# Patient Record
Sex: Male | Born: 1945 | Race: Black or African American | Hispanic: No | Marital: Married | Smoking: Former smoker
Health system: Southern US, Community
[De-identification: ages and names within clinical notes are randomized; demographics above are authoritative.]

## PROBLEM LIST (undated history)

## (undated) DIAGNOSIS — E663 Overweight: Secondary | ICD-10-CM

## (undated) DIAGNOSIS — N189 Chronic kidney disease, unspecified: Secondary | ICD-10-CM

## (undated) DIAGNOSIS — M109 Gout, unspecified: Secondary | ICD-10-CM

## (undated) DIAGNOSIS — I10 Essential (primary) hypertension: Secondary | ICD-10-CM

## (undated) DIAGNOSIS — M1990 Unspecified osteoarthritis, unspecified site: Secondary | ICD-10-CM

## (undated) DIAGNOSIS — E119 Type 2 diabetes mellitus without complications: Secondary | ICD-10-CM

## (undated) DIAGNOSIS — I428 Other cardiomyopathies: Secondary | ICD-10-CM

## (undated) DIAGNOSIS — E7800 Pure hypercholesterolemia, unspecified: Secondary | ICD-10-CM

## (undated) DIAGNOSIS — C801 Malignant (primary) neoplasm, unspecified: Secondary | ICD-10-CM

## (undated) HISTORY — DX: Pure hypercholesterolemia, unspecified: E78.00

## (undated) HISTORY — PX: HERNIA REPAIR: SHX51

## (undated) HISTORY — DX: Essential (primary) hypertension: I10

## (undated) HISTORY — DX: Other cardiomyopathies: I42.8

## (undated) HISTORY — DX: Overweight: E66.3

## (undated) HISTORY — DX: Gout, unspecified: M10.9

## (undated) HISTORY — DX: Type 2 diabetes mellitus without complications: E11.9

## (undated) HISTORY — PX: COLONOSCOPY: SHX174

## (undated) SURGERY — Surgical Case
Anesthesia: *Unknown

---

## 2003-05-02 ENCOUNTER — Encounter: Payer: Self-pay | Admitting: Cardiology

## 2003-05-14 ENCOUNTER — Ambulatory Visit (HOSPITAL_COMMUNITY): Admission: RE | Admit: 2003-05-14 | Discharge: 2003-05-15 | Payer: Self-pay | Admitting: Cardiology

## 2003-08-22 ENCOUNTER — Encounter: Payer: Self-pay | Admitting: Cardiology

## 2005-07-09 ENCOUNTER — Ambulatory Visit: Payer: Self-pay | Admitting: Cardiology

## 2005-07-19 ENCOUNTER — Ambulatory Visit: Payer: Self-pay | Admitting: Cardiology

## 2006-09-30 ENCOUNTER — Encounter: Payer: Self-pay | Admitting: Physician Assistant

## 2006-09-30 ENCOUNTER — Ambulatory Visit: Payer: Self-pay | Admitting: Cardiology

## 2006-10-17 ENCOUNTER — Ambulatory Visit: Payer: Self-pay | Admitting: Cardiology

## 2007-06-02 ENCOUNTER — Ambulatory Visit: Payer: Self-pay | Admitting: Cardiology

## 2008-08-05 ENCOUNTER — Encounter: Payer: Self-pay | Admitting: Cardiology

## 2008-09-20 ENCOUNTER — Ambulatory Visit: Payer: Self-pay | Admitting: Cardiology

## 2009-08-08 DIAGNOSIS — E663 Overweight: Secondary | ICD-10-CM | POA: Insufficient documentation

## 2009-08-08 DIAGNOSIS — I429 Cardiomyopathy, unspecified: Secondary | ICD-10-CM | POA: Insufficient documentation

## 2009-08-08 DIAGNOSIS — I10 Essential (primary) hypertension: Secondary | ICD-10-CM | POA: Insufficient documentation

## 2009-12-26 ENCOUNTER — Ambulatory Visit: Payer: Self-pay | Admitting: Cardiology

## 2009-12-26 DIAGNOSIS — E119 Type 2 diabetes mellitus without complications: Secondary | ICD-10-CM | POA: Insufficient documentation

## 2009-12-29 ENCOUNTER — Telehealth (INDEPENDENT_AMBULATORY_CARE_PROVIDER_SITE_OTHER): Payer: Self-pay | Admitting: *Deleted

## 2010-05-01 ENCOUNTER — Ambulatory Visit: Payer: Self-pay | Admitting: Cardiology

## 2010-12-08 NOTE — Assessment & Plan Note (Signed)
Summary: 6 MONTH FU   Visit Type:  Follow-up Primary Provider:  Manuella Ghazi   History of Present Illness: the patient is a 65 year old male with a history of nonischemic cardio myopathy. His ejection fraction in 2009 was 45%. He has had no heart failure exacerbations. He reports no orthopnea PND palpitations or syncope. He has lost 9 pounds. He denies any central chest pain both at rest and on exertion. He has followup scheduled with his primary care physician next month.  Preventive Screening-Counseling & Management  Alcohol-Tobacco     Smoking Status: quit     Year Quit: 1987  Current Medications (verified): 1)  Spironolactone 25 Mg Tabs (Spironolactone) .... Take 1 Tablet By Mouth Once A Day 2)  Amlodipine Besy-Benazepril Hcl 10-20 Mg Caps (Amlodipine Besy-Benazepril Hcl) .... Take 1 Capsule By Mouth Once A Day 3)  Bumetanide 1 Mg Tabs (Bumetanide) .... Take 1 Tablet By Mouth Twice A Day 4)  Carvedilol 25 Mg Tabs (Carvedilol) .... Take 1 Tablet By Mouth Twice A Day 5)  Metformin Hcl 500 Mg Tabs (Metformin Hcl) .... Take 1 Tablet By Mouth Two Times A Day 6)  Aspir-Low 81 Mg Tbec (Aspirin) .... Take 1 Tablet By Mouth Once A Day 7)  Advair Diskus 250-50 Mcg/dose Aepb (Fluticasone-Salmeterol) .... One Inhalation Two Times A Day 8)  Simvastatin 20 Mg Tabs (Simvastatin) .... Take 1 Tablet By Mouth Once A Day  Allergies (verified): No Known Drug Allergies  Comments:  Nurse/Medical Assistant: The patient's medication list and allergies were reviewed with the patient and were updated in the Medication and Allergy Lists.  Past History:  Past Medical History: Last updated: 12/26/2009 OVERWEIGHT/OBESITY (ICD-278.02) HYPERTENSION, UNSPECIFIED (ICD-401.9) CARDIOMYOPATHY, SECONDARY (ICD-425.9) Type 2 diabetes mellitus 1. Nonischemic cardiomyopathy.     a.     EF 40-50% by most recent 2-D echo.     b.     EF 25% by cardiac catheterization in 2004. 2. Hypertension. 3. Type 2 diabetes  mellitus. 4. Obesity  Family History: Last updated: 12/26/2009 [place]  Social History: Last updated: 08/08/2009 Full Time Married  Tobacco Use - Former.  Alcohol Use - no  Risk Factors: Smoking Status: quit (05/01/2010)  Vital Signs:  Patient profile:   65 year old male Height:      74 inches Weight:      309 pounds Pulse rate:   73 / minute BP sitting:   121 / 75  (left arm) Cuff size:   large  Vitals Entered By: Georgina Peer (May 01, 2010 1:49 PM)  Physical Exam  Additional Exam:  General: Well-developed, well-nourished in no distress head: Normocephalic and atraumatic eyes PERRLA/EOMI intact, conjunctiva and lids normal nose: No deformity or lesions mouth normal dentition, normal posterior pharynx neck: Supple, no JVD.  No masses, thyromegaly or abnormal cervical nodes lungs: Normal breath sounds bilaterally without wheezing.  Normal percussion heart: regular rate and rhythm with normal S1 and S2, no S3 or S4.  PMI is normal.  No pathological murmurs abdomen: Normal bowel sounds, abdomen is soft and nontender without masses, organomegaly or hernias noted.  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  Problem # 2:  HYPERTENSION, UNSPECIFIED (ICD-401.9) blood pressure is well controlled. I made no changes in his medical regimen. The following medications were removed from the medication list:    Lotrel 10-20 Mg Caps (Amlodipine besy-benazepril hcl) .Marland Kitchen... Take 1 tablet by mouth once a day His updated medication  list for this problem includes:    Spironolactone 25 Mg Tabs (Spironolactone) .Marland Kitchen... Take 1 tablet by mouth once a day    Amlodipine Besy-benazepril Hcl 10-20 Mg Caps (Amlodipine besy-benazepril hcl) .Marland Kitchen... Take 1 capsule by mouth once a day    Bumetanide 1 Mg Tabs (Bumetanide) .Marland Kitchen... Take 1 tablet by mouth twice a day    Carvedilol 25 Mg Tabs (Carvedilol) .Marland Kitchen... Take 1 tablet by mouth twice a day    Aspir-low 81 Mg Tbec (Aspirin) .Marland Kitchen... Take 1 tablet by mouth once a day  Problem # 3:  OVERWEIGHT/OBESITY (ICD-278.02) the patient lost 9 pounds.I encouraged her to weight reduction.  Problem # 4:  DM (ICD-250.00) Assessment: Comment Only  The following medications were removed from the medication list:    Lotrel 10-20 Mg Caps (Amlodipine besy-benazepril hcl) .Marland Kitchen... Take 1 tablet by mouth once a day His updated medication list for this problem includes:    Amlodipine Besy-benazepril Hcl 10-20 Mg Caps (Amlodipine besy-benazepril hcl) .Marland Kitchen... Take 1 capsule by mouth once a day    Metformin Hcl 500 Mg Tabs (Metformin hcl) .Marland Kitchen... Take 1 tablet by mouth two times a day    Aspir-low 81 Mg Tbec (Aspirin) .Marland Kitchen... Take 1 tablet by mouth once a day  Patient Instructions: 1)  Your physician recommends that you continue on your current medications as directed. Please refer to the Current Medication list given to you today. 2)  Follow up in  1 year Prescriptions: CARVEDILOL 25 MG TABS (CARVEDILOL) Take 1 tablet by mouth twice a day  #180 x 3   Entered by:   Lovina Reach, LPN   Authorized by:   Terald Sleeper, MD, [facility]   Signed by:   Lovina Reach, LPN on QA348G   Method used:   Electronically to        [facility] (retail)             ,          Ph: [phone]       Fax: [phone]   [id number] BUMETANIDE 1 MG TABS (BUMETANIDE) Take 1 tablet by mouth twice a day  #180 x 3   Entered by:   Lovina Reach, LPN   Authorized by:   Terald Sleeper, MD, [facility]   Signed by:   Lovina Reach, LPN on QA348G    Method used:   Electronically to        [facility] (retail)             ,          Ph: [phone]       Fax: [phone]   RxIDSG:[id number] AMLODIPINE BESY-BENAZEPRIL HCL 10-20 MG CAPS (AMLODIPINE BESY-BENAZEPRIL HCL) Take 1 capsule by mouth once a day  #90 x 3   Entered by:   Lovina Reach, LPN   Authorized by:   Terald Sleeper, MD, [facility]   Signed by:   Lovina Reach, LPN on QA348G   Method used:   Electronically to        [facility]* (retail)             ,  Ph: [phone]       Fax: [phone]   [id number] SPIRONOLACTONE 25 MG TABS (SPIRONOLACTONE) Take 1 tablet by mouth once a day  #90 x 3   Entered by:   Lovina Reach, LPN   Authorized by:   Terald Sleeper, MD, [facility]   Signed by:   Lovina Reach, LPN on QA348G   Method used:   Electronically to        [facility]* (retail)             ,          Ph: [phone]       Fax: [phone]   [id number]:[id number]

## 2010-12-08 NOTE — Assessment & Plan Note (Signed)
Summary: 1 yr fu recv letter -vs   Visit Type:  Follow-up Primary Provider:  Manuella Ghazi  CC:  follow-up visit.  History of Present Illness: the patient is a 65 year old male with a history of nonischemic cardiomyopathy. His most recent ejection fraction was 40-50% by echocardiogram in 2009. He currently denies any chest pain shortness of breath orthopnea PND he denies any palpitations or syncope. Blood work is being done by his primary care physician. The patient had a cardiac catheterization in 2004 which showed no coronary artery disease. From a cardiovascular standpoint is quite stable.  Clinical Review Panels:  Cardiac Imaging Cardiac Cath Findings  CONCLUSION:  1. Severe left ventricular dysfunction with ejection fraction of 25%.  2. Normal coronary angiography.   RECOMMENDATIONS:  The patient appears to have a nonischemic cardiomyopathy  with severe depression of left ventricular systolic function and markedly  elevated pulmonary wedge pressures.  Will plan medical treatment.  Will give  IV Lasix today and start him on p.o. Lasix and will plan to start him on  Coreg and continue the lisinopril.  We will arrange follow-up with Dr.  Dannielle Burn in the next couple of weeks.                                             Eustace Quail, M.D. (05/14/2003)    Preventive Screening-Counseling & Management  Alcohol-Tobacco     Smoking Status: quit     Year Quit: 1987  Current Medications (verified): 1)  Spironolactone 25 Mg Tabs (Spironolactone) .... Take 1 Tablet By Mouth Once A Day 2)  Amlodipine Besy-Benazepril Hcl 10-20 Mg Caps (Amlodipine Besy-Benazepril Hcl) .... Take 1 Capsule By Mouth Once A Day 3)  Bumetanide 1 Mg Tabs (Bumetanide) .... Take 1 Tablet By Mouth Twice A Day 4)  Carvedilol 25 Mg Tabs (Carvedilol) .... Take 1 Tablet By Mouth Twice A Day 5)  Metformin Hcl 500 Mg Tabs (Metformin Hcl) .... Take 1 Tablet By Mouth Two Times A Day 6)  Lotrel 10-20 Mg Caps (Amlodipine  Besy-Benazepril Hcl) .... Take 1 Tablet By Mouth Once A Day 7)  Aspir-Low 81 Mg Tbec (Aspirin) .... Take 1 Tablet By Mouth Once A Day 8)  Advair Diskus 250-50 Mcg/dose Aepb (Fluticasone-Salmeterol) .... One Inhalation Two Times A Day  Allergies (verified): No Known Drug Allergies  Comments:  Nurse/Medical Assistant: The patient is currently on medications but does not know the name or dosage at this time. Instructed to contact our office with details. Will update medication list at that time.  Past History:  Past Medical History: Last updated: 12/26/2009 OVERWEIGHT/OBESITY (ICD-278.02) HYPERTENSION, UNSPECIFIED (ICD-401.9) CARDIOMYOPATHY, SECONDARY (ICD-425.9) Type 2 diabetes mellitus 1. Nonischemic cardiomyopathy.     a.     EF 40-50% by most recent 2-D echo.     b.     EF 25% by cardiac catheterization in 2004. 2. Hypertension. 3. Type 2 diabetes mellitus. 4. Obesity  Family History: Last updated: 12/26/2009 [place]  Social History: Last updated: 08/08/2009 Full Time Married  Tobacco Use - Former.  Alcohol Use - no  Risk Factors: Smoking Status: quit (12/26/2009)  Review of Systems  The patient denies fatigue, malaise, fever, weight gain/loss, vision loss, decreased hearing, hoarseness, chest pain, palpitations, shortness of breath, prolonged cough, wheezing, sleep apnea, coughing up blood, abdominal pain, blood in stool, nausea, vomiting, diarrhea, heartburn, incontinence, blood in urine, muscle  weakness, joint pain, leg swelling, rash, skin lesions, headache, fainting, dizziness, depression, anxiety, enlarged lymph nodes, easy bruising or bleeding, and environmental allergies.    Vital Signs:  Patient profile:   65 year old male Height:      74 inches Weight:      318 pounds BMI:     40.98 O2 Sat:      95 % Pulse rate:   67 / minute BP sitting:   112 / 74  (left arm) Cuff size:   large  Vitals Entered By: Georgina Peer (December 26, 2009 8:32 AM)  Nutrition  Counseling: Patient's BMI is greater than 25 and therefore counseled on weight management options. CC: follow-up visit   Physical Exam  Additional Exam:  General: Well-developed, well-nourished in no distress head: Normocephalic and atraumatic eyes PERRLA/EOMI intact, conjunctiva and lids normal nose: No deformity or lesions mouth normal dentition, normal posterior pharynx neck: Supple, no JVD.  No masses, thyromegaly or abnormal cervical nodes lungs: Normal breath sounds bilaterally without wheezing.  Normal percussion heart: regular rate and rhythm with normal S1 and S2, no S3 or S4.  PMI is normal.  No pathological murmurs abdomen: Normal bowel sounds, abdomen is soft and nontender without masses, organomegaly or hernias noted.  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 He has no clinical symptoms of heart failure. Is alsoon a  optimal regimen for LV dysfunction. No definite indication to repeat an echocardiogram at this point.  Problem # 2:  CARDIOMYOPATHY, SECONDARY (ICD-425.9) the patient is well compensated. He is hemodynamically stable. His updated medication list for this problem includes:    Spironolactone 25 Mg Tabs (Spironolactone) .Marland Kitchen... Take 1 tablet by mouth once a day    Amlodipine Besy-benazepril Hcl 10-20 Mg Caps (Amlodipine besy-benazepril hcl) .Marland Kitchen... Take 1 capsule by mouth once a day    Bumetanide 1 Mg Tabs (Bumetanide) .Marland Kitchen... Take 1 tablet by mouth twice a day    Carvedilol 25 Mg Tabs  (Carvedilol) .Marland Kitchen... Take 1 tablet by mouth twice a day    Lotrel 10-20 Mg Caps (Amlodipine besy-benazepril hcl) .Marland Kitchen... Take 1 tablet by mouth once a day    Aspir-low 81 Mg Tbec (Aspirin) .Marland Kitchen... Take 1 tablet by mouth once a day  Orders: EKG w/ Interpretation (93000)  Problem # 3:  HYPERTENSION, UNSPECIFIED (ICD-401.9) blood pressure is well controlled. His updated medication list for this problem includes:    Spironolactone 25 Mg Tabs (Spironolactone) .Marland Kitchen... Take 1 tablet by mouth once a day    Amlodipine Besy-benazepril Hcl 10-20 Mg Caps (Amlodipine besy-benazepril hcl) .Marland Kitchen... Take 1 capsule by mouth once a day    Bumetanide 1 Mg Tabs (Bumetanide) .Marland Kitchen... Take 1 tablet by mouth twice a day    Carvedilol 25 Mg Tabs (Carvedilol) .Marland Kitchen... Take 1 tablet by mouth twice a day    Lotrel 10-20 Mg Caps (Amlodipine besy-benazepril hcl) .Marland Kitchen... Take 1 tablet by mouth once a day    Aspir-low 81 Mg Tbec (Aspirin) .Marland Kitchen... Take 1 tablet by mouth once a day  Patient Instructions: 1)  Your physician recommends that you continue on  your current medications as directed. Please refer to the Current Medication list given to you today. 2)  Follow up in  6 months.

## 2010-12-08 NOTE — Progress Notes (Signed)
Summary: request med for cold      Phone Note Call from Patient   Summary of Call: Stated he was here for OV on Friday and said you offered something for his cold.  Now wants rx called in for this.   Lovina Reach, LPN  February 21, 624THL 3:28 PM   Follow-up for Phone Call        Suggest amoxicilline 500mg  by mouth q12 if not pen-allergic otherwise Z-pack x 5 days. Take Zyrtec and Mucinex OTC.  Follow-up by: Terald Sleeper, MD, [facility],  December 29, 2009 8:12 PM  Additional Follow-up for Phone Call Additional follow up Details #1::        Left message to return call. Lovina Reach, LPN  February 22, 624THL 2:30 PM  Wife returned call.  Notified of above.  Will send med to [facility].  Additional Follow-up by: Lovina Reach, LPN,  February 22, 624THL 2:37 PM    New/Updated Medications: AMOXICILLIN 500 MG CAPS (AMOXICILLIN) Take 1 tablet by mouth two times a day AMOXICILLIN 500 MG CAPS (AMOXICILLIN) Take 1 tablet by mouth two times a day x 5 days (PLEASE CANCEL PREVIOUS SCRIPT FOR 10 DAYS) Prescriptions: AMOXICILLIN 500 MG CAPS (AMOXICILLIN) Take 1 tablet by mouth two times a day x 5 days (PLEASE CANCEL PREVIOUS SCRIPT FOR 10 DAYS)  #10 x 0   Entered by:   Lovina Reach, LPN   Authorized by:   Terald Sleeper, MD, [facility]   Signed by:   Lovina Reach, LPN on 624THL   Method used:   Electronically to        [facility] (retail)       [address], [place]  [address]       Ph: [phone]       Fax: [phone]   RxID:   [id number] AMOXICILLIN 500 MG CAPS (AMOXICILLIN) Take 1 tablet by mouth two times a day  #20 x 0   Entered by:   Lovina Reach, LPN   Authorized by:   Terald Sleeper, MD, [facility]   Signed by:   Lovina Reach, LPN on 624THL   Method used:   Electronically to        [facility]* (retail)       [address]       [place], [place]  [address]       Ph: [phone]       Fax: [phone]   RxID:   [id number]

## 2010-12-08 NOTE — Miscellaneous (Signed)
Summary: ECG  Clinical Lists Changes  Observations: Added new observation of EKG INTERP: normal sinus rhythm nonspecific T wave changes. Otherwise normal tracing with a heart rate of 67 beats per minute. (12/26/2009 9:46)      EKG  Procedure date:  12/26/2009  Findings:      normal sinus rhythm nonspecific T wave changes. Otherwise normal tracing with a heart rate of 67 beats per minute.

## 2011-03-23 NOTE — Assessment & Plan Note (Signed)
[facility]                          [facility] OFFICE NOTE   Todd Robertson, Todd Robertson                    MRN:          [id number]  DATE:09/20/2008                            DOB:          [birth year]    PRIMARY CARDIOLOGIST:  Ernestine Mcmurray, MD, [facility]   REASON FOR VISIT:  Annual followup.   Mr. Grimstead continues to do well clinically, with no interim  development of exertional angina pectoris or significant worsening of  his baseline exercise tolerance level.  He is limited in his ability to  exercise regularly, however, secondary to arthritis of the left knee.  Nevertheless, he can climb a flight of stairs with no significant  associated symptoms.   Interestingly, Mr. Sobieraj has recently noted that several hours after  eating some peanuts he develops right-sided chest discomfort.  The  symptoms resolve on their own and again are not associated with any  exertion.   The patient denies any PND, orthopnea, or worsening lower extremity  edema.  He has not smoked tobacco since 1986.   Mr. Poston reports that he had a recent physical, including extensive  blood work, but does not recall his cholesterol level.  There was some  discussion that he would need to consider treatment, given that he has  diabetes.   Recent 2-D echo, performed in Dr. Trena Platt office, suggested stable  cardiomyopathy with an EF of 40-50% and mild mitral regurgitation.  This  compares to his previous study in 2006, yielding an EF of 45%.   Recent office EKG indicates normal sinus rhythm at approximately 70 bpm.   CURRENT MEDICATIONS:  1. Spironolactone 25 daily.  2. Metformin 500 b.i.d.  3. Bumetanide 1 b.i.d.  4. Lotrel 10/20 daily.  5. Carvedilol 25 b.i.d.  6. Aspirin 81 daily.  7. Advair.   PHYSICAL EXAMINATION:  VITAL SIGNS:  Blood pressure 151/92, pulse 69,  and regular weight 318.8 (up 23).  GENERAL:  A 65 year old male, obese, sitting upright, and in no  distress.  HEENT:  Normocephalic and atraumatic.  NECK:  Palpable carotid pulses without bruits; unable to assess JVD,  secondary to neck girth.  LUNGS:  Clear to auscultation in all fields.  HEART:  Regular rate and rhythm.  No significant murmurs.  No rubs.  ABDOMEN:  Protuberant, nontender, and intact bowel sounds.  EXTREMITIES:  Palpable posterior tibialis pulses with 1+, nonpitting  edema.  NEURO:  No focal deficit.   IMPRESSION:  1. Nonischemic cardiomyopathy.      a.     EF 40-50% by most recent 2-D echo.      b.     EF 25% by cardiac catheterization in 2004.  2. Hypertension.  3. Type 2 diabetes mellitus.  4. Obesity.   PLAN:  1. The patient is strongly advised to engage in a regular exercise      program, such as water aerobics, to try to lose weight.  He is also      to be more careful about his diet, with respect to use of salt and      saturated fat.  With  respect to the latter, I did counsel him that      he should be on a cholesterol-lowering agent, and to review this      with Dr. Manuella Ghazi at time of his next scheduled followup.  2. Renew all cardiac prescription medications.  3. Schedule return clinic followup in 1 year with myself and Dr.      Dannielle Burn.      Gene Serpe, PA-C  Electronically Signed      Ernestine Mcmurray, MD,FACC  Electronically Signed   GS/MedQ  DD: 09/20/2008  DT: 09/21/2008  Job #: [id number]   cc:   Monico Blitz, MD

## 2011-03-23 NOTE — Assessment & Plan Note (Signed)
[facility]                          [facility]ICE NOTE   WALT, VIRANI                    MRN:          [id number]  DATE:06/02/2007                            DOB:          Mar 30, 1946    REFERRING PHYSICIANS:  Dr. Shearon Stalls and Dr. Manuella Ghazi   HISTORY OF PRESENT ILLNESS:  The patient is a 65 year old male with a  history of nonischemic cardiomyopathy and most recent ejection fraction  of 45%.  The patient is currently NYHA class I.  He is doing extremely  well.  His blood pressure is under control.  He has no evidence of  exercise intolerance.  He denies any chest pain, shortness of breath.  He stated he felt the best in a long time.  He also lost a significant  amount of weight and is down to 295 pounds.   MEDICATIONS:  1. Advair inhaler.  2. Metformin 500 b.i.d.  3. Bumetanide 20 mg p.o. b.i.d.  4. Aspirin 81 mg p.o. daily.  5. Spironolactone 25 mg p.o. daily.  6. Lotrel 10/20 p.o. daily.  7. Coreg 25 mg p.o. b.i.d.  8. Vitamin E.  9. Multivitamin.   PHYSICAL EXAMINATION:  VITAL SIGNS:  Blood pressure 140/81, heart rate  72 beats per minute, weight 295 pounds.  NECK:  Normal carotid upstroke.  No carotid bruits.  LUNGS:  Clear breath sounds bilaterally.  HEART:  Regular rate and rhythm.  Normal S1, S2.  No murmurs, rubs, or  gallops.  ABDOMEN:  Soft and nontender.  No rebound or guarding.  Good bowel  sounds.  EXTREMITIES:  No cyanosis, clubbing, or edema.   PROBLEM LIST:  1. Nonischemic cardiomyopathy.      a.     New York Heart Association class I.      b.     Ejection fraction 45%.  2. Hypertension, controlled.  3. Diabetes mellitus.  4. Obesity.   PLAN:  1. The patient is doing extremely well.  His blood pressure is finally      under control.  2. He is currently NYHA class I and, given his last ejection fraction      of 45% I do not think it is      necessary to repeat an echographic study at the present time.   We      will follow up with the patient in 1 year.     Ernestine Mcmurray, MD,FACC  Electronically Signed    GED/MedQ  DD: 06/02/2007  DT: 06/03/2007  Job #: [id number]   cc:   Dr. Shearon Stalls  Dr. Manuella Ghazi

## 2011-03-26 NOTE — Assessment & Plan Note (Signed)
[facility]                            [facility] OFFICE NOTE   Todd, Robertson                    MRN:          [id number]  DATE:09/30/2006                            DOB:          [birth year]    PRIMARY CARDIOLOGIST:  Ernestine Mcmurray, MD, F.A.C.C.   REASON FOR OFFICE VISIT:  Physical and scheduled annual followup.   HISTORY OF PRESENT ILLNESS:  Mr. Todd Robertson is a 65 year old male with history  of non-ischemic cardiomyopathy with initial ejection fraction of  approximately 30% by echocardiogram in 2004, improved to approximately 45%  via followup echocardiogram in December 2006.   The patient has multiple cardiac risk factors, but no known history of  coronary artery disease.   The patient returns with no interim development of symptoms suggestive of  unstable angina pectoris or worsening exertional dyspnea, PND, orthopnea, or  significant lower extremity edema.  In fact, the patient states that he is  having no problems, whatsoever.   Electrocardiogram today reveals an SR at 81 BPM with normal axis,  nonspecific ST abnormalities and a single PVC.   CURRENT MEDICATIONS:  1. Aspirin 81 daily.  2. Coreg 12.5 b.i.d.  3. Lotrel 10/20 daily.  4. Spironolactone 25 daily.  5. Bumetanide 1 b.i.d.  6. Metformin 500 b.i.d.  7. Advair 250/50 b.i.d.   PHYSICAL EXAMINATION:  VITAL SIGNS:  Blood pressure 168/90, pulse 81,  regular, weight 316 (down 7 pounds).  GENERAL:  The patient is a 65 year old male, obese, sitting upright, in no  distress.  HEENT:  Normocephalic, atraumatic.  NECK:  Palpable bilateral carotid pulses, without bruits.  Unable to assess  JVD, secondary to neck girth.  LUNGS:  Clear to auscultation in all fields.  HEART:  Regular rate and rhythm (S1, S2).  Soft S4.  No significant murmurs.  ABDOMEN:  Protuberant, nontender.  EXTREMITIES:  2+, nonpitting lower extremity edema.  NEUROLOGIC:  No focal deficits.   IMPRESSION:  1. Nonischemic cardiomyopathy.      a.     NYHA Class I.      b.     Ejection fraction improved from approximately 30% to 45% by       echocardiogram, September 2006.  2. Hypertension, currently uncontrolled.  3. Type 2 diabetes mellitus.  4. Obesity.   PLAN:  1. Increase Coreg to 18.75 b.i.d. with goal of 25 b.i.d.  Of note, I did      attempt this when I last saw him here in the clinic in October 2005.      He tells me today, however, that he was unable to tolerate this and      felt much better after it had been decreased back to the original dose.      However, he is willing to re-attempt this today and we will have him      return in two weeks for close followup and a repeat blood pressure      check.  2. The patient is currently not on a statin.  He has type 2 diabetes      mellitus and this  should be considered.  We will address this issue      upon his return in two weeks.      Gene Serpe, PA-C  Electronically Signed      Ernestine Mcmurray, MD,FACC  Electronically Signed   GS/MedQ  DD: 09/30/2006  DT: 09/30/2006  Job #: [id number]   cc:   Florestine Avers, Dr.

## 2011-03-26 NOTE — Discharge Summary (Signed)
NAMEZAKARIYAH, DEAMER                         ACCOUNT NO.:  [id number]   MEDICAL RECORD NO.:  [id number]                   PATIENT TYPE:  OIB   LOCATION:  [id number]                                 FACILITY:  [facility]   PHYSICIAN:  Ernestine Mcmurray, M.D.                 DATE OF BIRTH:  1946/09/02   DATE OF ADMISSION:  05/14/2003  DATE OF DISCHARGE:  05/15/2003                           DISCHARGE SUMMARY - REFERRING   PROCEDURES:  Coronary angiogram on May 15, 2003.   REASON FOR ADMISSION:  Mr. Groeneweg is a 65 year old male with no known  history of coronary artery disease, recently referred to Dr. Dannielle Burn for  evaluation of cardiomyopathy with severe left ventricular dysfunction.  The  recommendation was to proceed with left/right coronary angiography.  Please  refer to Dr. Arlina Robes admission note for full details.   LABORATORY DATA:  Normal CBC.  Sodium 139, potassium 3.3, glucose 113, BUN  11, creatinine 1.0 at discharge.   HOSPITAL COURSE:  The patient underwent scheduled elective cardiac  catheterization, performed by Eustace Quail, M.D. (see report for full  details), revealing trivial atherosclerosis in the LAD with otherwise normal  circumflex and right coronary arteries.  Left ventriculogram revealed severe  global hypokinesis with an estimated ejection fraction of 25%.   The distal aortogram revealed no significant atherosclerosis.   Dr. Olevia Perches concluded that this represented severe nonischemic  cardiomyopathy.  Wedge pressure was 35.  The patient was treated with 40 mg  IV Lasix post procedure.  Dr. Olevia Perches also recommended the addition of Coreg  6.25 mg b.i.d. and up titration of Bumex to 1 mg b.i.d.   The patient was kept for overnight observation and cleared for discharge the  following morning in hemodynamically stable condition.  Of note, blood  pressures were in the 130-150 range on the morning of discharge.   The patient was treated with supplemental potassium prior to  discharge.  He  will need a followup BMET in one week.   Of note, the patient had apparently been placed on Norvasc at the time of  his last office visit.  However, he was not on this medication during his  brief stay.  Given that he was started on Coreg and had up titration of  Bumex, we will defer resumption of Norvasc until office followup and  reassessment of his blood pressure.   DISCHARGE MEDICATIONS:  1. Coreg 6.25 mg t.i.d.  2. Bumex 1 mg b.i.d.  3. K-Dur 20 mEq daily.  4. Metformin 5 mg b.i.d.  5. Aspirin 81 mg daily.  6. Lisinopril 40 mg daily.  7. Avandia 4 mg daily.  8. Spiriva 18 mg b.i.d.  9. Advair 250/50 mg b.i.d.   ACTIVITY:  No heavy lifting/driving x 2 days.  The patient is cleared to  return to work on Monday, May 20, 2003.   DIET:  Maintain a low-fat/cholesterol diet.   WOUND CARE:  Call the office if there is any swelling/bleeding of the groin.   FOLLOWUP:  The patient is scheduled to follow up with Ernestine Mcmurray, M.D.,  at the [facility] on Friday, May 31, 2003, at 9:45 a.m.  He is scheduled  for a followup BMET on Thursday, May 23, 2003, at 8:30 a.m.   DISCHARGE DIAGNOSES:  1. Ischemic cardiomyopathy.     a. Status post coronary angiogram on May 14, 2003.     b. Severe left ventricular dysfunction (ejection fraction approximately        25%).  2. Hypertension.  3. Type 2 diabetes mellitus.  4. Hypokalemia.  5. Asthmatic bronchitis.  6. Obesity.     Gene Serpe, P.A. [facility]                      Ernestine Mcmurray, M.D.    GS/MEDQ  D:  05/15/2003  T:  05/15/2003  Job:  [id number]   cc:   Langley Gauss, M.D.    cc:   Langley Gauss, M.D.

## 2011-03-26 NOTE — Assessment & Plan Note (Signed)
[place] OFFICE NOTE   LECIL, MOFFA                    MRN:          [id number]  DATE:10/17/2006                            DOB:          1946/10/19    REFERRING PHYSICIAN:  Shearon Stalls   HISTORY OF PRESENT ILLNESS:  The patient is a 65 year old African-  American male with nonischemic cardiomyopathy ejection fraction 45%.  The patient was seen by Everlene Balls on September 30, 2006.  He  reported no symptoms.  He reported no shortness of breath.  He was NYHA  class I.  He was found to have controlled Uncontrolled hypertension, and  Coreg was increased to 18.75 b.i.d.  The patient reports symptoms  slightly better controlled, but still not optimal.  He has  eaten quite  a bit of salted pork over the weekend.   MEDICATIONS:  Advair inhaler, metformin 500 b.i.d., bumetanide 20 mg  p.o. b.i.d., aspirin 81 mg p.o. daily, spironolactone 25 daily, Lotrel  10/20 daily, Coreg 18.25 b.i.d.   PHYSICAL EXAMINATION:  VITAL SIGNS:  Blood pressure is 158/88, pulse  rate is 75 beats per minute.  HEENT No carotid bruits.  LUNGS:  Clear breath sounds bilaterally.  HEART:  Regular rate and rhythm with normal S1, S2.  No murmurs, rubs,  or gallops.  ABDOMEN:  Soft, nontender.  No rebound or guarding.  Good bowel sounds.  EXTREMITIES:  No edema.   PROBLEM LIST:  1. Nonischemic cardiomyopathy.      a.     New York Heart Association Class I.      b.     Ejection fraction 45%.  2. Uncontrolled hypertension.  3. Diabetes mellitus.  4. Obesity.   PLAN:  1. The patient's Coreg will be further increased to 25 b.i.d.  I wrote      him a new prescription.  2. The patient will also need to be started on Statin drug therapy and      this can be addressed by his primary care physician.  3. The patient will followup with [place] in 6 months.     Ernestine Mcmurray, MD,FACC  Electronically Signed    GED/MedQ  DD: 10/17/2006   DT: 10/17/2006  Job #: [id number]

## 2011-03-26 NOTE — Cardiovascular Report (Signed)
NAME:  Todd Robertson, Todd Robertson NO.:  [id number]   MEDICAL RECORD NO.:  [id number]                   PATIENT TYPE:  OIB   LOCATION:  2899                                 FACILITY:  [facility]   PHYSICIAN:  Eustace Quail, M.D.                  DATE OF BIRTH:  1946/03/28   DATE OF PROCEDURE:  05/14/2003  DATE OF DISCHARGE:                              CARDIAC CATHETERIZATION   CLINICAL HISTORY:  Mr. Cocker is 65 years old and has a history of  hypertension and recently has had symptoms of shortness of breath especially  with exertion.  He had an echocardiogram performed by Dr. Dannielle Burn, which  showed an ejection fraction of about 25%, and arrangements were made for him  to come into the hospital for evaluation with right and left heart  catheterization.   PROCEDURE:  Right heart catheterization was performed percutaneously via the  right femoral vein using a venous sheath and Swan-Ganz thermodilution  catheter.  Left heart catheterization was performed percutaneously via the  right femoral artery using an arterial sheath and 6 French preformed  coronary catheters.  A front wall arterial puncture was performed and  Omnipaque contrast was used.  A distal aortogram was performed to rule out  renovascular causes for hypertension.  The patient tolerated the procedure  well and left the laboratory in satisfactory condition.   RESULTS:   HEMODYNAMIC DATA:  1. The right atrial pressure was 20 mean.  2. The pulmonary artery pressure was 61/32 with a mean of 48.  3. The pulmonary wedge pressure was 35.  4. Left ventricular pressure was 145/31 and the aortic pressure was 145/105     with a mean of 127.   ANGIOGRAPHIC DATA:  1. Left main coronary artery:  The left main coronary artery is free of     significant disease.  2. Left anterior descending artery:  The left anterior descending gave rise     to two large diagonal branches, three septal perforators, and a small   diagonal branch.  The LAD was irregular.  There was no significant     obstruction.  3. Circumflex artery:  The circumflex artery gave rise to two very small     marginal branches and a posterolateral branch.  These vessels were free     of significant disease.  4. Right coronary artery:  The right coronary artery is a moderately large     vessel that gave rise to a conus branch and a right ventricular branch, a     posterior descending branch, and two posterolateral branches.  These     vessels were free of significant disease.  5. Left ventriculogram:  The left ventriculogram performed the RAO     projection showed global hypokinesis with an estimated ejection fraction     of 25%.  There was not any definite mitral regurgitation visualized.  6. Distal aortogram:  A distal aortogram was performed, which showed patent     renal arteries but no significant aortoiliac obstruction.   CONCLUSION:  1. Severe left ventricular dysfunction with ejection fraction of 25%.  2. Normal coronary angiography.   RECOMMENDATIONS:  The patient appears to have a nonischemic cardiomyopathy  with severe depression of left ventricular systolic function and markedly  elevated pulmonary wedge pressures.  Will plan medical treatment.  Will give  IV Lasix today and start him on p.o. Lasix and will plan to start him on  Coreg and continue the lisinopril.  We will arrange follow-up with Dr.  Dannielle Burn in the next couple of weeks.                                                  Eustace Quail, M.D.    BB/MEDQ  D:  05/14/2003  T:  05/15/2003  Job:  [id number]  Langley Gauss, M.D.   Ernestine Mcmurray, M.D.  [address]  [address]  [place]  [place] [address]   [facility]   Cardiopulmonary Lab   cc:   Langley Gauss, M.D.   Ernestine Mcmurray, M.D.  [address] [place] [address]   [facility]   Cardiopulmonary Lab

## 2011-05-05 ENCOUNTER — Other Ambulatory Visit: Payer: Self-pay | Admitting: Cardiology

## 2011-05-24 ENCOUNTER — Other Ambulatory Visit: Payer: Self-pay | Admitting: *Deleted

## 2011-05-24 MED ORDER — CARVEDILOL 25 MG PO TABS: 25.0000 mg | ORAL_TABLET | Freq: Two times a day (BID) | ORAL | Status: DC

## 2011-06-09 ENCOUNTER — Encounter: Payer: Self-pay | Admitting: Cardiology

## 2011-06-18 ENCOUNTER — Encounter: Payer: Self-pay | Admitting: Cardiology

## 2011-06-18 ENCOUNTER — Ambulatory Visit (INDEPENDENT_AMBULATORY_CARE_PROVIDER_SITE_OTHER): Payer: Commercial Indemnity | Admitting: Cardiology

## 2011-06-18 VITALS — BP 125/72 | HR 75 | Resp 18 | Ht 74.0 in | Wt 316.8 lb

## 2011-06-18 DIAGNOSIS — I2510 Atherosclerotic heart disease of native coronary artery without angina pectoris: Secondary | ICD-10-CM

## 2011-06-18 DIAGNOSIS — E663 Overweight: Secondary | ICD-10-CM

## 2011-06-18 DIAGNOSIS — I10 Essential (primary) hypertension: Secondary | ICD-10-CM

## 2011-06-19 NOTE — Progress Notes (Signed)
HPI The patient is a 65 year old male with a history of nonischemic cardiomyopathy. Apparently the patient had an echo done proximally year ago his primary care physician's office but we do not have the results available. He has had an ejection fraction as low as 25% by catheterization 2004 after medical treatment his most recent ejection fraction on record from 2 years ago showed an ejection fraction of 40-50%. The patient has been doing very well. He denies any shortness of breath orthopnea PND. He is in NYHA class 1/2. He denies any palpitations or syncope. He also gets regular blood work done by his primary care physician. He has essentially no cardiovascular complaints.  No Known Allergies  Current Outpatient Prescriptions on File Prior to Visit  Medication Sig Dispense Refill  . amLODipine-benazepril (LOTREL) 10-20 MG per capsule Take 1 capsule by mouth daily. PATIENT NEED OFFICE VISIT  90 capsule  0  . aspirin (ASPIR-LOW) 81 MG EC tablet Take 81 mg by mouth daily.        . bumetanide (BUMEX) 1 MG tablet Take 1 mg by mouth 2 (two) times daily.        . carvedilol (COREG) 25 MG tablet Take 1 tablet (25 mg total) by mouth 2 (two) times daily.  180 tablet  0  . Fluticasone-Salmeterol (ADVAIR DISKUS) 250-50 MCG/DOSE AEPB Inhale 1 puff into the lungs every 12 (twelve) hours.        . metFORMIN (GLUMETZA) 500 MG (MOD) 24 hr tablet Take 500 mg by mouth 2 (two) times daily.        . simvastatin (ZOCOR) 20 MG tablet Take 20 mg by mouth daily.        Marland Kitchen spironolactone (ALDACTONE) 25 MG tablet Take 25 mg by mouth daily.          Past Medical History  Diagnosis Date  . Overweight   . HTN (hypertension)     unspec  . Cardiomyopathy     secondary  . DM2 (diabetes mellitus, type 2)   . Nonischemic cardiomyopathy     a. EF 40-50% by most recent 2-D echo b. EF 25% by cardiac cath in 2004    No past surgical history on file.  No family history on file.  History   Social History  . Marital  Status: Married    Spouse Name: N/A    Number of Children: N/A  . Years of Education: N/A   Occupational History  . Not on file.   Social History Main Topics  . Smoking status: Former Research scientist (life sciences)  . Smokeless tobacco: Not on file  . Alcohol Use: No  . Drug Use:   . Sexually Active:    Other Topics Concern  . Not on file   Social History Narrative   Full time.    ROS:18  PHYSICAL EXAM BP 125/72  Pulse 75  Resp 18  Ht 6\' 2"  (1.88 m)  Wt 316 lb 12.8 oz (143.7 kg)  BMI 40.67 kg/m2  SpO2 94%  General: Well-developed, well-nourished in no distress Head: Normocephalic and atraumatic Eyes:PERRLA/EOMI intact, conjunctiva and lids normal Ears: No deformity or lesions Mouth:normal dentition, normal posterior pharynx Neck: Supple, no JVD.  No masses, thyromegaly or abnormal cervical nodes Lungs: Normal breath sounds bilaterally without wheezing.  Normal percussion Cardiac: regular rate and rhythm with normal S1 and S2, no S3 or S4.  PMI is normal.  No pathological murmurs Abdomen: Normal bowel sounds, abdomen is soft and nontender without masses, organomegaly or hernias noted.  No hepatosplenomegaly MSK: Back normal, normal gait muscle strength and tone normal Vascular: Pulse is normal in all 4 extremities Extremities: No peripheral pitting edema Neurologic: Alert and oriented x 3 Skin: Intact without lesions or rashes Lymphatics: No significant adenopathy Psychologic: Normal affect  ECG: Not available  ASSESSMENT AND PLAN

## 2011-06-19 NOTE — Assessment & Plan Note (Signed)
The patient has been instructed to acute therapeutic lifestyle changes.

## 2011-06-19 NOTE — Assessment & Plan Note (Signed)
Ejection fraction stable per report from his primary care physician. The patient has a nonischemic cardiomyopathy he has required no hospitalizations. Is on a good heart failure medical regimen and will make no changes in his medical regimen.

## 2011-06-19 NOTE — Assessment & Plan Note (Signed)
Blood pressures well controlled. No further adjustments are required.

## 2011-07-09 ENCOUNTER — Other Ambulatory Visit: Payer: Self-pay | Admitting: Cardiology

## 2011-07-11 ENCOUNTER — Other Ambulatory Visit: Payer: Self-pay | Admitting: Cardiology

## 2011-07-13 NOTE — Telephone Encounter (Signed)
[facility] pt. 

## 2011-07-13 NOTE — Telephone Encounter (Signed)
**Note De-identified Jolyne Laye Obfuscation** [facility] pt. 

## 2011-08-22 ENCOUNTER — Other Ambulatory Visit: Payer: Self-pay | Admitting: Cardiology

## 2012-03-12 ENCOUNTER — Other Ambulatory Visit: Payer: Self-pay | Admitting: Cardiology

## 2012-06-19 ENCOUNTER — Ambulatory Visit (INDEPENDENT_AMBULATORY_CARE_PROVIDER_SITE_OTHER): Payer: Commercial Indemnity | Admitting: Cardiology

## 2012-06-19 VITALS — BP 115/78 | HR 73 | Ht 74.0 in | Wt 311.5 lb

## 2012-06-19 DIAGNOSIS — E663 Overweight: Secondary | ICD-10-CM

## 2012-06-19 DIAGNOSIS — I429 Cardiomyopathy, unspecified: Secondary | ICD-10-CM

## 2012-06-19 DIAGNOSIS — I10 Essential (primary) hypertension: Secondary | ICD-10-CM

## 2012-06-19 NOTE — Assessment & Plan Note (Signed)
No heart failure symptoms. No hospitalizations. NYHA class 1-2. Patient has some mild edema likely secondary to dependent edema. Also possibly secondary to amlodipine. Clinically no heart failure

## 2012-06-19 NOTE — Patient Instructions (Addendum)
Your physician recommends that you schedule a follow-up appointment in: 1 year with [facility]. You will receive a reminder letter in the mail in about 10 months reminding you to call and schedule your appointment. If you don't receive this letter, please contact our office.  Your physician recommends that you continue on your current medications as directed. Please refer to the Current Medication list given to you today.

## 2012-06-19 NOTE — Assessment & Plan Note (Signed)
Blood pressure well controlled on her current medical regimen. I made no changes

## 2012-06-19 NOTE — Assessment & Plan Note (Signed)
I encouraged the patient to try to lose some weight. Also advised a low carbohydrate diet.

## 2012-06-19 NOTE — Progress Notes (Signed)
Todd Real, MD, [facility] ABIM Board Certified in Adult Cardiovascular Medicine,Internal Medicine and Critical Care Medicine    CC:  Follow patient with nonischemic cardiomyopathy                                                                                HPI:       The patient is a 66 year old male with a history of nonischemic cardiomyopathy. Last ejection fraction is 40-50%. He has not required any hospitalizations. He reports no orthopnea PND. Although his overweight he's very active and is in NYHA class 1/2.  The patient denies any palpitations presyncope or syncope. He reports no other cardiovascular problems. He does not report any symptoms that are consistent with obstructive sleep apnea  PMH: reviewed and listed in Problem List in Electronic Records (and see below) Past Medical History  Diagnosis Date  . Overweight   . HTN (hypertension)     unspec  . Cardiomyopathy     secondary  . DM2 (diabetes mellitus, type 2)   . Nonischemic cardiomyopathy     a. EF 40-50% by most recent 2-D echo b. EF 25% by cardiac cath in 2004   No past surgical history on file.  Allergies/SH/FHX : available in Electronic Records for review  No Known Allergies History   Social History  . Marital Status: Married    Spouse Name: N/A    Number of Children: N/A  . Years of Education: N/A   Occupational History  . Not on file.   Social History Main Topics  . Smoking status: Former Research scientist (life sciences)  . Smokeless tobacco: Not on file  . Alcohol Use: No  . Drug Use:   . Sexually Active:    Other Topics Concern  . Not on file   Social History Narrative   Full time.    No family history on file.  Medications: Current Outpatient Prescriptions  Medication Sig Dispense Refill  . amLODipine-benazepril (LOTREL) 10-20 MG per capsule TAKE 1 CAPSULE DAILY (NEEDS OFFICE VISIT)  90 capsule  1  . aspirin (ASPIR-LOW) 81 MG EC tablet Take 81 mg by mouth daily.        . bumetanide (BUMEX) 1 MG tablet TAKE  1 TABLET TWICE A DAY  180 tablet  2  . carvedilol (COREG) 25 MG tablet TAKE 1 TABLET TWICE A DAY  180 tablet  3  . Fluticasone-Salmeterol (ADVAIR DISKUS) 250-50 MCG/DOSE AEPB Inhale 1 puff into the lungs every 12 (twelve) hours.        . metFORMIN (GLUMETZA) 500 MG (MOD) 24 hr tablet Take 500 mg by mouth 2 (two) times daily.        . simvastatin (ZOCOR) 20 MG tablet Take 20 mg by mouth daily.        Marland Kitchen spironolactone (ALDACTONE) 25 MG tablet TAKE 1 TABLET ONCE DAILY  90 tablet  1    ROS: No nausea or vomiting. No fever or chills.No melena or hematochezia.No bleeding.No claudication  Physical Exam: BP 115/78  Pulse 73  Ht 6\' 2"  (1.88 m)  Wt 311 lb 8 oz (141.295 kg)  BMI 39.99 kg/m2 General: Overweight African American male in no distress. Neck: Normal  carotid upstroke no carotid bruits. No thyromegaly nonnodular thyroid. JVP is approximately 5-7 cm Lungs: Clear breath sounds bilaterally. No wheezing Cardiac: Regular rate and rhythm with normal S1-S2 with no murmur rubs or gallops Vascular: No edema. Normal distal pulses Skin: Warm and dry Physcologic: Normal affect  12lead ECG: Normal sinus rhythm no acute ischemic changes Limited bedside ECHO:N/A No images are attached to the encounter.   I reviewed and summarized the old records. I reviewed ECG and prior blood work.  Assessment and Plan  CARDIOMYOPATHY, SECONDARY No heart failure symptoms. No hospitalizations. NYHA class 1-2. Patient has some mild edema likely secondary to dependent edema. Also possibly secondary to amlodipine. Clinically no heart failure  HYPERTENSION, UNSPECIFIED Blood pressure well controlled on her current medical regimen. I made no changes  OVERWEIGHT/OBESITY I encouraged the patient to try to lose some weight. Also advised a low carbohydrate diet.    Patient Active Problem List  Diagnosis  . DM  . OVERWEIGHT/OBESITY  . HYPERTENSION, UNSPECIFIED  . CARDIOMYOPATHY, SECONDARY

## 2012-06-22 ENCOUNTER — Other Ambulatory Visit: Payer: Self-pay | Admitting: Cardiology

## 2012-08-17 ENCOUNTER — Other Ambulatory Visit: Payer: Self-pay | Admitting: Cardiology

## 2012-10-11 ENCOUNTER — Other Ambulatory Visit: Payer: Self-pay | Admitting: Cardiology

## 2013-06-30 ENCOUNTER — Other Ambulatory Visit: Payer: Self-pay | Admitting: Physician Assistant

## 2013-08-31 ENCOUNTER — Ambulatory Visit (INDEPENDENT_AMBULATORY_CARE_PROVIDER_SITE_OTHER): Payer: Commercial Indemnity | Admitting: Cardiology

## 2013-08-31 VITALS — BP 133/77 | HR 68 | Ht 74.0 in | Wt 300.8 lb

## 2013-08-31 DIAGNOSIS — I10 Essential (primary) hypertension: Secondary | ICD-10-CM

## 2013-08-31 DIAGNOSIS — I428 Other cardiomyopathies: Secondary | ICD-10-CM

## 2013-08-31 NOTE — Progress Notes (Signed)
Clinical Summary Mr. Mesecher is a 67 y.o.male seen today for regular follow up for the following problems.   1. NICM - previously significant LV dysfunction with LVEF 25-30% in 2004, last echo 2011 shows normalized LVEF at 60%. Cath 2004 with no significant coronary disease  - denies any SOB, no DOE, no chest pain, no orthopnea, no PND, no LE edema - compliant with medications: coreg, benazepril, spironlactone, bumex. Denies any lightheadedness of dizziness - limiting sodium intake, not taking NSAID  2. HTN - checks at home once a month. 130s/70 - compliant with meds  3. HL - no recent panel on our system, managed by his PCP. Has follow up next week.  - compliant with simvastatin.   Past Medical History  Diagnosis Date  . Overweight   . HTN (hypertension)     unspec  . Cardiomyopathy     secondary  . DM2 (diabetes mellitus, type 2)   . Nonischemic cardiomyopathy     a. EF 40-50% by most recent 2-D echo b. EF 25% by cardiac cath in 2004     No Known Allergies   Current Outpatient Prescriptions  Medication Sig Dispense Refill  . amLODipine-benazepril (LOTREL) 10-20 MG per capsule TAKE 1 CAPSULE DAILY (NEEDS OFFICE VISIT)  90 capsule  1  . aspirin (ASPIR-LOW) 81 MG EC tablet Take 81 mg by mouth daily.        . bumetanide (BUMEX) 1 MG tablet TAKE 1 TABLET TWICE A DAY  180 tablet  1  . carvedilol (COREG) 25 MG tablet TAKE 1 TABLET TWICE A DAY  180 tablet  0  . Fluticasone-Salmeterol (ADVAIR DISKUS) 250-50 MCG/DOSE AEPB Inhale 1 puff into the lungs every 12 (twelve) hours.        . metFORMIN (GLUMETZA) 500 MG (MOD) 24 hr tablet Take 500 mg by mouth 2 (two) times daily.        . simvastatin (ZOCOR) 20 MG tablet Take 20 mg by mouth daily.        Marland Kitchen spironolactone (ALDACTONE) 25 MG tablet TAKE 1 TABLET ONCE DAILY  90 tablet  0   No current facility-administered medications for this visit.     No past surgical history on file.   No Known Allergies    No family  history on file.   Social History Mr. Ruston reports that he has quit smoking. He does not have any smokeless tobacco history on file. Mr. Lyu reports that he does not drink alcohol.   Review of Systems CONSTITUTIONAL: No weight loss, fever, chills, weakness or fatigue.  HEENT: Eyes: No visual loss, blurred vision, double vision or yellow sclerae.No hearing loss, sneezing, congestion, runny nose or sore throat.  SKIN: No rash or itching.  CARDIOVASCULAR: per HPI RESPIRATORY: No shortness of breath, cough or sputum.  GASTROINTESTINAL: No anorexia, nausea, vomiting or diarrhea. No abdominal pain or blood.  GENITOURINARY: No burning on urination, no polyuria NEUROLOGICAL: No headache, dizziness, syncope, paralysis, ataxia, numbness or tingling in the extremities. No change in bowel or bladder control.  MUSCULOSKELETAL: No muscle, back pain, joint pain or stiffness.  LYMPHATICS: No enlarged nodes. No history of splenectomy.  PSYCHIATRIC: No history of depression or anxiety.  ENDOCRINOLOGIC: No reports of sweating, cold or heat intolerance. No polyuria or polydipsia.  Marland Kitchen   Physical Examination p 68 bp 133/77 Wt 300 lbs BMI 38 Gen: resting comfortably, no acute distress HEENT: no scleral icterus, pupils equal round and reactive, no palptable cervical adenopathy,  CV: RRR, no m/r/g, no JVD, no carotid bruits Resp: Clear to auscultation bilaterally GI: abdomen is soft, non-tender, non-distended, normal bowel sounds, no hepatosplenomegaly MSK: extremities are warm, no edema.  Skin: warm, no rash Neuro:  no focal deficits Psych: appropriate affect   Diagnostic Studies 05/2010 Echo: Normal LV systolic function 123456, mod LAE, mild LVH.   08/31/13 Clinic EKG: sinus rhythm, 1st degree AV block  Assessment and Plan  1. NICM - previously significant LV systolic dysfunction, that has since normalized on medical therapy - denies any significant symptoms, he appears euvolemic on exam  today - continue current medications  2. HTN - blood pressure right at goal given his history of diabetes - continue current medications  3. HL - followed by his PCP, no recent panel on our system - compliant with statin, will defer management to his primary care doctor   Follow up 1 year    Arnoldo Lenis, M.D., F.A.C.C.

## 2013-08-31 NOTE — Patient Instructions (Signed)
Your physician recommends that you schedule a follow-up appointment in: 1 year with Dr. Harl Bowie. You should receive a letter in the mail in 10 months. If you do not receive this letter by August 2015 call our office to schedule this appointment.   Your physician recommends that you continue on your current medications as directed. Please refer to the Current Medication list given to you today.

## 2013-09-28 ENCOUNTER — Other Ambulatory Visit: Payer: Self-pay | Admitting: Physician Assistant

## 2014-08-10 ENCOUNTER — Other Ambulatory Visit: Payer: Self-pay | Admitting: Cardiology

## 2014-08-12 ENCOUNTER — Telehealth: Payer: Self-pay | Admitting: *Deleted

## 2014-08-12 NOTE — Telephone Encounter (Signed)
Patient is due for f/u Left message to see if patient could come in this week.

## 2014-08-12 NOTE — Telephone Encounter (Signed)
Left message

## 2014-08-13 NOTE — Telephone Encounter (Signed)
WIFE WILL HAVE PATIENT CALL BACK TO SCHEDULE A F/U

## 2014-08-13 NOTE — Telephone Encounter (Signed)
PT SCHEDULED FOR 10/9

## 2014-08-16 ENCOUNTER — Ambulatory Visit (INDEPENDENT_AMBULATORY_CARE_PROVIDER_SITE_OTHER): Payer: Commercial Indemnity | Admitting: Cardiology

## 2014-08-16 ENCOUNTER — Encounter: Payer: Self-pay | Admitting: Cardiology

## 2014-08-16 VITALS — BP 117/69 | HR 64 | Ht 74.0 in | Wt 292.0 lb

## 2014-08-16 DIAGNOSIS — I429 Cardiomyopathy, unspecified: Secondary | ICD-10-CM

## 2014-08-16 DIAGNOSIS — E785 Hyperlipidemia, unspecified: Secondary | ICD-10-CM

## 2014-08-16 DIAGNOSIS — I428 Other cardiomyopathies: Secondary | ICD-10-CM

## 2014-08-16 DIAGNOSIS — I10 Essential (primary) hypertension: Secondary | ICD-10-CM

## 2014-08-16 MED ORDER — ATORVASTATIN CALCIUM 40 MG PO TABS: 40.0000 mg | ORAL_TABLET | Freq: Every day | ORAL | Status: DC

## 2014-08-16 NOTE — Progress Notes (Signed)
Clinical Summary Mr. Boik is a 68 y.o.male seen today for follow up of the following medical probems.  1. NICM  - previously significant LV dysfunction with LVEF 25-30% in 2004, last echo 2011 shows normalized LVEF at 60%. Cath 2004 with no significant coronary disease  - denies any SOB, no DOE, no chest pain, no orthopnea, no PND, no LE edema  - compliant with medications: coreg, benazepril, spironlactone, bumex. Denies any lightheadedness of dizziness  - limiting sodium intake, not taking NSAID   2. HTN  - does not check regularly at home - compliant with meds   3. HL  - no recent panel on our system, managed by his PCP.      Past Medical History  Diagnosis Date  . Overweight   . HTN (hypertension)     unspec  . Cardiomyopathy     secondary  . DM2 (diabetes mellitus, type 2)   . Nonischemic cardiomyopathy     a. EF 40-50% by most recent 2-D echo b. EF 25% by cardiac cath in 2004     No Known Allergies   Current Outpatient Prescriptions  Medication Sig Dispense Refill  . amLODipine-benazepril (LOTREL) 10-20 MG per capsule TAKE 1 CAPSULE DAILY (NEEDS OFFICE VISIT)  90 capsule  1  . aspirin (ASPIR-LOW) 81 MG EC tablet Take 81 mg by mouth daily.        . bumetanide (BUMEX) 1 MG tablet TAKE 1 TABLET TWICE A DAY  180 tablet  1  . carvedilol (COREG) 25 MG tablet Take 1 tablet (25 mg total) by mouth 2 (two) times daily.  180 tablet  3  . COD LIVER OIL PO Take 1 capsule by mouth daily.      . metFORMIN (GLUMETZA) 500 MG (MOD) 24 hr tablet Take 500 mg by mouth 2 (two) times daily.        . ONGLYZA 5 MG TABS tablet Take 5 mg by mouth daily.       . simvastatin (ZOCOR) 20 MG tablet Take 20 mg by mouth daily.        Marland Kitchen spironolactone (ALDACTONE) 25 MG tablet TAKE 1 TABLET ONCE DAILY  90 tablet  0  . SYMBICORT 160-4.5 MCG/ACT inhaler Inhale 2 puffs into the lungs 2 (two) times daily as needed.        No current facility-administered medications for this visit.      No past surgical history on file.   No Known Allergies    No family history on file.   Social History Mr. Cantave reports that he has quit smoking. He does not have any smokeless tobacco history on file. Mr. Colglazier reports that he does not drink alcohol.   Review of Systems CONSTITUTIONAL: No weight loss, fever, chills, weakness or fatigue.  HEENT: Eyes: No visual loss, blurred vision, double vision or yellow sclerae.No hearing loss, sneezing, congestion, runny nose or sore throat.  SKIN: No rash or itching.  CARDIOVASCULAR: per HPI  RESPIRATORY: No shortness of breath, cough or sputum.  GASTROINTESTINAL: No anorexia, nausea, vomiting or diarrhea. No abdominal pain or blood.  GENITOURINARY: No burning on urination, no polyuria NEUROLOGICAL: No headache, dizziness, syncope, paralysis, ataxia, numbness or tingling in the extremities. No change in bowel or bladder control.  MUSCULOSKELETAL: No muscle, back pain, joint pain or stiffness.  LYMPHATICS: No enlarged nodes. No history of splenectomy.  PSYCHIATRIC: No history of depression or anxiety.  ENDOCRINOLOGIC: No reports of sweating, cold or heat intolerance. No  polyuria or polydipsia.  Marland Kitchen   Physical Examination There were no vitals filed for this visit. There were no vitals filed for this visit.  Gen: resting comfortably, no acute distress HEENT: no scleral icterus, pupils equal round and reactive, no palptable cervical adenopathy,  CV Resp: Clear to auscultation bilaterally GI: abdomen is soft, non-tender, non-distended, normal bowel sounds, no hepatosplenomegaly MSK: extremities are warm, no edema.  Skin: warm, no rash Neuro:  no focal deficits Psych: appropriate affect   Diagnostic Studies  05/2010 Echo: Normal LV systolic function 123456, mod LAE, mild LVH.  08/31/13 Clinic EKG: sinus rhythm, 1st degree AV block    Assessment and Plan   1. NICM  - previously significant LV systolic dysfunction, that  has since normalized on medical therapy  - denies any significant symptoms, he appears euvolemic on exam today  - continue current medications   2. HTN  - blood pressure right at goal given his history of diabetes  - continue current medications   3. HL  - given his history of DM, based on most recent lipid guidelines should be at least moderate stregnth statin. Stop zocor, start atorvastatin 40mg  daily   F/u 1 year      Arnoldo Lenis, M.D.

## 2014-08-16 NOTE — Patient Instructions (Signed)
   Stop Simvastatin  Start Atorvastatin 40mg  daily. Sent to [facility] Continue all other medications.   Your physician wants you to follow up in:  1 year.  You will receive a reminder letter in the mail one-two months in advance.  If you don't receive a letter, please call our office to schedule the follow up appointment.

## 2015-10-07 ENCOUNTER — Encounter: Payer: Self-pay | Admitting: Cardiology

## 2015-10-07 ENCOUNTER — Ambulatory Visit (INDEPENDENT_AMBULATORY_CARE_PROVIDER_SITE_OTHER): Payer: Medicare Other | Admitting: Cardiology

## 2015-10-07 ENCOUNTER — Encounter: Payer: Self-pay | Admitting: *Deleted

## 2015-10-07 VITALS — BP 147/81 | HR 72 | Ht 74.0 in | Wt 291.4 lb

## 2015-10-07 DIAGNOSIS — E785 Hyperlipidemia, unspecified: Secondary | ICD-10-CM

## 2015-10-07 DIAGNOSIS — I10 Essential (primary) hypertension: Secondary | ICD-10-CM

## 2015-10-07 DIAGNOSIS — Z136 Encounter for screening for cardiovascular disorders: Secondary | ICD-10-CM

## 2015-10-07 DIAGNOSIS — I428 Other cardiomyopathies: Secondary | ICD-10-CM

## 2015-10-07 DIAGNOSIS — I429 Cardiomyopathy, unspecified: Secondary | ICD-10-CM | POA: Diagnosis not present

## 2015-10-07 NOTE — Progress Notes (Signed)
Patient ID: KONA BARTOK, male   DOB: [birth year], 69 y.o.   MRN: [id number]     Clinical Summary Mr. Masin is a 69 y.o.male seen today for follow up of the following medical problems.   1. NICM  - previously significant LV dysfunction with LVEF 25-30% in 2004, last echo 2011 shows normalized LVEF at 60%. Cath 2004 with no significant coronary disease  - limiting sodium intake, not taking NSAID  - denies any SOB or DOE - compliant with meds   2. HTN  - compliant with meds    3. HL  - no recent panel on our system, managed by his PCP.   4. Prostate Cancer - treated radiation at [facility]  Past Medical History  Diagnosis Date  . Overweight(278.02)   . HTN (hypertension)     unspec  . Cardiomyopathy     secondary  . DM2 (diabetes mellitus, type 2)   . Nonischemic cardiomyopathy ([place])     a. EF 40-50% by most recent 2-D echo b. EF 25% by cardiac cath in 2004     No Known Allergies   Current Outpatient Prescriptions  Medication Sig Dispense Refill  . amLODipine-benazepril (LOTREL) 10-20 MG per capsule TAKE 1 CAPSULE DAILY (NEEDS OFFICE VISIT) 90 capsule 1  . aspirin (ASPIR-LOW) 81 MG EC tablet Take 81 mg by mouth daily.      Marland Kitchen atorvastatin (LIPITOR) 40 MG tablet Take 1 tablet (40 mg total) by mouth daily. 90 tablet 3  . bumetanide (BUMEX) 1 MG tablet TAKE 1 TABLET TWICE A DAY 180 tablet 1  . carvedilol (COREG) 25 MG tablet Take 1 tablet (25 mg total) by mouth 2 (two) times daily. 180 tablet 3  . COD LIVER OIL PO Take 1 capsule by mouth daily.    . metFORMIN (GLUMETZA) 500 MG (MOD) 24 hr tablet Take 500 mg by mouth 2 (two) times daily.      . Naproxen Sodium (ALEVE) 220 MG CAPS Take 1 capsule by mouth every 6 (six) hours as needed.    . ONGLYZA 5 MG TABS tablet Take 5 mg by mouth daily.     Marland Kitchen spironolactone (ALDACTONE) 25 MG tablet TAKE 1 TABLET ONCE DAILY 90 tablet 0  . SYMBICORT 160-4.5 MCG/ACT inhaler Inhale 2 puffs into the lungs 2 (two) times daily as needed.       No current facility-administered medications for this visit.     No past surgical history on file.   No Known Allergies    Denies family history of heart disease   Social History Mr. Goad reports that he quit smoking about 30 years ago. His smoking use included Cigarettes. He started smoking about 49 years ago. He has a 10 pack-year smoking history. He has never used smokeless tobacco. Mr. Nieves reports that he does not drink alcohol.   Review of Systems CONSTITUTIONAL: No weight loss, fever, chills, weakness or fatigue.  HEENT: Eyes: No visual loss, blurred vision, double vision or yellow sclerae.No hearing loss, sneezing, congestion, runny nose or sore throat.  SKIN: No rash or itching.  CARDIOVASCULAR: per hpi RESPIRATORY: No shortness of breath, cough or sputum.  GASTROINTESTINAL: No anorexia, nausea, vomiting or diarrhea. No abdominal pain or blood.  GENITOURINARY: No burning on urination, no polyuria NEUROLOGICAL: No headache, dizziness, syncope, paralysis, ataxia, numbness or tingling in the extremities. No change in bowel or bladder control.  MUSCULOSKELETAL: No muscle, back pain, joint pain or stiffness.  LYMPHATICS: No enlarged nodes. No history of  splenectomy.  PSYCHIATRIC: No history of depression or anxiety.  ENDOCRINOLOGIC: No reports of sweating, cold or heat intolerance. No polyuria or polydipsia.  Marland Kitchen   Physical Examination Filed Vitals:   10/07/15 1531  BP: 147/81  Pulse: 72   Filed Vitals:   10/07/15 1531  Height: 6\' 2"  (1.88 m)  Weight: 291 lb 6.4 oz (132.178 kg)    Gen: resting comfortably, no acute distress HEENT: no scleral icterus, pupils equal round and reactive, no palptable cervical adenopathy,  CV: RRR no  m/rg, no jvd Resp: Clear to auscultation bilaterally GI: abdomen is soft, non-tender, non-distended, normal bowel sounds, no hepatosplenomegaly MSK: extremities are warm, no edema.  Skin: warm, no rash Neuro:  no focal  deficits Psych: appropriate affect   Diagnostic Studies 05/2010 Echo: Normal LV systolic function 123456, mod LAE, mild LVH.  08/31/13 Clinic EKG: sinus rhythm, 1st degree AV block      Assessment and Plan  1. NICM  - previously significant LV systolic dysfunction, that has since normalized on medical therapy  - denies any significant symptoms - continue current medications   2. HTN  - bp above goal in clinic, reported home numbers at goal. Contiue current meds  3. HL  - continue statin, request labs from pcp   F/u 1 year       Arnoldo Lenis, M.D.

## 2015-10-07 NOTE — Patient Instructions (Signed)
Your physician recommends that you schedule a follow-up appointment in: [place] [address] [place]  Your physician recommends that you continue on your current medications as directed. Please refer to the Current Medication list given to you today.  WE WILL REQUEST LABS  Thank you for choosing [facility]!!

## 2015-11-21 DIAGNOSIS — E7800 Pure hypercholesterolemia, unspecified: Secondary | ICD-10-CM | POA: Diagnosis not present

## 2015-11-21 DIAGNOSIS — Z87891 Personal history of nicotine dependence: Secondary | ICD-10-CM | POA: Diagnosis not present

## 2015-11-21 DIAGNOSIS — Z6837 Body mass index (BMI) 37.0-37.9, adult: Secondary | ICD-10-CM | POA: Diagnosis not present

## 2015-11-21 DIAGNOSIS — M545 Low back pain: Secondary | ICD-10-CM | POA: Diagnosis not present

## 2015-11-21 DIAGNOSIS — E1165 Type 2 diabetes mellitus with hyperglycemia: Secondary | ICD-10-CM | POA: Diagnosis not present

## 2015-11-27 DIAGNOSIS — C61 Malignant neoplasm of prostate: Secondary | ICD-10-CM | POA: Diagnosis not present

## 2015-11-27 DIAGNOSIS — Z923 Personal history of irradiation: Secondary | ICD-10-CM | POA: Diagnosis not present

## 2015-12-09 DIAGNOSIS — C61 Malignant neoplasm of prostate: Secondary | ICD-10-CM | POA: Diagnosis not present

## 2015-12-16 DIAGNOSIS — C61 Malignant neoplasm of prostate: Secondary | ICD-10-CM | POA: Diagnosis not present

## 2016-01-12 DIAGNOSIS — H35033 Hypertensive retinopathy, bilateral: Secondary | ICD-10-CM | POA: Diagnosis not present

## 2016-02-26 DIAGNOSIS — Z923 Personal history of irradiation: Secondary | ICD-10-CM | POA: Diagnosis not present

## 2016-02-26 DIAGNOSIS — C61 Malignant neoplasm of prostate: Secondary | ICD-10-CM | POA: Diagnosis not present

## 2016-02-27 DIAGNOSIS — E1122 Type 2 diabetes mellitus with diabetic chronic kidney disease: Secondary | ICD-10-CM | POA: Diagnosis not present

## 2016-02-27 DIAGNOSIS — I10 Essential (primary) hypertension: Secondary | ICD-10-CM | POA: Diagnosis not present

## 2016-02-27 DIAGNOSIS — E7800 Pure hypercholesterolemia, unspecified: Secondary | ICD-10-CM | POA: Diagnosis not present

## 2016-02-27 DIAGNOSIS — R600 Localized edema: Secondary | ICD-10-CM | POA: Diagnosis not present

## 2016-02-27 DIAGNOSIS — N183 Chronic kidney disease, stage 3 (moderate): Secondary | ICD-10-CM | POA: Diagnosis not present

## 2016-03-22 DIAGNOSIS — I10 Essential (primary) hypertension: Secondary | ICD-10-CM | POA: Diagnosis not present

## 2016-03-22 DIAGNOSIS — E7800 Pure hypercholesterolemia, unspecified: Secondary | ICD-10-CM | POA: Diagnosis not present

## 2016-03-22 DIAGNOSIS — M159 Polyosteoarthritis, unspecified: Secondary | ICD-10-CM | POA: Diagnosis not present

## 2016-03-31 DIAGNOSIS — I10 Essential (primary) hypertension: Secondary | ICD-10-CM | POA: Diagnosis not present

## 2016-03-31 DIAGNOSIS — M79602 Pain in left arm: Secondary | ICD-10-CM | POA: Diagnosis not present

## 2016-03-31 DIAGNOSIS — N183 Chronic kidney disease, stage 3 (moderate): Secondary | ICD-10-CM | POA: Diagnosis not present

## 2016-04-28 DIAGNOSIS — E7800 Pure hypercholesterolemia, unspecified: Secondary | ICD-10-CM | POA: Diagnosis not present

## 2016-04-28 DIAGNOSIS — I10 Essential (primary) hypertension: Secondary | ICD-10-CM | POA: Diagnosis not present

## 2016-04-28 DIAGNOSIS — M159 Polyosteoarthritis, unspecified: Secondary | ICD-10-CM | POA: Diagnosis not present

## 2016-05-31 DIAGNOSIS — Z299 Encounter for prophylactic measures, unspecified: Secondary | ICD-10-CM | POA: Diagnosis not present

## 2016-05-31 DIAGNOSIS — I10 Essential (primary) hypertension: Secondary | ICD-10-CM | POA: Diagnosis not present

## 2016-05-31 DIAGNOSIS — M109 Gout, unspecified: Secondary | ICD-10-CM | POA: Diagnosis not present

## 2016-05-31 DIAGNOSIS — N183 Chronic kidney disease, stage 3 (moderate): Secondary | ICD-10-CM | POA: Diagnosis not present

## 2016-05-31 DIAGNOSIS — E1122 Type 2 diabetes mellitus with diabetic chronic kidney disease: Secondary | ICD-10-CM | POA: Diagnosis not present

## 2016-06-07 DIAGNOSIS — I10 Essential (primary) hypertension: Secondary | ICD-10-CM | POA: Diagnosis not present

## 2016-06-07 DIAGNOSIS — M159 Polyosteoarthritis, unspecified: Secondary | ICD-10-CM | POA: Diagnosis not present

## 2016-06-07 DIAGNOSIS — E7800 Pure hypercholesterolemia, unspecified: Secondary | ICD-10-CM | POA: Diagnosis not present

## 2016-06-17 DIAGNOSIS — M159 Polyosteoarthritis, unspecified: Secondary | ICD-10-CM | POA: Diagnosis not present

## 2016-06-17 DIAGNOSIS — I10 Essential (primary) hypertension: Secondary | ICD-10-CM | POA: Diagnosis not present

## 2016-06-17 DIAGNOSIS — E7800 Pure hypercholesterolemia, unspecified: Secondary | ICD-10-CM | POA: Diagnosis not present

## 2016-06-22 DIAGNOSIS — C61 Malignant neoplasm of prostate: Secondary | ICD-10-CM | POA: Diagnosis not present

## 2016-06-29 DIAGNOSIS — C61 Malignant neoplasm of prostate: Secondary | ICD-10-CM | POA: Diagnosis not present

## 2016-07-27 DIAGNOSIS — M1990 Unspecified osteoarthritis, unspecified site: Secondary | ICD-10-CM | POA: Diagnosis not present

## 2016-07-27 DIAGNOSIS — M79671 Pain in right foot: Secondary | ICD-10-CM | POA: Diagnosis not present

## 2016-07-27 DIAGNOSIS — M79672 Pain in left foot: Secondary | ICD-10-CM | POA: Diagnosis not present

## 2016-07-27 DIAGNOSIS — M10071 Idiopathic gout, right ankle and foot: Secondary | ICD-10-CM | POA: Diagnosis not present

## 2016-08-04 DIAGNOSIS — M10071 Idiopathic gout, right ankle and foot: Secondary | ICD-10-CM | POA: Diagnosis not present

## 2016-08-04 DIAGNOSIS — M79672 Pain in left foot: Secondary | ICD-10-CM | POA: Diagnosis not present

## 2016-08-04 DIAGNOSIS — M1990 Unspecified osteoarthritis, unspecified site: Secondary | ICD-10-CM | POA: Diagnosis not present

## 2016-08-04 DIAGNOSIS — M79671 Pain in right foot: Secondary | ICD-10-CM | POA: Diagnosis not present

## 2016-08-04 DIAGNOSIS — Z23 Encounter for immunization: Secondary | ICD-10-CM | POA: Diagnosis not present

## 2016-08-30 DIAGNOSIS — E7800 Pure hypercholesterolemia, unspecified: Secondary | ICD-10-CM | POA: Diagnosis not present

## 2016-08-30 DIAGNOSIS — I10 Essential (primary) hypertension: Secondary | ICD-10-CM | POA: Diagnosis not present

## 2016-08-30 DIAGNOSIS — M159 Polyosteoarthritis, unspecified: Secondary | ICD-10-CM | POA: Diagnosis not present

## 2016-09-10 DIAGNOSIS — Z299 Encounter for prophylactic measures, unspecified: Secondary | ICD-10-CM | POA: Diagnosis not present

## 2016-09-10 DIAGNOSIS — I10 Essential (primary) hypertension: Secondary | ICD-10-CM | POA: Diagnosis not present

## 2016-09-10 DIAGNOSIS — E1122 Type 2 diabetes mellitus with diabetic chronic kidney disease: Secondary | ICD-10-CM | POA: Diagnosis not present

## 2016-09-10 DIAGNOSIS — Z6836 Body mass index (BMI) 36.0-36.9, adult: Secondary | ICD-10-CM | POA: Diagnosis not present

## 2016-09-10 DIAGNOSIS — N183 Chronic kidney disease, stage 3 (moderate): Secondary | ICD-10-CM | POA: Diagnosis not present

## 2016-09-10 DIAGNOSIS — M109 Gout, unspecified: Secondary | ICD-10-CM | POA: Diagnosis not present

## 2016-10-06 DIAGNOSIS — E7800 Pure hypercholesterolemia, unspecified: Secondary | ICD-10-CM | POA: Diagnosis not present

## 2016-10-06 DIAGNOSIS — I10 Essential (primary) hypertension: Secondary | ICD-10-CM | POA: Diagnosis not present

## 2016-10-06 DIAGNOSIS — M159 Polyosteoarthritis, unspecified: Secondary | ICD-10-CM | POA: Diagnosis not present

## 2016-10-07 ENCOUNTER — Encounter: Payer: Self-pay | Admitting: *Deleted

## 2016-10-07 ENCOUNTER — Encounter: Payer: Self-pay | Admitting: Cardiology

## 2016-10-07 ENCOUNTER — Ambulatory Visit (INDEPENDENT_AMBULATORY_CARE_PROVIDER_SITE_OTHER): Payer: Medicare Other | Admitting: Cardiology

## 2016-10-07 VITALS — BP 133/76 | HR 70 | Ht 74.0 in | Wt 282.8 lb

## 2016-10-07 DIAGNOSIS — I428 Other cardiomyopathies: Secondary | ICD-10-CM | POA: Diagnosis not present

## 2016-10-07 DIAGNOSIS — E782 Mixed hyperlipidemia: Secondary | ICD-10-CM | POA: Diagnosis not present

## 2016-10-07 DIAGNOSIS — I10 Essential (primary) hypertension: Secondary | ICD-10-CM | POA: Diagnosis not present

## 2016-10-07 DIAGNOSIS — Z136 Encounter for screening for cardiovascular disorders: Secondary | ICD-10-CM | POA: Diagnosis not present

## 2016-10-07 NOTE — Progress Notes (Signed)
Clinical Summary Todd Robertson is a 70 y.o.male seen today for follow up of the following medical problems.   1. NICM  - previously significant LV dysfunction with LVEF 25-30% in 2004, last echo 2011 shows normalized LVEF at 60%. Cath 2004 with no significant coronary disease    - no SOB or DOE. No LE edema - compliant with meds.   2. HTN  - compliant with meds   - checks at home twice a week, typically 130/70s  3. HL  - compliant with statin  4. Prostate Cancer - treated radiation at [facility]  - followed by urologist.   5. AAA screen - male over 23 with smoking history   SH: recently lost his brother.  Past Medical History:  Diagnosis Date  . Cardiomyopathy    secondary  . DM2 (diabetes mellitus, type 2) ([place])   . HTN (hypertension)    unspec  . Nonischemic cardiomyopathy ([place])    a. EF 40-50% by most recent 2-D echo b. EF 25% by cardiac cath in 2004  . Overweight(278.02)      No Known Allergies   Current Outpatient Prescriptions  Medication Sig Dispense Refill  . amLODipine-benazepril (LOTREL) 10-20 MG per capsule TAKE 1 CAPSULE DAILY (NEEDS OFFICE VISIT) 90 capsule 1  . aspirin (ASPIR-LOW) 81 MG EC tablet Take 81 mg by mouth daily.      Marland Kitchen atorvastatin (LIPITOR) 40 MG tablet Take 1 tablet (40 mg total) by mouth daily. 90 tablet 3  . bumetanide (BUMEX) 1 MG tablet TAKE 1 TABLET TWICE A DAY 180 tablet 1  . carvedilol (COREG) 25 MG tablet Take 1 tablet (25 mg total) by mouth 2 (two) times daily. 180 tablet 3  . COD LIVER OIL PO Take 1 capsule by mouth daily.    . metFORMIN (GLUMETZA) 500 MG (MOD) 24 hr tablet Take 500 mg by mouth 2 (two) times daily.      . Naproxen Sodium (ALEVE) 220 MG CAPS Take 1 capsule by mouth every 6 (six) hours as needed.    . ONGLYZA 5 MG TABS tablet Take 5 mg by mouth daily.     Marland Kitchen spironolactone (ALDACTONE) 25 MG tablet TAKE 1 TABLET ONCE DAILY 90 tablet 0  . SYMBICORT 160-4.5 MCG/ACT inhaler Inhale 2 puffs into the lungs  2 (two) times daily as needed.      No current facility-administered medications for this visit.      No past surgical history on file.   No Known Allergies    No family history on file.   Social History Mr. Ditmer reports that he quit smoking about 31 years ago. His smoking use included Cigarettes. He started smoking about 50 years ago. He has a 10.00 pack-year smoking history. He has never used smokeless tobacco. Mr. Wiegman reports that he does not drink alcohol.   Review of Systems CONSTITUTIONAL: No weight loss, fever, chills, weakness or fatigue.  HEENT: Eyes: No visual loss, blurred vision, double vision or yellow sclerae.No hearing loss, sneezing, congestion, runny nose or sore throat.  SKIN: No rash or itching.  CARDIOVASCULAR: per hpi RESPIRATORY: No shortness of breath, cough or sputum.  GASTROINTESTINAL: No anorexia, nausea, vomiting or diarrhea. No abdominal pain or blood.  GENITOURINARY: No burning on urination, no polyuria NEUROLOGICAL: No headache, dizziness, syncope, paralysis, ataxia, numbness or tingling in the extremities. No change in bowel or bladder control.  MUSCULOSKELETAL: No muscle, back pain, joint pain or stiffness.  LYMPHATICS: No enlarged nodes. No  history of splenectomy.  PSYCHIATRIC: No history of depression or anxiety.  ENDOCRINOLOGIC: No reports of sweating, cold or heat intolerance. No polyuria or polydipsia.  Marland Kitchen   Physical Examination Vitals:   10/07/16 1050  BP: 133/76  Pulse: 70   Vitals:   10/07/16 1050  Weight: 282 lb 12.8 oz (128.3 kg)  Height: 6\' 2"  (1.88 m)    Gen: resting comfortably, no acute distress HEENT: no scleral icterus, pupils equal round and reactive, no palptable cervical adenopathy,  CV: RRR, no m/r/g, no jvd Resp: Clear to auscultation bilaterally GI: abdomen is soft, non-tender, non-distended, normal bowel sounds, no hepatosplenomegaly MSK: extremities are warm, no edema.  Skin: warm, no rash Neuro:   no focal deficits Psych: appropriate affect   Diagnostic Studies 05/2010 Echo: Normal LV systolic function 54%, mod LAE, mild LVH.   08/31/13 Clinic EKG: sinus rhythm, 1st degree AV block    Assessment and Plan  1. NICM  - previously significant LV systolic dysfunction, that has since normalized on medical therapy  - no recent symptoms - he will continue current medications   2. HTN  - continue current meds, at goal  3. HL  - continue statin, obtain labs from pcp  4. AAA screen - male over 83 with tobacco history, order AAA screening US   F/u 1 year      Arnoldo Lenis, M.D.

## 2016-10-07 NOTE — Patient Instructions (Signed)
Your physician wants you to follow-up in: [place] [address] [place] You will receive a reminder letter in the mail two months in advance. If you don't receive a letter, please call our office to schedule the follow-up appointment.  Your physician recommends that you continue on your current medications as directed. Please refer to the Current Medication list given to you today.  Your physician has requested that you have an abdominal aorta duplex. During this test, an ultrasound is used to evaluate the aorta. Allow 30 minutes for this exam. Do not eat after midnight the day before and avoid carbonated beverages  Thank you for choosing [facility]!!

## 2016-10-15 DIAGNOSIS — Z6835 Body mass index (BMI) 35.0-35.9, adult: Secondary | ICD-10-CM | POA: Diagnosis not present

## 2016-10-15 DIAGNOSIS — Z7189 Other specified counseling: Secondary | ICD-10-CM | POA: Diagnosis not present

## 2016-10-15 DIAGNOSIS — M109 Gout, unspecified: Secondary | ICD-10-CM | POA: Diagnosis not present

## 2016-10-15 DIAGNOSIS — Z79899 Other long term (current) drug therapy: Secondary | ICD-10-CM | POA: Diagnosis not present

## 2016-10-15 DIAGNOSIS — Z0000 Encounter for general adult medical examination without abnormal findings: Secondary | ICD-10-CM | POA: Diagnosis not present

## 2016-10-15 DIAGNOSIS — Z299 Encounter for prophylactic measures, unspecified: Secondary | ICD-10-CM | POA: Diagnosis not present

## 2016-10-15 DIAGNOSIS — R5383 Other fatigue: Secondary | ICD-10-CM | POA: Diagnosis not present

## 2016-10-15 DIAGNOSIS — E7800 Pure hypercholesterolemia, unspecified: Secondary | ICD-10-CM | POA: Diagnosis not present

## 2016-10-15 DIAGNOSIS — Z1211 Encounter for screening for malignant neoplasm of colon: Secondary | ICD-10-CM | POA: Diagnosis not present

## 2016-10-15 DIAGNOSIS — Z1389 Encounter for screening for other disorder: Secondary | ICD-10-CM | POA: Diagnosis not present

## 2016-10-27 DIAGNOSIS — E1151 Type 2 diabetes mellitus with diabetic peripheral angiopathy without gangrene: Secondary | ICD-10-CM | POA: Diagnosis not present

## 2016-10-27 DIAGNOSIS — E1140 Type 2 diabetes mellitus with diabetic neuropathy, unspecified: Secondary | ICD-10-CM | POA: Diagnosis not present

## 2016-10-27 DIAGNOSIS — L110 Acquired keratosis follicularis: Secondary | ICD-10-CM | POA: Diagnosis not present

## 2016-10-27 DIAGNOSIS — B351 Tinea unguium: Secondary | ICD-10-CM | POA: Diagnosis not present

## 2016-11-09 DIAGNOSIS — E7800 Pure hypercholesterolemia, unspecified: Secondary | ICD-10-CM | POA: Diagnosis not present

## 2016-11-09 DIAGNOSIS — M159 Polyosteoarthritis, unspecified: Secondary | ICD-10-CM | POA: Diagnosis not present

## 2016-11-09 DIAGNOSIS — I10 Essential (primary) hypertension: Secondary | ICD-10-CM | POA: Diagnosis not present

## 2016-12-29 ENCOUNTER — Ambulatory Visit: Payer: Medicare Other

## 2016-12-29 DIAGNOSIS — Z136 Encounter for screening for cardiovascular disorders: Secondary | ICD-10-CM

## 2017-01-03 DIAGNOSIS — M159 Polyosteoarthritis, unspecified: Secondary | ICD-10-CM | POA: Diagnosis not present

## 2017-01-03 DIAGNOSIS — E7800 Pure hypercholesterolemia, unspecified: Secondary | ICD-10-CM | POA: Diagnosis not present

## 2017-01-03 DIAGNOSIS — I10 Essential (primary) hypertension: Secondary | ICD-10-CM | POA: Diagnosis not present

## 2017-01-06 ENCOUNTER — Telehealth: Payer: Self-pay | Admitting: *Deleted

## 2017-01-06 NOTE — Telephone Encounter (Signed)
pt aware - routed to pcp

## 2017-01-06 NOTE — Telephone Encounter (Signed)
-----   Message from Arnoldo Lenis, MD sent at 01/03/2017  4:03 PM EST ----- Normal abdominal aorta, no evidence of aneurysm  Zandra Abts MD

## 2017-01-13 DIAGNOSIS — E1140 Type 2 diabetes mellitus with diabetic neuropathy, unspecified: Secondary | ICD-10-CM | POA: Diagnosis not present

## 2017-01-13 DIAGNOSIS — B351 Tinea unguium: Secondary | ICD-10-CM | POA: Diagnosis not present

## 2017-01-13 DIAGNOSIS — E1151 Type 2 diabetes mellitus with diabetic peripheral angiopathy without gangrene: Secondary | ICD-10-CM | POA: Diagnosis not present

## 2017-01-13 DIAGNOSIS — L110 Acquired keratosis follicularis: Secondary | ICD-10-CM | POA: Diagnosis not present

## 2017-01-14 DIAGNOSIS — E1122 Type 2 diabetes mellitus with diabetic chronic kidney disease: Secondary | ICD-10-CM | POA: Diagnosis not present

## 2017-01-14 DIAGNOSIS — J449 Chronic obstructive pulmonary disease, unspecified: Secondary | ICD-10-CM | POA: Diagnosis not present

## 2017-01-14 DIAGNOSIS — C61 Malignant neoplasm of prostate: Secondary | ICD-10-CM | POA: Diagnosis not present

## 2017-01-14 DIAGNOSIS — Z87891 Personal history of nicotine dependence: Secondary | ICD-10-CM | POA: Diagnosis not present

## 2017-01-14 DIAGNOSIS — I10 Essential (primary) hypertension: Secondary | ICD-10-CM | POA: Diagnosis not present

## 2017-01-14 DIAGNOSIS — Z6834 Body mass index (BMI) 34.0-34.9, adult: Secondary | ICD-10-CM | POA: Diagnosis not present

## 2017-01-14 DIAGNOSIS — I429 Cardiomyopathy, unspecified: Secondary | ICD-10-CM | POA: Diagnosis not present

## 2017-01-14 DIAGNOSIS — Z713 Dietary counseling and surveillance: Secondary | ICD-10-CM | POA: Diagnosis not present

## 2017-01-14 DIAGNOSIS — Z299 Encounter for prophylactic measures, unspecified: Secondary | ICD-10-CM | POA: Diagnosis not present

## 2017-01-14 DIAGNOSIS — N183 Chronic kidney disease, stage 3 (moderate): Secondary | ICD-10-CM | POA: Diagnosis not present

## 2017-03-03 DIAGNOSIS — I10 Essential (primary) hypertension: Secondary | ICD-10-CM | POA: Diagnosis not present

## 2017-03-03 DIAGNOSIS — M159 Polyosteoarthritis, unspecified: Secondary | ICD-10-CM | POA: Diagnosis not present

## 2017-03-03 DIAGNOSIS — E7800 Pure hypercholesterolemia, unspecified: Secondary | ICD-10-CM | POA: Diagnosis not present

## 2017-03-16 DIAGNOSIS — M170 Bilateral primary osteoarthritis of knee: Secondary | ICD-10-CM | POA: Diagnosis not present

## 2017-03-24 DIAGNOSIS — E1151 Type 2 diabetes mellitus with diabetic peripheral angiopathy without gangrene: Secondary | ICD-10-CM | POA: Diagnosis not present

## 2017-03-24 DIAGNOSIS — E1140 Type 2 diabetes mellitus with diabetic neuropathy, unspecified: Secondary | ICD-10-CM | POA: Diagnosis not present

## 2017-03-24 DIAGNOSIS — L110 Acquired keratosis follicularis: Secondary | ICD-10-CM | POA: Diagnosis not present

## 2017-03-24 DIAGNOSIS — B351 Tinea unguium: Secondary | ICD-10-CM | POA: Diagnosis not present

## 2017-03-30 DIAGNOSIS — E7800 Pure hypercholesterolemia, unspecified: Secondary | ICD-10-CM | POA: Diagnosis not present

## 2017-03-30 DIAGNOSIS — M159 Polyosteoarthritis, unspecified: Secondary | ICD-10-CM | POA: Diagnosis not present

## 2017-03-30 DIAGNOSIS — I10 Essential (primary) hypertension: Secondary | ICD-10-CM | POA: Diagnosis not present

## 2017-04-11 DIAGNOSIS — M25562 Pain in left knee: Secondary | ICD-10-CM | POA: Diagnosis not present

## 2017-04-11 DIAGNOSIS — M1711 Unilateral primary osteoarthritis, right knee: Secondary | ICD-10-CM | POA: Diagnosis not present

## 2017-04-11 DIAGNOSIS — M1712 Unilateral primary osteoarthritis, left knee: Secondary | ICD-10-CM | POA: Diagnosis not present

## 2017-04-11 DIAGNOSIS — M25561 Pain in right knee: Secondary | ICD-10-CM | POA: Diagnosis not present

## 2017-04-19 DIAGNOSIS — M159 Polyosteoarthritis, unspecified: Secondary | ICD-10-CM | POA: Diagnosis not present

## 2017-04-19 DIAGNOSIS — I10 Essential (primary) hypertension: Secondary | ICD-10-CM | POA: Diagnosis not present

## 2017-04-19 DIAGNOSIS — E7800 Pure hypercholesterolemia, unspecified: Secondary | ICD-10-CM | POA: Diagnosis not present

## 2017-04-26 DIAGNOSIS — J449 Chronic obstructive pulmonary disease, unspecified: Secondary | ICD-10-CM | POA: Diagnosis not present

## 2017-04-26 DIAGNOSIS — Z299 Encounter for prophylactic measures, unspecified: Secondary | ICD-10-CM | POA: Diagnosis not present

## 2017-04-26 DIAGNOSIS — C61 Malignant neoplasm of prostate: Secondary | ICD-10-CM | POA: Diagnosis not present

## 2017-04-26 DIAGNOSIS — N183 Chronic kidney disease, stage 3 (moderate): Secondary | ICD-10-CM | POA: Diagnosis not present

## 2017-04-26 DIAGNOSIS — I10 Essential (primary) hypertension: Secondary | ICD-10-CM | POA: Diagnosis not present

## 2017-04-26 DIAGNOSIS — I429 Cardiomyopathy, unspecified: Secondary | ICD-10-CM | POA: Diagnosis not present

## 2017-04-26 DIAGNOSIS — E7800 Pure hypercholesterolemia, unspecified: Secondary | ICD-10-CM | POA: Diagnosis not present

## 2017-04-26 DIAGNOSIS — E1122 Type 2 diabetes mellitus with diabetic chronic kidney disease: Secondary | ICD-10-CM | POA: Diagnosis not present

## 2017-04-26 DIAGNOSIS — Z6836 Body mass index (BMI) 36.0-36.9, adult: Secondary | ICD-10-CM | POA: Diagnosis not present

## 2017-06-09 DIAGNOSIS — E1151 Type 2 diabetes mellitus with diabetic peripheral angiopathy without gangrene: Secondary | ICD-10-CM | POA: Diagnosis not present

## 2017-06-09 DIAGNOSIS — L110 Acquired keratosis follicularis: Secondary | ICD-10-CM | POA: Diagnosis not present

## 2017-06-09 DIAGNOSIS — B351 Tinea unguium: Secondary | ICD-10-CM | POA: Diagnosis not present

## 2017-06-09 DIAGNOSIS — E1140 Type 2 diabetes mellitus with diabetic neuropathy, unspecified: Secondary | ICD-10-CM | POA: Diagnosis not present

## 2017-06-10 DIAGNOSIS — M1711 Unilateral primary osteoarthritis, right knee: Secondary | ICD-10-CM | POA: Diagnosis not present

## 2017-06-10 DIAGNOSIS — M1712 Unilateral primary osteoarthritis, left knee: Secondary | ICD-10-CM | POA: Diagnosis not present

## 2017-06-13 DIAGNOSIS — Z713 Dietary counseling and surveillance: Secondary | ICD-10-CM | POA: Diagnosis not present

## 2017-06-13 DIAGNOSIS — J449 Chronic obstructive pulmonary disease, unspecified: Secondary | ICD-10-CM | POA: Diagnosis not present

## 2017-06-13 DIAGNOSIS — E1122 Type 2 diabetes mellitus with diabetic chronic kidney disease: Secondary | ICD-10-CM | POA: Diagnosis not present

## 2017-06-13 DIAGNOSIS — N183 Chronic kidney disease, stage 3 (moderate): Secondary | ICD-10-CM | POA: Diagnosis not present

## 2017-06-13 DIAGNOSIS — Z6835 Body mass index (BMI) 35.0-35.9, adult: Secondary | ICD-10-CM | POA: Diagnosis not present

## 2017-06-13 DIAGNOSIS — M1990 Unspecified osteoarthritis, unspecified site: Secondary | ICD-10-CM | POA: Diagnosis not present

## 2017-06-13 DIAGNOSIS — C61 Malignant neoplasm of prostate: Secondary | ICD-10-CM | POA: Diagnosis not present

## 2017-06-13 DIAGNOSIS — Z299 Encounter for prophylactic measures, unspecified: Secondary | ICD-10-CM | POA: Diagnosis not present

## 2017-06-13 DIAGNOSIS — I429 Cardiomyopathy, unspecified: Secondary | ICD-10-CM | POA: Diagnosis not present

## 2017-06-30 ENCOUNTER — Ambulatory Visit (INDEPENDENT_AMBULATORY_CARE_PROVIDER_SITE_OTHER): Payer: Medicare Other | Admitting: Adult Health

## 2017-06-30 ENCOUNTER — Encounter: Payer: Self-pay | Admitting: Adult Health

## 2017-06-30 VITALS — BP 152/88 | HR 65 | Ht 72.0 in | Wt 278.0 lb

## 2017-06-30 DIAGNOSIS — I428 Other cardiomyopathies: Secondary | ICD-10-CM

## 2017-06-30 DIAGNOSIS — I10 Essential (primary) hypertension: Secondary | ICD-10-CM | POA: Diagnosis not present

## 2017-06-30 NOTE — Patient Instructions (Signed)
Medication Instructions:  Your physician recommends that you continue on your current medications as directed. Please refer to the Current Medication list given to you today.   Labwork: NONE  Testing/Procedures: NONE  Follow-Up: Your physician recommends that you schedule a follow-up appointment in: 3 MONTHS    Any Other Special Instructions Will Be Listed Below (If Applicable).     If you need a refill on your cardiac medications before your next appointment, please call your pharmacy.   

## 2017-06-30 NOTE — Progress Notes (Signed)
Cardiology Office Note   Date:  06/30/2017   ID:  Todd Robertson, DOB 1946/01/28, MRN [id number]  PCP:  Monico Blitz, MD  Cardiologist:   [facility]  Chief Complaint  Patient presents with  . Cardiomyopathy      History of Present Illness: Todd Robertson is a 71 y.o. male who presents for ongoing assessment and management of nonischemic cardiomyopathy, most recent echocardiogram dated 2011 revealed normal LV systolic function at 09%.  Cardiac catheterization in 2004 revealed no significant coronary artery disease. Other history includes hypertension, hyperlipidemia, prostate cancer.  Patient was last seen in the office on 10/07/2016 and was stable from a cardiac standpoint. No medication changes were made. He did have a AAA screening completed with an ultrasound. This revealed normal abdominal aorta with no evidence of aneurysm.   He has been without complaint of chest pain dyspnea he has not had any hospitalizations or new diagnoses. The patient is to have total knee replacement on the left and is here for preoperative evaluation. He has been medically compliant. His energy level is about the same but he does not exercise very much due to pain in his knees.   Past Medical History:  Diagnosis Date  . Cardiomyopathy    secondary  . DM2 (diabetes mellitus, type 2) ([place])   . HTN (hypertension)    unspec  . Nonischemic cardiomyopathy ([place])    a. EF 40-50% by most recent 2-D echo b. EF 25% by cardiac cath in 2004  . Overweight(278.02)     No past surgical history on file.   Current Outpatient Prescriptions  Medication Sig Dispense Refill  . amLODipine-benazepril (LOTREL) 10-20 MG per capsule TAKE 1 CAPSULE DAILY (NEEDS OFFICE VISIT) 90 capsule 1  . aspirin (ASPIR-LOW) 81 MG EC tablet Take 81 mg by mouth daily.      Marland Kitchen atorvastatin (LIPITOR) 40 MG tablet Take 1 tablet (40 mg total) by mouth daily. 90 tablet 3  . bumetanide (BUMEX) 1 MG tablet TAKE 1 TABLET TWICE A DAY 180 tablet 1    . carvedilol (COREG) 25 MG tablet Take 1 tablet (25 mg total) by mouth 2 (two) times daily. 180 tablet 3  . COD LIVER OIL PO Take 1 capsule by mouth daily.    . metFORMIN (GLUMETZA) 500 MG (MOD) 24 hr tablet Take 500 mg by mouth 2 (two) times daily.      . Naproxen Sodium (ALEVE) 220 MG CAPS Take 1 capsule by mouth every 6 (six) hours as needed.     No current facility-administered medications for this visit.     Allergies:   Patient has no known allergies.    Social History:  The patient  reports that he quit smoking about 32 years ago. His smoking use included Cigarettes. He started smoking about 51 years ago. He has a 10.00 pack-year smoking history. He has never used smokeless tobacco. He reports that he does not drink alcohol.   Family History:  The patient's family history is not on file.    ROS: All other systems are reviewed and negative. Unless otherwise mentioned in H&P    PHYSICAL EXAM: VS:  BP (!) 152/88   Pulse 65   Ht 6' (1.829 m)   Wt 288 lb (130.6 kg)   SpO2 95%   BMI 39.06 kg/m  , BMI Body mass index is 39.06 kg/m. 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 Heart rate 71 bpm.    Recent Labs: No results found for requested labs within last 8760 hours.    Lipid Panel No results found for: CHOL, TRIG, HDL, CHOLHDL, VLDL, LDLCALC, LDLDIRECT    Wt Readings from Last 3 Encounters:  06/30/17 288 lb (130.6 kg)  10/07/16 282 lb 12.8 oz (128.3 kg)  10/07/15 291 lb 6.4 oz (132.2  kg)     ASSESSMENT AND PLAN:  1.  Preoperative cardiac evaluation: I have reviewed the EKG and also spoken with his primary cardiologist Dr. branch as the EKG is new compared to prior EKG without right bundle-branch block. The patient has been asymptomatic. Dr. Harl Bowie on review of his EKGs and discussion of the patient's case in symptoms agrees that the patient can proceed with total left knee replacement. The patient will need to continue his current antihypertensive regimen which includes amlodipine and benazepril, and carvedilol. He is of acceptable cardiac risk to proceed with total knee replacement. Paperwork is faxed to [facility] and a copy is provided to the patient.  2. Nonischemic cardiomyopathy: Most recent echocardiogram reveals normal LV systolic function. The patient also had screening for AAA which was negative. No plans for further testing prior to surgery.  3. Hypertension: Blood pressure is moderately elevated today. The patient is quite nervous about proceeding with surgery and is also anxious to have this completed. He offers no complaints. We'll need to keep blood pressure well controlled. May have to titrate medications if necessary.   Current medicines are reviewed at length with the patient today.    Labs/ tests ordered today include:  Phill Myron. [place] Pugh, ANP, AACC   06/30/2017 2:46 PM    [facility] [address] [place], [place] [address] Phone: [phone]; Fax: [phone]

## 2017-07-07 ENCOUNTER — Ambulatory Visit: Payer: Self-pay | Admitting: Orthopedic Surgery

## 2017-07-13 ENCOUNTER — Ambulatory Visit: Payer: Self-pay | Admitting: Orthopedic Surgery

## 2017-07-13 NOTE — H&P (Signed)
TOTAL KNEE ADMISSION H&P  Patient is being admitted for left total knee arthroplasty.  Subjective:  Chief Complaint:left knee pain.  HPI: Todd Robertson, 71 y.o. male, has a history of pain and functional disability in the left knee due to arthritis and has failed non-surgical conservative treatments for greater than 12 weeks to includeNSAID's and/or analgesics, corticosteriod injections, flexibility and strengthening excercises, supervised PT with diminished ADL's post treatment, use of assistive devices, weight reduction as appropriate and activity modification.  Onset of symptoms was gradual, starting 3 years ago with gradually worsening course since that time. The patient noted no past surgery on the left knee(s).  Patient currently rates pain in the left knee(s) at 10 out of 10 with activity. Patient has night pain, worsening of pain with activity and weight bearing, pain that interferes with activities of daily living, pain with passive range of motion, crepitus and joint swelling.  Patient has evidence of subchondral cysts, subchondral sclerosis, periarticular osteophytes and joint space narrowing by imaging studies. There is no active infection.  Patient Active Problem List   Diagnosis Date Noted  . DM 12/26/2009  . OVERWEIGHT/OBESITY 08/08/2009  . HTN (hypertension) 08/08/2009  . CARDIOMYOPATHY, SECONDARY 08/08/2009   Past Medical History:  Diagnosis Date  . Cardiomyopathy    secondary  . DM2 (diabetes mellitus, type 2) ([place])   . HTN (hypertension)    unspec  . Nonischemic cardiomyopathy ([place])    a. EF 40-50% by most recent 2-D echo b. EF 25% by cardiac cath in 2004  . Overweight(278.02)     No past surgical history on file.   (Not in a hospital admission) No Known Allergies  Social History  Substance Use Topics  . Smoking status: Former Smoker    Packs/day: 0.50    Years: 20.00    Types: Cigarettes    Start date: 02/12/1966    Quit date: 11/08/1984  . Smokeless  tobacco: Never Used  . Alcohol use No    No family history on file.   Review of Systems  Constitutional: Negative.   HENT: Negative.   Eyes: Negative.   Respiratory: Positive for cough and wheezing. Negative for hemoptysis, sputum production and shortness of breath.   Cardiovascular: Negative.   Gastrointestinal: Negative.   Genitourinary: Negative.   Musculoskeletal: Positive for joint pain.  Skin: Negative.   Neurological: Negative.   Endo/Heme/Allergies: Negative.   Psychiatric/Behavioral: Negative.     Objective:  Physical Exam  Vitals reviewed. Constitutional: He is oriented to person, place, and time. He appears well-developed and well-nourished.  HENT:  Head: Normocephalic and atraumatic.  Eyes: Pupils are equal, round, and reactive to light. Conjunctivae and EOM are normal.  Neck: Normal range of motion. Neck supple.  Cardiovascular: Normal rate, regular rhythm and intact distal pulses.   Respiratory: Effort normal. No respiratory distress.  GI: Soft. He exhibits no distension.  Genitourinary:  Genitourinary Comments: deferred  Musculoskeletal:       Left knee: He exhibits decreased range of motion, swelling, effusion and abnormal alignment. Tenderness found. Medial joint line and lateral joint line tenderness noted.  Neurological: He is alert and oriented to person, place, and time. He has normal reflexes.  Skin: Skin is warm.  Psychiatric: He has a normal mood and affect. His behavior is normal. Judgment and thought content normal.    Vital signs in last 24 hours: @VSRANGES @  Labs:   Estimated body mass index is 37.7 kg/m as calculated from the following:   Height as  of 06/30/17: 6' (1.829 m).   Weight as of 06/30/17: 126.1 kg (278 lb).   Imaging Review Plain radiographs demonstrate severe degenerative joint disease of the left knee(s). The overall alignment issignificant varus. The bone quality appears to be adequate for age and reported activity  level.  Assessment/Plan:  End stage arthritis, left knee   The patient history, physical examination, clinical judgment of the provider and imaging studies are consistent with end stage degenerative joint disease of the left knee(s) and total knee arthroplasty is deemed medically necessary. The treatment options including medical management, injection therapy arthroscopy and arthroplasty were discussed at length. The risks and benefits of total knee arthroplasty were presented and reviewed. The risks due to aseptic loosening, infection, stiffness, patella tracking problems, thromboembolic complications and other imponderables were discussed. The patient acknowledged the explanation, agreed to proceed with the plan and consent was signed. Patient is being admitted for inpatient treatment for surgery, pain control, PT, OT, prophylactic antibiotics, VTE prophylaxis, progressive ambulation and ADL's and discharge planning. The patient is planning to be discharged home with outpatient PT @ [facility]

## 2017-07-27 DIAGNOSIS — I10 Essential (primary) hypertension: Secondary | ICD-10-CM | POA: Diagnosis not present

## 2017-07-27 DIAGNOSIS — M159 Polyosteoarthritis, unspecified: Secondary | ICD-10-CM | POA: Diagnosis not present

## 2017-07-27 DIAGNOSIS — E7800 Pure hypercholesterolemia, unspecified: Secondary | ICD-10-CM | POA: Diagnosis not present

## 2017-07-29 ENCOUNTER — Encounter (HOSPITAL_COMMUNITY): Payer: Self-pay

## 2017-07-29 ENCOUNTER — Encounter (HOSPITAL_COMMUNITY)
Admission: RE | Admit: 2017-07-29 | Discharge: 2017-07-29 | Disposition: A | Discharge status: Home / Self Care | Payer: Medicare Other | Source: Ambulatory Visit | Attending: Orthopedic Surgery | Admitting: Orthopedic Surgery

## 2017-07-29 DIAGNOSIS — Z7982 Long term (current) use of aspirin: Secondary | ICD-10-CM | POA: Insufficient documentation

## 2017-07-29 DIAGNOSIS — Z7984 Long term (current) use of oral hypoglycemic drugs: Secondary | ICD-10-CM | POA: Insufficient documentation

## 2017-07-29 DIAGNOSIS — Z01812 Encounter for preprocedural laboratory examination: Secondary | ICD-10-CM | POA: Insufficient documentation

## 2017-07-29 DIAGNOSIS — I429 Cardiomyopathy, unspecified: Secondary | ICD-10-CM | POA: Diagnosis not present

## 2017-07-29 DIAGNOSIS — N189 Chronic kidney disease, unspecified: Secondary | ICD-10-CM | POA: Diagnosis not present

## 2017-07-29 DIAGNOSIS — M1990 Unspecified osteoarthritis, unspecified site: Secondary | ICD-10-CM | POA: Insufficient documentation

## 2017-07-29 DIAGNOSIS — E1122 Type 2 diabetes mellitus with diabetic chronic kidney disease: Secondary | ICD-10-CM | POA: Diagnosis not present

## 2017-07-29 DIAGNOSIS — Z87891 Personal history of nicotine dependence: Secondary | ICD-10-CM | POA: Diagnosis not present

## 2017-07-29 DIAGNOSIS — I129 Hypertensive chronic kidney disease with stage 1 through stage 4 chronic kidney disease, or unspecified chronic kidney disease: Secondary | ICD-10-CM | POA: Insufficient documentation

## 2017-07-29 DIAGNOSIS — Z8546 Personal history of malignant neoplasm of prostate: Secondary | ICD-10-CM | POA: Diagnosis not present

## 2017-07-29 HISTORY — DX: Unspecified osteoarthritis, unspecified site: M19.90

## 2017-07-29 HISTORY — DX: Malignant (primary) neoplasm, unspecified: C80.1

## 2017-07-29 HISTORY — DX: Chronic kidney disease, unspecified: N18.9

## 2017-07-29 LAB — CBC
HCT: 39.4 % (ref 39.0–52.0)
Hemoglobin: 12.6 g/dL — ABNORMAL LOW (ref 13.0–17.0)
MCH: 29.2 pg (ref 26.0–34.0)
MCHC: 32 g/dL (ref 30.0–36.0)
MCV: 91.4 fL (ref 78.0–100.0)
PLATELETS: 211 10*3/uL (ref 150–400)
RBC: 4.31 MIL/uL (ref 4.22–5.81)
RDW: 14.4 % (ref 11.5–15.5)
WBC: 6.1 10*3/uL (ref 4.0–10.5)

## 2017-07-29 LAB — TYPE AND SCREEN
ABO/RH(D): O POS
ANTIBODY SCREEN: NEGATIVE

## 2017-07-29 LAB — BASIC METABOLIC PANEL
ANION GAP: 7 (ref 5–15)
BUN: 35 mg/dL — ABNORMAL HIGH (ref 6–20)
CALCIUM: 9 mg/dL (ref 8.9–10.3)
CO2: 27 mmol/L (ref 22–32)
CREATININE: 2.38 mg/dL — AB (ref 0.61–1.24)
Chloride: 108 mmol/L (ref 101–111)
GFR, EST AFRICAN AMERICAN: 30 mL/min — AB (ref >60)
GFR, EST NON AFRICAN AMERICAN: 26 mL/min — AB (ref >60)
Glucose, Bld: 90 mg/dL (ref 65–99)
Potassium: 4.4 mmol/L (ref 3.5–5.1)
Sodium: 142 mmol/L (ref 135–145)

## 2017-07-29 LAB — HEMOGLOBIN A1C
HEMOGLOBIN A1C: 5.9 % — AB (ref 4.8–5.6)
MEAN PLASMA GLUCOSE: 122.63 mg/dL

## 2017-07-29 LAB — GLUCOSE, CAPILLARY: Glucose-Capillary: 88 mg/dL (ref 65–99)

## 2017-07-29 LAB — SURGICAL PCR SCREEN
MRSA, PCR: NEGATIVE
STAPHYLOCOCCUS AUREUS: NEGATIVE

## 2017-07-29 LAB — ABO/RH: ABO/RH(D): O POS

## 2017-07-29 NOTE — Pre-Procedure Instructions (Addendum)
JESON CAMACHO  07/29/2017      [place], [place] [address] Phone: [phone] Fax: [phone]  [facility] [place], [place] [address] [place] [address] Phone: [phone] Fax: [phone]    Your procedure is scheduled on Oct. 1  Report to [facility] at 1230  Call this number if you have problems the morning of surgery:  [phone]   Remember:  Do not eat food or drink liquids after midnight.  Take these medicines the morning of surgery with A SIP OF WATER Allopurinol (Zyloprim), Symbicort inhaler- bring your inhaler with you on the day of surgery, carvedilol (Coreg)  Stop taking aspirin as directed by your Dr. Stop taking BC's, Goody's, Herbal medications, Fish Oil, Ibuprofen, Advil, Motrin, vitamins, Voltaren    How to Manage Your Diabetes Before and After Surgery  Why is it important to control my blood sugar before and after surgery? . Improving blood sugar levels before and after surgery helps healing and can limit problems. . A way of improving blood sugar control is eating a healthy diet by: o  Eating less sugar and carbohydrates o  Increasing activity/exercise o  Talking with your doctor about reaching your blood sugar goals . High blood sugars (greater than 180 mg/dL) can raise your risk of infections and slow your recovery, so you will need to focus on controlling your diabetes during the weeks before surgery. . Make sure that the doctor who takes care of your diabetes knows about your planned surgery including the date and location.  How do I manage my blood sugar before surgery? . Check your blood sugar at least 4 times a day, starting 2 days before surgery, to make sure that the level is not too high or low. o Check your blood sugar the morning of your surgery when you wake up and every 2 hours until you get to the  Short Stay unit. . If your blood sugar is less than 70 mg/dL, you will need to treat for low blood sugar: o Do not take insulin. o Treat a low blood sugar (less than 70 mg/dL) with  cup of clear juice (cranberry or apple), 4 glucose tablets, OR glucose gel. o Recheck blood sugar in 15 minutes after treatment (to make sure it is greater than 70 mg/dL). If your blood sugar is not greater than 70 mg/dL on recheck, call [phone] for further instructions. . Report your blood sugar to the short stay nurse when you get to Short Stay.  . If you are admitted to the hospital after surgery: o Your blood sugar will be checked by the staff and you will probably be given insulin after surgery (instead of oral diabetes medicines) to make sure you have good blood sugar levels. o The goal for blood sugar control after surgery is 80-180 mg/dL.              WHAT DO I DO ABOUT MY DIABETES MEDICATION?   Marland Kitchen Do not take oral diabetes medicines (pills) the morning of surgery. Metformin        . The day of surgery, do not take other diabetes injectables, including Byetta (exenatide), Bydureon (exenatide ER), Victoza (liraglutide), or Trulicity (dulaglutide).  . If your CBG is greater than 220 mg/dL, you may take  of your sliding scale (correction) dose of insulin.  Other Instructions:          Patient Signature:  Date:   Nurse Signature:  Date:   Reviewed and Endorsed by [facility] Patient Education Committee, August 2015  Do not wear jewelry, make-up or nail polish.  Do not wear lotions, powders, or perfumes, or deoderant.  Do not shave 48 hours prior to surgery.  Men may shave face and neck.  Do not bring valuables to the hospital.  [facility] is not responsible for any belongings or valuables.  Contacts, dentures or bridgework may not be worn into surgery.  Leave your suitcase in the car.  After surgery it may be brought to your room.  For patients admitted to the hospital,  discharge time will be determined by your treatment team.  Patients discharged the day of surgery will not be allowed to drive home.  Special instructions:  [facility] - Preparing for Surgery  Before surgery, you can play an important role.  Because skin is not sterile, your skin needs to be as free of germs as possible.  You can reduce the number of germs on you skin by washing with CHG (chlorahexidine gluconate) soap before surgery.  CHG is an antiseptic cleaner which kills germs and bonds with the skin to continue killing germs even after washing.  Please DO NOT use if you have an allergy to CHG or antibacterial soaps.  If your skin becomes reddened/irritated stop using the CHG and inform your nurse when you arrive at Short Stay.  Do not shave (including legs and underarms) for at least 48 hours prior to the first CHG shower.  You may shave your face.  Please follow these instructions carefully:   1.  Shower with CHG Soap the night before surgery and the                                morning of Surgery.  2.  If you choose to wash your hair, wash your hair first as usual with your       normal shampoo.  3.  After you shampoo, rinse your hair and body thoroughly to remove the                      Shampoo.  4.  Use CHG as you would any other liquid soap.  You can apply chg directly       to the skin and wash gently with scrungie or a clean washcloth.  5.  Apply the CHG Soap to your body ONLY FROM THE NECK DOWN.        Do not use on open wounds or open sores.  Avoid contact with your eyes,  ears, mouth and genitals (private parts).  Wash genitals (private parts)       with your normal soap.  6.  Wash thoroughly, paying special attention to the area where your surgery        will be performed.  7.  Thoroughly rinse your body with warm water from the neck down.  8.  DO NOT shower/wash with your normal soap after using and rinsing off       the CHG Soap.  9.  Pat yourself dry with a clean towel.             10.  Wear clean pajamas.            11.  Place clean  sheets on your bed the night of your first shower and do not        sleep with pets.  Day of Surgery  Do not apply any lotions/deoderants the morning of surgery.  Please wear clean clothes to the hospital/surgery center.     Please read over the following fact sheets that you were given. Pain Booklet, Coughing and Deep Breathing, MRSA Information and Surgical Site Infection Prevention

## 2017-07-29 NOTE — Progress Notes (Signed)
   07/29/17 1408  OBSTRUCTIVE SLEEP APNEA  Have you ever been diagnosed with sleep apnea through a sleep study? No  Do you snore loudly (loud enough to be heard through closed doors)?  0  Do you often feel tired, fatigued, or sleepy during the daytime (such as falling asleep during driving or talking to someone)? 0  Has anyone observed you stop breathing during your sleep? 0  Do you have, or are you being treated for high blood pressure? 1  BMI more than 35 kg/m2? 1  Age > 50 (1-yes) 1  Neck circumference greater than:Male 16 inches or larger, Male 17inches or larger? 1  Male Gender (Yes=1) 1  Obstructive Sleep Apnea Score 5  Score 5 or greater  Results sent to PCP

## 2017-08-01 ENCOUNTER — Encounter (HOSPITAL_COMMUNITY): Payer: Self-pay

## 2017-08-01 NOTE — Progress Notes (Addendum)
Anesthesia Chart Review: Patient is a 71 year old male scheduled for left TKA on 08/08/17 by Dr. Rod Can.  History includes former smoker (quit '86), HTN, DM2, CKD, non-ischemic cardiomyopathy '04, prostate cancer, hernia repair, arthritis. No significant CAD by 2004 cath. BMI is consistent with obesity. OSA screening score was 5.   - PCP is Dr. Monico Blitz with [facility]. - Cardiologist is Dr. Carlyle Dolly, last visit 06/30/17 with Jory Sims, DNP. Patient doing well from a CV standpoint, but did have a new right BBB on EKG. This was discussed with Dr. Harl Bowie who agreed that "patient can proceed with total left knee replacement."   Meds include allopurinol, aspirin 81 mg, Lipitor, benazepril, Symbicort, Bumex, Coreg, metformin.  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 Heart rate 71 bpm. Right BBB is new when compared to 10/07/16 tracing.  Echo 06/01/10 [facility]; see Results Review tab): Conclusion: Normal resting LV systolic function, moderate left atrial enlargement, otherwise normal chamber sizes, and aortic root. No pericardial effusion. Normal mitral, tricuspid, aortic, and pulmonic valves with trace mitral, trace tricuspid, and trace pulmonic regurgitation. No aortic stenosis or insufficiency. Mild LVH. Estimated LVEF 60%.  Cardiac cath 05/14/03: CONCLUSION: 1. Severe left ventricular dysfunction with ejection fraction of 25%. 2. Normal coronary angiography. RECOMMENDATIONS:  The patient appears to have a nonischemic cardiomyopathy with severe depression of left ventricular systolic function and markedly elevated pulmonary wedge pressures.  Will plan medical treatment.  Aorta U/S 12/29/16: Impressions: Normal caliber abdominal aorta, common and external iliac arteries,  without focal dilatation. Aort-iliac atherosclerosis, without focal stenosis. IVC patent.   Preoperative labs noted. H/H 12.6/39.4. PLT 211. Glucose 90. A1c 5.9. BUN 35, Cr 2.38. T&S done. Currently, I do not have any recent comparison labs, but according to 02/2016 and 09/2014 labs scanned under Media tab, Cr ~ 1.8-1.90 then. I will forward labs to Dr. Manuella Ghazi for review in hopes to clarify the stability of patient's renal function and determine if any additional preoperative recommendations.  George Hugh [facility] Short Stay Center/Anesthesiology Phone [phone] 08/01/2017 2:09 PM  Addendum: I spoke with Dr. Manuella Ghazi. He reviewed labs done on 07/29/17. He reports patient's BUN/Cr at his office in 10/2016 was 29/1.9. Although BUN/Cr are a little above patient's baseline, he feels patient can proceed with surgery with close monitoring of renal function. He is however, going to call patient and have him decrease Bumex to once tablet daily, and see if he can come in to discuss possibility of discontinuing metformin and starting him on insulin. I am out of the office on 08/05/17, but I will leave chart for staff to follow-up with patient. If he was started on insulin then he will need preoperative instructions and also medication list updated.        George Hugh [facility] Short Stay Center/Anesthesiology Phone [phone] 08/03/2017 3:35 PM  Addendum:   I spoke with pt and his wife.  Pt was seen by Dr. Manuella Ghazi today (08/05/17).  HbA1c was 6.0 today. Bumex was reduced to once daily.  Metformin was stopped.  Pt was prescribed rapid acting insulin Fiasp to take once a day on a sliding scale.  Pt was planning on starting  Fiasp qHS starting tonight.  I instructed pt to contact Dr. Trena Platt office to clarify dosing and timing of Fiasp as rapid acting insulins are not usually taken at bedtime or only once per day.  I also spoke with Otila Kluver in Dr. Trena Platt office and asked that office to call pt and clarify  timing and frequency of Fiasp dosing.    I notified Margarita Grizzle in Dr. Sid Falcon office of these changes.   I will leave chart for RN to contact pt over the weekend prior to surgery Monday to clarify with pt insulin use and what to take for the morning of surgery.    I will recheck a BMET DOS.  If labs acceptable, I anticipate pt can proceed with surgery as scheduled.   Willeen Cass, FNP-BC [facility] Short Stay Surgical Center/Anesthesiology Phone: [phone] 08/05/2017 3:39 PM

## 2017-08-05 DIAGNOSIS — N183 Chronic kidney disease, stage 3 (moderate): Secondary | ICD-10-CM | POA: Diagnosis not present

## 2017-08-05 DIAGNOSIS — C61 Malignant neoplasm of prostate: Secondary | ICD-10-CM | POA: Diagnosis not present

## 2017-08-05 DIAGNOSIS — E1165 Type 2 diabetes mellitus with hyperglycemia: Secondary | ICD-10-CM | POA: Diagnosis not present

## 2017-08-05 DIAGNOSIS — E7800 Pure hypercholesterolemia, unspecified: Secondary | ICD-10-CM | POA: Diagnosis not present

## 2017-08-05 DIAGNOSIS — Z6835 Body mass index (BMI) 35.0-35.9, adult: Secondary | ICD-10-CM | POA: Diagnosis not present

## 2017-08-05 DIAGNOSIS — I10 Essential (primary) hypertension: Secondary | ICD-10-CM | POA: Diagnosis not present

## 2017-08-05 DIAGNOSIS — Z299 Encounter for prophylactic measures, unspecified: Secondary | ICD-10-CM | POA: Diagnosis not present

## 2017-08-05 DIAGNOSIS — J449 Chronic obstructive pulmonary disease, unspecified: Secondary | ICD-10-CM | POA: Diagnosis not present

## 2017-08-05 DIAGNOSIS — I429 Cardiomyopathy, unspecified: Secondary | ICD-10-CM | POA: Diagnosis not present

## 2017-08-05 DIAGNOSIS — E1122 Type 2 diabetes mellitus with diabetic chronic kidney disease: Secondary | ICD-10-CM | POA: Diagnosis not present

## 2017-08-05 MED ORDER — TRANEXAMIC ACID 1000 MG/10ML IV SOLN
1000.0000 mg | INTRAVENOUS | Status: DC
  Filled 2017-08-05: qty 10

## 2017-08-06 NOTE — Progress Notes (Addendum)
Followed up with patient about insulin regimen. Added insulin to medication list. Per patient he is waiting on clarification from prescriber to determine how often and when he should be taking his insulin. I advised patient to call Monday morning if CBG > 220 and that we would provide instructions.

## 2017-08-08 ENCOUNTER — Inpatient Hospital Stay (HOSPITAL_COMMUNITY): Payer: Medicare Other | Admitting: Certified Registered Nurse Anesthetist

## 2017-08-08 ENCOUNTER — Inpatient Hospital Stay (HOSPITAL_COMMUNITY): Payer: Medicare Other | Admitting: Vascular Surgery

## 2017-08-08 ENCOUNTER — Inpatient Hospital Stay (HOSPITAL_COMMUNITY)
Admission: RE | Admit: 2017-08-08 | Discharge: 2017-08-10 | DRG: 470 | Disposition: A | Discharge status: Home / Self Care | Payer: Medicare Other | Source: Ambulatory Visit | Attending: Orthopedic Surgery | Admitting: Orthopedic Surgery

## 2017-08-08 ENCOUNTER — Encounter (HOSPITAL_COMMUNITY)
Admission: RE | Disposition: A | Discharge status: Home / Self Care | Payer: Self-pay | Source: Ambulatory Visit | Attending: Orthopedic Surgery

## 2017-08-08 ENCOUNTER — Inpatient Hospital Stay (HOSPITAL_COMMUNITY): Payer: Medicare Other

## 2017-08-08 ENCOUNTER — Encounter (HOSPITAL_COMMUNITY): Payer: Self-pay

## 2017-08-08 DIAGNOSIS — Z87891 Personal history of nicotine dependence: Secondary | ICD-10-CM

## 2017-08-08 DIAGNOSIS — Z471 Aftercare following joint replacement surgery: Secondary | ICD-10-CM | POA: Diagnosis not present

## 2017-08-08 DIAGNOSIS — I428 Other cardiomyopathies: Secondary | ICD-10-CM | POA: Diagnosis not present

## 2017-08-08 DIAGNOSIS — Z6835 Body mass index (BMI) 35.0-35.9, adult: Secondary | ICD-10-CM

## 2017-08-08 DIAGNOSIS — I129 Hypertensive chronic kidney disease with stage 1 through stage 4 chronic kidney disease, or unspecified chronic kidney disease: Secondary | ICD-10-CM | POA: Diagnosis present

## 2017-08-08 DIAGNOSIS — Z09 Encounter for follow-up examination after completed treatment for conditions other than malignant neoplasm: Secondary | ICD-10-CM

## 2017-08-08 DIAGNOSIS — E1122 Type 2 diabetes mellitus with diabetic chronic kidney disease: Secondary | ICD-10-CM | POA: Diagnosis not present

## 2017-08-08 DIAGNOSIS — D62 Acute posthemorrhagic anemia: Secondary | ICD-10-CM | POA: Diagnosis not present

## 2017-08-08 DIAGNOSIS — Z8546 Personal history of malignant neoplasm of prostate: Secondary | ICD-10-CM

## 2017-08-08 DIAGNOSIS — I340 Nonrheumatic mitral (valve) insufficiency: Secondary | ICD-10-CM | POA: Diagnosis not present

## 2017-08-08 DIAGNOSIS — Z794 Long term (current) use of insulin: Secondary | ICD-10-CM

## 2017-08-08 DIAGNOSIS — M1712 Unilateral primary osteoarthritis, left knee: Secondary | ICD-10-CM | POA: Diagnosis present

## 2017-08-08 DIAGNOSIS — N183 Chronic kidney disease, stage 3 unspecified: Secondary | ICD-10-CM | POA: Diagnosis present

## 2017-08-08 DIAGNOSIS — G8918 Other acute postprocedural pain: Secondary | ICD-10-CM | POA: Diagnosis not present

## 2017-08-08 DIAGNOSIS — E669 Obesity, unspecified: Secondary | ICD-10-CM | POA: Diagnosis present

## 2017-08-08 DIAGNOSIS — Z79899 Other long term (current) drug therapy: Secondary | ICD-10-CM | POA: Diagnosis not present

## 2017-08-08 DIAGNOSIS — I4729 Other ventricular tachycardia: Secondary | ICD-10-CM

## 2017-08-08 DIAGNOSIS — N189 Chronic kidney disease, unspecified: Secondary | ICD-10-CM | POA: Diagnosis not present

## 2017-08-08 DIAGNOSIS — I472 Ventricular tachycardia: Secondary | ICD-10-CM

## 2017-08-08 DIAGNOSIS — Z96652 Presence of left artificial knee joint: Secondary | ICD-10-CM | POA: Diagnosis not present

## 2017-08-08 DIAGNOSIS — M25562 Pain in left knee: Secondary | ICD-10-CM | POA: Diagnosis not present

## 2017-08-08 HISTORY — PX: KNEE ARTHROPLASTY: SHX992

## 2017-08-08 LAB — POCT I-STAT 4, (NA,K, GLUC, HGB,HCT)
Glucose, Bld: 89 mg/dL (ref 65–99)
HCT: 32 % — ABNORMAL LOW (ref 39.0–52.0)
Hemoglobin: 10.9 g/dL — ABNORMAL LOW (ref 13.0–17.0)
Potassium: 3.9 mmol/L (ref 3.5–5.1)
Sodium: 147 mmol/L — ABNORMAL HIGH (ref 135–145)

## 2017-08-08 LAB — MAGNESIUM: MAGNESIUM: 1.7 mg/dL (ref 1.7–2.4)

## 2017-08-08 LAB — GLUCOSE, CAPILLARY
Glucose-Capillary: 99 mg/dL (ref 65–99)
Glucose-Capillary: 82 mg/dL (ref 65–99)

## 2017-08-08 SURGERY — ARTHROPLASTY, KNEE, TOTAL, USING IMAGELESS COMPUTER-ASSISTED NAVIGATION
Anesthesia: Spinal | Site: Knee | Laterality: Left

## 2017-08-08 MED ORDER — ACETAMINOPHEN 650 MG RE SUPP: 650.0000 mg | Freq: Four times a day (QID) | RECTAL | Status: DC | PRN

## 2017-08-08 MED ORDER — ALLOPURINOL 300 MG PO TABS
300.0000 mg | ORAL_TABLET | ORAL | Status: DC
  Administered 2017-08-09 – 2017-08-10 (×2): 300 mg via ORAL
  Filled 2017-08-08 (×2): qty 1

## 2017-08-08 MED ORDER — MIDAZOLAM HCL 5 MG/5ML IJ SOLN
INTRAMUSCULAR | Status: DC | PRN
  Administered 2017-08-08: 2 mg via INTRAVENOUS

## 2017-08-08 MED ORDER — EPHEDRINE SULFATE 50 MG/ML IJ SOLN
INTRAMUSCULAR | Status: DC | PRN
  Administered 2017-08-08: 5 mg via INTRAVENOUS

## 2017-08-08 MED ORDER — ALBUMIN HUMAN 5 % IV SOLN
INTRAVENOUS | Status: DC | PRN
  Administered 2017-08-08: 15:00:00 via INTRAVENOUS

## 2017-08-08 MED ORDER — DEXAMETHASONE SODIUM PHOSPHATE 10 MG/ML IJ SOLN
10.0000 mg | Freq: Once | INTRAMUSCULAR | Status: AC
  Administered 2017-08-09: 10 mg via INTRAVENOUS
  Filled 2017-08-08: qty 1

## 2017-08-08 MED ORDER — PROPOFOL 10 MG/ML IV BOLUS
INTRAVENOUS | Status: DC | PRN
  Administered 2017-08-08: 20 mg via INTRAVENOUS

## 2017-08-08 MED ORDER — MIDAZOLAM HCL 2 MG/2ML IJ SOLN
INTRAMUSCULAR | Status: AC
  Administered 2017-08-08: 1 mg via INTRAVENOUS
  Filled 2017-08-08: qty 2

## 2017-08-08 MED ORDER — PROPOFOL 500 MG/50ML IV EMUL
INTRAVENOUS | Status: DC | PRN
  Administered 2017-08-08: 75 ug/kg/min via INTRAVENOUS

## 2017-08-08 MED ORDER — ACETAMINOPHEN 325 MG PO TABS: 650.0000 mg | ORAL_TABLET | Freq: Four times a day (QID) | ORAL | Status: DC | PRN

## 2017-08-08 MED ORDER — DIPHENHYDRAMINE HCL 12.5 MG/5ML PO ELIX: 12.5000 mg | ORAL_SOLUTION | ORAL | Status: DC | PRN

## 2017-08-08 MED ORDER — PROPOFOL 1000 MG/100ML IV EMUL
INTRAVENOUS | Status: AC
  Filled 2017-08-08: qty 200

## 2017-08-08 MED ORDER — PHENYLEPHRINE HCL 10 MG/ML IJ SOLN
INTRAMUSCULAR | Status: DC | PRN
  Administered 2017-08-08: 120 ug via INTRAVENOUS
  Administered 2017-08-08: 160 ug via INTRAVENOUS
  Administered 2017-08-08: 120 ug via INTRAVENOUS

## 2017-08-08 MED ORDER — ACETAMINOPHEN 10 MG/ML IV SOLN: 1000.0000 mg | INTRAVENOUS | Status: DC

## 2017-08-08 MED ORDER — KETOROLAC TROMETHAMINE 30 MG/ML IJ SOLN
INTRAMUSCULAR | Status: AC
  Filled 2017-08-08: qty 1

## 2017-08-08 MED ORDER — ASPIRIN 81 MG PO CHEW
81.0000 mg | CHEWABLE_TABLET | Freq: Two times a day (BID) | ORAL | Status: DC
  Administered 2017-08-08 – 2017-08-10 (×4): 81 mg via ORAL
  Filled 2017-08-08 (×4): qty 1

## 2017-08-08 MED ORDER — MIDAZOLAM HCL 2 MG/2ML IJ SOLN
INTRAMUSCULAR | Status: AC
  Filled 2017-08-08: qty 2

## 2017-08-08 MED ORDER — BUMETANIDE 2 MG PO TABS
2.0000 mg | ORAL_TABLET | ORAL | Status: DC
  Administered 2017-08-09 – 2017-08-10 (×2): 2 mg via ORAL
  Filled 2017-08-08 (×3): qty 1

## 2017-08-08 MED ORDER — METHOCARBAMOL 1000 MG/10ML IJ SOLN
500.0000 mg | Freq: Four times a day (QID) | INTRAVENOUS | Status: DC | PRN
  Filled 2017-08-08: qty 5

## 2017-08-08 MED ORDER — 0.9 % SODIUM CHLORIDE (POUR BTL) OPTIME
TOPICAL | Status: DC | PRN
  Administered 2017-08-08: 1000 mL

## 2017-08-08 MED ORDER — BUPIVACAINE IN DEXTROSE 0.75-8.25 % IT SOLN
INTRATHECAL | Status: DC | PRN
  Administered 2017-08-08: 2 mL via INTRATHECAL

## 2017-08-08 MED ORDER — ROPIVACAINE HCL 7.5 MG/ML IJ SOLN
INTRAMUSCULAR | Status: DC | PRN
  Administered 2017-08-08 (×3): 5 mL via PERINEURAL
  Administered 2017-08-08: 7 mL via PERINEURAL
  Administered 2017-08-08: 5 mL via PERINEURAL

## 2017-08-08 MED ORDER — ATORVASTATIN CALCIUM 40 MG PO TABS
40.0000 mg | ORAL_TABLET | Freq: Every evening | ORAL | Status: DC
  Administered 2017-08-08 – 2017-08-09 (×2): 40 mg via ORAL
  Filled 2017-08-08 (×2): qty 1

## 2017-08-08 MED ORDER — CEFAZOLIN SODIUM-DEXTROSE 2-4 GM/100ML-% IV SOLN
2.0000 g | Freq: Four times a day (QID) | INTRAVENOUS | Status: AC
  Administered 2017-08-08 – 2017-08-09 (×2): 2 g via INTRAVENOUS
  Filled 2017-08-08 (×2): qty 100

## 2017-08-08 MED ORDER — HYDROCODONE-ACETAMINOPHEN 5-325 MG PO TABS
ORAL_TABLET | ORAL | Status: AC
  Administered 2017-08-08: 2 via ORAL
  Filled 2017-08-08: qty 2

## 2017-08-08 MED ORDER — CHLORHEXIDINE GLUCONATE 4 % EX LIQD: 60.0000 mL | Freq: Once | CUTANEOUS | Status: DC

## 2017-08-08 MED ORDER — SENNA 8.6 MG PO TABS
2.0000 | ORAL_TABLET | Freq: Every day | ORAL | Status: DC
  Administered 2017-08-08 – 2017-08-09 (×2): 17.2 mg via ORAL
  Filled 2017-08-08 (×2): qty 2

## 2017-08-08 MED ORDER — SODIUM CHLORIDE 0.9 % IJ SOLN
INTRAMUSCULAR | Status: DC | PRN
  Administered 2017-08-08: 10 mL

## 2017-08-08 MED ORDER — HYDROMORPHONE HCL 1 MG/ML IJ SOLN
0.5000 mg | INTRAMUSCULAR | Status: DC | PRN
  Administered 2017-08-09: 2 mg via INTRAVENOUS
  Filled 2017-08-08: qty 2

## 2017-08-08 MED ORDER — FLUTICASONE FUROATE-VILANTEROL 200-25 MCG/INH IN AEPB
1.0000 | INHALATION_SPRAY | Freq: Every day | RESPIRATORY_TRACT | Status: DC
  Administered 2017-08-09 – 2017-08-10 (×2): 1 via RESPIRATORY_TRACT
  Filled 2017-08-08: qty 28

## 2017-08-08 MED ORDER — METHOCARBAMOL 500 MG PO TABS
500.0000 mg | ORAL_TABLET | Freq: Four times a day (QID) | ORAL | Status: DC | PRN
  Administered 2017-08-08 – 2017-08-09 (×3): 500 mg via ORAL
  Filled 2017-08-08 (×2): qty 1

## 2017-08-08 MED ORDER — ONDANSETRON HCL 4 MG/2ML IJ SOLN
4.0000 mg | Freq: Four times a day (QID) | INTRAMUSCULAR | Status: DC | PRN
  Administered 2017-08-09: 4 mg via INTRAVENOUS
  Filled 2017-08-08: qty 2

## 2017-08-08 MED ORDER — CEFAZOLIN SODIUM 10 G IJ SOLR
3.0000 g | INTRAMUSCULAR | Status: AC
  Administered 2017-08-08: 3 g via INTRAVENOUS
  Filled 2017-08-08: qty 3000

## 2017-08-08 MED ORDER — BENAZEPRIL HCL 20 MG PO TABS
40.0000 mg | ORAL_TABLET | ORAL | Status: DC
  Administered 2017-08-09 – 2017-08-10 (×2): 40 mg via ORAL
  Filled 2017-08-08 (×2): qty 2

## 2017-08-08 MED ORDER — ALUM & MAG HYDROXIDE-SIMETH 200-200-20 MG/5ML PO SUSP: 30.0000 mL | ORAL | Status: DC | PRN

## 2017-08-08 MED ORDER — TRANEXAMIC ACID 1000 MG/10ML IV SOLN
INTRAVENOUS | Status: DC | PRN
  Administered 2017-08-08: 1000 mg via INTRAVENOUS

## 2017-08-08 MED ORDER — PHENOL 1.4 % MT LIQD: 1.0000 | OROMUCOSAL | Status: DC | PRN

## 2017-08-08 MED ORDER — SODIUM CHLORIDE 0.9 % IV SOLN: INTRAVENOUS | Status: DC

## 2017-08-08 MED ORDER — METHOCARBAMOL 500 MG PO TABS
ORAL_TABLET | ORAL | Status: AC
  Administered 2017-08-08: 500 mg via ORAL
  Filled 2017-08-08: qty 1

## 2017-08-08 MED ORDER — ACETAMINOPHEN 10 MG/ML IV SOLN: 1000.0000 mg | Freq: Once | INTRAVENOUS | Status: DC | PRN

## 2017-08-08 MED ORDER — MIDAZOLAM HCL 2 MG/2ML IJ SOLN
1.0000 mg | Freq: Once | INTRAMUSCULAR | Status: AC
  Administered 2017-08-08: 1 mg via INTRAVENOUS

## 2017-08-08 MED ORDER — MEPERIDINE HCL 25 MG/ML IJ SOLN: 6.2500 mg | INTRAMUSCULAR | Status: DC | PRN

## 2017-08-08 MED ORDER — METFORMIN HCL 500 MG PO TABS
500.0000 mg | ORAL_TABLET | Freq: Every day | ORAL | Status: DC
  Administered 2017-08-09: 500 mg via ORAL
  Filled 2017-08-08: qty 1

## 2017-08-08 MED ORDER — ROCURONIUM BROMIDE 10 MG/ML (PF) SYRINGE
PREFILLED_SYRINGE | INTRAVENOUS | Status: AC
  Filled 2017-08-08: qty 5

## 2017-08-08 MED ORDER — ONDANSETRON HCL 4 MG PO TABS: 4.0000 mg | ORAL_TABLET | Freq: Four times a day (QID) | ORAL | Status: DC | PRN

## 2017-08-08 MED ORDER — DOCUSATE SODIUM 100 MG PO CAPS
100.0000 mg | ORAL_CAPSULE | Freq: Two times a day (BID) | ORAL | Status: DC
  Administered 2017-08-08 – 2017-08-10 (×4): 100 mg via ORAL
  Filled 2017-08-08 (×4): qty 1

## 2017-08-08 MED ORDER — BUPIVACAINE-EPINEPHRINE (PF) 0.5% -1:200000 IJ SOLN
INTRAMUSCULAR | Status: AC
  Filled 2017-08-08: qty 30

## 2017-08-08 MED ORDER — MENTHOL 3 MG MT LOZG: 1.0000 | LOZENGE | OROMUCOSAL | Status: DC | PRN

## 2017-08-08 MED ORDER — FENTANYL CITRATE (PF) 100 MCG/2ML IJ SOLN
INTRAMUSCULAR | Status: AC
  Administered 2017-08-08: 50 ug via INTRAVENOUS
  Filled 2017-08-08: qty 2

## 2017-08-08 MED ORDER — LIDOCAINE 2% (20 MG/ML) 5 ML SYRINGE
INTRAMUSCULAR | Status: AC
  Filled 2017-08-08: qty 10

## 2017-08-08 MED ORDER — FENTANYL CITRATE (PF) 100 MCG/2ML IJ SOLN
50.0000 ug | Freq: Once | INTRAMUSCULAR | Status: AC
  Administered 2017-08-08: 50 ug via INTRAVENOUS

## 2017-08-08 MED ORDER — LACTATED RINGERS IV SOLN
INTRAVENOUS | Status: DC | PRN
  Administered 2017-08-08 (×2): via INTRAVENOUS

## 2017-08-08 MED ORDER — METOCLOPRAMIDE HCL 5 MG PO TABS: 5.0000 mg | ORAL_TABLET | Freq: Three times a day (TID) | ORAL | Status: DC | PRN

## 2017-08-08 MED ORDER — INSULIN ASPART 100 UNIT/ML ~~LOC~~ SOLN
0.0000 [IU] | Freq: Three times a day (TID) | SUBCUTANEOUS | Status: DC
  Administered 2017-08-09: 5 [IU] via SUBCUTANEOUS
  Administered 2017-08-09: 3 [IU] via SUBCUTANEOUS
  Administered 2017-08-10: 2 [IU] via SUBCUTANEOUS

## 2017-08-08 MED ORDER — CARVEDILOL 25 MG PO TABS
25.0000 mg | ORAL_TABLET | Freq: Two times a day (BID) | ORAL | Status: DC
  Administered 2017-08-08 – 2017-08-10 (×4): 25 mg via ORAL
  Filled 2017-08-08 (×4): qty 1

## 2017-08-08 MED ORDER — METOCLOPRAMIDE HCL 5 MG/ML IJ SOLN: 5.0000 mg | Freq: Three times a day (TID) | INTRAMUSCULAR | Status: DC | PRN

## 2017-08-08 MED ORDER — POVIDONE-IODINE 10 % EX SWAB: 2.0000 "application " | Freq: Once | CUTANEOUS | Status: DC

## 2017-08-08 MED ORDER — PROPOFOL 10 MG/ML IV BOLUS
INTRAVENOUS | Status: AC
  Filled 2017-08-08: qty 20

## 2017-08-08 MED ORDER — LIDOCAINE HCL (CARDIAC) 20 MG/ML IV SOLN
INTRAVENOUS | Status: DC | PRN
  Administered 2017-08-08: 100 mg via INTRAVENOUS

## 2017-08-08 MED ORDER — PHENYLEPHRINE HCL 10 MG/ML IJ SOLN
INTRAVENOUS | Status: DC | PRN
  Administered 2017-08-08: 25 ug/min via INTRAVENOUS

## 2017-08-08 MED ORDER — BUPIVACAINE-EPINEPHRINE (PF) 0.5% -1:200000 IJ SOLN
INTRAMUSCULAR | Status: DC | PRN
  Administered 2017-08-08: 30 mL via PERINEURAL

## 2017-08-08 MED ORDER — HYDROMORPHONE HCL 1 MG/ML IJ SOLN: 0.2500 mg | INTRAMUSCULAR | Status: DC | PRN

## 2017-08-08 MED ORDER — PROMETHAZINE HCL 25 MG/ML IJ SOLN: 6.2500 mg | INTRAMUSCULAR | Status: DC | PRN

## 2017-08-08 MED ORDER — FENTANYL CITRATE (PF) 250 MCG/5ML IJ SOLN
INTRAMUSCULAR | Status: AC
  Filled 2017-08-08: qty 5

## 2017-08-08 MED ORDER — SODIUM CHLORIDE 0.9 % IV SOLN
INTRAVENOUS | Status: DC
  Administered 2017-08-08: 13:00:00 via INTRAVENOUS

## 2017-08-08 MED ORDER — KETOROLAC TROMETHAMINE 30 MG/ML IJ SOLN
INTRAMUSCULAR | Status: DC | PRN
  Administered 2017-08-08: 30 mg

## 2017-08-08 MED ORDER — HYDROCODONE-ACETAMINOPHEN 5-325 MG PO TABS
1.0000 | ORAL_TABLET | ORAL | Status: DC | PRN
  Administered 2017-08-08 – 2017-08-10 (×6): 2 via ORAL
  Filled 2017-08-08 (×5): qty 2

## 2017-08-08 MED ORDER — SODIUM CHLORIDE 0.9 % IR SOLN
Status: DC | PRN
  Administered 2017-08-08: 3000 mL

## 2017-08-08 SURGICAL SUPPLY — 46 items
ALCOHOL ISOPROPYL (RUBBING) (MISCELLANEOUS) ×2 IMPLANT
BANDAGE ACE 6X5 VEL STRL LF (GAUZE/BANDAGES/DRESSINGS) ×2 IMPLANT
BATTERY INSTRU NAVIGATION (MISCELLANEOUS) ×6 IMPLANT
BLADE SAW RECIP 87.9 MT (BLADE) ×2 IMPLANT
BNDG ELASTIC 6X10 VLCR STRL LF (GAUZE/BANDAGES/DRESSINGS) ×2 IMPLANT
CAPT KNEE TRIATH TK-4 ×2 IMPLANT
CHLORAPREP W/TINT 26ML (MISCELLANEOUS) ×4 IMPLANT
CUFF TOURNIQUET SINGLE 34IN LL (TOURNIQUET CUFF) ×2 IMPLANT
DERMABOND ADVANCED (GAUZE/BANDAGES/DRESSINGS) ×1
DERMABOND ADVANCED .7 DNX12 (GAUZE/BANDAGES/DRESSINGS) ×1 IMPLANT
DRAIN HEMOVAC 7FR (DRAIN) IMPLANT
DRAPE EXTREMITY T 121X128X90 (DRAPE) ×2 IMPLANT
DRAPE U-SHAPE 47X51 STRL (DRAPES) ×2 IMPLANT
DRAPE UNIVERSAL PACK (DRAPES) ×2 IMPLANT
DRSG AQUACEL AG ADV 3.5X10 (GAUZE/BANDAGES/DRESSINGS) ×2 IMPLANT
DRSG AQUACEL AG ADV 3.5X14 (GAUZE/BANDAGES/DRESSINGS) ×2 IMPLANT
ELECT BLADE 4.0 EZ CLEAN MEGAD (MISCELLANEOUS) ×2
ELECT REM PT RETURN 9FT ADLT (ELECTROSURGICAL) ×2
ELECTRODE BLDE 4.0 EZ CLN MEGD (MISCELLANEOUS) ×1 IMPLANT
ELECTRODE REM PT RTRN 9FT ADLT (ELECTROSURGICAL) ×1 IMPLANT
EVACUATOR 1/8 PVC DRAIN (DRAIN) IMPLANT
GLOVE BIO SURGEON STRL SZ8.5 (GLOVE) ×8 IMPLANT
GLOVE BIOGEL PI IND STRL 8.5 (GLOVE) ×1 IMPLANT
GLOVE BIOGEL PI INDICATOR 8.5 (GLOVE) ×1
GOWN STRL REUS W/TWL 2XL LVL3 (GOWN DISPOSABLE) ×4 IMPLANT
HANDPIECE INTERPULSE COAX TIP (DISPOSABLE) ×1
HOOD PEEL AWAY FLYTE STAYCOOL (MISCELLANEOUS) ×4 IMPLANT
KIT BASIN OR (CUSTOM PROCEDURE TRAY) ×2 IMPLANT
MANIFOLD NEPTUNE II (INSTRUMENTS) ×2 IMPLANT
NEEDLE SPNL 18GX3.5 QUINCKE PK (NEEDLE) IMPLANT
PACK TOTAL JOINT (CUSTOM PROCEDURE TRAY) ×2 IMPLANT
PACK TOTAL KNEE CUSTOM (KITS) ×2 IMPLANT
SAW OSC TIP CART 19.5X105X1.3 (SAW) ×2 IMPLANT
SEALER BIPOLAR AQUA 6.0 (INSTRUMENTS) ×2 IMPLANT
SET HNDPC FAN SPRY TIP SCT (DISPOSABLE) ×1 IMPLANT
SET PAD KNEE POSITIONER (MISCELLANEOUS) ×2 IMPLANT
SUT MNCRL AB 3-0 PS2 27 (SUTURE) ×2 IMPLANT
SUT MON AB 2-0 CT1 36 (SUTURE) ×4 IMPLANT
SUT VIC AB 1 CTX 27 (SUTURE) ×4 IMPLANT
SUT VIC AB 2-0 CT1 27 (SUTURE)
SUT VIC AB 2-0 CT1 TAPERPNT 27 (SUTURE) IMPLANT
SUT VLOC 180 0 24IN GS25 (SUTURE) ×2 IMPLANT
SYR 50ML LL SCALE MARK (SYRINGE) ×2 IMPLANT
TOWER CARTRIDGE SMART MIX (DISPOSABLE) ×2 IMPLANT
TRAY CATH 16FR W/PLASTIC CATH (SET/KITS/TRAYS/PACK) IMPLANT
WRAP KNEE MAXI GEL POST OP (GAUZE/BANDAGES/DRESSINGS) ×2 IMPLANT

## 2017-08-08 NOTE — Anesthesia Postprocedure Evaluation (Addendum)
Anesthesia Post Note  Patient: JARRED PURTEE  Procedure(s) Performed: LEFT TOTAL KNEE ARTHROPLASTY WITH COMPUTER NAVIGATION (Left Knee)     Patient location during evaluation: PACU Anesthesia Type: Spinal Level of consciousness: oriented and awake and alert Pain management: pain level controlled Vital Signs Assessment: post-procedure vital signs reviewed and stable Respiratory status: spontaneous breathing, respiratory function stable and patient connected to nasal cannula oxygen Cardiovascular status: blood pressure returned to baseline and stable Postop Assessment: no headache, no backache and no apparent nausea or vomiting Anesthetic complications: no Comments: Cardiology consult for runs of V tach in PACU.    Last Vitals:  Vitals:   08/08/17 1420 08/08/17 1715  BP: 134/86 110/64  Pulse: 64 68  Resp: 10 16  Temp:  (!) 36.4 C  SpO2: 100%     Last Pain:  Vitals:   08/08/17 1715  TempSrc:   PainSc: 0-No pain                 Cylee Dattilo DAVID

## 2017-08-08 NOTE — Transfer of Care (Signed)
Immediate Anesthesia Transfer of Care Note  Patient: Todd Robertson  Procedure(s) Performed: LEFT TOTAL KNEE ARTHROPLASTY WITH COMPUTER NAVIGATION (Left Knee)  Patient Location: PACU  Anesthesia Type:Spinal  Level of Consciousness: awake, alert  and oriented  Airway & Oxygen Therapy: Patient Spontanous Breathing  Post-op Assessment: Report given to RN and Post -op Vital signs reviewed and stable  Post vital signs: Reviewed and stable  Last Vitals:  Vitals:   08/08/17 1415 08/08/17 1420  BP: (!) 144/85 134/86  Pulse: 62 64  Resp: 17 10  Temp:    SpO2: 100% 100%    Last Pain:  Vitals:   08/08/17 1154  TempSrc: Oral      Patients Stated Pain Goal: 3 (49/20/10 0712)  Complications: No apparent anesthesia complications. Cardiology at bedside for consult.

## 2017-08-08 NOTE — Anesthesia Procedure Notes (Signed)
Spinal  Start time: 08/08/2017 2:35 PM End time: 08/08/2017 2:39 PM Staffing Anesthesiologist: Lyn Hollingshead Performed: anesthesiologist  Preanesthetic Checklist Completed: patient identified, surgical consent, pre-op evaluation, timeout performed, IV checked, risks and benefits discussed and monitors and equipment checked Spinal Block Patient position: sitting Prep: site prepped and draped and DuraPrep Patient monitoring: heart rate, cardiac monitor, continuous pulse ox and blood pressure Approach: midline Location: L3-4 Injection technique: single-shot Needle Needle type: Pencan  Needle length: 10 cm Needle insertion depth: 8 cm Assessment Sensory level: T6

## 2017-08-08 NOTE — Op Note (Signed)
OPERATIVE REPORT  SURGEON: Rod Can, MD   ASSISTANT: Ky Barban, RNFA  PREOPERATIVE DIAGNOSIS: Left knee arthritis.   POSTOPERATIVE DIAGNOSIS: Left knee arthritis.   PROCEDURE: Left total knee arthroplasty.   IMPLANTS: Stryker Triathlon CR femur, size 8. Stryker Tritanium tibia, size 7. X3 polyethelyene insert, size 11 mm, CR. 3 button asymmetric patella, size 40 mm.  ANESTHESIA:  Regional and Spinal  TOURNIQUET TIME: Not utilized.   ESTIMATED BLOOD LOSS: 50 0 mL.  ANTIBIOTICS: 2 g Ancef.  DRAINS: None.  COMPLICATIONS: None   CONDITION: PACU - hemodynamically stable.   BRIEF CLINICAL NOTE: Todd Robertson is a 71 y.o. male with a long-standing history of Left knee arthritis. After failing conservative management, the patient was indicated for total knee arthroplasty. The risks, benefits, and alternatives to the procedure were explained, and the patient elected to proceed.  PROCEDURE IN DETAIL: Adductor canal block was obtained in the pre-op holding area. Once inside the operative room, spinal anesthesia was obtained, and a foley catheter was inserted. The patient was then positioned, a nonsterile tourniquet was placed, and the lower extremity was prepped and draped in the normal sterile surgical fashion. A time-out was called verifying side and site of surgery. The patient received IV antibiotics within 60 minutes of beginning the procedure. The tourniquet was not utilized.  An anterior approach to the knee was performed utilizing a midvastus arthrotomy. A medial release was performed and the patellar fat pad was excised. Stryker navigation was used to cut the distal femur perpendicular to the mechanical axis. A freehand patellar resection was performed, and the patella was sized an prepared with 3 lug holes.  Nagivation was used to make a neutral proximal tibia  resection, taking 9 mm of bone from the less affected lateral side with 3 degrees of slope. The menisci were excised. A spacer block was placed, and the alignment and balance in extension were confirmed.   The distal femur was sized using the 3-degree external rotation guide referencing the posterior femoral cortex. The appropriate 4-in-1 cutting block was pinned into place. Rotation was checked using Whiteside's line, the epicondylar axis, and then confirmed with a spacer block in flexion. The remaining femoral cuts were performed, taking care to protect the MCL.  The tibia was sized and the trial tray was pinned into place. The remaining trail components were inserted. The knee was stable to varus and valgus stress through a full range of motion. The patella tracked centrally, and the PCL was well balanced. The trial components were removed, and the proximal tibial surface was prepared. Final components were impacted into place. The knee was tested for a final time and found to be well balanced.  The wound was copiously irrigated with a dilute betadine solution followed by normal saline with pulse lavage. Marcaine solution was injected into the periarticular soft tissue. The wound was closed in layers using #1 Vicryl and Stratafix for the fascia, 2-0 Vicryl for the subcutaneous fat, 2-0 Monocryl for the deep dermal layer, 3-0 running Monocryl subcuticular Stitch, and Dermabond for the skin. Once the glue was fully dried, an Aquacell Ag and compressive dressing were applied. Tthe patient was transported to the recovery room in stable condition. Sponge, needle, and instrument counts were correct at the end of the case x2. The patient tolerated the procedure well and there were no known complications.  Intraoperatively, anesthesia noted that the patient had several nonsustained runs of ventricular tachycardia. He was asymptomatic with stable blood pressures and heart  rate. He does have a history of  nonischemic cardiomyopathy with a recent ejection fracture of 25% and also a right bundle branch block. We will obtain a cardiology consultation. He will be admitted to the hospital on telemetry. We will start him on aspirin 81 mg by mouth twice a day for DVT prophylaxis. He may weight-bear as tolerated with a walker. We will plan to discharge him home with outpatient physical therapy.

## 2017-08-08 NOTE — Anesthesia Preprocedure Evaluation (Addendum)
Anesthesia Evaluation  Patient identified by MRN, date of birth, ID band Patient awake    Reviewed: Allergy & Precautions, NPO status , Patient's Chart, lab work & pertinent test results, reviewed documented beta blocker date and time   Airway Mallampati: III       Dental no notable dental hx. (+) Teeth Intact, Poor Dentition   Pulmonary former smoker,    Pulmonary exam normal breath sounds clear to auscultation       Cardiovascular hypertension, Pt. on medications and Pt. on home beta blockers Normal cardiovascular exam Rhythm:Regular Rate:Normal     Neuro/Psych negative psych ROS   GI/Hepatic negative GI ROS, Neg liver ROS,   Endo/Other  diabetes, Well Controlled, Oral Hypoglycemic Agents, Insulin Dependent  Renal/GU CRFRenal diseasenegative Renal ROS  negative genitourinary   Musculoskeletal   Abdominal (+) + obese,   Peds  Hematology   Anesthesia Other Findings   Reproductive/Obstetrics                            Anesthesia Physical Anesthesia Plan  ASA: III  Anesthesia Plan: General   Post-op Pain Management:    Induction:   PONV Risk Score and Plan: 2 and Ondansetron and Dexamethasone  Airway Management Planned: Oral ETT  Additional Equipment:   Intra-op Plan:   Post-operative Plan: Extubation in OR  Informed Consent: I have reviewed the patients History and Physical, chart, labs and discussed the procedure including the risks, benefits and alternatives for the proposed anesthesia with the patient or authorized representative who has indicated his/her understanding and acceptance.   Dental advisory given  Plan Discussed with: CRNA and Surgeon  Anesthesia Plan Comments:        Anesthesia Quick Evaluation

## 2017-08-08 NOTE — Interval H&P Note (Signed)
History and Physical Interval Note:  08/08/2017 1:57 PM  Todd Robertson  has presented today for surgery, with the diagnosis of Degenerative joint disease left knee  The various methods of treatment have been discussed with the patient and family. After consideration of risks, benefits and other options for treatment, the patient has consented to  Procedure(s) with comments: LEFT TOTAL KNEE ARTHROPLASTY WITH COMPUTER NAVIGATION (Left) - Needs RNFA as a surgical intervention .  The patient's history has been reviewed, patient examined, no change in status, stable for surgery.  I have reviewed the patient's chart and labs.  Questions were answered to the patient's satisfaction.     Shabreka Coulon, Horald Pollen

## 2017-08-08 NOTE — Consult Note (Signed)
Cardiology Consultation:   Patient ID: [facility] BLOODWORTH; [id number]; [birth year]   Admit date: 08/08/2017 Date of Consult: 08/08/2017  Primary Care Provider: Monico Blitz, MD Primary Cardiologist: Dr. Harl Robertson  Primary Electrophysiologist:  n/a   Patient Profile:   Todd Robertson is a 71 y.o. male with a hx of NICM, CKD, DM, and prostate CA who is being seen today for the evaluation of NSVT at the request of Dr. Lyla Robertson.  History of Present Illness:   Todd Robertson is a 71 year old male with a past medical history of NICM, last EF was 65% in 2011. Prior to that EF was 25-30% in 2004. Last cath was in 2004 - normal cors, EF estimated at 25%. Cardiomyopathy cause unclear, possibly due to hypertension. Also with history of RBBB.   Today, underwent left knee arthroplasty. Intraoperatively had some runs of a wide complex rhythm, consistent with RBBB. BP was stable. At the time of my encounter the patient is just waking up, but denies chest pain and SOB. EKG strips with wide complex rhythm, rate of 70-80 bpm.   Past Medical History:  Diagnosis Date  . Arthritis   . Cancer [facility])    prostate  . Cardiomyopathy    secondary  . CKD (chronic kidney disease)   . DM2 (diabetes mellitus, type 2) ([place])   . HTN (hypertension)    unspec  . Nonischemic cardiomyopathy ([place])    a. EF 40-50% by most recent 2-D echo b. EF 25% by cardiac cath in 2004  . Overweight(278.02)     Past Surgical History:  Procedure Laterality Date  . COLONOSCOPY    . HERNIA REPAIR         Inpatient Medications: Scheduled Meds: . chlorhexidine  60 mL Topical Once  . povidone-iodine  2 application Topical Once   Continuous Infusions: . sodium chloride 125 mL/hr at 08/08/17 1313  . acetaminophen    . acetaminophen    . methocarbamol (ROBAXIN)  IV    . tranexamic acid     PRN Meds: acetaminophen, HYDROmorphone (DILAUDID) injection, meperidine (DEMEROL) injection, methocarbamol **OR** methocarbamol (ROBAXIN)   IV, promethazine  Allergies:   No Known Allergies  Social History:   Social History   Social History  . Marital status: Married    Spouse name: N/A  . Number of children: N/A  . Years of education: N/A   Occupational History  . Not on file.   Social History Main Topics  . Smoking status: Former Smoker    Packs/day: 0.50    Years: 20.00    Types: Cigarettes    Start date: 02/12/1966    Quit date: 11/08/1984  . Smokeless tobacco: Never Used  . Alcohol use No  . Drug use: No  . Sexual activity: Not on file   Other Topics Concern  . Not on file   Social History Narrative   Full time.     Family History:   Family History  Problem Relation Age of Onset  . Hypertension Mother      ROS:  Please see the history of present illness.  ROS  All other ROS reviewed and negative.     Physical Exam/Data:   Vitals:   08/08/17 1420 08/08/17 1715 08/08/17 1730 08/08/17 1745  BP: 134/86 110/64 (!) 144/80 119/73  Pulse: 64 68 71 (!) 57  Resp: 10 16 12 13   Temp:  (!) 97.5 F (36.4 C)    TempSrc:      SpO2: 100%  100%  Weight:      Height:        Intake/Output Summary (Last 24 hours) at 08/08/17 1758 Last data filed at 08/08/17 1707  Gross per 24 hour  Intake             1500 ml  Output              500 ml  Net             1000 ml   Filed Weights   08/08/17 1154  Weight: 273 lb (123.8 kg)   Body mass index is 35.05 kg/m.  General:  Well nourished, well developed, in no acute distress HEENT: normal Lymph: no adenopathy Neck: no JVD Endocrine:  No thryomegaly Vascular: No carotid bruits; FA pulses 2+ bilaterally without bruits  Cardiac:  normal S1, S2; RRR; no murmur  Lungs:  clear to auscultation bilaterally, no wheezing, rhonchi or rales  Abd: soft, nontender, no hepatomegaly  Ext: no edema Musculoskeletal:  No deformities, BUE and BLE strength normal and equal Skin: warm and dry  Neuro:  CNs 2-12 intact, no focal abnormalities noted Psych:  Normal affect    EKG:  The EKG was personally reviewed and demonstrates:  NSR, RBBB Telemetry:  Telemetry was personally reviewed and demonstrates:  NSR    Laboratory Data:  Chemistry  Recent Labs Lab 08/08/17 1613  NA 147*  K 3.9  GLUCOSE 89    No results for input(s): PROT, ALBUMIN, AST, ALT, ALKPHOS, BILITOT in the last 168 hours. Hematology  Recent Labs Lab 08/08/17 1613  HGB 10.9*  HCT 32.0*   Cardiac EnzymesNo results for input(s): TROPONINI in the last 168 hours. No results for input(s): TROPIPOC in the last 168 hours.  BNPNo results for input(s): BNP, PROBNP in the last 168 hours.  DDimer No results for input(s): DDIMER in the last 168 hours.  Radiology/Studies:  No results found.  Assessment and Plan:   1. Wide complex rhythm/slow VT: with history of NICM with obtain Echo. Place on tele. Check Mg now and get EKG now.  - If EF is reduced, will need consideration for ICD.  - BMET and Mg in the am.   2. History of NICM - As above, obtain Echo.   3. S/p R knee arthroplasty - per ortho.     For questions or updates, please contact [facility] Please consult [email or web address] for contact info under Cardiology/STEMI.   Signed, Todd Leas, NP  08/08/2017 5:58 PM

## 2017-08-08 NOTE — Discharge Instructions (Signed)
° °Dr. Ellanora Rayborn °Total Joint Specialist °[facility] °[address] °[place], [place] [address] °[phone] ° °TOTAL KNEE REPLACEMENT POSTOPERATIVE DIRECTIONS ° ° ° °Knee Rehabilitation, Guidelines Following Surgery  °Results after knee surgery are often greatly improved when you follow the exercise, range of motion and muscle strengthening exercises prescribed by your doctor. Safety measures are also important to protect the knee from further injury. Any time any of these exercises cause you to have increased pain or swelling in your knee joint, decrease the amount until you are comfortable again and slowly increase them. If you have problems or questions, call your caregiver or physical therapist for advice.  ° °WEIGHT BEARING °Weight bearing as tolerated with assist device (walker, cane, etc) as directed, use it as long as suggested by your surgeon or therapist, typically at least 4-6 weeks. ° °HOME CARE INSTRUCTIONS  °Remove items at home which could result in a fall. This includes throw rugs or furniture in walking pathways.  °Continue medications as instructed at time of discharge. °You may have some home medications which will be placed on hold until you complete the course of blood thinner medication.  °You may start showering once you are discharged home but do not submerge the incision under water. Just pat the incision dry and apply a dry gauze dressing on daily. °Walk with walker as instructed.  °You may resume a sexual relationship in one month or when given the OK by your doctor.  °· Use walker as long as suggested by your caregivers. °· Avoid periods of inactivity such as sitting longer than an hour when not asleep. This helps prevent blood clots.  °You may put full weight on your legs and walk as much as is comfortable.  °You may return to work once you are cleared by your doctor.  °Do not drive a car for 6 weeks or until released by you surgeon.  °· Do not drive  while taking narcotics.  °Wear the elastic stockings for three weeks following surgery during the day but you may remove then at night. °Make sure you keep all of your appointments after your operation with all of your doctors and caregivers. You should call the office at the above phone number and make an appointment for approximately two weeks after the date of your surgery. °Do not remove your surgical dressing. The dressing is waterproof; you may take showers in 3 days, but do not take tub baths or submerge the dressing. °Please pick up a stool softener and laxative for home use as long as you are requiring pain medications. °· ICE to the affected knee every three hours for 30 minutes at a time and then as needed for pain and swelling.  Continue to use ice on the knee for pain and swelling from surgery. You may notice swelling that will progress down to the foot and ankle.  This is normal after surgery.  Elevate the leg when you are not up walking on it.   °It is important for you to complete the blood thinner medication as prescribed by your doctor. °· Continue to use the breathing machine which will help keep your temperature down.  It is common for your temperature to cycle up and down following surgery, especially at night when you are not up moving around and exerting yourself.  The breathing machine keeps your lungs expanded and your temperature down. ° °RANGE OF MOTION AND STRENGTHENING EXERCISES  °Rehabilitation of the knee is important following   a knee injury or an operation. After just a few days of immobilization, the muscles of the thigh which control the knee become weakened and shrink (atrophy). Knee exercises are designed to build up the tone and strength of the thigh muscles and to improve knee motion. Often times heat used for twenty to thirty minutes before working out will loosen up your tissues and help with improving the range of motion but do not use heat for the first two weeks following  surgery. These exercises can be done on a training (exercise) mat, on the floor, on a table or on a bed. Use what ever works the best and is most comfortable for you Knee exercises include:  °Leg Lifts - While your knee is still immobilized in a splint or cast, you can do straight leg raises. Lift the leg to 60 degrees, hold for 3 sec, and slowly lower the leg. Repeat 10-20 times 2-3 times daily. Perform this exercise against resistance later as your knee gets better.  °Quad and Hamstring Sets - Tighten up the muscle on the front of the thigh (Quad) and hold for 5-10 sec. Repeat this 10-20 times hourly. Hamstring sets are done by pushing the foot backward against an object and holding for 5-10 sec. Repeat as with quad sets.  °A rehabilitation program following serious knee injuries can speed recovery and prevent re-injury in the future due to weakened muscles. Contact your doctor or a physical therapist for more information on knee rehabilitation.  ° °SKILLED REHAB INSTRUCTIONS: °If the patient is transferred to a skilled rehab facility following release from the hospital, a list of the current medications will be sent to the facility for the patient to continue.  When discharged from the skilled rehab facility, please have the facility set up the patient's Home Health Physical Therapy prior to being released. Also, the skilled facility will be responsible for providing the patient with their medications at time of release from the facility to include their pain medication, the muscle relaxants, and their blood thinner medication. If the patient is still at the rehab facility at time of the two week follow up appointment, the skilled rehab facility will also need to assist the patient in arranging follow up appointment in our office and any transportation needs. ° °MAKE SURE YOU:  °Understand these instructions.  °Will watch your condition.  °Will get help right away if you are not doing well or get worse.   ° ° °Pick up stool softner and laxative for home use following surgery while on pain medications. °Do NOT remove your dressing. You may shower.  °Do not take tub baths or submerge incision under water. °May shower starting three days after surgery. °Please use a clean towel to pat the incision dry following showers. °Continue to use ice for pain and swelling after surgery. °Do not use any lotions or creams on the incision until instructed by your surgeon. ° °

## 2017-08-08 NOTE — H&P (View-Only) (Signed)
TOTAL KNEE ADMISSION H&P  Patient is being admitted for left total knee arthroplasty.  Subjective:  Chief Complaint:left knee pain.  HPI: Todd Robertson, 71 y.o. male, has a history of pain and functional disability in the left knee due to arthritis and has failed non-surgical conservative treatments for greater than 12 weeks to includeNSAID's and/or analgesics, corticosteriod injections, flexibility and strengthening excercises, supervised PT with diminished ADL's post treatment, use of assistive devices, weight reduction as appropriate and activity modification.  Onset of symptoms was gradual, starting 3 years ago with gradually worsening course since that time. The patient noted no past surgery on the left knee(s).  Patient currently rates pain in the left knee(s) at 10 out of 10 with activity. Patient has night pain, worsening of pain with activity and weight bearing, pain that interferes with activities of daily living, pain with passive range of motion, crepitus and joint swelling.  Patient has evidence of subchondral cysts, subchondral sclerosis, periarticular osteophytes and joint space narrowing by imaging studies. There is no active infection.  Patient Active Problem List   Diagnosis Date Noted  . DM 12/26/2009  . OVERWEIGHT/OBESITY 08/08/2009  . HTN (hypertension) 08/08/2009  . CARDIOMYOPATHY, SECONDARY 08/08/2009   Past Medical History:  Diagnosis Date  . Cardiomyopathy    secondary  . DM2 (diabetes mellitus, type 2) ([place])   . HTN (hypertension)    unspec  . Nonischemic cardiomyopathy ([place])    a. EF 40-50% by most recent 2-D echo b. EF 25% by cardiac cath in 2004  . Overweight(278.02)     No past surgical history on file.   (Not in a hospital admission) No Known Allergies  Social History  Substance Use Topics  . Smoking status: Former Smoker    Packs/day: 0.50    Years: 20.00    Types: Cigarettes    Start date: 02/12/1966    Quit date: 11/08/1984  . Smokeless  tobacco: Never Used  . Alcohol use No    No family history on file.   Review of Systems  Constitutional: Negative.   HENT: Negative.   Eyes: Negative.   Respiratory: Positive for cough and wheezing. Negative for hemoptysis, sputum production and shortness of breath.   Cardiovascular: Negative.   Gastrointestinal: Negative.   Genitourinary: Negative.   Musculoskeletal: Positive for joint pain.  Skin: Negative.   Neurological: Negative.   Endo/Heme/Allergies: Negative.   Psychiatric/Behavioral: Negative.     Objective:  Physical Exam  Vitals reviewed. Constitutional: He is oriented to person, place, and time. He appears well-developed and well-nourished.  HENT:  Head: Normocephalic and atraumatic.  Eyes: Pupils are equal, round, and reactive to light. Conjunctivae and EOM are normal.  Neck: Normal range of motion. Neck supple.  Cardiovascular: Normal rate, regular rhythm and intact distal pulses.   Respiratory: Effort normal. No respiratory distress.  GI: Soft. He exhibits no distension.  Genitourinary:  Genitourinary Comments: deferred  Musculoskeletal:       Left knee: He exhibits decreased range of motion, swelling, effusion and abnormal alignment. Tenderness found. Medial joint line and lateral joint line tenderness noted.  Neurological: He is alert and oriented to person, place, and time. He has normal reflexes.  Skin: Skin is warm.  Psychiatric: He has a normal mood and affect. His behavior is normal. Judgment and thought content normal.    Vital signs in last 24 hours: @VSRANGES @  Labs:   Estimated body mass index is 37.7 kg/m as calculated from the following:   Height as  of 06/30/17: 6' (1.829 m).   Weight as of 06/30/17: 126.1 kg (278 lb).   Imaging Review Plain radiographs demonstrate severe degenerative joint disease of the left knee(s). The overall alignment issignificant varus. The bone quality appears to be adequate for age and reported activity  level.  Assessment/Plan:  End stage arthritis, left knee   The patient history, physical examination, clinical judgment of the provider and imaging studies are consistent with end stage degenerative joint disease of the left knee(s) and total knee arthroplasty is deemed medically necessary. The treatment options including medical management, injection therapy arthroscopy and arthroplasty were discussed at length. The risks and benefits of total knee arthroplasty were presented and reviewed. The risks due to aseptic loosening, infection, stiffness, patella tracking problems, thromboembolic complications and other imponderables were discussed. The patient acknowledged the explanation, agreed to proceed with the plan and consent was signed. Patient is being admitted for inpatient treatment for surgery, pain control, PT, OT, prophylactic antibiotics, VTE prophylaxis, progressive ambulation and ADL's and discharge planning. The patient is planning to be discharged home with outpatient PT @ [facility]

## 2017-08-08 NOTE — Anesthesia Procedure Notes (Addendum)
Anesthesia Regional Block: Adductor canal block   Pre-Anesthetic Checklist: ,, timeout performed, Correct Patient, Correct Site, Correct Laterality, Correct Procedure, Correct Position, site marked, Risks and benefits discussed,  Surgical consent,  Pre-op evaluation,  At surgeon's request and post-op pain management  Laterality: Left and Lower  Prep: chloraprep       Needles:  Injection technique: Single-shot  Needle Type: Echogenic Stimulator Needle     Needle Length: 9cm  Needle Gauge: 21   Needle insertion depth: 4 cm   Additional Needles:   Procedures:,,,, ultrasound used (permanent image in chart),,,,  Narrative:

## 2017-08-09 ENCOUNTER — Encounter (HOSPITAL_COMMUNITY): Payer: Self-pay | Admitting: Orthopedic Surgery

## 2017-08-09 ENCOUNTER — Inpatient Hospital Stay (HOSPITAL_COMMUNITY): Payer: Medicare Other

## 2017-08-09 DIAGNOSIS — Z96652 Presence of left artificial knee joint: Secondary | ICD-10-CM

## 2017-08-09 DIAGNOSIS — I340 Nonrheumatic mitral (valve) insufficiency: Secondary | ICD-10-CM

## 2017-08-09 LAB — COMPREHENSIVE METABOLIC PANEL
ALBUMIN: 3.1 g/dL — AB (ref 3.5–5.0)
ALT: 10 U/L — ABNORMAL LOW (ref 17–63)
ANION GAP: 5 (ref 5–15)
AST: 14 U/L — AB (ref 15–41)
Alkaline Phosphatase: 57 U/L (ref 38–126)
BUN: 27 mg/dL — AB (ref 6–20)
CHLORIDE: 107 mmol/L (ref 101–111)
CO2: 26 mmol/L (ref 22–32)
Calcium: 8.5 mg/dL — ABNORMAL LOW (ref 8.9–10.3)
Creatinine, Ser: 1.79 mg/dL — ABNORMAL HIGH (ref 0.61–1.24)
GFR calc Af Amer: 43 mL/min — ABNORMAL LOW (ref >60)
GFR calc non Af Amer: 37 mL/min — ABNORMAL LOW (ref >60)
GLUCOSE: 127 mg/dL — AB (ref 65–99)
POTASSIUM: 4.3 mmol/L (ref 3.5–5.1)
SODIUM: 138 mmol/L (ref 135–145)
Total Bilirubin: 0.8 mg/dL (ref 0.3–1.2)
Total Protein: 5.7 g/dL — ABNORMAL LOW (ref 6.5–8.1)

## 2017-08-09 LAB — CBC
HEMATOCRIT: 32.2 % — AB (ref 39.0–52.0)
HEMOGLOBIN: 10.1 g/dL — AB (ref 13.0–17.0)
MCH: 28.8 pg (ref 26.0–34.0)
MCHC: 31.4 g/dL (ref 30.0–36.0)
MCV: 91.7 fL (ref 78.0–100.0)
Platelets: 175 10*3/uL (ref 150–400)
RBC: 3.51 MIL/uL — ABNORMAL LOW (ref 4.22–5.81)
RDW: 14.3 % (ref 11.5–15.5)
WBC: 9.2 10*3/uL (ref 4.0–10.5)

## 2017-08-09 LAB — ECHOCARDIOGRAM COMPLETE
AOASC: 38 cm
E decel time: 222 msec
FS: 28 % (ref 28–44)
HEIGHTINCHES: 74 in
IV/PV OW: 1.36
LA diam index: 1.82 cm/m2
LA vol index: 22.4 mL/m2
LA vol: 57.8 mL
LASIZE: 47 mm
LAVOLA4C: 49.3 mL
LDCA: 3.46 cm2
LEFT ATRIUM END SYS DIAM: 47 mm
LV PW d: 11 mm — AB (ref 0.6–1.1)
LV TDI E'MEDIAL: 4.57
LV e' LATERAL: 6.53 cm/s
LVOT diameter: 21 mm
MV Dec: 222
MV pk E vel: 0.8 m/s
RV LATERAL S' VELOCITY: 16.3 cm/s
TAPSE: 21.4 mm
TDI e' lateral: 6.53
Weight: 4368 oz

## 2017-08-09 LAB — GLUCOSE, CAPILLARY
GLUCOSE-CAPILLARY: 171 mg/dL — AB (ref 65–99)
GLUCOSE-CAPILLARY: 88 mg/dL (ref 65–99)
Glucose-Capillary: 134 mg/dL — ABNORMAL HIGH (ref 65–99)
Glucose-Capillary: 206 mg/dL — ABNORMAL HIGH (ref 65–99)
Glucose-Capillary: 158 mg/dL — ABNORMAL HIGH (ref 65–99)

## 2017-08-09 NOTE — Progress Notes (Addendum)
Physical Therapy Treatment Patient Details Name: Todd Robertson MRN: [id number] DOB: [birth year] Today's Date: 08/09/2017    History of Present Illness 71 y.o. male admitted for left TKA with post op NSVT. PMHx: NICM, CKD, DM, obesity and prostate CA     PT Comments    Pt continues to move well and perform HEP. Pt educated for positioning and progression with increased tolerance for all mobility.    Follow Up Recommendations  Home health PT     Equipment Recommendations  Rolling walker with 5" wheels    Recommendations for Other Services       Precautions / Restrictions Precautions Precautions: Knee Restrictions Weight Bearing Restrictions: Yes LLE Weight Bearing: Weight bearing as tolerated    Mobility  Bed Mobility Overal bed mobility: Modified Independent                Transfers Overall transfer level: Modified independent                  Ambulation/Gait Ambulation/Gait assistance: Supervision Ambulation Distance (Feet): 200 Feet Assistive device: Rolling walker (2 wheeled) Gait Pattern/deviations: Step-through pattern;Trunk flexed   Gait velocity interpretation: Below normal speed for age/gender General Gait Details: cues for posture and position in RW with pt able to maintain step-through pattern after initial cues with good heel strike   Stairs            Wheelchair Mobility    Modified Rankin (Stroke Patients Only)       Balance                                            Cognition Arousal/Alertness: Awake/alert Behavior During Therapy: WFL for tasks assessed/performed Overall Cognitive Status: Within Functional Limits for tasks assessed                                        Exercises Total Joint Exercises Heel Slides: AAROM;Left;Supine;15 reps Hip ABduction/ADduction: AROM;Left;Supine;15 reps Straight Leg Raises: AROM;15 reps;Supine;Left Long [employer]: AROM;15  reps;Left;Seated Goniometric ROM: 0-80    General Comments        Pertinent Vitals/Pain Pain Assessment: No/denies pain    Home Living                      Prior Function            PT Goals (current goals can now be found in the care plan section) Progress towards PT goals: Progressing toward goals    Frequency    7X/week      PT Plan Current plan remains appropriate    Co-evaluation              AM-PAC PT "6 Clicks" Daily Activity  Outcome Measure  Difficulty turning over in bed (including adjusting bedclothes, sheets and blankets)?: None Difficulty moving from lying on back to sitting on the side of the bed? : A Little Difficulty sitting down on and standing up from a chair with arms (e.g., wheelchair, bedside commode, etc,.)?: A Little Help needed moving to and from a bed to chair (including a wheelchair)?: None Help needed walking in hospital room?: A Little Help needed climbing 3-5 steps with a railing? : A Little 6 Click Score: 20    End of Session  Equipment Utilized During Treatment: Gait belt Activity Tolerance: Patient tolerated treatment well Patient left: in chair;with call bell/phone within reach Nurse Communication: Mobility status;Weight bearing status PT Visit Diagnosis: Difficulty in walking, not elsewhere classified (R26.2)     Time: 1242-1300 PT Time Calculation (min) (ACUTE ONLY): 18 min  Charges:  $Gait Training: 8-22 mins                    G Codes:       Elwyn Reach, PT [phone]    [place] 08/09/2017, 1:37 PM

## 2017-08-09 NOTE — Progress Notes (Signed)
   Subjective:  Patient reports pain as mild to moderate.  Denies N/V/CP/SOB. Cardiology saw patient yesterday for NSTV.  Objective:   VITALS:   Vitals:   08/09/17 0413 08/09/17 0758 08/09/17 0807 08/09/17 0814  BP: 112/73   132/69  Pulse: 74 72  72  Resp: 15   16  Temp: 98.9 F (37.2 C)   98.3 F (36.8 C)  TempSrc: Oral   Oral  SpO2: 98% 98% 96% 94%  Weight:      Height:        NAD ABD soft Sensation intact distally Intact pulses distally Dorsiflexion/Plantar flexion intact Incision: dressing C/D/I Compartment soft   Lab Results  Component Value Date   WBC 9.2 08/09/2017   HGB 10.1 (L) 08/09/2017   HCT 32.2 (L) 08/09/2017   MCV 91.7 08/09/2017   PLT 175 08/09/2017   BMET    Component Value Date/Time   NA 138 08/09/2017 0429   K 4.3 08/09/2017 0429   CL 107 08/09/2017 0429   CO2 26 08/09/2017 0429   GLUCOSE 127 (H) 08/09/2017 0429   BUN 27 (H) 08/09/2017 0429   CREATININE 1.79 (H) 08/09/2017 0429   CALCIUM 8.5 (L) 08/09/2017 0429   GFRNONAA 37 (L) 08/09/2017 0429   GFRAA 43 (L) 08/09/2017 0429     Assessment/Plan: 1 Day Post-Op   Principal Problem:   Osteoarthritis of left knee Active Problems:   CKD (chronic kidney disease) stage 3, GFR 30-59 ml/min (HCC)   Degenerative arthritis of left knee   NSVT (nonsustained ventricular tachycardia) (HCC)   WBAT with walker DVT ppx: ASA, SCDs, TEDs PO pain control DM2: glucose < 200 CKD3: stable ABLA: asymptomatic, monitor NSVT: cardiology following, echo pending Dispo: D/C home tomorrow, outpatient PT set up, has DME already   Elie Goody 08/09/2017, 9:01 AM   Rod Can, MD Cell [phone]

## 2017-08-09 NOTE — Evaluation (Signed)
Physical Therapy Evaluation Patient Details Name: Todd Robertson MRN: [id number] DOB: 1946/02/20 Today's Date: 08/09/2017   History of Present Illness  71 y.o. male admitted for left TKA with post op NSVT. PMHx: NICM, CKD, DM, obesity and prostate CA   Clinical Impression  Pt moving well and reports general understanding of expectations due to wife having bil TKA. Pt with decreased strength, ROM, transfers and gait who will benefit from acute therapy to maximize mobility, ROM and independence to decrease burden of care.   HR 72 SpO2 97% RA    Follow Up Recommendations Home health PT    Equipment Recommendations  None recommended by PT    Recommendations for Other Services       Precautions / Restrictions Precautions Precautions: Knee Restrictions LLE Weight Bearing: Weight bearing as tolerated      Mobility  Bed Mobility Overal bed mobility: Modified Independent                Transfers Overall transfer level: Needs assistance   Transfers: Sit to/from Stand Sit to Stand: Supervision         General transfer comment: cues for hand placement and safety. Pt able to leave bil feet planted to sit  Ambulation/Gait Ambulation/Gait assistance: Min guard Ambulation Distance (Feet): 150 Feet Assistive device: Rolling walker (2 wheeled) Gait Pattern/deviations: Step-through pattern;Decreased stride length;Trunk flexed   Gait velocity interpretation: Below normal speed for age/gender General Gait Details: cues for posture, position in RW, step-through pattern and heel strike LLE  Stairs            Wheelchair Mobility    Modified Rankin (Stroke Patients Only)       Balance Overall balance assessment: No apparent balance deficits (not formally assessed)                                           Pertinent Vitals/Pain Pain Assessment: 0-10 Pain Score: 5  Pain Location: left knee Pain Descriptors / Indicators: Aching Pain  Intervention(s): Limited activity within patient's tolerance;Repositioned;Premedicated before session    Home Living Family/patient expects to be discharged to:: Private residence Living Arrangements: Spouse/significant other Available Help at Discharge: Family;Available 24 hours/day Type of Home: House Home Access: Level entry     Home Layout: One level Home Equipment: Walker - 2 wheels;Bedside commode      Prior Function Level of Independence: Independent               Hand Dominance        Extremity/Trunk Assessment   Upper Extremity Assessment Upper Extremity Assessment: Overall WFL for tasks assessed    Lower Extremity Assessment Lower Extremity Assessment: LLE deficits/detail LLE Deficits / Details: decreased ROM and strength as expected post op    Cervical / Trunk Assessment Cervical / Trunk Assessment: Normal  Communication   Communication: No difficulties  Cognition Arousal/Alertness: Awake/alert Behavior During Therapy: WFL for tasks assessed/performed Overall Cognitive Status: Within Functional Limits for tasks assessed                                        General Comments      Exercises Total Joint Exercises Heel Slides: AAROM;Left;Supine;10 reps Straight Leg Raises: AROM;Left;Supine;10 reps Long Arc Quad: AROM;Left;Seated;10 reps   Assessment/Plan    PT Assessment  Patient needs continued PT services  PT Problem List Decreased strength;Decreased mobility;Decreased range of motion;Decreased activity tolerance;Decreased knowledge of use of DME;Pain       PT Treatment Interventions DME instruction;Therapeutic activities;Gait training;Therapeutic exercise;Patient/family education;Functional mobility training    PT Goals (Current goals can be found in the Care Plan section)  Acute Rehab PT Goals Patient Stated Goal: return home and mow the yard PT Goal Formulation: With patient Time For Goal Achievement:  08/16/17 Potential to Achieve Goals: Good    Frequency 7X/week   Barriers to discharge        Co-evaluation               AM-PAC PT "6 Clicks" Daily Activity  Outcome Measure Difficulty turning over in bed (including adjusting bedclothes, sheets and blankets)?: None Difficulty moving from lying on back to sitting on the side of the bed? : A Little Difficulty sitting down on and standing up from a chair with arms (e.g., wheelchair, bedside commode, etc,.)?: A Little Help needed moving to and from a bed to chair (including a wheelchair)?: A Little Help needed walking in hospital room?: A Little Help needed climbing 3-5 steps with a railing? : A Little 6 Click Score: 19    End of Session Equipment Utilized During Treatment: Gait belt Activity Tolerance: Patient tolerated treatment well Patient left: in chair;with call bell/phone within reach Nurse Communication: Mobility status;Weight bearing status PT Visit Diagnosis: Difficulty in walking, not elsewhere classified (R26.2);Pain Pain - Right/Left: Left Pain - part of body: Knee    Time: 0725-0752 PT Time Calculation (min) (ACUTE ONLY): 27 min   Charges:   PT Evaluation $PT Eval Moderate Complexity: 1 Mod PT Treatments $Gait Training: 8-22 mins   PT G Codes:        Elwyn Reach, PT [phone]   [place] B [name] 08/09/2017, 8:01 AM

## 2017-08-09 NOTE — Progress Notes (Signed)
Inpatient Diabetes Program Recommendations  AACE/ADA: New Consensus Statement on Inpatient Glycemic Control (2015)  Target Ranges:  Prepandial:   less than 140 mg/dL      Peak postprandial:   less than 180 mg/dL (1-2 hours)      Critically ill patients:  140 - 180 mg/dL   Lab Results  Component Value Date   GLUCAP 134 (H) 08/09/2017   HGBA1C 5.9 (H) 07/29/2017     Inpatient Diabetes Program Recommendations:  Spoke to Dr. Lyla Glassing regarding Metformin - Metformin had been stopped last week by MD when labs were abnormal at pre-admission.  Creatinine 1.79mg /dl today.  Verbal order to d/c Metformin received with read back from Dr. Lyla Glassing.  Gentry Fitz, RN, BA, MHA, CDE Diabetes Coordinator Inpatient Diabetes Program  [phone] (Team Pager) [phone] ([place]) 08/09/2017 10:58 AM

## 2017-08-09 NOTE — Care Management (Signed)
Per MD notes patient will be going to outpatient therapy, appointment has been scheduled. Case Manager has requested RW. He will have family support at discharge.

## 2017-08-09 NOTE — Progress Notes (Signed)
  Echocardiogram 2D Echocardiogram has been performed.  Mehul Rudin G Miriah Maruyama 08/09/2017, 4:45 PM

## 2017-08-09 NOTE — Progress Notes (Addendum)
Inpatient Diabetes Program Recommendations  AACE/ADA: New Consensus Statement on Inpatient Glycemic Control (2015)  Target Ranges:  Prepandial:   less than 140 mg/dL      Peak postprandial:   less than 180 mg/dL (1-2 hours)      Critically ill patients:  140 - 180 mg/dL   Lab Results  Component Value Date   GLUCAP 134 (H) 08/09/2017   HGBA1C 5.9 (H) 07/29/2017    Review of Glycemic Control Results for WHITT, AULETTA (MRN [id number]) as of 08/09/2017 08:08  Ref. Range 08/08/2017 11:59 08/08/2017 17:13 08/09/2017 00:35 08/09/2017 06:43  Glucose-Capillary Latest Ref Range: 65 - 99 mg/dL 99 82 88 134 (H)     Diabetes history: Type 2 Outpatient Diabetes medications: Glucophage 500mg  qam cancelled on August 05, 2017 started Fiasp 1unit for CBG 150-200mg /dl, 3 units for CBG 201-250mg /dl, 5 units 251-300mg /dl,  7 units 301-350mg , 351-400mg /dl 9 units, and 12 units for CBG >400mg /dl.   Current orders for Inpatient glycemic control: Glucophage 500mg  qam, Novolog 0-15 units tid  Inpatient Diabetes Program Recommendations: Consider decreasing Novolog correction to 0-9 units tid  Consider d/c Metformin (MD stopped it last week because of kidney function)  Spoke to patient at the bedside- had pre-op blood work last week and MD stopped the Metformin because of his kidney function.  Was started of Fiasp insulin (Novolog plus Vitamin B3) - he was told to take it twice a day and so he was taking it in the morning and at bedtime.  (He didn't actually take it at bedtime because his blood sugar had been under 150mg /dl ).  Because this is a mealtime insulin, he has been instructed to take the drug pre- breakfast and supper (his 2 largest meals).    Gentry Fitz, RN, BA, MHA, CDE Diabetes Coordinator Inpatient Diabetes Program  [phone] (Team Pager) [phone] ([place]) 08/09/2017 8:37 AM

## 2017-08-09 NOTE — Evaluation (Signed)
Occupational Therapy Evaluation Patient Details Name: Todd Robertson MRN: [id number] DOB: 27-Jun-1946 Today's Date: 08/09/2017    History of Present Illness 71 y.o. male admitted for left TKA with post op NSVT. PMHx: NICM, CKD, DM, obesity and prostate CA    Clinical Impression   This 71 y/o M presents with the above. At baseline Pt is independent with ADLs and functional mobility. Pt completed room and hallway functional mobility at [place] level this session with MinGuard assist, MinA for tub transfer. Pt requires increased assist ([place]) to complete LB ADLs secondary to LLE functional deficits. Pt will return home with 24 hr assist from spouse who is able to provide ADL assist PRN. Education provided and questions answered throughout. Pt reports feeling comfortable completing ADLs after return home and with spouse assist/supervision PRN. No further acute OT needs identified at this time. Will sign off.     Follow Up Recommendations  DC plan and follow up therapy as arranged by surgeon;Supervision/Assistance - 24 hour    Equipment Recommendations  None recommended by OT           Precautions / Restrictions Precautions Precautions: Knee Precaution Comments: verbally reviewed precautions  Restrictions Weight Bearing Restrictions: Yes LLE Weight Bearing: Weight bearing as tolerated      Mobility Bed Mobility Overal bed mobility: Modified Independent                Transfers Overall transfer level: Needs assistance Equipment used: Rolling walker (2 wheeled) Transfers: Sit to/from Stand Sit to Stand: Supervision         General transfer comment: cues for hand placement and safety; supervision for safety     Balance Overall balance assessment: No apparent balance deficits (not formally assessed)                                         ADL either performed or assessed with clinical judgement   ADL Overall ADL's : Needs  assistance/impaired Eating/Feeding: Set up;Sitting   Grooming: Min guard;Standing   Upper Body Bathing: Min guard;Sitting   Lower Body Bathing: Minimal assistance;Sit to/from stand   Upper Body Dressing : Set up;Sitting   Lower Body Dressing: Moderate assistance;Sit to/from stand Lower Body Dressing Details (indicate cue type and reason): educated on compensatory techniques for completing task  Toilet Transfer: Min guard;Ambulation;BSC;RW Toilet Transfer Details (indicate cue type and reason): BSC over toilet; minguard while Pt stands to void bladder  Toileting- Clothing Manipulation and Hygiene: Min guard;Sit to/from stand   Tub/ Shower Transfer: Tub transfer;Minimal assistance;Ambulation;3 in 1;Rolling walker;Cueing for sequencing Tub/Shower Transfer Details (indicate cue type and reason): educated Pt on having supervision during tub transfers at home (initially) for increased safety with Pt/Pt spouse verbalizing understanding  Functional mobility during ADLs: Min guard;Rolling walker General ADL Comments: Pt completed room and hallway level functional mobility with MinGuard, mInA for tub transfer, spouse present and educating Pt and spouse on transfers, compensatory techniques for completing ADLs and functional mobility                          Pertinent Vitals/Pain Pain Assessment: Faces Faces Pain Scale: Hurts little more Pain Location: left knee Pain Descriptors / Indicators: Aching Pain Intervention(s): Monitored during session;Ice applied     Hand Dominance     Extremity/Trunk Assessment Upper Extremity Assessment Upper Extremity Assessment: Overall WFL for tasks  assessed   Lower Extremity Assessment Lower Extremity Assessment: Defer to PT evaluation   Cervical / Trunk Assessment Cervical / Trunk Assessment: Normal   Communication Communication Communication: No difficulties   Cognition Arousal/Alertness: Awake/alert Behavior During Therapy: WFL for  tasks assessed/performed Overall Cognitive Status: Within Functional Limits for tasks assessed                                                     Home Living Family/patient expects to be discharged to:: Private residence Living Arrangements: Spouse/significant other Available Help at Discharge: Family;Available 24 hours/day Type of Home: House Home Access: Level entry     Home Layout: One level     Bathroom Shower/Tub: Teacher, early years/pre: Handicapped height     Home Equipment: Environmental consultant - 2 wheels;Bedside commode          Prior Functioning/Environment Level of Independence: Independent                 OT Problem List: Decreased strength;Decreased activity tolerance;Decreased knowledge of use of DME or AE;Decreased knowledge of precautions;Decreased range of motion            OT Goals(Current goals can be found in the care plan section) Acute Rehab OT Goals Patient Stated Goal: return home and mow the yard OT Goal Formulation: With patient                                 AM-PAC PT "6 Clicks" Daily Activity     Outcome Measure Help from another person eating meals?: None Help from another person taking care of personal grooming?: A Little Help from another person toileting, which includes using toliet, bedpan, or urinal?: A Little Help from another person bathing (including washing, rinsing, drying)?: A Little Help from another person to put on and taking off regular upper body clothing?: None Help from another person to put on and taking off regular lower body clothing?: A Lot 6 Click Score: 19   End of Session Equipment Utilized During Treatment: Gait belt;Rolling walker Nurse Communication: Mobility status  Activity Tolerance: Patient tolerated treatment well Patient left: in bed;with call bell/phone within reach;with family/visitor present  OT Visit Diagnosis: Other abnormalities of gait and mobility  (R26.89)                Time: 6283-1517 OT Time Calculation (min): 37 min Charges:  OT General Charges $OT Visit: 1 Visit OT Evaluation $OT Eval Low Complexity: 1 Low OT Treatments $Self Care/Home Management : 8-22 mins G-Codes:     Lou Cal, OT Pager [phone] 08/09/2017   Raymondo Band 08/09/2017, 4:25 PM

## 2017-08-09 NOTE — Progress Notes (Signed)
DAILY PROGRESS NOTE   Patient Name: Todd Robertson Date of Encounter: 08/09/2017  Hospital Problem List   Principal Problem:   Osteoarthritis of left knee Active Problems:   CKD (chronic kidney disease) stage 3, GFR 30-59 ml/min (HCC)   Degenerative arthritis of left knee   NSVT (nonsustained ventricular tachycardia) ([place])    Chief Complaint   Mild knee pain  Subjective   No events overnight. Denies chest pain. PVC's, but no NSVT noted - monitor shows artifact.  Objective   Vitals:   08/09/17 0413 08/09/17 0758 08/09/17 0807 08/09/17 0814  BP: 112/73   132/69  Pulse: 74 72  72  Resp: 15   16  Temp: 98.9 F (37.2 C)   98.3 F (36.8 C)  TempSrc: Oral   Oral  SpO2: 98% 98% 96% 94%  Weight:      Height:        Intake/Output Summary (Last 24 hours) at 08/09/17 0956 Last data filed at 08/09/17 0815  Gross per 24 hour  Intake             1890 ml  Output             1200 ml  Net              690 ml   Filed Weights   08/08/17 1154  Weight: 273 lb (123.8 kg)    Physical Exam   General appearance: alert and no distress Neck: no carotid bruit, no JVD and thyroid not enlarged, symmetric, no tenderness/mass/nodules Lungs: clear to auscultation bilaterally Heart: regular rate and rhythm Abdomen: soft, non-tender; bowel sounds normal; no masses,  no organomegaly Extremities: left knee s./p surgery Pulses: 2+ and symmetric Skin: Skin color, texture, turgor normal. No rashes or lesions Neurologic: Grossly normal Psych: Pleasant  Inpatient Medications    Scheduled Meds: . allopurinol  300 mg Oral BH-q7a  . aspirin  81 mg Oral BID  . atorvastatin  40 mg Oral QPM  . benazepril  40 mg Oral BH-q7a  . bumetanide  2 mg Oral BH-q7a  . carvedilol  25 mg Oral BID  . docusate sodium  100 mg Oral BID  . fluticasone furoate-vilanterol  1 puff Inhalation Daily  . insulin aspart  0-15 Units Subcutaneous TID WC  . metFORMIN  500 mg Oral Q breakfast  . senna  2 tablet  Oral QHS    Continuous Infusions: . sodium chloride    . methocarbamol (ROBAXIN)  IV      PRN Meds: acetaminophen **OR** acetaminophen, alum & mag hydroxide-simeth, diphenhydrAMINE, HYDROcodone-acetaminophen, HYDROmorphone (DILAUDID) injection, menthol-cetylpyridinium **OR** phenol, methocarbamol **OR** methocarbamol (ROBAXIN)  IV, metoCLOPramide **OR** metoCLOPramide (REGLAN) injection, ondansetron **OR** ondansetron (ZOFRAN) IV   Labs   Results for orders placed or performed during the hospital encounter of 08/08/17 (from the past 48 hour(s))  Glucose, capillary     Status: None   Collection Time: 08/08/17 11:59 AM  Result Value Ref Range   Glucose-Capillary 99 65 - 99 mg/dL  I-STAT 4, (NA,K, GLUC, HGB,HCT)     Status: Abnormal   Collection Time: 08/08/17  4:13 PM  Result Value Ref Range   Sodium 147 (H) 135 - 145 mmol/L   Potassium 3.9 3.5 - 5.1 mmol/L   Glucose, Bld 89 65 - 99 mg/dL   HCT 32.0 (L) 39.0 - 52.0 %   Hemoglobin 10.9 (L) 13.0 - 17.0 g/dL  Glucose, capillary     Status: None   Collection Time: 08/08/17  5:13 PM  Result Value Ref Range   Glucose-Capillary 82 65 - 99 mg/dL   Comment 1 Notify RN   Magnesium     Status: None   Collection Time: 08/08/17  9:10 PM  Result Value Ref Range   Magnesium 1.7 1.7 - 2.4 mg/dL  Glucose, capillary     Status: None   Collection Time: 08/09/17 12:35 AM  Result Value Ref Range   Glucose-Capillary 88 65 - 99 mg/dL  Comprehensive metabolic panel     Status: Abnormal   Collection Time: 08/09/17  4:29 AM  Result Value Ref Range   Sodium 138 135 - 145 mmol/L    Comment: DELTA CHECK NOTED   Potassium 4.3 3.5 - 5.1 mmol/L   Chloride 107 101 - 111 mmol/L   CO2 26 22 - 32 mmol/L   Glucose, Bld 127 (H) 65 - 99 mg/dL   BUN 27 (H) 6 - 20 mg/dL   Creatinine, Ser 1.79 (H) 0.61 - 1.24 mg/dL   Calcium 8.5 (L) 8.9 - 10.3 mg/dL   Total Protein 5.7 (L) 6.5 - 8.1 g/dL   Albumin 3.1 (L) 3.5 - 5.0 g/dL   AST 14 (L) 15 - 41 U/L   ALT 10  (L) 17 - 63 U/L   Alkaline Phosphatase 57 38 - 126 U/L   Total Bilirubin 0.8 0.3 - 1.2 mg/dL   GFR calc non Af Amer 37 (L) >60 mL/min   GFR calc Af Amer 43 (L) >60 mL/min    Comment: (NOTE) The eGFR has been calculated using the CKD EPI equation. This calculation has not been validated in all clinical situations. eGFR's persistently <60 mL/min signify possible Chronic Kidney Disease.    Anion gap 5 5 - 15  CBC     Status: Abnormal   Collection Time: 08/09/17  4:29 AM  Result Value Ref Range   WBC 9.2 4.0 - 10.5 K/uL   RBC 3.51 (L) 4.22 - 5.81 MIL/uL   Hemoglobin 10.1 (L) 13.0 - 17.0 g/dL   HCT 32.2 (L) 39.0 - 52.0 %   MCV 91.7 78.0 - 100.0 fL   MCH 28.8 26.0 - 34.0 pg   MCHC 31.4 30.0 - 36.0 g/dL   RDW 14.3 11.5 - 15.5 %   Platelets 175 150 - 400 K/uL  Glucose, capillary     Status: Abnormal   Collection Time: 08/09/17  6:43 AM  Result Value Ref Range   Glucose-Capillary 134 (H) 65 - 99 mg/dL    ECG   SB at 59, RBBB - Personally Reviewed  Telemetry   Sinus rhythm with PVC's, no NSVT noted overnight - Personally Reviewed  Radiology    Dg Knee Left Port  Result Date: 08/08/2017 CLINICAL DATA:  Postop from left knee arthroplasty. EXAM: PORTABLE LEFT KNEE - 1-2 VIEW COMPARISON:  None. FINDINGS: Total knee arthroplasty with all 3 components in appropriate position. No evidence of fracture or dislocation. Postop soft tissue gas noted. IMPRESSION: Expected postoperative appearance of knee arthroplasty. No complication identified. Electronically Signed   By: Earle Gell M.D.   On: 08/08/2017 18:14    Cardiac Studies   Echo pending  Assessment   1. Principal Problem: 2.   Osteoarthritis of left knee 3. Active Problems: 4.   CKD (chronic kidney disease) stage 3, GFR 30-59 ml/min (HCC) 5.   Degenerative arthritis of left knee 6.   NSVT (nonsustained ventricular tachycardia) (HCC) 7.   Plan   1. Mr. Sheehan is doing well post-op.  No NSVT was noted overnight. HR has  improved. PVC's are noted. Echo is pending today. He is back on home dose carvedilol. Further recommendations pending echo results.  Time Spent Directly with Patient:  I have spent a total of 15 minutes with the patient reviewing hospital notes, telemetry, EKGs, labs and examining the patient as well as establishing an assessment and plan that was discussed personally with the patient. > 50% of time was spent in direct patient care.  Length of Stay:  LOS: 1 day   Pixie Casino, MD, [place]  Attending Cardiologist  Direct Dial: [phone]  Fax: [phone]  Website:  [email or web address] 08/09/2017, 9:56 AM

## 2017-08-10 LAB — CBC
HCT: 27.1 % — ABNORMAL LOW (ref 39.0–52.0)
Hemoglobin: 8.9 g/dL — ABNORMAL LOW (ref 13.0–17.0)
MCH: 29.5 pg (ref 26.0–34.0)
MCHC: 32.8 g/dL (ref 30.0–36.0)
MCV: 89.7 fL (ref 78.0–100.0)
PLATELETS: 153 10*3/uL (ref 150–400)
RBC: 3.02 MIL/uL — AB (ref 4.22–5.81)
RDW: 14.2 % (ref 11.5–15.5)
WBC: 11.9 10*3/uL — ABNORMAL HIGH (ref 4.0–10.5)

## 2017-08-10 LAB — GLUCOSE, CAPILLARY
GLUCOSE-CAPILLARY: 125 mg/dL — AB (ref 65–99)
Glucose-Capillary: 133 mg/dL — ABNORMAL HIGH (ref 65–99)

## 2017-08-10 MED ORDER — HYDROCODONE-ACETAMINOPHEN 5-325 MG PO TABS: 1.0000 | ORAL_TABLET | ORAL | 0 refills | Status: DC | PRN

## 2017-08-10 MED ORDER — ASPIRIN 81 MG PO CHEW: 81.0000 mg | CHEWABLE_TABLET | Freq: Two times a day (BID) | ORAL | 1 refills | Status: DC

## 2017-08-10 MED ORDER — DOCUSATE SODIUM 100 MG PO CAPS: 100.0000 mg | ORAL_CAPSULE | Freq: Two times a day (BID) | ORAL | 1 refills | Status: DC

## 2017-08-10 MED ORDER — SENNA 8.6 MG PO TABS: 2.0000 | ORAL_TABLET | Freq: Every day | ORAL | 0 refills | Status: DC

## 2017-08-10 MED ORDER — ONDANSETRON HCL 4 MG PO TABS: 4.0000 mg | ORAL_TABLET | Freq: Four times a day (QID) | ORAL | 0 refills | Status: DC | PRN

## 2017-08-10 NOTE — Progress Notes (Signed)
Progress Note  Patient Name: Todd Robertson Date of Encounter: 08/10/2017  Primary Cardiologist: Dr. Harl Bowie  Subjective   Patient is feeling well; denies chest pain, SOB, and palpitations. Sinus on telemetry. Patient is ready for discharge  Inpatient Medications    Scheduled Meds: . allopurinol  300 mg Oral BH-q7a  . aspirin  81 mg Oral BID  . atorvastatin  40 mg Oral QPM  . benazepril  40 mg Oral BH-q7a  . bumetanide  2 mg Oral BH-q7a  . carvedilol  25 mg Oral BID  . docusate sodium  100 mg Oral BID  . fluticasone furoate-vilanterol  1 puff Inhalation Daily  . insulin aspart  0-15 Units Subcutaneous TID WC  . senna  2 tablet Oral QHS   Continuous Infusions: . sodium chloride    . methocarbamol (ROBAXIN)  IV     PRN Meds: acetaminophen **OR** acetaminophen, alum & mag hydroxide-simeth, diphenhydrAMINE, HYDROcodone-acetaminophen, HYDROmorphone (DILAUDID) injection, menthol-cetylpyridinium **OR** phenol, methocarbamol **OR** methocarbamol (ROBAXIN)  IV, metoCLOPramide **OR** metoCLOPramide (REGLAN) injection, ondansetron **OR** ondansetron (ZOFRAN) IV   Vital Signs    Vitals:   08/09/17 0814 08/09/17 1238 08/09/17 2044 08/10/17 0411  BP: 132/69 121/83 (!) 124/59 135/74  Pulse: 72 67 85 67  Resp: 16 15 18 20   Temp: 98.3 F (36.8 C) 98.4 F (36.9 C) 98.5 F (36.9 C) 98.4 F (36.9 C)  TempSrc: Oral Oral Oral Oral  SpO2: 94% 99% 97% 98%  Weight:      Height:        Intake/Output Summary (Last 24 hours) at 08/10/17 0945 Last data filed at 08/10/17 2119  Gross per 24 hour  Intake              600 ml  Output              500 ml  Net              100 ml   Filed Weights   08/08/17 1154  Weight: 273 lb (123.8 kg)     Physical Exam   General: Well developed, well nourished, male appearing in no acute distress. Head: Normocephalic, atraumatic.  Neck: Supple without bruits, JVD. Lungs:  Resp regular and unlabored, CTA. Heart: RRR, S1, S2, no murmur; no  rub. Abdomen: Soft, non-tender, non-distended with normoactive bowel sounds. No hepatomegaly. No rebound/guarding. No obvious abdominal masses. Extremities: No clubbing, cyanosis, no edema. Distal pedal pulses are 1+ bilaterally. Neuro: Alert and oriented X 3. Moves all extremities spontaneously. Psych: Normal affect.  Labs    Chemistry Recent Labs Lab 08/08/17 1613 08/09/17 0429  NA 147* 138  K 3.9 4.3  CL  --  107  CO2  --  26  GLUCOSE 89 127*  BUN  --  27*  CREATININE  --  1.79*  CALCIUM  --  8.5*  PROT  --  5.7*  ALBUMIN  --  3.1*  AST  --  14*  ALT  --  10*  ALKPHOS  --  57  BILITOT  --  0.8  GFRNONAA  --  37*  GFRAA  --  43*  ANIONGAP  --  5     Hematology Recent Labs Lab 08/08/17 1613 08/09/17 0429 08/10/17 0442  WBC  --  9.2 11.9*  RBC  --  3.51* 3.02*  HGB 10.9* 10.1* 8.9*  HCT 32.0* 32.2* 27.1*  MCV  --  91.7 89.7  MCH  --  28.8 29.5  MCHC  --  31.4 32.8  RDW  --  14.3 14.2  PLT  --  175 153    Cardiac EnzymesNo results for input(s): TROPONINI in the last 168 hours. No results for input(s): TROPIPOC in the last 168 hours.   BNPNo results for input(s): BNP, PROBNP in the last 168 hours.   DDimer No results for input(s): DDIMER in the last 168 hours.   Radiology    Dg Knee Left Port  Result Date: 08/08/2017 CLINICAL DATA:  Postop from left knee arthroplasty. EXAM: PORTABLE LEFT KNEE - 1-2 VIEW COMPARISON:  None. FINDINGS: Total knee arthroplasty with all 3 components in appropriate position. No evidence of fracture or dislocation. Postop soft tissue gas noted. IMPRESSION: Expected postoperative appearance of knee arthroplasty. No complication identified. Electronically Signed   By: Earle Gell M.D.   On: 08/08/2017 18:14     Telemetry    Sinus  - Personally Reviewed  ECG    No new tracings - Personally Reviewed   Cardiac Studies   Echocardiogram 08/09/17: Study Conclusions - Left ventricle: The cavity size was normal. Wall thickness  was   increased in a pattern of moderate LVH. Systolic function was   normal. The estimated ejection fraction was in the range of 55%   to 60%. Wall motion was normal; there were no regional wall   motion abnormalities. Doppler parameters are consistent with   abnormal left ventricular relaxation (grade 1 diastolic   dysfunction). - Mitral valve: There was mild regurgitation. - Right atrium: The atrium was mildly dilated.  Patient Profile     71 y.o. male with a hx of NICM, CKD, DM, and prostate CA who is being seen for the evaluation of NSVT during ortho surgery.  Assessment & Plan    1. NSVT, grade 1 DD, normal EF - no further evidence of NSVT on telemetry - echo with normal LV function and grade 1 DD - no indication for ICD at this time, no further ischemic evaluation needed - continue ASA, lipitor, ACEI, and coreg, no medication changes OK to discharge from  Cardiology standpoint. Will arrange follow up with our clinic.    Signed, Tami Lin Duke , PA-C 9:45 AM 08/10/2017 Pager: [phone]

## 2017-08-10 NOTE — Progress Notes (Addendum)
Physical Therapy Treatment Patient Details Name: Todd Robertson MRN: [id number] DOB: 05-19-1946 Today's Date: 08/10/2017    History of Present Illness 71 y.o. male admitted for left TKA with post op NSVT. PMHx: NICM, CKD, DM, obesity and prostate CA     PT Comments    Pt performed gait and reviewed HEP in prep for d/c home.  Pt wearing shoes during session and presents with decreased foot clearance.  Pt able to follow cues to improve foot clearance and is safe to d/c home with support from his wife.     Follow Up Recommendations  Home health PT     Equipment Recommendations  Rolling walker with 5" wheels    Recommendations for Other Services       Precautions / Restrictions Precautions Precautions: Knee Precaution Comments: verbally reviewed precautions  Restrictions Weight Bearing Restrictions: Yes LLE Weight Bearing: Weight bearing as tolerated    Mobility  Bed Mobility               General bed mobility comments: Pt sitting in Bathroom on BSC on arrival.    Transfers Overall transfer level: Modified independent Equipment used: Rolling walker (2 wheeled) Transfers: Sit to/from Stand Sit to Stand: Modified independent (Device/Increase time)         General transfer comment: Good technique observed.    Ambulation/Gait Ambulation/Gait assistance: Supervision Ambulation Distance (Feet): 300 Feet Assistive device: Rolling walker (2 wheeled) Gait Pattern/deviations: Step-through pattern;Trunk flexed   Gait velocity interpretation: Below normal speed for age/gender General Gait Details: Cues for B foot clearance and postural awareness.  Pt continues to progress well.     Stairs            Wheelchair Mobility    Modified Rankin (Stroke Patients Only)       Balance Overall balance assessment: No apparent balance deficits (not formally assessed)                                          Cognition Arousal/Alertness:  Awake/alert Behavior During Therapy: WFL for tasks assessed/performed Overall Cognitive Status: Within Functional Limits for tasks assessed                                        Exercises Total Joint Exercises Ankle Circles/Pumps: AROM;Both;20 reps;Supine Quad Sets: AROM;Left;10 reps;Supine Towel Squeeze: AROM;Both;10 reps;Supine Short Arc Quad: AROM;Left;10 reps;Supine Heel Slides: AROM;Left;10 reps;Supine Hip ABduction/ADduction: AROM;Left;10 reps;Supine Straight Leg Raises: AROM;Left;10 reps;Supine Goniometric ROM: 0-89 degrees flexion in L knee.      General Comments        Pertinent Vitals/Pain Pain Assessment: 0-10 Pain Score: 6  Pain Location: left knee Pain Descriptors / Indicators: Aching Pain Intervention(s): Monitored during session;Repositioned    Home Living                      Prior Function            PT Goals (current goals can now be found in the care plan section) Acute Rehab PT Goals Patient Stated Goal: return home and mow the yard Potential to Achieve Goals: Good Progress towards PT goals: Progressing toward goals    Frequency    7X/week      PT Plan Current plan remains appropriate    Co-evaluation  AM-PAC PT "6 Clicks" Daily Activity  Outcome Measure  Difficulty turning over in bed (including adjusting bedclothes, sheets and blankets)?: None Difficulty moving from lying on back to sitting on the side of the bed? : None Difficulty sitting down on and standing up from a chair with arms (e.g., wheelchair, bedside commode, etc,.)?: None Help needed moving to and from a bed to chair (including a wheelchair)?: None Help needed walking in hospital room?: A Little Help needed climbing 3-5 steps with a railing? : A Little 6 Click Score: 22    End of Session Equipment Utilized During Treatment: Gait belt Activity Tolerance: Patient tolerated treatment well Patient left: in chair;with call  bell/phone within reach Nurse Communication: Mobility status;Weight bearing status PT Visit Diagnosis: Difficulty in walking, not elsewhere classified (R26.2) Pain - Right/Left: Left Pain - part of body: Knee     Time: 2951-8841 PT Time Calculation (min) (ACUTE ONLY): 15 min  Charges:  $Gait Training: 8-22 mins                    G Codes:       Governor Rooks, PTA pager [phone]    Cristela Blue 08/10/2017, 1:46 PM

## 2017-08-10 NOTE — Discharge Summary (Signed)
Physician Discharge Summary  Patient ID: Todd Robertson MRN: [id number] DOB/AGE: 06-03-1946 71 y.o.  Admit date: 08/08/2017 Discharge date: 08/10/2017  Admission Diagnoses:  Osteoarthritis of left knee  Discharge Diagnoses:  Principal Problem:   Osteoarthritis of left knee Active Problems:   CKD (chronic kidney disease) stage 3, GFR 30-59 ml/min (HCC)   Degenerative arthritis of left knee   NSVT (nonsustained ventricular tachycardia) (HCC)   Past Medical History:  Diagnosis Date  . Arthritis   . [facility])    prostate  . Cardiomyopathy    secondary  . CKD (chronic kidney disease)   . DM2 (diabetes mellitus, type 2) ([place])   . HTN (hypertension)    unspec  . Nonischemic cardiomyopathy ([place])    a. EF 40-50% by most recent 2-D echo b. EF 25% by cardiac cath in 2004  . Overweight(278.02)     Surgeries: Procedure(s): LEFT TOTAL KNEE ARTHROPLASTY WITH COMPUTER NAVIGATION on 08/08/2017   Consultants (if any):   Discharged Condition: Improved  Hospital Course: Todd Robertson is an 71 y.o. male who was admitted 08/08/2017 with a diagnosis of Osteoarthritis of left knee and went to the operating room on 08/08/2017 and underwent the above named procedures.    He was given perioperative antibiotics:  Anti-infectives    Start     Dose/Rate Route Frequency Ordered Stop   08/08/17 2130  ceFAZolin (ANCEF) IVPB 2g/100 mL premix     2 g 200 mL/hr over 30 Minutes Intravenous Every 6 hours 08/08/17 2059 08/09/17 0545   08/08/17 1143  ceFAZolin (ANCEF) 3 g in dextrose 5 % 50 mL IVPB     3 g 130 mL/hr over 30 Minutes Intravenous On call to O.R. 08/08/17 1143 08/08/17 1424    .  Intraoperatively, he had nonsustained ventricular tachycardia. Postoperatively, there was no recurrence. Cardiology saw the patient, and a new echocardiogram was obtained, showing normal LVEF. He was medically cleared.   He was given sequential compression devices, early ambulation, and ASA for DVT  prophylaxis.  He benefited maximally from the hospital stay and there were no complications.    Recent vital signs:  Vitals:   08/10/17 0411 08/10/17 1202  BP: 135/74   Pulse: 67   Resp: 20   Temp: 98.4 F (36.9 C)   SpO2: 98% 98%    Recent laboratory studies:  Lab Results  Component Value Date   HGB 8.9 (L) 08/10/2017   HGB 10.1 (L) 08/09/2017   HGB 10.9 (L) 08/08/2017   Lab Results  Component Value Date   WBC 11.9 (H) 08/10/2017   PLT 153 08/10/2017   No results found for: INR Lab Results  Component Value Date   NA 138 08/09/2017   K 4.3 08/09/2017   CL 107 08/09/2017   CO2 26 08/09/2017   BUN 27 (H) 08/09/2017   CREATININE 1.79 (H) 08/09/2017   GLUCOSE 127 (H) 08/09/2017    Discharge Medications:   Allergies as of 08/10/2017   No Known Allergies     Medication List    STOP taking these medications   ASPIR-LOW 81 MG EC tablet Generic drug:  aspirin Replaced by:  aspirin 81 MG chewable tablet   diclofenac 75 MG EC tablet Commonly known as:  VOLTAREN   metFORMIN 500 MG tablet Commonly known as:  GLUCOPHAGE     TAKE these medications   allopurinol 300 MG tablet Commonly known as:  ZYLOPRIM Take 300 mg by mouth every morning.   aspirin 81 MG  chewable tablet Chew 1 tablet (81 mg total) by mouth 2 (two) times daily. Replaces:  ASPIR-LOW 81 MG EC tablet   atorvastatin 40 MG tablet Commonly known as:  LIPITOR Take 1 tablet (40 mg total) by mouth daily. What changed:  when to take this   benazepril 20 MG tablet Commonly known as:  LOTENSIN Take 40 mg by mouth every morning.   budesonide-formoterol 160-4.5 MCG/ACT inhaler Commonly known as:  SYMBICORT Inhale 1 puff into the lungs daily. Pay take 2nd dose in the evening if needed   bumetanide 1 MG tablet Commonly known as:  BUMEX Take 2 mg by mouth every morning.   carvedilol 25 MG tablet Commonly known as:  COREG Take 1 tablet (25 mg total) by mouth 2 (two) times daily.   docusate sodium  100 MG capsule Commonly known as:  COLACE Take 1 capsule (100 mg total) by mouth 2 (two) times daily.   FIASP 100 UNIT/ML injection Generic drug:  insulin aspart Inject into the skin See admin instructions. Sliding scale at least every morning patient waiting on clarification from PCP on how he is supposed to take medication.   HYDROcodone-acetaminophen 5-325 MG tablet Commonly known as:  NORCO/VICODIN Take 1-2 tablets by mouth every 4 (four) hours as needed (breakthrough pain). Do not exceed 10 tablets per day   ondansetron 4 MG tablet Commonly known as:  ZOFRAN Take 1 tablet (4 mg total) by mouth every 6 (six) hours as needed for nausea.   senna 8.6 MG Tabs tablet Commonly known as:  SENOKOT Take 2 tablets (17.2 mg total) by mouth at bedtime.            Durable Medical Equipment        Start     Ordered   08/09/17 1448  For home use only DME Walker rolling  Once    Question:  Patient needs a walker to treat with the following condition  Answer:  S/P total knee arthroplasty, left   08/09/17 1448      Diagnostic Studies: Dg Knee Left Port  Result Date: 08/08/2017 CLINICAL DATA:  Postop from left knee arthroplasty. EXAM: PORTABLE LEFT KNEE - 1-2 VIEW COMPARISON:  None. FINDINGS: Total knee arthroplasty with all 3 components in appropriate position. No evidence of fracture or dislocation. Postop soft tissue gas noted. IMPRESSION: Expected postoperative appearance of knee arthroplasty. No complication identified. Electronically Signed   By: Earle Gell M.D.   On: 08/08/2017 18:14    Disposition: 01-Home or Self Care  Discharge Instructions    Call MD / Call 911    Complete by:  As directed    If you experience chest pain or shortness of breath, CALL 911 and be transported to the hospital emergency room.  If you develope a fever above 101 F, pus (white drainage) or increased drainage or redness at the wound, or calf pain, call your surgeon's office.   Constipation  Prevention    Complete by:  As directed    Drink plenty of fluids.  Prune juice may be helpful.  You may use a stool softener, such as Colace (over the counter) 100 mg twice a day.  Use MiraLax (over the counter) for constipation as needed.   Diet - low sodium heart healthy    Complete by:  As directed    Do not put a pillow under the knee. Place it under the heel.    Complete by:  As directed    Driving restrictions  Complete by:  As directed    No driving for 6 weeks   Increase activity slowly as tolerated    Complete by:  As directed    Lifting restrictions    Complete by:  As directed    No lifting for 6 weeks   TED hose    Complete by:  As directed    Use stockings (TED hose) for 2 weeks on both leg(s).  You may remove them at night for sleeping.      Follow-up Information    Donnie Gedeon, Aaron Edelman, MD. Schedule an appointment as soon as possible for a visit in 2 weeks.   Specialty:  Orthopedic Surgery Why:  For wound re-check Contact information: [address] [address] [phone]            Signed: Elie Goody 08/10/2017, 1:05 PM

## 2017-08-10 NOTE — Progress Notes (Signed)
Cardiology states pt can be discharged.

## 2017-08-10 NOTE — Progress Notes (Signed)
Pt discharge instructions reviewed with pt and his wife. Pt and wife verbalize understanding. Pt belongings with pt. Pt is not in distress. Pt discharged via wheelchair.

## 2017-08-10 NOTE — Progress Notes (Signed)
   Subjective:  Patient reports pain as mild to moderate.  Denies N/V/CP/SOB. Doing great with therapy. Echo done yesterday.  Objective:   VITALS:   Vitals:   08/09/17 0814 08/09/17 1238 08/09/17 2044 08/10/17 0411  BP: 132/69 121/83 (!) 124/59 135/74  Pulse: 72 67 85 67  Resp: 16 15 18 20   Temp: 98.3 F (36.8 C) 98.4 F (36.9 C) 98.5 F (36.9 C) 98.4 F (36.9 C)  TempSrc: Oral Oral Oral Oral  SpO2: 94% 99% 97% 98%  Weight:      Height:        NAD ABD soft Sensation intact distally Intact pulses distally Dorsiflexion/Plantar flexion intact Incision: dressing C/D/I Compartment soft   Lab Results  Component Value Date   WBC 11.9 (H) 08/10/2017   HGB 8.9 (L) 08/10/2017   HCT 27.1 (L) 08/10/2017   MCV 89.7 08/10/2017   PLT 153 08/10/2017   BMET    Component Value Date/Time   NA 138 08/09/2017 0429   K 4.3 08/09/2017 0429   CL 107 08/09/2017 0429   CO2 26 08/09/2017 0429   GLUCOSE 127 (H) 08/09/2017 0429   BUN 27 (H) 08/09/2017 0429   CREATININE 1.79 (H) 08/09/2017 0429   CALCIUM 8.5 (L) 08/09/2017 0429   GFRNONAA 37 (L) 08/09/2017 0429   GFRAA 43 (L) 08/09/2017 0429     Assessment/Plan: 2 Days Post-Op   Principal Problem:   Osteoarthritis of left knee Active Problems:   CKD (chronic kidney disease) stage 3, GFR 30-59 ml/min (HCC)   Degenerative arthritis of left knee   NSVT (nonsustained ventricular tachycardia) (HCC)   WBAT with walker DVT ppx: ASA, SCDs, TEDs PO pain control DM2: glucose < 200 CKD3: stable ABLA: asymptomatic, monitor NSVT: cardiology following Dispo: D/C home if ok with cardiology, outpatient PT set up, has DME already   Elie Goody 08/10/2017, 7:56 AM   Rod Can, MD Cell [phone]

## 2017-08-12 DIAGNOSIS — M25662 Stiffness of left knee, not elsewhere classified: Secondary | ICD-10-CM | POA: Diagnosis not present

## 2017-08-12 DIAGNOSIS — R2689 Other abnormalities of gait and mobility: Secondary | ICD-10-CM | POA: Diagnosis not present

## 2017-08-12 DIAGNOSIS — Z96652 Presence of left artificial knee joint: Secondary | ICD-10-CM | POA: Diagnosis not present

## 2017-08-12 DIAGNOSIS — Z471 Aftercare following joint replacement surgery: Secondary | ICD-10-CM | POA: Diagnosis not present

## 2017-08-12 DIAGNOSIS — M6281 Muscle weakness (generalized): Secondary | ICD-10-CM | POA: Diagnosis not present

## 2017-08-15 DIAGNOSIS — Z96652 Presence of left artificial knee joint: Secondary | ICD-10-CM | POA: Diagnosis not present

## 2017-08-15 DIAGNOSIS — R2689 Other abnormalities of gait and mobility: Secondary | ICD-10-CM | POA: Diagnosis not present

## 2017-08-15 DIAGNOSIS — Z471 Aftercare following joint replacement surgery: Secondary | ICD-10-CM | POA: Diagnosis not present

## 2017-08-15 DIAGNOSIS — M25662 Stiffness of left knee, not elsewhere classified: Secondary | ICD-10-CM | POA: Diagnosis not present

## 2017-08-15 DIAGNOSIS — M6281 Muscle weakness (generalized): Secondary | ICD-10-CM | POA: Diagnosis not present

## 2017-08-16 DIAGNOSIS — J449 Chronic obstructive pulmonary disease, unspecified: Secondary | ICD-10-CM | POA: Diagnosis not present

## 2017-08-16 DIAGNOSIS — Z299 Encounter for prophylactic measures, unspecified: Secondary | ICD-10-CM | POA: Diagnosis not present

## 2017-08-16 DIAGNOSIS — M6281 Muscle weakness (generalized): Secondary | ICD-10-CM | POA: Diagnosis not present

## 2017-08-16 DIAGNOSIS — N183 Chronic kidney disease, stage 3 (moderate): Secondary | ICD-10-CM | POA: Diagnosis not present

## 2017-08-16 DIAGNOSIS — Z471 Aftercare following joint replacement surgery: Secondary | ICD-10-CM | POA: Diagnosis not present

## 2017-08-16 DIAGNOSIS — E1122 Type 2 diabetes mellitus with diabetic chronic kidney disease: Secondary | ICD-10-CM | POA: Diagnosis not present

## 2017-08-16 DIAGNOSIS — M25569 Pain in unspecified knee: Secondary | ICD-10-CM | POA: Diagnosis not present

## 2017-08-16 DIAGNOSIS — M25662 Stiffness of left knee, not elsewhere classified: Secondary | ICD-10-CM | POA: Diagnosis not present

## 2017-08-16 DIAGNOSIS — Z96652 Presence of left artificial knee joint: Secondary | ICD-10-CM | POA: Diagnosis not present

## 2017-08-16 DIAGNOSIS — R2689 Other abnormalities of gait and mobility: Secondary | ICD-10-CM | POA: Diagnosis not present

## 2017-08-16 DIAGNOSIS — C61 Malignant neoplasm of prostate: Secondary | ICD-10-CM | POA: Diagnosis not present

## 2017-08-16 DIAGNOSIS — I429 Cardiomyopathy, unspecified: Secondary | ICD-10-CM | POA: Diagnosis not present

## 2017-08-16 DIAGNOSIS — E1165 Type 2 diabetes mellitus with hyperglycemia: Secondary | ICD-10-CM | POA: Diagnosis not present

## 2017-08-16 DIAGNOSIS — Z6835 Body mass index (BMI) 35.0-35.9, adult: Secondary | ICD-10-CM | POA: Diagnosis not present

## 2017-08-19 DIAGNOSIS — R2689 Other abnormalities of gait and mobility: Secondary | ICD-10-CM | POA: Diagnosis not present

## 2017-08-19 DIAGNOSIS — M6281 Muscle weakness (generalized): Secondary | ICD-10-CM | POA: Diagnosis not present

## 2017-08-19 DIAGNOSIS — Z471 Aftercare following joint replacement surgery: Secondary | ICD-10-CM | POA: Diagnosis not present

## 2017-08-19 DIAGNOSIS — Z96652 Presence of left artificial knee joint: Secondary | ICD-10-CM | POA: Diagnosis not present

## 2017-08-19 DIAGNOSIS — M25662 Stiffness of left knee, not elsewhere classified: Secondary | ICD-10-CM | POA: Diagnosis not present

## 2017-08-22 DIAGNOSIS — Z471 Aftercare following joint replacement surgery: Secondary | ICD-10-CM | POA: Diagnosis not present

## 2017-08-22 DIAGNOSIS — M6281 Muscle weakness (generalized): Secondary | ICD-10-CM | POA: Diagnosis not present

## 2017-08-22 DIAGNOSIS — R2689 Other abnormalities of gait and mobility: Secondary | ICD-10-CM | POA: Diagnosis not present

## 2017-08-22 DIAGNOSIS — Z96652 Presence of left artificial knee joint: Secondary | ICD-10-CM | POA: Diagnosis not present

## 2017-08-22 DIAGNOSIS — M25662 Stiffness of left knee, not elsewhere classified: Secondary | ICD-10-CM | POA: Diagnosis not present

## 2017-08-23 DIAGNOSIS — Z471 Aftercare following joint replacement surgery: Secondary | ICD-10-CM | POA: Diagnosis not present

## 2017-08-23 DIAGNOSIS — Z96652 Presence of left artificial knee joint: Secondary | ICD-10-CM | POA: Diagnosis not present

## 2017-08-24 DIAGNOSIS — Z471 Aftercare following joint replacement surgery: Secondary | ICD-10-CM | POA: Diagnosis not present

## 2017-08-24 DIAGNOSIS — M25662 Stiffness of left knee, not elsewhere classified: Secondary | ICD-10-CM | POA: Diagnosis not present

## 2017-08-24 DIAGNOSIS — R2689 Other abnormalities of gait and mobility: Secondary | ICD-10-CM | POA: Diagnosis not present

## 2017-08-24 DIAGNOSIS — M6281 Muscle weakness (generalized): Secondary | ICD-10-CM | POA: Diagnosis not present

## 2017-08-24 DIAGNOSIS — Z96652 Presence of left artificial knee joint: Secondary | ICD-10-CM | POA: Diagnosis not present

## 2017-08-25 DIAGNOSIS — R2689 Other abnormalities of gait and mobility: Secondary | ICD-10-CM | POA: Diagnosis not present

## 2017-08-25 DIAGNOSIS — M25662 Stiffness of left knee, not elsewhere classified: Secondary | ICD-10-CM | POA: Diagnosis not present

## 2017-08-25 DIAGNOSIS — B351 Tinea unguium: Secondary | ICD-10-CM | POA: Diagnosis not present

## 2017-08-25 DIAGNOSIS — Z471 Aftercare following joint replacement surgery: Secondary | ICD-10-CM | POA: Diagnosis not present

## 2017-08-25 DIAGNOSIS — Z96652 Presence of left artificial knee joint: Secondary | ICD-10-CM | POA: Diagnosis not present

## 2017-08-25 DIAGNOSIS — M6281 Muscle weakness (generalized): Secondary | ICD-10-CM | POA: Diagnosis not present

## 2017-08-25 DIAGNOSIS — E1140 Type 2 diabetes mellitus with diabetic neuropathy, unspecified: Secondary | ICD-10-CM | POA: Diagnosis not present

## 2017-08-25 DIAGNOSIS — L110 Acquired keratosis follicularis: Secondary | ICD-10-CM | POA: Diagnosis not present

## 2017-08-25 DIAGNOSIS — E1151 Type 2 diabetes mellitus with diabetic peripheral angiopathy without gangrene: Secondary | ICD-10-CM | POA: Diagnosis not present

## 2017-08-29 DIAGNOSIS — Z96652 Presence of left artificial knee joint: Secondary | ICD-10-CM | POA: Diagnosis not present

## 2017-08-29 DIAGNOSIS — Z471 Aftercare following joint replacement surgery: Secondary | ICD-10-CM | POA: Diagnosis not present

## 2017-08-29 DIAGNOSIS — R2689 Other abnormalities of gait and mobility: Secondary | ICD-10-CM | POA: Diagnosis not present

## 2017-08-29 DIAGNOSIS — M25662 Stiffness of left knee, not elsewhere classified: Secondary | ICD-10-CM | POA: Diagnosis not present

## 2017-08-29 DIAGNOSIS — M6281 Muscle weakness (generalized): Secondary | ICD-10-CM | POA: Diagnosis not present

## 2017-08-31 DIAGNOSIS — Z471 Aftercare following joint replacement surgery: Secondary | ICD-10-CM | POA: Diagnosis not present

## 2017-08-31 DIAGNOSIS — M25662 Stiffness of left knee, not elsewhere classified: Secondary | ICD-10-CM | POA: Diagnosis not present

## 2017-08-31 DIAGNOSIS — Z96652 Presence of left artificial knee joint: Secondary | ICD-10-CM | POA: Diagnosis not present

## 2017-08-31 DIAGNOSIS — M6281 Muscle weakness (generalized): Secondary | ICD-10-CM | POA: Diagnosis not present

## 2017-08-31 DIAGNOSIS — R2689 Other abnormalities of gait and mobility: Secondary | ICD-10-CM | POA: Diagnosis not present

## 2017-08-31 DIAGNOSIS — Z23 Encounter for immunization: Secondary | ICD-10-CM | POA: Diagnosis not present

## 2017-09-01 DIAGNOSIS — E7800 Pure hypercholesterolemia, unspecified: Secondary | ICD-10-CM | POA: Diagnosis not present

## 2017-09-01 DIAGNOSIS — I10 Essential (primary) hypertension: Secondary | ICD-10-CM | POA: Diagnosis not present

## 2017-09-01 DIAGNOSIS — M159 Polyosteoarthritis, unspecified: Secondary | ICD-10-CM | POA: Diagnosis not present

## 2017-09-02 DIAGNOSIS — R2689 Other abnormalities of gait and mobility: Secondary | ICD-10-CM | POA: Diagnosis not present

## 2017-09-02 DIAGNOSIS — Z471 Aftercare following joint replacement surgery: Secondary | ICD-10-CM | POA: Diagnosis not present

## 2017-09-02 DIAGNOSIS — Z96652 Presence of left artificial knee joint: Secondary | ICD-10-CM | POA: Diagnosis not present

## 2017-09-02 DIAGNOSIS — M25662 Stiffness of left knee, not elsewhere classified: Secondary | ICD-10-CM | POA: Diagnosis not present

## 2017-09-02 DIAGNOSIS — M6281 Muscle weakness (generalized): Secondary | ICD-10-CM | POA: Diagnosis not present

## 2017-09-07 DIAGNOSIS — R2689 Other abnormalities of gait and mobility: Secondary | ICD-10-CM | POA: Diagnosis not present

## 2017-09-07 DIAGNOSIS — M25662 Stiffness of left knee, not elsewhere classified: Secondary | ICD-10-CM | POA: Diagnosis not present

## 2017-09-07 DIAGNOSIS — M6281 Muscle weakness (generalized): Secondary | ICD-10-CM | POA: Diagnosis not present

## 2017-09-07 DIAGNOSIS — Z471 Aftercare following joint replacement surgery: Secondary | ICD-10-CM | POA: Diagnosis not present

## 2017-09-07 DIAGNOSIS — Z96652 Presence of left artificial knee joint: Secondary | ICD-10-CM | POA: Diagnosis not present

## 2017-09-08 DIAGNOSIS — Z299 Encounter for prophylactic measures, unspecified: Secondary | ICD-10-CM | POA: Diagnosis not present

## 2017-09-08 DIAGNOSIS — Z6834 Body mass index (BMI) 34.0-34.9, adult: Secondary | ICD-10-CM | POA: Diagnosis not present

## 2017-09-08 DIAGNOSIS — I10 Essential (primary) hypertension: Secondary | ICD-10-CM | POA: Diagnosis not present

## 2017-09-08 DIAGNOSIS — J449 Chronic obstructive pulmonary disease, unspecified: Secondary | ICD-10-CM | POA: Diagnosis not present

## 2017-09-08 DIAGNOSIS — M7020 Olecranon bursitis, unspecified elbow: Secondary | ICD-10-CM | POA: Diagnosis not present

## 2017-09-08 DIAGNOSIS — N183 Chronic kidney disease, stage 3 (moderate): Secondary | ICD-10-CM | POA: Diagnosis not present

## 2017-09-08 DIAGNOSIS — M25521 Pain in right elbow: Secondary | ICD-10-CM | POA: Diagnosis not present

## 2017-09-14 DIAGNOSIS — R2689 Other abnormalities of gait and mobility: Secondary | ICD-10-CM | POA: Diagnosis not present

## 2017-09-14 DIAGNOSIS — E7800 Pure hypercholesterolemia, unspecified: Secondary | ICD-10-CM | POA: Diagnosis not present

## 2017-09-14 DIAGNOSIS — M159 Polyosteoarthritis, unspecified: Secondary | ICD-10-CM | POA: Diagnosis not present

## 2017-09-14 DIAGNOSIS — Z96652 Presence of left artificial knee joint: Secondary | ICD-10-CM | POA: Diagnosis not present

## 2017-09-14 DIAGNOSIS — I10 Essential (primary) hypertension: Secondary | ICD-10-CM | POA: Diagnosis not present

## 2017-09-14 DIAGNOSIS — M6281 Muscle weakness (generalized): Secondary | ICD-10-CM | POA: Diagnosis not present

## 2017-09-14 DIAGNOSIS — Z471 Aftercare following joint replacement surgery: Secondary | ICD-10-CM | POA: Diagnosis not present

## 2017-09-14 DIAGNOSIS — M25662 Stiffness of left knee, not elsewhere classified: Secondary | ICD-10-CM | POA: Diagnosis not present

## 2017-09-27 DIAGNOSIS — Z96652 Presence of left artificial knee joint: Secondary | ICD-10-CM | POA: Diagnosis not present

## 2017-09-27 DIAGNOSIS — Z471 Aftercare following joint replacement surgery: Secondary | ICD-10-CM | POA: Diagnosis not present

## 2017-09-27 DIAGNOSIS — M1711 Unilateral primary osteoarthritis, right knee: Secondary | ICD-10-CM | POA: Diagnosis not present

## 2017-10-10 ENCOUNTER — Ambulatory Visit (INDEPENDENT_AMBULATORY_CARE_PROVIDER_SITE_OTHER): Payer: Medicare Other | Admitting: Cardiology

## 2017-10-10 ENCOUNTER — Encounter: Payer: Self-pay | Admitting: *Deleted

## 2017-10-10 ENCOUNTER — Other Ambulatory Visit: Payer: Self-pay

## 2017-10-10 ENCOUNTER — Encounter: Payer: Self-pay | Admitting: Cardiology

## 2017-10-10 VITALS — BP 160/80 | HR 70 | Ht 74.0 in | Wt 276.0 lb

## 2017-10-10 DIAGNOSIS — I428 Other cardiomyopathies: Secondary | ICD-10-CM

## 2017-10-10 DIAGNOSIS — E782 Mixed hyperlipidemia: Secondary | ICD-10-CM

## 2017-10-10 DIAGNOSIS — I10 Essential (primary) hypertension: Secondary | ICD-10-CM | POA: Diagnosis not present

## 2017-10-10 NOTE — Progress Notes (Signed)
Clinical Summary Todd Robertson is a 71 y.o.male  seen today for follow up of the following medical problems.   1. NICM  - previously significant LV dysfunction with LVEF 25-30% in 2004, last echo 2011 shows normalized LVEF at 60%. Cath 2004 with no significant coronary disease  - 08/2017 echo LVEF 55-60%   -no SOB/DOE, no LE edema since last visit   2. HTN  - checks at home 2-3 times a week. Typically 130s/70s - compliant with meds   3. HL  - he is compliant with statin - labs next week with pcp  4. Prostate Cancer - treated radiation at [facility]  - followed by urologist.   5. CKD - last Cr 1.79   6. NSVT - episode during recent knee replacement - repeat echo 08/2017 showed stable LVEF 55-60% - no recent palpitations     Past Medical History:  Diagnosis Date  . Arthritis   . Cancer [facility])    prostate  . Cardiomyopathy    secondary  . CKD (chronic kidney disease)   . DM2 (diabetes mellitus, type 2) ([place])   . HTN (hypertension)    unspec  . Nonischemic cardiomyopathy ([place])    a. EF 40-50% by most recent 2-D echo b. EF 25% by cardiac cath in 2004  . Overweight(278.02)      No Known Allergies   Current Outpatient Medications  Medication Sig Dispense Refill  . allopurinol (ZYLOPRIM) 300 MG tablet Take 300 mg by mouth every morning.    Marland Kitchen aspirin 81 MG chewable tablet Chew 1 tablet (81 mg total) by mouth 2 (two) times daily. 60 tablet 1  . atorvastatin (LIPITOR) 40 MG tablet Take 1 tablet (40 mg total) by mouth daily. (Patient taking differently: Take 40 mg by mouth every evening. ) 90 tablet 3  . benazepril (LOTENSIN) 20 MG tablet Take 40 mg by mouth every morning.    . budesonide-formoterol (SYMBICORT) 160-4.5 MCG/ACT inhaler Inhale 1 puff into the lungs daily. Pay take 2nd dose in the evening if needed    . bumetanide (BUMEX) 1 MG tablet Take 2 mg by mouth every morning.    . carvedilol (COREG) 25 MG tablet Take 1 tablet (25 mg total) by mouth  2 (two) times daily. 180 tablet 3  . docusate sodium (COLACE) 100 MG capsule Take 1 capsule (100 mg total) by mouth 2 (two) times daily. 60 capsule 1  . HYDROcodone-acetaminophen (NORCO/VICODIN) 5-325 MG tablet Take 1-2 tablets by mouth every 4 (four) hours as needed (breakthrough pain). Do not exceed 10 tablets per day 60 tablet 0  . insulin aspart (FIASP) 100 UNIT/ML injection Inject into the skin See admin instructions. Sliding scale at least every morning patient waiting on clarification from PCP on how he is supposed to take medication.    . ondansetron (ZOFRAN) 4 MG tablet Take 1 tablet (4 mg total) by mouth every 6 (six) hours as needed for nausea. 20 tablet 0  . senna (SENOKOT) 8.6 MG TABS tablet Take 2 tablets (17.2 mg total) by mouth at bedtime. 120 each 0   No current facility-administered medications for this visit.      Past Surgical History:  Procedure Laterality Date  . COLONOSCOPY    . HERNIA REPAIR    . KNEE ARTHROPLASTY Left 08/08/2017   Procedure: LEFT TOTAL KNEE ARTHROPLASTY WITH COMPUTER NAVIGATION;  Surgeon: Rod Can, MD;  Location: [place];  Service: Orthopedics;  Laterality: Left;  Needs RNFA  No Known Allergies    Family History  Problem Relation Age of Onset  . Hypertension Mother      Social History Mr. Newman reports that he quit smoking about 32 years ago. His smoking use included cigarettes. He started smoking about 51 years ago. He has a 10.00 pack-year smoking history. he has never used smokeless tobacco. Mr. Medina reports that he does not drink alcohol.   Review of Systems CONSTITUTIONAL: No weight loss, fever, chills, weakness or fatigue.  HEENT: Eyes: No visual loss, blurred vision, double vision or yellow sclerae.No hearing loss, sneezing, congestion, runny nose or sore throat.  SKIN: No rash or itching.  CARDIOVASCULAR: per hpi RESPIRATORY: No shortness of breath, cough or sputum.  GASTROINTESTINAL: No anorexia, nausea,  vomiting or diarrhea. No abdominal pain or blood.  GENITOURINARY: No burning on urination, no polyuria NEUROLOGICAL: No headache, dizziness, syncope, paralysis, ataxia, numbness or tingling in the extremities. No change in bowel or bladder control.  MUSCULOSKELETAL: No muscle, back pain, joint pain or stiffness.  LYMPHATICS: No enlarged nodes. No history of splenectomy.  PSYCHIATRIC: No history of depression or anxiety.  ENDOCRINOLOGIC: No reports of sweating, cold or heat intolerance. No polyuria or polydipsia.  Marland Kitchen   Physical Examination Vitals:   10/10/17 1357  BP: (!) 160/80  Pulse: 70  SpO2: 95%   Vitals:   10/10/17 1357  Weight: 276 lb (125.2 kg)  Height: 6\' 2"  (1.88 m)    Gen: resting comfortably, no acute distress HEENT: no scleral icterus, pupils equal round and reactive, no palptable cervical adenopathy,  CV: RRR, no m/r/g, no jvd Resp: Clear to auscultation bilaterally GI: abdomen is soft, non-tender, non-distended, normal bowel sounds, no hepatosplenomegaly MSK: extremities are warm, no edema.  Skin: warm, no rash Neuro:  no focal deficits Psych: appropriate affect   Diagnostic Studies 05/2010 Echo: Normal LV systolic function 10%, mod LAE, mild LVH.   08/31/13 Clinic EKG: sinus rhythm, 1st degree AV block  12/2016 AAA Screen [place]: normal    Assessment and Plan  1. NICM  - previously significant LV systolic dysfunction, that has since normalized on medical therapy  - asymptomatic, continue current meds  2. HTN  - elevated in clinic but home numbers at goal, continue current meds  3. HL  - continue statin, f/u upcoming pcp labs     F/u 1 year      Arnoldo Lenis, M.D.,

## 2017-10-10 NOTE — Patient Instructions (Signed)

## 2017-10-12 ENCOUNTER — Encounter: Payer: Self-pay | Admitting: Cardiology

## 2017-10-13 DIAGNOSIS — M25662 Stiffness of left knee, not elsewhere classified: Secondary | ICD-10-CM | POA: Diagnosis not present

## 2017-10-13 DIAGNOSIS — Z96652 Presence of left artificial knee joint: Secondary | ICD-10-CM | POA: Diagnosis not present

## 2017-10-13 DIAGNOSIS — M6281 Muscle weakness (generalized): Secondary | ICD-10-CM | POA: Diagnosis not present

## 2017-10-13 DIAGNOSIS — Z471 Aftercare following joint replacement surgery: Secondary | ICD-10-CM | POA: Diagnosis not present

## 2017-10-13 DIAGNOSIS — R2689 Other abnormalities of gait and mobility: Secondary | ICD-10-CM | POA: Diagnosis not present

## 2017-10-14 DIAGNOSIS — I10 Essential (primary) hypertension: Secondary | ICD-10-CM | POA: Diagnosis not present

## 2017-10-14 DIAGNOSIS — M159 Polyosteoarthritis, unspecified: Secondary | ICD-10-CM | POA: Diagnosis not present

## 2017-10-14 DIAGNOSIS — E7800 Pure hypercholesterolemia, unspecified: Secondary | ICD-10-CM | POA: Diagnosis not present

## 2017-10-19 DIAGNOSIS — Z6834 Body mass index (BMI) 34.0-34.9, adult: Secondary | ICD-10-CM | POA: Diagnosis not present

## 2017-10-19 DIAGNOSIS — Z1339 Encounter for screening examination for other mental health and behavioral disorders: Secondary | ICD-10-CM | POA: Diagnosis not present

## 2017-10-19 DIAGNOSIS — E7800 Pure hypercholesterolemia, unspecified: Secondary | ICD-10-CM | POA: Diagnosis not present

## 2017-10-19 DIAGNOSIS — Z299 Encounter for prophylactic measures, unspecified: Secondary | ICD-10-CM | POA: Diagnosis not present

## 2017-10-19 DIAGNOSIS — Z125 Encounter for screening for malignant neoplasm of prostate: Secondary | ICD-10-CM | POA: Diagnosis not present

## 2017-10-19 DIAGNOSIS — Z1211 Encounter for screening for malignant neoplasm of colon: Secondary | ICD-10-CM | POA: Diagnosis not present

## 2017-10-19 DIAGNOSIS — Z1331 Encounter for screening for depression: Secondary | ICD-10-CM | POA: Diagnosis not present

## 2017-10-19 DIAGNOSIS — Z7189 Other specified counseling: Secondary | ICD-10-CM | POA: Diagnosis not present

## 2017-10-19 DIAGNOSIS — Z79899 Other long term (current) drug therapy: Secondary | ICD-10-CM | POA: Diagnosis not present

## 2017-10-19 DIAGNOSIS — J069 Acute upper respiratory infection, unspecified: Secondary | ICD-10-CM | POA: Diagnosis not present

## 2017-10-19 DIAGNOSIS — Z0000 Encounter for general adult medical examination without abnormal findings: Secondary | ICD-10-CM | POA: Diagnosis not present

## 2017-10-19 DIAGNOSIS — R5383 Other fatigue: Secondary | ICD-10-CM | POA: Diagnosis not present

## 2017-10-24 NOTE — Progress Notes (Signed)
Need orders in epic for 1-10- surg Pre op is 1-4

## 2017-10-25 ENCOUNTER — Ambulatory Visit: Payer: Self-pay | Admitting: Orthopedic Surgery

## 2017-11-02 ENCOUNTER — Ambulatory Visit: Payer: Self-pay | Admitting: Orthopedic Surgery

## 2017-11-02 NOTE — H&P (View-Only) (Signed)
TOTAL KNEE ADMISSION H&P  Patient is being admitted for right total knee arthroplasty.  Subjective:  Chief Complaint:right knee pain.  HPI: Todd Robertson, 71 y.o. male, has a history of pain and functional disability in the right knee due to arthritis and has failed non-surgical conservative treatments for greater than 12 weeks to includeNSAID's and/or analgesics, corticosteriod injections, flexibility and strengthening excercises, use of assistive devices, weight reduction as appropriate and activity modification.  Onset of symptoms was gradual, starting 5 years ago with gradually worsening course since that time. The patient noted no past surgery on the right knee(s).  Patient currently rates pain in the right knee(s) at 10 out of 10 with activity. Patient has night pain, worsening of pain with activity and weight bearing, pain that interferes with activities of daily living, pain with passive range of motion, crepitus and joint swelling.  Patient has evidence of subchondral cysts, subchondral sclerosis, periarticular osteophytes and joint space narrowing by imaging studies.  There is no active infection.  Patient Active Problem List   Diagnosis Date Noted  . Osteoarthritis of left knee 08/08/2017  . CKD (chronic kidney disease) stage 3, GFR 30-59 ml/min (HCC) 08/08/2017  . Degenerative arthritis of left knee 08/08/2017  . NSVT (nonsustained ventricular tachycardia) ([place])   . DM 12/26/2009  . OVERWEIGHT/OBESITY 08/08/2009  . HTN (hypertension) 08/08/2009  . CARDIOMYOPATHY, SECONDARY 08/08/2009   Past Medical History:  Diagnosis Date  . Arthritis   . Cancer [facility])    prostate  . Cardiomyopathy    secondary  . CKD (chronic kidney disease)   . DM2 (diabetes mellitus, type 2) ([place])   . HTN (hypertension)    unspec  . Nonischemic cardiomyopathy ([place])    a. EF 40-50% by most recent 2-D echo b. EF 25% by cardiac cath in 2004  . Overweight(278.02)     Past Surgical History:   Procedure Laterality Date  . COLONOSCOPY    . HERNIA REPAIR    . KNEE ARTHROPLASTY Left 08/08/2017   Procedure: LEFT TOTAL KNEE ARTHROPLASTY WITH COMPUTER NAVIGATION;  Surgeon: Rod Can, MD;  Location: [place];  Service: Orthopedics;  Laterality: Left;  Needs RNFA    Current Outpatient Medications  Medication Sig Dispense Refill Last Dose  . allopurinol (ZYLOPRIM) 300 MG tablet Take 300 mg by mouth every morning.   Taking  . aspirin 81 MG chewable tablet Chew 1 tablet (81 mg total) by mouth 2 (two) times daily. 60 tablet 1 Taking  . atorvastatin (LIPITOR) 40 MG tablet Take 1 tablet (40 mg total) by mouth daily. (Patient taking differently: Take 40 mg by mouth every evening. ) 90 tablet 3 Taking  . benazepril (LOTENSIN) 20 MG tablet Take 40 mg by mouth every morning.   Taking  . budesonide-formoterol (SYMBICORT) 160-4.5 MCG/ACT inhaler Inhale 1 puff into the lungs daily. Pay take 2nd dose in the evening if needed   Taking  . bumetanide (BUMEX) 1 MG tablet Take 2 mg by mouth every morning.   Taking  . carvedilol (COREG) 25 MG tablet Take 1 tablet (25 mg total) by mouth 2 (two) times daily. 180 tablet 3 Taking  . docusate sodium (COLACE) 100 MG capsule Take 1 capsule (100 mg total) by mouth 2 (two) times daily. 60 capsule 1 Taking  . HYDROcodone-acetaminophen (NORCO/VICODIN) 5-325 MG tablet Take 1-2 tablets by mouth every 4 (four) hours as needed (breakthrough pain). Do not exceed 10 tablets per day 60 tablet 0 Taking  . insulin aspart (FIASP) 100  UNIT/ML injection Inject into the skin See admin instructions. Sliding scale at least every morning patient waiting on clarification from PCP on how he is supposed to take medication.   Taking  . ondansetron (ZOFRAN) 4 MG tablet Take 1 tablet (4 mg total) by mouth every 6 (six) hours as needed for nausea. 20 tablet 0 Taking  . senna (SENOKOT) 8.6 MG TABS tablet Take 2 tablets (17.2 mg total) by mouth at bedtime. 120 each 0 Taking   No current  facility-administered medications for this visit.    No Known Allergies  Social History   Tobacco Use  . Smoking status: Former Smoker    Packs/day: 0.50    Years: 20.00    Pack years: 10.00    Types: Cigarettes    Start date: 02/12/1966    Last attempt to quit: 11/08/1984    Years since quitting: 33.0  . Smokeless tobacco: Never Used  Substance Use Topics  . Alcohol use: No    Alcohol/week: 0.0 oz    Family History  Problem Relation Age of Onset  . Hypertension Mother      Review of Systems  Constitutional: Negative.   HENT: Negative.   Eyes: Negative.   Respiratory: Positive for cough and wheezing.   Cardiovascular: Negative.   Gastrointestinal: Negative.   Genitourinary: Negative.   Musculoskeletal: Positive for joint pain.  Skin: Negative.   Neurological: Negative.   Endo/Heme/Allergies: Negative.   Psychiatric/Behavioral: Negative.     Objective:  Physical Exam  Vitals reviewed. Constitutional: He is oriented to person, place, and time. He appears well-developed and well-nourished.  HENT:  Head: Normocephalic and atraumatic.  Eyes: Conjunctivae and EOM are normal. Pupils are equal, round, and reactive to light.  Neck: Normal range of motion. Neck supple.  Cardiovascular: Normal rate, regular rhythm and intact distal pulses.  Respiratory: Effort normal. No respiratory distress.  GI: Soft. He exhibits no distension.  Genitourinary:  Genitourinary Comments: deferred  Musculoskeletal:       Right knee: He exhibits decreased range of motion, swelling and abnormal alignment. Tenderness found. Medial joint line and lateral joint line tenderness noted.  Neurological: He is alert and oriented to person, place, and time. He has normal reflexes.  Skin: Skin is warm and dry.  Psychiatric: He has a normal mood and affect. His behavior is normal. Judgment and thought content normal.    Vital signs in last 24 hours: @VSRANGES @  Labs:   Estimated body mass index is  35.44 kg/m as calculated from the following:   Height as of 10/10/17: 6\' 2"  (1.88 m).   Weight as of 10/10/17: 125.2 kg (276 lb).   Imaging Review Plain radiographs demonstrate severe degenerative joint disease of the right knee(s). The overall alignment issignificant varus. The bone quality appears to be adequate for age and reported activity level.  Assessment/Plan:  End stage arthritis, right knee   The patient history, physical examination, clinical judgment of the provider and imaging studies are consistent with end stage degenerative joint disease of the right knee(s) and total knee arthroplasty is deemed medically necessary. The treatment options including medical management, injection therapy arthroscopy and arthroplasty were discussed at length. The risks and benefits of total knee arthroplasty were presented and reviewed. The risks due to aseptic loosening, infection, stiffness, patella tracking problems, thromboembolic complications and other imponderables were discussed. The patient acknowledged the explanation, agreed to proceed with the plan and consent was signed. Patient is being admitted for inpatient treatment for surgery, pain control,  PT, OT, prophylactic antibiotics, VTE prophylaxis, progressive ambulation and ADL's and discharge planning. The patient is planning to be discharged home with outpatient PT (ACI)

## 2017-11-02 NOTE — H&P (Signed)
TOTAL KNEE ADMISSION H&P  Patient is being admitted for right total knee arthroplasty.  Subjective:  Chief Complaint:right knee pain.  HPI: Todd Robertson, 71 y.o. male, has a history of pain and functional disability in the right knee due to arthritis and has failed non-surgical conservative treatments for greater than 12 weeks to includeNSAID's and/or analgesics, corticosteriod injections, flexibility and strengthening excercises, use of assistive devices, weight reduction as appropriate and activity modification.  Onset of symptoms was gradual, starting 5 years ago with gradually worsening course since that time. The patient noted no past surgery on the right knee(s).  Patient currently rates pain in the right knee(s) at 10 out of 10 with activity. Patient has night pain, worsening of pain with activity and weight bearing, pain that interferes with activities of daily living, pain with passive range of motion, crepitus and joint swelling.  Patient has evidence of subchondral cysts, subchondral sclerosis, periarticular osteophytes and joint space narrowing by imaging studies.  There is no active infection.  Patient Active Problem List   Diagnosis Date Noted  . Osteoarthritis of left knee 08/08/2017  . CKD (chronic kidney disease) stage 3, GFR 30-59 ml/min (HCC) 08/08/2017  . Degenerative arthritis of left knee 08/08/2017  . NSVT (nonsustained ventricular tachycardia) ([place])   . DM 12/26/2009  . OVERWEIGHT/OBESITY 08/08/2009  . HTN (hypertension) 08/08/2009  . CARDIOMYOPATHY, SECONDARY 08/08/2009   Past Medical History:  Diagnosis Date  . Arthritis   . Cancer [facility])    prostate  . Cardiomyopathy    secondary  . CKD (chronic kidney disease)   . DM2 (diabetes mellitus, type 2) ([place])   . HTN (hypertension)    unspec  . Nonischemic cardiomyopathy ([place])    a. EF 40-50% by most recent 2-D echo b. EF 25% by cardiac cath in 2004  . Overweight(278.02)     Past Surgical History:   Procedure Laterality Date  . COLONOSCOPY    . HERNIA REPAIR    . KNEE ARTHROPLASTY Left 08/08/2017   Procedure: LEFT TOTAL KNEE ARTHROPLASTY WITH COMPUTER NAVIGATION;  Surgeon: Rod Can, MD;  Location: [place];  Service: Orthopedics;  Laterality: Left;  Needs RNFA    Current Outpatient Medications  Medication Sig Dispense Refill Last Dose  . allopurinol (ZYLOPRIM) 300 MG tablet Take 300 mg by mouth every morning.   Taking  . aspirin 81 MG chewable tablet Chew 1 tablet (81 mg total) by mouth 2 (two) times daily. 60 tablet 1 Taking  . atorvastatin (LIPITOR) 40 MG tablet Take 1 tablet (40 mg total) by mouth daily. (Patient taking differently: Take 40 mg by mouth every evening. ) 90 tablet 3 Taking  . benazepril (LOTENSIN) 20 MG tablet Take 40 mg by mouth every morning.   Taking  . budesonide-formoterol (SYMBICORT) 160-4.5 MCG/ACT inhaler Inhale 1 puff into the lungs daily. Pay take 2nd dose in the evening if needed   Taking  . bumetanide (BUMEX) 1 MG tablet Take 2 mg by mouth every morning.   Taking  . carvedilol (COREG) 25 MG tablet Take 1 tablet (25 mg total) by mouth 2 (two) times daily. 180 tablet 3 Taking  . docusate sodium (COLACE) 100 MG capsule Take 1 capsule (100 mg total) by mouth 2 (two) times daily. 60 capsule 1 Taking  . HYDROcodone-acetaminophen (NORCO/VICODIN) 5-325 MG tablet Take 1-2 tablets by mouth every 4 (four) hours as needed (breakthrough pain). Do not exceed 10 tablets per day 60 tablet 0 Taking  . insulin aspart (FIASP) 100  UNIT/ML injection Inject into the skin See admin instructions. Sliding scale at least every morning patient waiting on clarification from PCP on how he is supposed to take medication.   Taking  . ondansetron (ZOFRAN) 4 MG tablet Take 1 tablet (4 mg total) by mouth every 6 (six) hours as needed for nausea. 20 tablet 0 Taking  . senna (SENOKOT) 8.6 MG TABS tablet Take 2 tablets (17.2 mg total) by mouth at bedtime. 120 each 0 Taking   No current  facility-administered medications for this visit.    No Known Allergies  Social History   Tobacco Use  . Smoking status: Former Smoker    Packs/day: 0.50    Years: 20.00    Pack years: 10.00    Types: Cigarettes    Start date: 02/12/1966    Last attempt to quit: 11/08/1984    Years since quitting: 33.0  . Smokeless tobacco: Never Used  Substance Use Topics  . Alcohol use: No    Alcohol/week: 0.0 oz    Family History  Problem Relation Age of Onset  . Hypertension Mother      Review of Systems  Constitutional: Negative.   HENT: Negative.   Eyes: Negative.   Respiratory: Positive for cough and wheezing.   Cardiovascular: Negative.   Gastrointestinal: Negative.   Genitourinary: Negative.   Musculoskeletal: Positive for joint pain.  Skin: Negative.   Neurological: Negative.   Endo/Heme/Allergies: Negative.   Psychiatric/Behavioral: Negative.     Objective:  Physical Exam  Vitals reviewed. Constitutional: He is oriented to person, place, and time. He appears well-developed and well-nourished.  HENT:  Head: Normocephalic and atraumatic.  Eyes: Conjunctivae and EOM are normal. Pupils are equal, round, and reactive to light.  Neck: Normal range of motion. Neck supple.  Cardiovascular: Normal rate, regular rhythm and intact distal pulses.  Respiratory: Effort normal. No respiratory distress.  GI: Soft. He exhibits no distension.  Genitourinary:  Genitourinary Comments: deferred  Musculoskeletal:       Right knee: He exhibits decreased range of motion, swelling and abnormal alignment. Tenderness found. Medial joint line and lateral joint line tenderness noted.  Neurological: He is alert and oriented to person, place, and time. He has normal reflexes.  Skin: Skin is warm and dry.  Psychiatric: He has a normal mood and affect. His behavior is normal. Judgment and thought content normal.    Vital signs in last 24 hours: @VSRANGES @  Labs:   Estimated body mass index is  35.44 kg/m as calculated from the following:   Height as of 10/10/17: 6\' 2"  (1.88 m).   Weight as of 10/10/17: 125.2 kg (276 lb).   Imaging Review Plain radiographs demonstrate severe degenerative joint disease of the right knee(s). The overall alignment issignificant varus. The bone quality appears to be adequate for age and reported activity level.  Assessment/Plan:  End stage arthritis, right knee   The patient history, physical examination, clinical judgment of the provider and imaging studies are consistent with end stage degenerative joint disease of the right knee(s) and total knee arthroplasty is deemed medically necessary. The treatment options including medical management, injection therapy arthroscopy and arthroplasty were discussed at length. The risks and benefits of total knee arthroplasty were presented and reviewed. The risks due to aseptic loosening, infection, stiffness, patella tracking problems, thromboembolic complications and other imponderables were discussed. The patient acknowledged the explanation, agreed to proceed with the plan and consent was signed. Patient is being admitted for inpatient treatment for surgery, pain control,  PT, OT, prophylactic antibiotics, VTE prophylaxis, progressive ambulation and ADL's and discharge planning. The patient is planning to be discharged home with outpatient PT (ACI)

## 2017-11-10 ENCOUNTER — Other Ambulatory Visit (HOSPITAL_COMMUNITY): Payer: Self-pay | Admitting: Emergency Medicine

## 2017-11-10 NOTE — Patient Instructions (Signed)
Todd Robertson  11/10/2017   Your procedure is scheduled on: 11-17-17  Report to [facility] Main  Entrance   Follow signs to Short Stay on first floor at 5:30AM    Call this number if you have problems the morning of surgery [phone]   Remember: ONLY 1 PERSON MAY GO WITH YOU TO SHORT STAY TO GET  READY MORNING OF Todd Robertson.    Do not eat food or drink liquids :After Midnight.     Take these medicines the morning of surgery with A SIP OF WATER: allopurinol, carvedilol, inhaler if needed (may bring to hospital)                                 You may not have any metal on your body including hair pins and              piercings  Do not wear jewelry, make-up, lotions, powders or perfumes, deodorant                  Men may shave face and neck.   Do not bring valuables to the hospital. [place].  Contacts, dentures or bridgework may not be worn into surgery.  Leave suitcase in the car. After surgery it may be brought to your room.                Please read over the following fact sheets you were given: _____________________________________________________________________            How to Manage Your Diabetes Before and After Surgery  Why is it important to control my blood sugar before and after surgery? . Improving blood sugar levels before and after surgery helps healing and can limit problems. . A way of improving blood sugar control is eating a healthy diet by: o  Eating less sugar and carbohydrates o  Increasing activity/exercise o  Talking with your doctor about reaching your blood sugar goals . High blood sugars (greater than 180 mg/dL) can raise your risk of infections and slow your recovery, so you will need to focus on controlling your diabetes during the weeks before surgery. . Make sure that the doctor who takes care of your diabetes knows about your planned surgery including the  date and location.  How do I manage my blood sugar before surgery? . Check your blood sugar at least 4 times a day, starting 2 days before surgery, to make sure that the level is not too high or low. o Check your blood sugar the morning of your surgery when you wake up and every 2 hours until you get to the Short Stay unit. . If your blood sugar is less than 70 mg/dL, you will need to treat for low blood sugar: o Do not take insulin. o Treat a low blood sugar (less than 70 mg/dL) with  cup of clear juice (cranberry or apple), 4 glucose tablets, OR glucose gel. o Recheck blood sugar in 15 minutes after treatment (to make sure it is greater than 70 mg/dL). If your blood sugar is not greater than 70 mg/dL on recheck, call [phone] for further instructions. . Report your blood sugar to the short stay nurse when you get to  Short Stay.  . If you are admitted to the hospital after surgery: o Your blood sugar will be checked by the staff and you will probably be given insulin after surgery (instead of oral diabetes medicines) to make sure you have good blood sugar levels. o The goal for blood sugar control after surgery is 80-180 mg/dL.   WHAT DO I DO ABOUT MY DIABETES MEDICATION?   . THE DAY BEFORE SURGERY, take  USUAL DOSES OF SLIDING SCALE INSULIN ASPART (FIASP) for breakfast and lunch coverage. DO NOT TAKE DINNER/BEDTIME DOSE!      . THE MORNING OF SURGERY, If your CBG(BLOOD SUGAR) is greater than 220 mg/dL, you may take  of your sliding scale (correction) dose of INSULIN ASPART (FIASP).   Patient Signature:  Date:   Nurse Signature:  Date:   Reviewed and Endorsed by [facility] Patient Education Committee, August 2015   [facility] - Preparing for Surgery Before surgery, you can play an important role.  Because skin is not sterile, your skin needs to be as free of germs as possible.  You can reduce the number of germs on your skin by washing with CHG (chlorahexidine gluconate)  soap before surgery.  CHG is an antiseptic cleaner which kills germs and bonds with the skin to continue killing germs even after washing. Please DO NOT use if you have an allergy to CHG or antibacterial soaps.  If your skin becomes reddened/irritated stop using the CHG and inform your nurse when you arrive at Short Stay. Do not shave (including legs and underarms) for at least 48 hours prior to the first CHG shower.  You may shave your face/neck. Please follow these instructions carefully:  1.  Shower with CHG Soap the night before surgery and the  morning of Surgery.  2.  If you choose to wash your hair, wash your hair first as usual with your  normal  shampoo.  3.  After you shampoo, rinse your hair and body thoroughly to remove the  shampoo.                           4.  Use CHG as you would any other liquid soap.  You can apply chg directly  to the skin and wash                       Gently with a scrungie or clean washcloth.  5.  Apply the CHG Soap to your body ONLY FROM THE NECK DOWN.   Do not use on face/ open                           Wound or open sores. Avoid contact with eyes, ears mouth and genitals (private parts).                       Wash face,  Genitals (private parts) with your normal soap.             6.  Wash thoroughly, paying special attention to the area where your surgery  will be performed.  7.  Thoroughly rinse your body with warm water from the neck down.  8.  DO NOT shower/wash with your normal soap after using and rinsing off  the CHG Soap.                9.  Todd Robertson  yourself dry with a clean towel.            10.  Wear clean pajamas.            11.  Place clean sheets on your bed the night of your first shower and do not  sleep with pets. Day of Surgery : Do not apply any lotions/deodorants the morning of surgery.  Please wear clean clothes to the hospital/surgery center.  FAILURE TO FOLLOW THESE INSTRUCTIONS MAY RESULT IN THE CANCELLATION OF YOUR SURGERY PATIENT  SIGNATURE_________________________________  NURSE SIGNATURE__________________________________  ________________________________________________________________________   Todd Robertson  An incentive spirometer is a tool that can help keep your lungs clear and active. This tool measures how well you are filling your lungs with each breath. Taking long deep breaths may help reverse or decrease the chance of developing breathing (pulmonary) problems (especially infection) following:  A long period of time when you are unable to move or be active. BEFORE THE PROCEDURE   If the spirometer includes an indicator to show your best effort, your nurse or respiratory therapist will set it to a desired goal.  If possible, sit up straight or lean slightly forward. Try not to slouch.  Hold the incentive spirometer in an upright position. INSTRUCTIONS FOR USE  1. Sit on the edge of your bed if possible, or sit up as far as you can in bed or on a chair. 2. Hold the incentive spirometer in an upright position. 3. Breathe out normally. 4. Place the mouthpiece in your mouth and seal your lips tightly around it. 5. Breathe in slowly and as deeply as possible, raising the piston or the ball toward the top of the column. 6. Hold your breath for 3-5 seconds or for as long as possible. Allow the piston or ball to fall to the bottom of the column. 7. Remove the mouthpiece from your mouth and breathe out normally. 8. Rest for a few seconds and repeat Steps 1 through 7 at least 10 times every 1-2 hours when you are awake. Take your time and take a few normal breaths between deep breaths. 9. The spirometer may include an indicator to show your best effort. Use the indicator as a goal to work toward during each repetition. 10. After each set of 10 deep breaths, practice coughing to be sure your lungs are clear. If you have an incision (the cut made at the time of surgery), support your incision when coughing  by placing a pillow or rolled up towels firmly against it. Once you are able to get out of bed, walk around indoors and cough well. You may stop using the incentive spirometer when instructed by your caregiver.  RISKS AND COMPLICATIONS  Take your time so you do not get dizzy or light-headed.  If you are in pain, you may need to take or ask for pain medication before doing incentive spirometry. It is harder to take a deep breath if you are having pain. AFTER USE  Rest and breathe slowly and easily.  It can be helpful to keep track of a log of your progress. Your caregiver can provide you with a simple table to help with this. If you are using the spirometer at home, follow these instructions: [place] IF:   You are having difficultly using the spirometer.  You have trouble using the spirometer as often as instructed.  Your pain medication is not giving enough relief while using the spirometer.  You develop fever of  100.5 F (38.1 C) or higher. SEEK IMMEDIATE MEDICAL CARE IF:   You cough up bloody sputum that had not been present before.  You develop fever of 102 F (38.9 C) or greater.  You develop worsening pain at or near the incision site. MAKE SURE YOU:   Understand these instructions.  Will watch your condition.  Will get help right away if you are not doing well or get worse. Document Released: 03/07/2007 Document Revised: 01/17/2012 Document Reviewed: 05/08/2007 ExitCare Patient Information 2014 ExitCare, [place].   ________________________________________________________________________  WHAT IS A BLOOD TRANSFUSION? Blood Transfusion Information  A transfusion is the replacement of blood or some of its parts. Blood is made up of multiple cells which provide different functions.  Red blood cells carry oxygen and are used for blood loss replacement.  White blood cells fight against infection.  Platelets control bleeding.  Plasma helps clot  blood.  Other blood products are available for specialized needs, such as hemophilia or other clotting disorders. BEFORE THE TRANSFUSION  Who gives blood for transfusions?   Healthy volunteers who are fully evaluated to make sure their blood is safe. This is blood bank blood. Transfusion therapy is the safest it has ever been in the practice of medicine. Before blood is taken from a donor, a complete history is taken to make sure that person has no history of diseases nor engages in risky social behavior (examples are intravenous drug use or sexual activity with multiple partners). The donor's travel history is screened to minimize risk of transmitting infections, such as malaria. The donated blood is tested for signs of infectious diseases, such as HIV and hepatitis. The blood is then tested to be sure it is compatible with you in order to minimize the chance of a transfusion reaction. If you or a relative donates blood, this is often done in anticipation of surgery and is not appropriate for emergency situations. It takes many days to process the donated blood. RISKS AND COMPLICATIONS Although transfusion therapy is very safe and saves many lives, the main dangers of transfusion include:   Getting an infectious disease.  Developing a transfusion reaction. This is an allergic reaction to something in the blood you were given. Every precaution is taken to prevent this. The decision to have a blood transfusion has been considered carefully by your caregiver before blood is given. Blood is not given unless the benefits outweigh the risks. AFTER THE TRANSFUSION  Right after receiving a blood transfusion, you will usually feel much better and more energetic. This is especially true if your red blood cells have gotten low (anemic). The transfusion raises the level of the red blood cells which carry oxygen, and this usually causes an energy increase.  The nurse administering the transfusion will monitor  you carefully for complications. HOME CARE INSTRUCTIONS  No special instructions are needed after a transfusion. You may find your energy is better. Speak with your caregiver about any limitations on activity for underlying diseases you may have. SEEK MEDICAL CARE IF:   Your condition is not improving after your transfusion.  You develop redness or irritation at the intravenous (IV) site. SEEK IMMEDIATE MEDICAL CARE IF:  Any of the following symptoms occur over the next 12 hours:  Shaking chills.  You have a temperature by mouth above 102 F (38.9 C), not controlled by medicine.  Chest, back, or muscle pain.  People around you feel you are not acting correctly or are confused.  Shortness of breath or difficulty  breathing.  Dizziness and fainting.  You get a rash or develop hives.  You have a decrease in urine output.  Your urine turns a dark color or changes to pink, red, or brown. Any of the following symptoms occur over the next 10 days:  You have a temperature by mouth above 102 F (38.9 C), not controlled by medicine.  Shortness of breath.  Weakness after normal activity.  The white part of the eye turns yellow (jaundice).  You have a decrease in the amount of urine or are urinating less often.  Your urine turns a dark color or changes to pink, red, or brown. Document Released: 10/22/2000 Document Revised: 01/17/2012 Document Reviewed: 06/10/2008 [facility] Patient Information 2014 [place], [place].  _______________________________________________________________________

## 2017-11-10 NOTE — Progress Notes (Signed)
[place] cardiology Dr Carlyle Dolly 10-10-17 epic  ECHO 08-09-17 epic  EKG 08-08-17 epic

## 2017-11-11 ENCOUNTER — Encounter (HOSPITAL_COMMUNITY): Payer: Self-pay

## 2017-11-11 ENCOUNTER — Other Ambulatory Visit: Payer: Self-pay

## 2017-11-11 ENCOUNTER — Encounter (HOSPITAL_COMMUNITY)
Admission: RE | Admit: 2017-11-11 | Discharge: 2017-11-11 | Disposition: A | Discharge status: Home / Self Care | Payer: Medicare Other | Source: Ambulatory Visit | Attending: Orthopedic Surgery | Admitting: Orthopedic Surgery

## 2017-11-11 DIAGNOSIS — Z01812 Encounter for preprocedural laboratory examination: Secondary | ICD-10-CM | POA: Insufficient documentation

## 2017-11-11 DIAGNOSIS — I129 Hypertensive chronic kidney disease with stage 1 through stage 4 chronic kidney disease, or unspecified chronic kidney disease: Secondary | ICD-10-CM | POA: Diagnosis not present

## 2017-11-11 DIAGNOSIS — E1122 Type 2 diabetes mellitus with diabetic chronic kidney disease: Secondary | ICD-10-CM | POA: Insufficient documentation

## 2017-11-11 DIAGNOSIS — N189 Chronic kidney disease, unspecified: Secondary | ICD-10-CM | POA: Insufficient documentation

## 2017-11-11 LAB — BASIC METABOLIC PANEL
Anion gap: 5 (ref 5–15)
BUN: 24 mg/dL — AB (ref 6–20)
CALCIUM: 8.9 mg/dL (ref 8.9–10.3)
CHLORIDE: 109 mmol/L (ref 101–111)
CO2: 28 mmol/L (ref 22–32)
CREATININE: 1.81 mg/dL — AB (ref 0.61–1.24)
GFR calc non Af Amer: 36 mL/min — ABNORMAL LOW (ref >60)
GFR, EST AFRICAN AMERICAN: 42 mL/min — AB (ref >60)
GLUCOSE: 88 mg/dL (ref 65–99)
Potassium: 3.8 mmol/L (ref 3.5–5.1)
Sodium: 142 mmol/L (ref 135–145)

## 2017-11-11 LAB — CBC
HEMATOCRIT: 36.9 % — AB (ref 39.0–52.0)
HEMOGLOBIN: 11.6 g/dL — AB (ref 13.0–17.0)
MCH: 28.9 pg (ref 26.0–34.0)
MCHC: 31.4 g/dL (ref 30.0–36.0)
MCV: 92 fL (ref 78.0–100.0)
Platelets: 202 10*3/uL (ref 150–400)
RBC: 4.01 MIL/uL — ABNORMAL LOW (ref 4.22–5.81)
RDW: 14.4 % (ref 11.5–15.5)
WBC: 6.5 10*3/uL (ref 4.0–10.5)

## 2017-11-11 LAB — GLUCOSE, CAPILLARY: Glucose-Capillary: 87 mg/dL (ref 65–99)

## 2017-11-11 LAB — HEMOGLOBIN A1C
Hgb A1c MFr Bld: 5.8 % — ABNORMAL HIGH (ref 4.8–5.6)
MEAN PLASMA GLUCOSE: 119.76 mg/dL

## 2017-11-11 LAB — SURGICAL PCR SCREEN
MRSA, PCR: NEGATIVE
Staphylococcus aureus: NEGATIVE

## 2017-11-11 NOTE — Progress Notes (Signed)
   11/11/17 1422  OBSTRUCTIVE SLEEP APNEA  Have you ever been diagnosed with sleep apnea through a sleep study? No  Do you snore loudly (loud enough to be heard through closed doors)?  0  Do you often feel tired, fatigued, or sleepy during the daytime (such as falling asleep during driving or talking to someone)? 0  Has anyone observed you stop breathing during your sleep? 0  Do you have, or are you being treated for high blood pressure? 1  BMI more than 35 kg/m2? 1  Age > 50 (1-yes) 1  Neck circumference greater than:Male 16 inches or larger, Male 17inches or larger? 1  Male Gender (Yes=1) 1  Obstructive Sleep Apnea Score 5

## 2017-11-12 LAB — ABO/RH: ABO/RH(D): O POS

## 2017-11-16 MED ORDER — TRANEXAMIC ACID 1000 MG/10ML IV SOLN
1000.0000 mg | INTRAVENOUS | Status: AC
  Administered 2017-11-17: 1000 mg via INTRAVENOUS
  Filled 2017-11-16: qty 1100

## 2017-11-16 MED ORDER — DEXTROSE 5 % IV SOLN
3.0000 g | INTRAVENOUS | Status: AC
  Administered 2017-11-17: 3 g via INTRAVENOUS
  Filled 2017-11-16 (×2): qty 3000

## 2017-11-16 NOTE — Anesthesia Preprocedure Evaluation (Addendum)
Anesthesia Evaluation  Patient identified by MRN, date of birth, ID band Patient awake    Reviewed: Allergy & Precautions, NPO status , Patient's Chart, lab work & pertinent test results, reviewed documented beta blocker date and time   Airway Mallampati: III  TM Distance: >3 FB Neck ROM: Full    Dental   Pulmonary former smoker,    breath sounds clear to auscultation       Cardiovascular hypertension, Pt. on medications and Pt. on home beta blockers + dysrhythmias  Rhythm:Regular Rate:Normal  08/2017: Study Conclusions - Left ventricle: The cavity size was normal. Wall thickness was  increased in a pattern of moderate LVH. Systolic function was  normal. The estimated ejection fraction was in the range of 55%  to 60%. Wall motion was normal; there were no regional wall  motion abnormalities. Doppler parameters are consistent with  abnormal left ventricular relaxation (grade 1 diastolic  dysfunction).- Mitral valve: There was mild regurgitation.- Right atrium: The atrium was mildly dilated.  Hx Cardiomyopathy with normalization of EF   Neuro/Psych negative neurological ROS     GI/Hepatic negative GI ROS, Neg liver ROS,   Endo/Other  diabetes, Type 2, Insulin Dependent  Renal/GU Renal InsufficiencyRenal disease     Musculoskeletal   Abdominal   Peds  Hematology  (+) anemia ,   Anesthesia Other Findings   Reproductive/Obstetrics                            Lab Results  Component Value Date   CREATININE 1.81 (H) 11/11/2017   BUN 24 (H) 11/11/2017   NA 142 11/11/2017   K 3.8 11/11/2017   CL 109 11/11/2017   CO2 28 11/11/2017   Lab Results  Component Value Date   WBC 6.5 11/11/2017   HGB 11.6 (L) 11/11/2017   HCT 36.9 (L) 11/11/2017   MCV 92.0 11/11/2017   PLT 202 11/11/2017   No results found for: INR, PROTIME  Anesthesia Physical Anesthesia Plan  ASA: III  Anesthesia Plan: Spinal    Post-op Pain Management:  Regional for Post-op pain   Induction: Intravenous  PONV Risk Score and Plan: 2 and Ondansetron, Propofol infusion and Treatment may vary due to age or medical condition  Airway Management Planned: Natural Airway and Simple Face Mask  Additional Equipment:   Intra-op Plan:   Post-operative Plan:   Informed Consent: I have reviewed the patients History and Physical, chart, labs and discussed the procedure including the risks, benefits and alternatives for the proposed anesthesia with the patient or authorized representative who has indicated his/her understanding and acceptance.     Plan Discussed with: CRNA  Anesthesia Plan Comments:        Anesthesia Quick Evaluation

## 2017-11-17 ENCOUNTER — Inpatient Hospital Stay (HOSPITAL_COMMUNITY)
Admission: RE | Admit: 2017-11-17 | Discharge: 2017-11-19 | DRG: 470 | Disposition: A | Discharge status: Home / Self Care | Payer: Medicare Other | Source: Ambulatory Visit | Attending: Orthopedic Surgery | Admitting: Orthopedic Surgery

## 2017-11-17 ENCOUNTER — Encounter (HOSPITAL_COMMUNITY): Payer: Self-pay | Admitting: *Deleted

## 2017-11-17 ENCOUNTER — Inpatient Hospital Stay (HOSPITAL_COMMUNITY): Payer: Medicare Other

## 2017-11-17 ENCOUNTER — Encounter (HOSPITAL_COMMUNITY)
Admission: RE | Disposition: A | Discharge status: Home / Self Care | Payer: Self-pay | Source: Ambulatory Visit | Attending: Orthopedic Surgery

## 2017-11-17 ENCOUNTER — Inpatient Hospital Stay (HOSPITAL_COMMUNITY): Payer: Medicare Other | Admitting: Certified Registered Nurse Anesthetist

## 2017-11-17 ENCOUNTER — Other Ambulatory Visit: Payer: Self-pay

## 2017-11-17 DIAGNOSIS — Z471 Aftercare following joint replacement surgery: Secondary | ICD-10-CM | POA: Diagnosis not present

## 2017-11-17 DIAGNOSIS — R402414 Glasgow coma scale score 13-15, 24 hours or more after hospital admission: Secondary | ICD-10-CM | POA: Diagnosis not present

## 2017-11-17 DIAGNOSIS — Z7951 Long term (current) use of inhaled steroids: Secondary | ICD-10-CM

## 2017-11-17 DIAGNOSIS — Z794 Long term (current) use of insulin: Secondary | ICD-10-CM

## 2017-11-17 DIAGNOSIS — E1122 Type 2 diabetes mellitus with diabetic chronic kidney disease: Secondary | ICD-10-CM | POA: Diagnosis not present

## 2017-11-17 DIAGNOSIS — Z7982 Long term (current) use of aspirin: Secondary | ICD-10-CM | POA: Diagnosis not present

## 2017-11-17 DIAGNOSIS — Z96651 Presence of right artificial knee joint: Secondary | ICD-10-CM | POA: Diagnosis not present

## 2017-11-17 DIAGNOSIS — M1711 Unilateral primary osteoarthritis, right knee: Secondary | ICD-10-CM | POA: Diagnosis not present

## 2017-11-17 DIAGNOSIS — I428 Other cardiomyopathies: Secondary | ICD-10-CM | POA: Diagnosis not present

## 2017-11-17 DIAGNOSIS — Z96652 Presence of left artificial knee joint: Secondary | ICD-10-CM | POA: Diagnosis present

## 2017-11-17 DIAGNOSIS — Z8679 Personal history of other diseases of the circulatory system: Secondary | ICD-10-CM | POA: Diagnosis not present

## 2017-11-17 DIAGNOSIS — Z87891 Personal history of nicotine dependence: Secondary | ICD-10-CM

## 2017-11-17 DIAGNOSIS — G8918 Other acute postprocedural pain: Secondary | ICD-10-CM | POA: Diagnosis not present

## 2017-11-17 DIAGNOSIS — Z79899 Other long term (current) drug therapy: Secondary | ICD-10-CM | POA: Diagnosis not present

## 2017-11-17 DIAGNOSIS — I129 Hypertensive chronic kidney disease with stage 1 through stage 4 chronic kidney disease, or unspecified chronic kidney disease: Secondary | ICD-10-CM | POA: Diagnosis not present

## 2017-11-17 DIAGNOSIS — N183 Chronic kidney disease, stage 3 (moderate): Secondary | ICD-10-CM | POA: Diagnosis not present

## 2017-11-17 DIAGNOSIS — D631 Anemia in chronic kidney disease: Secondary | ICD-10-CM | POA: Diagnosis not present

## 2017-11-17 HISTORY — PX: KNEE ARTHROPLASTY: SHX992

## 2017-11-17 LAB — TYPE AND SCREEN
ABO/RH(D): O POS
Antibody Screen: NEGATIVE

## 2017-11-17 LAB — GLUCOSE, CAPILLARY
GLUCOSE-CAPILLARY: 134 mg/dL — AB (ref 65–99)
GLUCOSE-CAPILLARY: 86 mg/dL (ref 65–99)
GLUCOSE-CAPILLARY: 109 mg/dL — AB (ref 65–99)
Glucose-Capillary: 105 mg/dL — ABNORMAL HIGH (ref 65–99)

## 2017-11-17 SURGERY — ARTHROPLASTY, KNEE, TOTAL, USING IMAGELESS COMPUTER-ASSISTED NAVIGATION
Anesthesia: Spinal | Site: Knee | Laterality: Right

## 2017-11-17 MED ORDER — SORBITOL 70 % SOLN
30.0000 mL | Freq: Every day | Status: DC | PRN
  Filled 2017-11-17: qty 30

## 2017-11-17 MED ORDER — SODIUM CHLORIDE 0.9 % IV SOLN
INTRAVENOUS | Status: DC
  Administered 2017-11-17 (×2): via INTRAVENOUS

## 2017-11-17 MED ORDER — LORATADINE 10 MG PO TABS
10.0000 mg | ORAL_TABLET | Freq: Every day | ORAL | Status: DC
  Administered 2017-11-17 – 2017-11-19 (×3): 10 mg via ORAL
  Filled 2017-11-17 (×3): qty 1

## 2017-11-17 MED ORDER — LACTATED RINGERS IV SOLN
INTRAVENOUS | Status: DC | PRN
Start: 2017-11-17 — End: 2017-11-17
  Administered 2017-11-17 (×2): via INTRAVENOUS

## 2017-11-17 MED ORDER — KETOROLAC TROMETHAMINE 15 MG/ML IJ SOLN
15.0000 mg | Freq: Four times a day (QID) | INTRAMUSCULAR | Status: AC
  Administered 2017-11-17 – 2017-11-18 (×4): 15 mg via INTRAVENOUS
  Filled 2017-11-17 (×4): qty 1

## 2017-11-17 MED ORDER — PHENYLEPHRINE HCL 10 MG/ML IJ SOLN
INTRAVENOUS | Status: DC | PRN
  Administered 2017-11-17: 50 ug/min via INTRAVENOUS

## 2017-11-17 MED ORDER — CEFAZOLIN SODIUM-DEXTROSE 2-4 GM/100ML-% IV SOLN
2.0000 g | Freq: Four times a day (QID) | INTRAVENOUS | Status: AC
  Administered 2017-11-17 (×2): 2 g via INTRAVENOUS
  Filled 2017-11-17 (×2): qty 100

## 2017-11-17 MED ORDER — SODIUM CHLORIDE 0.9 % IR SOLN
Status: DC | PRN
  Administered 2017-11-17: 1000 mL

## 2017-11-17 MED ORDER — POLYETHYLENE GLYCOL 3350 17 G PO PACK: 17.0000 g | PACK | Freq: Every day | ORAL | Status: DC | PRN

## 2017-11-17 MED ORDER — ALLOPURINOL 300 MG PO TABS
300.0000 mg | ORAL_TABLET | Freq: Every day | ORAL | Status: DC
  Administered 2017-11-18 – 2017-11-19 (×2): 300 mg via ORAL
  Filled 2017-11-17 (×2): qty 1

## 2017-11-17 MED ORDER — ONDANSETRON HCL 4 MG/2ML IJ SOLN: 4.0000 mg | Freq: Four times a day (QID) | INTRAMUSCULAR | Status: DC | PRN

## 2017-11-17 MED ORDER — BUPIVACAINE IN DEXTROSE 0.75-8.25 % IT SOLN
INTRATHECAL | Status: DC | PRN
  Administered 2017-11-17: 2 mL via INTRATHECAL

## 2017-11-17 MED ORDER — ONDANSETRON HCL 4 MG/2ML IJ SOLN
INTRAMUSCULAR | Status: AC
  Filled 2017-11-17: qty 2

## 2017-11-17 MED ORDER — MIDAZOLAM HCL 5 MG/5ML IJ SOLN
INTRAMUSCULAR | Status: DC | PRN
  Administered 2017-11-17 (×2): 1 mg via INTRAVENOUS

## 2017-11-17 MED ORDER — SODIUM CHLORIDE 0.9 % IJ SOLN
INTRAMUSCULAR | Status: AC
  Filled 2017-11-17: qty 50

## 2017-11-17 MED ORDER — DEXAMETHASONE SODIUM PHOSPHATE 10 MG/ML IJ SOLN
10.0000 mg | Freq: Once | INTRAMUSCULAR | Status: AC
  Administered 2017-11-18: 10 mg via INTRAVENOUS
  Filled 2017-11-17: qty 1

## 2017-11-17 MED ORDER — INSULIN ASPART 100 UNIT/ML ~~LOC~~ SOLN
0.0000 [IU] | Freq: Three times a day (TID) | SUBCUTANEOUS | Status: DC
  Administered 2017-11-19: 3 [IU] via SUBCUTANEOUS

## 2017-11-17 MED ORDER — MENTHOL 3 MG MT LOZG: 1.0000 | LOZENGE | OROMUCOSAL | Status: DC | PRN

## 2017-11-17 MED ORDER — HYDROMORPHONE HCL 1 MG/ML IJ SOLN: 0.5000 mg | INTRAMUSCULAR | Status: DC | PRN

## 2017-11-17 MED ORDER — PHENOL 1.4 % MT LIQD
1.0000 | OROMUCOSAL | Status: DC | PRN
  Filled 2017-11-17: qty 177

## 2017-11-17 MED ORDER — FENTANYL CITRATE (PF) 100 MCG/2ML IJ SOLN
INTRAMUSCULAR | Status: AC
  Filled 2017-11-17: qty 2

## 2017-11-17 MED ORDER — KETOROLAC TROMETHAMINE 30 MG/ML IJ SOLN
INTRAMUSCULAR | Status: AC
  Filled 2017-11-17: qty 1

## 2017-11-17 MED ORDER — ROPIVACAINE HCL 7.5 MG/ML IJ SOLN
INTRAMUSCULAR | Status: DC | PRN
  Administered 2017-11-17: 20 mL via PERINEURAL

## 2017-11-17 MED ORDER — PROPOFOL 10 MG/ML IV BOLUS
INTRAVENOUS | Status: AC
  Filled 2017-11-17: qty 20

## 2017-11-17 MED ORDER — BUPIVACAINE-EPINEPHRINE 0.25% -1:200000 IJ SOLN
INTRAMUSCULAR | Status: AC
  Filled 2017-11-17: qty 1

## 2017-11-17 MED ORDER — ONDANSETRON HCL 4 MG PO TABS
4.0000 mg | ORAL_TABLET | Freq: Four times a day (QID) | ORAL | Status: DC | PRN
  Administered 2017-11-18: 4 mg via ORAL
  Filled 2017-11-17: qty 1

## 2017-11-17 MED ORDER — MOMETASONE FURO-FORMOTEROL FUM 200-5 MCG/ACT IN AERO
2.0000 | INHALATION_SPRAY | Freq: Two times a day (BID) | RESPIRATORY_TRACT | Status: DC
  Filled 2017-11-17: qty 8.8

## 2017-11-17 MED ORDER — METOCLOPRAMIDE HCL 5 MG/ML IJ SOLN: 5.0000 mg | Freq: Three times a day (TID) | INTRAMUSCULAR | Status: DC | PRN

## 2017-11-17 MED ORDER — METHOCARBAMOL 1000 MG/10ML IJ SOLN
500.0000 mg | Freq: Four times a day (QID) | INTRAVENOUS | Status: DC | PRN
  Administered 2017-11-17: 500 mg via INTRAVENOUS
  Filled 2017-11-17: qty 550

## 2017-11-17 MED ORDER — PROPOFOL 10 MG/ML IV BOLUS
INTRAVENOUS | Status: AC
  Filled 2017-11-17: qty 40

## 2017-11-17 MED ORDER — PROPOFOL 500 MG/50ML IV EMUL
INTRAVENOUS | Status: DC | PRN
  Administered 2017-11-17: 50 ug/kg/min via INTRAVENOUS

## 2017-11-17 MED ORDER — BENAZEPRIL HCL 10 MG PO TABS
40.0000 mg | ORAL_TABLET | Freq: Every day | ORAL | Status: DC
  Administered 2017-11-18 – 2017-11-19 (×2): 40 mg via ORAL
  Filled 2017-11-17 (×2): qty 4

## 2017-11-17 MED ORDER — SODIUM CHLORIDE 0.9 % IJ SOLN
INTRAMUSCULAR | Status: DC | PRN
  Administered 2017-11-17: 30 mL via INTRAVENOUS

## 2017-11-17 MED ORDER — HYDROCODONE-ACETAMINOPHEN 7.5-325 MG PO TABS
2.0000 | ORAL_TABLET | ORAL | Status: DC | PRN
  Administered 2017-11-17 – 2017-11-19 (×7): 2 via ORAL
  Filled 2017-11-17 (×8): qty 2

## 2017-11-17 MED ORDER — METHOCARBAMOL 500 MG PO TABS: 500.0000 mg | ORAL_TABLET | Freq: Four times a day (QID) | ORAL | Status: DC | PRN

## 2017-11-17 MED ORDER — DOCUSATE SODIUM 100 MG PO CAPS
100.0000 mg | ORAL_CAPSULE | Freq: Two times a day (BID) | ORAL | Status: DC
  Administered 2017-11-17 – 2017-11-19 (×4): 100 mg via ORAL
  Filled 2017-11-17 (×4): qty 1

## 2017-11-17 MED ORDER — SENNA 8.6 MG PO TABS
2.0000 | ORAL_TABLET | Freq: Every day | ORAL | Status: DC
  Administered 2017-11-17 – 2017-11-18 (×2): 17.2 mg via ORAL
  Filled 2017-11-17 (×2): qty 2

## 2017-11-17 MED ORDER — HYDROCODONE-ACETAMINOPHEN 7.5-325 MG PO TABS
1.0000 | ORAL_TABLET | ORAL | Status: DC | PRN
  Administered 2017-11-17 (×2): 1 via ORAL
  Filled 2017-11-17 (×2): qty 1

## 2017-11-17 MED ORDER — PHENYLEPHRINE HCL 10 MG/ML IJ SOLN
INTRAMUSCULAR | Status: DC | PRN
  Administered 2017-11-17 (×2): 80 ug via INTRAVENOUS
  Administered 2017-11-17 (×2): 120 ug via INTRAVENOUS

## 2017-11-17 MED ORDER — ALUM & MAG HYDROXIDE-SIMETH 200-200-20 MG/5ML PO SUSP: 30.0000 mL | ORAL | Status: DC | PRN

## 2017-11-17 MED ORDER — ISOPROPYL ALCOHOL 70 % SOLN
Status: DC | PRN
  Administered 2017-11-17: 1 via TOPICAL

## 2017-11-17 MED ORDER — ONDANSETRON HCL 4 MG/2ML IJ SOLN
INTRAMUSCULAR | Status: DC | PRN
  Administered 2017-11-17: 4 mg via INTRAVENOUS

## 2017-11-17 MED ORDER — MIDAZOLAM HCL 2 MG/2ML IJ SOLN
INTRAMUSCULAR | Status: AC
  Filled 2017-11-17: qty 2

## 2017-11-17 MED ORDER — EPHEDRINE SULFATE 50 MG/ML IJ SOLN
INTRAMUSCULAR | Status: DC | PRN
Start: 2017-11-17 — End: 2017-11-17
  Administered 2017-11-17 (×3): 10 mg via INTRAVENOUS

## 2017-11-17 MED ORDER — CARVEDILOL 25 MG PO TABS
25.0000 mg | ORAL_TABLET | Freq: Two times a day (BID) | ORAL | Status: DC
  Administered 2017-11-17 – 2017-11-19 (×4): 25 mg via ORAL
  Filled 2017-11-17 (×4): qty 1

## 2017-11-17 MED ORDER — DIPHENHYDRAMINE HCL 12.5 MG/5ML PO ELIX: 12.5000 mg | ORAL_SOLUTION | ORAL | Status: DC | PRN

## 2017-11-17 MED ORDER — ACETAMINOPHEN 650 MG RE SUPP: 650.0000 mg | RECTAL | Status: DC | PRN

## 2017-11-17 MED ORDER — ACETAMINOPHEN 325 MG PO TABS: 650.0000 mg | ORAL_TABLET | ORAL | Status: DC | PRN

## 2017-11-17 MED ORDER — SODIUM CHLORIDE 0.9 % IR SOLN
Status: DC | PRN
  Administered 2017-11-17: 3000 mL

## 2017-11-17 MED ORDER — KETOROLAC TROMETHAMINE 30 MG/ML IJ SOLN
INTRAMUSCULAR | Status: DC | PRN
  Administered 2017-11-17: 30 mg via INTRAVENOUS

## 2017-11-17 MED ORDER — BUMETANIDE 1 MG PO TABS
1.0000 mg | ORAL_TABLET | Freq: Every day | ORAL | Status: DC
  Administered 2017-11-18: 1 mg via ORAL
  Filled 2017-11-17 (×3): qty 1

## 2017-11-17 MED ORDER — BUPIVACAINE-EPINEPHRINE 0.25% -1:200000 IJ SOLN
INTRAMUSCULAR | Status: DC | PRN
  Administered 2017-11-17: 30 mL

## 2017-11-17 MED ORDER — METOCLOPRAMIDE HCL 5 MG PO TABS: 5.0000 mg | ORAL_TABLET | Freq: Three times a day (TID) | ORAL | Status: DC | PRN

## 2017-11-17 MED ORDER — ATORVASTATIN CALCIUM 40 MG PO TABS
40.0000 mg | ORAL_TABLET | Freq: Every evening | ORAL | Status: DC
  Administered 2017-11-17 – 2017-11-18 (×2): 40 mg via ORAL
  Filled 2017-11-17 (×4): qty 1

## 2017-11-17 MED ORDER — ASPIRIN 81 MG PO CHEW
81.0000 mg | CHEWABLE_TABLET | Freq: Two times a day (BID) | ORAL | Status: DC
  Administered 2017-11-17 – 2017-11-19 (×4): 81 mg via ORAL
  Filled 2017-11-17 (×4): qty 1

## 2017-11-17 MED ORDER — DEXAMETHASONE SODIUM PHOSPHATE 10 MG/ML IJ SOLN
INTRAMUSCULAR | Status: AC
  Filled 2017-11-17: qty 1

## 2017-11-17 MED ORDER — ISOPROPYL ALCOHOL 70 % SOLN
Status: AC
  Filled 2017-11-17: qty 480

## 2017-11-17 MED ORDER — STERILE WATER FOR IRRIGATION IR SOLN
Status: DC | PRN
  Administered 2017-11-17: 2000 mL

## 2017-11-17 SURGICAL SUPPLY — 54 items
BAG ZIPLOCK 12X15 (MISCELLANEOUS) IMPLANT
BANDAGE ACE 4X5 VEL STRL LF (GAUZE/BANDAGES/DRESSINGS) ×2 IMPLANT
BANDAGE ACE 6X5 VEL STRL LF (GAUZE/BANDAGES/DRESSINGS) ×2 IMPLANT
BLADE SAW RECIPROCATING 77.5 (BLADE) ×2 IMPLANT
CAPT KNEE TRIATH TK-4 ×2 IMPLANT
CHLORAPREP W/TINT 26ML (MISCELLANEOUS) ×4 IMPLANT
COVER SURGICAL LIGHT HANDLE (MISCELLANEOUS) ×2 IMPLANT
CUFF TOURN SGL QUICK 34 (TOURNIQUET CUFF) ×1
CUFF TRNQT CYL 34X4X40X1 (TOURNIQUET CUFF) ×1 IMPLANT
DECANTER SPIKE VIAL GLASS SM (MISCELLANEOUS) ×4 IMPLANT
DERMABOND ADVANCED (GAUZE/BANDAGES/DRESSINGS) ×1
DERMABOND ADVANCED .7 DNX12 (GAUZE/BANDAGES/DRESSINGS) ×1 IMPLANT
DRAPE SHEET LG 3/4 BI-LAMINATE (DRAPES) ×4 IMPLANT
DRAPE U-SHAPE 47X51 STRL (DRAPES) ×2 IMPLANT
DRSG AQUACEL AG ADV 3.5X10 (GAUZE/BANDAGES/DRESSINGS) ×2 IMPLANT
DRSG TEGADERM 4X4.75 (GAUZE/BANDAGES/DRESSINGS) IMPLANT
ELECT BLADE TIP CTD 4 INCH (ELECTRODE) ×2 IMPLANT
ELECT REM PT RETURN 15FT ADLT (MISCELLANEOUS) ×2 IMPLANT
EVACUATOR 1/8 PVC DRAIN (DRAIN) IMPLANT
GAUZE SPONGE 4X4 12PLY STRL (GAUZE/BANDAGES/DRESSINGS) ×2 IMPLANT
GLOVE BIO SURGEON STRL SZ8.5 (GLOVE) ×4 IMPLANT
GLOVE BIOGEL PI IND STRL 8.5 (GLOVE) ×1 IMPLANT
GLOVE BIOGEL PI INDICATOR 8.5 (GLOVE) ×1
GOWN SPEC L3 XXLG W/TWL (GOWN DISPOSABLE) ×2 IMPLANT
HANDPIECE INTERPULSE COAX TIP (DISPOSABLE) ×1
HOOD PEEL AWAY FLYTE STAYCOOL (MISCELLANEOUS) ×6 IMPLANT
MARKER SKIN DUAL TIP RULER LAB (MISCELLANEOUS) ×2 IMPLANT
NEEDLE SPNL 18GX3.5 QUINCKE PK (NEEDLE) ×2 IMPLANT
NS IRRIG 1000ML POUR BTL (IV SOLUTION) ×2 IMPLANT
PACK TOTAL KNEE CUSTOM (KITS) ×2 IMPLANT
PADDING CAST COTTON 6X4 STRL (CAST SUPPLIES) ×2 IMPLANT
POSITIONER SURGICAL ARM (MISCELLANEOUS) ×2 IMPLANT
SAW OSC TIP CART 19.5X105X1.3 (SAW) ×2 IMPLANT
SEALER BIPOLAR AQUA 6.0 (INSTRUMENTS) ×2 IMPLANT
SET HNDPC FAN SPRY TIP SCT (DISPOSABLE) ×1 IMPLANT
SET PAD KNEE POSITIONER (MISCELLANEOUS) ×2 IMPLANT
SPONGE DRAIN TRACH 4X4 STRL 2S (GAUZE/BANDAGES/DRESSINGS) IMPLANT
SPONGE LAP 18X18 X RAY DECT (DISPOSABLE) IMPLANT
SUCTION FRAZIER HANDLE 12FR (TUBING)
SUCTION TUBE FRAZIER 12FR DISP (TUBING) IMPLANT
SUT MNCRL AB 3-0 PS2 18 (SUTURE) ×2 IMPLANT
SUT MON AB 2-0 CT1 36 (SUTURE) ×4 IMPLANT
SUT STRATAFIX PDO 1 14 VIOLET (SUTURE) ×1
SUT STRATFX PDO 1 14 VIOLET (SUTURE) ×1
SUT VIC AB 1 CT1 36 (SUTURE) ×4 IMPLANT
SUT VIC AB 2-0 CT1 27 (SUTURE) ×2
SUT VIC AB 2-0 CT1 TAPERPNT 27 (SUTURE) ×2 IMPLANT
SUTURE STRATFX PDO 1 14 VIOLET (SUTURE) ×1 IMPLANT
SYR 50ML LL SCALE MARK (SYRINGE) ×2 IMPLANT
TOWER CARTRIDGE SMART MIX (DISPOSABLE) IMPLANT
TRAY FOLEY W/METER SILVER 16FR (SET/KITS/TRAYS/PACK) ×2 IMPLANT
WATER STERILE IRR 1000ML POUR (IV SOLUTION) ×4 IMPLANT
WRAP KNEE MAXI GEL POST OP (GAUZE/BANDAGES/DRESSINGS) ×2 IMPLANT
YANKAUER SUCT BULB TIP 10FT TU (MISCELLANEOUS) ×2 IMPLANT

## 2017-11-17 NOTE — Addendum Note (Signed)
Addendum  created 11/17/17 1236 by [place] Pugh, CRNA   Intraprocedure Meds edited

## 2017-11-17 NOTE — Op Note (Signed)
OPERATIVE REPORT  SURGEON: Rod Can, MD   ASSISTANT: Nehemiah Massed, PA-C.  PREOPERATIVE DIAGNOSIS: Right knee arthritis.   POSTOPERATIVE DIAGNOSIS: Right knee arthritis.   PROCEDURE: Right total knee arthroplasty.   IMPLANTS: Stryker Triathlon CR femur, size 8. Stryker Tritanium tibia, size 7. X3 polyethelyene insert, size 9 mm, CR. 3 button asymmetric patella, size 40 mm.  ANESTHESIA:  Regional and Spinal  TOURNIQUET TIME: Not utilized.   ESTIMATED BLOOD LOSS:-350 mL    ANTIBIOTICS: 3 g Ancef.  DRAINS: None.  COMPLICATIONS: None   CONDITION: PACU - hemodynamically stable.   BRIEF CLINICAL NOTE: Todd Robertson is a 72 y.o. male with a long-standing history of Right knee arthritis. After failing conservative management, the patient was indicated for total knee arthroplasty. The risks, benefits, and alternatives to the procedure were explained, and the patient elected to proceed.  PROCEDURE IN DETAIL: Adductor canal block was obtained in the pre-op holding area. Once inside the operative room, spinal anesthesia was obtained, and a foley catheter was inserted. The patient was then positioned, a nonsterile tourniquet was placed, and the lower extremity was prepped and draped in the normal sterile surgical fashion. A time-out was called verifying side and site of surgery. The patient received IV antibiotics within 60 minutes of beginning the procedure. The tourniquet was not utilized.  An anterior approach to the knee was performed utilizing a midvastus arthrotomy. A medial release was performed and the patellar fat pad was excised. Stryker navigation was used to cut the distal femur perpendicular to the mechanical axis. A freehand patellar resection was performed, and the patella was sized an prepared with 3 lug holes.  Nagivation was used to make a neutral proximal tibia  resection, taking 8 mm of bone from the less affected lateral side with 3 degrees of slope. The menisci were excised. A spacer block was placed, and the alignment and balance in extension were confirmed.   The distal femur was sized using the 3-degree external rotation guide referencing the posterior femoral cortex. The appropriate 4-in-1 cutting block was pinned into place. Rotation was checked using Whiteside's line, the epicondylar axis, and then confirmed with a spacer block in flexion. The remaining femoral cuts were performed, taking care to protect the MCL.  The tibia was sized and the trial tray was pinned into place. The remaining trail components were inserted. The knee was stable to varus and valgus stress through a full range of motion. The patella tracked centrally, and the PCL was well balanced. The trial components were removed, and the proximal tibial surface was prepared. Final components were impacted into place. The knee was tested for a final time and found to be well balanced.  The wound was copiously irrigated with normal saline with pulse lavage. Marcaine solution was injected into the periarticular soft tissue. The wound was closed in layers using #1 Vicryl and Stratafix for the fascia, 2-0 Vicryl for the subcutaneous fat, 2-0 Monocryl for the deep dermal layer, 3-0 running Monocryl subcuticular Stitch, and Dermabond for the skin. Once the glue was fully dried, an Aquacell Ag and compressive dressing were applied. Tthe patient was transported to the recovery room in stable condition. Sponge, needle, and instrument counts were correct at the end of the case x2. The patient tolerated the procedure well and there were no known complications.  Please note that a surgical assistant was a medical necessity for this procedure in order to perform it in a safe and expeditious manner. Surgical assistant was necessary  to retract the ligaments and vital neurovascular structures to prevent  injury to them and also necessary for proper positioning of the limb to allow for anatomic placement of the prosthesis.

## 2017-11-17 NOTE — Discharge Instructions (Signed)
° °Dr. Safir Michalec °Total Joint Specialist °[facility] °[address] °[place], [place] [address] °[phone] ° °TOTAL KNEE REPLACEMENT POSTOPERATIVE DIRECTIONS ° ° ° °Knee Rehabilitation, Guidelines Following Surgery  °Results after knee surgery are often greatly improved when you follow the exercise, range of motion and muscle strengthening exercises prescribed by your doctor. Safety measures are also important to protect the knee from further injury. Any time any of these exercises cause you to have increased pain or swelling in your knee joint, decrease the amount until you are comfortable again and slowly increase them. If you have problems or questions, call your caregiver or physical therapist for advice.  ° °WEIGHT BEARING °Weight bearing as tolerated with assist device (walker, cane, etc) as directed, use it as long as suggested by your surgeon or therapist, typically at least 4-6 weeks. ° °HOME CARE INSTRUCTIONS  °Remove items at home which could result in a fall. This includes throw rugs or furniture in walking pathways.  °Continue medications as instructed at time of discharge. °You may have some home medications which will be placed on hold until you complete the course of blood thinner medication.  °You may start showering once you are discharged home but do not submerge the incision under water. Just pat the incision dry and apply a dry gauze dressing on daily. °Walk with walker as instructed.  °You may resume a sexual relationship in one month or when given the OK by your doctor.  °· Use walker as long as suggested by your caregivers. °· Avoid periods of inactivity such as sitting longer than an hour when not asleep. This helps prevent blood clots.  °You may put full weight on your legs and walk as much as is comfortable.  °You may return to work once you are cleared by your doctor.  °Do not drive a car for 6 weeks or until released by you surgeon.  °· Do not drive  while taking narcotics.  °Wear the elastic stockings for three weeks following surgery during the day but you may remove then at night. °Make sure you keep all of your appointments after your operation with all of your doctors and caregivers. You should call the office at the above phone number and make an appointment for approximately two weeks after the date of your surgery. °Do not remove your surgical dressing. The dressing is waterproof; you may take showers in 3 days, but do not take tub baths or submerge the dressing. °Please pick up a stool softener and laxative for home use as long as you are requiring pain medications. °· ICE to the affected knee every three hours for 30 minutes at a time and then as needed for pain and swelling.  Continue to use ice on the knee for pain and swelling from surgery. You may notice swelling that will progress down to the foot and ankle.  This is normal after surgery.  Elevate the leg when you are not up walking on it.   °It is important for you to complete the blood thinner medication as prescribed by your doctor. °· Continue to use the breathing machine which will help keep your temperature down.  It is common for your temperature to cycle up and down following surgery, especially at night when you are not up moving around and exerting yourself.  The breathing machine keeps your lungs expanded and your temperature down. ° °RANGE OF MOTION AND STRENGTHENING EXERCISES  °Rehabilitation of the knee is important following   a knee injury or an operation. After just a few days of immobilization, the muscles of the thigh which control the knee become weakened and shrink (atrophy). Knee exercises are designed to build up the tone and strength of the thigh muscles and to improve knee motion. Often times heat used for twenty to thirty minutes before working out will loosen up your tissues and help with improving the range of motion but do not use heat for the first two weeks following  surgery. These exercises can be done on a training (exercise) mat, on the floor, on a table or on a bed. Use what ever works the best and is most comfortable for you Knee exercises include:  °Leg Lifts - While your knee is still immobilized in a splint or cast, you can do straight leg raises. Lift the leg to 60 degrees, hold for 3 sec, and slowly lower the leg. Repeat 10-20 times 2-3 times daily. Perform this exercise against resistance later as your knee gets better.  °Quad and Hamstring Sets - Tighten up the muscle on the front of the thigh (Quad) and hold for 5-10 sec. Repeat this 10-20 times hourly. Hamstring sets are done by pushing the foot backward against an object and holding for 5-10 sec. Repeat as with quad sets.  °A rehabilitation program following serious knee injuries can speed recovery and prevent re-injury in the future due to weakened muscles. Contact your doctor or a physical therapist for more information on knee rehabilitation.  ° °SKILLED REHAB INSTRUCTIONS: °If the patient is transferred to a skilled rehab facility following release from the hospital, a list of the current medications will be sent to the facility for the patient to continue.  When discharged from the skilled rehab facility, please have the facility set up the patient's Home Health Physical Therapy prior to being released. Also, the skilled facility will be responsible for providing the patient with their medications at time of release from the facility to include their pain medication, the muscle relaxants, and their blood thinner medication. If the patient is still at the rehab facility at time of the two week follow up appointment, the skilled rehab facility will also need to assist the patient in arranging follow up appointment in our office and any transportation needs. ° °MAKE SURE YOU:  °Understand these instructions.  °Will watch your condition.  °Will get help right away if you are not doing well or get worse.   ° ° °Pick up stool softner and laxative for home use following surgery while on pain medications. °Do NOT remove your dressing. You may shower.  °Do not take tub baths or submerge incision under water. °May shower starting three days after surgery. °Please use a clean towel to pat the incision dry following showers. °Continue to use ice for pain and swelling after surgery. °Do not use any lotions or creams on the incision until instructed by your surgeon. ° °

## 2017-11-17 NOTE — Anesthesia Procedure Notes (Signed)
Spinal  Patient location during procedure: OR Start time: 11/17/2017 7:40 AM End time: 11/17/2017 7:49 AM Staffing Anesthesiologist: Suzette Battiest, MD Performed: anesthesiologist  Preanesthetic Checklist Completed: patient identified, site marked, surgical consent, pre-op evaluation, timeout performed, IV checked, risks and benefits discussed and monitors and equipment checked Spinal Block Patient position: sitting Prep: site prepped and draped and DuraPrep Patient monitoring: blood pressure, continuous pulse ox and heart rate Approach: right paramedian Location: L4-5 Injection technique: single-shot Needle Needle type: Quincke  Needle gauge: 22 G Needle length: 9 cm Needle insertion depth: 9 cm Additional Notes Multiple attempts at midline approach unsuccessful at several different levels. Right paramedian approach with 22G quincke with free flowing csf. Aspiration before, during and after injection LA. Pt tolerated procedure well without any immediate complications.

## 2017-11-17 NOTE — Anesthesia Procedure Notes (Signed)
Procedure Name: MAC Date/Time: 11/17/2017 7:36 AM Performed by: West Pugh, CRNA Pre-anesthesia Checklist: Patient identified, Emergency Drugs available, Suction available, Patient being monitored and Timeout performed Patient Re-evaluated:Patient Re-evaluated prior to induction Oxygen Delivery Method: Nasal cannula and Simple face mask Placement Confirmation: CO2 detector,  positive ETCO2 and breath sounds checked- equal and bilateral Dental Injury: Teeth and Oropharynx as per pre-operative assessment

## 2017-11-17 NOTE — Anesthesia Procedure Notes (Signed)
Anesthesia Regional Block: Adductor canal block   Pre-Anesthetic Checklist: ,, timeout performed, Correct Patient, Correct Site, Correct Laterality, Correct Procedure, Correct Position, site marked, Risks and benefits discussed,  Surgical consent,  Pre-op evaluation,  At surgeon's request and post-op pain management  Laterality: Right  Prep: chloraprep       Needles:  Injection technique: Single-shot  Needle Type: Echogenic Needle     Needle Length: 9cm  Needle Gauge: 21     Additional Needles:   Procedures:,,,, ultrasound used (permanent image in chart),,,,  Narrative:  Start time: 11/17/2017 7:05 AM End time: 11/17/2017 7:11 AM Injection made incrementally with aspirations every 5 mL.  Performed by: Personally  Anesthesiologist: Suzette Battiest, MD

## 2017-11-17 NOTE — Transfer of Care (Signed)
Immediate Anesthesia Transfer of Care Note  Patient: Todd Robertson  Procedure(s) Performed: RIGHT TOTAL KNEE ARTHROPLASTY WITH COMPUTER NAVIGATION (Right Knee)  Patient Location: PACU  Anesthesia Type:Spinal  Level of Consciousness: sedated  Airway & Oxygen Therapy: Patient Spontanous Breathing and Patient connected to face mask oxygen  Post-op Assessment: Report given to RN and Post -op Vital signs reviewed and stable  Post vital signs: Reviewed and stable  Last Vitals:  Vitals:   11/17/17 0532  BP: (!) 182/92  Pulse: 70  Resp: 18  Temp: 36.7 C  SpO2: 98%    Last Pain:  Vitals:   11/17/17 0532  TempSrc: Oral         Complications: No apparent anesthesia complications

## 2017-11-17 NOTE — Anesthesia Postprocedure Evaluation (Signed)
Anesthesia Post Note  Patient: RUMALDO DIFATTA  Procedure(s) Performed: RIGHT TOTAL KNEE ARTHROPLASTY WITH COMPUTER NAVIGATION (Right Knee)     Patient location during evaluation: PACU Anesthesia Type: Spinal Level of consciousness: awake and alert Pain management: pain level controlled Vital Signs Assessment: post-procedure vital signs reviewed and stable Respiratory status: spontaneous breathing and respiratory function stable Cardiovascular status: blood pressure returned to baseline and stable Postop Assessment: spinal receding Anesthetic complications: no    Last Vitals:  Vitals:   11/17/17 1145 11/17/17 1200  BP: 136/82 (!) 141/82  Pulse: 65 67  Resp: 19 19  Temp: 36.6 C 36.5 C  SpO2: 100% 100%    Last Pain:  Vitals:   11/17/17 0532  TempSrc: Oral    LLE Motor Response: Purposeful movement;Responds to commands (11/17/17 1207) LLE Sensation: Full sensation (11/17/17 1207) RLE Motor Response: Purposeful movement (11/17/17 1207) RLE Sensation: Full sensation (11/17/17 1207) L Sensory Level: S1-Sole of foot, small toes (11/17/17 1207) R Sensory Level: S1-Sole of foot, small toes (11/17/17 1207)  Tiajuana Amass

## 2017-11-17 NOTE — Interval H&P Note (Signed)
History and Physical Interval Note:  11/17/2017 7:26 AM  Todd Robertson  has presented today for surgery, with the diagnosis of Degenerative joint disease right knee  The various methods of treatment have been discussed with the patient and family. After consideration of risks, benefits and other options for treatment, the patient has consented to  Procedure(s) with comments: RIGHT TOTAL KNEE ARTHROPLASTY WITH COMPUTER NAVIGATION (Right) - NEEDS RNFA as a surgical intervention .  The patient's history has been reviewed, patient examined, no change in status, stable for surgery.  I have reviewed the patient's chart and labs.  Questions were answered to the patient's satisfaction.     Hilton Cork Annie Saephan

## 2017-11-17 NOTE — Evaluation (Signed)
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  Pt should progress well to dc home with family assist and plans to start OP PT 11/22/17.    Follow Up Recommendations DC plan and follow up therapy as arranged by surgeon    Equipment Recommendations  None recommended by PT    Recommendations for Other Services       Precautions / Restrictions Precautions Precautions: Fall Restrictions Weight Bearing Restrictions: No      Mobility  Bed Mobility Overal bed mobility: Needs Assistance Bed Mobility: Supine to Sit     Supine to sit: Min assist     General bed mobility comments: cues for sequence and use of R LE to self assist  Transfers Overall transfer level: Needs assistance Equipment used: Rolling walker (2 wheeled) Transfers: Sit to/from Stand Sit to Stand: Min assist         General transfer comment: cues for LE management and use of UEs to self assist  Ambulation/Gait Ambulation/Gait assistance: Min assist Ambulation Distance (Feet): 50 Feet Assistive device: Rolling walker (2 wheeled) Gait Pattern/deviations: Step-to pattern;Decreased step length - right;Decreased step length - left;Shuffle;Trunk flexed Gait velocity: decr Gait velocity interpretation: Below normal speed for age/gender General Gait Details: cues for sequence, posture and position from [employer]            Wheelchair Mobility    Modified Rankin (Stroke Patients Only)       Balance                                             Pertinent Vitals/Pain Pain Assessment: 0-10 Pain Score: 5  Pain Location: L knee Pain Descriptors / Indicators: Sore;Aching Pain  Intervention(s): Limited activity within patient's tolerance;Monitored during session;Premedicated before session;Ice applied    Home Living Family/patient expects to be discharged to:: Private residence Living Arrangements: Spouse/significant other Available Help at Discharge: Family;Available 24 hours/day Type of Home: House Home Access: Level entry     Home Layout: One level Home Equipment: Walker - 2 wheels;Bedside commode      Prior Function Level of Independence: Independent               Hand Dominance        Extremity/Trunk Assessment   Upper Extremity Assessment Upper Extremity Assessment: Overall WFL for tasks assessed    Lower Extremity Assessment Lower Extremity Assessment: LLE deficits/detail    Cervical / Trunk Assessment Cervical / Trunk Assessment: Normal  Communication   Communication: No difficulties  Cognition Arousal/Alertness: Awake/alert Behavior During Therapy: WFL for tasks assessed/performed Overall Cognitive Status: Within Functional Limits for tasks assessed                                        General Comments      Exercises Total Joint Exercises Ankle Circles/Pumps: AROM;Both;15 reps;Supine   Assessment/Plan    PT Assessment Patient needs continued PT services  PT Problem List Decreased strength;Decreased range of motion;Decreased activity tolerance;Decreased  mobility;Pain;Decreased knowledge of use of DME       PT Treatment Interventions DME instruction;Gait training;Stair training;Functional mobility training;Therapeutic activities;Therapeutic exercise;Patient/family education    PT Goals (Current goals can be found in the Care Plan section)  Acute Rehab PT Goals Patient Stated Goal: Regain IND PT Goal Formulation: With patient Time For Goal Achievement: 11/24/17 Potential to Achieve Goals: Good    Frequency 7X/week   Barriers to discharge        Co-evaluation               AM-PAC PT  "6 Clicks" Daily Activity  Outcome Measure Difficulty turning over in bed (including adjusting bedclothes, sheets and blankets)?: Unable Difficulty moving from lying on back to sitting on the side of the bed? : Unable Difficulty sitting down on and standing up from a chair with arms (e.g., wheelchair, bedside commode, etc,.)?: Unable Help needed moving to and from a bed to chair (including a wheelchair)?: A Little Help needed walking in hospital room?: A Little Help needed climbing 3-5 steps with a railing? : A Little 6 Click Score: 12    End of Session Equipment Utilized During Treatment: Gait belt Activity Tolerance: Patient tolerated treatment well Patient left: in chair;with call bell/phone within reach;with family/visitor present Nurse Communication: Mobility status PT Visit Diagnosis: Difficulty in walking, not elsewhere classified (R26.2)    Time: 2162-4469 PT Time Calculation (min) (ACUTE ONLY): 28 min   Charges:   PT Evaluation $PT Eval Low Complexity: 1 Low PT Treatments $Gait Training: 8-22 mins   PT G Codes:        Pg [phone]   Dayon Witt 11/17/2017, 4:14 PM

## 2017-11-18 LAB — GLUCOSE, CAPILLARY
GLUCOSE-CAPILLARY: 123 mg/dL — AB (ref 65–99)
GLUCOSE-CAPILLARY: 186 mg/dL — AB (ref 65–99)
GLUCOSE-CAPILLARY: 202 mg/dL — AB (ref 65–99)
Glucose-Capillary: 102 mg/dL — ABNORMAL HIGH (ref 65–99)

## 2017-11-18 LAB — CBC
HEMATOCRIT: 28.5 % — AB (ref 39.0–52.0)
Hemoglobin: 9 g/dL — ABNORMAL LOW (ref 13.0–17.0)
MCH: 29.7 pg (ref 26.0–34.0)
MCHC: 31.6 g/dL (ref 30.0–36.0)
MCV: 94.1 fL (ref 78.0–100.0)
Platelets: 163 10*3/uL (ref 150–400)
RBC: 3.03 MIL/uL — ABNORMAL LOW (ref 4.22–5.81)
RDW: 14.6 % (ref 11.5–15.5)
WBC: 7.2 10*3/uL (ref 4.0–10.5)

## 2017-11-18 LAB — BASIC METABOLIC PANEL
ANION GAP: 4 — AB (ref 5–15)
BUN: 26 mg/dL — ABNORMAL HIGH (ref 6–20)
CALCIUM: 8.1 mg/dL — AB (ref 8.9–10.3)
CHLORIDE: 107 mmol/L (ref 101–111)
CO2: 27 mmol/L (ref 22–32)
Creatinine, Ser: 1.8 mg/dL — ABNORMAL HIGH (ref 0.61–1.24)
GFR calc non Af Amer: 37 mL/min — ABNORMAL LOW (ref >60)
GFR, EST AFRICAN AMERICAN: 43 mL/min — AB (ref >60)
GLUCOSE: 122 mg/dL — AB (ref 65–99)
Potassium: 4.3 mmol/L (ref 3.5–5.1)
Sodium: 138 mmol/L (ref 135–145)

## 2017-11-18 MED ORDER — HYDROCODONE-ACETAMINOPHEN 7.5-325 MG PO TABS: 1.0000 | ORAL_TABLET | ORAL | 0 refills | Status: DC | PRN

## 2017-11-18 MED ORDER — ASPIRIN 81 MG PO CHEW: 81.0000 mg | CHEWABLE_TABLET | Freq: Two times a day (BID) | ORAL | 1 refills | Status: DC

## 2017-11-18 MED ORDER — ONDANSETRON HCL 4 MG PO TABS: 4.0000 mg | ORAL_TABLET | Freq: Four times a day (QID) | ORAL | 0 refills | Status: DC | PRN

## 2017-11-18 NOTE — Progress Notes (Signed)
   Subjective:  Patient reports pain as mild to moderate.  Denies N/V/CP/SOB.  Objective:   VITALS:   Vitals:   11/17/17 1852 11/17/17 2115 11/18/17 0036 11/18/17 0641  BP: 124/81 (!) 144/73 128/81 124/74  Pulse: 61 62 68 70  Resp: 18 15 15 16   Temp: 98.3 F (36.8 C) 97.6 F (36.4 C) 98 F (36.7 C) 98 F (36.7 C)  TempSrc: Oral Oral Oral Oral  SpO2: 100% 98% 98% 98%  Weight:      Height:       NAD ABD soft Sensation intact distally Intact pulses distally Dorsiflexion/Plantar flexion intact Incision: dressing C/D/I Compartment soft   Lab Results  Component Value Date   WBC 7.2 11/18/2017   HGB 9.0 (L) 11/18/2017   HCT 28.5 (L) 11/18/2017   MCV 94.1 11/18/2017   PLT 163 11/18/2017   BMET    Component Value Date/Time   NA 138 11/18/2017 0531   K 4.3 11/18/2017 0531   CL 107 11/18/2017 0531   CO2 27 11/18/2017 0531   GLUCOSE 122 (H) 11/18/2017 0531   BUN 26 (H) 11/18/2017 0531   CREATININE 1.80 (H) 11/18/2017 0531   CALCIUM 8.1 (L) 11/18/2017 0531   GFRNONAA 37 (L) 11/18/2017 0531   GFRAA 43 (L) 11/18/2017 0531     Assessment/Plan: 1 Day Post-Op   Principal Problem:   Osteoarthritis of right knee   WBAT with walker ASA, SCDs, TEDs PO pain control PT/OT D/C home tomorrow with outpatient PT   Todd Robertson 11/18/2017, 9:35 AM   Rod Can, MD Cell [phone]

## 2017-11-18 NOTE — Progress Notes (Signed)
OT Cancellation Note  Patient Details Name: Todd Robertson MRN: [id number] DOB: [birth year]   Cancelled Treatment:    Reason Eval/Treat Not Completed: OT screened, no needs identified, will sign off.  Pt just had L TKA in October.    Boe Deans 11/18/2017, 7:59 AM  Lesle Chris, OTR/L [phone] 11/18/2017

## 2017-11-18 NOTE — Discharge Summary (Signed)
Physician Discharge Summary  Patient ID: Todd Robertson MRN: [id number] DOB/AGE: [birth year] 70 y.o.  Admit date: 11/17/2017 Discharge date: 11/19/2017  Admission Diagnoses:  Osteoarthritis of right knee  Discharge Diagnoses:  Principal Problem:   Osteoarthritis of right knee   Past Medical History:  Diagnosis Date  . Arthritis   . Cancer [facility])    prostate  . Cardiomyopathy    secondary  . CKD (chronic kidney disease)   . DM2 (diabetes mellitus, type 2) ([place])   . HTN (hypertension)    unspec  . Nonischemic cardiomyopathy ([place])    a. EF 40-50% by most recent 2-D echo b. EF 25% by cardiac cath in 2004  . Overweight(278.02)     Surgeries: Procedure(s): RIGHT TOTAL KNEE ARTHROPLASTY WITH COMPUTER NAVIGATION on 11/17/2017   Consultants (if any):   Discharged Condition: Improved  Hospital Course: LINUS WECKERLY is an 72 y.o. male who was admitted 11/17/2017 with a diagnosis of Osteoarthritis of right knee and went to the operating room on 11/17/2017 and underwent the above named procedures.    He was given perioperative antibiotics:  Anti-infectives (From admission, onward)   Start     Dose/Rate Route Frequency Ordered Stop   11/17/17 1400  ceFAZolin (ANCEF) IVPB 2g/100 mL premix     2 g 200 mL/hr over 30 Minutes Intravenous Every 6 hours 11/17/17 1207 11/17/17 2018   11/17/17 0600  ceFAZolin (ANCEF) 3 g in dextrose 5 % 50 mL IVPB     3 g 130 mL/hr over 30 Minutes Intravenous On call to O.R. 11/16/17 1617 11/17/17 0820    .  He was given sequential compression devices, early ambulation, and ASA for DVT prophylaxis.  He benefited maximally from the hospital stay and there were no complications.    Recent vital signs:  Vitals:   11/18/17 2154 11/19/17 0512  BP: (!) 141/75 130/71  Pulse: 80 72  Resp: 17 15  Temp: 98.7 F (37.1 C) 98.5 F (36.9 C)  SpO2: 96% 97%    Recent laboratory studies:  Lab Results  Component Value Date   HGB 8.8 (L) 11/19/2017   HGB  9.0 (L) 11/18/2017   HGB 11.6 (L) 11/11/2017   Lab Results  Component Value Date   WBC 10.7 (H) 11/19/2017   PLT 176 11/19/2017   No results found for: INR Lab Results  Component Value Date   NA 138 11/18/2017   K 4.3 11/18/2017   CL 107 11/18/2017   CO2 27 11/18/2017   BUN 26 (H) 11/18/2017   CREATININE 1.80 (H) 11/18/2017   GLUCOSE 122 (H) 11/18/2017    Discharge Medications:   Allergies as of 11/19/2017   No Known Allergies     Medication List    STOP taking these medications   HYDROcodone-acetaminophen 5-325 MG tablet Commonly known as:  NORCO/VICODIN Replaced by:  HYDROcodone-acetaminophen 7.5-325 MG tablet     TAKE these medications   allopurinol 300 MG tablet Commonly known as:  ZYLOPRIM Take 300 mg by mouth every morning.   aspirin 81 MG chewable tablet Chew 1 tablet (81 mg total) by mouth 2 (two) times daily. What changed:  Another medication with the same name was removed. Continue taking this medication, and follow the directions you see here.   atorvastatin 40 MG tablet Commonly known as:  LIPITOR Take 1 tablet (40 mg total) by mouth daily. What changed:  when to take this   benazepril 20 MG tablet Commonly known as:  LOTENSIN Take 40  mg by mouth every morning.   budesonide-formoterol 160-4.5 MCG/ACT inhaler Commonly known as:  SYMBICORT Inhale 1 puff into the lungs daily. may take 2nd dose in the evening if needed for shortness of breath   bumetanide 1 MG tablet Commonly known as:  BUMEX Take 1 mg by mouth daily.   carvedilol 25 MG tablet Commonly known as:  COREG Take 1 tablet (25 mg total) by mouth 2 (two) times daily.   cetirizine 10 MG tablet Commonly known as:  ZYRTEC Take 10 mg by mouth daily.   docusate sodium 100 MG capsule Commonly known as:  COLACE Take 1 capsule (100 mg total) by mouth 2 (two) times daily. What changed:  when to take this   FIASP 100 UNIT/ML injection Generic drug:  insulin aspart Inject 1-12 Units into  the skin 3 (three) times daily as needed for high blood sugar. 150-200 units = 1 unit ,201-250 = 3 units , 251-300 = 5 units , 301-350 = 7 units, 351- 400 = 9 units, greater than 400 = 12 units   HYDROcodone-acetaminophen 7.5-325 MG tablet Commonly known as:  NORCO Take 1-2 tablets by mouth every 4 (four) hours as needed (knee pain). Replaces:  HYDROcodone-acetaminophen 5-325 MG tablet   LUBRICATING EYE DROPS OP Apply 1 drop to eye daily.   MUSCLE RUB 10-15 % Crea Apply 1 application topically as needed for muscle pain.   ondansetron 4 MG tablet Commonly known as:  ZOFRAN Take 1 tablet (4 mg total) by mouth every 6 (six) hours as needed for nausea.   senna 8.6 MG Tabs tablet Commonly known as:  SENOKOT Take 2 tablets (17.2 mg total) by mouth at bedtime.       Diagnostic Studies: Dg Knee Right Port  Result Date: 11/17/2017 CLINICAL DATA:  Status post right total knee joint prosthesis placement. EXAM: PORTABLE RIGHT KNEE - 1-2 VIEW COMPARISON:  None available in [facility] FINDINGS: The patient has undergone total knee joint prosthesis placement. Radiographic positioning of the prosthetic components is good. The interface with the native bone appears normal. There is air and fluid in the anterior aspect of the joint space. IMPRESSION: No immediate postprocedure complication following right knee joint prosthesis placement. Electronically Signed   By: David  Martinique M.D.   On: 11/17/2017 11:00    Disposition: 01-Home or Self Care  Discharge Instructions    Call MD / Call 911   Complete by:  As directed    If you experience chest pain or shortness of breath, CALL 911 and be transported to the hospital emergency room.  If you develope a fever above 101 F, pus (white drainage) or increased drainage or redness at the wound, or calf pain, call your surgeon's office.   Constipation Prevention   Complete by:  As directed    Drink plenty of fluids.  Prune juice may be helpful.  You may use a stool  softener, such as Colace (over the counter) 100 mg twice a day.  Use MiraLax (over the counter) for constipation as needed.   Diet - low sodium heart healthy   Complete by:  As directed    Increase activity slowly as tolerated   Complete by:  As directed       Follow-up Information    Renaye Janicki, Aaron Edelman, MD. Schedule an appointment as soon as possible for a visit in 2 weeks.   Specialty:  Orthopedic Surgery Why:  For wound re-check Contact information: [address] [place] [address] [phone]  Signed: Hilton Cork Sylva Overley 11/21/2017, 7:45 AM

## 2017-11-18 NOTE — Progress Notes (Signed)
Physical Therapy Treatment Patient Details Name: Todd Robertson MRN: [id number] DOB: 02-24-1946 Today's Date: 11/18/2017    History of Present Illness Pt s/p R TKR and with hx of L TKR 10/18, DM, CKD, and cardiomyopathy    PT Comments    POD #1 am session Pt OOB in bathroom with NT assisting.  Returned to bed due to nausea with vomiting x 3.   Pt agreed to TE's this session.                                     Exercises   Total Knee Replacement TE's 10 reps B LE ankle pumps 10 reps towel squeezes 10 reps knee presses 10 reps heel slides  10 reps SAQ's 10 reps SLR's 10 reps ABD Followed by ICE              Time: 1572-6203 PT Time Calculation (min) (ACUTE ONLY): 13 min  Charges:  $Therapeutic Exercise: 8-22 mins                    G Codes:       {Jazon Jipson  PTA [facility]  Acute  Rehab Pager      [phone]

## 2017-11-18 NOTE — Progress Notes (Signed)
Physical Therapy Treatment Patient Details Name: Todd Robertson MRN: [id number] DOB: 1946-09-21 Today's Date: 11/18/2017    History of Present Illness Pt s/p R TKR and with hx of L TKR 10/18, DM, CKD, and cardiomyopathy    PT Comments    POD # 1 pm session Pt feeling better but did not touch his lunch tray.  Required increased time between position change due to fear of more vomit, per pt. Assisted OOB to amb with mild nausea/queezy feeling.  Assisted back to bed and applied ICE.   Follow Up Recommendations  DC plan and follow up therapy as arranged by surgeon     Equipment Recommendations  None recommended by PT    Recommendations for Other Services       Precautions / Restrictions Precautions Precautions: Fall Precaution Comments: reviewed no pillow under knee Restrictions Weight Bearing Restrictions: No Other Position/Activity Restrictions: WBAT    Mobility  Bed Mobility Overal bed mobility: Needs Assistance Bed Mobility: Supine to Sit;Sit to Supine     Supine to sit: Supervision;Min guard Sit to supine: Supervision;Min guard   General bed mobility comments: increased time  Transfers Overall transfer level: Needs assistance Equipment used: Rolling walker (2 wheeled) Transfers: Sit to/from Stand Sit to Stand: Min guard         General transfer comment: cues for LE management and use of UEs to self assist  Ambulation/Gait Ambulation/Gait assistance: Min guard;Supervision Ambulation Distance (Feet): 65 Feet Assistive device: Rolling walker (2 wheeled) Gait Pattern/deviations: Step-to pattern;Decreased step length - right;Decreased step length - left;Shuffle;Trunk flexed Gait velocity: decr   General Gait Details: cues for sequence, posture and position from [employer]            Wheelchair Mobility    Modified Rankin (Stroke Patients Only)       Balance                                            Cognition  Arousal/Alertness: Awake/alert Behavior During Therapy: WFL for tasks assessed/performed Overall Cognitive Status: Within Functional Limits for tasks assessed                                        Exercises      General Comments        Pertinent Vitals/Pain Pain Assessment: 0-10 Pain Score: 3  Pain Location: L knee Pain Descriptors / Indicators: Sore;Aching;Operative site guarding Pain Intervention(s): Monitored during session;Repositioned;Ice applied    Home Living                      Prior Function            PT Goals (current goals can now be found in the care plan section) Progress towards PT goals: Progressing toward goals    Frequency    7X/week      PT Plan Current plan remains appropriate    Co-evaluation              AM-PAC PT "6 Clicks" Daily Activity  Outcome Measure  Difficulty turning over in bed (including adjusting bedclothes, sheets and blankets)?: Unable Difficulty moving from lying on back to sitting on the side of the bed? : Unable Difficulty sitting down on and standing up from  a chair with arms (e.g., wheelchair, bedside commode, etc,.)?: Unable Help needed moving to and from a bed to chair (including a wheelchair)?: A Little Help needed walking in hospital room?: A Little Help needed climbing 3-5 steps with a railing? : A Little 6 Click Score: 12    End of Session Equipment Utilized During Treatment: Gait belt Activity Tolerance: Patient tolerated treatment well Patient left: in bed;with call bell/phone within reach Nurse Communication: Mobility status PT Visit Diagnosis: Difficulty in walking, not elsewhere classified (R26.2)     Time: 4847-2072 PT Time Calculation (min) (ACUTE ONLY): 23 min  Charges:  $Therapeutic Exercise: 8-22 mins $Therapeutic Activity: 8-22 mins                    G Codes:       {Karren Newland  PTA [facility]  Acute  Rehab Pager      [phone]

## 2017-11-19 LAB — CBC
HEMATOCRIT: 27.6 % — AB (ref 39.0–52.0)
Hemoglobin: 8.8 g/dL — ABNORMAL LOW (ref 13.0–17.0)
MCH: 29 pg (ref 26.0–34.0)
MCHC: 31.9 g/dL (ref 30.0–36.0)
MCV: 91.1 fL (ref 78.0–100.0)
Platelets: 176 10*3/uL (ref 150–400)
RBC: 3.03 MIL/uL — ABNORMAL LOW (ref 4.22–5.81)
RDW: 14 % (ref 11.5–15.5)
WBC: 10.7 10*3/uL — AB (ref 4.0–10.5)

## 2017-11-19 LAB — GLUCOSE, CAPILLARY: GLUCOSE-CAPILLARY: 160 mg/dL — AB (ref 65–99)

## 2017-11-19 NOTE — Progress Notes (Signed)
Physical Therapy Treatment Patient Details Name: Todd Robertson MRN: [id number] DOB: 05-04-1946 Today's Date: 11/19/2017    History of Present Illness Pt s/p R TKR and with hx of L TKR 10/18, DM, CKD, and cardiomyopathy    PT Comments    POD # 2 Pt progressing well.  Amb a greater distance and performed all TKR TE's following HEP handout.  Instructed on proper tech, freq as well as use of ICE. Pt has met goals to D/C to home.   Follow Up Recommendations  DC plan and follow up therapy as arranged by surgeon     Equipment Recommendations  None recommended by PT    Recommendations for Other Services       Precautions / Restrictions Precautions Precautions: Fall Precaution Comments: reviewed no pillow under knee Restrictions Weight Bearing Restrictions: No Other Position/Activity Restrictions: WBAT    Mobility  Bed Mobility Overal bed mobility: Modified Independent             General bed mobility comments: increased time  Transfers Overall transfer level: Needs assistance Equipment used: Rolling walker (2 wheeled) Transfers: Sit to/from Stand Sit to Stand: Supervision         General transfer comment: good safety cogniton and use of hands to steady self  Ambulation/Gait Ambulation/Gait assistance: Supervision Ambulation Distance (Feet): 145 Feet Assistive device: Rolling walker (2 wheeled) Gait Pattern/deviations: Step-to pattern;Decreased step length - right;Decreased step length - left;Shuffle;Trunk flexed Gait velocity: decr   General Gait Details: good safety tech   Stairs Stairs: (no stairs)          Wheelchair Mobility    Modified Rankin (Stroke Patients Only)       Balance                                            Cognition Arousal/Alertness: Awake/alert Behavior During Therapy: WFL for tasks assessed/performed Overall Cognitive Status: Within Functional Limits for tasks assessed                                         Exercises   Total Knee Replacement TE's 10 reps B LE ankle pumps 10 reps towel squeezes 10 reps knee presses 10 reps heel slides  10 reps SAQ's 10 reps SLR's 10 reps ABD Followed by ICE     General Comments        Pertinent Vitals/Pain Pain Assessment: 0-10 Pain Score: 5  Pain Location: L knee Pain Descriptors / Indicators: Sore;Aching;Operative site guarding Pain Intervention(s): Monitored during session;Repositioned;Ice applied    Home Living                      Prior Function            PT Goals (current goals can now be found in the care plan section) Progress towards PT goals: Progressing toward goals    Frequency    7X/week      PT Plan Current plan remains appropriate    Co-evaluation              AM-PAC PT "6 Clicks" Daily Activity  Outcome Measure  Difficulty turning over in bed (including adjusting bedclothes, sheets and blankets)?: Unable Difficulty moving from lying on back to sitting on the side of the  bed? : Unable Difficulty sitting down on and standing up from a chair with arms (e.g., wheelchair, bedside commode, etc,.)?: Unable Help needed moving to and from a bed to chair (including a wheelchair)?: A Little Help needed walking in hospital room?: A Little Help needed climbing 3-5 steps with a railing? : A Little 6 Click Score: 12    End of Session Equipment Utilized During Treatment: Gait belt Activity Tolerance: Patient tolerated treatment well Patient left: in chair Nurse Communication: (pt ready for D/C to home ) PT Visit Diagnosis: Difficulty in walking, not elsewhere classified (R26.2)     Time: 2763-9432 PT Time Calculation (min) (ACUTE ONLY): 30 min  Charges:  $Gait Training: 8-22 mins $Therapeutic Exercise: 8-22 mins                    G Codes:       Rica Koyanagi  PTA [facility]  Acute  Rehab Pager      [phone]

## 2017-11-19 NOTE — Progress Notes (Signed)
72 yo M s/p R TKR. Hx of L TKR on 08/25/17. Received referral to assist pt with [facility] and DME. Met with pt and wife. He plans to return home with the support of his wife. He has DME (Cane, RW, and a 3-in-1 BSC) from previous surgery. He stated that she is scheduled to start outpt PT on Tuesday.

## 2017-11-19 NOTE — Progress Notes (Signed)
   Subjective:  Patient reports pain as mild.  Denies N/V/CP/SOB.  Objective:   VITALS:   Vitals:   11/18/17 1105 11/18/17 1332 11/18/17 2154 11/19/17 0512  BP: 136/76 (!) 161/81 (!) 141/75 130/71  Pulse: 70 70 80 72  Resp: 16 16 17 15   Temp: 97.8 F (36.6 C) (!) 97.5 F (36.4 C) 98.7 F (37.1 C) 98.5 F (36.9 C)  TempSrc: Oral Oral Oral Oral  SpO2: 96% 96% 96% 97%  Weight:      Height:       NAD ABD soft Sensation intact distally Intact pulses distally Dorsiflexion/Plantar flexion intact Incision: dressing C/D/I Compartment soft   Lab Results  Component Value Date   WBC 10.7 (H) 11/19/2017   HGB 8.8 (L) 11/19/2017   HCT 27.6 (L) 11/19/2017   MCV 91.1 11/19/2017   PLT 176 11/19/2017   BMET    Component Value Date/Time   NA 138 11/18/2017 0531   K 4.3 11/18/2017 0531   CL 107 11/18/2017 0531   CO2 27 11/18/2017 0531   GLUCOSE 122 (H) 11/18/2017 0531   BUN 26 (H) 11/18/2017 0531   CREATININE 1.80 (H) 11/18/2017 0531   CALCIUM 8.1 (L) 11/18/2017 0531   GFRNONAA 37 (L) 11/18/2017 0531   GFRAA 43 (L) 11/18/2017 0531     Assessment/Plan: 2 Days Post-Op   Principal Problem:   Osteoarthritis of right knee   WBAT with walker ASA, SCDs, TEDs for DVT ppx PO pain control PT/OT D/C home today   Nicholes Stairs 11/19/2017, 9:32 AM

## 2017-11-22 DIAGNOSIS — Z471 Aftercare following joint replacement surgery: Secondary | ICD-10-CM | POA: Diagnosis not present

## 2017-11-22 DIAGNOSIS — M6281 Muscle weakness (generalized): Secondary | ICD-10-CM | POA: Diagnosis not present

## 2017-11-22 DIAGNOSIS — Z96651 Presence of right artificial knee joint: Secondary | ICD-10-CM | POA: Diagnosis not present

## 2017-11-22 DIAGNOSIS — Z96652 Presence of left artificial knee joint: Secondary | ICD-10-CM | POA: Diagnosis not present

## 2017-11-22 DIAGNOSIS — R2689 Other abnormalities of gait and mobility: Secondary | ICD-10-CM | POA: Diagnosis not present

## 2017-11-22 DIAGNOSIS — M25661 Stiffness of right knee, not elsewhere classified: Secondary | ICD-10-CM | POA: Diagnosis not present

## 2017-11-23 DIAGNOSIS — M25661 Stiffness of right knee, not elsewhere classified: Secondary | ICD-10-CM | POA: Diagnosis not present

## 2017-11-23 DIAGNOSIS — Z96651 Presence of right artificial knee joint: Secondary | ICD-10-CM | POA: Diagnosis not present

## 2017-11-23 DIAGNOSIS — Z96652 Presence of left artificial knee joint: Secondary | ICD-10-CM | POA: Diagnosis not present

## 2017-11-23 DIAGNOSIS — R2689 Other abnormalities of gait and mobility: Secondary | ICD-10-CM | POA: Diagnosis not present

## 2017-11-23 DIAGNOSIS — M6281 Muscle weakness (generalized): Secondary | ICD-10-CM | POA: Diagnosis not present

## 2017-11-23 DIAGNOSIS — Z471 Aftercare following joint replacement surgery: Secondary | ICD-10-CM | POA: Diagnosis not present

## 2017-11-25 DIAGNOSIS — R2689 Other abnormalities of gait and mobility: Secondary | ICD-10-CM | POA: Diagnosis not present

## 2017-11-25 DIAGNOSIS — M6281 Muscle weakness (generalized): Secondary | ICD-10-CM | POA: Diagnosis not present

## 2017-11-25 DIAGNOSIS — Z471 Aftercare following joint replacement surgery: Secondary | ICD-10-CM | POA: Diagnosis not present

## 2017-11-25 DIAGNOSIS — M25661 Stiffness of right knee, not elsewhere classified: Secondary | ICD-10-CM | POA: Diagnosis not present

## 2017-11-25 DIAGNOSIS — Z96651 Presence of right artificial knee joint: Secondary | ICD-10-CM | POA: Diagnosis not present

## 2017-11-25 DIAGNOSIS — Z96652 Presence of left artificial knee joint: Secondary | ICD-10-CM | POA: Diagnosis not present

## 2017-11-28 DIAGNOSIS — R2689 Other abnormalities of gait and mobility: Secondary | ICD-10-CM | POA: Diagnosis not present

## 2017-11-28 DIAGNOSIS — M6281 Muscle weakness (generalized): Secondary | ICD-10-CM | POA: Diagnosis not present

## 2017-11-28 DIAGNOSIS — Z96651 Presence of right artificial knee joint: Secondary | ICD-10-CM | POA: Diagnosis not present

## 2017-11-28 DIAGNOSIS — Z96652 Presence of left artificial knee joint: Secondary | ICD-10-CM | POA: Diagnosis not present

## 2017-11-28 DIAGNOSIS — Z471 Aftercare following joint replacement surgery: Secondary | ICD-10-CM | POA: Diagnosis not present

## 2017-11-28 DIAGNOSIS — M25661 Stiffness of right knee, not elsewhere classified: Secondary | ICD-10-CM | POA: Diagnosis not present

## 2017-11-30 DIAGNOSIS — Z96652 Presence of left artificial knee joint: Secondary | ICD-10-CM | POA: Diagnosis not present

## 2017-11-30 DIAGNOSIS — M25661 Stiffness of right knee, not elsewhere classified: Secondary | ICD-10-CM | POA: Diagnosis not present

## 2017-11-30 DIAGNOSIS — Z471 Aftercare following joint replacement surgery: Secondary | ICD-10-CM | POA: Diagnosis not present

## 2017-11-30 DIAGNOSIS — R2689 Other abnormalities of gait and mobility: Secondary | ICD-10-CM | POA: Diagnosis not present

## 2017-11-30 DIAGNOSIS — Z96651 Presence of right artificial knee joint: Secondary | ICD-10-CM | POA: Diagnosis not present

## 2017-11-30 DIAGNOSIS — M6281 Muscle weakness (generalized): Secondary | ICD-10-CM | POA: Diagnosis not present

## 2017-12-02 DIAGNOSIS — M25661 Stiffness of right knee, not elsewhere classified: Secondary | ICD-10-CM | POA: Diagnosis not present

## 2017-12-02 DIAGNOSIS — Z96652 Presence of left artificial knee joint: Secondary | ICD-10-CM | POA: Diagnosis not present

## 2017-12-02 DIAGNOSIS — Z96651 Presence of right artificial knee joint: Secondary | ICD-10-CM | POA: Diagnosis not present

## 2017-12-02 DIAGNOSIS — Z471 Aftercare following joint replacement surgery: Secondary | ICD-10-CM | POA: Diagnosis not present

## 2017-12-02 DIAGNOSIS — R2689 Other abnormalities of gait and mobility: Secondary | ICD-10-CM | POA: Diagnosis not present

## 2017-12-02 DIAGNOSIS — M6281 Muscle weakness (generalized): Secondary | ICD-10-CM | POA: Diagnosis not present

## 2017-12-05 DIAGNOSIS — Z471 Aftercare following joint replacement surgery: Secondary | ICD-10-CM | POA: Diagnosis not present

## 2017-12-05 DIAGNOSIS — Z96651 Presence of right artificial knee joint: Secondary | ICD-10-CM | POA: Diagnosis not present

## 2017-12-05 DIAGNOSIS — M25661 Stiffness of right knee, not elsewhere classified: Secondary | ICD-10-CM | POA: Diagnosis not present

## 2017-12-05 DIAGNOSIS — M6281 Muscle weakness (generalized): Secondary | ICD-10-CM | POA: Diagnosis not present

## 2017-12-05 DIAGNOSIS — R2689 Other abnormalities of gait and mobility: Secondary | ICD-10-CM | POA: Diagnosis not present

## 2017-12-05 DIAGNOSIS — Z96652 Presence of left artificial knee joint: Secondary | ICD-10-CM | POA: Diagnosis not present

## 2017-12-06 DIAGNOSIS — Z96651 Presence of right artificial knee joint: Secondary | ICD-10-CM | POA: Diagnosis not present

## 2017-12-06 DIAGNOSIS — Z471 Aftercare following joint replacement surgery: Secondary | ICD-10-CM | POA: Diagnosis not present

## 2017-12-07 DIAGNOSIS — M6281 Muscle weakness (generalized): Secondary | ICD-10-CM | POA: Diagnosis not present

## 2017-12-07 DIAGNOSIS — Z471 Aftercare following joint replacement surgery: Secondary | ICD-10-CM | POA: Diagnosis not present

## 2017-12-07 DIAGNOSIS — Z96651 Presence of right artificial knee joint: Secondary | ICD-10-CM | POA: Diagnosis not present

## 2017-12-07 DIAGNOSIS — M25661 Stiffness of right knee, not elsewhere classified: Secondary | ICD-10-CM | POA: Diagnosis not present

## 2017-12-07 DIAGNOSIS — R2689 Other abnormalities of gait and mobility: Secondary | ICD-10-CM | POA: Diagnosis not present

## 2017-12-07 DIAGNOSIS — Z96652 Presence of left artificial knee joint: Secondary | ICD-10-CM | POA: Diagnosis not present

## 2017-12-09 DIAGNOSIS — R2689 Other abnormalities of gait and mobility: Secondary | ICD-10-CM | POA: Diagnosis not present

## 2017-12-09 DIAGNOSIS — Z96651 Presence of right artificial knee joint: Secondary | ICD-10-CM | POA: Diagnosis not present

## 2017-12-09 DIAGNOSIS — Z471 Aftercare following joint replacement surgery: Secondary | ICD-10-CM | POA: Diagnosis not present

## 2017-12-09 DIAGNOSIS — M25661 Stiffness of right knee, not elsewhere classified: Secondary | ICD-10-CM | POA: Diagnosis not present

## 2017-12-09 DIAGNOSIS — M6281 Muscle weakness (generalized): Secondary | ICD-10-CM | POA: Diagnosis not present

## 2017-12-09 DIAGNOSIS — Z96652 Presence of left artificial knee joint: Secondary | ICD-10-CM | POA: Diagnosis not present

## 2017-12-12 DIAGNOSIS — Z96652 Presence of left artificial knee joint: Secondary | ICD-10-CM | POA: Diagnosis not present

## 2017-12-12 DIAGNOSIS — Z96651 Presence of right artificial knee joint: Secondary | ICD-10-CM | POA: Diagnosis not present

## 2017-12-12 DIAGNOSIS — M6281 Muscle weakness (generalized): Secondary | ICD-10-CM | POA: Diagnosis not present

## 2017-12-12 DIAGNOSIS — R2689 Other abnormalities of gait and mobility: Secondary | ICD-10-CM | POA: Diagnosis not present

## 2017-12-12 DIAGNOSIS — M25661 Stiffness of right knee, not elsewhere classified: Secondary | ICD-10-CM | POA: Diagnosis not present

## 2017-12-12 DIAGNOSIS — Z471 Aftercare following joint replacement surgery: Secondary | ICD-10-CM | POA: Diagnosis not present

## 2017-12-14 DIAGNOSIS — M6281 Muscle weakness (generalized): Secondary | ICD-10-CM | POA: Diagnosis not present

## 2017-12-14 DIAGNOSIS — Z96651 Presence of right artificial knee joint: Secondary | ICD-10-CM | POA: Diagnosis not present

## 2017-12-14 DIAGNOSIS — Z96652 Presence of left artificial knee joint: Secondary | ICD-10-CM | POA: Diagnosis not present

## 2017-12-14 DIAGNOSIS — Z471 Aftercare following joint replacement surgery: Secondary | ICD-10-CM | POA: Diagnosis not present

## 2017-12-14 DIAGNOSIS — M25661 Stiffness of right knee, not elsewhere classified: Secondary | ICD-10-CM | POA: Diagnosis not present

## 2017-12-14 DIAGNOSIS — R2689 Other abnormalities of gait and mobility: Secondary | ICD-10-CM | POA: Diagnosis not present

## 2017-12-15 DIAGNOSIS — L110 Acquired keratosis follicularis: Secondary | ICD-10-CM | POA: Diagnosis not present

## 2017-12-15 DIAGNOSIS — E1140 Type 2 diabetes mellitus with diabetic neuropathy, unspecified: Secondary | ICD-10-CM | POA: Diagnosis not present

## 2017-12-15 DIAGNOSIS — B351 Tinea unguium: Secondary | ICD-10-CM | POA: Diagnosis not present

## 2017-12-15 DIAGNOSIS — E1151 Type 2 diabetes mellitus with diabetic peripheral angiopathy without gangrene: Secondary | ICD-10-CM | POA: Diagnosis not present

## 2017-12-16 DIAGNOSIS — Z471 Aftercare following joint replacement surgery: Secondary | ICD-10-CM | POA: Diagnosis not present

## 2017-12-16 DIAGNOSIS — M25661 Stiffness of right knee, not elsewhere classified: Secondary | ICD-10-CM | POA: Diagnosis not present

## 2017-12-16 DIAGNOSIS — R2689 Other abnormalities of gait and mobility: Secondary | ICD-10-CM | POA: Diagnosis not present

## 2017-12-16 DIAGNOSIS — Z96651 Presence of right artificial knee joint: Secondary | ICD-10-CM | POA: Diagnosis not present

## 2017-12-16 DIAGNOSIS — Z96652 Presence of left artificial knee joint: Secondary | ICD-10-CM | POA: Diagnosis not present

## 2017-12-16 DIAGNOSIS — M6281 Muscle weakness (generalized): Secondary | ICD-10-CM | POA: Diagnosis not present

## 2017-12-19 DIAGNOSIS — M6281 Muscle weakness (generalized): Secondary | ICD-10-CM | POA: Diagnosis not present

## 2017-12-19 DIAGNOSIS — M25661 Stiffness of right knee, not elsewhere classified: Secondary | ICD-10-CM | POA: Diagnosis not present

## 2017-12-19 DIAGNOSIS — Z471 Aftercare following joint replacement surgery: Secondary | ICD-10-CM | POA: Diagnosis not present

## 2017-12-19 DIAGNOSIS — Z96652 Presence of left artificial knee joint: Secondary | ICD-10-CM | POA: Diagnosis not present

## 2017-12-19 DIAGNOSIS — Z96651 Presence of right artificial knee joint: Secondary | ICD-10-CM | POA: Diagnosis not present

## 2017-12-19 DIAGNOSIS — R2689 Other abnormalities of gait and mobility: Secondary | ICD-10-CM | POA: Diagnosis not present

## 2017-12-21 DIAGNOSIS — Z96652 Presence of left artificial knee joint: Secondary | ICD-10-CM | POA: Diagnosis not present

## 2017-12-21 DIAGNOSIS — M25661 Stiffness of right knee, not elsewhere classified: Secondary | ICD-10-CM | POA: Diagnosis not present

## 2017-12-21 DIAGNOSIS — Z96651 Presence of right artificial knee joint: Secondary | ICD-10-CM | POA: Diagnosis not present

## 2017-12-21 DIAGNOSIS — M6281 Muscle weakness (generalized): Secondary | ICD-10-CM | POA: Diagnosis not present

## 2017-12-21 DIAGNOSIS — R2689 Other abnormalities of gait and mobility: Secondary | ICD-10-CM | POA: Diagnosis not present

## 2017-12-21 DIAGNOSIS — Z471 Aftercare following joint replacement surgery: Secondary | ICD-10-CM | POA: Diagnosis not present

## 2017-12-23 DIAGNOSIS — R2689 Other abnormalities of gait and mobility: Secondary | ICD-10-CM | POA: Diagnosis not present

## 2017-12-23 DIAGNOSIS — Z96652 Presence of left artificial knee joint: Secondary | ICD-10-CM | POA: Diagnosis not present

## 2017-12-23 DIAGNOSIS — M6281 Muscle weakness (generalized): Secondary | ICD-10-CM | POA: Diagnosis not present

## 2017-12-23 DIAGNOSIS — M25661 Stiffness of right knee, not elsewhere classified: Secondary | ICD-10-CM | POA: Diagnosis not present

## 2017-12-23 DIAGNOSIS — Z471 Aftercare following joint replacement surgery: Secondary | ICD-10-CM | POA: Diagnosis not present

## 2017-12-23 DIAGNOSIS — Z96651 Presence of right artificial knee joint: Secondary | ICD-10-CM | POA: Diagnosis not present

## 2017-12-27 DIAGNOSIS — M25661 Stiffness of right knee, not elsewhere classified: Secondary | ICD-10-CM | POA: Diagnosis not present

## 2017-12-27 DIAGNOSIS — Z96652 Presence of left artificial knee joint: Secondary | ICD-10-CM | POA: Diagnosis not present

## 2017-12-27 DIAGNOSIS — Z471 Aftercare following joint replacement surgery: Secondary | ICD-10-CM | POA: Diagnosis not present

## 2017-12-27 DIAGNOSIS — Z96651 Presence of right artificial knee joint: Secondary | ICD-10-CM | POA: Diagnosis not present

## 2017-12-27 DIAGNOSIS — M6281 Muscle weakness (generalized): Secondary | ICD-10-CM | POA: Diagnosis not present

## 2017-12-27 DIAGNOSIS — R2689 Other abnormalities of gait and mobility: Secondary | ICD-10-CM | POA: Diagnosis not present

## 2017-12-28 DIAGNOSIS — R2689 Other abnormalities of gait and mobility: Secondary | ICD-10-CM | POA: Diagnosis not present

## 2017-12-28 DIAGNOSIS — M6281 Muscle weakness (generalized): Secondary | ICD-10-CM | POA: Diagnosis not present

## 2017-12-28 DIAGNOSIS — M25661 Stiffness of right knee, not elsewhere classified: Secondary | ICD-10-CM | POA: Diagnosis not present

## 2017-12-28 DIAGNOSIS — Z471 Aftercare following joint replacement surgery: Secondary | ICD-10-CM | POA: Diagnosis not present

## 2017-12-28 DIAGNOSIS — Z96651 Presence of right artificial knee joint: Secondary | ICD-10-CM | POA: Diagnosis not present

## 2017-12-28 DIAGNOSIS — Z96652 Presence of left artificial knee joint: Secondary | ICD-10-CM | POA: Diagnosis not present

## 2017-12-29 DIAGNOSIS — I10 Essential (primary) hypertension: Secondary | ICD-10-CM | POA: Diagnosis not present

## 2017-12-29 DIAGNOSIS — Z471 Aftercare following joint replacement surgery: Secondary | ICD-10-CM | POA: Diagnosis not present

## 2017-12-29 DIAGNOSIS — M25661 Stiffness of right knee, not elsewhere classified: Secondary | ICD-10-CM | POA: Diagnosis not present

## 2017-12-29 DIAGNOSIS — Z96652 Presence of left artificial knee joint: Secondary | ICD-10-CM | POA: Diagnosis not present

## 2017-12-29 DIAGNOSIS — Z96651 Presence of right artificial knee joint: Secondary | ICD-10-CM | POA: Diagnosis not present

## 2017-12-29 DIAGNOSIS — M159 Polyosteoarthritis, unspecified: Secondary | ICD-10-CM | POA: Diagnosis not present

## 2017-12-29 DIAGNOSIS — M6281 Muscle weakness (generalized): Secondary | ICD-10-CM | POA: Diagnosis not present

## 2017-12-29 DIAGNOSIS — E7800 Pure hypercholesterolemia, unspecified: Secondary | ICD-10-CM | POA: Diagnosis not present

## 2017-12-29 DIAGNOSIS — R2689 Other abnormalities of gait and mobility: Secondary | ICD-10-CM | POA: Diagnosis not present

## 2018-01-03 DIAGNOSIS — Z96651 Presence of right artificial knee joint: Secondary | ICD-10-CM | POA: Diagnosis not present

## 2018-01-03 DIAGNOSIS — M1712 Unilateral primary osteoarthritis, left knee: Secondary | ICD-10-CM | POA: Diagnosis not present

## 2018-01-03 DIAGNOSIS — Z471 Aftercare following joint replacement surgery: Secondary | ICD-10-CM | POA: Diagnosis not present

## 2018-01-13 DIAGNOSIS — Z87891 Personal history of nicotine dependence: Secondary | ICD-10-CM | POA: Diagnosis not present

## 2018-01-13 DIAGNOSIS — Z6834 Body mass index (BMI) 34.0-34.9, adult: Secondary | ICD-10-CM | POA: Diagnosis not present

## 2018-01-13 DIAGNOSIS — E1122 Type 2 diabetes mellitus with diabetic chronic kidney disease: Secondary | ICD-10-CM | POA: Diagnosis not present

## 2018-01-13 DIAGNOSIS — J069 Acute upper respiratory infection, unspecified: Secondary | ICD-10-CM | POA: Diagnosis not present

## 2018-01-13 DIAGNOSIS — Z299 Encounter for prophylactic measures, unspecified: Secondary | ICD-10-CM | POA: Diagnosis not present

## 2018-01-13 DIAGNOSIS — J449 Chronic obstructive pulmonary disease, unspecified: Secondary | ICD-10-CM | POA: Diagnosis not present

## 2018-01-13 DIAGNOSIS — I429 Cardiomyopathy, unspecified: Secondary | ICD-10-CM | POA: Diagnosis not present

## 2018-01-13 DIAGNOSIS — I10 Essential (primary) hypertension: Secondary | ICD-10-CM | POA: Diagnosis not present

## 2018-01-13 DIAGNOSIS — C61 Malignant neoplasm of prostate: Secondary | ICD-10-CM | POA: Diagnosis not present

## 2018-01-13 DIAGNOSIS — E1165 Type 2 diabetes mellitus with hyperglycemia: Secondary | ICD-10-CM | POA: Diagnosis not present

## 2018-01-19 DIAGNOSIS — H35033 Hypertensive retinopathy, bilateral: Secondary | ICD-10-CM | POA: Diagnosis not present

## 2018-01-24 DIAGNOSIS — E7800 Pure hypercholesterolemia, unspecified: Secondary | ICD-10-CM | POA: Diagnosis not present

## 2018-01-24 DIAGNOSIS — M159 Polyosteoarthritis, unspecified: Secondary | ICD-10-CM | POA: Diagnosis not present

## 2018-01-24 DIAGNOSIS — I10 Essential (primary) hypertension: Secondary | ICD-10-CM | POA: Diagnosis not present

## 2018-03-02 DIAGNOSIS — E1151 Type 2 diabetes mellitus with diabetic peripheral angiopathy without gangrene: Secondary | ICD-10-CM | POA: Diagnosis not present

## 2018-03-02 DIAGNOSIS — B351 Tinea unguium: Secondary | ICD-10-CM | POA: Diagnosis not present

## 2018-03-02 DIAGNOSIS — E1140 Type 2 diabetes mellitus with diabetic neuropathy, unspecified: Secondary | ICD-10-CM | POA: Diagnosis not present

## 2018-03-02 DIAGNOSIS — L110 Acquired keratosis follicularis: Secondary | ICD-10-CM | POA: Diagnosis not present

## 2018-03-06 DIAGNOSIS — E1165 Type 2 diabetes mellitus with hyperglycemia: Secondary | ICD-10-CM | POA: Diagnosis not present

## 2018-03-06 DIAGNOSIS — Z87891 Personal history of nicotine dependence: Secondary | ICD-10-CM | POA: Diagnosis not present

## 2018-03-06 DIAGNOSIS — Z299 Encounter for prophylactic measures, unspecified: Secondary | ICD-10-CM | POA: Diagnosis not present

## 2018-03-06 DIAGNOSIS — J449 Chronic obstructive pulmonary disease, unspecified: Secondary | ICD-10-CM | POA: Diagnosis not present

## 2018-03-06 DIAGNOSIS — J069 Acute upper respiratory infection, unspecified: Secondary | ICD-10-CM | POA: Diagnosis not present

## 2018-03-06 DIAGNOSIS — C61 Malignant neoplasm of prostate: Secondary | ICD-10-CM | POA: Diagnosis not present

## 2018-03-06 DIAGNOSIS — N183 Chronic kidney disease, stage 3 (moderate): Secondary | ICD-10-CM | POA: Diagnosis not present

## 2018-03-06 DIAGNOSIS — Z6834 Body mass index (BMI) 34.0-34.9, adult: Secondary | ICD-10-CM | POA: Diagnosis not present

## 2018-03-06 DIAGNOSIS — I429 Cardiomyopathy, unspecified: Secondary | ICD-10-CM | POA: Diagnosis not present

## 2018-03-15 DIAGNOSIS — E7800 Pure hypercholesterolemia, unspecified: Secondary | ICD-10-CM | POA: Diagnosis not present

## 2018-03-15 DIAGNOSIS — M159 Polyosteoarthritis, unspecified: Secondary | ICD-10-CM | POA: Diagnosis not present

## 2018-03-15 DIAGNOSIS — I10 Essential (primary) hypertension: Secondary | ICD-10-CM | POA: Diagnosis not present

## 2018-04-21 DIAGNOSIS — M549 Dorsalgia, unspecified: Secondary | ICD-10-CM | POA: Diagnosis not present

## 2018-04-21 DIAGNOSIS — E1165 Type 2 diabetes mellitus with hyperglycemia: Secondary | ICD-10-CM | POA: Diagnosis not present

## 2018-04-21 DIAGNOSIS — N183 Chronic kidney disease, stage 3 (moderate): Secondary | ICD-10-CM | POA: Diagnosis not present

## 2018-04-21 DIAGNOSIS — E1122 Type 2 diabetes mellitus with diabetic chronic kidney disease: Secondary | ICD-10-CM | POA: Diagnosis not present

## 2018-04-21 DIAGNOSIS — Z299 Encounter for prophylactic measures, unspecified: Secondary | ICD-10-CM | POA: Diagnosis not present

## 2018-04-21 DIAGNOSIS — Z6835 Body mass index (BMI) 35.0-35.9, adult: Secondary | ICD-10-CM | POA: Diagnosis not present

## 2018-04-21 DIAGNOSIS — I10 Essential (primary) hypertension: Secondary | ICD-10-CM | POA: Diagnosis not present

## 2018-04-21 DIAGNOSIS — K429 Umbilical hernia without obstruction or gangrene: Secondary | ICD-10-CM | POA: Diagnosis not present

## 2018-05-09 DIAGNOSIS — I10 Essential (primary) hypertension: Secondary | ICD-10-CM | POA: Diagnosis not present

## 2018-05-09 DIAGNOSIS — I429 Cardiomyopathy, unspecified: Secondary | ICD-10-CM | POA: Diagnosis not present

## 2018-05-09 DIAGNOSIS — Z6834 Body mass index (BMI) 34.0-34.9, adult: Secondary | ICD-10-CM | POA: Diagnosis not present

## 2018-05-09 DIAGNOSIS — E1122 Type 2 diabetes mellitus with diabetic chronic kidney disease: Secondary | ICD-10-CM | POA: Diagnosis not present

## 2018-05-09 DIAGNOSIS — Z299 Encounter for prophylactic measures, unspecified: Secondary | ICD-10-CM | POA: Diagnosis not present

## 2018-05-09 DIAGNOSIS — J189 Pneumonia, unspecified organism: Secondary | ICD-10-CM | POA: Diagnosis not present

## 2018-05-23 DIAGNOSIS — E7800 Pure hypercholesterolemia, unspecified: Secondary | ICD-10-CM | POA: Diagnosis not present

## 2018-05-23 DIAGNOSIS — I10 Essential (primary) hypertension: Secondary | ICD-10-CM | POA: Diagnosis not present

## 2018-05-23 DIAGNOSIS — M159 Polyosteoarthritis, unspecified: Secondary | ICD-10-CM | POA: Diagnosis not present

## 2018-05-23 IMAGING — DX DG KNEE 1-2V PORT*R*
2 series · 2 of 2 positions shown · non-contrast
Comparison: None available in PACs

CLINICAL DATA: Status post right total knee joint prosthesis
placement.

EXAM:
PORTABLE RIGHT KNEE - 1-2 VIEW

[knee ap]
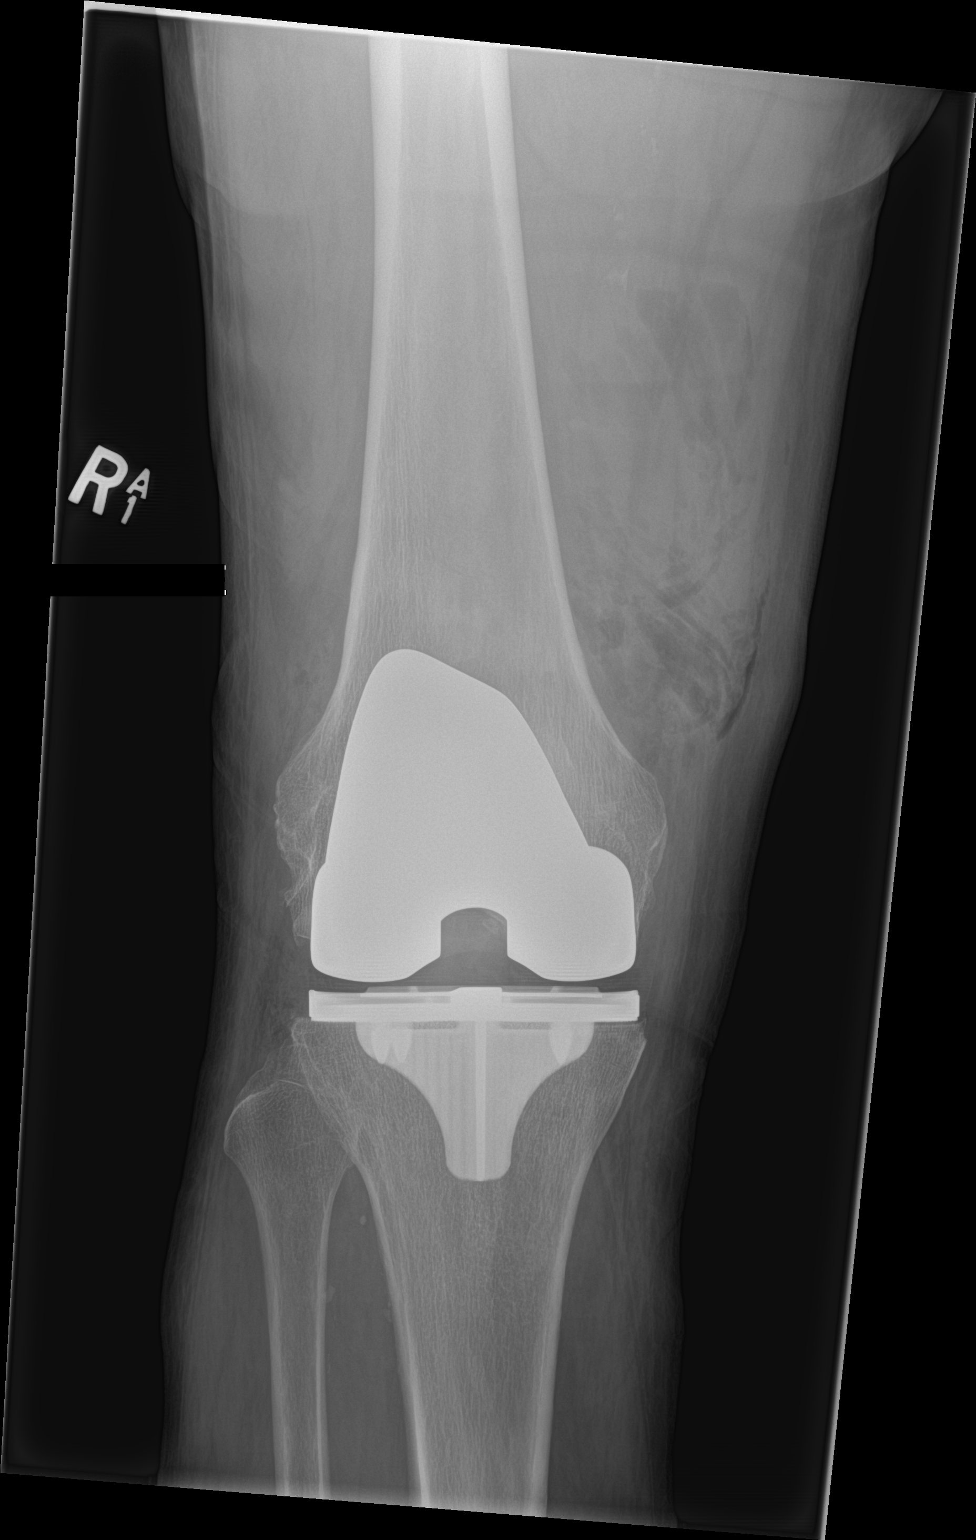

[knee lat]
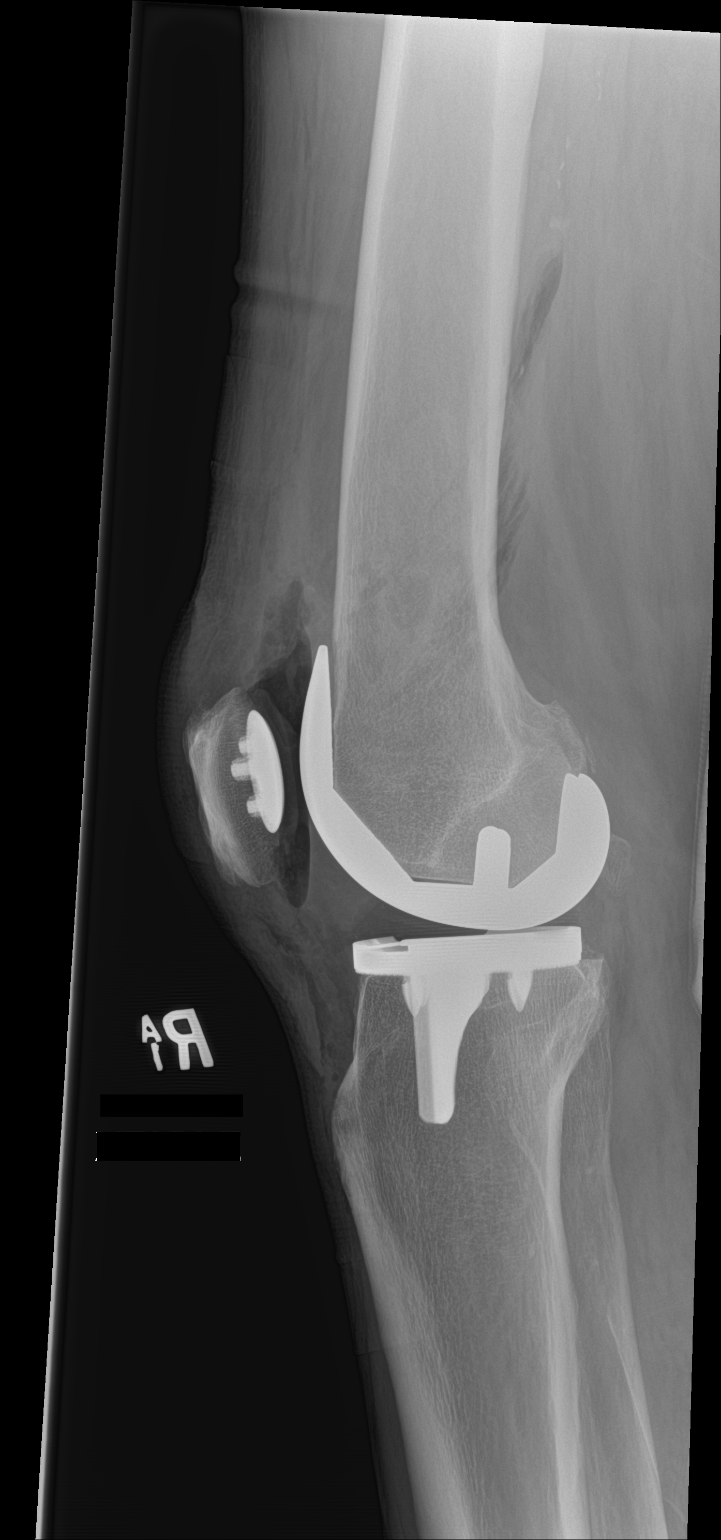

[2 of 2 positions shown; findings below may reference images not displayed]

FINDINGS: The patient has undergone total knee joint prosthesis placement.
Radiographic positioning of the prosthetic components is good. The
interface with the native bone appears normal. There is air and
fluid in the anterior aspect of the joint space.
IMPRESSION: No immediate postprocedure complication following right knee joint
prosthesis placement.

## 2018-05-26 DIAGNOSIS — Z299 Encounter for prophylactic measures, unspecified: Secondary | ICD-10-CM | POA: Diagnosis not present

## 2018-05-26 DIAGNOSIS — N183 Chronic kidney disease, stage 3 (moderate): Secondary | ICD-10-CM | POA: Diagnosis not present

## 2018-05-26 DIAGNOSIS — Z6834 Body mass index (BMI) 34.0-34.9, adult: Secondary | ICD-10-CM | POA: Diagnosis not present

## 2018-05-26 DIAGNOSIS — I10 Essential (primary) hypertension: Secondary | ICD-10-CM | POA: Diagnosis not present

## 2018-05-26 DIAGNOSIS — J449 Chronic obstructive pulmonary disease, unspecified: Secondary | ICD-10-CM | POA: Diagnosis not present

## 2018-05-26 DIAGNOSIS — J189 Pneumonia, unspecified organism: Secondary | ICD-10-CM | POA: Diagnosis not present

## 2018-06-01 DIAGNOSIS — E1140 Type 2 diabetes mellitus with diabetic neuropathy, unspecified: Secondary | ICD-10-CM | POA: Diagnosis not present

## 2018-06-01 DIAGNOSIS — B351 Tinea unguium: Secondary | ICD-10-CM | POA: Diagnosis not present

## 2018-06-01 DIAGNOSIS — E1151 Type 2 diabetes mellitus with diabetic peripheral angiopathy without gangrene: Secondary | ICD-10-CM | POA: Diagnosis not present

## 2018-06-01 DIAGNOSIS — L110 Acquired keratosis follicularis: Secondary | ICD-10-CM | POA: Diagnosis not present

## 2018-06-13 DIAGNOSIS — M170 Bilateral primary osteoarthritis of knee: Secondary | ICD-10-CM | POA: Diagnosis not present

## 2018-06-13 DIAGNOSIS — M1711 Unilateral primary osteoarthritis, right knee: Secondary | ICD-10-CM | POA: Diagnosis not present

## 2018-06-21 DIAGNOSIS — M159 Polyosteoarthritis, unspecified: Secondary | ICD-10-CM | POA: Diagnosis not present

## 2018-06-21 DIAGNOSIS — E7800 Pure hypercholesterolemia, unspecified: Secondary | ICD-10-CM | POA: Diagnosis not present

## 2018-06-21 DIAGNOSIS — I10 Essential (primary) hypertension: Secondary | ICD-10-CM | POA: Diagnosis not present

## 2018-07-28 DIAGNOSIS — E1122 Type 2 diabetes mellitus with diabetic chronic kidney disease: Secondary | ICD-10-CM | POA: Diagnosis not present

## 2018-07-28 DIAGNOSIS — I10 Essential (primary) hypertension: Secondary | ICD-10-CM | POA: Diagnosis not present

## 2018-07-28 DIAGNOSIS — I429 Cardiomyopathy, unspecified: Secondary | ICD-10-CM | POA: Diagnosis not present

## 2018-07-28 DIAGNOSIS — E1165 Type 2 diabetes mellitus with hyperglycemia: Secondary | ICD-10-CM | POA: Diagnosis not present

## 2018-07-28 DIAGNOSIS — Z23 Encounter for immunization: Secondary | ICD-10-CM | POA: Diagnosis not present

## 2018-07-28 DIAGNOSIS — Z299 Encounter for prophylactic measures, unspecified: Secondary | ICD-10-CM | POA: Diagnosis not present

## 2018-07-28 DIAGNOSIS — Z6834 Body mass index (BMI) 34.0-34.9, adult: Secondary | ICD-10-CM | POA: Diagnosis not present

## 2018-07-31 DIAGNOSIS — M159 Polyosteoarthritis, unspecified: Secondary | ICD-10-CM | POA: Diagnosis not present

## 2018-07-31 DIAGNOSIS — E7800 Pure hypercholesterolemia, unspecified: Secondary | ICD-10-CM | POA: Diagnosis not present

## 2018-07-31 DIAGNOSIS — I10 Essential (primary) hypertension: Secondary | ICD-10-CM | POA: Diagnosis not present

## 2018-08-17 DIAGNOSIS — E1140 Type 2 diabetes mellitus with diabetic neuropathy, unspecified: Secondary | ICD-10-CM | POA: Diagnosis not present

## 2018-08-17 DIAGNOSIS — B351 Tinea unguium: Secondary | ICD-10-CM | POA: Diagnosis not present

## 2018-08-17 DIAGNOSIS — L110 Acquired keratosis follicularis: Secondary | ICD-10-CM | POA: Diagnosis not present

## 2018-08-17 DIAGNOSIS — E1151 Type 2 diabetes mellitus with diabetic peripheral angiopathy without gangrene: Secondary | ICD-10-CM | POA: Diagnosis not present

## 2018-08-25 DIAGNOSIS — E7800 Pure hypercholesterolemia, unspecified: Secondary | ICD-10-CM | POA: Diagnosis not present

## 2018-08-25 DIAGNOSIS — I10 Essential (primary) hypertension: Secondary | ICD-10-CM | POA: Diagnosis not present

## 2018-08-25 DIAGNOSIS — M159 Polyosteoarthritis, unspecified: Secondary | ICD-10-CM | POA: Diagnosis not present

## 2018-09-12 DIAGNOSIS — Z299 Encounter for prophylactic measures, unspecified: Secondary | ICD-10-CM | POA: Diagnosis not present

## 2018-09-12 DIAGNOSIS — E7800 Pure hypercholesterolemia, unspecified: Secondary | ICD-10-CM | POA: Diagnosis not present

## 2018-09-12 DIAGNOSIS — I10 Essential (primary) hypertension: Secondary | ICD-10-CM | POA: Diagnosis not present

## 2018-09-12 DIAGNOSIS — N183 Chronic kidney disease, stage 3 (moderate): Secondary | ICD-10-CM | POA: Diagnosis not present

## 2018-09-12 DIAGNOSIS — J449 Chronic obstructive pulmonary disease, unspecified: Secondary | ICD-10-CM | POA: Diagnosis not present

## 2018-09-12 DIAGNOSIS — Z6835 Body mass index (BMI) 35.0-35.9, adult: Secondary | ICD-10-CM | POA: Diagnosis not present

## 2018-09-22 DIAGNOSIS — M159 Polyosteoarthritis, unspecified: Secondary | ICD-10-CM | POA: Diagnosis not present

## 2018-09-22 DIAGNOSIS — I10 Essential (primary) hypertension: Secondary | ICD-10-CM | POA: Diagnosis not present

## 2018-09-22 DIAGNOSIS — E7800 Pure hypercholesterolemia, unspecified: Secondary | ICD-10-CM | POA: Diagnosis not present

## 2018-10-12 DIAGNOSIS — Z87891 Personal history of nicotine dependence: Secondary | ICD-10-CM | POA: Diagnosis not present

## 2018-10-12 DIAGNOSIS — Z299 Encounter for prophylactic measures, unspecified: Secondary | ICD-10-CM | POA: Diagnosis not present

## 2018-10-12 DIAGNOSIS — I10 Essential (primary) hypertension: Secondary | ICD-10-CM | POA: Diagnosis not present

## 2018-10-12 DIAGNOSIS — J069 Acute upper respiratory infection, unspecified: Secondary | ICD-10-CM | POA: Diagnosis not present

## 2018-10-12 DIAGNOSIS — Z6836 Body mass index (BMI) 36.0-36.9, adult: Secondary | ICD-10-CM | POA: Diagnosis not present

## 2018-10-13 DIAGNOSIS — M159 Polyosteoarthritis, unspecified: Secondary | ICD-10-CM | POA: Diagnosis not present

## 2018-10-13 DIAGNOSIS — E7800 Pure hypercholesterolemia, unspecified: Secondary | ICD-10-CM | POA: Diagnosis not present

## 2018-10-13 DIAGNOSIS — I10 Essential (primary) hypertension: Secondary | ICD-10-CM | POA: Diagnosis not present

## 2018-10-18 ENCOUNTER — Ambulatory Visit (INDEPENDENT_AMBULATORY_CARE_PROVIDER_SITE_OTHER): Payer: Medicare Other | Admitting: Cardiology

## 2018-10-18 ENCOUNTER — Encounter: Payer: Self-pay | Admitting: Cardiology

## 2018-10-18 ENCOUNTER — Encounter: Payer: Self-pay | Admitting: *Deleted

## 2018-10-18 VITALS — BP 163/88 | HR 79 | Ht 74.0 in | Wt 281.4 lb

## 2018-10-18 DIAGNOSIS — E782 Mixed hyperlipidemia: Secondary | ICD-10-CM

## 2018-10-18 DIAGNOSIS — I428 Other cardiomyopathies: Secondary | ICD-10-CM | POA: Diagnosis not present

## 2018-10-18 DIAGNOSIS — I493 Ventricular premature depolarization: Secondary | ICD-10-CM

## 2018-10-18 DIAGNOSIS — I10 Essential (primary) hypertension: Secondary | ICD-10-CM | POA: Diagnosis not present

## 2018-10-18 NOTE — Progress Notes (Signed)
Clinical Summary Todd Robertson is a 72 y.o.male seen today for follow up of the following medical problems.   1. NICM  - previously significant LV dysfunction with LVEF 25-30% in 2004, last echo 2011 shows normalized LVEF at 60%. Cath 2004 with no significant coronary disease  - 08/2017 echo LVEF 55-60%   - has had recent SOB. Reports productive cough started after flu shot, has had some wheezing. Followed by Dr Manuella Ghazi, given abx and steroid with improvement for SOB/productive cough/wheezing - no recent edema - compliant with meds   2. HTN  - on steroids recently, notes some high bp's since that time - had been 120s/70s prior to starting steroids.    3. HL  -he is compliant with statin - labs next week with pcp   4. Prostate Cancer - treated radiation at [facility] - followed by urologist.   5. CKD - last Cr 1.79   6. PVCs - episode of NSVT during recent knee replacement - repeat echo 08/2017 showed stable LVEF 55-60%  - some palpitations on steroids, overall mild  Past Medical History:  Diagnosis Date  . Arthritis   . Cancer [facility])    prostate  . Cardiomyopathy    secondary  . CKD (chronic kidney disease)   . DM2 (diabetes mellitus, type 2) ([place])   . HTN (hypertension)    unspec  . Nonischemic cardiomyopathy ([place])    a. EF 40-50% by most recent 2-D echo b. EF 25% by cardiac cath in 2004  . Overweight(278.02)      No Known Allergies   Current Outpatient Medications  Medication Sig Dispense Refill  . allopurinol (ZYLOPRIM) 300 MG tablet Take 300 mg by mouth every morning.    Marland Kitchen aspirin 81 MG chewable tablet Chew 1 tablet (81 mg total) by mouth 2 (two) times daily. 60 tablet 1  . atorvastatin (LIPITOR) 40 MG tablet Take 1 tablet (40 mg total) by mouth daily. (Patient taking differently: Take 40 mg by mouth every evening. ) 90 tablet 3  . benazepril (LOTENSIN) 20 MG tablet Take 40 mg by mouth every morning.    . budesonide-formoterol  (SYMBICORT) 160-4.5 MCG/ACT inhaler Inhale 1 puff into the lungs daily. may take 2nd dose in the evening if needed for shortness of breath    . bumetanide (BUMEX) 1 MG tablet Take 1 mg by mouth daily.     . Carboxymethylcellul-Glycerin (LUBRICATING EYE DROPS OP) Apply 1 drop to eye daily.    . carvedilol (COREG) 25 MG tablet Take 1 tablet (25 mg total) by mouth 2 (two) times daily. 180 tablet 3  . cetirizine (ZYRTEC) 10 MG tablet Take 10 mg by mouth daily.    Marland Kitchen docusate sodium (COLACE) 100 MG capsule Take 1 capsule (100 mg total) by mouth 2 (two) times daily. (Patient taking differently: Take 100 mg by mouth daily. ) 60 capsule 1  . HYDROcodone-acetaminophen (NORCO) 7.5-325 MG tablet Take 1-2 tablets by mouth every 4 (four) hours as needed (knee pain). 60 tablet 0  . insulin aspart (FIASP) 100 UNIT/ML injection Inject 1-12 Units into the skin 3 (three) times daily as needed for high blood sugar. 150-200 units = 1 unit ,201-250 = 3 units , 251-300 = 5 units , 301-350 = 7 units, 351- 400 = 9 units, greater than 400 = 12 units    . Menthol-Methyl Salicylate (MUSCLE RUB) 10-15 % CREA Apply 1 application topically as needed for muscle pain.    Marland Kitchen ondansetron (ZOFRAN)  4 MG tablet Take 1 tablet (4 mg total) by mouth every 6 (six) hours as needed for nausea. 20 tablet 0  . senna (SENOKOT) 8.6 MG TABS tablet Take 2 tablets (17.2 mg total) by mouth at bedtime. (Patient not taking: Reported on 11/03/2017) 120 each 0   No current facility-administered medications for this visit.      Past Surgical History:  Procedure Laterality Date  . COLONOSCOPY    . HERNIA REPAIR    . KNEE ARTHROPLASTY Left 08/08/2017   Procedure: LEFT TOTAL KNEE ARTHROPLASTY WITH COMPUTER NAVIGATION;  Surgeon: Rod Can, MD;  Location: [facility];  Service: Orthopedics;  Laterality: Left;  Needs RNFA  . KNEE ARTHROPLASTY Right 11/17/2017   Procedure: RIGHT TOTAL KNEE ARTHROPLASTY WITH COMPUTER NAVIGATION;  Surgeon: Rod Can, MD;   Location: [facility];  Service: Orthopedics;  Laterality: Right;  NEEDS RNFA     No Known Allergies    Family History  Problem Relation Age of Onset  . Hypertension Mother      Social History Todd Robertson reports that he quit smoking about 33 years ago. His smoking use included cigarettes. He started smoking about 52 years ago. He has a 10.00 pack-year smoking history. He has never used smokeless tobacco. Todd Robertson reports that he does not drink alcohol.   Review of Systems CONSTITUTIONAL: No weight loss, fever, chills, weakness or fatigue.  HEENT: Eyes: No visual loss, blurred vision, double vision or yellow sclerae.No hearing loss, sneezing, congestion, runny nose or sore throat.  SKIN: No rash or itching.  CARDIOVASCULAR: per hpi RESPIRATORY: per hpi GASTROINTESTINAL: No anorexia, nausea, vomiting or diarrhea. No abdominal pain or blood.  GENITOURINARY: No burning on urination, no polyuria NEUROLOGICAL: No headache, dizziness, syncope, paralysis, ataxia, numbness or tingling in the extremities. No change in bowel or bladder control.  MUSCULOSKELETAL: No muscle, back pain, joint pain or stiffness.  LYMPHATICS: No enlarged nodes. No history of splenectomy.  PSYCHIATRIC: No history of depression or anxiety.  ENDOCRINOLOGIC: No reports of sweating, cold or heat intolerance. No polyuria or polydipsia.  Marland Kitchen   Physical Examination Vitals:   10/18/18 1418  BP: (!) 163/88  Pulse: 79  SpO2: 97%   Vitals:   10/18/18 1418  Weight: 281 lb 6.4 oz (127.6 kg)  Height: 6\' 2"  (1.88 m)    Gen: resting comfortably, no acute distress HEENT: no scleral icterus, pupils equal round and reactive, no palptable cervical adenopathy,  CV: RRR, no m/r/g, no jvd Resp: Clear to auscultation bilaterally GI: abdomen is soft, non-tender, non-distended, normal bowel sounds, no hepatosplenomegaly MSK: extremities are warm, no edema.  Skin: warm, no rash Neuro:  no focal deficits Psych: appropriate  affect    Assessment and Plan  1. NICM  - previously significant LV systolic dysfunction, that has since normalized on medical therapy  -no recent cardiac symptoms, continue to monitor.   2. HTN  - had been at goal, recently elevated since starting steroids for respiratory symptoms - continue to monitor.   3. HL  - request labs from pcp, continue statin   4. PVCs - some increase in symptoms since starting steroids, overall mild - follow as he comes off steroids.  - EKG today shows SR, occasional PVCs     Arnoldo Lenis, M.D.

## 2018-10-18 NOTE — Patient Instructions (Addendum)

## 2018-10-25 DIAGNOSIS — R5383 Other fatigue: Secondary | ICD-10-CM | POA: Diagnosis not present

## 2018-10-25 DIAGNOSIS — E7800 Pure hypercholesterolemia, unspecified: Secondary | ICD-10-CM | POA: Diagnosis not present

## 2018-10-25 DIAGNOSIS — Z1331 Encounter for screening for depression: Secondary | ICD-10-CM | POA: Diagnosis not present

## 2018-10-25 DIAGNOSIS — Z6835 Body mass index (BMI) 35.0-35.9, adult: Secondary | ICD-10-CM | POA: Diagnosis not present

## 2018-10-25 DIAGNOSIS — Z125 Encounter for screening for malignant neoplasm of prostate: Secondary | ICD-10-CM | POA: Diagnosis not present

## 2018-10-25 DIAGNOSIS — Z79899 Other long term (current) drug therapy: Secondary | ICD-10-CM | POA: Diagnosis not present

## 2018-10-25 DIAGNOSIS — Z7189 Other specified counseling: Secondary | ICD-10-CM | POA: Diagnosis not present

## 2018-10-25 DIAGNOSIS — Z1211 Encounter for screening for malignant neoplasm of colon: Secondary | ICD-10-CM | POA: Diagnosis not present

## 2018-10-25 DIAGNOSIS — Z299 Encounter for prophylactic measures, unspecified: Secondary | ICD-10-CM | POA: Diagnosis not present

## 2018-10-25 DIAGNOSIS — Z1339 Encounter for screening examination for other mental health and behavioral disorders: Secondary | ICD-10-CM | POA: Diagnosis not present

## 2018-10-25 DIAGNOSIS — I700 Atherosclerosis of aorta: Secondary | ICD-10-CM | POA: Diagnosis not present

## 2018-10-25 DIAGNOSIS — J449 Chronic obstructive pulmonary disease, unspecified: Secondary | ICD-10-CM | POA: Diagnosis not present

## 2018-10-25 DIAGNOSIS — I517 Cardiomegaly: Secondary | ICD-10-CM | POA: Diagnosis not present

## 2018-10-25 DIAGNOSIS — R05 Cough: Secondary | ICD-10-CM | POA: Diagnosis not present

## 2018-10-25 DIAGNOSIS — I429 Cardiomyopathy, unspecified: Secondary | ICD-10-CM | POA: Diagnosis not present

## 2018-10-25 DIAGNOSIS — Z0000 Encounter for general adult medical examination without abnormal findings: Secondary | ICD-10-CM | POA: Diagnosis not present

## 2018-10-26 DIAGNOSIS — B351 Tinea unguium: Secondary | ICD-10-CM | POA: Diagnosis not present

## 2018-10-26 DIAGNOSIS — E1151 Type 2 diabetes mellitus with diabetic peripheral angiopathy without gangrene: Secondary | ICD-10-CM | POA: Diagnosis not present

## 2018-10-26 DIAGNOSIS — L110 Acquired keratosis follicularis: Secondary | ICD-10-CM | POA: Diagnosis not present

## 2018-10-26 DIAGNOSIS — E1140 Type 2 diabetes mellitus with diabetic neuropathy, unspecified: Secondary | ICD-10-CM | POA: Diagnosis not present

## 2018-11-15 DIAGNOSIS — I10 Essential (primary) hypertension: Secondary | ICD-10-CM | POA: Diagnosis not present

## 2018-11-15 DIAGNOSIS — E7800 Pure hypercholesterolemia, unspecified: Secondary | ICD-10-CM | POA: Diagnosis not present

## 2018-11-15 DIAGNOSIS — M159 Polyosteoarthritis, unspecified: Secondary | ICD-10-CM | POA: Diagnosis not present

## 2018-11-23 DIAGNOSIS — J438 Other emphysema: Secondary | ICD-10-CM | POA: Diagnosis not present

## 2018-11-27 DIAGNOSIS — Z6835 Body mass index (BMI) 35.0-35.9, adult: Secondary | ICD-10-CM | POA: Diagnosis not present

## 2018-11-27 DIAGNOSIS — N183 Chronic kidney disease, stage 3 (moderate): Secondary | ICD-10-CM | POA: Diagnosis not present

## 2018-11-27 DIAGNOSIS — Z299 Encounter for prophylactic measures, unspecified: Secondary | ICD-10-CM | POA: Diagnosis not present

## 2018-11-27 DIAGNOSIS — E1122 Type 2 diabetes mellitus with diabetic chronic kidney disease: Secondary | ICD-10-CM | POA: Diagnosis not present

## 2018-11-27 DIAGNOSIS — J449 Chronic obstructive pulmonary disease, unspecified: Secondary | ICD-10-CM | POA: Diagnosis not present

## 2018-11-27 DIAGNOSIS — Z87891 Personal history of nicotine dependence: Secondary | ICD-10-CM | POA: Diagnosis not present

## 2018-11-27 DIAGNOSIS — I10 Essential (primary) hypertension: Secondary | ICD-10-CM | POA: Diagnosis not present

## 2018-12-12 DIAGNOSIS — I428 Other cardiomyopathies: Secondary | ICD-10-CM | POA: Diagnosis not present

## 2018-12-12 DIAGNOSIS — E1165 Type 2 diabetes mellitus with hyperglycemia: Secondary | ICD-10-CM | POA: Diagnosis not present

## 2018-12-12 DIAGNOSIS — J449 Chronic obstructive pulmonary disease, unspecified: Secondary | ICD-10-CM | POA: Diagnosis not present

## 2018-12-12 DIAGNOSIS — Z87891 Personal history of nicotine dependence: Secondary | ICD-10-CM | POA: Diagnosis not present

## 2018-12-12 DIAGNOSIS — Z299 Encounter for prophylactic measures, unspecified: Secondary | ICD-10-CM | POA: Diagnosis not present

## 2018-12-12 DIAGNOSIS — I10 Essential (primary) hypertension: Secondary | ICD-10-CM | POA: Diagnosis not present

## 2018-12-12 DIAGNOSIS — Z6836 Body mass index (BMI) 36.0-36.9, adult: Secondary | ICD-10-CM | POA: Diagnosis not present

## 2018-12-27 DIAGNOSIS — E7800 Pure hypercholesterolemia, unspecified: Secondary | ICD-10-CM | POA: Diagnosis not present

## 2018-12-27 DIAGNOSIS — M159 Polyosteoarthritis, unspecified: Secondary | ICD-10-CM | POA: Diagnosis not present

## 2018-12-27 DIAGNOSIS — I10 Essential (primary) hypertension: Secondary | ICD-10-CM | POA: Diagnosis not present

## 2019-01-05 DIAGNOSIS — Z471 Aftercare following joint replacement surgery: Secondary | ICD-10-CM | POA: Diagnosis not present

## 2019-01-05 DIAGNOSIS — Z96651 Presence of right artificial knee joint: Secondary | ICD-10-CM | POA: Diagnosis not present

## 2019-01-11 DIAGNOSIS — L110 Acquired keratosis follicularis: Secondary | ICD-10-CM | POA: Diagnosis not present

## 2019-01-11 DIAGNOSIS — E1140 Type 2 diabetes mellitus with diabetic neuropathy, unspecified: Secondary | ICD-10-CM | POA: Diagnosis not present

## 2019-01-11 DIAGNOSIS — B351 Tinea unguium: Secondary | ICD-10-CM | POA: Diagnosis not present

## 2019-01-11 DIAGNOSIS — E1151 Type 2 diabetes mellitus with diabetic peripheral angiopathy without gangrene: Secondary | ICD-10-CM | POA: Diagnosis not present

## 2019-01-23 DIAGNOSIS — I10 Essential (primary) hypertension: Secondary | ICD-10-CM | POA: Diagnosis not present

## 2019-01-23 DIAGNOSIS — E7800 Pure hypercholesterolemia, unspecified: Secondary | ICD-10-CM | POA: Diagnosis not present

## 2019-01-23 DIAGNOSIS — M159 Polyosteoarthritis, unspecified: Secondary | ICD-10-CM | POA: Diagnosis not present

## 2019-02-26 DIAGNOSIS — I10 Essential (primary) hypertension: Secondary | ICD-10-CM | POA: Diagnosis not present

## 2019-02-26 DIAGNOSIS — M159 Polyosteoarthritis, unspecified: Secondary | ICD-10-CM | POA: Diagnosis not present

## 2019-02-26 DIAGNOSIS — E7800 Pure hypercholesterolemia, unspecified: Secondary | ICD-10-CM | POA: Diagnosis not present

## 2019-03-26 DIAGNOSIS — M159 Polyosteoarthritis, unspecified: Secondary | ICD-10-CM | POA: Diagnosis not present

## 2019-03-26 DIAGNOSIS — E7800 Pure hypercholesterolemia, unspecified: Secondary | ICD-10-CM | POA: Diagnosis not present

## 2019-03-26 DIAGNOSIS — I10 Essential (primary) hypertension: Secondary | ICD-10-CM | POA: Diagnosis not present

## 2019-03-29 DIAGNOSIS — E1151 Type 2 diabetes mellitus with diabetic peripheral angiopathy without gangrene: Secondary | ICD-10-CM | POA: Diagnosis not present

## 2019-03-29 DIAGNOSIS — E1140 Type 2 diabetes mellitus with diabetic neuropathy, unspecified: Secondary | ICD-10-CM | POA: Diagnosis not present

## 2019-03-29 DIAGNOSIS — B351 Tinea unguium: Secondary | ICD-10-CM | POA: Diagnosis not present

## 2019-03-29 DIAGNOSIS — L110 Acquired keratosis follicularis: Secondary | ICD-10-CM | POA: Diagnosis not present

## 2019-05-02 DIAGNOSIS — I10 Essential (primary) hypertension: Secondary | ICD-10-CM | POA: Diagnosis not present

## 2019-05-02 DIAGNOSIS — E7800 Pure hypercholesterolemia, unspecified: Secondary | ICD-10-CM | POA: Diagnosis not present

## 2019-05-02 DIAGNOSIS — M159 Polyosteoarthritis, unspecified: Secondary | ICD-10-CM | POA: Diagnosis not present

## 2019-05-14 DIAGNOSIS — N183 Chronic kidney disease, stage 3 (moderate): Secondary | ICD-10-CM | POA: Diagnosis not present

## 2019-05-14 DIAGNOSIS — E1122 Type 2 diabetes mellitus with diabetic chronic kidney disease: Secondary | ICD-10-CM | POA: Diagnosis not present

## 2019-05-14 DIAGNOSIS — Z6835 Body mass index (BMI) 35.0-35.9, adult: Secondary | ICD-10-CM | POA: Diagnosis not present

## 2019-05-14 DIAGNOSIS — E1165 Type 2 diabetes mellitus with hyperglycemia: Secondary | ICD-10-CM | POA: Diagnosis not present

## 2019-05-14 DIAGNOSIS — I10 Essential (primary) hypertension: Secondary | ICD-10-CM | POA: Diagnosis not present

## 2019-05-14 DIAGNOSIS — J449 Chronic obstructive pulmonary disease, unspecified: Secondary | ICD-10-CM | POA: Diagnosis not present

## 2019-05-14 DIAGNOSIS — Z299 Encounter for prophylactic measures, unspecified: Secondary | ICD-10-CM | POA: Diagnosis not present

## 2019-05-29 DIAGNOSIS — M159 Polyosteoarthritis, unspecified: Secondary | ICD-10-CM | POA: Diagnosis not present

## 2019-05-29 DIAGNOSIS — I10 Essential (primary) hypertension: Secondary | ICD-10-CM | POA: Diagnosis not present

## 2019-05-29 DIAGNOSIS — E7800 Pure hypercholesterolemia, unspecified: Secondary | ICD-10-CM | POA: Diagnosis not present

## 2019-06-14 DIAGNOSIS — E1140 Type 2 diabetes mellitus with diabetic neuropathy, unspecified: Secondary | ICD-10-CM | POA: Diagnosis not present

## 2019-06-14 DIAGNOSIS — L110 Acquired keratosis follicularis: Secondary | ICD-10-CM | POA: Diagnosis not present

## 2019-06-14 DIAGNOSIS — E11319 Type 2 diabetes mellitus with unspecified diabetic retinopathy without macular edema: Secondary | ICD-10-CM | POA: Diagnosis not present

## 2019-06-14 DIAGNOSIS — E1151 Type 2 diabetes mellitus with diabetic peripheral angiopathy without gangrene: Secondary | ICD-10-CM | POA: Diagnosis not present

## 2019-06-14 DIAGNOSIS — B351 Tinea unguium: Secondary | ICD-10-CM | POA: Diagnosis not present

## 2019-07-03 DIAGNOSIS — M159 Polyosteoarthritis, unspecified: Secondary | ICD-10-CM | POA: Diagnosis not present

## 2019-07-03 DIAGNOSIS — I10 Essential (primary) hypertension: Secondary | ICD-10-CM | POA: Diagnosis not present

## 2019-07-03 DIAGNOSIS — E7800 Pure hypercholesterolemia, unspecified: Secondary | ICD-10-CM | POA: Diagnosis not present

## 2019-07-31 DIAGNOSIS — M159 Polyosteoarthritis, unspecified: Secondary | ICD-10-CM | POA: Diagnosis not present

## 2019-07-31 DIAGNOSIS — I10 Essential (primary) hypertension: Secondary | ICD-10-CM | POA: Diagnosis not present

## 2019-07-31 DIAGNOSIS — E7800 Pure hypercholesterolemia, unspecified: Secondary | ICD-10-CM | POA: Diagnosis not present

## 2019-08-23 DIAGNOSIS — Z23 Encounter for immunization: Secondary | ICD-10-CM | POA: Diagnosis not present

## 2019-08-23 DIAGNOSIS — E1151 Type 2 diabetes mellitus with diabetic peripheral angiopathy without gangrene: Secondary | ICD-10-CM | POA: Diagnosis not present

## 2019-08-23 DIAGNOSIS — E1140 Type 2 diabetes mellitus with diabetic neuropathy, unspecified: Secondary | ICD-10-CM | POA: Diagnosis not present

## 2019-08-23 DIAGNOSIS — L110 Acquired keratosis follicularis: Secondary | ICD-10-CM | POA: Diagnosis not present

## 2019-08-23 DIAGNOSIS — B351 Tinea unguium: Secondary | ICD-10-CM | POA: Diagnosis not present

## 2019-08-27 DIAGNOSIS — M159 Polyosteoarthritis, unspecified: Secondary | ICD-10-CM | POA: Diagnosis not present

## 2019-08-27 DIAGNOSIS — E7800 Pure hypercholesterolemia, unspecified: Secondary | ICD-10-CM | POA: Diagnosis not present

## 2019-08-27 DIAGNOSIS — I10 Essential (primary) hypertension: Secondary | ICD-10-CM | POA: Diagnosis not present

## 2019-10-16 DIAGNOSIS — I10 Essential (primary) hypertension: Secondary | ICD-10-CM | POA: Diagnosis not present

## 2019-10-16 DIAGNOSIS — E7800 Pure hypercholesterolemia, unspecified: Secondary | ICD-10-CM | POA: Diagnosis not present

## 2019-10-16 DIAGNOSIS — M159 Polyosteoarthritis, unspecified: Secondary | ICD-10-CM | POA: Diagnosis not present

## 2019-11-15 DIAGNOSIS — E1151 Type 2 diabetes mellitus with diabetic peripheral angiopathy without gangrene: Secondary | ICD-10-CM | POA: Diagnosis not present

## 2019-11-15 DIAGNOSIS — B351 Tinea unguium: Secondary | ICD-10-CM | POA: Diagnosis not present

## 2019-11-15 DIAGNOSIS — L110 Acquired keratosis follicularis: Secondary | ICD-10-CM | POA: Diagnosis not present

## 2019-11-15 DIAGNOSIS — E1140 Type 2 diabetes mellitus with diabetic neuropathy, unspecified: Secondary | ICD-10-CM | POA: Diagnosis not present

## 2019-11-20 DIAGNOSIS — I10 Essential (primary) hypertension: Secondary | ICD-10-CM | POA: Diagnosis not present

## 2019-11-20 DIAGNOSIS — E7800 Pure hypercholesterolemia, unspecified: Secondary | ICD-10-CM | POA: Diagnosis not present

## 2019-11-20 DIAGNOSIS — M159 Polyosteoarthritis, unspecified: Secondary | ICD-10-CM | POA: Diagnosis not present

## 2019-11-21 DIAGNOSIS — N184 Chronic kidney disease, stage 4 (severe): Secondary | ICD-10-CM | POA: Diagnosis not present

## 2019-11-28 DIAGNOSIS — Z87891 Personal history of nicotine dependence: Secondary | ICD-10-CM | POA: Diagnosis not present

## 2019-11-28 DIAGNOSIS — Z299 Encounter for prophylactic measures, unspecified: Secondary | ICD-10-CM | POA: Diagnosis not present

## 2019-11-28 DIAGNOSIS — I10 Essential (primary) hypertension: Secondary | ICD-10-CM | POA: Diagnosis not present

## 2019-11-28 DIAGNOSIS — E1165 Type 2 diabetes mellitus with hyperglycemia: Secondary | ICD-10-CM | POA: Diagnosis not present

## 2019-11-28 DIAGNOSIS — N184 Chronic kidney disease, stage 4 (severe): Secondary | ICD-10-CM | POA: Diagnosis not present

## 2019-11-28 DIAGNOSIS — Z6835 Body mass index (BMI) 35.0-35.9, adult: Secondary | ICD-10-CM | POA: Diagnosis not present

## 2019-11-28 DIAGNOSIS — N2581 Secondary hyperparathyroidism of renal origin: Secondary | ICD-10-CM | POA: Diagnosis not present

## 2019-12-13 ENCOUNTER — Other Ambulatory Visit (HOSPITAL_COMMUNITY): Payer: Self-pay | Admitting: Nephrology

## 2019-12-13 ENCOUNTER — Other Ambulatory Visit: Payer: Self-pay | Admitting: Nephrology

## 2019-12-13 DIAGNOSIS — N189 Chronic kidney disease, unspecified: Secondary | ICD-10-CM

## 2019-12-13 DIAGNOSIS — I129 Hypertensive chronic kidney disease with stage 1 through stage 4 chronic kidney disease, or unspecified chronic kidney disease: Secondary | ICD-10-CM | POA: Diagnosis not present

## 2019-12-13 DIAGNOSIS — D638 Anemia in other chronic diseases classified elsewhere: Secondary | ICD-10-CM | POA: Diagnosis not present

## 2019-12-13 DIAGNOSIS — E6609 Other obesity due to excess calories: Secondary | ICD-10-CM | POA: Diagnosis not present

## 2019-12-13 DIAGNOSIS — R809 Proteinuria, unspecified: Secondary | ICD-10-CM | POA: Diagnosis not present

## 2019-12-13 DIAGNOSIS — E1122 Type 2 diabetes mellitus with diabetic chronic kidney disease: Secondary | ICD-10-CM | POA: Diagnosis not present

## 2019-12-13 DIAGNOSIS — E1129 Type 2 diabetes mellitus with other diabetic kidney complication: Secondary | ICD-10-CM | POA: Diagnosis not present

## 2019-12-13 DIAGNOSIS — I5032 Chronic diastolic (congestive) heart failure: Secondary | ICD-10-CM | POA: Diagnosis not present

## 2019-12-19 DIAGNOSIS — E7800 Pure hypercholesterolemia, unspecified: Secondary | ICD-10-CM | POA: Diagnosis not present

## 2019-12-19 DIAGNOSIS — M159 Polyosteoarthritis, unspecified: Secondary | ICD-10-CM | POA: Diagnosis not present

## 2019-12-19 DIAGNOSIS — I10 Essential (primary) hypertension: Secondary | ICD-10-CM | POA: Diagnosis not present

## 2019-12-25 ENCOUNTER — Ambulatory Visit (HOSPITAL_COMMUNITY)
Admission: RE | Admit: 2019-12-25 | Discharge: 2019-12-25 | Disposition: A | Discharge status: Home / Self Care | Payer: Medicare Other | Source: Ambulatory Visit | Attending: Nephrology | Admitting: Nephrology

## 2019-12-25 ENCOUNTER — Other Ambulatory Visit: Payer: Self-pay

## 2019-12-25 DIAGNOSIS — I129 Hypertensive chronic kidney disease with stage 1 through stage 4 chronic kidney disease, or unspecified chronic kidney disease: Secondary | ICD-10-CM | POA: Diagnosis not present

## 2019-12-25 DIAGNOSIS — E559 Vitamin D deficiency, unspecified: Secondary | ICD-10-CM | POA: Diagnosis not present

## 2019-12-25 DIAGNOSIS — E1129 Type 2 diabetes mellitus with other diabetic kidney complication: Secondary | ICD-10-CM | POA: Diagnosis not present

## 2019-12-25 DIAGNOSIS — D638 Anemia in other chronic diseases classified elsewhere: Secondary | ICD-10-CM | POA: Diagnosis not present

## 2019-12-25 DIAGNOSIS — Z79899 Other long term (current) drug therapy: Secondary | ICD-10-CM | POA: Diagnosis not present

## 2019-12-25 DIAGNOSIS — N189 Chronic kidney disease, unspecified: Secondary | ICD-10-CM | POA: Diagnosis not present

## 2019-12-25 DIAGNOSIS — R809 Proteinuria, unspecified: Secondary | ICD-10-CM | POA: Diagnosis not present

## 2019-12-25 DIAGNOSIS — E6609 Other obesity due to excess calories: Secondary | ICD-10-CM | POA: Diagnosis not present

## 2019-12-25 DIAGNOSIS — N281 Cyst of kidney, acquired: Secondary | ICD-10-CM | POA: Diagnosis not present

## 2019-12-25 DIAGNOSIS — E1122 Type 2 diabetes mellitus with diabetic chronic kidney disease: Secondary | ICD-10-CM | POA: Diagnosis not present

## 2019-12-25 DIAGNOSIS — I5032 Chronic diastolic (congestive) heart failure: Secondary | ICD-10-CM | POA: Diagnosis not present

## 2020-01-02 DIAGNOSIS — I5032 Chronic diastolic (congestive) heart failure: Secondary | ICD-10-CM | POA: Diagnosis not present

## 2020-01-02 DIAGNOSIS — N189 Chronic kidney disease, unspecified: Secondary | ICD-10-CM | POA: Diagnosis not present

## 2020-01-02 DIAGNOSIS — E559 Vitamin D deficiency, unspecified: Secondary | ICD-10-CM | POA: Diagnosis not present

## 2020-01-02 DIAGNOSIS — I129 Hypertensive chronic kidney disease with stage 1 through stage 4 chronic kidney disease, or unspecified chronic kidney disease: Secondary | ICD-10-CM | POA: Diagnosis not present

## 2020-01-02 DIAGNOSIS — E211 Secondary hyperparathyroidism, not elsewhere classified: Secondary | ICD-10-CM | POA: Diagnosis not present

## 2020-01-02 DIAGNOSIS — D638 Anemia in other chronic diseases classified elsewhere: Secondary | ICD-10-CM | POA: Diagnosis not present

## 2020-01-02 DIAGNOSIS — R809 Proteinuria, unspecified: Secondary | ICD-10-CM | POA: Diagnosis not present

## 2020-01-02 DIAGNOSIS — N2889 Other specified disorders of kidney and ureter: Secondary | ICD-10-CM | POA: Diagnosis not present

## 2020-01-02 DIAGNOSIS — E6609 Other obesity due to excess calories: Secondary | ICD-10-CM | POA: Diagnosis not present

## 2020-01-02 DIAGNOSIS — E1129 Type 2 diabetes mellitus with other diabetic kidney complication: Secondary | ICD-10-CM | POA: Diagnosis not present

## 2020-01-02 DIAGNOSIS — E1122 Type 2 diabetes mellitus with diabetic chronic kidney disease: Secondary | ICD-10-CM | POA: Diagnosis not present

## 2020-01-02 DIAGNOSIS — Z79899 Other long term (current) drug therapy: Secondary | ICD-10-CM | POA: Diagnosis not present

## 2020-01-09 ENCOUNTER — Ambulatory Visit: Payer: Medicare Other | Admitting: Urology

## 2020-01-11 ENCOUNTER — Ambulatory Visit (INDEPENDENT_AMBULATORY_CARE_PROVIDER_SITE_OTHER): Payer: Medicare Other | Admitting: Urology

## 2020-01-11 ENCOUNTER — Encounter: Payer: Self-pay | Admitting: Urology

## 2020-01-11 ENCOUNTER — Other Ambulatory Visit: Payer: Self-pay

## 2020-01-11 VITALS — BP 157/91 | HR 67 | Temp 97.9°F | Ht 74.0 in | Wt 272.0 lb

## 2020-01-11 DIAGNOSIS — N2889 Other specified disorders of kidney and ureter: Secondary | ICD-10-CM

## 2020-01-11 LAB — POCT URINALYSIS DIPSTICK
Blood, UA: NEGATIVE
Glucose, UA: NEGATIVE
Ketones, UA: NEGATIVE
Leukocytes, UA: NEGATIVE
Nitrite, UA: NEGATIVE
Protein, UA: NEGATIVE
Spec Grav, UA: 1.025 (ref 1.010–1.025)
Urobilinogen, UA: NEGATIVE E.U./dL — AB
pH, UA: 5 (ref 5.0–8.0)

## 2020-01-11 NOTE — Patient Instructions (Signed)
Renal Mass  A renal mass is a growth in the kidney. A renal mass may be found while performing an MRI, CT scan, or ultrasound for other problems of the abdomen. Certain types of cancers, infections, or injuries can cause a renal mass. A renal mass that is cancerous (malignant) may grow or spread quickly. Others are harmless (benign). What are common types of renal masses? Renal masses include:  Tumors. These may be cancerous (malignant) or noncancerous (benign). ? The most common type of kidney cancer is renal cell carcinoma. ? The most common benign tumors of the kidney include renal adenomas, oncocytomas, and angiomyolipoma (AML).  Cysts. These are fluid-filled sacs that form on or in the kidney. ? It is not always known what causes a cyst to develop in or on the kidney. ? Most kidney cysts do not cause symptoms and do not need to be treated. What type of testing might I need? Your health care provider may recommend that you have tests to diagnose the cause of your renal mass. The following tests may be done if a renal mass is found:  Physical exam.  Blood tests.  Urine tests.  Imaging tests, such as ultrasound, CT scan, or MRI.  Biopsy. This is a small sample that is removed from the renal mass and tested in a lab. The exact tests and how often they are done will depend on:  The size and appearance of the renal mass.  Risk factors or medical conditions that increase your risk for problems.  Any symptoms associated with the renal mass, or concerns that you have about it. Tests and physical exams may be done once, or they may be done regularly for a period of time. Tests and exams that are done regularly will help monitor whether the mass is growing and beginning to cause problems. What are common treatments for renal masses? Treatment is not always needed for this condition. Your health care provider may recommend careful monitoring (watchful waiting) and regular tests and exams.  Treatment will depend on the cause of the mass. Follow these instructions at home: What you need to do at home will depend on the cause of the mass. Follow the instructions that your health care provider gives to you. In general:  Take over-the-counter and prescription medicines only as told by your health care provider.  If you are prescribed an antibiotic medicine, take it as told by your health care provider. Do not stop taking the antibiotic even if you start to feel better.  Follow any restrictions that are given to you by your health care provider.  Keep all follow-up visits as told by your health care provider. This is important. ? You may need to see your health care provider once or twice a year to have CT scans and ultrasounds done. These tests will show if your renal mass has changed or grown bigger. Contact a health care provider if you:  Have pain in the side or back (flank pain).  Have a fever.  Feel full soon after eating.  Have pain or swelling in the abdomen.  Lose weight. Get help right away if:  Your pain gets worse.  There is blood in your urine.  You cannot urinate.  You have chest pain.  You have trouble breathing. Summary  A renal mass is a growth in the kidney. It may be cancerous (malignant) and grow or spread quickly, or it may be harmless (benign).  Renal masses may be found while performing   an MRI, CT scan, or ultrasound for other problems of the abdomen.  Your health care provider may recommend that you have tests to diagnose the cause of your renal mass. This may include a physical exam, blood tests, urine tests, imaging, or a biopsy.  Treatment is not always needed for this condition. Careful monitoring (watchful waiting) may be recommended. This information is not intended to replace advice given to you by your health care provider. Make sure you discuss any questions you have with your health care provider. Document Revised: 12/01/2017  Document Reviewed: 12/01/2017 Elsevier Patient Education  2020 Elsevier Inc.  

## 2020-01-11 NOTE — Progress Notes (Signed)
Urological Symptom Review  Patient is experiencing the following symptoms: Leakage of urine  Erection problems Review of Systems  Gastrointestinal (upper)  : Negative for upper GI symptoms     Gastrointestinal (lower) : Negative for lower GI symptoms  Constitutional : Negative for symptoms  Skin: Negative for skin symptoms  Eyes: Negative for eye symptoms  Ear/Nose/Throat : Negative for Ear/Nose/Throat symptoms  Hematologic/Lymphatic: Negative for Hematologic/Lymphatic symptoms  Cardiovascular : Negative for cardiovascular symptoms  Respiratory : Cough  Endocrine: Negative for endocrine symptoms  Musculoskeletal: Back pain  Neurological: Dizziness   Psychologic: Negative for psychiatric symptoms

## 2020-01-11 NOTE — Progress Notes (Signed)
01/11/2020 2:29 PM   Todd Robertson 1946/10/07 [id number]  Referring provider: Monico Blitz, MD [address] [place],  [place] [address]  Right renal mass  HPI: Todd Robertson is a 74yo here for evaluation of a right renal mass. He underwent renal US for elevated creatiniene and was found to have a cystic 5cm right renal mass. No family hx of RCC.  No LUTS. Creatinine is 1.8    PMH: Past Medical History:  Diagnosis Date  . Arthritis   . Cancer [facility])    prostate  . Cardiomyopathy    secondary  . CKD (chronic kidney disease)   . DM2 (diabetes mellitus, type 2) ([place])   . Gout   . HTN (hypertension)    unspec  . Hypercholesterolemia   . Nonischemic cardiomyopathy ([place])    a. EF 40-50% by most recent 2-D echo b. EF 25% by cardiac cath in 2004  . Overweight(278.02)     Surgical History: Past Surgical History:  Procedure Laterality Date  . COLONOSCOPY    . HERNIA REPAIR    . KNEE ARTHROPLASTY Left 08/08/2017   Procedure: LEFT TOTAL KNEE ARTHROPLASTY WITH COMPUTER NAVIGATION;  Surgeon: Rod Can, MD;  Location: [employer];  Service: Orthopedics;  Laterality: Left;  Needs RNFA  . KNEE ARTHROPLASTY Right 11/17/2017   Procedure: RIGHT TOTAL KNEE ARTHROPLASTY WITH COMPUTER NAVIGATION;  Surgeon: Rod Can, MD;  Location: [facility];  Service: Orthopedics;  Laterality: Right;  NEEDS RNFA    Home Medications:  Allergies as of 01/11/2020   No Known Allergies     Medication List       Accurate as of January 11, 2020  2:29 PM. If you have any questions, ask your nurse or doctor.        acetaminophen 325 MG tablet Commonly known as: TYLENOL Take by mouth.   albuterol 108 (90 Base) MCG/ACT inhaler Commonly known as: VENTOLIN HFA Ventolin HFA 90 mcg/actuation aerosol inhaler   allopurinol 300 MG tablet Commonly known as: ZYLOPRIM Take 300 mg by mouth every morning.   aspirin 81 MG chewable tablet Chew 1 tablet (81 mg total) by mouth 2 (two) times daily.   atorvastatin 40 MG  tablet Commonly known as: LIPITOR Take 1 tablet (40 mg total) by mouth daily. What changed: when to take this   benazepril 20 MG tablet Commonly known as: LOTENSIN Take 40 mg by mouth every morning.   budesonide-formoterol 160-4.5 MCG/ACT inhaler Commonly known as: SYMBICORT Inhale 1 puff into the lungs daily. may take 2nd dose in the evening if needed for shortness of breath   bumetanide 1 MG tablet Commonly known as: BUMEX Take 1 mg by mouth daily.   carvedilol 25 MG tablet Commonly known as: COREG Take 1 tablet (25 mg total) by mouth 2 (two) times daily.   cetirizine 10 MG tablet Commonly known as: ZYRTEC Take 10 mg by mouth daily.   [place] [employer]  USE AS DIRECTED   docusate sodium 100 MG capsule Commonly known as: COLACE Take 1 capsule (100 mg total) by mouth 2 (two) times daily. What changed: when to take this   Fiasp 100 UNIT/ML injection Generic drug: insulin aspart Inject 1-12 Units into the skin 3 (three) times daily as needed for high blood sugar. 150-200 units = 1 unit ,201-250 = 3 units , 251-300 = 5 units , 301-350 = 7 units, 351- 400 = 9 units, greater than 400 = 12 units   HYDROcodone-acetaminophen 7.5-325  MG tablet Commonly known as: NORCO Take 1-2 tablets by mouth every 4 (four) hours as needed (knee pain).   LUBRICATING EYE DROPS OP Apply 1 drop to eye daily.   Mucinex 600 MG 12 hr tablet Generic drug: guaiFENesin Mucinex 600 mg tablet, extended release  TAKE 1 TABLET BY MOUTH TWICE DAILY   Muscle Rub 10-15 % Crea Apply 1 application topically as needed for muscle pain.   ondansetron 4 MG tablet Commonly known as: ZOFRAN Take 1 tablet (4 mg total) by mouth every 6 (six) hours as needed for nausea.   senna 8.6 MG Tabs tablet Commonly known as: SENOKOT Take 2 tablets (17.2 mg total) by mouth at bedtime.   tiZANidine 4 MG tablet Commonly known as: ZANAFLEX tizanidine 4 mg tablet       Allergies: No  Known Allergies  Family History: Family History  Problem Relation Age of Onset  . Hypertension Mother   . Kidney disease Mother   . Heart disease Father   . Kidney disease Other     Social History:  reports that he quit smoking about 35 years ago. His smoking use included cigarettes. He started smoking about 53 years ago. He has a 10.00 pack-year smoking history. He has never used smokeless tobacco. He reports that he does not drink alcohol or use drugs.  ROS: All other review of systems were reviewed and are negative except what is noted above in HPI  Physical Exam: BP (!) 157/91   Pulse 67   Temp 97.9 F (36.6 C)   Ht 6\' 2"  (1.88 m)   Wt 272 lb (123.4 kg)   BMI 34.92 kg/m   Constitutional:  Alert and oriented, No acute distress. HEENT: [place] AT, moist mucus membranes.  Trachea midline, no masses. Cardiovascular: No clubbing, cyanosis, or edema. Respiratory: Normal respiratory effort, no increased work of breathing. GI: Abdomen is soft, nontender, nondistended, no abdominal masses GU: No CVA tenderness Lymph: No cervical or inguinal lymphadenopathy. Skin: No rashes, bruises or suspicious lesions. Neurologic: Grossly intact, no focal deficits, moving all 4 extremities. Psychiatric: Normal mood and affect.  Laboratory Data: Lab Results  Component Value Date   WBC 10.7 (H) 11/19/2017   HGB 8.8 (L) 11/19/2017   HCT 27.6 (L) 11/19/2017   MCV 91.1 11/19/2017   PLT 176 11/19/2017    Lab Results  Component Value Date   CREATININE 1.80 (H) 11/18/2017    No results found for: PSA  No results found for: TESTOSTERONE  Lab Results  Component Value Date   HGBA1C 5.8 (H) 11/11/2017    Urinalysis    Component Value Date/Time   BILIRUBINUR small 01/11/2020 1413   PROTEINUR Negative 01/11/2020 1413   UROBILINOGEN negative (A) 01/11/2020 1413   NITRITE neg 01/11/2020 1413   LEUKOCYTESUR Negative 01/11/2020 1413    No results found for: LABMICR, [place], RBCUA, LABEPIT,  MUCUS, BACTERIA  Pertinent Imaging: Renal US: Images reviewed and discussed with the pagtient No results found for this or any previous visit. No results found for this or any previous visit. No results found for this or any previous visit. No results found for this or any previous visit. Results for orders placed during the hospital encounter of 12/25/19  US RENAL   Narrative CLINICAL DATA:  Chronic kidney disease.  EXAM: RENAL / URINARY TRACT ULTRASOUND COMPLETE  COMPARISON:  None.  FINDINGS: Right Kidney:  Renal measurements: 14.0 x 8.9 x 7.9 cm = volume: 511 mL. Complex avascular versus hypovascular mass in  the lower lateral kidney measures 5.7 x 2.4 x 5.8 cm. Additional 2.9 x 2.4 x 2.5 cm cyst is seen in the mid kidney. No hydronephrosis.  Left Kidney:  Renal measurements: 12.9 x 6.5 x 5.7 cm = volume: 252 mL. Echogenicity within normal limits. Cyst in the upper medial kidney measures 1.6 x 1.5 x 1.6 cm. No solid mass or hydronephrosis visualized.  Bladder:  Nondistended and not well evaluated.  Other:  None.  IMPRESSION: 1. Complex right renal mass measuring greater than 5 cm. This is incompletely characterized by sonography. Recommend renal protocol MRI (IV contrast if renal function permits). 2. Additional cysts in both kidneys. 3. Bladder nondistended and not evaluated.   Electronically Signed   By: Keith Rake M.D.   On: 12/25/2019 23:26    No results found for this or any previous visit. No results found for this or any previous visit. No results found for this or any previous visit.  Assessment & Plan:    1. Renal mass -BMP -CT abd w/wo -RTC 3-4 weeks - POCT urinalysis dipstick   No follow-ups on file.  Nicolette Bang, MD  [facility] [place]

## 2020-01-14 DIAGNOSIS — M159 Polyosteoarthritis, unspecified: Secondary | ICD-10-CM | POA: Diagnosis not present

## 2020-01-14 DIAGNOSIS — I10 Essential (primary) hypertension: Secondary | ICD-10-CM | POA: Diagnosis not present

## 2020-01-14 DIAGNOSIS — E7800 Pure hypercholesterolemia, unspecified: Secondary | ICD-10-CM | POA: Diagnosis not present

## 2020-01-22 DIAGNOSIS — E1140 Type 2 diabetes mellitus with diabetic neuropathy, unspecified: Secondary | ICD-10-CM | POA: Diagnosis not present

## 2020-01-22 DIAGNOSIS — L110 Acquired keratosis follicularis: Secondary | ICD-10-CM | POA: Diagnosis not present

## 2020-01-22 DIAGNOSIS — B351 Tinea unguium: Secondary | ICD-10-CM | POA: Diagnosis not present

## 2020-01-22 DIAGNOSIS — E1151 Type 2 diabetes mellitus with diabetic peripheral angiopathy without gangrene: Secondary | ICD-10-CM | POA: Diagnosis not present

## 2020-01-31 DIAGNOSIS — Z6835 Body mass index (BMI) 35.0-35.9, adult: Secondary | ICD-10-CM | POA: Diagnosis not present

## 2020-01-31 DIAGNOSIS — E1165 Type 2 diabetes mellitus with hyperglycemia: Secondary | ICD-10-CM | POA: Diagnosis not present

## 2020-01-31 DIAGNOSIS — E1122 Type 2 diabetes mellitus with diabetic chronic kidney disease: Secondary | ICD-10-CM | POA: Diagnosis not present

## 2020-01-31 DIAGNOSIS — I10 Essential (primary) hypertension: Secondary | ICD-10-CM | POA: Diagnosis not present

## 2020-01-31 DIAGNOSIS — I428 Other cardiomyopathies: Secondary | ICD-10-CM | POA: Diagnosis not present

## 2020-01-31 DIAGNOSIS — Z299 Encounter for prophylactic measures, unspecified: Secondary | ICD-10-CM | POA: Diagnosis not present

## 2020-01-31 DIAGNOSIS — Z87891 Personal history of nicotine dependence: Secondary | ICD-10-CM | POA: Diagnosis not present

## 2020-02-01 ENCOUNTER — Ambulatory Visit (HOSPITAL_COMMUNITY)
Admission: RE | Admit: 2020-02-01 | Discharge: 2020-02-01 | Disposition: A | Discharge status: Home / Self Care | Payer: Medicare Other | Source: Ambulatory Visit | Attending: Urology | Admitting: Urology

## 2020-02-01 ENCOUNTER — Telehealth: Payer: Self-pay

## 2020-02-01 ENCOUNTER — Other Ambulatory Visit: Payer: Self-pay | Admitting: Urology

## 2020-02-01 ENCOUNTER — Other Ambulatory Visit: Payer: Self-pay

## 2020-02-01 DIAGNOSIS — N2889 Other specified disorders of kidney and ureter: Secondary | ICD-10-CM

## 2020-02-01 LAB — POCT I-STAT CREATININE: Creatinine, Ser: 3 mg/dL — ABNORMAL HIGH (ref 0.61–1.24)

## 2020-02-01 MED ORDER — IOHEXOL 300 MG/ML  SOLN: 100.0000 mL | Freq: Once | INTRAMUSCULAR | Status: DC | PRN

## 2020-02-01 NOTE — Telephone Encounter (Signed)
April with CT called. Pt creatinine was elevated and unable to have contrast. Spoke with Dr. Alyson Ingles who verbalized pt to have CT stone study in place of CT with and w/o

## 2020-02-06 ENCOUNTER — Ambulatory Visit: Payer: Medicare Other | Admitting: Urology

## 2020-02-12 DIAGNOSIS — I10 Essential (primary) hypertension: Secondary | ICD-10-CM | POA: Diagnosis not present

## 2020-02-12 DIAGNOSIS — M159 Polyosteoarthritis, unspecified: Secondary | ICD-10-CM | POA: Diagnosis not present

## 2020-02-12 DIAGNOSIS — E7800 Pure hypercholesterolemia, unspecified: Secondary | ICD-10-CM | POA: Diagnosis not present

## 2020-02-13 ENCOUNTER — Encounter: Payer: Self-pay | Admitting: Urology

## 2020-02-13 ENCOUNTER — Other Ambulatory Visit: Payer: Self-pay

## 2020-02-13 ENCOUNTER — Ambulatory Visit (INDEPENDENT_AMBULATORY_CARE_PROVIDER_SITE_OTHER): Payer: Medicare Other | Admitting: Urology

## 2020-02-13 VITALS — BP 167/80 | HR 69 | Temp 98.1°F | Ht 74.0 in | Wt 276.0 lb

## 2020-02-13 DIAGNOSIS — N2889 Other specified disorders of kidney and ureter: Secondary | ICD-10-CM

## 2020-02-13 LAB — POCT URINALYSIS DIPSTICK
Bilirubin, UA: NEGATIVE
Blood, UA: NEGATIVE
Glucose, UA: NEGATIVE
Leukocytes, UA: NEGATIVE
Nitrite, UA: NEGATIVE
Protein, UA: NEGATIVE
Spec Grav, UA: 1.01 (ref 1.010–1.025)
Urobilinogen, UA: 0.2 E.U./dL
pH, UA: 5 (ref 5.0–8.0)

## 2020-02-13 NOTE — Progress Notes (Signed)
02/13/2020 11:36 AM   Todd Robertson 1946/09/02 [id number]  Referring provider: Monico Blitz, MD [address] [place],  [place] [address]  Right renal mass  HPI: Todd Robertson is a 74yo here for followup for a right renal mass. He underwent CT which showed a 6cm medial solid renal mass. No hematuria. No LUTS.    PMH: Past Medical History:  Diagnosis Date  . Arthritis   . Cancer [facility])    prostate  . Cardiomyopathy    secondary  . CKD (chronic kidney disease)   . DM2 (diabetes mellitus, type 2) ([place])   . Gout   . HTN (hypertension)    unspec  . Hypercholesterolemia   . Nonischemic cardiomyopathy ([place])    a. EF 40-50% by most recent 2-D echo b. EF 25% by cardiac cath in 2004  . Overweight(278.02)     Surgical History: Past Surgical History:  Procedure Laterality Date  . COLONOSCOPY    . HERNIA REPAIR    . KNEE ARTHROPLASTY Left 08/08/2017   Procedure: LEFT TOTAL KNEE ARTHROPLASTY WITH COMPUTER NAVIGATION;  Surgeon: Rod Can, MD;  Location: [facility];  Service: Orthopedics;  Laterality: Left;  Needs RNFA  . KNEE ARTHROPLASTY Right 11/17/2017   Procedure: RIGHT TOTAL KNEE ARTHROPLASTY WITH COMPUTER NAVIGATION;  Surgeon: Rod Can, MD;  [facility];  Service: Orthopedics;  Laterality: Right;  NEEDS RNFA    Home Medications:  Allergies as of 02/13/2020   No Known Allergies     Medication List       Accurate as of February 13, 2020 11:36 AM. If you have any questions, ask your nurse or doctor.        acetaminophen 325 MG tablet Commonly known as: TYLENOL Take by mouth.   albuterol 108 (90 Base) MCG/ACT inhaler Commonly known as: VENTOLIN HFA Ventolin HFA 90 mcg/actuation aerosol inhaler   allopurinol 300 MG tablet Commonly known as: ZYLOPRIM Take 300 mg by mouth every morning.   aspirin 81 MG chewable tablet Chew 1 tablet (81 mg total) by mouth 2 (two) times daily.   atorvastatin 40 MG tablet Commonly known as: LIPITOR Take 1 tablet (40 mg total) by  mouth daily. What changed: when to take this   benazepril 20 MG tablet Commonly known as: LOTENSIN Take 40 mg by mouth every morning.   budesonide-formoterol 160-4.5 MCG/ACT inhaler Commonly known as: SYMBICORT Inhale 1 puff into the lungs daily. may take 2nd dose in the evening if needed for shortness of breath   bumetanide 1 MG tablet Commonly known as: BUMEX Take 1 mg by mouth daily.   carvedilol 25 MG tablet Commonly known as: COREG Take 1 tablet (25 mg total) by mouth 2 (two) times daily.   cetirizine 10 MG tablet Commonly known as: ZYRTEC Take 10 mg by mouth daily.   [place] Space Chamber  USE AS DIRECTED   docusate sodium 100 MG capsule Commonly known as: COLACE Take 1 capsule (100 mg total) by mouth 2 (two) times daily. What changed: when to take this   Fiasp 100 UNIT/ML injection Generic drug: insulin aspart Inject 1-12 Units into the skin 3 (three) times daily as needed for high blood sugar. 150-200 units = 1 unit ,201-250 = 3 units , 251-300 = 5 units , 301-350 = 7 units, 351- 400 = 9 units, greater than 400 = 12 units   HYDROcodone-acetaminophen 7.5-325 MG tablet Commonly known as: NORCO Take 1-2 tablets by mouth every 4 (four) hours  as needed (knee pain).   LUBRICATING EYE DROPS OP Apply 1 drop to eye daily.   Mucinex 600 MG 12 hr tablet Generic drug: guaiFENesin Mucinex 600 mg tablet, extended release  TAKE 1 TABLET BY MOUTH TWICE DAILY   multivitamin tablet Take 1 tablet by mouth daily.   Muscle Rub 10-15 % Crea Apply 1 application topically as needed for muscle pain.   ondansetron 4 MG tablet Commonly known as: ZOFRAN Take 1 tablet (4 mg total) by mouth every 6 (six) hours as needed for nausea.   senna 8.6 MG Tabs tablet Commonly known as: SENOKOT Take 2 tablets (17.2 mg total) by mouth at bedtime.   tiZANidine 4 MG tablet Commonly known as: ZANAFLEX tizanidine 4 mg tablet       Allergies: No Known  Allergies  Family History: Family History  Problem Relation Age of Onset  . Hypertension Mother   . Kidney disease Mother   . Heart disease Father   . Kidney disease Other     Social History:  reports that he quit smoking about 35 years ago. His smoking use included cigarettes. He started smoking about 54 years ago. He has a 10.00 pack-year smoking history. He has never used smokeless tobacco. He reports that he does not drink alcohol or use drugs.  ROS: All other review of systems were reviewed and are negative except what is noted above in HPI  Physical Exam: BP (!) 167/80   Pulse 69   Temp 98.1 F (36.7 C)   Ht 6\' 2"  (1.88 m)   Wt 276 lb (125.2 kg)   BMI 35.44 kg/m   Constitutional:  Alert and oriented, No acute distress. HEENT: [place] AT, moist mucus membranes.  Trachea midline, no masses. Cardiovascular: No clubbing, cyanosis, or edema. Respiratory: Normal respiratory effort, no increased work of breathing. GI: Abdomen is soft, nontender, nondistended, no abdominal masses GU: No CVA tenderness.  Lymph: No cervical or inguinal lymphadenopathy. Skin: No rashes, bruises or suspicious lesions. Neurologic: Grossly intact, no focal deficits, moving all 4 extremities. Psychiatric: Normal mood and affect.  Laboratory Data: Lab Results  Component Value Date   WBC 10.7 (H) 11/19/2017   HGB 8.8 (L) 11/19/2017   HCT 27.6 (L) 11/19/2017   MCV 91.1 11/19/2017   PLT 176 11/19/2017    Lab Results  Component Value Date   CREATININE 3.00 (H) 02/01/2020    No results found for: PSA  No results found for: TESTOSTERONE  Lab Results  Component Value Date   HGBA1C 5.8 (H) 11/11/2017    Urinalysis    Component Value Date/Time   BILIRUBINUR neg 02/13/2020 1122   PROTEINUR Negative 02/13/2020 1122   UROBILINOGEN 0.2 02/13/2020 1122   NITRITE neg 02/13/2020 1122   LEUKOCYTESUR Negative 02/13/2020 1122    No results found for: LABMICR, [place], RBCUA, LABEPIT, MUCUS,  BACTERIA  Pertinent Imaging: CT stone study 3/27: Images reviewed and discussed the patient and family No results found for this or any previous visit. No results found for this or any previous visit. No results found for this or any previous visit. No results found for this or any previous visit. Results for orders placed during the hospital encounter of 12/25/19  US RENAL   Narrative CLINICAL DATA:  Chronic kidney disease.  EXAM: RENAL / URINARY TRACT ULTRASOUND COMPLETE  COMPARISON:  None.  FINDINGS: Right Kidney:  Renal measurements: 14.0 x 8.9 x 7.9 cm = volume: 511 mL. Complex avascular versus hypovascular mass in the lower  lateral kidney measures 5.7 x 2.4 x 5.8 cm. Additional 2.9 x 2.4 x 2.5 cm cyst is seen in the mid kidney. No hydronephrosis.  Left Kidney:  Renal measurements: 12.9 x 6.5 x 5.7 cm = volume: 252 mL. Echogenicity within normal limits. Cyst in the upper medial kidney measures 1.6 x 1.5 x 1.6 cm. No solid mass or hydronephrosis visualized.  Bladder:  Nondistended and not well evaluated.  Other:  None.  IMPRESSION: 1. Complex right renal mass measuring greater than 5 cm. This is incompletely characterized by sonography. Recommend renal protocol MRI (IV contrast if renal function permits). 2. Additional cysts in both kidneys. 3. Bladder nondistended and not evaluated.   Electronically Signed   By: Keith Rake M.D.   On: 12/25/2019 23:26    No results found for this or any previous visit. No results found for this or any previous visit. Results for orders placed during the hospital encounter of 02/01/20  CT RENAL STONE STUDY   Narrative CLINICAL DATA:  Indeterminate right renal mass on recent ultrasound. Renal insufficiency.  EXAM: CT ABDOMEN AND PELVIS WITHOUT CONTRAST  TECHNIQUE: Multidetector CT imaging of the abdomen and pelvis was performed following the standard protocol without IV contrast.  COMPARISON:  12/25/2019  renal sonogram.  FINDINGS: Lower chest: No significant pulmonary nodules or acute consolidative airspace disease. Coronary atherosclerosis.  Hepatobiliary: Normal liver size. No liver mass. Normal gallbladder with no radiopaque cholelithiasis. No biliary ductal dilatation.  Pancreas: Normal, with no mass or duct dilation.  Spleen: Normal size. No mass.  Adrenals/Urinary Tract: Normal adrenals. Soft tissue density partially exophytic 6.4 x 5.4 cm renal cortical mass in the medial upper right kidney (series 2/image 24). Simple 2.8 cm posterior interpolar right renal cyst. Simple 1.7 cm anterior lower left renal cyst. No renal stones. No hydronephrosis. Normal caliber ureters. No ureteral stones. Normal bladder.  Stomach/Bowel: Normal non-distended stomach. Normal caliber small bowel with no small bowel wall thickening. Normal appendix. Scattered minimal colonic diverticulosis with no large bowel wall thickening or significant pericolonic fat stranding.  Vascular/Lymphatic: Atherosclerotic nonaneurysmal abdominal aorta. No pathologically enlarged lymph nodes in the abdomen or pelvis.  Reproductive: Top-normal size prostate. Fiducial markers surrounding the prostate  Other: No pneumoperitoneum, ascites or focal fluid collection. Small fat containing umbilical hernia.  Musculoskeletal: No aggressive appearing focal osseous lesions. Marked lumbar spondylosis.  IMPRESSION: 1. Soft tissue density partially exophytic 6.4 cm renal cortical mass in the medial upper right kidney, incompletely characterized on this noncontrast study (patient unable to receive IV contrast due to GFR below 30). Renal cell carcinoma is to be excluded. If the patient's renal function improves (GFR greater than or equal to 30), renal mass protocol MRI (preferred) or CT abdomen without and with IV contrast is indicated for further evaluation. 2. No evidence of metastatic disease in the abdomen or pelvis  on this noncontrast CT study. 3. Coronary atherosclerosis. 4. Aortic Atherosclerosis (ICD10-I70.0).   Electronically Signed   By: Ilona Sorrel M.D.   On: 02/02/2020 16:52     Assessment & Plan:    1. Renal mass -We discussed the natural hx of renal masses and the various etiologies. Due to has poor renal function he will likely need dialysis. We will obtain a biopsy of his renal mass prior to scheduling definitive therapy.  - POCT urinalysis dipstick   No follow-ups on file.  Nicolette Bang, MD  [facility] [place]

## 2020-02-13 NOTE — Progress Notes (Signed)

## 2020-02-13 NOTE — Patient Instructions (Signed)
Renal Mass  A renal mass is a growth in the kidney. A renal mass may be found while performing an MRI, CT scan, or ultrasound for other problems of the abdomen. Certain types of cancers, infections, or injuries can cause a renal mass. A renal mass that is cancerous (malignant) may grow or spread quickly. Others are harmless (benign). What are common types of renal masses? Renal masses include:  Tumors. These may be cancerous (malignant) or noncancerous (benign). ? The most common type of kidney cancer is renal cell carcinoma. ? The most common benign tumors of the kidney include renal adenomas, oncocytomas, and angiomyolipoma (AML).  Cysts. These are fluid-filled sacs that form on or in the kidney. ? It is not always known what causes a cyst to develop in or on the kidney. ? Most kidney cysts do not cause symptoms and do not need to be treated. What type of testing might I need? Your health care provider may recommend that you have tests to diagnose the cause of your renal mass. The following tests may be done if a renal mass is found:  Physical exam.  Blood tests.  Urine tests.  Imaging tests, such as ultrasound, CT scan, or MRI.  Biopsy. This is a small sample that is removed from the renal mass and tested in a lab. The exact tests and how often they are done will depend on:  The size and appearance of the renal mass.  Risk factors or medical conditions that increase your risk for problems.  Any symptoms associated with the renal mass, or concerns that you have about it. Tests and physical exams may be done once, or they may be done regularly for a period of time. Tests and exams that are done regularly will help monitor whether the mass is growing and beginning to cause problems. What are common treatments for renal masses? Treatment is not always needed for this condition. Your health care provider may recommend careful monitoring (watchful waiting) and regular tests and exams.  Treatment will depend on the cause of the mass. Follow these instructions at home: What you need to do at home will depend on the cause of the mass. Follow the instructions that your health care provider gives to you. In general:  Take over-the-counter and prescription medicines only as told by your health care provider.  If you are prescribed an antibiotic medicine, take it as told by your health care provider. Do not stop taking the antibiotic even if you start to feel better.  Follow any restrictions that are given to you by your health care provider.  Keep all follow-up visits as told by your health care provider. This is important. ? You may need to see your health care provider once or twice a year to have CT scans and ultrasounds done. These tests will show if your renal mass has changed or grown bigger. Contact a health care provider if you:  Have pain in the side or back (flank pain).  Have a fever.  Feel full soon after eating.  Have pain or swelling in the abdomen.  Lose weight. Get help right away if:  Your pain gets worse.  There is blood in your urine.  You cannot urinate.  You have chest pain.  You have trouble breathing. Summary  A renal mass is a growth in the kidney. It may be cancerous (malignant) and grow or spread quickly, or it may be harmless (benign).  Renal masses may be found while performing   an MRI, CT scan, or ultrasound for other problems of the abdomen.  Your health care provider may recommend that you have tests to diagnose the cause of your renal mass. This may include a physical exam, blood tests, urine tests, imaging, or a biopsy.  Treatment is not always needed for this condition. Careful monitoring (watchful waiting) may be recommended. This information is not intended to replace advice given to you by your health care provider. Make sure you discuss any questions you have with your health care provider. Document Revised: 12/01/2017  Document Reviewed: 12/01/2017 Elsevier Patient Education  2020 Elsevier Inc.  

## 2020-02-21 ENCOUNTER — Encounter (HOSPITAL_COMMUNITY): Payer: Self-pay | Admitting: Radiology

## 2020-02-21 NOTE — Progress Notes (Signed)
Todd Robertson Male, 74 y.o., 29-Jun-1946 MRN:  [id number] Phone:  [phone] (H) PCP:  Monico Blitz, MD Primary Cvg:  Medicare/Medicare Part A And B Next Appt With Urology 03/19/2020 at 3:15 PM  RE: US Kidney Biopsy Received: Today Message Contents  Arne Cleveland, MD  Arlyn Leak   CT core RUP renal mass  [   DDH       Previous Messages   ----- Message -----  From: Garth Bigness D  Sent: 02/21/2020 12:16 PM EDT  To: Ir Procedure Requests  Subject: US Kidney Biopsy                 Procedure: US Kidney Biopsy   Reason: Renal Mass   History: [place], CT in computer   Provider: Cleon Gustin   Provider Contact: [phone]

## 2020-02-25 ENCOUNTER — Other Ambulatory Visit: Payer: Self-pay | Admitting: Student

## 2020-02-25 ENCOUNTER — Other Ambulatory Visit: Payer: Self-pay | Admitting: Radiology

## 2020-02-26 ENCOUNTER — Ambulatory Visit (HOSPITAL_COMMUNITY)
Admission: RE | Admit: 2020-02-26 | Discharge: 2020-02-26 | Disposition: A | Discharge status: Home / Self Care | Payer: Medicare Other | Source: Ambulatory Visit | Attending: Urology | Admitting: Urology

## 2020-02-26 ENCOUNTER — Encounter (HOSPITAL_COMMUNITY): Payer: Self-pay

## 2020-02-26 ENCOUNTER — Other Ambulatory Visit: Payer: Self-pay | Admitting: Anatomic Pathology & Clinical Pathology

## 2020-02-26 ENCOUNTER — Other Ambulatory Visit: Payer: Self-pay

## 2020-02-26 DIAGNOSIS — I129 Hypertensive chronic kidney disease with stage 1 through stage 4 chronic kidney disease, or unspecified chronic kidney disease: Secondary | ICD-10-CM | POA: Insufficient documentation

## 2020-02-26 DIAGNOSIS — N2889 Other specified disorders of kidney and ureter: Secondary | ICD-10-CM | POA: Diagnosis not present

## 2020-02-26 DIAGNOSIS — C641 Malignant neoplasm of right kidney, except renal pelvis: Secondary | ICD-10-CM | POA: Insufficient documentation

## 2020-02-26 DIAGNOSIS — E785 Hyperlipidemia, unspecified: Secondary | ICD-10-CM | POA: Insufficient documentation

## 2020-02-26 DIAGNOSIS — Z8249 Family history of ischemic heart disease and other diseases of the circulatory system: Secondary | ICD-10-CM | POA: Insufficient documentation

## 2020-02-26 DIAGNOSIS — E1122 Type 2 diabetes mellitus with diabetic chronic kidney disease: Secondary | ICD-10-CM | POA: Insufficient documentation

## 2020-02-26 DIAGNOSIS — N189 Chronic kidney disease, unspecified: Secondary | ICD-10-CM | POA: Diagnosis not present

## 2020-02-26 DIAGNOSIS — I428 Other cardiomyopathies: Secondary | ICD-10-CM | POA: Diagnosis not present

## 2020-02-26 DIAGNOSIS — Z79899 Other long term (current) drug therapy: Secondary | ICD-10-CM | POA: Insufficient documentation

## 2020-02-26 DIAGNOSIS — Z87891 Personal history of nicotine dependence: Secondary | ICD-10-CM | POA: Insufficient documentation

## 2020-02-26 DIAGNOSIS — Z7982 Long term (current) use of aspirin: Secondary | ICD-10-CM | POA: Diagnosis not present

## 2020-02-26 LAB — CBC
HCT: 38.3 % — ABNORMAL LOW (ref 39.0–52.0)
Hemoglobin: 11.9 g/dL — ABNORMAL LOW (ref 13.0–17.0)
MCH: 29.4 pg (ref 26.0–34.0)
MCHC: 31.1 g/dL (ref 30.0–36.0)
MCV: 94.6 fL (ref 80.0–100.0)
Platelets: 200 10*3/uL (ref 150–400)
RBC: 4.05 MIL/uL — ABNORMAL LOW (ref 4.22–5.81)
RDW: 15.3 % (ref 11.5–15.5)
WBC: 7.4 10*3/uL (ref 4.0–10.5)
nRBC: 0 % (ref 0.0–0.2)

## 2020-02-26 LAB — GLUCOSE, CAPILLARY: Glucose-Capillary: 115 mg/dL — ABNORMAL HIGH (ref 70–99)

## 2020-02-26 LAB — PROTIME-INR
INR: 1.1 (ref 0.8–1.2)
Prothrombin Time: 13.6 seconds (ref 11.4–15.2)

## 2020-02-26 MED ORDER — HYDROCODONE-ACETAMINOPHEN 5-325 MG PO TABS
1.0000 | ORAL_TABLET | ORAL | Status: DC | PRN
  Administered 2020-02-26: 2 via ORAL

## 2020-02-26 MED ORDER — SODIUM CHLORIDE 0.9 % IV SOLN: INTRAVENOUS | Status: DC

## 2020-02-26 MED ORDER — FENTANYL CITRATE (PF) 100 MCG/2ML IJ SOLN
INTRAMUSCULAR | Status: AC
  Filled 2020-02-26: qty 4

## 2020-02-26 MED ORDER — LIDOCAINE HCL 1 % IJ SOLN
INTRAMUSCULAR | Status: AC
  Filled 2020-02-26: qty 20

## 2020-02-26 MED ORDER — MIDAZOLAM HCL 2 MG/2ML IJ SOLN
INTRAMUSCULAR | Status: AC | PRN
  Administered 2020-02-26: 1 mg via INTRAVENOUS

## 2020-02-26 MED ORDER — LIDOCAINE-EPINEPHRINE 1 %-1:100000 IJ SOLN
INTRAMUSCULAR | Status: AC
  Filled 2020-02-26: qty 1

## 2020-02-26 MED ORDER — FENTANYL CITRATE (PF) 100 MCG/2ML IJ SOLN
INTRAMUSCULAR | Status: AC | PRN
  Administered 2020-02-26 (×2): 25 ug via INTRAVENOUS

## 2020-02-26 MED ORDER — HYDROCODONE-ACETAMINOPHEN 5-325 MG PO TABS
ORAL_TABLET | ORAL | Status: AC
  Filled 2020-02-26: qty 2

## 2020-02-26 MED ORDER — MIDAZOLAM HCL 2 MG/2ML IJ SOLN
INTRAMUSCULAR | Status: AC
  Filled 2020-02-26: qty 4

## 2020-02-26 NOTE — Procedures (Signed)
  Procedure: CT core RUL renal mass 18g EBL:   minimal Complications:  none immediate  See full dictation in [facility].  Dillard Cannon MD Main # [phone] Pager  [phone]

## 2020-02-26 NOTE — Discharge Instructions (Signed)
Percutaneous Kidney Biopsy, Care After This sheet gives you information about how to care for yourself after your procedure. Your health care provider may also give you more specific instructions. If you have problems or questions, contact your health care provider. What can I expect after the procedure? After the procedure, it is common to have:  Pain or soreness near the biopsy site.  Pink or cloudy urine for 24 hours after the procedure. Follow these instructions at home: Activity  Return to your normal activities as told by your health care provider. Ask your health care provider what activities are safe for you.  If you were given a sedative during the procedure, it can affect you for several hours. Do not drive or operate machinery until your health care provider says that it is safe.  Do not lift anything that is heavier than 10 lb (4.5 kg), or the limit that you are told, until your health care provider says that it is safe.  Avoid activities that take a lot of effort (are strenuous) until your health care provider approves. Most people will have to wait 2 weeks before returning to activities such as exercise or sex. General instructions   Take over-the-counter and prescription medicines only as told by your health care provider.  You may eat and drink after your procedure. Follow instructions from your health care provider about eating or drinking restrictions.  Check your biopsy site every day for signs of infection. Check for: ? More redness, swelling, or pain. ? Fluid or blood. ? Warmth. ? Pus or a bad smell.  Keep all follow-up visits as told by your health care provider. This is important. Contact a health care provider if:  You have more redness, swelling, or pain around your biopsy site.  You have fluid or blood coming from your biopsy site.  Your biopsy site feels warm to the touch.  You have pus or a bad smell coming from your biopsy site.  You have blood  in your urine more than 24 hours after your procedure.  You have a fever. Get help right away if:  Your urine is dark red or brown.  You cannot urinate.  It burns when you urinate.  You feel dizzy or light-headed.  You have severe pain in your abdomen or side. Summary  After the procedure, it is common to have pain or soreness at the biopsy site and pink or cloudy urine for the first 24 hours.  Check your biopsy site each day for signs of infection, such as more redness, swelling, or pain; fluid, blood, pus or a bad smell coming from the biopsy site; or the biopsy site feeling warm to touch.  Return to your normal activities as told by your health care provider. This information is not intended to replace advice given to you by your health care provider. Make sure you discuss any questions you have with your health care provider. Document Revised: 06/28/2019 Document Reviewed: 06/28/2019 Elsevier Patient Education  2020 Elsevier Inc. Moderate Conscious Sedation, Adult Sedation is the use of medicines to promote relaxation and relieve discomfort and anxiety. Moderate conscious sedation is a type of sedation. Under moderate conscious sedation, you are less alert than normal, but you are still able to respond to instructions, touch, or both. Moderate conscious sedation is used during short medical and dental procedures. It is milder than deep sedation, which is a type of sedation under which you cannot be easily woken up. It is also milder than   general anesthesia, which is the use of medicines to make you unconscious. Moderate conscious sedation allows you to return to your regular activities sooner. Tell a health care provider about:  Any allergies you have.  All medicines you are taking, including vitamins, herbs, eye drops, creams, and over-the-counter medicines.  Use of steroids (by mouth or creams).  Any problems you or family members have had with sedatives and anesthetic  medicines.  Any blood disorders you have.  Any surgeries you have had.  Any medical conditions you have, such as sleep apnea.  Whether you are pregnant or may be pregnant.  Any use of cigarettes, alcohol, marijuana, or street drugs. What are the risks? Generally, this is a safe procedure. However, problems may occur, including:  Getting too much medicine (oversedation).  Nausea.  Allergic reaction to medicines.  Trouble breathing. If this happens, a breathing tube may be used to help with breathing. It will be removed when you are awake and breathing on your own.  Heart trouble.  Lung trouble. What happens before the procedure? Staying hydrated Follow instructions from your health care provider about hydration, which may include:  Up to 2 hours before the procedure - you may continue to drink clear liquids, such as water, clear fruit juice, black coffee, and plain tea. Eating and drinking restrictions Follow instructions from your health care provider about eating and drinking, which may include:  8 hours before the procedure - stop eating heavy meals or foods such as meat, fried foods, or fatty foods.  6 hours before the procedure - stop eating light meals or foods, such as toast or cereal.  6 hours before the procedure - stop drinking milk or drinks that contain milk.  2 hours before the procedure - stop drinking clear liquids. Medicine Ask your health care provider about:  Changing or stopping your regular medicines. This is especially important if you are taking diabetes medicines or blood thinners.  Taking medicines such as aspirin and ibuprofen. These medicines can thin your blood. Do not take these medicines before your procedure if your health care provider instructs you not to.  Tests and exams  You will have a physical exam.  You may have blood tests done to show: ? How well your kidneys and liver are working. ? How well your blood can clot. General  instructions  Plan to have someone take you home from the hospital or clinic.  If you will be going home right after the procedure, plan to have someone with you for 24 hours. What happens during the procedure?  An IV tube will be inserted into one of your veins.  Medicine to help you relax (sedative) will be given through the IV tube.  The medical or dental procedure will be performed. What happens after the procedure?  Your blood pressure, heart rate, breathing rate, and blood oxygen level will be monitored often until the medicines you were given have worn off.  Do not drive for 24 hours. This information is not intended to replace advice given to you by your health care provider. Make sure you discuss any questions you have with your health care provider. Document Revised: 10/07/2017 Document Reviewed: 02/14/2016 Elsevier Patient Education  2020 Elsevier Inc.  

## 2020-02-26 NOTE — H&P (Signed)
Chief Complaint: Patient was seen in consultation today for right renal mass biopsy.  Referring Physician(s): McKenzie,Patrick L  Supervising Physician: Arne Cleveland  Patient Status: [facility] - Out-pt  History of Present Illness: Todd Robertson is a 74 y.o. male with a past medical history significant for arthritis, gout, DM, HTN, HLD, cardiomyopathy, prostate cancer, CKD and recently noted right renal mass followed by Dr. Alyson Ingles who presents today for a right renal mass biopsy. Todd Robertson began to have worsening kidney function earlier this year and a renal US was obtained by Dr. Theador Hawthorne which noted a complex right renal mass measuring >5 cm. He was referred to urology and underwent a CT renal stone study on 02/02/20 which showed a soft tissue density partially exophytic 6.4 cm renal cortical mass in the medial upper right kidney, incompletely characterized on noncontrast study, no evidence for metastatic disease. IR has been asked to perform a biopsy of this mass for further evaluation.  Todd Robertson denies any complaints today, he has been in his usual state of health. He states understanding of the requested procedure and is agreeable to proceed as planned.  Past Medical History:  Diagnosis Date  . Arthritis   . Cancer [facility])    prostate  . Cardiomyopathy    secondary  . CKD (chronic kidney disease)   . DM2 (diabetes mellitus, type 2) ([place])   . Gout   . HTN (hypertension)    unspec  . Hypercholesterolemia   . Nonischemic cardiomyopathy ([place])    a. EF 40-50% by most recent 2-D echo b. EF 25% by cardiac cath in 2004  . Overweight(278.02)     Past Surgical History:  Procedure Laterality Date  . COLONOSCOPY    . HERNIA REPAIR    . KNEE ARTHROPLASTY Left 08/08/2017   Procedure: LEFT TOTAL KNEE ARTHROPLASTY WITH COMPUTER NAVIGATION;  Surgeon: Rod Can, MD;  Location: [facility];  Service: Orthopedics;  Laterality: Left;  Needs RNFA  . KNEE ARTHROPLASTY Right 11/17/2017   Procedure: RIGHT TOTAL KNEE ARTHROPLASTY WITH COMPUTER NAVIGATION;  Surgeon: Rod Can, MD;  Location: [facility];  Service: Orthopedics;  Laterality: Right;  NEEDS RNFA    Allergies: Patient has no known allergies.  Medications: Prior to Admission medications   Medication Sig Start Date End Date Taking? Authorizing Provider  albuterol (VENTOLIN HFA) 108 (90 Base) MCG/ACT inhaler Inhale 1-2 puffs into the lungs every 6 (six) hours as needed for wheezing or shortness of breath.    Yes [provider]  allopurinol (ZYLOPRIM) 300 MG tablet Take 300 mg by mouth every morning.   Yes [provider]  aspirin 81 MG chewable tablet Chew 1 tablet (81 mg total) by mouth 2 (two) times daily. 11/18/17  Yes Swinteck, Aaron Edelman, MD  atorvastatin (LIPITOR) 40 MG tablet Take 1 tablet (40 mg total) by mouth daily. Patient taking differently: Take 40 mg by mouth every evening.  08/16/14  Yes BranchAlphonse Guild, MD  benazepril (LOTENSIN) 20 MG tablet Take 20 mg by mouth every morning.    Yes [provider]  budesonide-formoterol (SYMBICORT) 160-4.5 MCG/ACT inhaler Inhale 1 puff into the lungs daily. may take 2nd dose in the evening if needed for shortness of breath   Yes [provider]  bumetanide (BUMEX) 1 MG tablet Take 1 mg by mouth daily.    Yes [provider]  Carboxymethylcellul-Glycerin (LUBRICATING EYE DROPS OP) Apply 1 drop to eye daily.   Yes [provider]  carvedilol (COREG) 25 MG  tablet Take 1 tablet (25 mg total) by mouth 2 (two) times daily. 08/13/14  Yes BranchAlphonse Guild, MD  cetirizine (ZYRTEC) 10 MG tablet Take 10 mg by mouth daily.   Yes [provider]  guaiFENesin (MUCINEX) 600 MG 12 hr tablet Take 600 mg by mouth 2 (two) times daily as needed for cough.    Yes [provider]  Menthol-Methyl Salicylate (MUSCLE RUB) 10-15 % CREA Apply 1 application topically as needed for muscle pain.   Yes [provider]    Multiple Vitamin (MULTIVITAMIN) tablet Take 1 tablet by mouth daily.   Yes [provider]  Spacer/Aero-Holding Chambers (COMPACT SPACE CHAMBER) [place]  USE AS DIRECTED   Yes [provider]  tiZANidine (ZANAFLEX) 4 MG tablet Take 4 mg by mouth every 8 (eight) hours as needed for muscle spasms.    Yes [provider]  acetaminophen (TYLENOL) 325 MG tablet Take by mouth.    [provider]  ondansetron (ZOFRAN) 4 MG tablet Take 1 tablet (4 mg total) by mouth every 6 (six) hours as needed for nausea. Patient not taking: Reported on 01/11/2020 11/18/17   Rod Can, MD     Family History  Problem Relation Age of Onset  . Hypertension Mother   . Kidney disease Mother   . Heart disease Father   . Kidney disease Other     Social History   Socioeconomic History  . Marital status: Married    Spouse name: Not on file  . Number of children: 2  . Years of education: Not on file  . Highest education level: Not on file  Occupational History  . Occupation: retired  Tobacco Use  . Smoking status: Former Smoker    Packs/day: 0.50    Years: 20.00    Pack years: 10.00    Types: Cigarettes    Start date: 02/12/1966    Quit date: 11/08/1984    Years since quitting: 35.3  . Smokeless tobacco: Never Used  Substance and Sexual Activity  . Alcohol use: No    Alcohol/week: 0.0 standard drinks  . Drug use: No  . Sexual activity: Not on file  Other Topics Concern  . Not on file  Social History Narrative   Full time.    Social Determinants of Health   Financial Resource Strain:   . Difficulty of Paying Living Expenses:   Food Insecurity:   . Worried About Charity fundraiser in the Last Year:   . Arboriculturist in the Last Year:   Transportation Needs:   . Film/video editor (Medical):   Marland Kitchen Lack of Transportation (Non-Medical):   Physical Activity:   . Days of Exercise per Week:   . Minutes of Exercise per Session:   Stress:    . Feeling of Stress :   Social Connections:   . Frequency of Communication with Friends and Family:   . Frequency of Social Gatherings with Friends and Family:   . Attends Religious Services:   . Active Member of Clubs or Organizations:   . Attends Archivist Meetings:   Marland Kitchen Marital Status:      Review of Systems: A 12 point ROS discussed and pertinent positives are indicated in the HPI above.  All other systems are negative.  Review of Systems  Constitutional: Negative for appetite change, chills and fever.  Respiratory: Negative for cough and shortness of breath.   Cardiovascular: Negative for chest pain.  Gastrointestinal:  Negative for abdominal pain, blood in stool, diarrhea, nausea and vomiting.  Genitourinary: Negative for dysuria, flank pain and hematuria.  Musculoskeletal: Negative for back pain.  Neurological: Negative for dizziness and headaches.    Vital Signs: BP (!) 160/96   Pulse 67   Temp 98.4 F (36.9 C) (Oral)   Resp 16   Ht 6\' 2"  (1.88 m)   Wt 275 lb (124.7 kg)   SpO2 99%   BMI 35.31 kg/m   Physical Exam Vitals reviewed.  Constitutional:      General: He is not in acute distress. HENT:     Head: Normocephalic.     Mouth/Throat:     Mouth: Mucous membranes are moist.     Pharynx: Oropharynx is clear. No oropharyngeal exudate or posterior oropharyngeal erythema.  Cardiovascular:     Rate and Rhythm: Normal rate and regular rhythm.  Pulmonary:     Effort: Pulmonary effort is normal.     Breath sounds: Normal breath sounds.  Abdominal:     General: There is no distension.     Palpations: Abdomen is soft.     Tenderness: There is no abdominal tenderness.  Skin:    General: Skin is warm and dry.  Neurological:     Mental Status: He is alert and oriented to person, place, and time.  Psychiatric:        Mood and Affect: Mood normal.        Behavior: Behavior normal.        Thought Content: Thought content normal.        Judgment:  Judgment normal.      MD Evaluation Airway: WNL Heart: WNL Abdomen: WNL Chest/ Lungs: WNL ASA  Classification: 2 Mallampati/Airway Score: One   Imaging: CT RENAL STONE STUDY  Result Date: 02/02/2020 CLINICAL DATA:  Indeterminate right renal mass on recent ultrasound. Renal insufficiency. EXAM: CT ABDOMEN AND PELVIS WITHOUT CONTRAST TECHNIQUE: Multidetector CT imaging of the abdomen and pelvis was performed following the standard protocol without IV contrast. COMPARISON:  12/25/2019 renal sonogram. FINDINGS: Lower chest: No significant pulmonary nodules or acute consolidative airspace disease. Coronary atherosclerosis. Hepatobiliary: Normal liver size. No liver mass. Normal gallbladder with no radiopaque cholelithiasis. No biliary ductal dilatation. Pancreas: Normal, with no mass or duct dilation. Spleen: Normal size. No mass. Adrenals/Urinary Tract: Normal adrenals. Soft tissue density partially exophytic 6.4 x 5.4 cm renal cortical mass in the medial upper right kidney (series 2/image 24). Simple 2.8 cm posterior interpolar right renal cyst. Simple 1.7 cm anterior lower left renal cyst. No renal stones. No hydronephrosis. Normal caliber ureters. No ureteral stones. Normal bladder. Stomach/Bowel: Normal non-distended stomach. Normal caliber small bowel with no small bowel wall thickening. Normal appendix. Scattered minimal colonic diverticulosis with no large bowel wall thickening or significant pericolonic fat stranding. Vascular/Lymphatic: Atherosclerotic nonaneurysmal abdominal aorta. No pathologically enlarged lymph nodes in the abdomen or pelvis. Reproductive: Top-normal size prostate. Fiducial markers surrounding the prostate Other: No pneumoperitoneum, ascites or focal fluid collection. Small fat containing umbilical hernia. Musculoskeletal: No aggressive appearing focal osseous lesions. Marked lumbar spondylosis. IMPRESSION: 1. Soft tissue density partially exophytic 6.4 cm renal cortical  mass in the medial upper right kidney, incompletely characterized on this noncontrast study (patient unable to receive IV contrast due to GFR below 30). Renal cell carcinoma is to be excluded. If the patient's renal function improves (GFR greater than or equal to 30), renal mass protocol MRI (preferred) or CT abdomen without and with IV contrast is indicated for further  evaluation. 2. No evidence of metastatic disease in the abdomen or pelvis on this noncontrast CT study. 3. Coronary atherosclerosis. 4. Aortic Atherosclerosis (ICD10-I70.0). Electronically Signed   By: Ilona Sorrel M.D.   On: 02/02/2020 16:52    Labs:  CBC: Recent Labs    02/26/20 0546  WBC 7.4  HGB 11.9*  HCT 38.3*  PLT 200    COAGS: Recent Labs    02/26/20 0546  INR 1.1    BMP: Recent Labs    02/01/20 1402  CREATININE 3.00*    LIVER FUNCTION TESTS: No results for input(s): BILITOT, AST, ALT, ALKPHOS, PROT, ALBUMIN in the last 8760 hours.  TUMOR MARKERS: No results for input(s): AFPTM, CEA, CA199, CHROMGRNA in the last 8760 hours.  Assessment and Plan:  74 y/o M with history of prostate cancer and CKD recently noted to have a right renal mass on imaging who presents today for a right renal mass biopsy.  Patient has been NPO since 6 pm last night except for a few sips of water with his morning medications, he takes ASA 81 mg QD with last dose Sunday. Afebrile, WBC 7.4, hgb 11.9, plt 200, INR 1.1.  Risks and benefits of right renal mass biopsy was discussed with the patient and/or patient's family including, but not limited to bleeding, infection, damage to adjacent structures or low yield requiring additional tests.  All of the questions were answered and there is agreement to proceed.  Consent signed and in chart.  Thank you for this interesting consult.  I greatly enjoyed meeting Todd Robertson and look forward to participating in their care.  A copy of this report was sent to the requesting  provider on this date.  Electronically Signed: Joaquim Nam, PA-C 02/26/2020, 7:17 AM   I spent a total of 30 Minutes  in face to face in clinical consultation, greater than 50% of which was counseling/coordinating care for right renal mass biopsy.

## 2020-02-28 LAB — SURGICAL PATHOLOGY

## 2020-03-19 ENCOUNTER — Encounter: Payer: Self-pay | Admitting: Urology

## 2020-03-19 ENCOUNTER — Other Ambulatory Visit: Payer: Self-pay

## 2020-03-19 ENCOUNTER — Ambulatory Visit (INDEPENDENT_AMBULATORY_CARE_PROVIDER_SITE_OTHER): Payer: Medicare Other | Admitting: Urology

## 2020-03-19 VITALS — BP 175/88 | HR 74 | Temp 98.1°F | Ht 74.0 in | Wt 275.0 lb

## 2020-03-19 DIAGNOSIS — N189 Chronic kidney disease, unspecified: Secondary | ICD-10-CM | POA: Diagnosis not present

## 2020-03-19 DIAGNOSIS — C641 Malignant neoplasm of right kidney, except renal pelvis: Secondary | ICD-10-CM | POA: Insufficient documentation

## 2020-03-19 DIAGNOSIS — R809 Proteinuria, unspecified: Secondary | ICD-10-CM | POA: Diagnosis not present

## 2020-03-19 DIAGNOSIS — I5032 Chronic diastolic (congestive) heart failure: Secondary | ICD-10-CM | POA: Diagnosis not present

## 2020-03-19 DIAGNOSIS — E6609 Other obesity due to excess calories: Secondary | ICD-10-CM | POA: Diagnosis not present

## 2020-03-19 DIAGNOSIS — E1129 Type 2 diabetes mellitus with other diabetic kidney complication: Secondary | ICD-10-CM | POA: Diagnosis not present

## 2020-03-19 DIAGNOSIS — I129 Hypertensive chronic kidney disease with stage 1 through stage 4 chronic kidney disease, or unspecified chronic kidney disease: Secondary | ICD-10-CM | POA: Diagnosis not present

## 2020-03-19 DIAGNOSIS — D638 Anemia in other chronic diseases classified elsewhere: Secondary | ICD-10-CM | POA: Diagnosis not present

## 2020-03-19 DIAGNOSIS — E559 Vitamin D deficiency, unspecified: Secondary | ICD-10-CM | POA: Diagnosis not present

## 2020-03-19 DIAGNOSIS — E1122 Type 2 diabetes mellitus with diabetic chronic kidney disease: Secondary | ICD-10-CM | POA: Diagnosis not present

## 2020-03-19 DIAGNOSIS — Z79899 Other long term (current) drug therapy: Secondary | ICD-10-CM | POA: Diagnosis not present

## 2020-03-19 NOTE — Patient Instructions (Signed)
Renal Mass  A renal mass is a growth in the kidney. A renal mass may be found while performing an MRI, CT scan, or ultrasound for other problems of the abdomen. Certain types of cancers, infections, or injuries can cause a renal mass. A renal mass that is cancerous (malignant) may grow or spread quickly. Others are harmless (benign). What are common types of renal masses? Renal masses include:  Tumors. These may be cancerous (malignant) or noncancerous (benign). ? The most common type of kidney cancer is renal cell carcinoma. ? The most common benign tumors of the kidney include renal adenomas, oncocytomas, and angiomyolipoma (AML).  Cysts. These are fluid-filled sacs that form on or in the kidney. ? It is not always known what causes a cyst to develop in or on the kidney. ? Most kidney cysts do not cause symptoms and do not need to be treated. What type of testing might I need? Your health care provider may recommend that you have tests to diagnose the cause of your renal mass. The following tests may be done if a renal mass is found:  Physical exam.  Blood tests.  Urine tests.  Imaging tests, such as ultrasound, CT scan, or MRI.  Biopsy. This is a small sample that is removed from the renal mass and tested in a lab. The exact tests and how often they are done will depend on:  The size and appearance of the renal mass.  Risk factors or medical conditions that increase your risk for problems.  Any symptoms associated with the renal mass, or concerns that you have about it. Tests and physical exams may be done once, or they may be done regularly for a period of time. Tests and exams that are done regularly will help monitor whether the mass is growing and beginning to cause problems. What are common treatments for renal masses? Treatment is not always needed for this condition. Your health care provider may recommend careful monitoring (watchful waiting) and regular tests and exams.  Treatment will depend on the cause of the mass. Follow these instructions at home: What you need to do at home will depend on the cause of the mass. Follow the instructions that your health care provider gives to you. In general:  Take over-the-counter and prescription medicines only as told by your health care provider.  If you are prescribed an antibiotic medicine, take it as told by your health care provider. Do not stop taking the antibiotic even if you start to feel better.  Follow any restrictions that are given to you by your health care provider.  Keep all follow-up visits as told by your health care provider. This is important. ? You may need to see your health care provider once or twice a year to have CT scans and ultrasounds done. These tests will show if your renal mass has changed or grown bigger. Contact a health care provider if you:  Have pain in the side or back (flank pain).  Have a fever.  Feel full soon after eating.  Have pain or swelling in the abdomen.  Lose weight. Get help right away if:  Your pain gets worse.  There is blood in your urine.  You cannot urinate.  You have chest pain.  You have trouble breathing. Summary  A renal mass is a growth in the kidney. It may be cancerous (malignant) and grow or spread quickly, or it may be harmless (benign).  Renal masses may be found while performing   an MRI, CT scan, or ultrasound for other problems of the abdomen.  Your health care provider may recommend that you have tests to diagnose the cause of your renal mass. This may include a physical exam, blood tests, urine tests, imaging, or a biopsy.  Treatment is not always needed for this condition. Careful monitoring (watchful waiting) may be recommended. This information is not intended to replace advice given to you by your health care provider. Make sure you discuss any questions you have with your health care provider. Document Revised: 12/01/2017  Document Reviewed: 12/01/2017 Elsevier Patient Education  2020 Elsevier Inc.  

## 2020-03-19 NOTE — Progress Notes (Signed)
03/19/2020 4:03 PM   Todd Robertson 11/19/1945 [id number]  Referring provider: Monico Blitz, MD [address] [place],  [place] [address]  Right renal mass  HPI: Todd Robertson is a 74yo with a hx of right renal mass. 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 Creatinine is 3.00. NO significant LUTS.   PMH: Past Medical History:  Diagnosis Date  . Arthritis   . Cancer [facility])    prostate  . Cardiomyopathy    secondary  . CKD (chronic kidney disease)   . DM2 (diabetes mellitus, type 2) ([place])   . Gout   . HTN (hypertension)    unspec  . Hypercholesterolemia   . Nonischemic cardiomyopathy ([place])    a. EF 40-50% by most recent 2-D echo b. EF 25% by cardiac cath in 2004  . Overweight(278.02)     Surgical History: Past Surgical History:  Procedure Laterality Date  . COLONOSCOPY    . HERNIA REPAIR    . KNEE ARTHROPLASTY Left 08/08/2017   Procedure: LEFT TOTAL KNEE ARTHROPLASTY WITH COMPUTER NAVIGATION;  Surgeon: Rod Can, MD;  Location: [place];  Service: Orthopedics;  Laterality: Left;  Needs RNFA  . KNEE ARTHROPLASTY Right 11/17/2017   Procedure: RIGHT TOTAL KNEE ARTHROPLASTY WITH COMPUTER NAVIGATION;  Surgeon: Rod Can, MD;  [facility];  Service: Orthopedics;  Laterality: Right;  NEEDS RNFA    Home Medications:  Allergies as of 03/19/2020   No Known Allergies     Medication List       Accurate as of Mar 19, 2020  4:03 PM. If you have any questions, ask your nurse or doctor.        acetaminophen 325 MG tablet Commonly known as: TYLENOL Take by mouth.   albuterol 108 (90 Base) MCG/ACT inhaler Commonly known as: VENTOLIN HFA Inhale 1-2 puffs into the lungs every 6 (six) hours as needed for wheezing or shortness of breath.   allopurinol 300 MG tablet Commonly known as: ZYLOPRIM Take 300 mg by mouth every morning.   aspirin 81 MG chewable tablet Chew 1 tablet (81 mg total) by mouth 2 (two) times daily.   atorvastatin 40 MG  tablet Commonly known as: LIPITOR Take 1 tablet (40 mg total) by mouth daily. What changed: when to take this   benazepril 20 MG tablet Commonly known as: LOTENSIN Take 20 mg by mouth every morning.   budesonide-formoterol 160-4.5 MCG/ACT inhaler Commonly known as: SYMBICORT Inhale 1 puff into the lungs daily. may take 2nd dose in the evening if needed for shortness of breath   bumetanide 1 MG tablet Commonly known as: BUMEX Take 1 mg by mouth daily.   carvedilol 25 MG tablet Commonly known as: COREG Take 1 tablet (25 mg total) by mouth 2 (two) times daily.   cetirizine 10 MG tablet Commonly known as: ZYRTEC Take 10 mg by mouth daily.   Compact Space Scientist, forensic  USE AS DIRECTED   LUBRICATING EYE DROPS OP Apply 1 drop to eye daily.   Mucinex 600 MG 12 hr tablet Generic drug: guaiFENesin Take 600 mg by mouth 2 (two) times daily as needed for cough.   multivitamin tablet Take 1 tablet by mouth daily.   Muscle Rub 10-15 % Crea Apply 1 application topically as needed for muscle pain.   ondansetron 4 MG tablet Commonly known as: ZOFRAN Take 1 tablet (4 mg total) by mouth every 6 (six) hours as needed for nausea.   tiZANidine 4 MG tablet  Commonly known as: ZANAFLEX Take 4 mg by mouth every 8 (eight) hours as needed for muscle spasms.       Allergies: No Known Allergies  Family History: Family History  Problem Relation Age of Onset  . Hypertension Mother   . Kidney disease Mother   . Heart disease Father   . Kidney disease Other     Social History:  reports that he quit smoking about 35 years ago. His smoking use included cigarettes. He started smoking about 54 years ago. He has a 10.00 pack-year smoking history. He has never used smokeless tobacco. He reports that he does not drink alcohol or use drugs.  ROS: All other review of systems were reviewed and are negative except what is noted above in HPI  Physical Exam: BP (!) 175/88    Pulse 74   Temp 98.1 F (36.7 C)   Ht 6\' 2"  (1.88 m)   Wt 275 lb (124.7 kg)   BMI 35.31 kg/m   Constitutional:  Alert and oriented, No acute distress. HEENT: [place] AT, moist mucus membranes.  Trachea midline, no masses. Cardiovascular: No clubbing, cyanosis, or edema. Respiratory: Normal respiratory effort, no increased work of breathing. GI: Abdomen is soft, nontender, nondistended, no abdominal masses GU: No CVA tenderness.  Lymph: No cervical or inguinal lymphadenopathy. Skin: No rashes, bruises or suspicious lesions. Neurologic: Grossly intact, no focal deficits, moving all 4 extremities. Psychiatric: Normal mood and affect.  Laboratory Data: Lab Results  Component Value Date   WBC 7.4 02/26/2020   HGB 11.9 (L) 02/26/2020   HCT 38.3 (L) 02/26/2020   MCV 94.6 02/26/2020   PLT 200 02/26/2020    Lab Results  Component Value Date   CREATININE 3.00 (H) 02/01/2020    No results found for: PSA  No results found for: TESTOSTERONE  Lab Results  Component Value Date   HGBA1C 5.8 (H) 11/11/2017    Urinalysis    Component Value Date/Time   BILIRUBINUR neg 02/13/2020 1122   PROTEINUR Negative 02/13/2020 1122   UROBILINOGEN 0.2 02/13/2020 1122   NITRITE neg 02/13/2020 1122   LEUKOCYTESUR Negative 02/13/2020 1122    No results found for: LABMICR, [place], RBCUA, LABEPIT, MUCUS, BACTERIA  Pertinent Imaging: CT biopsy 4/20: images reviewed and pathology reviewed and discussed with the patient . No results found for this or any previous visit. No results found for this or any previous visit. No results found for this or any previous visit. No results found for this or any previous visit. Results for orders placed during the hospital encounter of 12/25/19  US RENAL   Narrative CLINICAL DATA:  Chronic kidney disease.  EXAM: RENAL / URINARY TRACT ULTRASOUND COMPLETE  COMPARISON:  None.  FINDINGS: Right Kidney:  Renal measurements: 14.0 x 8.9 x 7.9 cm = volume: 511  mL. Complex avascular versus hypovascular mass in the lower lateral kidney measures 5.7 x 2.4 x 5.8 cm. Additional 2.9 x 2.4 x 2.5 cm cyst is seen in the mid kidney. No hydronephrosis.  Left Kidney:  Renal measurements: 12.9 x 6.5 x 5.7 cm = volume: 252 mL. Echogenicity within normal limits. Cyst in the upper medial kidney measures 1.6 x 1.5 x 1.6 cm. No solid mass or hydronephrosis visualized.  Bladder:  Nondistended and not well evaluated.  Other:  None.  IMPRESSION: 1. Complex right renal mass measuring greater than 5 cm. This is incompletely characterized by sonography. Recommend renal protocol MRI (IV contrast if renal function permits). 2. Additional cysts in both  kidneys. 3. Bladder nondistended and not evaluated.   Electronically Signed   By: Keith Rake M.D.   On: 12/25/2019 23:26    No results found for this or any previous visit. No results found for this or any previous visit. Results for orders placed during the hospital encounter of 02/01/20  CT RENAL STONE STUDY   Narrative CLINICAL DATA:  Indeterminate right renal mass on recent ultrasound. Renal insufficiency.  EXAM: CT ABDOMEN AND PELVIS WITHOUT CONTRAST  TECHNIQUE: Multidetector CT imaging of the abdomen and pelvis was performed following the standard protocol without IV contrast.  COMPARISON:  12/25/2019 renal sonogram.  FINDINGS: Lower chest: No significant pulmonary nodules or acute consolidative airspace disease. Coronary atherosclerosis.  Hepatobiliary: Normal liver size. No liver mass. Normal gallbladder with no radiopaque cholelithiasis. No biliary ductal dilatation.  Pancreas: Normal, with no mass or duct dilation.  Spleen: Normal size. No mass.  Adrenals/Urinary Tract: Normal adrenals. Soft tissue density partially exophytic 6.4 x 5.4 cm renal cortical mass in the medial upper right kidney (series 2/image 24). Simple 2.8 cm posterior interpolar right renal cyst. Simple  1.7 cm anterior lower left renal cyst. No renal stones. No hydronephrosis. Normal caliber ureters. No ureteral stones. Normal bladder.  Stomach/Bowel: Normal non-distended stomach. Normal caliber small bowel with no small bowel wall thickening. Normal appendix. Scattered minimal colonic diverticulosis with no large bowel wall thickening or significant pericolonic fat stranding.  Vascular/Lymphatic: Atherosclerotic nonaneurysmal abdominal aorta. No pathologically enlarged lymph nodes in the abdomen or pelvis.  Reproductive: Top-normal size prostate. Fiducial markers surrounding the prostate  Other: No pneumoperitoneum, ascites or focal fluid collection. Small fat containing umbilical hernia.  Musculoskeletal: No aggressive appearing focal osseous lesions. Marked lumbar spondylosis.  IMPRESSION: 1. Soft tissue density partially exophytic 6.4 cm renal cortical mass in the medial upper right kidney, incompletely characterized on this noncontrast study (patient unable to receive IV contrast due to GFR below 30). Renal cell carcinoma is to be excluded. If the patient's renal function improves (GFR greater than or equal to 30), renal mass protocol MRI (preferred) or CT abdomen without and with IV contrast is indicated for further evaluation. 2. No evidence of metastatic disease in the abdomen or pelvis on this noncontrast CT study. 3. Coronary atherosclerosis. 4. Aortic Atherosclerosis (ICD10-I70.0).   Electronically Signed   By: Ilona Sorrel M.D.   On: 02/02/2020 16:52     Assessment & Plan:    1. Renal cell carcinoma of right kidney (HCC) -We discussed the management including surveillance, radical nephrectomy, partial nephrectomy and ablation. After discussing the options the patient and family elect for radical nephrectomy. Risks/benefits/alternatives discussed. I will forward my note to his nephrologist.   No follow-ups on file.  Nicolette Bang, MD  [facility]  Urology [place]

## 2020-03-19 NOTE — Progress Notes (Signed)
Urological Symptom Review  Patient is experiencing the following symptoms: None    Review of Systems  Gastrointestinal (upper)  : Negative for upper GI symptoms  Gastrointestinal (lower) : Negative for lower GI symptoms  Constitutional : Negative for symptoms  Skin: Negative for skin symptoms  Eyes: Negative for eye symptoms  Ear/Nose/Throat : Negative for Ear/Nose/Throat symptoms  Hematologic/Lymphatic: Negative for Hematologic/Lymphatic symptoms  Cardiovascular : Negative for cardiovascular symptoms  Respiratory : Cough Shortness of breath  Endocrine: Negative for endocrine symptoms  Musculoskeletal: Negative for musculoskeletal symptoms  Neurological: Negative for neurological symptoms  Psychologic: Negative for psychiatric symptoms

## 2020-03-27 ENCOUNTER — Other Ambulatory Visit: Payer: Self-pay | Admitting: Urology

## 2020-03-27 DIAGNOSIS — D638 Anemia in other chronic diseases classified elsewhere: Secondary | ICD-10-CM | POA: Diagnosis not present

## 2020-03-27 DIAGNOSIS — E559 Vitamin D deficiency, unspecified: Secondary | ICD-10-CM | POA: Diagnosis not present

## 2020-03-27 DIAGNOSIS — N189 Chronic kidney disease, unspecified: Secondary | ICD-10-CM | POA: Diagnosis not present

## 2020-03-27 DIAGNOSIS — C641 Malignant neoplasm of right kidney, except renal pelvis: Secondary | ICD-10-CM | POA: Diagnosis not present

## 2020-03-27 DIAGNOSIS — E1129 Type 2 diabetes mellitus with other diabetic kidney complication: Secondary | ICD-10-CM | POA: Diagnosis not present

## 2020-03-27 DIAGNOSIS — I129 Hypertensive chronic kidney disease with stage 1 through stage 4 chronic kidney disease, or unspecified chronic kidney disease: Secondary | ICD-10-CM | POA: Diagnosis not present

## 2020-03-27 DIAGNOSIS — E211 Secondary hyperparathyroidism, not elsewhere classified: Secondary | ICD-10-CM | POA: Diagnosis not present

## 2020-03-27 DIAGNOSIS — R809 Proteinuria, unspecified: Secondary | ICD-10-CM | POA: Diagnosis not present

## 2020-03-27 DIAGNOSIS — I5032 Chronic diastolic (congestive) heart failure: Secondary | ICD-10-CM | POA: Diagnosis not present

## 2020-03-27 DIAGNOSIS — E1122 Type 2 diabetes mellitus with diabetic chronic kidney disease: Secondary | ICD-10-CM | POA: Diagnosis not present

## 2020-03-31 ENCOUNTER — Encounter: Payer: Self-pay | Admitting: Cardiology

## 2020-03-31 ENCOUNTER — Other Ambulatory Visit: Payer: Self-pay

## 2020-03-31 ENCOUNTER — Encounter: Payer: Self-pay | Admitting: *Deleted

## 2020-03-31 ENCOUNTER — Ambulatory Visit (INDEPENDENT_AMBULATORY_CARE_PROVIDER_SITE_OTHER): Payer: Medicare Other | Admitting: Cardiology

## 2020-03-31 VITALS — BP 140/72 | HR 78 | Ht 74.0 in | Wt 281.0 lb

## 2020-03-31 DIAGNOSIS — I10 Essential (primary) hypertension: Secondary | ICD-10-CM | POA: Diagnosis not present

## 2020-03-31 DIAGNOSIS — Z01810 Encounter for preprocedural cardiovascular examination: Secondary | ICD-10-CM | POA: Diagnosis not present

## 2020-03-31 DIAGNOSIS — E782 Mixed hyperlipidemia: Secondary | ICD-10-CM | POA: Diagnosis not present

## 2020-03-31 DIAGNOSIS — I428 Other cardiomyopathies: Secondary | ICD-10-CM | POA: Diagnosis not present

## 2020-03-31 NOTE — Patient Instructions (Signed)

## 2020-03-31 NOTE — Progress Notes (Signed)
Clinical Summary Mr. Mccubbins is a 74 y.o.male seen today for follow up of the following medical problems.   1. NICM  - previously significant LV dysfunction with LVEF 25-30% in 2004, last echo 2011 shows normalized LVEF at 60%. Cath 2004 with no significant coronary disease - 08/2017 echo LVEF 55-60%   - no recent SOB/DOE - no recent edema - compliant with meds   2. HTN  - home bp's 120s/70s - has a component of white coat HTN   3. HL  -labs followed by pcp - compliant with statin  4. Prostate Cancer - treated radiation at [facility] - followed by urologist.   5.CKD  6. PVCs - episode of NSVT during recent knee replacement - repeat echo 08/2017 showed stable LVEF 55-60%  - no recent palpitations.   7. Renal mass/Preoperative evaluation - followed by urology, bx showed papillary renal cell CA - plans for nephrectomy  - rides stationary bike x 8 miles 5 days a week, no symptoms. Up to 1 mile on ellipitical.    Past Medical History:  Diagnosis Date  . Arthritis   . Cancer [facility])    prostate  . Cardiomyopathy    secondary  . CKD (chronic kidney disease)   . DM2 (diabetes mellitus, type 2) ([place])   . Gout   . HTN (hypertension)    unspec  . Hypercholesterolemia   . Nonischemic cardiomyopathy ([place])    a. EF 40-50% by most recent 2-D echo b. EF 25% by cardiac cath in 2004  . Overweight(278.02)      No Known Allergies   Current Outpatient Medications  Medication Sig Dispense Refill  . acetaminophen (TYLENOL) 325 MG tablet Take by mouth.    Marland Kitchen albuterol (VENTOLIN HFA) 108 (90 Base) MCG/ACT inhaler Inhale 1-2 puffs into the lungs every 6 (six) hours as needed for wheezing or shortness of breath.     . allopurinol (ZYLOPRIM) 300 MG tablet Take 300 mg by mouth every morning.    Marland Kitchen aspirin 81 MG chewable tablet Chew 1 tablet (81 mg total) by mouth 2 (two) times daily. 60 tablet 1  . atorvastatin (LIPITOR) 40 MG tablet Take 1 tablet (40 mg  total) by mouth daily. (Patient taking differently: Take 40 mg by mouth every evening. ) 90 tablet 3  . benazepril (LOTENSIN) 20 MG tablet Take 20 mg by mouth every morning.     . budesonide-formoterol (SYMBICORT) 160-4.5 MCG/ACT inhaler Inhale 1 puff into the lungs daily. may take 2nd dose in the evening if needed for shortness of breath    . bumetanide (BUMEX) 1 MG tablet Take 1 mg by mouth daily.     . Carboxymethylcellul-Glycerin (LUBRICATING EYE DROPS OP) Apply 1 drop to eye daily.    . carvedilol (COREG) 25 MG tablet Take 1 tablet (25 mg total) by mouth 2 (two) times daily. 180 tablet 3  . cetirizine (ZYRTEC) 10 MG tablet Take 10 mg by mouth daily.    Marland Kitchen guaiFENesin (MUCINEX) 600 MG 12 hr tablet Take 600 mg by mouth 2 (two) times daily as needed for cough.     . Menthol-Methyl Salicylate (MUSCLE RUB) 10-15 % CREA Apply 1 application topically as needed for muscle pain.    . Multiple Vitamin (MULTIVITAMIN) tablet Take 1 tablet by mouth daily.    . ondansetron (ZOFRAN) 4 MG tablet Take 1 tablet (4 mg total) by mouth every 6 (six) hours as needed for nausea. 20 tablet 0  . Spacer/Aero-Holding Josiah Lobo (  COMPACT SPACE CHAMBER) DEVI Compact Space Chamber  USE AS DIRECTED    . tiZANidine (ZANAFLEX) 4 MG tablet Take 4 mg by mouth every 8 (eight) hours as needed for muscle spasms.      No current facility-administered medications for this visit.     Past Surgical History:  Procedure Laterality Date  . COLONOSCOPY    . HERNIA REPAIR    . KNEE ARTHROPLASTY Left 08/08/2017   Procedure: LEFT TOTAL KNEE ARTHROPLASTY WITH COMPUTER NAVIGATION;  Surgeon: Rod Can, MD;  Location: [place];  Service: Orthopedics;  Laterality: Left;  Needs RNFA  . KNEE ARTHROPLASTY Right 11/17/2017   Procedure: RIGHT TOTAL KNEE ARTHROPLASTY WITH COMPUTER NAVIGATION;  Surgeon: Rod Can, MD;  [facility];  Service: Orthopedics;  Laterality: Right;  NEEDS RNFA     No Known Allergies    Family History    Problem Relation Age of Onset  . Hypertension Mother   . Kidney disease Mother   . Heart disease Father   . Kidney disease Other      Social History Mr. Canion reports that he quit smoking about 35 years ago. His smoking use included cigarettes. He started smoking about 54 years ago. He has a 10.00 pack-year smoking history. He has never used smokeless tobacco. Mr. Azeez reports no history of alcohol use.   Review of Systems CONSTITUTIONAL: No weight loss, fever, chills, weakness or fatigue.  HEENT: Eyes: No visual loss, blurred vision, double vision or yellow sclerae.No hearing loss, sneezing, congestion, runny nose or sore throat.  SKIN: No rash or itching.  CARDIOVASCULAR: per hpi RESPIRATORY: No shortness of breath, cough or sputum.  GASTROINTESTINAL: No anorexia, nausea, vomiting or diarrhea. No abdominal pain or blood.  GENITOURINARY: No burning on urination, no polyuria NEUROLOGICAL: No headache, dizziness, syncope, paralysis, ataxia, numbness or tingling in the extremities. No change in bowel or bladder control.  MUSCULOSKELETAL: No muscle, back pain, joint pain or stiffness.  LYMPHATICS: No enlarged nodes. No history of splenectomy.  PSYCHIATRIC: No history of depression or anxiety.  ENDOCRINOLOGIC: No reports of sweating, cold or heat intolerance. No polyuria or polydipsia.  Marland Kitchen   Physical Examination Today's Vitals   03/31/20 1408  BP: 140/72  Pulse: 78  SpO2: 98%  Weight: 281 lb (127.5 kg)  Height: 6\' 2"  (1.88 m)   Body mass index is 36.08 kg/m.  Gen: resting comfortably, no acute distress HEENT: no scleral icterus, pupils equal round and reactive, no palptable cervical adenopathy,  CV: RRR, no m/r/g, no jvd Resp: Clear to auscultation bilaterally GI: abdomen is soft, non-tender, non-distended, normal bowel sounds, no hepatosplenomegaly MSK: extremities are warm, no edema.  Skin: warm, no rash Neuro:  no focal deficits Psych: appropriate  affect    Assessment and Plan  1. NICM  - previously significant LV systolic dysfunction, that has since normalized on medical therapy  - no symptoms, continue to monitor  2. HTN  - home bp's at goal, conitnue current regimen  3. HL  - continue statin, request pcp labs   4. Preoperative evaluation - tolerates greater than 4METs regularly without limitation or symptoms - recommend proceeding with nephrectomy as planned, no prior cardiac testing is indicated.   EKG shows SR, RBBB, no acute ischemic changes  F/u 1 tyear   Arnoldo Lenis, M.D.

## 2020-04-01 DIAGNOSIS — E1151 Type 2 diabetes mellitus with diabetic peripheral angiopathy without gangrene: Secondary | ICD-10-CM | POA: Diagnosis not present

## 2020-04-01 DIAGNOSIS — E1140 Type 2 diabetes mellitus with diabetic neuropathy, unspecified: Secondary | ICD-10-CM | POA: Diagnosis not present

## 2020-04-01 DIAGNOSIS — B351 Tinea unguium: Secondary | ICD-10-CM | POA: Diagnosis not present

## 2020-04-01 DIAGNOSIS — L110 Acquired keratosis follicularis: Secondary | ICD-10-CM | POA: Diagnosis not present

## 2020-04-06 DIAGNOSIS — I10 Essential (primary) hypertension: Secondary | ICD-10-CM | POA: Diagnosis not present

## 2020-04-06 DIAGNOSIS — M159 Polyosteoarthritis, unspecified: Secondary | ICD-10-CM | POA: Diagnosis not present

## 2020-04-06 DIAGNOSIS — E7800 Pure hypercholesterolemia, unspecified: Secondary | ICD-10-CM | POA: Diagnosis not present

## 2020-05-07 DIAGNOSIS — M159 Polyosteoarthritis, unspecified: Secondary | ICD-10-CM | POA: Diagnosis not present

## 2020-05-07 DIAGNOSIS — I10 Essential (primary) hypertension: Secondary | ICD-10-CM | POA: Diagnosis not present

## 2020-05-07 DIAGNOSIS — E7800 Pure hypercholesterolemia, unspecified: Secondary | ICD-10-CM | POA: Diagnosis not present

## 2020-05-08 NOTE — Patient Instructions (Addendum)
DUE TO COVID-19 ONLY ONE VISITOR IS ALLOWED TO COME WITH YOU AND STAY IN THE WAITING ROOM ONLY DURING PRE OP AND PROCEDURE DAY OF SURGERY. THE 1 VISITOR MAY VISIT WITH YOU AFTER SURGERY IN YOUR PRIVATE ROOM DURING VISITING HOURS ONLY!  YOU NEED TO HAVE A COVID 19 TEST ON 05-13-20 @ 9:40 AM, THIS TEST MUST BE DONE BEFORE SURGERY, COME  [place], [address] [place] , [id number].  ([place]) ONCE YOUR COVID TEST IS COMPLETED, PLEASE BEGIN THE QUARANTINE INSTRUCTIONS AS OUTLINED IN YOUR HANDOUT.                STEPHENSON CICHY  05/08/2020   Your procedure is scheduled on: 05-16-20   Report [facility] Main  Entrance    Report to [facility] at 5:30 AM     Call this number if you have problems the morning of surgery [phone]    Remember: Please consume a Clear Liquid Diet on the Day of Prep, per your surgeon's instructions    CLEAR LIQUID DIET   Foods Allowed                                                                     Foods Excluded  Coffee and tea, regular and decaf                             liquids that you cannot  Plain Jell-O any favor except red or purple                                           see through such as: Fruit ices (not with fruit pulp)                                     milk, soups, orange juice  Iced Popsicles                                    All solid food Carbonated beverages, regular and diet                                    Cranberry, grape and apple juices Sports drinks like Gatorade Lightly seasoned clear broth or consume(fat free) Sugar, honey syrup   _____________________________________________________________________     Take these medicines the morning of surgery with A SIP OF WATER: Allopurinol (Zyloprim), Carvedilol (Coreg) and Loratadine (Claritin). You may also use your eyedrops and inhaler if needed.   BRUSH YOUR TEETH MORNING OF SURGERY AND RINSE YOUR MOUTH OUT, NO CHEWING GUM CANDY OR MINTS.                                   You may not have any metal on your body including hair pins and  piercings     Do not wear jewelry,cologne, lotions, powders or deodorant                     Men may shave face and neck.   Do not bring valuables to the hospital. [place].  Contacts, dentures or bridgework may not be worn into surgery.  You may bring a small overnight bag      Special Instructions: N/A              Please read over the following fact sheets you were given: _____________________________________________________________________             [facility] - Preparing for Surgery Before surgery, you can play an important role.  Because skin is not sterile, your skin needs to be as free of germs as possible.  You can reduce the number of germs on your skin by washing with CHG (chlorahexidine gluconate) soap before surgery.  CHG is an antiseptic cleaner which kills germs and bonds with the skin to continue killing germs even after washing. Please DO NOT use if you have an allergy to CHG or antibacterial soaps.  If your skin becomes reddened/irritated stop using the CHG and inform your nurse when you arrive at Short Stay. Do not shave (including legs and underarms) for at least 48 hours prior to the first CHG shower.  You may shave your face/neck. Please follow these instructions carefully:  1.  Shower with CHG Soap the night before surgery and the  morning of Surgery.  2.  If you choose to wash your hair, wash your hair first as usual with your  normal  shampoo.  3.  After you shampoo, rinse your hair and body thoroughly to remove the  shampoo.                           4.  Use CHG as you would any other liquid soap.  You can apply chg directly  to the skin and wash                       Gently with a scrungie or clean washcloth.  5.  Apply the CHG Soap to your body ONLY FROM THE NECK DOWN.   Do not use on face/ open                            Wound or open sores. Avoid contact with eyes, ears mouth and genitals (private parts).                       Wash face,  Genitals (private parts) with your normal soap.             6.  Wash thoroughly, paying special attention to the area where your surgery  will be performed.  7.  Thoroughly rinse your body with warm water from the neck down.  8.  DO NOT shower/wash with your normal soap after using and rinsing off  the CHG Soap.                9.  Pat yourself dry with a clean towel.            10.  Wear clean pajamas.  11.  Place clean sheets on your bed the night of your first shower and do not  sleep with pets. Day of Surgery : Do not apply any lotions/deodorants the morning of surgery.  Please wear clean clothes to the hospital/surgery center.  FAILURE TO FOLLOW THESE INSTRUCTIONS MAY RESULT IN THE CANCELLATION OF YOUR SURGERY PATIENT SIGNATURE_________________________________  NURSE SIGNATURE__________________________________  ________________________________________________________________________

## 2020-05-08 NOTE — Progress Notes (Addendum)
COVID Vaccine Completed: Date COVID Vaccine completed: COVID vaccine manufacturer: [employer]   PCP - Monico Blitz, MD  Cardiologist - Deretha Emory, MD w/cardiac clearance in Office visit dated 03-31-20.   Chest x-ray -  EKG - 03-31-20 Stress Test -  ECHO -  Cardiac Cath -   Sleep Study -  CPAP -   Fasting Blood Sugar - 121 Checks Blood Sugar __1___ time a day  Blood Thinner Instructions: Aspirin Instructions: 81mg -Pt indicated that he was advised to continue taking Last Dose:  Anesthesia review:   No SOB/DOE Rides stationary bike x 8 miles 5 days a week, no symptoms-per Dr. Nelly Laurence 03-31-20 office visit  Patient denies shortness of breath, fever, cough and chest pain at PAT appointment   Patient verbalized understanding of instructions that were given to them at the PAT appointment. Patient was also instructed that they will need to review over the PAT instructions again at home before surgery.

## 2020-05-13 ENCOUNTER — Other Ambulatory Visit: Payer: Self-pay

## 2020-05-13 ENCOUNTER — Encounter (HOSPITAL_COMMUNITY): Payer: Self-pay

## 2020-05-13 ENCOUNTER — Encounter (HOSPITAL_COMMUNITY)
Admission: RE | Admit: 2020-05-13 | Discharge: 2020-05-13 | Disposition: A | Discharge status: Home / Self Care | Payer: Medicare Other | Source: Ambulatory Visit | Attending: Urology | Admitting: Urology

## 2020-05-13 ENCOUNTER — Other Ambulatory Visit (HOSPITAL_COMMUNITY)
Admission: RE | Admit: 2020-05-13 | Discharge: 2020-05-13 | Disposition: A | Discharge status: Home / Self Care | Payer: Medicare Other | Source: Ambulatory Visit | Attending: Urology | Admitting: Urology

## 2020-05-13 DIAGNOSIS — Z01812 Encounter for preprocedural laboratory examination: Secondary | ICD-10-CM | POA: Insufficient documentation

## 2020-05-13 DIAGNOSIS — Z20822 Contact with and (suspected) exposure to covid-19: Secondary | ICD-10-CM | POA: Insufficient documentation

## 2020-05-13 LAB — BASIC METABOLIC PANEL
Anion gap: 10 (ref 5–15)
BUN: 56 mg/dL — ABNORMAL HIGH (ref 8–23)
CO2: 25 mmol/L (ref 22–32)
Calcium: 9.2 mg/dL (ref 8.9–10.3)
Chloride: 105 mmol/L (ref 98–111)
Creatinine, Ser: 2.6 mg/dL — ABNORMAL HIGH (ref 0.61–1.24)
GFR calc Af Amer: 27 mL/min — ABNORMAL LOW (ref >60)
GFR calc non Af Amer: 24 mL/min — ABNORMAL LOW (ref >60)
Glucose, Bld: 133 mg/dL — ABNORMAL HIGH (ref 70–99)
Potassium: 4.6 mmol/L (ref 3.5–5.1)
Sodium: 140 mmol/L (ref 135–145)

## 2020-05-13 LAB — CBC
HCT: 39.6 % (ref 39.0–52.0)
Hemoglobin: 12.3 g/dL — ABNORMAL LOW (ref 13.0–17.0)
MCH: 29.9 pg (ref 26.0–34.0)
MCHC: 31.1 g/dL (ref 30.0–36.0)
MCV: 96.4 fL (ref 80.0–100.0)
Platelets: 182 10*3/uL (ref 150–400)
RBC: 4.11 MIL/uL — ABNORMAL LOW (ref 4.22–5.81)
RDW: 14.4 % (ref 11.5–15.5)
WBC: 7.7 10*3/uL (ref 4.0–10.5)
nRBC: 0 % (ref 0.0–0.2)

## 2020-05-13 LAB — GLUCOSE, CAPILLARY: Glucose-Capillary: 121 mg/dL — ABNORMAL HIGH (ref 70–99)

## 2020-05-13 LAB — SARS CORONAVIRUS 2 (TAT 6-24 HRS): SARS Coronavirus 2: NEGATIVE

## 2020-05-13 LAB — HEMOGLOBIN A1C
Hgb A1c MFr Bld: 6.2 % — ABNORMAL HIGH (ref 4.8–5.6)
Mean Plasma Glucose: 131.24 mg/dL

## 2020-05-13 MED ORDER — MAGNESIUM CITRATE PO SOLN
1.0000 | Freq: Once | ORAL | Status: DC
  Filled 2020-05-13: qty 296

## 2020-05-13 NOTE — Progress Notes (Signed)
05/13/2020 BMP results routed to Dr. Alyson Ingles for review

## 2020-05-14 NOTE — Progress Notes (Signed)
Anesthesia Chart Review   Case: [id number] Date/Time: 05/16/20 0700   Procedure: XI ROBOTIC ASSISTED LAPAROSCOPIC NEPHRECTOMY (Right ) - 2.5 hrs   Anesthesia type: General   Pre-op diagnosis: RIGHT RENAL MASS   Location: [facility] ROOM 03 / [facility]   Surgeons: Cleon Gustin, MD      DISCUSSION:74 y.o. former smoker (10 pack years, quit 11/08/84) with h/o HTN, DM II, CKD, nonischemic cardiomyopathy, right renal mass scheduled for above procedure 05/16/2020 with Dr. Nicolette Bang.   Previously significant LV dysfunction with LVEF 25-30% in 2004, last echo 2011 shows normalized LVEF at 60%. Cath 2004 with no significant coronary disease - 08/2017 echo LVEF 55-60%.   Pt last seen by cardiologist, Dr. Carlyle Dolly, 03/31/2020 for preoperative evaluation.  Per OV note, "- tolerates greater than 4METs regularly without limitation or symptoms - recommend proceeding with nephrectomy as planned, no prior cardiac testing is indicated."  Last seen by nephrologist, Dr. Theador Hawthorne, 03/27/2020.  Aware of upcoming procedure.   Anticipate pt can proceed with planned procedure barring acute status change.   VS: BP (!) 156/88   Pulse 63   Temp 36.8 C (Oral)   Resp 16   Ht 6\' 2"  (1.88 m)   Wt 126.6 kg   SpO2 100%   BMI 35.82 kg/m   PROVIDERS: Monico Blitz, MD is PCP   Carlyle Dolly, MD is Cardiologist   Theador Hawthorne, MD is Nephrologist  LABS: Labs reviewed: Acceptable for surgery. and creatinine stable (all labs ordered are listed, but only abnormal results are displayed)  Labs Reviewed  HEMOGLOBIN A1C - Abnormal; Notable for the following components:      Result Value   Hgb A1c MFr Bld 6.2 (*)    All other components within normal limits  BASIC METABOLIC PANEL - Abnormal; Notable for the following components:   Glucose, Bld 133 (*)    BUN 56 (*)    Creatinine, Ser 2.60 (*)    GFR calc non Af Amer 24 (*)    GFR calc Af Amer 27 (*)    All other components within normal limits  CBC -  Abnormal; Notable for the following components:   RBC 4.11 (*)    Hemoglobin 12.3 (*)    All other components within normal limits  GLUCOSE, CAPILLARY - Abnormal; Notable for the following components:   Glucose-Capillary 121 (*)    All other components within normal limits     IMAGES:   EKG: 03/31/2020 Rate 69 bpm Sinus rhythm with sinus arrhythmia with occasional premature ventricular complexes Left axis deviation  Right bundle branch block  Possible lateral infarct, age undetermined Possible inferior infarct, age undetermined   CV: Echo 08/09/2017 Study Conclusions   - Left ventricle: The cavity size was normal. Wall thickness was  increased in a pattern of moderate LVH. Systolic function was  normal. The estimated ejection fraction was in the range of 55%  to 60%. Wall motion was normal; there were no regional wall  motion abnormalities. Doppler parameters are consistent with  abnormal left ventricular relaxation (grade 1 diastolic  dysfunction).  - Mitral valve: There was mild regurgitation.  - Right atrium: The atrium was mildly dilated.  Past Medical History:  Diagnosis Date  . Arthritis   . Cancer [facility])    prostate  . Cardiomyopathy    secondary  . CKD (chronic kidney disease)   . DM2 (diabetes mellitus, type 2) ([place])   . Gout   . HTN (hypertension)    unspec  .  Hypercholesterolemia   . Nonischemic cardiomyopathy ([place])    a. EF 40-50% by most recent 2-D echo b. EF 25% by cardiac cath in 2004  . Overweight(278.02)     Past Surgical History:  Procedure Laterality Date  . COLONOSCOPY    . HERNIA REPAIR    . KNEE ARTHROPLASTY Left 08/08/2017   Procedure: LEFT TOTAL KNEE ARTHROPLASTY WITH COMPUTER NAVIGATION;  Surgeon: Rod Can, MD;  Location: [place];  Service: Orthopedics;  Laterality: Left;  Needs RNFA  . KNEE ARTHROPLASTY Right 11/17/2017   Procedure: RIGHT TOTAL KNEE ARTHROPLASTY WITH COMPUTER NAVIGATION;  Surgeon: Rod Can, MD;   [facility];  Service: Orthopedics;  Laterality: Right;  NEEDS RNFA    MEDICATIONS: . acetaminophen (TYLENOL) 500 MG tablet  . albuterol (VENTOLIN HFA) 108 (90 Base) MCG/ACT inhaler  . allopurinol (ZYLOPRIM) 300 MG tablet  . aspirin EC 81 MG tablet  . atorvastatin (LIPITOR) 40 MG tablet  . benazepril (LOTENSIN) 20 MG tablet  . budesonide-formoterol (SYMBICORT) 160-4.5 MCG/ACT inhaler  . bumetanide (BUMEX) 1 MG tablet  . carvedilol (COREG) 25 MG tablet  . Cholecalciferol (VITAMIN D) 50 MCG (2000 UT) tablet  . diclofenac Sodium (VOLTAREN) 1 % GEL  . loratadine (CLARITIN) 10 MG tablet  . Multiple Vitamin (MULTIVITAMIN) tablet  . senna (SENOKOT) 8.6 MG tablet  . Tetrahydrozoline HCl (VISINE OP)  . tiZANidine (ZANAFLEX) 4 MG tablet   No current facility-administered medications for this encounter.    Maia Plan [facility] Pre-Surgical Testing [phone] 05/14/20  2:27 PM

## 2020-05-15 MED ORDER — DEXTROSE 5 % IV SOLN
3.0000 g | INTRAVENOUS | Status: AC
  Administered 2020-05-16: 3 g via INTRAVENOUS
  Filled 2020-05-15: qty 3

## 2020-05-15 NOTE — Anesthesia Preprocedure Evaluation (Addendum)
Anesthesia Evaluation  Patient identified by MRN, date of birth, ID band Patient awake    Reviewed: Allergy & Precautions, NPO status , Patient's Chart, lab work & pertinent test results, reviewed documented beta blocker date and time   Airway Mallampati: II  TM Distance: >3 FB Neck ROM: Full    Dental  (+) Edentulous Upper, Missing   Pulmonary COPD,  COPD inhaler, former smoker,    Pulmonary exam normal breath sounds clear to auscultation       Cardiovascular hypertension, Pt. on medications and Pt. on home beta blockers +CHF  Normal cardiovascular exam Rhythm:Regular Rate:Normal  ECG: SR, rate 69.   ECHO (2018) LV EF: 55% -   60%    Neuro/Psych negative neurological ROS  negative psych ROS   GI/Hepatic negative GI ROS, Neg liver ROS,   Endo/Other  diabetes  Renal/GU CRFRenal disease     Musculoskeletal Gout   Abdominal (+) + obese,   Peds  Hematology negative hematology ROS (+)   Anesthesia Other Findings RIGHT RENAL MASS  Reproductive/Obstetrics                            Anesthesia Physical Anesthesia Plan  ASA: III  Anesthesia Plan: General   Post-op Pain Management:    Induction: Intravenous  PONV Risk Score and Plan: 3 and Dexamethasone, Midazolam, Treatment may vary due to age or medical condition and Ondansetron  Airway Management Planned: Oral ETT  Additional Equipment: Arterial line  Intra-op Plan:   Post-operative Plan: Extubation in OR  Informed Consent: I have reviewed the patients History and Physical, chart, labs and discussed the procedure including the risks, benefits and alternatives for the proposed anesthesia with the patient or authorized representative who has indicated his/her understanding and acceptance.     Dental advisory given  Plan Discussed with: CRNA  Anesthesia Plan Comments: (Type and screen PAT note)       Anesthesia Quick  Evaluation

## 2020-05-16 ENCOUNTER — Encounter (HOSPITAL_COMMUNITY): Payer: Self-pay | Admitting: Urology

## 2020-05-16 ENCOUNTER — Inpatient Hospital Stay (HOSPITAL_COMMUNITY): Payer: Medicare Other

## 2020-05-16 ENCOUNTER — Inpatient Hospital Stay (HOSPITAL_COMMUNITY): Payer: Medicare Other | Admitting: Anesthesiology

## 2020-05-16 ENCOUNTER — Encounter (HOSPITAL_COMMUNITY)
Admission: RE | Disposition: A | Discharge status: Rehabilitation Facility | Payer: Self-pay | Source: Home / Self Care | Attending: Urology

## 2020-05-16 ENCOUNTER — Inpatient Hospital Stay (HOSPITAL_COMMUNITY): Payer: Medicare Other | Admitting: Physician Assistant

## 2020-05-16 ENCOUNTER — Inpatient Hospital Stay (HOSPITAL_COMMUNITY)
Admission: RE | Admit: 2020-05-16 | Discharge: 2020-06-10 | DRG: 656 | Disposition: A | Discharge status: Rehabilitation Facility | Payer: Medicare Other | Attending: Urology | Admitting: Urology

## 2020-05-16 DIAGNOSIS — J45909 Unspecified asthma, uncomplicated: Secondary | ICD-10-CM | POA: Diagnosis present

## 2020-05-16 DIAGNOSIS — Z833 Family history of diabetes mellitus: Secondary | ICD-10-CM | POA: Diagnosis not present

## 2020-05-16 DIAGNOSIS — N184 Chronic kidney disease, stage 4 (severe): Secondary | ICD-10-CM | POA: Diagnosis not present

## 2020-05-16 DIAGNOSIS — R918 Other nonspecific abnormal finding of lung field: Secondary | ICD-10-CM | POA: Diagnosis not present

## 2020-05-16 DIAGNOSIS — I509 Heart failure, unspecified: Secondary | ICD-10-CM | POA: Diagnosis not present

## 2020-05-16 DIAGNOSIS — Z7951 Long term (current) use of inhaled steroids: Secondary | ICD-10-CM

## 2020-05-16 DIAGNOSIS — Z79899 Other long term (current) drug therapy: Secondary | ICD-10-CM

## 2020-05-16 DIAGNOSIS — Z978 Presence of other specified devices: Secondary | ICD-10-CM

## 2020-05-16 DIAGNOSIS — N185 Chronic kidney disease, stage 5: Secondary | ICD-10-CM | POA: Diagnosis not present

## 2020-05-16 DIAGNOSIS — K660 Peritoneal adhesions (postprocedural) (postinfection): Secondary | ICD-10-CM | POA: Diagnosis present

## 2020-05-16 DIAGNOSIS — M109 Gout, unspecified: Secondary | ICD-10-CM | POA: Diagnosis present

## 2020-05-16 DIAGNOSIS — R578 Other shock: Secondary | ICD-10-CM | POA: Diagnosis not present

## 2020-05-16 DIAGNOSIS — C641 Malignant neoplasm of right kidney, except renal pelvis: Principal | ICD-10-CM | POA: Diagnosis present

## 2020-05-16 DIAGNOSIS — N183 Chronic kidney disease, stage 3 unspecified: Secondary | ICD-10-CM | POA: Diagnosis not present

## 2020-05-16 DIAGNOSIS — Z9889 Other specified postprocedural states: Secondary | ICD-10-CM

## 2020-05-16 DIAGNOSIS — T797XXA Traumatic subcutaneous emphysema, initial encounter: Secondary | ICD-10-CM | POA: Diagnosis not present

## 2020-05-16 DIAGNOSIS — R410 Disorientation, unspecified: Secondary | ICD-10-CM | POA: Diagnosis not present

## 2020-05-16 DIAGNOSIS — N186 End stage renal disease: Secondary | ICD-10-CM | POA: Diagnosis not present

## 2020-05-16 DIAGNOSIS — Z8249 Family history of ischemic heart disease and other diseases of the circulatory system: Secondary | ICD-10-CM | POA: Diagnosis not present

## 2020-05-16 DIAGNOSIS — A419 Sepsis, unspecified organism: Secondary | ICD-10-CM | POA: Diagnosis not present

## 2020-05-16 DIAGNOSIS — N2889 Other specified disorders of kidney and ureter: Secondary | ICD-10-CM | POA: Diagnosis not present

## 2020-05-16 DIAGNOSIS — Z20822 Contact with and (suspected) exposure to covid-19: Secondary | ICD-10-CM | POA: Diagnosis present

## 2020-05-16 DIAGNOSIS — E440 Moderate protein-calorie malnutrition: Secondary | ICD-10-CM | POA: Insufficient documentation

## 2020-05-16 DIAGNOSIS — I4892 Unspecified atrial flutter: Secondary | ICD-10-CM | POA: Diagnosis not present

## 2020-05-16 DIAGNOSIS — I953 Hypotension of hemodialysis: Secondary | ICD-10-CM | POA: Diagnosis present

## 2020-05-16 DIAGNOSIS — I129 Hypertensive chronic kidney disease with stage 1 through stage 4 chronic kidney disease, or unspecified chronic kidney disease: Secondary | ICD-10-CM | POA: Diagnosis not present

## 2020-05-16 DIAGNOSIS — E1165 Type 2 diabetes mellitus with hyperglycemia: Secondary | ICD-10-CM | POA: Diagnosis present

## 2020-05-16 DIAGNOSIS — J690 Pneumonitis due to inhalation of food and vomit: Secondary | ICD-10-CM | POA: Diagnosis not present

## 2020-05-16 DIAGNOSIS — E871 Hypo-osmolality and hyponatremia: Secondary | ICD-10-CM | POA: Diagnosis not present

## 2020-05-16 DIAGNOSIS — I472 Ventricular tachycardia: Secondary | ICD-10-CM | POA: Diagnosis not present

## 2020-05-16 DIAGNOSIS — Z9911 Dependence on respirator [ventilator] status: Secondary | ICD-10-CM

## 2020-05-16 DIAGNOSIS — I5033 Acute on chronic diastolic (congestive) heart failure: Secondary | ICD-10-CM | POA: Diagnosis not present

## 2020-05-16 DIAGNOSIS — Z6831 Body mass index (BMI) 31.0-31.9, adult: Secondary | ICD-10-CM

## 2020-05-16 DIAGNOSIS — J90 Pleural effusion, not elsewhere classified: Secondary | ICD-10-CM | POA: Diagnosis not present

## 2020-05-16 DIAGNOSIS — Z4682 Encounter for fitting and adjustment of non-vascular catheter: Secondary | ICD-10-CM | POA: Diagnosis not present

## 2020-05-16 DIAGNOSIS — Z452 Encounter for adjustment and management of vascular access device: Secondary | ICD-10-CM

## 2020-05-16 DIAGNOSIS — E119 Type 2 diabetes mellitus without complications: Secondary | ICD-10-CM | POA: Diagnosis not present

## 2020-05-16 DIAGNOSIS — R140 Abdominal distension (gaseous): Secondary | ICD-10-CM

## 2020-05-16 DIAGNOSIS — J449 Chronic obstructive pulmonary disease, unspecified: Secondary | ICD-10-CM | POA: Diagnosis present

## 2020-05-16 DIAGNOSIS — I959 Hypotension, unspecified: Secondary | ICD-10-CM

## 2020-05-16 DIAGNOSIS — Z4659 Encounter for fitting and adjustment of other gastrointestinal appliance and device: Secondary | ICD-10-CM

## 2020-05-16 DIAGNOSIS — R6521 Severe sepsis with septic shock: Secondary | ICD-10-CM | POA: Diagnosis not present

## 2020-05-16 DIAGNOSIS — I4819 Other persistent atrial fibrillation: Secondary | ICD-10-CM | POA: Diagnosis not present

## 2020-05-16 DIAGNOSIS — Y95 Nosocomial condition: Secondary | ICD-10-CM | POA: Diagnosis present

## 2020-05-16 DIAGNOSIS — Z794 Long term (current) use of insulin: Secondary | ICD-10-CM | POA: Diagnosis not present

## 2020-05-16 DIAGNOSIS — R9389 Abnormal findings on diagnostic imaging of other specified body structures: Secondary | ICD-10-CM

## 2020-05-16 DIAGNOSIS — G9341 Metabolic encephalopathy: Secondary | ICD-10-CM | POA: Diagnosis not present

## 2020-05-16 DIAGNOSIS — I132 Hypertensive heart and chronic kidney disease with heart failure and with stage 5 chronic kidney disease, or end stage renal disease: Secondary | ICD-10-CM | POA: Diagnosis not present

## 2020-05-16 DIAGNOSIS — R0902 Hypoxemia: Secondary | ICD-10-CM | POA: Diagnosis not present

## 2020-05-16 DIAGNOSIS — D62 Acute posthemorrhagic anemia: Secondary | ICD-10-CM | POA: Diagnosis not present

## 2020-05-16 DIAGNOSIS — N170 Acute kidney failure with tubular necrosis: Secondary | ICD-10-CM | POA: Diagnosis present

## 2020-05-16 DIAGNOSIS — K661 Hemoperitoneum: Secondary | ICD-10-CM | POA: Diagnosis not present

## 2020-05-16 DIAGNOSIS — C649 Malignant neoplasm of unspecified kidney, except renal pelvis: Secondary | ICD-10-CM | POA: Diagnosis present

## 2020-05-16 DIAGNOSIS — Z419 Encounter for procedure for purposes other than remedying health state, unspecified: Secondary | ICD-10-CM

## 2020-05-16 DIAGNOSIS — Z992 Dependence on renal dialysis: Secondary | ICD-10-CM | POA: Diagnosis not present

## 2020-05-16 DIAGNOSIS — N99820 Postprocedural hemorrhage and hematoma of a genitourinary system organ or structure following a genitourinary system procedure: Secondary | ICD-10-CM | POA: Diagnosis not present

## 2020-05-16 DIAGNOSIS — E1122 Type 2 diabetes mellitus with diabetic chronic kidney disease: Secondary | ICD-10-CM | POA: Diagnosis present

## 2020-05-16 DIAGNOSIS — I10 Essential (primary) hypertension: Secondary | ICD-10-CM | POA: Diagnosis not present

## 2020-05-16 DIAGNOSIS — E669 Obesity, unspecified: Secondary | ICD-10-CM | POA: Diagnosis not present

## 2020-05-16 DIAGNOSIS — I9581 Postprocedural hypotension: Secondary | ICD-10-CM | POA: Diagnosis not present

## 2020-05-16 DIAGNOSIS — Z9289 Personal history of other medical treatment: Secondary | ICD-10-CM

## 2020-05-16 DIAGNOSIS — K5900 Constipation, unspecified: Secondary | ICD-10-CM | POA: Diagnosis not present

## 2020-05-16 DIAGNOSIS — J9602 Acute respiratory failure with hypercapnia: Secondary | ICD-10-CM | POA: Diagnosis not present

## 2020-05-16 DIAGNOSIS — J189 Pneumonia, unspecified organism: Secondary | ICD-10-CM | POA: Diagnosis not present

## 2020-05-16 DIAGNOSIS — J9601 Acute respiratory failure with hypoxia: Secondary | ICD-10-CM | POA: Diagnosis not present

## 2020-05-16 DIAGNOSIS — K6389 Other specified diseases of intestine: Secondary | ICD-10-CM | POA: Diagnosis not present

## 2020-05-16 DIAGNOSIS — E7800 Pure hypercholesterolemia, unspecified: Secondary | ICD-10-CM | POA: Diagnosis present

## 2020-05-16 DIAGNOSIS — R0602 Shortness of breath: Secondary | ICD-10-CM

## 2020-05-16 DIAGNOSIS — N281 Cyst of kidney, acquired: Secondary | ICD-10-CM | POA: Diagnosis not present

## 2020-05-16 DIAGNOSIS — Z87891 Personal history of nicotine dependence: Secondary | ICD-10-CM

## 2020-05-16 DIAGNOSIS — K922 Gastrointestinal hemorrhage, unspecified: Secondary | ICD-10-CM | POA: Diagnosis not present

## 2020-05-16 DIAGNOSIS — E875 Hyperkalemia: Secondary | ICD-10-CM | POA: Diagnosis not present

## 2020-05-16 DIAGNOSIS — N19 Unspecified kidney failure: Secondary | ICD-10-CM | POA: Diagnosis not present

## 2020-05-16 DIAGNOSIS — J9811 Atelectasis: Secondary | ICD-10-CM | POA: Diagnosis not present

## 2020-05-16 DIAGNOSIS — N1831 Chronic kidney disease, stage 3a: Secondary | ICD-10-CM

## 2020-05-16 DIAGNOSIS — E46 Unspecified protein-calorie malnutrition: Secondary | ICD-10-CM | POA: Diagnosis not present

## 2020-05-16 DIAGNOSIS — I480 Paroxysmal atrial fibrillation: Secondary | ICD-10-CM

## 2020-05-16 DIAGNOSIS — Z905 Acquired absence of kidney: Secondary | ICD-10-CM

## 2020-05-16 DIAGNOSIS — E1129 Type 2 diabetes mellitus with other diabetic kidney complication: Secondary | ICD-10-CM | POA: Diagnosis not present

## 2020-05-16 DIAGNOSIS — Z8546 Personal history of malignant neoplasm of prostate: Secondary | ICD-10-CM

## 2020-05-16 DIAGNOSIS — I5022 Chronic systolic (congestive) heart failure: Secondary | ICD-10-CM | POA: Diagnosis not present

## 2020-05-16 DIAGNOSIS — I130 Hypertensive heart and chronic kidney disease with heart failure and stage 1 through stage 4 chronic kidney disease, or unspecified chronic kidney disease: Secondary | ICD-10-CM | POA: Diagnosis not present

## 2020-05-16 DIAGNOSIS — Z7982 Long term (current) use of aspirin: Secondary | ICD-10-CM | POA: Diagnosis not present

## 2020-05-16 DIAGNOSIS — I5042 Chronic combined systolic (congestive) and diastolic (congestive) heart failure: Secondary | ICD-10-CM | POA: Diagnosis not present

## 2020-05-16 DIAGNOSIS — T8182XA Emphysema (subcutaneous) resulting from a procedure, initial encounter: Secondary | ICD-10-CM | POA: Diagnosis not present

## 2020-05-16 DIAGNOSIS — Z4901 Encounter for fitting and adjustment of extracorporeal dialysis catheter: Secondary | ICD-10-CM | POA: Diagnosis not present

## 2020-05-16 DIAGNOSIS — I428 Other cardiomyopathies: Secondary | ICD-10-CM | POA: Diagnosis not present

## 2020-05-16 DIAGNOSIS — Z01818 Encounter for other preprocedural examination: Secondary | ICD-10-CM

## 2020-05-16 DIAGNOSIS — R110 Nausea: Secondary | ICD-10-CM

## 2020-05-16 DIAGNOSIS — I4891 Unspecified atrial fibrillation: Secondary | ICD-10-CM | POA: Diagnosis not present

## 2020-05-16 DIAGNOSIS — D631 Anemia in chronic kidney disease: Secondary | ICD-10-CM | POA: Diagnosis not present

## 2020-05-16 DIAGNOSIS — R5381 Other malaise: Secondary | ICD-10-CM | POA: Diagnosis not present

## 2020-05-16 DIAGNOSIS — R112 Nausea with vomiting, unspecified: Secondary | ICD-10-CM | POA: Diagnosis not present

## 2020-05-16 DIAGNOSIS — Z96653 Presence of artificial knee joint, bilateral: Secondary | ICD-10-CM | POA: Diagnosis present

## 2020-05-16 DIAGNOSIS — E1169 Type 2 diabetes mellitus with other specified complication: Secondary | ICD-10-CM | POA: Diagnosis not present

## 2020-05-16 DIAGNOSIS — K3189 Other diseases of stomach and duodenum: Secondary | ICD-10-CM | POA: Diagnosis not present

## 2020-05-16 DIAGNOSIS — R0683 Snoring: Secondary | ICD-10-CM | POA: Diagnosis not present

## 2020-05-16 DIAGNOSIS — I120 Hypertensive chronic kidney disease with stage 5 chronic kidney disease or end stage renal disease: Secondary | ICD-10-CM | POA: Diagnosis not present

## 2020-05-16 DIAGNOSIS — J9600 Acute respiratory failure, unspecified whether with hypoxia or hypercapnia: Secondary | ICD-10-CM | POA: Diagnosis not present

## 2020-05-16 DIAGNOSIS — E785 Hyperlipidemia, unspecified: Secondary | ICD-10-CM | POA: Diagnosis present

## 2020-05-16 DIAGNOSIS — R579 Shock, unspecified: Secondary | ICD-10-CM | POA: Diagnosis not present

## 2020-05-16 DIAGNOSIS — I9589 Other hypotension: Secondary | ICD-10-CM | POA: Diagnosis not present

## 2020-05-16 DIAGNOSIS — N179 Acute kidney failure, unspecified: Secondary | ICD-10-CM

## 2020-05-16 DIAGNOSIS — I700 Atherosclerosis of aorta: Secondary | ICD-10-CM | POA: Diagnosis not present

## 2020-05-16 DIAGNOSIS — M159 Polyosteoarthritis, unspecified: Secondary | ICD-10-CM | POA: Diagnosis not present

## 2020-05-16 DIAGNOSIS — R609 Edema, unspecified: Secondary | ICD-10-CM | POA: Diagnosis not present

## 2020-05-16 DIAGNOSIS — N2581 Secondary hyperparathyroidism of renal origin: Secondary | ICD-10-CM | POA: Diagnosis not present

## 2020-05-16 HISTORY — PX: ROBOT ASSISTED LAPAROSCOPIC NEPHRECTOMY: SHX5140

## 2020-05-16 LAB — BASIC METABOLIC PANEL
Anion gap: 9 (ref 5–15)
Anion gap: 10 (ref 5–15)
BUN: 44 mg/dL — ABNORMAL HIGH (ref 8–23)
BUN: 44 mg/dL — ABNORMAL HIGH (ref 8–23)
CO2: 22 mmol/L (ref 22–32)
CO2: 25 mmol/L (ref 22–32)
Calcium: 8.3 mg/dL — ABNORMAL LOW (ref 8.9–10.3)
Calcium: 8.4 mg/dL — ABNORMAL LOW (ref 8.9–10.3)
Chloride: 104 mmol/L (ref 98–111)
Chloride: 102 mmol/L (ref 98–111)
Creatinine, Ser: 2.23 mg/dL — ABNORMAL HIGH (ref 0.61–1.24)
Creatinine, Ser: 2.51 mg/dL — ABNORMAL HIGH (ref 0.61–1.24)
GFR calc Af Amer: 33 mL/min — ABNORMAL LOW (ref >60)
GFR calc Af Amer: 29 mL/min — ABNORMAL LOW (ref >60)
GFR calc non Af Amer: 28 mL/min — ABNORMAL LOW (ref >60)
GFR calc non Af Amer: 25 mL/min — ABNORMAL LOW (ref >60)
Glucose, Bld: 168 mg/dL — ABNORMAL HIGH (ref 70–99)
Glucose, Bld: 214 mg/dL — ABNORMAL HIGH (ref 70–99)
Potassium: 5 mmol/L (ref 3.5–5.1)
Potassium: 5.5 mmol/L — ABNORMAL HIGH (ref 3.5–5.1)
Sodium: 135 mmol/L (ref 135–145)
Sodium: 137 mmol/L (ref 135–145)

## 2020-05-16 LAB — MRSA PCR SCREENING: MRSA by PCR: NEGATIVE

## 2020-05-16 LAB — GLUCOSE, CAPILLARY
Glucose-Capillary: 127 mg/dL — ABNORMAL HIGH (ref 70–99)
Glucose-Capillary: 151 mg/dL — ABNORMAL HIGH (ref 70–99)

## 2020-05-16 LAB — HEMOGLOBIN AND HEMATOCRIT, BLOOD
HCT: 33.9 % — ABNORMAL LOW (ref 39.0–52.0)
HCT: 25.2 % — ABNORMAL LOW (ref 39.0–52.0)
Hemoglobin: 10.7 g/dL — ABNORMAL LOW (ref 13.0–17.0)
Hemoglobin: 8 g/dL — ABNORMAL LOW (ref 13.0–17.0)

## 2020-05-16 LAB — PREPARE RBC (CROSSMATCH)

## 2020-05-16 SURGERY — NEPHRECTOMY, RADICAL, ROBOT-ASSISTED, LAPAROSCOPIC, ADULT
Anesthesia: General | Site: Renal | Laterality: Right

## 2020-05-16 MED ORDER — DEXAMETHASONE SODIUM PHOSPHATE 10 MG/ML IJ SOLN
INTRAMUSCULAR | Status: AC
  Filled 2020-05-16: qty 1

## 2020-05-16 MED ORDER — OXYCODONE HCL 5 MG PO TABS
5.0000 mg | ORAL_TABLET | ORAL | Status: DC | PRN
  Administered 2020-05-17: 10 mg via ORAL
  Administered 2020-05-19 – 2020-05-30 (×2): 5 mg via ORAL
  Filled 2020-05-16: qty 1
  Filled 2020-05-16: qty 2
  Filled 2020-05-16: qty 1

## 2020-05-16 MED ORDER — LIDOCAINE HCL (CARDIAC) PF 100 MG/5ML IV SOSY
PREFILLED_SYRINGE | INTRAVENOUS | Status: DC | PRN
  Administered 2020-05-16: 60 mg via INTRAVENOUS

## 2020-05-16 MED ORDER — DIPHENHYDRAMINE HCL 50 MG/ML IJ SOLN: 12.5000 mg | Freq: Four times a day (QID) | INTRAMUSCULAR | Status: DC | PRN

## 2020-05-16 MED ORDER — ALBUMIN HUMAN 5 % IV SOLN: 12.5000 g | Freq: Once | INTRAVENOUS | Status: AC

## 2020-05-16 MED ORDER — TIZANIDINE HCL 4 MG PO TABS
4.0000 mg | ORAL_TABLET | Freq: Three times a day (TID) | ORAL | Status: DC | PRN
  Administered 2020-05-16: 4 mg via ORAL
  Filled 2020-05-16 (×2): qty 1

## 2020-05-16 MED ORDER — SUGAMMADEX SODIUM 500 MG/5ML IV SOLN
INTRAVENOUS | Status: DC | PRN
  Administered 2020-05-16: 500 mg via INTRAVENOUS

## 2020-05-16 MED ORDER — DEXAMETHASONE SODIUM PHOSPHATE 10 MG/ML IJ SOLN
INTRAMUSCULAR | Status: DC | PRN
  Administered 2020-05-16: 10 mg via INTRAVENOUS

## 2020-05-16 MED ORDER — ONDANSETRON HCL 4 MG/2ML IJ SOLN
INTRAMUSCULAR | Status: AC
  Filled 2020-05-16: qty 2

## 2020-05-16 MED ORDER — DOCUSATE SODIUM 100 MG PO CAPS
100.0000 mg | ORAL_CAPSULE | Freq: Two times a day (BID) | ORAL | Status: DC
  Administered 2020-05-16 – 2020-05-20 (×9): 100 mg via ORAL
  Filled 2020-05-16 (×9): qty 1

## 2020-05-16 MED ORDER — ACETAMINOPHEN 500 MG PO TABS
1000.0000 mg | ORAL_TABLET | Freq: Once | ORAL | Status: AC
  Administered 2020-05-16: 1000 mg via ORAL
  Filled 2020-05-16: qty 2

## 2020-05-16 MED ORDER — FENTANYL CITRATE (PF) 250 MCG/5ML IJ SOLN
INTRAMUSCULAR | Status: AC
  Filled 2020-05-16: qty 5

## 2020-05-16 MED ORDER — CHLORHEXIDINE GLUCONATE CLOTH 2 % EX PADS
6.0000 | MEDICATED_PAD | Freq: Every day | CUTANEOUS | Status: DC
  Administered 2020-05-16 – 2020-06-09 (×27): 6 via TOPICAL

## 2020-05-16 MED ORDER — MIDAZOLAM HCL 5 MG/5ML IJ SOLN
INTRAMUSCULAR | Status: DC | PRN
  Administered 2020-05-16 (×2): 1 mg via INTRAVENOUS

## 2020-05-16 MED ORDER — ALBUMIN HUMAN 5 % IV SOLN
INTRAVENOUS | Status: AC
  Filled 2020-05-16: qty 250

## 2020-05-16 MED ORDER — ALBUTEROL SULFATE (2.5 MG/3ML) 0.083% IN NEBU: 3.0000 mL | INHALATION_SOLUTION | Freq: Four times a day (QID) | RESPIRATORY_TRACT | Status: DC | PRN

## 2020-05-16 MED ORDER — FENTANYL CITRATE (PF) 250 MCG/5ML IJ SOLN
INTRAMUSCULAR | Status: DC | PRN
  Administered 2020-05-16: 50 ug via INTRAVENOUS
  Administered 2020-05-16: 75 ug via INTRAVENOUS
  Administered 2020-05-16: 100 ug via INTRAVENOUS
  Administered 2020-05-16: 50 ug via INTRAVENOUS
  Administered 2020-05-16: 25 ug via INTRAVENOUS

## 2020-05-16 MED ORDER — ROCURONIUM BROMIDE 100 MG/10ML IV SOLN
INTRAVENOUS | Status: DC | PRN
  Administered 2020-05-16 (×3): 20 mg via INTRAVENOUS
  Administered 2020-05-16: 60 mg via INTRAVENOUS
  Administered 2020-05-16: 20 mg via INTRAVENOUS

## 2020-05-16 MED ORDER — ONDANSETRON HCL 4 MG/2ML IJ SOLN
4.0000 mg | INTRAMUSCULAR | Status: DC | PRN
  Administered 2020-05-16 – 2020-05-29 (×6): 4 mg via INTRAVENOUS
  Filled 2020-05-16 (×7): qty 2

## 2020-05-16 MED ORDER — LIDOCAINE HCL 2 % IJ SOLN
INTRAMUSCULAR | Status: AC
  Filled 2020-05-16: qty 20

## 2020-05-16 MED ORDER — LACTATED RINGERS IR SOLN
Status: DC | PRN
  Administered 2020-05-16: 1

## 2020-05-16 MED ORDER — ORAL CARE MOUTH RINSE
15.0000 mL | Freq: Two times a day (BID) | OROMUCOSAL | Status: DC
  Administered 2020-05-16 – 2020-05-21 (×8): 15 mL via OROMUCOSAL

## 2020-05-16 MED ORDER — ROCURONIUM BROMIDE 10 MG/ML (PF) SYRINGE
PREFILLED_SYRINGE | INTRAVENOUS | Status: AC
  Filled 2020-05-16: qty 10

## 2020-05-16 MED ORDER — PHENYLEPHRINE HCL (PRESSORS) 10 MG/ML IV SOLN
INTRAVENOUS | Status: AC
  Filled 2020-05-16: qty 1

## 2020-05-16 MED ORDER — STERILE WATER FOR IRRIGATION IR SOLN
Status: DC | PRN
  Administered 2020-05-16: 1000 mL

## 2020-05-16 MED ORDER — ALBUMIN HUMAN 5 % IV SOLN: INTRAVENOUS | Status: DC | PRN

## 2020-05-16 MED ORDER — FENTANYL CITRATE (PF) 100 MCG/2ML IJ SOLN
25.0000 ug | INTRAMUSCULAR | Status: DC | PRN
  Administered 2020-05-16: 25 ug via INTRAVENOUS
  Administered 2020-05-16: 50 ug via INTRAVENOUS

## 2020-05-16 MED ORDER — CARVEDILOL 25 MG PO TABS
25.0000 mg | ORAL_TABLET | Freq: Two times a day (BID) | ORAL | Status: DC
  Administered 2020-05-16 – 2020-05-17 (×3): 25 mg via ORAL
  Filled 2020-05-16: qty 1
  Filled 2020-05-16 (×2): qty 2
  Filled 2020-05-16 (×2): qty 1
  Filled 2020-05-16: qty 2
  Filled 2020-05-16: qty 1

## 2020-05-16 MED ORDER — ONDANSETRON HCL 4 MG/2ML IJ SOLN
INTRAMUSCULAR | Status: DC | PRN
  Administered 2020-05-16: 4 mg via INTRAVENOUS

## 2020-05-16 MED ORDER — ACETAMINOPHEN 500 MG PO TABS
1000.0000 mg | ORAL_TABLET | Freq: Three times a day (TID) | ORAL | Status: AC
  Administered 2020-05-16 – 2020-05-17 (×3): 1000 mg via ORAL
  Filled 2020-05-16 (×3): qty 2

## 2020-05-16 MED ORDER — ORAL CARE MOUTH RINSE: 15.0000 mL | Freq: Once | OROMUCOSAL | Status: AC

## 2020-05-16 MED ORDER — ONDANSETRON HCL 4 MG/2ML IJ SOLN: 4.0000 mg | Freq: Once | INTRAMUSCULAR | Status: DC | PRN

## 2020-05-16 MED ORDER — CEFAZOLIN SODIUM-DEXTROSE 1-4 GM/50ML-% IV SOLN
1.0000 g | Freq: Three times a day (TID) | INTRAVENOUS | Status: AC
  Administered 2020-05-16 – 2020-05-17 (×2): 1 g via INTRAVENOUS
  Filled 2020-05-16 (×2): qty 50

## 2020-05-16 MED ORDER — SUGAMMADEX SODIUM 500 MG/5ML IV SOLN
INTRAVENOUS | Status: AC
  Filled 2020-05-16: qty 5

## 2020-05-16 MED ORDER — CHLORHEXIDINE GLUCONATE 0.12 % MT SOLN
15.0000 mL | Freq: Once | OROMUCOSAL | Status: AC
  Administered 2020-05-16: 15 mL via OROMUCOSAL

## 2020-05-16 MED ORDER — DIPHENHYDRAMINE HCL 12.5 MG/5ML PO ELIX: 12.5000 mg | ORAL_SOLUTION | Freq: Four times a day (QID) | ORAL | Status: DC | PRN

## 2020-05-16 MED ORDER — LIDOCAINE 2% (20 MG/ML) 5 ML SYRINGE
INTRAMUSCULAR | Status: AC
  Filled 2020-05-16: qty 5

## 2020-05-16 MED ORDER — MIDAZOLAM HCL 2 MG/2ML IJ SOLN
INTRAMUSCULAR | Status: AC
  Filled 2020-05-16: qty 2

## 2020-05-16 MED ORDER — PROPOFOL 10 MG/ML IV BOLUS
INTRAVENOUS | Status: AC
  Filled 2020-05-16: qty 20

## 2020-05-16 MED ORDER — FENTANYL CITRATE (PF) 100 MCG/2ML IJ SOLN
INTRAMUSCULAR | Status: AC
  Filled 2020-05-16: qty 2

## 2020-05-16 MED ORDER — KETAMINE HCL 10 MG/ML IJ SOLN
INTRAMUSCULAR | Status: AC
  Filled 2020-05-16: qty 1

## 2020-05-16 MED ORDER — LIDOCAINE 20MG/ML (2%) 15 ML SYRINGE OPTIME
INTRAMUSCULAR | Status: DC | PRN
  Administered 2020-05-16: 1.5 mg/kg/h via INTRAVENOUS

## 2020-05-16 MED ORDER — DEXTROSE-NACL 5-0.45 % IV SOLN: INTRAVENOUS | Status: DC

## 2020-05-16 MED ORDER — MOMETASONE FURO-FORMOTEROL FUM 200-5 MCG/ACT IN AERO
2.0000 | INHALATION_SPRAY | Freq: Two times a day (BID) | RESPIRATORY_TRACT | Status: DC
  Administered 2020-05-16 – 2020-05-20 (×7): 2 via RESPIRATORY_TRACT
  Filled 2020-05-16 (×2): qty 8.8

## 2020-05-16 MED ORDER — SODIUM CHLORIDE 0.9% FLUSH
INTRAVENOUS | Status: DC | PRN
  Administered 2020-05-16: 20 mL

## 2020-05-16 MED ORDER — LACTATED RINGERS IV SOLN: INTRAVENOUS | Status: DC

## 2020-05-16 MED ORDER — LACTATED RINGERS IV SOLN: INTRAVENOUS | Status: DC | PRN

## 2020-05-16 MED ORDER — PROPOFOL 10 MG/ML IV BOLUS
INTRAVENOUS | Status: DC | PRN
  Administered 2020-05-16: 100 mg via INTRAVENOUS
  Administered 2020-05-16: 50 mg via INTRAVENOUS

## 2020-05-16 MED ORDER — ALBUMIN HUMAN 5 % IV SOLN
25.0000 g | Freq: Once | INTRAVENOUS | Status: AC
  Administered 2020-05-16: 25 g via INTRAVENOUS

## 2020-05-16 MED ORDER — ALBUMIN HUMAN 5 % IV SOLN
INTRAVENOUS | Status: AC
  Administered 2020-05-16: 12.5 g via INTRAVENOUS
  Filled 2020-05-16: qty 250

## 2020-05-16 MED ORDER — MORPHINE SULFATE (PF) 4 MG/ML IV SOLN
2.0000 mg | INTRAVENOUS | Status: DC | PRN
  Administered 2020-05-16 (×2): 4 mg via INTRAVENOUS
  Administered 2020-05-17 – 2020-05-18 (×2): 2 mg via INTRAVENOUS
  Filled 2020-05-16 (×5): qty 1

## 2020-05-16 MED ORDER — SODIUM CHLORIDE 0.9% IV SOLUTION: Freq: Once | INTRAVENOUS | Status: AC

## 2020-05-16 MED ORDER — SODIUM CHLORIDE (PF) 0.9 % IJ SOLN
INTRAMUSCULAR | Status: AC
  Filled 2020-05-16: qty 20

## 2020-05-16 MED ORDER — BUPIVACAINE LIPOSOME 1.3 % IJ SUSP
20.0000 mL | Freq: Once | INTRAMUSCULAR | Status: AC
  Administered 2020-05-16: 10 mL
  Filled 2020-05-16: qty 20

## 2020-05-16 SURGICAL SUPPLY — 53 items
BAG LAPAROSCOPIC 12 15 PORT 16 (BASKET) ×1 IMPLANT
BAG RETRIEVAL 12/15 (BASKET) ×2
CHLORAPREP W/TINT 26 (MISCELLANEOUS) ×2 IMPLANT
CLIP VESOLOCK LG 6/CT PURPLE (CLIP) ×8 IMPLANT
CLIP VESOLOCK MED LG 6/CT (CLIP) ×2 IMPLANT
CLIP VESOLOCK XL 6/CT (CLIP) ×2 IMPLANT
COVER SURGICAL LIGHT HANDLE (MISCELLANEOUS) ×2 IMPLANT
COVER TIP SHEARS 8 DVNC (MISCELLANEOUS) ×1 IMPLANT
COVER TIP SHEARS 8MM DA VINCI (MISCELLANEOUS) ×2
COVER WAND RF STERILE (DRAPES) IMPLANT
CUTTER ECHEON FLEX ENDO 45 340 (ENDOMECHANICALS) ×2 IMPLANT
DECANTER SPIKE VIAL GLASS SM (MISCELLANEOUS) ×2 IMPLANT
DERMABOND ADVANCED (GAUZE/BANDAGES/DRESSINGS) ×1
DERMABOND ADVANCED .7 DNX12 (GAUZE/BANDAGES/DRESSINGS) ×1 IMPLANT
DRAPE ARM DVNC X/XI (DISPOSABLE) ×4 IMPLANT
DRAPE COLUMN DVNC XI (DISPOSABLE) ×1 IMPLANT
DRAPE DA VINCI XI ARM (DISPOSABLE) ×8
DRAPE DA VINCI XI COLUMN (DISPOSABLE) ×2
DRAPE INCISE IOBAN 66X45 STRL (DRAPES) ×2 IMPLANT
DRAPE SHEET LG 3/4 BI-LAMINATE (DRAPES) ×2 IMPLANT
ELECT PENCIL ROCKER SW 15FT (MISCELLANEOUS) ×2 IMPLANT
ELECT REM PT RETURN 15FT ADLT (MISCELLANEOUS) ×4 IMPLANT
GLOVE BIO SURGEON STRL SZ 6.5 (GLOVE) ×2 IMPLANT
GLOVE BIO SURGEON STRL SZ8 (GLOVE) ×4 IMPLANT
GLOVE BIOGEL PI IND STRL 8 (GLOVE) ×1 IMPLANT
GLOVE BIOGEL PI INDICATOR 8 (GLOVE) ×1
GOWN STRL REUS W/TWL LRG LVL3 (GOWN DISPOSABLE) ×6 IMPLANT
IRRIG SUCT STRYKERFLOW 2 WTIP (MISCELLANEOUS) ×2
IRRIGATION SUCT STRKRFLW 2 WTP (MISCELLANEOUS) ×1 IMPLANT
KIT BASIN (CUSTOM PROCEDURE TRAY) ×2 IMPLANT
KIT TURNOVER KIT A (KITS) IMPLANT
LOOP VESSEL MAXI BLUE (MISCELLANEOUS) IMPLANT
NEEDLE INSUFFLATION 14GA 120MM (NEEDLE) ×2 IMPLANT
PENCIL SMOKE EVACUATOR (MISCELLANEOUS) IMPLANT
PROTECTOR NERVE ULNAR (MISCELLANEOUS) ×4 IMPLANT
SEAL CANN UNIV 5-8 DVNC XI (MISCELLANEOUS) ×4 IMPLANT
SEAL XI 5MM-8MM UNIVERSAL (MISCELLANEOUS) ×8
SET TUBE SMOKE EVAC HIGH FLOW (TUBING) ×2 IMPLANT
SOLUTION ELECTROLUBE (MISCELLANEOUS) ×2 IMPLANT
SPONGE LAP 4X18 RFD (DISPOSABLE) IMPLANT
STAPLE RELOAD 45 WHT (STAPLE) ×3 IMPLANT
STAPLE RELOAD 45MM WHITE (STAPLE) ×6
SUT ETHILON 3 0 PS 1 (SUTURE) IMPLANT
SUT PDS AB 1 TP1 96 (SUTURE) ×2 IMPLANT
SUT VIC AB 2-0 SH 27 (SUTURE) ×2
SUT VIC AB 2-0 SH 27X BRD (SUTURE) ×1 IMPLANT
TAPE CLOTH 4X10 WHT NS (GAUZE/BANDAGES/DRESSINGS) ×2 IMPLANT
TOWEL OR NON WOVEN STRL DISP B (DISPOSABLE) ×2 IMPLANT
TRAY FOLEY MTR SLVR 16FR STAT (SET/KITS/TRAYS/PACK) ×2 IMPLANT
TRAY LAPAROSCOPIC (CUSTOM PROCEDURE TRAY) ×2 IMPLANT
TROCAR BLADELESS OPT 5 100 (ENDOMECHANICALS) ×2 IMPLANT
TROCAR XCEL 12X100 BLDLESS (ENDOMECHANICALS) ×2 IMPLANT
WATER STERILE IRR 1000ML POUR (IV SOLUTION) ×4 IMPLANT

## 2020-05-16 NOTE — Progress Notes (Signed)
Hematocrit  39.6 down to 25 Increasing abdominal swelling   Ordered stat CT abdomen 1 unit packed cells

## 2020-05-16 NOTE — Progress Notes (Signed)
S: Called by NSG to review CT scan.  1 - RIGHT Renal Neoplasm - s/p right robotic radical nephrectomy 05/16/20 for large Rt mass. Path pending.  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 S/p right nephrectomy 7/9. Initially near anuric POD 0. CT 7/9 PM unremarkablef left (solitary) kidney.  3- Acute Blood Loss Anemia - Hgb 8s pos-op (after 3L fluid). No tachycardia. CT with expected post-op changes, no large hematomas.   O: HR 78, SBP 117 NAD, AOx3 restign comfortably Abd soft. NO r/g. Rt port and extraction sites c/d/i. No flank ecchymoses.  Foley with concentrated appearing urine.  Receiving transfusion.  CT 7/9 (POD 0 ) with some air and fluid in nephrectomy bed / under liver as expected. No very large hematomas.   A/P: CT findings c/w expected post-op given removal of large mass. AT this point I only rec serial H/H, transfusion as in progress. NO indications for any sort of re-exploration of emboliation at this point, would consdier only if transfusion refracotry.  Greatly appreciate critical care team and nephrology team comanagement.

## 2020-05-16 NOTE — Op Note (Signed)
Preoperative diagnosis: Right renal mass  Postop diagnosis: Same  Procedure: 1.  right robot assisted laparoscopic radical nephrectomy 2.  Lysis of adhesions, moderate   Attending: Nicolette Bang, MD  Resident: Carmie Kanner, MD  Anesthesia: General  Estimated blood loss: 300 cc  Drains: 16 French Foley catheter  Specimens: right radical nephrectomy  Antibiotics: ancef  Findings:  1 artery and 1 vein. adehsions of the omentum to the anterior abdominal wall requiring 15 minutes to remove. 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  The mass was not amenable to partial nephrectomy.  After discussing treatment options patient decided to proceed with right robot assisted laparoscopic radical nephrectomy.  Procedure in detail: Prior to procedure consent was obtained. Patient was brought to the operating room and briefing was done sure correct patient, correct procedure, correct site.  General anesthesia was in administered patient was placed in the left lateral decubitus position.  a 38 French catheter was placed. their abdomen and flank was then prepped and draped usual sterile fashion.  A Veress needle was used to obtain pneumoperitoneum.  Once pneumoperitoneum was reestablished to 15 mmHg we then placed a 8 mm camera port lateral to the umbilicus at the lateral edge of rectus.  We then proceeded to place 3 more robotic ports. We then placed an assistant port inferior to the camera in the midline. We then placed a 19mm port for the liver retractor in the midline above the assistant port. We then docked the robot.  We then started this dissection by removing a moderate amount of anterior abdominal wall adhesions and adhesions to the liver.  We then dissected along the white line of Toldt.   We then reflected the colon medially.  We then proceeded to kocherize the duodenum. We then identified the psoas muscle.  Once this was done we traced it down to the iliac vessels and identified the ureter.  Once we identified the gonadal vein and ureter were then traced this to the renal hilum.  The renal vein and renal artery were skeletonized.  We did we identified one renal vein one renal artery.  Using the [employer] power stapler within ligated the renal artery.  Once this was done we then used a second staple load to ligate the renal vein.  We then used a hem-o-lock clips to ligate the gonadal vein and the ureter.  Once this was done we then freed the kidney from its lateral and posterior attachments.  We then used a Endo Catch bag to remove the specimen.  Once the specimen was in the Endo Catch bag we then inspected the retroperitoneum and noted no residual bleeding.  We then removed our instruments, undocked the robot, and released the pneumoperitoneum.  We then made a right lower quardant incision to remove the specimen.  Once the specimen was removed we then closed the camera and assistant ports with 0 Vicryl in interrupted fashion.  We then closed the  Right lower quadrant incision with 0 PDS in a running fashion.  We then closed the overlying skin with 2-0 Vicryl in running fashion.  The skin was then closed with staples.  This then concluded the procedure which was well tolerated by the patient.  Complications: None  Condition: Stable, x-rayed, transferred to PACU.  Plan: Patient is to  be admitted for inpatient stay. The foley catheter will be removed in the morning. They will be started on a clear liquid diet POD#1

## 2020-05-16 NOTE — Progress Notes (Signed)
Notified of CT result of "large volume right retroperitoneal hemorrhage with heterogeneous air. CCM MD requested results also be called to Urology Surgeon. Oncall surgeon Dr. Tresa Moore notified via phone. Dr. Tresa Moore acknowledged results. No new orders at this time

## 2020-05-16 NOTE — Progress Notes (Signed)
PACU phase I  Patients Blood pressure declining progressively during pacu care, lowest reading 78/59. Positioned patient in [place] position, bolus IV fluid in route. Dr Roanna Banning made aware and obtained verbal order foe Albumin IV. Pt alert and oriented x 4. No obvious distress. Continuing PACU care .

## 2020-05-16 NOTE — Transfer of Care (Signed)
Immediate Anesthesia Transfer of Care Note  Patient: Todd Robertson  Procedure(s) Performed: XI ROBOTIC ASSISTED LAPAROSCOPIC NEPHRECTOMY (Right Renal)  Patient Location: PACU  Anesthesia Type:General  Level of Consciousness: awake, drowsy and patient cooperative  Airway & Oxygen Therapy: Patient Spontanous Breathing and Patient connected to face mask oxygen  Post-op Assessment: Report given to RN and Post -op Vital signs reviewed and stable  Post vital signs: Reviewed and stable  Last Vitals:  Vitals Value Taken Time  BP 157/103 05/16/20 1118  Temp    Pulse 62 05/16/20 1122  Resp 13 05/16/20 1122  SpO2 88 % 05/16/20 1122  Vitals shown include unvalidated device data.  Last Pain:  Vitals:   05/16/20 0605  TempSrc:   PainSc: 0-No pain         Complications: No complications documented.

## 2020-05-16 NOTE — Consult Note (Signed)
NAME:  Todd Robertson, MRN:  [id number], DOB:  December 27, 1945, LOS: 0 ADMISSION DATE:  05/16/2020, CONSULTATION DATE:  7/9 REFERRING MD:  Alyson Ingles, CHIEF COMPLAINT:  Shock    Brief History   74 year old male w/ hx as outlined below. Presented 7/9 for planned right Nephrectomy. Went to the OR that day. EBL estimated at 340ml. Intraoperative anesthesia note unremarkable. While in the PACU had declining w/ systolic BPs in 63F. Placed in Trendelenburg; IVF boluses administered (3 liters crystalloid and 250 albumin). BP normalized but only made 89ml of urine.  Because of this PCCM was asked to assess for circulatory shock and concern about hemodynamics  History of present illness   -see above   Past Medical History  Renal Mass (+ renal cell cancer) Prostate cancer ([facility] at [facility]), CKD stage IIIb, NICM, EF most recently normalized in 2018, HTN, HL, DM type II Significant Hospital Events   7/9 admitted for planned right Nephrectomy. PCCM asked to see post-op for post-operative hypotension. Baseline hgb: 12.3; post-op hgb 10.7 (in PACU). EBL intra-op 373ml.   Consults:  PCCM  Procedures:    Significant Diagnostic Tests:    Micro Data:    Antimicrobials:  Ancef in OR   Interim history/subjective:  BP better   Objective   Blood pressure 136/73, pulse 65, temperature (Abnormal) 97.4 F (36.3 C), resp. rate 16, height 6\' 2"  (1.88 m), weight 126.6 kg, SpO2 100 %.        Intake/Output Summary (Last 24 hours) at 05/16/2020 1416 Last data filed at 05/16/2020 1400 Gross per 24 hour  Intake 3850 ml  Output 440 ml  Net 3410 ml   Filed Weights   05/16/20 0532  Weight: 126.6 kg    Examination: General: 74 year old male. Resting in bed no distress HENT: NCAT no JVD MMM Lungs: clear decreased bases no accessory use  Cardiovascular: RRR no MRG Abdomen: soft right lower abd robotic incision sites CD&I, + bowel sounds Extremities: warm dry + bowel sounds Neuro: awake and oriented GU:  cl yellow but concentrated   Resolved Hospital Problem list     Assessment & Plan:  Post-operative hypotension s/p right Nephrectomy for Renal cell cancer  -ddx: adverse rxn to meds vs residual anesthesia affect, hypovolemia (?bleeding EBL was 300, hgb post-op demonstrating almost 2 gm drop), doubt infection/sepsis  Plan Hold benazapril, coreg, bumex,  Repeat cbc-->tranfusion trigger would be 7 but if we think active bleeding may push trigger  Cont IVF Peripheral pressor protocol if needed (MAP goal >65) even more important w/ only one kidney  Ck cortisol  CKD stage 3-->oliguric currently. Certainly could have taken hit w/ hypotension  -baseline cr: 2.6 Plan Cont IVFs Ensure MAP > 65 Renal dose meds Strict I&O Am chemistry   NICM/HFpEF  Plan Tele  Gentle fluids  DM type II Plan Trend cbg  Best practice:  Diet: NPO Pain/Anxiety/Delirium protocol (if indicated): NA VAP protocol (if indicated): NA DVT prophylaxis: scd GI prophylaxis: NA Glucose control: ssi Mobility: adv as tolerated Code Status: full code Family Communication: pending Disposition: to ICU   Labs   CBC: Recent Labs  Lab 05/13/20 0845 05/16/20 1131  WBC 7.7  --   HGB 12.3* 10.7*  HCT 39.6 33.9*  MCV 96.4  --   PLT 182  --     Basic Metabolic Panel: Recent Labs  Lab 05/13/20 0845 05/16/20 1131  NA 140 135  K 4.6 5.0  CL 105 104  CO2 25 22  GLUCOSE 133* 168*  BUN 56* 44*  CREATININE 2.60* 2.23*  CALCIUM 9.2 8.3*   GFR: Estimated Creatinine Clearance: 42.4 mL/min (A) (by C-G formula based on SCr of 2.23 mg/dL (H)). Recent Labs  Lab 05/13/20 0845  WBC 7.7    Liver Function Tests: No results for input(s): AST, ALT, ALKPHOS, BILITOT, PROT, ALBUMIN in the last 168 hours. No results for input(s): LIPASE, AMYLASE in the last 168 hours. No results for input(s): AMMONIA in the last 168 hours.  ABG No results found for: PHART, PCO2ART, PO2ART, HCO3, TCO2, ACIDBASEDEF, O2SAT    Coagulation Profile: No results for input(s): INR, PROTIME in the last 168 hours.  Cardiac Enzymes: No results for input(s): CKTOTAL, CKMB, CKMBINDEX, TROPONINI in the last 168 hours.  HbA1C: Hgb A1c MFr Bld  Date/Time Value Ref Range Status  05/13/2020 08:45 AM 6.2 (H) 4.8 - 5.6 % Final    Comment:    (NOTE) Pre diabetes:          5.7%-6.4%  Diabetes:              >6.4%  Glycemic control for   <7.0% adults with diabetes   11/11/2017 03:03 PM 5.8 (H) 4.8 - 5.6 % Final    Comment:    (NOTE) Pre diabetes:          5.7%-6.4% Diabetes:              >6.4% Glycemic control for   <7.0% adults with diabetes     CBG: Recent Labs  Lab 05/13/20 0756 05/16/20 0532 05/16/20 1139  GLUCAP 121* 127* 151*    Review of Systems:   Not able   Past Medical History  He,  has a past medical history of Arthritis, Cancer ([place]), Cardiomyopathy, CKD (chronic kidney disease), DM2 (diabetes mellitus, type 2) ([place]), Gout, HTN (hypertension), Hypercholesterolemia, Nonischemic cardiomyopathy ([place]), and Overweight(278.02).   Surgical History    Past Surgical History:  Procedure Laterality Date   COLONOSCOPY     HERNIA REPAIR     KNEE ARTHROPLASTY Left 08/08/2017   Procedure: LEFT TOTAL KNEE ARTHROPLASTY WITH COMPUTER NAVIGATION;  Surgeon: Rod Can, MD;  Location: [place];  Service: Orthopedics;  Laterality: Left;  Needs RNFA   KNEE ARTHROPLASTY Right 11/17/2017   Procedure: RIGHT TOTAL KNEE ARTHROPLASTY WITH COMPUTER NAVIGATION;  Surgeon: Rod Can, MD;  [facility];  Service: Orthopedics;  Laterality: Right;  NEEDS RNFA     Social History   reports that he quit smoking about 35 years ago. His smoking use included cigarettes. He started smoking about 54 years ago. He has a 10.00 pack-year smoking history. He has never used smokeless tobacco. He reports that he does not drink alcohol and does not use drugs.   Family History   His family history includes Heart disease  in his father; Hypertension in his mother; Kidney disease in his mother and another family member.   Allergies No Known Allergies   Home Medications  Prior to Admission medications   Medication Sig Start Date End Date Taking? Authorizing Provider  acetaminophen (TYLENOL) 500 MG tablet Take 1,000 mg by mouth every 6 (six) hours as needed for moderate pain or headache.   Yes [provider]  albuterol (VENTOLIN HFA) 108 (90 Base) MCG/ACT inhaler Inhale 1-2 puffs into the lungs every 6 (six) hours as needed for wheezing or shortness of breath.    Yes [provider]  allopurinol (ZYLOPRIM) 300 MG tablet Take 300 mg by mouth every morning.  Yes [provider]  aspirin EC 81 MG tablet Take 81 mg by mouth daily.   Yes [provider]  atorvastatin (LIPITOR) 40 MG tablet Take 1 tablet (40 mg total) by mouth daily. Patient taking differently: Take 40 mg by mouth every evening.  08/16/14  Yes BranchAlphonse Guild, MD  benazepril (LOTENSIN) 20 MG tablet Take 20 mg by mouth every morning.    Yes [provider]  budesonide-formoterol (SYMBICORT) 160-4.5 MCG/ACT inhaler Inhale 2 puffs into the lungs 2 (two) times daily.   Yes [provider]  bumetanide (BUMEX) 1 MG tablet Take 1 mg by mouth See admin instructions. Take 1 mg daily, may take a second 1 mg dose as needed for swelling   Yes [provider]  carvedilol (COREG) 25 MG tablet Take 1 tablet (25 mg total) by mouth 2 (two) times daily. 08/13/14  Yes BranchAlphonse Guild, MD  Cholecalciferol (VITAMIN D) 50 MCG (2000 UT) tablet Take 2,000 Units by mouth daily.   Yes [provider]  loratadine (CLARITIN) 10 MG tablet Take 10 mg by mouth daily.   Yes [provider]  Multiple Vitamin (MULTIVITAMIN) tablet Take 1 tablet by mouth daily.   Yes [provider]  Tetrahydrozoline HCl (VISINE OP) Place 1 drop into both eyes daily as needed (dryness).   Yes [provider]  diclofenac Sodium (VOLTAREN) 1 % GEL Apply 1 application topically 4 (four) times daily as needed (pain).    [provider]  senna (SENOKOT) 8.6 MG tablet Take 1 tablet by mouth daily as needed for constipation.     [provider]  tiZANidine (ZANAFLEX) 4 MG tablet Take 4 mg by mouth every 8 (eight) hours as needed for muscle spasms.     [provider]     Critical care time: [place] ACNP-BC [place] Pager # [phone] OR # [phone] if no answer

## 2020-05-16 NOTE — Anesthesia Procedure Notes (Signed)
Procedure Name: Intubation Date/Time: 05/16/2020 7:54 AM Performed by: Glory Buff, CRNA Pre-anesthesia Checklist: Patient identified, Emergency Drugs available, Suction available and Patient being monitored Patient Re-evaluated:Patient Re-evaluated prior to induction Oxygen Delivery Method: Circle system utilized Preoxygenation: Pre-oxygenation with 100% oxygen Induction Type: IV induction Ventilation: Mask ventilation without difficulty Laryngoscope Size: Miller and 3 Grade View: Grade II Tube type: Oral Tube size: 7.5 mm Number of attempts: 1 Airway Equipment and Method: Stylet and Oral airway Placement Confirmation: ETT inserted through vocal cords under direct vision,  positive ETCO2 and breath sounds checked- equal and bilateral Secured at: 22 cm Tube secured with: Tape Dental Injury: Teeth and Oropharynx as per pre-operative assessment

## 2020-05-16 NOTE — Anesthesia Postprocedure Evaluation (Signed)
Anesthesia Post Note  Patient: Todd Robertson  Procedure(s) Performed: XI ROBOTIC ASSISTED LAPAROSCOPIC NEPHRECTOMY (Right Renal)     Patient location during evaluation: PACU Anesthesia Type: General Level of consciousness: awake and alert Pain management: pain level controlled Vital Signs Assessment: post-procedure vital signs reviewed and stable Respiratory status: spontaneous breathing, nonlabored ventilation, respiratory function stable and patient connected to nasal cannula oxygen Cardiovascular status: blood pressure returned to baseline and stable Postop Assessment: no apparent nausea or vomiting Anesthetic complications: no Comments: Arterial line removed in PACU. Patient later became hypotensive prior to transfer to the floor. Albumin and crystalloid given to address hypotension and decreased urine output. Surgeon updated. Decision made to transfer patient to unit. Blood pressure improved in PACU.   No complications documented.  Last Vitals:  Vitals:   05/16/20 2018 05/16/20 2100  BP: (!) 119/52 (!) 103/59  Pulse: 77 83  Resp: 17 17  Temp: 37.3 C   SpO2: 96% 99%    Last Pain:  Vitals:   05/16/20 2018  TempSrc: Oral  PainSc:                  Karyl Kinnier Mercie Balsley

## 2020-05-16 NOTE — Anesthesia Procedure Notes (Signed)
Arterial Line Insertion Start/End7/07/2020 6:55 AM, 05/16/2020 6:57 AM Performed by: Glory Buff, CRNA, CRNA  Patient location: Pre-op. Preanesthetic checklist: patient identified, IV checked, site marked, risks and benefits discussed, surgical consent, monitors and equipment checked, pre-op evaluation, timeout performed and anesthesia consent Lidocaine 1% used for infiltration Left, radial was placed Catheter size: 20 G Hand hygiene performed , maximum sterile barriers used  and Seldinger technique used Allen's test indicative of satisfactory collateral circulation Attempts: 1 Procedure performed without using ultrasound guided technique. Following insertion, dressing applied and Biopatch. Post procedure assessment: normal and unchanged  Patient tolerated the procedure well with no immediate complications.

## 2020-05-16 NOTE — H&P (Signed)
Right renal mass  HPI: Todd Robertson is a 74yo with a hx of right renal mass. 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 Creatinine is 2.6. NO significant LUTS.   PMH:     Past Medical History:  Diagnosis Date  . Arthritis   . Cancer [facility])    prostate  . Cardiomyopathy    secondary  . CKD (chronic kidney disease)   . DM2 (diabetes mellitus, type 2) ([place])   . Gout   . HTN (hypertension)    unspec  . Hypercholesterolemia   . Nonischemic cardiomyopathy ([place])    a. EF 40-50% by most recent 2-D echo b. EF 25% by cardiac cath in 2004  . Overweight(278.02)     Surgical History:      Past Surgical History:  Procedure Laterality Date  . COLONOSCOPY    . HERNIA REPAIR    . KNEE ARTHROPLASTY Left 08/08/2017   Procedure: LEFT TOTAL KNEE ARTHROPLASTY WITH COMPUTER NAVIGATION;  Surgeon: Rod Can, MD;  Location: [place];  Service: Orthopedics;  Laterality: Left;  Needs RNFA  . KNEE ARTHROPLASTY Right 11/17/2017   Procedure: RIGHT TOTAL KNEE ARTHROPLASTY WITH COMPUTER NAVIGATION;  Surgeon: Rod Can, MD;  Location[facility];  Service: Orthopedics;  Laterality: Right;  NEEDS RNFA    Home Medications:  Allergies as of 03/19/2020   No Known Allergies     Medication List       Accurate as of Mar 19, 2020  4:03 PM. If you have any questions, ask your nurse or doctor.        acetaminophen 325 MG tablet Commonly known as: TYLENOL Take by mouth.   albuterol 108 (90 Base) MCG/ACT inhaler Commonly known as: VENTOLIN HFA Inhale 1-2 puffs into the lungs every 6 (six) hours as needed for wheezing or shortness of breath.   allopurinol 300 MG tablet Commonly known as: ZYLOPRIM Take 300 mg by mouth every morning.   aspirin 81 MG chewable tablet Chew 1 tablet (81 mg total) by mouth 2 (two) times daily.   atorvastatin 40 MG tablet Commonly known as: LIPITOR Take 1 tablet (40 mg total) by mouth daily. What changed: when to take  this   benazepril 20 MG tablet Commonly known as: LOTENSIN Take 20 mg by mouth every morning.   budesonide-formoterol 160-4.5 MCG/ACT inhaler Commonly known as: SYMBICORT Inhale 1 puff into the lungs daily. may take 2nd dose in the evening if needed for shortness of breath   bumetanide 1 MG tablet Commonly known as: BUMEX Take 1 mg by mouth daily.   carvedilol 25 MG tablet Commonly known as: COREG Take 1 tablet (25 mg total) by mouth 2 (two) times daily.   cetirizine 10 MG tablet Commonly known as: ZYRTEC Take 10 mg by mouth daily.   [employer], forensic  USE AS DIRECTED   LUBRICATING EYE DROPS OP Apply 1 drop to eye daily.   Mucinex 600 MG 12 hr tablet Generic drug: guaiFENesin Take 600 mg by mouth 2 (two) times daily as needed for cough.   multivitamin tablet Take 1 tablet by mouth daily.   Muscle Rub 10-15 % Crea Apply 1 application topically as needed for muscle pain.   ondansetron 4 MG tablet Commonly known as: ZOFRAN Take 1 tablet (4 mg total) by mouth every 6 (six) hours as needed for nausea.   tiZANidine 4 MG tablet Commonly known as: ZANAFLEX Take 4 mg by mouth every 8 (eight) hours as needed for  muscle spasms.       Allergies: No Known Allergies  Family History:      Family History  Problem Relation Age of Onset  . Hypertension Mother   . Kidney disease Mother   . Heart disease Father   . Kidney disease Other     Social History:  reports that he quit smoking about 35 years ago. His smoking use included cigarettes. He started smoking about 54 years ago. He has a 10.00 pack-year smoking history. He has never used smokeless tobacco. He reports that he does not drink alcohol or use drugs.  ROS: All other review of systems were reviewed and are negative except what is noted above in HPI  Physical Exam: BP (!) 175/88   Pulse 74   Temp 98.1 F (36.7 C)   Ht 6\' 2"  (1.88 m)   Wt 275 lb  (124.7 kg)   BMI 35.31 kg/m   Constitutional:  Alert and oriented, No acute distress. HEENT: [place] AT, moist mucus membranes.  Trachea midline, no masses. Cardiovascular: No clubbing, cyanosis, or edema. Respiratory: Normal respiratory effort, no increased work of breathing. GI: Abdomen is soft, nontender, nondistended, no abdominal masses GU: No CVA tenderness.  Lymph: No cervical or inguinal lymphadenopathy. Skin: No rashes, bruises or suspicious lesions. Neurologic: Grossly intact, no focal deficits, moving all 4 extremities. Psychiatric: Normal mood and affect.  Laboratory Data:      Lab Results  Component Value Date   WBC 7.4 02/26/2020   HGB 11.9 (L) 02/26/2020   HCT 38.3 (L) 02/26/2020   MCV 94.6 02/26/2020   PLT 200 02/26/2020         Lab Results  Component Value Date   CREATININE 3.00 (H) 02/01/2020    No results found for: PSA  No results found for: TESTOSTERONE       Lab Results  Component Value Date   HGBA1C 5.8 (H) 11/11/2017    Urinalysis         Component Value Date/Time   BILIRUBINUR neg 02/13/2020 1122   PROTEINUR Negative 02/13/2020 1122   UROBILINOGEN 0.2 02/13/2020 1122   NITRITE neg 02/13/2020 1122   LEUKOCYTESUR Negative 02/13/2020 1122    No results found for: LABMICR, [place], RBCUA, LABEPIT, MUCUS, BACTERIA  Pertinent Imaging: CT biopsy 4/20: images reviewed and pathology reviewed and discussed with the patient . No results found for this or any previous visit. No results found for this or any previous visit. No results found for this or any previous visit. No results found for this or any previous visit.     Results for orders placed during the hospital encounter of 12/25/19  US RENAL   Narrative CLINICAL DATA:  Chronic kidney disease.  EXAM: RENAL / URINARY TRACT ULTRASOUND COMPLETE  COMPARISON:  None.  FINDINGS: Right Kidney:  Renal measurements: 14.0 x 8.9 x 7.9 cm = volume: 511 mL.  Complex avascular versus hypovascular mass in the lower lateral kidney measures 5.7 x 2.4 x 5.8 cm. Additional 2.9 x 2.4 x 2.5 cm cyst is seen in the mid kidney. No hydronephrosis.  Left Kidney:  Renal measurements: 12.9 x 6.5 x 5.7 cm = volume: 252 mL. Echogenicity within normal limits. Cyst in the upper medial kidney measures 1.6 x 1.5 x 1.6 cm. No solid mass or hydronephrosis visualized.  Bladder:  Nondistended and not well evaluated.  Other:  None.  IMPRESSION: 1. Complex right renal mass measuring greater than 5 cm. This is incompletely characterized by sonography. Recommend renal  protocol MRI (IV contrast if renal function permits). 2. Additional cysts in both kidneys. 3. Bladder nondistended and not evaluated.   Electronically Signed   By: Keith Rake M.D.   On: 12/25/2019 23:26    No results found for this or any previous visit. No results found for this or any previous visit.     Results for orders placed during the hospital encounter of 02/01/20  CT RENAL STONE STUDY   Narrative CLINICAL DATA:  Indeterminate right renal mass on recent ultrasound. Renal insufficiency.  EXAM: CT ABDOMEN AND PELVIS WITHOUT CONTRAST  TECHNIQUE: Multidetector CT imaging of the abdomen and pelvis was performed following the standard protocol without IV contrast.  COMPARISON:  12/25/2019 renal sonogram.  FINDINGS: Lower chest: No significant pulmonary nodules or acute consolidative airspace disease. Coronary atherosclerosis.  Hepatobiliary: Normal liver size. No liver mass. Normal gallbladder with no radiopaque cholelithiasis. No biliary ductal dilatation.  Pancreas: Normal, with no mass or duct dilation.  Spleen: Normal size. No mass.  Adrenals/Urinary Tract: Normal adrenals. Soft tissue density partially exophytic 6.4 x 5.4 cm renal cortical mass in the medial upper right kidney (series 2/image 24). Simple 2.8 cm posterior interpolar right  renal cyst. Simple 1.7 cm anterior lower left renal cyst. No renal stones. No hydronephrosis. Normal caliber ureters. No ureteral stones. Normal bladder.  Stomach/Bowel: Normal non-distended stomach. Normal caliber small bowel with no small bowel wall thickening. Normal appendix. Scattered minimal colonic diverticulosis with no large bowel wall thickening or significant pericolonic fat stranding.  Vascular/Lymphatic: Atherosclerotic nonaneurysmal abdominal aorta. No pathologically enlarged lymph nodes in the abdomen or pelvis.  Reproductive: Top-normal size prostate. Fiducial markers surrounding the prostate  Other: No pneumoperitoneum, ascites or focal fluid collection. Small fat containing umbilical hernia.  Musculoskeletal: No aggressive appearing focal osseous lesions. Marked lumbar spondylosis.  IMPRESSION: 1. Soft tissue density partially exophytic 6.4 cm renal cortical mass in the medial upper right kidney, incompletely characterized on this noncontrast study (patient unable to receive IV contrast due to GFR below 30). Renal cell carcinoma is to be excluded. If the patient's renal function improves (GFR greater than or equal to 30), renal mass protocol MRI (preferred) or CT abdomen without and with IV contrast is indicated for further evaluation. 2. No evidence of metastatic disease in the abdomen or pelvis on this noncontrast CT study. 3. Coronary atherosclerosis. 4. Aortic Atherosclerosis (ICD10-I70.0).   Electronically Signed   By: Ilona Sorrel M.D.   On: 02/02/2020 16:52     Assessment & Plan:    1. Renal cell carcinoma of right kidney (HCC) -We discussed the management including surveillance, radical nephrectomy, partial nephrectomy and ablation. After discussing the options the patient and family elect for radical nephrectomy. Risks/benefits/alternatives discussed.

## 2020-05-17 ENCOUNTER — Other Ambulatory Visit: Payer: Self-pay | Admitting: Pulmonary Disease

## 2020-05-17 ENCOUNTER — Other Ambulatory Visit: Payer: Self-pay

## 2020-05-17 ENCOUNTER — Encounter (HOSPITAL_COMMUNITY): Payer: Self-pay | Admitting: Urology

## 2020-05-17 DIAGNOSIS — N179 Acute kidney failure, unspecified: Secondary | ICD-10-CM

## 2020-05-17 LAB — BASIC METABOLIC PANEL
Anion gap: 8 (ref 5–15)
Anion gap: 11 (ref 5–15)
BUN: 51 mg/dL — ABNORMAL HIGH (ref 8–23)
BUN: 55 mg/dL — ABNORMAL HIGH (ref 8–23)
CO2: 27 mmol/L (ref 22–32)
CO2: 28 mmol/L (ref 22–32)
Calcium: 8.1 mg/dL — ABNORMAL LOW (ref 8.9–10.3)
Calcium: 8.1 mg/dL — ABNORMAL LOW (ref 8.9–10.3)
Chloride: 98 mmol/L (ref 98–111)
Chloride: 96 mmol/L — ABNORMAL LOW (ref 98–111)
Creatinine, Ser: 3.42 mg/dL — ABNORMAL HIGH (ref 0.61–1.24)
Creatinine, Ser: 3.89 mg/dL — ABNORMAL HIGH (ref 0.61–1.24)
GFR calc Af Amer: 20 mL/min — ABNORMAL LOW (ref >60)
GFR calc Af Amer: 17 mL/min — ABNORMAL LOW (ref >60)
GFR calc non Af Amer: 17 mL/min — ABNORMAL LOW (ref >60)
GFR calc non Af Amer: 14 mL/min — ABNORMAL LOW (ref >60)
Glucose, Bld: 210 mg/dL — ABNORMAL HIGH (ref 70–99)
Glucose, Bld: 208 mg/dL — ABNORMAL HIGH (ref 70–99)
Potassium: 4.8 mmol/L (ref 3.5–5.1)
Potassium: 4.8 mmol/L (ref 3.5–5.1)
Sodium: 133 mmol/L — ABNORMAL LOW (ref 135–145)
Sodium: 135 mmol/L (ref 135–145)

## 2020-05-17 LAB — HEMOGLOBIN AND HEMATOCRIT, BLOOD
HCT: 25.8 % — ABNORMAL LOW (ref 39.0–52.0)
HCT: 25.6 % — ABNORMAL LOW (ref 39.0–52.0)
Hemoglobin: 8.1 g/dL — ABNORMAL LOW (ref 13.0–17.0)
Hemoglobin: 8.1 g/dL — ABNORMAL LOW (ref 13.0–17.0)

## 2020-05-17 MED ORDER — PROMETHAZINE HCL 25 MG/ML IJ SOLN
12.5000 mg | Freq: Four times a day (QID) | INTRAMUSCULAR | Status: DC | PRN
  Administered 2020-05-17 – 2020-05-18 (×3): 12.5 mg via INTRAVENOUS
  Filled 2020-05-17 (×6): qty 1

## 2020-05-17 MED ORDER — FAMOTIDINE 20 MG PO TABS
10.0000 mg | ORAL_TABLET | Freq: Every day | ORAL | Status: DC
  Administered 2020-05-17 – 2020-05-20 (×4): 10 mg via ORAL
  Filled 2020-05-17 (×5): qty 1

## 2020-05-17 MED ORDER — ALUM & MAG HYDROXIDE-SIMETH 200-200-20 MG/5ML PO SUSP
30.0000 mL | ORAL | Status: DC | PRN
  Administered 2020-05-17 – 2020-05-19 (×3): 30 mL via ORAL
  Filled 2020-05-17 (×4): qty 30

## 2020-05-17 MED ORDER — ALUM & MAG HYDROXIDE-SIMETH 200-200-20 MG/5 ML NICU TOPICAL: 1.0000 "application " | TOPICAL | Status: DC | PRN

## 2020-05-17 NOTE — Progress Notes (Signed)
eLink Physician-Brief Progress Note Patient Name: Todd Robertson DOB: 1946/05/02 MRN: [id number]   Date of Service  05/17/2020  HPI/Events of Note  Patient c/o indigestion.  eICU Interventions  [place] ordered.        Kerry Kass Christabel Camire 05/17/2020, 5:16 AM

## 2020-05-17 NOTE — Progress Notes (Signed)
1 Day Post-Op   Subjective/Chief Complaint:  1 - RIGHT Renal Neoplasm - s/p right robotic radical nephrectomy 05/16/20 for large Rt mass. Path pending.  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 S/p right nephrectomy 7/9. Initially near anuric POD 0. CT 7/9 PM unremarkablef left (solitary) kidney.  3- Acute Blood Loss Anemia - Hgb 8s pos-op (after 3L fluid). No tachycardia. CT with expected post-op changes, no large hematomas. Transfusion x 1u pRBC 7/9.  Today "Gene" is stable. Hgb approx stable with transfusion. Some UOP, though not vigorous overnight. K acceptable. Pain controlled.    Objective: Vital signs in last 24 hours: Temp:  [97.4 F (36.3 C)-99.1 F (37.3 C)] 98.8 F (37.1 C) (07/10 0800) Pulse Rate:  [51-86] 86 (07/10 1000) Resp:  [9-27] 12 (07/10 1000) BP: (56-175)/(46-128) 175/83 (07/10 1000) SpO2:  [89 %-100 %] 97 % (07/10 1000) Arterial Line BP: (134-183)/(60-65) 134/60 (07/09 1200) Weight:  [132 kg] 132 kg (07/09 1635) Last BM Date: 05/15/20  Intake/Output from previous day: 07/09 0701 - 07/10 0700 In: 6265 [I.V.:4895; Blood:220; IV Piggyback:1150] Out: 665 [Urine:365; Blood:300] Intake/Output this shift: Total I/O In: 660 [P.O.:60; I.V.:600] Out: -   General appearance: alert, cooperative and very pleasant. CMA helping him wih sponge bath this AM.  Eyes: negative Nose: Nares normal. Septum midline. Mucosa normal. No drainage or sinus tenderness. Throat: lips, mucosa, and tongue normal; teeth and gums normal Neck: supple, symmetrical, trachea midline Back: symmetric, no curvature. ROM normal. No CVA tenderness. Resp: non-labored on [place] O2 Cardio: HR 70s on monitor, regular.  Male genitalia: normal, foley in place wtih concenrated appearing urine that is non-foul.  Extremities: extremities normal, atraumatic, no cyanosis or edema Skin: Skin color, texture, turgor normal. No rashes or lesions  Abd obese, no r/g. Rt sided  port and extraction sites c/d/i. No palpable hematomas / large flank ecchymoses.   Lab Results:  Recent Labs    05/17/20 0215 05/17/20 1049  HGB 8.1* 8.1*  HCT 25.8* 25.6*   BMET Recent Labs    05/16/20 1538 05/17/20 0215  NA 137 133*  K 5.5* 4.8  CL 102 98  CO2 25 27  GLUCOSE 214* 210*  BUN 44* 51*  CREATININE 2.51* 3.42*  CALCIUM 8.4* 8.1*   PT/INR No results for input(s): LABPROT, INR in the last 72 hours. ABG No results for input(s): PHART, HCO3 in the last 72 hours.  Invalid input(s): PCO2, PO2  Studies/Results: CT ABDOMEN WO CONTRAST  Result Date: 05/16/2020 CLINICAL DATA:  Abdominal swelling. Ascites suspected. Post nephrectomy with acute decrease in a area. Increased abdominal swelling. EXAM: CT ABDOMEN WITHOUT CONTRAST TECHNIQUE: Multidetector CT imaging of the abdomen was performed following the standard protocol without IV contrast. COMPARISON:  Preoperative CT 02/01/2020 FINDINGS: Lower chest: Small right pleural effusion. Linear atelectasis in the lingula and right lower lobe. Coronary artery calcifications. Small pericardial effusion. Hepatobiliary: Hemorrhage tracks adjacent to the liver in the right upper quadrant. No definite intrahepatic fluid collection or focal lesion. No calcified gallstone or biliary dilatation. Pancreas: No ductal dilatation or inflammation. Spleen: Normal spleen size. Perisplenic fluid measures simple fluid density. Adrenals/Urinary Tract: Post right nephrectomy. Large volume of hemorrhage in the right nephrectomy bed. Ill-defined air fluid collection tracks in the right retroperitoneum anterior to the iliopsoas muscle and laterally in the abdominal cavity. Hemorrhage tracks into the right aspect of the pelvis in the is only partially included in the field of view. There is moderate  subcutaneous emphysema involving the right abdominal wall. Small foci of air dissects between the right abdominal wall musculature. Simple cyst in the left kidney.  There is no left hydronephrosis. Left adrenal gland is normal. Right adrenal gland not well seen. Stomach/Bowel: Small hiatal hernia. Air-fluid level in the stomach which is distended. No evidence of bowel obstruction. Mild gaseous distention of colon which may be reactive. Normal appendix. Vascular/Lymphatic: Aortic atherosclerosis. No aortic aneurysm. No bulky abdominal adenopathy. Other: Free air related to recent right nephrectomy. Large amount of right retroperitoneal hemorrhage which tracks into the pelvis, partially included in the field of view. Heterogeneous air and fluid throughout the right retroperitoneum. Subcutaneous air in the right abdominal wall which tracks into the lower chest. Musculoskeletal: No acute osseous abnormalities. Stable bone island within L2. IMPRESSION: 1. Post right nephrectomy. Large volume of right retroperitoneal hemorrhage with heterogeneous air and fluid which tracks into the pelvis and right upper quadrant. 2. Moderate subcutaneous emphysema involving the right abdominal wall. 3. Small right pleural effusion. Small pericardial effusion. Aortic Atherosclerosis (ICD10-I70.0). These results were called by telephone at the time of interpretation on 05/16/2020 at 6:58 pm to Dr Sherrilyn Rist , who verbally acknowledged these results. Electronically Signed   By: Keith Rake M.D.   On: 05/16/2020 18:58    Anti-infectives: Anti-infectives (From admission, onward)   Start     Dose/Rate Route Frequency Ordered Stop   05/16/20 1700  ceFAZolin (ANCEF) IVPB 1 g/50 mL premix        1 g 100 mL/hr over 30 Minutes Intravenous Every 8 hours 05/16/20 1600 05/17/20 0206   05/16/20 0600  ceFAZolin (ANCEF) 3 g in dextrose 5 % 50 mL IVPB        3 g 100 mL/hr over 30 Minutes Intravenous 30 min pre-op 05/15/20 0725 05/16/20 1944      Assessment/Plan:  1 - RIGHT Renal Neoplasm - no furhter cancer directed therapy this admission.   2 - Acute on Chronic Renal Failure / Solitary  Left Kidney - oliguric.Solitary kidney w/o hydro or significant atrophy on imaging this admission.  Agree with vigorous hydration and PRN transfusion to optimize pre-renal factors. Appreciate CCM and nephrology comanagement.  Fortunately K and voluem status still adequate.   3- Acute Blood Loss Anemia - stabilizing now. Low suspicion for any significant ongoing bleeding. Hgb drift at this point likley more equilbration / dilutional. Agree with PRN transfusion again to optimise pre-renal factors.   Alexis Frock 05/17/2020

## 2020-05-17 NOTE — Progress Notes (Signed)
NAME:  Todd Robertson, MRN:  [id number], DOB:  [birth year], LOS: 1 ADMISSION DATE:  05/16/2020, CONSULTATION DATE:  05/16/2020 REFERRING MD:  Dr Alyson Ingles, CHIEF COMPLAINT:  Shock   Brief History    74 year old male w/ hx as outlined below. Presented 7/9 for planned right Nephrectomy. Went to the OR that day. EBL estimated at 3101ml. Intraoperative anesthesia note unremarkable. While in the PACU had declining w/ systolic BPs in 76L. Placed in Trendelenburg; IVF boluses administered (3 liters crystalloid and 250 albumin). BP normalized but only made 35ml of urine.  Because of this PCCM was asked to assess for circulatory shock and concern about hemodynamics  Past Medical History   Past Medical History:  Diagnosis Date  . Arthritis   . Cancer [facility])    prostate  . Cardiomyopathy    secondary  . CKD (chronic kidney disease)   . DM2 (diabetes mellitus, type 2) ([place])   . Gout   . HTN (hypertension)    unspec  . Hypercholesterolemia   . Nonischemic cardiomyopathy ([place])    a. EF 40-50% by most recent 2-D echo b. EF 25% by cardiac cath in 2004  . Overweight(278.02)      Significant Hospital Events   05/16/2020-right nephrectomy PCCM asked to see post-op for post-operative hypotension. Baseline hgb: 12.3; post-op hgb 10.7 (in PACU). EBL intra-op 323ml.  Consults:  pccm  Procedures:    Significant Diagnostic Tests:  CT abdomen 7/9 IMPRESSION: 1. Post right nephrectomy. Large volume of right retroperitoneal hemorrhage with heterogeneous air and fluid which tracks into the pelvis and right upper quadrant. 2. Moderate subcutaneous emphysema involving the right abdominal wall. 3. Small right pleural effusion. Small pericardial effusion. Micro Data:    Antimicrobials:  7/9-cefazolin  Interim history/subjective:  No overnight events, some nausea  Objective   Blood pressure 133/65, pulse 83, temperature 98.8 F (37.1 C), temperature source Oral, resp. rate 19, height 6\' 2"  (1.88  m), weight 132 kg, SpO2 92 %.        Intake/Output Summary (Last 24 hours) at 05/17/2020 0842 Last data filed at 05/17/2020 0801 Gross per 24 hour  Intake 6367.5 ml  Output 665 ml  Net 5702.5 ml   Filed Weights   05/16/20 0532 05/16/20 1635  Weight: 126.6 kg 132 kg    Examination: General: Elderly gentleman does not appear to be in respiratory distress HENT: Moist oral mucosa Lungs: Good air entry bilaterally Cardiovascular: S1-S2 appreciated Abdomen: Full, nontender, ecchymosis around incision line Extremities: No edema, no clubbing Neuro: Alert and oriented x3 GU: Output of [facility] Problem list     Assessment & Plan:  Postop hypotension, s/p nephrectomy for renal cell cancer -Hypotension is better -Antihypertensives on hold -May require pressors if reemergence of hypotension  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 -Slight bump in creatinine -We will consult renal -Renal dose medications -Trend electrolytes   Type 2 diabetes -SSI  Heart failure with preserved ejection fraction -Cautious hydration in the context of his acute kidney injury  Renal cell cancer -Status post nephrectomy  Past history of smoking -On bronchodilators -will continue inhalers  Will ask renal to see  Best practice:  Diet: Renal diet Pain/Anxiety/Delirium protocol (if indicated): Roxicodone VAP protocol (if indicated): Not indicated DVT prophylaxis: SCD GI prophylaxis: pepcid Glucose control: monitor Mobility: bedrest Code Status: full Family Communication: patient alert and aware Disposition: stepdown   Labs   CBC: Recent Labs  Lab 05/13/20 0845 05/16/20 1131  05/16/20 1538 05/17/20 0215  WBC 7.7  --   --   --   HGB 12.3* 10.7* 8.0* 8.1*  HCT 39.6 33.9* 25.2* 25.8*  MCV 96.4  --   --   --   PLT 182  --   --   --     Basic Metabolic Panel: Recent Labs  Lab 05/13/20 0845 05/16/20 1131 05/16/20 1538 05/17/20 0215  NA 140 135 137  133*  K 4.6 5.0 5.5* 4.8  CL 105 104 102 98  CO2 25 22 25 27   GLUCOSE 133* 168* 214* 210*  BUN 56* 44* 44* 51*  CREATININE 2.60* 2.23* 2.51* 3.42*  CALCIUM 9.2 8.3* 8.4* 8.1*   GFR: Estimated Creatinine Clearance: 28.2 mL/min (A) (by C-G formula based on SCr of 3.42 mg/dL (H)). Recent Labs  Lab 05/13/20 0845  WBC 7.7    Liver Function Tests: No results for input(s): AST, ALT, ALKPHOS, BILITOT, PROT, ALBUMIN in the last 168 hours. No results for input(s): LIPASE, AMYLASE in the last 168 hours. No results for input(s): AMMONIA in the last 168 hours.  ABG No results found for: PHART, PCO2ART, PO2ART, HCO3, TCO2, ACIDBASEDEF, O2SAT   Coagulation Profile: No results for input(s): INR, PROTIME in the last 168 hours.  Cardiac Enzymes: No results for input(s): CKTOTAL, CKMB, CKMBINDEX, TROPONINI in the last 168 hours.  HbA1C: Hgb A1c MFr Bld  Date/Time Value Ref Range Status  05/13/2020 08:45 AM 6.2 (H) 4.8 - 5.6 % Final    Comment:    (NOTE) Pre diabetes:          5.7%-6.4%  Diabetes:              >6.4%  Glycemic control for   <7.0% adults with diabetes   11/11/2017 03:03 PM 5.8 (H) 4.8 - 5.6 % Final    Comment:    (NOTE) Pre diabetes:          5.7%-6.4% Diabetes:              >6.4% Glycemic control for   <7.0% adults with diabetes     CBG: Recent Labs  Lab 05/13/20 0756 05/16/20 0532 05/16/20 1139  GLUCAP 121* 127* 151*    Review of Systems:   Awake and alert nausea  Past Medical History  He,  has a past medical history of Arthritis, Cancer ([place]), Cardiomyopathy, CKD (chronic kidney disease), DM2 (diabetes mellitus, type 2) ([place]), Gout, HTN (hypertension), Hypercholesterolemia, Nonischemic cardiomyopathy ([place]), and Overweight(278.02).   Surgical History    Past Surgical History:  Procedure Laterality Date  . COLONOSCOPY    . HERNIA REPAIR    . KNEE ARTHROPLASTY Left 08/08/2017   Procedure: LEFT TOTAL KNEE ARTHROPLASTY WITH COMPUTER NAVIGATION;   Surgeon: Rod Can, MD;  Location: [place];  Service: Orthopedics;  Laterality: Left;  Needs RNFA  . KNEE ARTHROPLASTY Right 11/17/2017   Procedure: RIGHT TOTAL KNEE ARTHROPLASTY WITH COMPUTER NAVIGATION;  Surgeon: Rod Can, MD;  Location: [facility];  Service: Orthopedics;  Laterality: Right;  NEEDS RNFA  . ROBOT ASSISTED LAPAROSCOPIC NEPHRECTOMY Right 05/16/2020   Procedure: XI ROBOTIC ASSISTED LAPAROSCOPIC NEPHRECTOMY;  Surgeon: Cleon Gustin, MD;  Location: [facility];  Service: Urology;  Laterality: Right;  2.5 hrs     Social History   reports that he quit smoking about 35 years ago. His smoking use included cigarettes. He started smoking about 54 years ago. He has a 10.00 pack-year smoking history. He has never used smokeless tobacco. He reports that  he does not drink alcohol and does not use drugs.   Family History   His family history includes Heart disease in his father; Hypertension in his mother; Kidney disease in his mother and another family member.   Allergies No Known Allergies   Sherrilyn Rist, MD [place] [facility] Pager: [phone]

## 2020-05-17 NOTE — Consult Note (Signed)
Renal Service Consult Note Todd Robertson 05/17/2020 Todd Robertson Requesting Physician:  Todd Robertson  Reason for Consult: Acute on chronic renal failure HPI: The patient is a 74 y.o. year-old w/ hx of NICM, HL, HTN, overweight, gout, DM2, CKD and prostate cancer presented w/ renal mass on R kidney. Biopsy showed papillary RCC.  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 Pt admitted yest and underwent R robotic -assist nephrectomy. Some post op BP's were in the low 100's. Today BP's back up to 191- 478'G systolic.  UOP today only 270 cc. Creat rising up to 3.4 and 3.8 today (Baseline creat 2.5- 2.7). asked to see for renal failure.   Pt seen in ICU bed. No c/o's this am, foley in place, no SOB / cough or leg swelling.    Pt's family is in the room, pt sees [facility] for CKD, they are seen in the [place] office.  Daughter lives in [place] and pt and his wife are in [place] outside of [place].   IN patient meds : po coreg 25 bid  I/O 6.2 L in and 1 L out   ROS  denies CP  no joint pain   no HA  no blurry vision  no rash  no diarrhea  no nausea/ vomiting    Past Medical History  Past Medical History:  Diagnosis Date   Arthritis    Cancer ([place])    prostate   Cardiomyopathy    secondary   CKD (chronic kidney disease)    DM2 (diabetes mellitus, type 2) (HCC)    Gout    HTN (hypertension)    unspec   Hypercholesterolemia    Nonischemic cardiomyopathy ([place])    a. EF 40-50% by most recent 2-D echo b. EF 25% by cardiac cath in 2004   Overweight(278.02)    Past Surgical History  Past Surgical History:  Procedure Laterality Date   COLONOSCOPY     HERNIA REPAIR     KNEE ARTHROPLASTY Left 08/08/2017   Procedure: LEFT TOTAL KNEE ARTHROPLASTY WITH COMPUTER NAVIGATION;  Surgeon: Todd Can, MD;  Location: [facility];  Service: Orthopedics;  Laterality: Left;  Needs RNFA   KNEE ARTHROPLASTY Right 11/17/2017   Procedure: RIGHT TOTAL  KNEE ARTHROPLASTY WITH COMPUTER NAVIGATION;  Surgeon: Todd Can, MD;  Location[facility];  Service: Orthopedics;  Laterality: Right;  NEEDS RNFA   ROBOT ASSISTED LAPAROSCOPIC NEPHRECTOMY Right 05/16/2020   Procedure: XI ROBOTIC ASSISTED LAPAROSCOPIC NEPHRECTOMY;  Surgeon: Todd Gustin, MD;  [facility];  Service: Urology;  Laterality: Right;  2.5 hrs   Family History  Family History  Problem Relation Age of Onset   Hypertension Mother    Kidney disease Mother    Heart disease Father    Kidney disease Other    Social History  reports that he quit smoking about 35 years ago. His smoking use included cigarettes. He started smoking about 54 years ago. He has a 10.00 pack-year smoking history. He has never used smokeless tobacco. He reports that he does not drink alcohol and does not use drugs. Allergies No Known Allergies Home medications Prior to Admission medications   Medication Sig Start Date End Date Taking? Authorizing Provider  acetaminophen (TYLENOL) 500 MG tablet Take 1,000 mg by mouth every 6 (six) hours as needed for moderate pain or headache.   Yes [provider]  albuterol (VENTOLIN HFA) 108 (90 Base) MCG/ACT inhaler Inhale 1-2 puffs into the lungs  every 6 (six) hours as needed for wheezing or shortness of breath.    Yes [provider]  allopurinol (ZYLOPRIM) 300 MG tablet Take 300 mg by mouth every morning.   Yes [provider]  aspirin EC 81 MG tablet Take 81 mg by mouth daily.   Yes [provider]  atorvastatin (LIPITOR) 40 MG tablet Take 1 tablet (40 mg total) by mouth daily. Patient taking differently: Take 40 mg by mouth every evening.  08/16/14  Yes BranchAlphonse Guild, MD  benazepril (LOTENSIN) 20 MG tablet Take 20 mg by mouth every morning.    Yes [provider]  budesonide-formoterol (SYMBICORT) 160-4.5 MCG/ACT inhaler Inhale 2 puffs into the lungs 2 (two) times daily.   Yes [provider]   bumetanide (BUMEX) 1 MG tablet Take 1 mg by mouth See admin instructions. Take 1 mg daily, may take a second 1 mg dose as needed for swelling   Yes [provider]  carvedilol (COREG) 25 MG tablet Take 1 tablet (25 mg total) by mouth 2 (two) times daily. 08/13/14  Yes BranchAlphonse Guild, MD  Cholecalciferol (VITAMIN D) 50 MCG (2000 UT) tablet Take 2,000 Units by mouth daily.   Yes [provider]  loratadine (CLARITIN) 10 MG tablet Take 10 mg by mouth daily.   Yes [provider]  Multiple Vitamin (MULTIVITAMIN) tablet Take 1 tablet by mouth daily.   Yes [provider]  Tetrahydrozoline HCl (VISINE OP) Place 1 drop into both eyes daily as needed (dryness).   Yes [provider]  diclofenac Sodium (VOLTAREN) 1 % GEL Apply 1 application topically 4 (four) times daily as needed (pain).    [provider]  senna (SENOKOT) 8.6 MG tablet Take 1 tablet by mouth daily as needed for constipation.     [provider]  tiZANidine (ZANAFLEX) 4 MG tablet Take 4 mg by mouth every 8 (eight) hours as needed for muscle spasms.     [provider]     Vitals:   05/17/20 0800 05/17/20 1000 05/17/20 1125 05/17/20 1200  BP:  (!) 175/83 (!) 163/66 (!) 143/53  Pulse:  86 93 76  Resp:  12 19 18   Temp: 98.8 F (37.1 C)     TempSrc: Oral     SpO2:  97% 99% 95%  Weight:      Height:       Exam Gen large-framed AAM no distress, drowsy No rash, cyanosis or gangrene Sclera anicteric, throat clear  No jvd or bruits Chest clear bilat to bases no rales or wheezing RRR no MRG Abd marked abd obesity, lap wounds in R abdomen, soft, nontender GU normal male w/ foley draining clear yellow urine MS no joint effusions or deformity Ext trace pretib edema, no wounds or ulcers Neuro is alert, Ox 3 , nf    Home meds:  - asa 58m/ lipitor 40 hs/ benazepril 20 am/ bumex 163mqd/ coreg 25 bid  - allopurinol 300 hs  - symbicort ACT 2 puff bid  -  prn's/ vitamins/ supplements  Date    Creat  egFR   2018   1.79- 2.38 30- 43   2019   1.80- 1.81 42-43   January 12, 2020   3.00     May 13, 2020  2.60  27   Jul 9   2.51  29- 33   Jul 10   3.42, 3.89    UA none yet   CT abd wo  contrast 7/9 - Adrenals/Urinary Tract: Post right nephrectomy. Large volume of hemorrhage in the right nephrectomy bed. Ill-defined air fluid collection tracks in the right retroperitoneum anterior to the iliopsoas muscle and laterally in the abdominal cavity. Hemorrhage tracks into the right aspect of the pelvis in the is only partially included in the field of view. There is moderate subcutaneous emphysema involving the right abdominal wall. Small foci of air dissects between the right abdominal wall musculature. Simple cyst in the left kidney. There is no left hydronephrosis.    BP 143/ 63  RH 85  RR 18- 23  Temp 98   Recorded UOP yest 200 cc, today 250 cc    Na 135 K 4.8 BUN 55 Cr 3.89  (preop Cr 2.23)  CO2 28  CA 8.1  Hb 8.1    Assessment/ Plan: 1. AoCKD IV - b/l creat 2.60, eGFR 27. AKI due to tissue loss after R nephrectomy. Creat up expectedly. No uremic signs, do not anticipate HD will be needed. Vol stable, will lower IVF to 100 cc/hr. Recheck labs in am. Meds reviewed, no issues noted. Will follow.  2. Renal mass - sp R nephrectomy on 05/16/20 3. Anemia ckd / abl - Hb down 12 > 10 > 8.1 this am, seems to be leveling off. Follow. Transfuse if < 7.0 or as needed.  4. HTN - cont coreg w/ hold orders if SBP < 120 for now 5. H/o NICM      Rob Meoshia Billing  MD 05/17/2020, 1:02 PM  Recent Labs  Lab 05/13/20 0845 05/16/20 1131 05/17/20 0215 05/17/20 1049  WBC 7.7  --   --   --   HGB 12.3*   < > 8.1* 8.1*   < > = values in this interval not displayed.   Recent Labs  Lab 05/17/20 0215 05/17/20 1049  K 4.8 4.8  BUN 51* 55*  CREATININE 3.42* 3.89*  CALCIUM 8.1* 8.1*

## 2020-05-18 ENCOUNTER — Inpatient Hospital Stay (HOSPITAL_COMMUNITY): Payer: Medicare Other

## 2020-05-18 DIAGNOSIS — N170 Acute kidney failure with tubular necrosis: Secondary | ICD-10-CM

## 2020-05-18 LAB — BASIC METABOLIC PANEL
Anion gap: 10 (ref 5–15)
BUN: 66 mg/dL — ABNORMAL HIGH (ref 8–23)
CO2: 29 mmol/L (ref 22–32)
Calcium: 8.3 mg/dL — ABNORMAL LOW (ref 8.9–10.3)
Chloride: 95 mmol/L — ABNORMAL LOW (ref 98–111)
Creatinine, Ser: 5.11 mg/dL — ABNORMAL HIGH (ref 0.61–1.24)
GFR calc Af Amer: 12 mL/min — ABNORMAL LOW (ref >60)
GFR calc non Af Amer: 10 mL/min — ABNORMAL LOW (ref >60)
Glucose, Bld: 200 mg/dL — ABNORMAL HIGH (ref 70–99)
Potassium: 4.9 mmol/L (ref 3.5–5.1)
Sodium: 134 mmol/L — ABNORMAL LOW (ref 135–145)

## 2020-05-18 LAB — CBC WITH DIFFERENTIAL/PLATELET
Abs Immature Granulocytes: 0.08 10*3/uL — ABNORMAL HIGH (ref 0.00–0.07)
Basophils Absolute: 0 10*3/uL (ref 0.0–0.1)
Basophils Relative: 0 %
Eosinophils Absolute: 0 10*3/uL (ref 0.0–0.5)
Eosinophils Relative: 0 %
HCT: 24.5 % — ABNORMAL LOW (ref 39.0–52.0)
Hemoglobin: 7.6 g/dL — ABNORMAL LOW (ref 13.0–17.0)
Immature Granulocytes: 0 %
Lymphocytes Relative: 3 %
Lymphs Abs: 0.7 10*3/uL (ref 0.7–4.0)
MCH: 28.5 pg (ref 26.0–34.0)
MCHC: 31 g/dL (ref 30.0–36.0)
MCV: 91.8 fL (ref 80.0–100.0)
Monocytes Absolute: 2.5 10*3/uL — ABNORMAL HIGH (ref 0.1–1.0)
Monocytes Relative: 12 %
Neutro Abs: 16.9 10*3/uL — ABNORMAL HIGH (ref 1.7–7.7)
Neutrophils Relative %: 85 %
Platelets: 141 10*3/uL — ABNORMAL LOW (ref 150–400)
RBC: 2.67 MIL/uL — ABNORMAL LOW (ref 4.22–5.81)
RDW: 16.9 % — ABNORMAL HIGH (ref 11.5–15.5)
WBC: 20.2 10*3/uL — ABNORMAL HIGH (ref 4.0–10.5)
nRBC: 0 % (ref 0.0–0.2)

## 2020-05-18 LAB — COMPREHENSIVE METABOLIC PANEL
ALT: 8 U/L (ref 0–44)
AST: 25 U/L (ref 15–41)
Albumin: 3.1 g/dL — ABNORMAL LOW (ref 3.5–5.0)
Alkaline Phosphatase: 42 U/L (ref 38–126)
Anion gap: 9 (ref 5–15)
BUN: 74 mg/dL — ABNORMAL HIGH (ref 8–23)
CO2: 25 mmol/L (ref 22–32)
Calcium: 8 mg/dL — ABNORMAL LOW (ref 8.9–10.3)
Chloride: 98 mmol/L (ref 98–111)
Creatinine, Ser: 6.66 mg/dL — ABNORMAL HIGH (ref 0.61–1.24)
GFR calc Af Amer: 9 mL/min — ABNORMAL LOW (ref >60)
GFR calc non Af Amer: 8 mL/min — ABNORMAL LOW (ref >60)
Glucose, Bld: 160 mg/dL — ABNORMAL HIGH (ref 70–99)
Potassium: 5.7 mmol/L — ABNORMAL HIGH (ref 3.5–5.1)
Sodium: 132 mmol/L — ABNORMAL LOW (ref 135–145)
Total Bilirubin: 0.7 mg/dL (ref 0.3–1.2)
Total Protein: 5.4 g/dL — ABNORMAL LOW (ref 6.5–8.1)

## 2020-05-18 LAB — HEMOGLOBIN AND HEMATOCRIT, BLOOD
HCT: 24.3 % — ABNORMAL LOW (ref 39.0–52.0)
Hemoglobin: 7.5 g/dL — ABNORMAL LOW (ref 13.0–17.0)

## 2020-05-18 LAB — GLUCOSE, CAPILLARY
Glucose-Capillary: 156 mg/dL — ABNORMAL HIGH (ref 70–99)
Glucose-Capillary: 134 mg/dL — ABNORMAL HIGH (ref 70–99)

## 2020-05-18 LAB — PREPARE RBC (CROSSMATCH)

## 2020-05-18 MED ORDER — BISACODYL 10 MG RE SUPP
10.0000 mg | Freq: Once | RECTAL | Status: AC
  Administered 2020-05-18: 10 mg via RECTAL
  Filled 2020-05-18: qty 1

## 2020-05-18 MED ORDER — SODIUM CHLORIDE 0.9% IV SOLUTION: Freq: Once | INTRAVENOUS | Status: AC

## 2020-05-18 MED ORDER — MENTHOL 3 MG MT LOZG
1.0000 | LOZENGE | OROMUCOSAL | Status: DC | PRN
  Administered 2020-05-19: 3 mg via ORAL
  Filled 2020-05-18: qty 9

## 2020-05-18 MED ORDER — HYDROMORPHONE HCL 1 MG/ML IJ SOLN
0.5000 mg | Freq: Four times a day (QID) | INTRAMUSCULAR | Status: AC | PRN
  Administered 2020-05-18: 0.5 mg via INTRAVENOUS
  Filled 2020-05-18: qty 1

## 2020-05-18 MED ORDER — METOCLOPRAMIDE HCL 5 MG/ML IJ SOLN
10.0000 mg | Freq: Three times a day (TID) | INTRAMUSCULAR | Status: DC
  Administered 2020-05-18 – 2020-05-19 (×4): 10 mg via INTRAVENOUS
  Filled 2020-05-18 (×4): qty 2

## 2020-05-18 MED ORDER — INSULIN ASPART 100 UNIT/ML ~~LOC~~ SOLN
0.0000 [IU] | Freq: Three times a day (TID) | SUBCUTANEOUS | Status: DC
  Administered 2020-05-18 – 2020-05-19 (×4): 2 [IU] via SUBCUTANEOUS
  Administered 2020-05-19 – 2020-05-20 (×2): 1 [IU] via SUBCUTANEOUS
  Administered 2020-05-20: 2 [IU] via SUBCUTANEOUS
  Administered 2020-05-20: 5 [IU] via SUBCUTANEOUS

## 2020-05-18 NOTE — Progress Notes (Signed)
[facility] Progress Note  Subjective: seen in room, no c/o', no SOB or cough, lying flat. UOP 330 yesterday. Creat up to 5. No nausea or confusion.   Vitals:   05/18/20 0400 05/18/20 0800 05/18/20 0908 05/18/20 1200  BP: (!) 148/74 (!) 144/70  111/70  Pulse: 94 95  90  Resp: (!) 23 (!) 25  (!) 22  Temp:  99.5 F (37.5 C)    TempSrc:  Oral    SpO2: 93% 90% 91% 99%  Weight:      Height:        Exam: Gen large-framed AAM no distress, drowsy but responsive No jvd or bruits Chest clear bilat to bases no rales or wheezing RRR no MRG Abd marked abd obesity, lap wounds in R abdomen, soft, nontender GU normal male w/ foley draining clear yellow urine MS no joint effusions or deformity Ext trace pretib edema, no wounds or ulcers Neuro is Ox 3 , nf    Home meds:  - asa 82m/ lipitor 40 hs/ benazepril 20 am/ bumex 132mqd/ coreg 25 bid  - allopurinol 300 hs  - symbicort ACT 2 puff bid  - prn's/ vitamins/ supplements  Date                             Creat               egFR   2018                          1.79- 2.38        30- 43   2019                          1.80- 1.81        42-43   January 12, 2020           3.00                    May 13, 2020                2.60                 27   Jul 9                           2.51                 29- 33   Jul 10                         3.42, 3.89    UA none yet   CT abd wo contrast 7/9 - Adrenals/Urinary Tract: Post right nephrectomy. Large volume of hemorrhage in the right nephrectomy bed. Ill-defined air fluid collection tracks in the right retroperitoneum anterior to the iliopsoas muscle and laterally in the abdominal cavity. Hemorrhage tracks into the right aspect of the pelvis in the is only partially included in the field of view. There is moderate subcutaneous emphysema involving the right abdominal wall. Small foci of air dissects between the right abdominal wall musculature. Simple cyst in the left kidney. There  is no left hydronephrosis.    BP 143/ 63  RH 85  RR 18- 23  Temp 98   Recorded UOP yest 200  cc, today 250 cc    Na 135 K 4.8 BUN 55 Cr 3.89  (preop Cr 2.23)  CO2 28  CA 8.1  Hb 8.1    Assessment/ Plan: 1. AoCKD IV - b/l creat 2.60, eGFR 27. AKI after R nephrectomy on 7/9. UOP remains poor, not sure etiology. BP's are not low, getting IVF's, no contrast or other renal toxins. Will need HD soon if does not start to improve. Recommend transfer to [facility].  2. Renal mass - sp R nephrectomy on 05/16/20 3. Anemia ckd / abl - Hb down 12 > 10 > 8.1> 7.6 this am. Follow. Transfuse if < 7.0 or as needed.  4. HTN/ vol - will dc coreg, will continue IVF's but at a lower rate 50/ hr, CXR today mild vasc congestion. 5. Somnolence - will dc MSO4 and use low-dose IV dilaudid which is safer in renal failure, use po pain meds preferred         Todd Robertson 05/18/2020, 1:17 PM   Recent Labs  Lab 05/17/20 1049 05/18/20 0240  K 4.8 4.9  BUN 55* 66*  CREATININE 3.89* 5.11*  CALCIUM 8.1* 8.3*  HGB 8.1* 7.6*   Inpatient medications: . sodium chloride   Intravenous Once  . Chlorhexidine Gluconate Cloth  6 each Topical Daily  . docusate sodium  100 mg Oral BID  . famotidine  10 mg Oral Daily  . insulin aspart  0-9 Units Subcutaneous TID WC  . mouth rinse  15 mL Mouth Rinse BID  . metoCLOPramide (REGLAN) injection  10 mg Intravenous Q8H  . mometasone-formoterol  2 puff Inhalation BID   . dextrose 5 % and 0.45% NaCl 125 mL/hr at 05/18/20 0918   albuterol, alum & mag hydroxide-simeth, diphenhydrAMINE **OR** diphenhydrAMINE, morphine injection, ondansetron, oxyCODONE, promethazine, tiZANidine

## 2020-05-18 NOTE — Progress Notes (Signed)
NAME:  Todd Robertson, MRN:  [id number], DOB:  1945-12-25, LOS: 2 ADMISSION DATE:  05/16/2020, CONSULTATION DATE:  05/16/2020 REFERRING MD:  Dr Alyson Ingles, CHIEF COMPLAINT:  Shock   Brief History    74 year old male w/ hx as outlined below. Presented 7/9 for planned right Nephrectomy. Went to the OR that day. EBL estimated at 376ml. Intraoperative anesthesia note unremarkable. While in the PACU had declining w/ systolic BPs in 62H. Placed in Trendelenburg; IVF boluses administered (3 liters crystalloid and 250 albumin). BP normalized but only made 44ml of urine.  Because of this PCCM was asked to assess for circulatory shock and concern about hemodynamics  Past Medical History   Past Medical History:  Diagnosis Date  . Arthritis   . Cancer [facility])    prostate  . Cardiomyopathy    secondary  . CKD (chronic kidney disease)   . DM2 (diabetes mellitus, type 2) ([place])   . Gout   . HTN (hypertension)    unspec  . Hypercholesterolemia   . Nonischemic cardiomyopathy ([place])    a. EF 40-50% by most recent 2-D echo b. EF 25% by cardiac cath in 2004  . Overweight(278.02)      Significant Hospital Events   05/16/2020-right nephrectomy PCCM asked to see post-op for post-operative hypotension. Baseline hgb: 12.3; post-op hgb 10.7 (in PACU). EBL intra-op 338ml.  Consults:  pccm  Procedures:    Significant Diagnostic Tests:  CT abdomen 7/9 IMPRESSION: 1. Post right nephrectomy. Large volume of right retroperitoneal hemorrhage with heterogeneous air and fluid which tracks into the pelvis and right upper quadrant. 2. Moderate subcutaneous emphysema involving the right abdominal wall. 3. Small right pleural effusion. Small pericardial effusion.  Micro Data:    Antimicrobials:  7/9-cefazolin  Interim history/subjective:  No overnight events Persistent nausea, No appetite  Objective   Blood pressure 111/70, pulse 90, temperature 99.5 F (37.5 C), temperature source Oral, resp. rate  (!) 22, height 6\' 2"  (1.88 m), weight 132 kg, SpO2 99 %.        Intake/Output Summary (Last 24 hours) at 05/18/2020 1339 Last data filed at 05/18/2020 0700 Gross per 24 hour  Intake 1854.17 ml  Output 305 ml  Net 1549.17 ml   Filed Weights   05/16/20 0532 05/16/20 1635  Weight: 126.6 kg 132 kg    Examination: General: Elderly gentleman does not appear to be in respiratory distress HENT: Moist oral mucosa Lungs: Good air entry bilaterally Cardiovascular: S1-S2 appreciated Abdomen: Full, nontender, ecchymosis around incision line Extremities: No edema, no clubbing Neuro: Alert and oriented x3 GU: Output of [facility] Problem list     Assessment & Plan:  Postop hypotension, s/p nephrectomy for renal cell cancer -Hypotension is better -Antihypertensives on hold -May require pressors if reemergence of hypotension -S/p transfusion x1, another unit requested  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 -Bump in creatinine not stabilizing at present -Appreciate renal input -Renal dose medications -Trend electrolytes   Type 2 diabetes -SSI  Heart failure with preserved ejection fraction -Cautious hydration in the context of his acute kidney injury  Renal cell cancer -Status post nephrectomy  Past history of smoking -On bronchodilators -will continue inhalers  With worsening BUN/creatinine, patient being transferred to [facility] for possible initiation of dialysis if renal functions do not start to improve No evidence of uremia at present  Best practice:  Diet: Renal diet Pain/Anxiety/Delirium protocol (if indicated): Roxicodone VAP protocol (if indicated): Not indicated DVT  prophylaxis: SCD GI prophylaxis: pepcid Glucose control: monitor Mobility: bedrest Code Status: full Family Communication: Discussed with patient, spouse and daughter at bedside Disposition: ICU  Labs   CBC: Recent Labs  Lab 05/13/20 0845 05/13/20 0845  05/16/20 1131 05/16/20 1538 05/17/20 0215 05/17/20 1049 05/18/20 0240  WBC 7.7  --   --   --   --   --  20.2*  NEUTROABS  --   --   --   --   --   --  16.9*  HGB 12.3*   < > 10.7* 8.0* 8.1* 8.1* 7.6*  HCT 39.6   < > 33.9* 25.2* 25.8* 25.6* 24.5*  MCV 96.4  --   --   --   --   --  91.8  PLT 182  --   --   --   --   --  141*   < > = values in this interval not displayed.    Basic Metabolic Panel: Recent Labs  Lab 05/16/20 1131 05/16/20 1538 05/17/20 0215 05/17/20 1049 05/18/20 0240  NA 135 137 133* 135 134*  K 5.0 5.5* 4.8 4.8 4.9  CL 104 102 98 96* 95*  CO2 22 25 27 28 29   GLUCOSE 168* 214* 210* 208* 200*  BUN 44* 44* 51* 55* 66*  CREATININE 2.23* 2.51* 3.42* 3.89* 5.11*  CALCIUM 8.3* 8.4* 8.1* 8.1* 8.3*   GFR: Estimated Creatinine Clearance: 18.9 mL/min (A) (by C-G formula based on SCr of 5.11 mg/dL (H)). Recent Labs  Lab 05/13/20 0845 05/18/20 0240  WBC 7.7 20.2*    Liver Function Tests: No results for input(s): AST, ALT, ALKPHOS, BILITOT, PROT, ALBUMIN in the last 168 hours. No results for input(s): LIPASE, AMYLASE in the last 168 hours. No results for input(s): AMMONIA in the last 168 hours.  ABG No results found for: PHART, PCO2ART, PO2ART, HCO3, TCO2, ACIDBASEDEF, O2SAT   Coagulation Profile: No results for input(s): INR, PROTIME in the last 168 hours.  Cardiac Enzymes: No results for input(s): CKTOTAL, CKMB, CKMBINDEX, TROPONINI in the last 168 hours.  HbA1C: Hgb A1c MFr Bld  Date/Time Value Ref Range Status  05/13/2020 08:45 AM 6.2 (H) 4.8 - 5.6 % Final    Comment:    (NOTE) Pre diabetes:          5.7%-6.4%  Diabetes:              >6.4%  Glycemic control for   <7.0% adults with diabetes   11/11/2017 03:03 PM 5.8 (H) 4.8 - 5.6 % Final    Comment:    (NOTE) Pre diabetes:          5.7%-6.4% Diabetes:              >6.4% Glycemic control for   <7.0% adults with diabetes     CBG: Recent Labs  Lab 05/13/20 0756 05/16/20 0532  05/16/20 1139  GLUCAP 121* 127* 151*    Review of Systems:   Awake and alert nausea  Past Medical History  He,  has a past medical history of Arthritis, Cancer ([place]), Cardiomyopathy, CKD (chronic kidney disease), DM2 (diabetes mellitus, type 2) ([place]), Gout, HTN (hypertension), Hypercholesterolemia, Nonischemic cardiomyopathy ([place]), and Overweight(278.02).   Surgical History    Past Surgical History:  Procedure Laterality Date  . COLONOSCOPY    . HERNIA REPAIR    . KNEE ARTHROPLASTY Left 08/08/2017   Procedure: LEFT TOTAL KNEE ARTHROPLASTY WITH COMPUTER NAVIGATION;  Surgeon: Rod Can, MD;  Location: [facility];  Service: Orthopedics;  Laterality: Left;  Needs RNFA  . KNEE ARTHROPLASTY Right 11/17/2017   Procedure: RIGHT TOTAL KNEE ARTHROPLASTY WITH COMPUTER NAVIGATION;  Surgeon: Rod Can, MD;  [facility];  Service: Orthopedics;  Laterality: Right;  NEEDS RNFA  . ROBOT ASSISTED LAPAROSCOPIC NEPHRECTOMY Right 05/16/2020   Procedure: XI ROBOTIC ASSISTED LAPAROSCOPIC NEPHRECTOMY;  Surgeon: Cleon Gustin, MD;  Location[facility];  Service: Urology;  Laterality: Right;  2.5 hrs     Social History   reports that he quit smoking about 35 years ago. His smoking use included cigarettes. He started smoking about 54 years ago. He has a 10.00 pack-year smoking history. He has never used smokeless tobacco. He reports that he does not drink alcohol and does not use drugs.   Family History   His family history includes Heart disease in his father; Hypertension in his mother; Kidney disease in his mother and another family member.   Allergies No Known Allergies   The patient is critically ill with multiple organ systems failure and requires high complexity decision making for assessment and support, frequent evaluation and titration of therapies, application of advanced monitoring technologies and extensive interpretation of multiple databases. Critical Care Time devoted to patient  care services described in this note independent of APP/resident time (if applicable)  is 30 minutes.   Sherrilyn Rist MD [facility] Personal pager: [phone] If unanswered, please page CCM On-call: [phone]

## 2020-05-18 NOTE — Progress Notes (Signed)
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 Path pending.  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 S/p right nephrectomy 7/9. Initially near anuric POD 0. CT 7/9 PM unremarkablef left (solitary) kidney.  3- Acute Blood Loss Anemia - Hgb 8s pos-op (after 3L fluid). No tachycardia. CT with expected post-op changes, no large hematomas. Transfusion x 1u pRBC 7/9.  4 - Post-Op Ileus - pt initially tollerating PO intake post-op. Some new emesis POD 2 and abd exam with new gasseous distension. Still passing flatus. Startign Q8 IV reglan 7/11. Suppository given 7/11.   Today "Gene" is not making much progress. Some emesis overnight. Still passing "lots of gas" UOP up some, but still quite low.    Objective: Vital signs in last 24 hours: Temp:  [99.1 F (37.3 C)-99.4 F (37.4 C)] 99.1 F (37.3 C) (07/11 0342) Pulse Rate:  [76-97] 94 (07/11 0400) Resp:  [12-26] 23 (07/11 0400) BP: (125-175)/(53-87) 148/74 (07/11 0400) SpO2:  [92 %-100 %] 93 % (07/11 0400) Last BM Date: 05/15/20  Intake/Output from previous day: 07/10 0701 - 07/11 0700 In: 2814.2 [P.O.:60; I.V.:2754.2] Out: 430 [Urine:330; Emesis/NG output:100] Intake/Output this shift: No intake/output data recorded.   General appearance: alert, cooperative and very pleasant.  Eyes: negative Nose: Nares normal. Septum midline. Mucosa normal. No drainage or sinus tenderness. Throat: lips, mucosa, and tongue normal; teeth and gums normal Neck: supple, symmetrical, trachea midline Back: symmetric, no curvature. ROM normal. No CVA tenderness. Resp: non-labored on [place] O2 Cardio: HR 70s on monitor, regular.  Male genitalia: normal, foley in place wtih concenrated appearing urine that is non-foul.  Extremities: extremities normal, atraumatic, no cyanosis or edema Skin: Skin color, texture, turgor normal. No  rashes or lesions  Abd obese, no r/g. Rt sided port and extraction sites c/d/i. No palpable hematomas / large flank ecchymoses. Some new distension and gaseous tympany today.   Lab Results:  Recent Labs    05/17/20 1049 05/18/20 0240  WBC  --  20.2*  HGB 8.1* 7.6*  HCT 25.6* 24.5*  PLT  --  141*   BMET Recent Labs    05/17/20 1049 05/18/20 0240  NA 135 134*  K 4.8 4.9  CL 96* 95*  CO2 28 29  GLUCOSE 208* 200*  BUN 55* 66*  CREATININE 3.89* 5.11*  CALCIUM 8.1* 8.3*   PT/INR No results for input(s): LABPROT, INR in the last 72 hours. ABG No results for input(s): PHART, HCO3 in the last 72 hours.  Invalid input(s): PCO2, PO2  Studies/Results: CT ABDOMEN WO CONTRAST  Result Date: 05/16/2020 CLINICAL DATA:  Abdominal swelling. Ascites suspected. Post nephrectomy with acute decrease in a area. Increased abdominal swelling. EXAM: CT ABDOMEN WITHOUT CONTRAST TECHNIQUE: Multidetector CT imaging of the abdomen was performed following the standard protocol without IV contrast. COMPARISON:  Preoperative CT 02/01/2020 FINDINGS: Lower chest: Small right pleural effusion. Linear atelectasis in the lingula and right lower lobe. Coronary artery calcifications. Small pericardial effusion. Hepatobiliary: Hemorrhage tracks adjacent to the liver in the right upper quadrant. No definite intrahepatic fluid collection or focal lesion. No calcified gallstone or biliary dilatation. Pancreas: No ductal dilatation or inflammation. Spleen: Normal spleen size. Perisplenic fluid measures simple fluid density. Adrenals/Urinary Tract: Post right nephrectomy. Large volume of hemorrhage in the right nephrectomy bed. Ill-defined air fluid collection tracks in the right retroperitoneum anterior to the iliopsoas  muscle and laterally in the abdominal cavity. Hemorrhage tracks into the right aspect of the pelvis in the is only partially included in the field of view. There is moderate subcutaneous emphysema involving  the right abdominal wall. Small foci of air dissects between the right abdominal wall musculature. Simple cyst in the left kidney. There is no left hydronephrosis. Left adrenal gland is normal. Right adrenal gland not well seen. Stomach/Bowel: Small hiatal hernia. Air-fluid level in the stomach which is distended. No evidence of bowel obstruction. Mild gaseous distention of colon which may be reactive. Normal appendix. Vascular/Lymphatic: Aortic atherosclerosis. No aortic aneurysm. No bulky abdominal adenopathy. Other: Free air related to recent right nephrectomy. Large amount of right retroperitoneal hemorrhage which tracks into the pelvis, partially included in the field of view. Heterogeneous air and fluid throughout the right retroperitoneum. Subcutaneous air in the right abdominal wall which tracks into the lower chest. Musculoskeletal: No acute osseous abnormalities. Stable bone island within L2. IMPRESSION: 1. Post right nephrectomy. Large volume of right retroperitoneal hemorrhage with heterogeneous air and fluid which tracks into the pelvis and right upper quadrant. 2. Moderate subcutaneous emphysema involving the right abdominal wall. 3. Small right pleural effusion. Small pericardial effusion. Aortic Atherosclerosis (ICD10-I70.0). These results were called by telephone at the time of interpretation on 05/16/2020 at 6:58 pm to Dr Sherrilyn Rist , who verbally acknowledged these results. Electronically Signed   By: Keith Rake M.D.   On: 05/16/2020 18:58   DG Chest Port 1 View  Result Date: 05/18/2020 CLINICAL DATA:  Shortness of breath EXAM: PORTABLE CHEST 1 VIEW COMPARISON:  October 25, 2018 FINDINGS: There is a small left pleural effusion with mild bibasilar atelectatic change. No edema or airspace opacity. Heart is mildly enlarged with pulmonary vascularity normal. No adenopathy. No bone lesions. IMPRESSION: Small left pleural effusion with bibasilar atelectasis. Lungs otherwise clear. Heart  mildly enlarged. No adenopathy appreciable. Electronically Signed   By: Lowella Grip III M.D.   On: 05/18/2020 07:54    Anti-infectives: Anti-infectives (From admission, onward)   Start     Dose/Rate Route Frequency Ordered Stop   05/16/20 1700  ceFAZolin (ANCEF) IVPB 1 g/50 mL premix        1 g 100 mL/hr over 30 Minutes Intravenous Every 8 hours 05/16/20 1600 05/17/20 0206   05/16/20 0600  ceFAZolin (ANCEF) 3 g in dextrose 5 % 50 mL IVPB        3 g 100 mL/hr over 30 Minutes Intravenous 30 min pre-op 05/15/20 0725 05/16/20 1944      Assessment/Plan:  1 - RIGHT Renal Neoplasm - no furhter cancer directed therapy this admission.   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  Agree with vigorous hydration and PRN transfusion to optimize pre-renal factors. Appreciate CCM and nephrology comanagement.  Fortunately K and voluem status still adequate.   3- Acute Blood Loss Anemia - stabilizing now. Low suspicion for any significant ongoing bleeding. Hgb drift at this point likley more equilbration / dilutional. Agree with PRN transfusion again to optimise pre-renal factors.   4 - Post-Op Ileus - low suspicion for mechanical obstruction. Begin Q8 low dose reglan. SPP today x 1. Consider repeat imaging with PO contrast in few days if persistent.   Certainly needs to remain ICU/stepdown.   Alexis Frock 05/18/2020

## 2020-05-19 ENCOUNTER — Inpatient Hospital Stay (HOSPITAL_COMMUNITY): Payer: Medicare Other

## 2020-05-19 LAB — TYPE AND SCREEN
ABO/RH(D): O POS
Antibody Screen: NEGATIVE
Unit division: 0
Unit division: 0

## 2020-05-19 LAB — BASIC METABOLIC PANEL
Anion gap: 9 (ref 5–15)
BUN: 81 mg/dL — ABNORMAL HIGH (ref 8–23)
CO2: 24 mmol/L (ref 22–32)
Calcium: 8 mg/dL — ABNORMAL LOW (ref 8.9–10.3)
Chloride: 96 mmol/L — ABNORMAL LOW (ref 98–111)
Creatinine, Ser: 7.44 mg/dL — ABNORMAL HIGH (ref 0.61–1.24)
GFR calc Af Amer: 8 mL/min — ABNORMAL LOW (ref >60)
GFR calc non Af Amer: 7 mL/min — ABNORMAL LOW (ref >60)
Glucose, Bld: 180 mg/dL — ABNORMAL HIGH (ref 70–99)
Potassium: 5.4 mmol/L — ABNORMAL HIGH (ref 3.5–5.1)
Sodium: 129 mmol/L — ABNORMAL LOW (ref 135–145)

## 2020-05-19 LAB — CBC WITH DIFFERENTIAL/PLATELET
Abs Immature Granulocytes: 0.09 10*3/uL — ABNORMAL HIGH (ref 0.00–0.07)
Basophils Absolute: 0 10*3/uL (ref 0.0–0.1)
Basophils Relative: 0 %
Eosinophils Absolute: 0 10*3/uL (ref 0.0–0.5)
Eosinophils Relative: 0 %
HCT: 22.9 % — ABNORMAL LOW (ref 39.0–52.0)
Hemoglobin: 7 g/dL — ABNORMAL LOW (ref 13.0–17.0)
Immature Granulocytes: 1 %
Lymphocytes Relative: 3 %
Lymphs Abs: 0.6 10*3/uL — ABNORMAL LOW (ref 0.7–4.0)
MCH: 28.3 pg (ref 26.0–34.0)
MCHC: 30.6 g/dL (ref 30.0–36.0)
MCV: 92.7 fL (ref 80.0–100.0)
Monocytes Absolute: 2.1 10*3/uL — ABNORMAL HIGH (ref 0.1–1.0)
Monocytes Relative: 11 %
Neutro Abs: 15.9 10*3/uL — ABNORMAL HIGH (ref 1.7–7.7)
Neutrophils Relative %: 85 %
Platelets: 124 10*3/uL — ABNORMAL LOW (ref 150–400)
RBC: 2.47 MIL/uL — ABNORMAL LOW (ref 4.22–5.81)
RDW: 16.4 % — ABNORMAL HIGH (ref 11.5–15.5)
WBC: 18.6 10*3/uL — ABNORMAL HIGH (ref 4.0–10.5)
nRBC: 0 % (ref 0.0–0.2)

## 2020-05-19 LAB — BPAM RBC
Blood Product Expiration Date: 202108052359
Blood Product Expiration Date: 202108052359
ISSUE DATE / TIME: 202107091843
ISSUE DATE / TIME: 202107111359
Unit Type and Rh: 5100
Unit Type and Rh: 5100

## 2020-05-19 LAB — GLUCOSE, CAPILLARY
Glucose-Capillary: 162 mg/dL — ABNORMAL HIGH (ref 70–99)
Glucose-Capillary: 158 mg/dL — ABNORMAL HIGH (ref 70–99)
Glucose-Capillary: 136 mg/dL — ABNORMAL HIGH (ref 70–99)
Glucose-Capillary: 112 mg/dL — ABNORMAL HIGH (ref 70–99)

## 2020-05-19 LAB — MAGNESIUM: Magnesium: 2.3 mg/dL (ref 1.7–2.4)

## 2020-05-19 NOTE — Progress Notes (Signed)
NAME:  Todd Robertson, MRN:  [id number], DOB:  [birth year], LOS: 3 ADMISSION DATE:  05/16/2020, CONSULTATION DATE:  05/16/2020 REFERRING MD:  Dr Alyson Ingles, CHIEF COMPLAINT:  Shock   Brief History    74 year old male w/ hx as outlined below. Presented 7/9 for planned right Nephrectomy. Went to the OR that day. EBL estimated at 334ml. Intraoperative anesthesia note unremarkable. While in the PACU had declining w/ systolic BPs in 82U. Placed in Trendelenburg; IVF boluses administered (3 liters crystalloid and 250 albumin). BP normalized but only made 16ml of urine.  Because of this PCCM was asked to assess for circulatory shock and concern about hemodynamics  Past Medical History   Past Medical History:  Diagnosis Date  . Arthritis   . Cancer [facility])    prostate  . Cardiomyopathy    secondary  . CKD (chronic kidney disease)   . DM2 (diabetes mellitus, type 2) ([place])   . Gout   . HTN (hypertension)    unspec  . Hypercholesterolemia   . Nonischemic cardiomyopathy ([place])    a. EF 40-50% by most recent 2-D echo b. EF 25% by cardiac cath in 2004  . Overweight(278.02)     Significant Hospital Events   05/16/2020-right nephrectomy  PCCM asked to see post-op for post-operative hypotension. Baseline hgb: 12.3; post-op hgb 10.7 (in PACU). EBL intra-op 36ml.   Consults:  pccm  Procedures:  Robotic-right nephrectomy for papillary RCC   Significant Diagnostic Tests:  CT abdomen 7/9 IMPRESSION: 1. Post right nephrectomy. Large volume of right retroperitoneal hemorrhage with heterogeneous air and fluid which tracks into the pelvis and right upper quadrant. 2. Moderate subcutaneous emphysema involving the right abdominal wall. 3. Small right pleural effusion. Small pericardial effusion.  Micro Data:    Antimicrobials:  7/9-cefazolin  Interim history/subjective:   Awake. Watching tv. Eating breakfast   Objective   Blood pressure 108/63, pulse 90, temperature 98.5 F (36.9 C),  temperature source Axillary, resp. rate (!) 26, height 6\' 2"  (1.88 m), weight 132 kg, SpO2 100 %.        Intake/Output Summary (Last 24 hours) at 05/19/2020 0739 Last data filed at 05/19/2020 0600 Gross per 24 hour  Intake 1400.83 ml  Output 208 ml  Net 1192.83 ml   Filed Weights   05/16/20 0532 05/16/20 1635 05/16/20 1900  Weight: 126.6 kg 132 kg 132 kg    Examination: General: obese male, resting comfortably in bed HENT: MMM Lungs: CTAB, diminsihed in based Cardiovascular: RRR, s1 s2  Abdomen: soft, NT ND  Extremities: no edema  Neuro: AAOX3  GU: deferred   Resolved Hospital Problem list     Assessment & Plan:   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 -discussed with nephrology  -continue IVFs  -no emergent need for HD at this time - obs in ICU  - may need temp HD catheter today or tomorrow - will await recs from nephrology   Postop hypotension, s/p nephrectomy for renal cell cancer - resolved  - continue to observe   Type 2 diabetes -CBGS with SSI   Heart failure with preserved ejection fraction - likely need volume removal with HD - getting fluids per nephro at this time - high risk for worsening pulmonary edema and my need BIPAP  Renal cell cancer -s/p nephrectomy   Past history of smoking - scheduled bronchodilators    Best practice:  Diet: Renal diet Pain/Anxiety/Delirium protocol (if indicated): Roxicodone VAP protocol (if indicated): Not indicated DVT  prophylaxis: SCD GI prophylaxis: pepcid Glucose control: monitor Mobility: bedrest Code Status: full Family Communication: Discussed with patient, spouse and daughter at bedside Disposition: ICU  Labs   CBC: Recent Labs  Lab 05/13/20 0845 05/16/20 1131 05/16/20 1538 05/17/20 0215 05/17/20 1049 05/18/20 0240 05/18/20 2002  WBC 7.7  --   --   --   --  20.2*  --   NEUTROABS  --   --   --   --   --  16.9*  --   HGB 12.3*   < > 8.0* 8.1* 8.1* 7.6* 7.5*  HCT 39.6   < >  25.2* 25.8* 25.6* 24.5* 24.3*  MCV 96.4  --   --   --   --  91.8  --   PLT 182  --   --   --   --  141*  --    < > = values in this interval not displayed.    Basic Metabolic Panel: Recent Labs  Lab 05/16/20 1538 05/17/20 0215 05/17/20 1049 05/18/20 0240 05/18/20 2002  NA 137 133* 135 134* 132*  K 5.5* 4.8 4.8 4.9 5.7*  CL 102 98 96* 95* 98  CO2 25 27 28 29 25   GLUCOSE 214* 210* 208* 200* 160*  BUN 44* 51* 55* 66* 74*  CREATININE 2.51* 3.42* 3.89* 5.11* 6.66*  CALCIUM 8.4* 8.1* 8.1* 8.3* 8.0*   GFR: Estimated Creatinine Clearance: 14.5 mL/min (A) (by C-G formula based on SCr of 6.66 mg/dL (H)). Recent Labs  Lab 05/13/20 0845 05/18/20 0240  WBC 7.7 20.2*    Liver Function Tests: Recent Labs  Lab 05/18/20 2002  AST 25  ALT 8  ALKPHOS 42  BILITOT 0.7  PROT 5.4*  ALBUMIN 3.1*   No results for input(s): LIPASE, AMYLASE in the last 168 hours. No results for input(s): AMMONIA in the last 168 hours.  ABG No results found for: PHART, PCO2ART, PO2ART, HCO3, TCO2, ACIDBASEDEF, O2SAT   Coagulation Profile: No results for input(s): INR, PROTIME in the last 168 hours.  Cardiac Enzymes: No results for input(s): CKTOTAL, CKMB, CKMBINDEX, TROPONINI in the last 168 hours.  HbA1C: Hgb A1c MFr Bld  Date/Time Value Ref Range Status  05/13/2020 08:45 AM 6.2 (H) 4.8 - 5.6 % Final    Comment:    (NOTE) Pre diabetes:          5.7%-6.4%  Diabetes:              >6.4%  Glycemic control for   <7.0% adults with diabetes   11/11/2017 03:03 PM 5.8 (H) 4.8 - 5.6 % Final    Comment:    (NOTE) Pre diabetes:          5.7%-6.4% Diabetes:              >6.4% Glycemic control for   <7.0% adults with diabetes     CBG: Recent Labs  Lab 05/13/20 0756 05/16/20 0532 05/16/20 1139 05/18/20 1632 05/18/20 2202  GLUCAP 121* 127* 151* 156* 134*    Review of Systems:   Awake and alert nausea  Past Medical History  He,  has a past medical history of Arthritis, Cancer ([place]),  Cardiomyopathy, CKD (chronic kidney disease), DM2 (diabetes mellitus, type 2) ([place]), Gout, HTN (hypertension), Hypercholesterolemia, Nonischemic cardiomyopathy ([place]), and Overweight(278.02).   Surgical History    Past Surgical History:  Procedure Laterality Date  . COLONOSCOPY    . HERNIA REPAIR    . KNEE ARTHROPLASTY Left 08/08/2017   Procedure:  LEFT TOTAL KNEE ARTHROPLASTY WITH COMPUTER NAVIGATION;  Surgeon: Rod Can, MD;  Location: [place];  Service: Orthopedics;  Laterality: Left;  Needs RNFA  . KNEE ARTHROPLASTY Right 11/17/2017   Procedure: RIGHT TOTAL KNEE ARTHROPLASTY WITH COMPUTER NAVIGATION;  Surgeon: Rod Can, MD;  Location: [facility];  Service: Orthopedics;  Laterality: Right;  NEEDS RNFA  . ROBOT ASSISTED LAPAROSCOPIC NEPHRECTOMY Right 05/16/2020   Procedure: XI ROBOTIC ASSISTED LAPAROSCOPIC NEPHRECTOMY;  Surgeon: Cleon Gustin, MD;  Location: [facility];  Service: Urology;  Laterality: Right;  2.5 hrs     Social History   reports that he quit smoking about 35 years ago. His smoking use included cigarettes. He started smoking about 54 years ago. He has a 10.00 pack-year smoking history. He has never used smokeless tobacco. He reports that he does not drink alcohol and does not use drugs.   Family History   His family history includes Heart disease in his father; Hypertension in his mother; Kidney disease in his mother and another family member.   Allergies No Known Allergies    This patient is critically ill with multiple organ system failure; which, requires frequent high complexity decision making, assessment, support, evaluation, and titration of therapies. This was completed through the application of advanced monitoring technologies and extensive interpretation of multiple databases. During this encounter critical care time was devoted to patient care services described in this note for 31 minutes.  Garner Nash, DO [facility] 05/19/2020  7:39 AM

## 2020-05-19 NOTE — Progress Notes (Signed)
Todd Robertson  Subjective: seen in room, no SOB or cough, no confusion or jerking  Vitals:   05/19/20 0600 05/19/20 0744 05/19/20 1000 05/19/20 1143  BP: 108/63  (!) 95/48   Pulse: 90  77   Resp: (!) 26  (!) 27   Temp:  98.5 F (36.9 C)  98.5 F (36.9 C)  TempSrc:  Oral  Oral  SpO2: 100%  100%   Weight:      Height:        Exam: Gen large-framed AAM no distress, alert and O x3 No jvd or bruits Chest clear bilat to bases no rales or wheezing RRR no MRG Abd marked abd obesity, lap wounds in R abdomen, distended, nontender GU normal male w/ foley draining clear yellow urine MS no joint effusions or deformity Ext 1+ bilat UE edema, no sig LE edema Neuro is Ox 3 , nf, no asterixis    Home meds:  - asa 41m/ lipitor 40 hs/ benazepril 20 am/ bumex 168mqd/ coreg 25 bid  - allopurinol 300 hs  - symbicort ACT 2 puff bid  - prn's/ vitamins/ supplements  Date                             Creat               egFR   2018                          1.79- 2.38        30- 43   2019                          1.80- 1.81        42-43   January 12, 2020           3.00                    May 13, 2020                2.60                 27   Jul 9                           2.51                 29- 33   Jul 10                         3.42, 3.89    UA none yet   CT abd wo contrast 7/9 - Adrenals/Urinary Tract: Post right nephrectomy. Large volume of hemorrhage in the right nephrectomy bed. Ill-defined air fluid collection tracks in the right retroperitoneum anterior to the iliopsoas muscle and laterally in the abdominal cavity. Hemorrhage tracks into the right aspect of the pelvis in the is only partially included in the field of view. There is moderate subcutaneous emphysema involving the right abdominal wall. Small foci of air dissects between the right abdominal wall musculature. Simple cyst in the left kidney. There is no left hydronephrosis.    BP 143/ 63  RH 85   RR 18- 23  Temp 98   Recorded UOP yest 200 cc, today 250 cc  Na 135 K 4.8 BUN 55 Cr 3.89  (preop Cr 2.23)  CO2 28  CA 8.1  Hb 8.1    Assessment/ Plan: 1. AoCKD IV - b/l creat 2.60, eGFR 27. AKI expected to some degree after R nephrectomy on 7/9, but UOP never picked up despite sig volume loading (+9 L net). Remains lucid, creat 7.7, no need for HD today, however likely will need HD next 1-2 days , may be permanent HD. Have d/w pt and family and CCM.  2. Renal mass - sp R nephrectomy 05/16/20 3. Anemia ckd / abl - Hb down 12 > 10 > 8.1> 7.6> 7.0. Transfuse as needed.  4. HTN -  coreg dc'd w/ some soft BP's 5. Somnolence - better, changed to IV dilaudid, wean off IV pain meds asap        Todd Robertson 05/19/2020, 12:06 PM   Recent Labs  Lab 05/18/20 2002 05/19/20 0722  K 5.7* 5.4*  BUN 74* 81*  CREATININE 6.66* 7.44*  CALCIUM 8.0* 8.0*  HGB 7.5* 7.0*   Inpatient medications:  Chlorhexidine Gluconate Cloth  6 each Topical Daily   docusate sodium  100 mg Oral BID   famotidine  10 mg Oral Daily   insulin aspart  0-9 Units Subcutaneous TID WC   mouth rinse  15 mL Mouth Rinse BID   mometasone-formoterol  2 puff Inhalation BID    albuterol, alum & mag hydroxide-simeth, diphenhydrAMINE **OR** diphenhydrAMINE, HYDROmorphone (DILAUDID) injection, menthol-cetylpyridinium, ondansetron, oxyCODONE, promethazine, tiZANidine

## 2020-05-19 NOTE — Progress Notes (Signed)
3 Days Post-Op   Subjective/Chief Complaint:  1 - RIGHT Renal Neoplasm - s/p right robotic radical nephrectomy 05/16/20 for large Rt mass. Path pending.  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 S/p right nephrectomy 7/9. Initially near anuric POD 0. CT 7/9 PM unremarkable left (solitary) kidney. Continued poor urine output, nephrology consulted, considering dialysis in the next few days. Not today.   3- Acute Blood Loss Anemia - Hgb 8s pos-op (after 3L fluid). No tachycardia. CT with expected post-op changes, no large hematomas. Transfusion x 1u pRBC 7/9, again on 7/11. Hb 7 today.  4 - Post-Op Ileus - pt initially tollerating PO intake post-op. Some new emesis POD 2 and abd exam with new gasseous distension. Suppository given 7/11. Now with multiple bowel movements and passing flatus 7/12. Distended but feels as though at baseline.   Patient reports that he is feeling much better today. Having bowel movements and passing flatus. UOP still low, 200cc for past 24 hours.    Objective: Vital signs in last 24 hours: Temp:  [98 F (36.7 C)-98.5 F (36.9 C)] 98.5 F (36.9 C) (07/12 1143) Pulse Rate:  [71-95] 71 (07/12 1500) Resp:  [19-28] 19 (07/12 1500) BP: (89-149)/(38-94) 104/58 (07/12 1400) SpO2:  [70 %-100 %] 100 % (07/12 1500) Last BM Date: 05/18/20  Intake/Output from previous day: 07/11 0701 - 07/12 0700 In: 1449.1 [I.V.:1119.1; Blood:330] Out: 208 [NOMVE:720] Intake/Output this shift: Total I/O In: 833.4 [P.O.:700; I.V.:133.4] Out: 30 [Urine:30]   General appearance: alert, cooperative and very pleasant.  Eyes: negative Nose: Nares normal. Septum midline. Mucosa normal. No drainage or sinus tenderness. Throat: lips, mucosa, and tongue normal; teeth and gums normal Neck: supple, symmetrical, trachea midline Back: symmetric, no curvature. ROM normal. No CVA tenderness. Resp: non-labored on [place] O2 Cardio: HR 70s on monitor, regular.   Male genitalia: normal, foley in place wtih concenrated appearing urine that is non-foul.  Extremities: extremities normal, atraumatic, no cyanosis or edema Skin: Skin color, texture, turgor normal. No rashes or lesions  Abd obese, no r/g. Rt sided port and extraction sites c/d/i. Large right flank ecchymosis. Distended but soft, nontender abdomen.   Lab Results:  Recent Labs    05/18/20 0240 05/18/20 0240 05/18/20 2002 05/19/20 0722  WBC 20.2*  --   --  18.6*  HGB 7.6*   < > 7.5* 7.0*  HCT 24.5*   < > 24.3* 22.9*  PLT 141*  --   --  124*   < > = values in this interval not displayed.   BMET Recent Labs    05/18/20 2002 05/19/20 0722  NA 132* 129*  K 5.7* 5.4*  CL 98 96*  CO2 25 24  GLUCOSE 160* 180*  BUN 74* 81*  CREATININE 6.66* 7.44*  CALCIUM 8.0* 8.0*   PT/INR No results for input(s): LABPROT, INR in the last 72 hours. ABG No results for input(s): PHART, HCO3 in the last 72 hours.  Invalid input(s): PCO2, PO2  Studies/Results: DG CHEST PORT 1 VIEW  Result Date: 05/19/2020 CLINICAL DATA:  Shortness of breath.  Acute kidney injury. EXAM: PORTABLE CHEST 1 VIEW COMPARISON:  Single-view of the chest 05/18/2020. CT abdomen and pelvis 05/16/2020. FINDINGS: Small bilateral pleural effusions and basilar atelectasis are greater on the right. Pleural effusion on the right is slightly increased. Heart size is normal. No pneumothorax. No acute bony abnormality. IMPRESSION: Small bilateral pleural effusions and basilar atelectasis. Effusion on the right has slightly  increased since the prior chest film. Electronically Signed   By: Inge Rise M.D.   On: 05/19/2020 13:07   DG Chest Port 1 View  Result Date: 05/18/2020 CLINICAL DATA:  Shortness of breath EXAM: PORTABLE CHEST 1 VIEW COMPARISON:  October 25, 2018 FINDINGS: There is a small left pleural effusion with mild bibasilar atelectatic change. No edema or airspace opacity. Heart is mildly enlarged with pulmonary  vascularity normal. No adenopathy. No bone lesions. IMPRESSION: Small left pleural effusion with bibasilar atelectasis. Lungs otherwise clear. Heart mildly enlarged. No adenopathy appreciable. Electronically Signed   By: Lowella Grip III M.D.   On: 05/18/2020 07:54    Anti-infectives: Anti-infectives (From admission, onward)   Start     Dose/Rate Route Frequency Ordered Stop   05/16/20 1700  ceFAZolin (ANCEF) IVPB 1 g/50 mL premix        1 g 100 mL/hr over 30 Minutes Intravenous Every 8 hours 05/16/20 1600 05/17/20 0206   05/16/20 0600  ceFAZolin (ANCEF) 3 g in dextrose 5 % 50 mL IVPB        3 g 100 mL/hr over 30 Minutes Intravenous 30 min pre-op 05/15/20 0725 05/16/20 1944      Assessment/Plan:  1 - RIGHT Renal Neoplasm - no furhter cancer directed therapy this admission.   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 Solitary kidney w/o hydro or significant atrophy on imaging this admission.  Appreciate nephrology and CCM comanagement in determining timing/plan to initiate HD.    3- Acute Blood Loss Anemia - relatively stable, low suspicion for any significant ongoing bleeding. Hgb drift at this point likley more equilbration / dilutional. Agree with PRN transfusion again to optimise pre-renal factors.   4 - Post-Op Ileus - resolved, patient now having bowel movements   Carmie Kanner 05/19/2020

## 2020-05-19 NOTE — TOC Initial Note (Signed)
Transition of Care [facility]) - Initial/Assessment Note    Patient Details  Name: Todd Robertson MRN: [id number] Date of Birth: 1945/11/29  Transition of Care [facility]) CM/SW Contact:    Ninfa Meeker, RN Phone Number: [phone] (working remotely) 05/19/2020, 8:40 AM  Clinical Narrative:  Patient is a 74 yr old male,from home, admitted for scheduled Right Nephrectomy 05/16/20 for Ca of the kidney. Patient experienced hypotension and AKI with decreased urine output, transferred to ICU for close monitoring. Patient may need Temporary HD cath. TOC team will continue to follow for needs.                  Patient Goals and CMS Choice        Expected Discharge Plan and Services                                                Prior Living Arrangements/Services                       Activities of Daily Living Home Assistive Devices/Equipment: Eyeglasses ADL Screening (condition at time of admission) Patient's cognitive ability adequate to safely complete daily activities?: Yes Is the patient deaf or have difficulty hearing?: No Does the patient have difficulty seeing, even when wearing glasses/contacts?: No Does the patient have difficulty concentrating, remembering, or making decisions?: No Patient able to express need for assistance with ADLs?: Yes Does the patient have difficulty dressing or bathing?: No Independently performs ADLs?: Yes (appropriate for developmental age) Does the patient have difficulty walking or climbing stairs?: No Weakness of Legs: None Weakness of Arms/Hands: None  Permission Sought/Granted                  Emotional Assessment              Admission diagnosis:  Cancer of kidney ([place]) [C64.9] S/p nephrectomy [Z90.5] Patient Active Problem List   Diagnosis Date Noted  . Cancer of kidney ([place]) 05/16/2020  . S/p nephrectomy 05/16/2020  . 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 Renal mass 02/13/2020   . Osteoarthritis of right knee 11/17/2017  . CKD (chronic kidney disease) stage 3, GFR 30-59 ml/min (HCC) 08/08/2017  . Degenerative arthritis of left knee 08/08/2017  . NSVT (nonsustained ventricular tachycardia) ([place])   . DM 12/26/2009  . OVERWEIGHT/OBESITY 08/08/2009  . HTN (hypertension) 08/08/2009  . CARDIOMYOPATHY, SECONDARY 08/08/2009   PCP:  Monico Blitz, MD Pharmacy:   [facility] [address] [place] [address] Phone: [phone] Fax: [phone]     Social Determinants of Health (SDOH) Interventions    Readmission Risk Interventions No flowsheet data found.

## 2020-05-19 NOTE — Progress Notes (Signed)
eLink Physician-Brief Progress Note Patient Name: Todd Robertson DOB: November 10, 1945 MRN: [id number]   Date of Service  05/19/2020  HPI/Events of Note  Patient needs a.m. labs  eICU Interventions   a.m. labs ordered.        Frederik Pear 05/19/2020, 6:49 AM

## 2020-05-20 ENCOUNTER — Inpatient Hospital Stay (HOSPITAL_COMMUNITY): Payer: Medicare Other

## 2020-05-20 DIAGNOSIS — J9600 Acute respiratory failure, unspecified whether with hypoxia or hypercapnia: Secondary | ICD-10-CM

## 2020-05-20 HISTORY — PX: IR US GUIDE VASC ACCESS RIGHT: IMG2390

## 2020-05-20 HISTORY — PX: IR FLUORO GUIDE CV LINE RIGHT: IMG2283

## 2020-05-20 LAB — BASIC METABOLIC PANEL
Anion gap: 11 (ref 5–15)
Anion gap: 12 (ref 5–15)
BUN: 96 mg/dL — ABNORMAL HIGH (ref 8–23)
BUN: 103 mg/dL — ABNORMAL HIGH (ref 8–23)
CO2: 22 mmol/L (ref 22–32)
CO2: 24 mmol/L (ref 22–32)
Calcium: 8.2 mg/dL — ABNORMAL LOW (ref 8.9–10.3)
Calcium: 8.3 mg/dL — ABNORMAL LOW (ref 8.9–10.3)
Chloride: 96 mmol/L — ABNORMAL LOW (ref 98–111)
Chloride: 94 mmol/L — ABNORMAL LOW (ref 98–111)
Creatinine, Ser: 8.84 mg/dL — ABNORMAL HIGH (ref 0.61–1.24)
Creatinine, Ser: 9.41 mg/dL — ABNORMAL HIGH (ref 0.61–1.24)
GFR calc Af Amer: 6 mL/min — ABNORMAL LOW (ref >60)
GFR calc Af Amer: 6 mL/min — ABNORMAL LOW (ref >60)
GFR calc non Af Amer: 5 mL/min — ABNORMAL LOW (ref >60)
GFR calc non Af Amer: 5 mL/min — ABNORMAL LOW (ref >60)
Glucose, Bld: 115 mg/dL — ABNORMAL HIGH (ref 70–99)
Glucose, Bld: 197 mg/dL — ABNORMAL HIGH (ref 70–99)
Potassium: 6.1 mmol/L — ABNORMAL HIGH (ref 3.5–5.1)
Potassium: 6 mmol/L — ABNORMAL HIGH (ref 3.5–5.1)
Sodium: 129 mmol/L — ABNORMAL LOW (ref 135–145)
Sodium: 130 mmol/L — ABNORMAL LOW (ref 135–145)

## 2020-05-20 LAB — POCT I-STAT 7, (LYTES, BLD GAS, ICA,H+H)
Acid-base deficit: 4 mmol/L — ABNORMAL HIGH (ref 0.0–2.0)
Acid-base deficit: 2 mmol/L (ref 0.0–2.0)
Acid-base deficit: 1 mmol/L (ref 0.0–2.0)
Acid-base deficit: 3 mmol/L — ABNORMAL HIGH (ref 0.0–2.0)
Bicarbonate: 24.4 mmol/L (ref 20.0–28.0)
Bicarbonate: 26.1 mmol/L (ref 20.0–28.0)
Bicarbonate: 23.1 mmol/L (ref 20.0–28.0)
Bicarbonate: 25.3 mmol/L (ref 20.0–28.0)
Calcium, Ion: 1.15 mmol/L (ref 1.15–1.40)
Calcium, Ion: 1.16 mmol/L (ref 1.15–1.40)
Calcium, Ion: 1.09 mmol/L — ABNORMAL LOW (ref 1.15–1.40)
Calcium, Ion: 1.1 mmol/L — ABNORMAL LOW (ref 1.15–1.40)
HCT: 22 % — ABNORMAL LOW (ref 39.0–52.0)
HCT: 21 % — ABNORMAL LOW (ref 39.0–52.0)
HCT: 20 % — ABNORMAL LOW (ref 39.0–52.0)
HCT: 20 % — ABNORMAL LOW (ref 39.0–52.0)
Hemoglobin: 7.5 g/dL — ABNORMAL LOW (ref 13.0–17.0)
Hemoglobin: 7.1 g/dL — ABNORMAL LOW (ref 13.0–17.0)
Hemoglobin: 6.8 g/dL — CL (ref 13.0–17.0)
Hemoglobin: 6.8 g/dL — CL (ref 13.0–17.0)
O2 Saturation: 92 %
O2 Saturation: 97 %
O2 Saturation: 100 %
O2 Saturation: 79 %
Patient temperature: 99.5
Patient temperature: 99.4
Patient temperature: 99.4
Potassium: 5.9 mmol/L — ABNORMAL HIGH (ref 3.5–5.1)
Potassium: 6.2 mmol/L — ABNORMAL HIGH (ref 3.5–5.1)
Potassium: 5.8 mmol/L — ABNORMAL HIGH (ref 3.5–5.1)
Potassium: 6 mmol/L — ABNORMAL HIGH (ref 3.5–5.1)
Sodium: 128 mmol/L — ABNORMAL LOW (ref 135–145)
Sodium: 127 mmol/L — ABNORMAL LOW (ref 135–145)
Sodium: 127 mmol/L — ABNORMAL LOW (ref 135–145)
Sodium: 128 mmol/L — ABNORMAL LOW (ref 135–145)
TCO2: 26 mmol/L (ref 22–32)
TCO2: 28 mmol/L (ref 22–32)
TCO2: 25 mmol/L (ref 22–32)
TCO2: 27 mmol/L (ref 22–32)
pCO2 arterial: 61.6 mmHg — ABNORMAL HIGH (ref 32.0–48.0)
pCO2 arterial: 68 mmHg (ref 32.0–48.0)
pCO2 arterial: 49.2 mmHg — ABNORMAL HIGH (ref 32.0–48.0)
pCO2 arterial: 50.4 mmHg — ABNORMAL HIGH (ref 32.0–48.0)
pH, Arterial: 7.205 — ABNORMAL LOW (ref 7.350–7.450)
pH, Arterial: 7.196 — CL (ref 7.350–7.450)
pH, Arterial: 7.282 — ABNORMAL LOW (ref 7.350–7.450)
pH, Arterial: 7.311 — ABNORMAL LOW (ref 7.350–7.450)
pO2, Arterial: 81 mmHg — ABNORMAL LOW (ref 83.0–108.0)
pO2, Arterial: 116 mmHg — ABNORMAL HIGH (ref 83.0–108.0)
pO2, Arterial: 428 mmHg — ABNORMAL HIGH (ref 83.0–108.0)
pO2, Arterial: 51 mmHg — ABNORMAL LOW (ref 83.0–108.0)

## 2020-05-20 LAB — CBC WITH DIFFERENTIAL/PLATELET
Abs Immature Granulocytes: 0.09 10*3/uL — ABNORMAL HIGH (ref 0.00–0.07)
Basophils Absolute: 0 10*3/uL (ref 0.0–0.1)
Basophils Relative: 0 %
Eosinophils Absolute: 0 10*3/uL (ref 0.0–0.5)
Eosinophils Relative: 0 %
HCT: 22.5 % — ABNORMAL LOW (ref 39.0–52.0)
Hemoglobin: 7 g/dL — ABNORMAL LOW (ref 13.0–17.0)
Immature Granulocytes: 1 %
Lymphocytes Relative: 4 %
Lymphs Abs: 0.6 10*3/uL — ABNORMAL LOW (ref 0.7–4.0)
MCH: 29.4 pg (ref 26.0–34.0)
MCHC: 31.1 g/dL (ref 30.0–36.0)
MCV: 94.5 fL (ref 80.0–100.0)
Monocytes Absolute: 1.9 10*3/uL — ABNORMAL HIGH (ref 0.1–1.0)
Monocytes Relative: 13 %
Neutro Abs: 12.4 10*3/uL — ABNORMAL HIGH (ref 1.7–7.7)
Neutrophils Relative %: 82 %
Platelets: 145 10*3/uL — ABNORMAL LOW (ref 150–400)
RBC: 2.38 MIL/uL — ABNORMAL LOW (ref 4.22–5.81)
RDW: 16.3 % — ABNORMAL HIGH (ref 11.5–15.5)
WBC: 15 10*3/uL — ABNORMAL HIGH (ref 4.0–10.5)
nRBC: 0 % (ref 0.0–0.2)

## 2020-05-20 LAB — GLUCOSE, CAPILLARY
Glucose-Capillary: 125 mg/dL — ABNORMAL HIGH (ref 70–99)
Glucose-Capillary: 188 mg/dL — ABNORMAL HIGH (ref 70–99)
Glucose-Capillary: 288 mg/dL — ABNORMAL HIGH (ref 70–99)
Glucose-Capillary: 102 mg/dL — ABNORMAL HIGH (ref 70–99)

## 2020-05-20 LAB — TROPONIN I (HIGH SENSITIVITY)
Troponin I (High Sensitivity): 66 ng/L — ABNORMAL HIGH (ref <18)
Troponin I (High Sensitivity): 53 ng/L — ABNORMAL HIGH (ref <18)

## 2020-05-20 LAB — IRON AND TIBC
Iron: 21 ug/dL — ABNORMAL LOW (ref 45–182)
Saturation Ratios: 10 % — ABNORMAL LOW (ref 17.9–39.5)
TIBC: 214 ug/dL — ABNORMAL LOW (ref 250–450)
UIBC: 193 ug/dL

## 2020-05-20 LAB — FERRITIN: Ferritin: 275 ng/mL (ref 24–336)

## 2020-05-20 LAB — MAGNESIUM: Magnesium: 2.7 mg/dL — ABNORMAL HIGH (ref 1.7–2.4)

## 2020-05-20 MED ORDER — DEXTROSE 50 % IV SOLN
50.0000 mL | Freq: Once | INTRAVENOUS | Status: DC
  Filled 2020-05-20: qty 50

## 2020-05-20 MED ORDER — PHENYLEPHRINE HCL-NACL 10-0.9 MG/250ML-% IV SOLN
INTRAVENOUS | Status: AC
  Administered 2020-05-20: 15 mg
  Filled 2020-05-20: qty 250

## 2020-05-20 MED ORDER — PRISMASOL BGK 0/2.5 32-2.5 MEQ/L IV SOLN
INTRAVENOUS | Status: DC
  Filled 2020-05-20 (×5): qty 5000

## 2020-05-20 MED ORDER — PHENYLEPHRINE HCL-NACL 10-0.9 MG/250ML-% IV SOLN: 0.0000 ug/min | INTRAVENOUS | Status: DC

## 2020-05-20 MED ORDER — LIDOCAINE HCL (PF) 1 % IJ SOLN
INTRAMUSCULAR | Status: AC | PRN
  Administered 2020-05-20: 10 mL

## 2020-05-20 MED ORDER — SODIUM ZIRCONIUM CYCLOSILICATE 5 G PO PACK
10.0000 g | PACK | Freq: Once | ORAL | Status: AC
  Administered 2020-05-20: 10 g via ORAL
  Filled 2020-05-20: qty 2

## 2020-05-20 MED ORDER — CHLORHEXIDINE GLUCONATE CLOTH 2 % EX PADS: 6.0000 | MEDICATED_PAD | Freq: Every day | CUTANEOUS | Status: DC

## 2020-05-20 MED ORDER — PHENYLEPHRINE 40 MCG/ML (10ML) SYRINGE FOR IV PUSH (FOR BLOOD PRESSURE SUPPORT)
PREFILLED_SYRINGE | INTRAVENOUS | Status: AC
  Filled 2020-05-20: qty 10

## 2020-05-20 MED ORDER — HEPARIN SODIUM (PORCINE) 1000 UNIT/ML IJ SOLN
INTRAMUSCULAR | Status: AC
  Administered 2020-05-20: 2.6 mL
  Filled 2020-05-20: qty 1

## 2020-05-20 MED ORDER — FENTANYL 2500MCG IN NS 250ML (10MCG/ML) PREMIX INFUSION
25.0000 ug/h | INTRAVENOUS | Status: DC
  Administered 2020-05-20: 50 ug/h via INTRAVENOUS
  Administered 2020-05-21: 250 ug/h via INTRAVENOUS
  Administered 2020-05-22: 75 ug/h via INTRAVENOUS
  Administered 2020-05-23 – 2020-05-24 (×2): 125 ug/h via INTRAVENOUS
  Filled 2020-05-20 (×5): qty 250

## 2020-05-20 MED ORDER — ASPIRIN 81 MG PO CHEW
81.0000 mg | CHEWABLE_TABLET | Freq: Every day | ORAL | Status: DC
  Administered 2020-05-20: 81 mg via ORAL
  Filled 2020-05-20 (×3): qty 1

## 2020-05-20 MED ORDER — PANTOPRAZOLE SODIUM 40 MG PO PACK: 40.0000 mg | PACK | Freq: Every day | ORAL | Status: DC

## 2020-05-20 MED ORDER — INSULIN ASPART 100 UNIT/ML ~~LOC~~ SOLN
5.0000 [IU] | Freq: Once | SUBCUTANEOUS | Status: AC
  Administered 2020-05-20: 5 [IU] via INTRAVENOUS

## 2020-05-20 MED ORDER — DEXTROSE 50 % IV SOLN
1.0000 | Freq: Once | INTRAVENOUS | Status: AC
  Administered 2020-05-20: 50 mL via INTRAVENOUS
  Filled 2020-05-20: qty 50

## 2020-05-20 MED ORDER — PRISMASOL BGK 0/2.5 32-2.5 MEQ/L IV SOLN
INTRAVENOUS | Status: DC
  Filled 2020-05-20 (×25): qty 5000

## 2020-05-20 MED ORDER — ETOMIDATE 2 MG/ML IV SOLN
INTRAVENOUS | Status: AC
  Administered 2020-05-20: 20 mg
  Filled 2020-05-20: qty 10

## 2020-05-20 MED ORDER — FENTANYL CITRATE (PF) 100 MCG/2ML IJ SOLN
INTRAMUSCULAR | Status: AC
  Administered 2020-05-20: 100 ug
  Filled 2020-05-20: qty 2

## 2020-05-20 MED ORDER — SODIUM CHLORIDE 0.9 % FOR CRRT: INTRAVENOUS_CENTRAL | Status: DC | PRN

## 2020-05-20 MED ORDER — ROCURONIUM BROMIDE 10 MG/ML (PF) SYRINGE
PREFILLED_SYRINGE | INTRAVENOUS | Status: AC
  Filled 2020-05-20: qty 10

## 2020-05-20 MED ORDER — HEPARIN SODIUM (PORCINE) 1000 UNIT/ML DIALYSIS
1000.0000 [IU] | INTRAMUSCULAR | Status: DC | PRN
  Administered 2020-05-21 (×2): 2600 [IU] via INTRAVENOUS_CENTRAL
  Administered 2020-05-22 – 2020-05-25 (×2): 2800 [IU] via INTRAVENOUS_CENTRAL
  Filled 2020-05-20: qty 3
  Filled 2020-05-20 (×4): qty 6
  Filled 2020-05-20 (×2): qty 3
  Filled 2020-05-20 (×3): qty 6

## 2020-05-20 MED ORDER — PRISMASOL BGK 0/2.5 32-2.5 MEQ/L IV SOLN
INTRAVENOUS | Status: DC
  Filled 2020-05-20 (×8): qty 5000

## 2020-05-20 MED ORDER — FENTANYL CITRATE (PF) 100 MCG/2ML IJ SOLN
INTRAMUSCULAR | Status: AC
  Filled 2020-05-20: qty 2

## 2020-05-20 MED ORDER — ETOMIDATE 2 MG/ML IV SOLN
INTRAVENOUS | Status: AC
  Filled 2020-05-20: qty 10

## 2020-05-20 MED ORDER — LIDOCAINE HCL 1 % IJ SOLN
INTRAMUSCULAR | Status: AC
  Filled 2020-05-20: qty 20

## 2020-05-20 MED ORDER — MIDAZOLAM HCL 2 MG/2ML IJ SOLN
INTRAMUSCULAR | Status: AC
  Administered 2020-05-20: 2 mg
  Filled 2020-05-20: qty 2

## 2020-05-20 MED ORDER — INSULIN ASPART 100 UNIT/ML IV SOLN
5.0000 [IU] | Freq: Once | INTRAVENOUS | Status: AC
  Administered 2020-05-20: 5 [IU] via INTRAVENOUS

## 2020-05-20 NOTE — Progress Notes (Signed)
PCCM Progress Note   Called by Elink concerning decreased LOC. On arrival patient is seen somulent on BIPAP, will arouse to verbal stimuli but quickly fall back to sleep. Repeat ABG obtained STAT and revealed worsening hypoxic and hypercapnic respiratory failure, prompting decision to intubate. Family at bedside and made aware of plan and agreed to proceed to intubation. Please see separate procedure note for further details.  Johnsie Cancel, NP-C [facility] Contact / Pager information can be found on Amion  05/20/2020, 8:37 PM

## 2020-05-20 NOTE — Progress Notes (Signed)
ABG results given to Dr. Valeta Harms. Verbal order received to place pt on Bipap. RT will continue to monitor.

## 2020-05-20 NOTE — Progress Notes (Signed)
eLink Physician-Brief Progress Note Patient Name: Todd Robertson DOB: 24-Jul-1946 MRN: [id number]   Date of Service  05/20/2020  HPI/Events of Note  Altered LOC - Nursing concerned about ability to protect airway.   eICU Interventions  Plan: 1. Will ask ground team to evaluate at bedside for need to intubate and ventilate.      Intervention Category Major Interventions: Delirium, psychosis, severe agitation - evaluation and management  Jaquilla Woodroof Eugene 05/20/2020, 7:58 PM

## 2020-05-20 NOTE — Procedures (Signed)
Endotracheal Intubation Procedure Note  Indication for endotracheal intubation: respiratory failure. Airway Assessment: Mallampati Class: III (soft and hard palate and base of uvula visible). Sedation: etomidate and fentanyl. Paralytic: none, (K 6.0). Lidocaine: no. Atropine: no. Equipment: Macintosh 3 laryngoscope blade. Cricoid Pressure: no. Number of attempts: 2. ETT location confirmed by by auscultation and ETCO2 monitor.  Shellia Cleverly 05/20/2020

## 2020-05-20 NOTE — Procedures (Signed)
Interventional Radiology Procedure Note  Procedure: Placement of a right IJ approach triple lumen HD catheter.   Tip is positioned at the [facility] and catheter is ready for immediate use.   May be converted to tunneled when needed  Complications: None  Recommendations:  - OK to use - routine wound care -  Routine catheter care  Signed,  Dulcy Fanny. Earleen Newport, DO

## 2020-05-20 NOTE — Progress Notes (Signed)
Patients mentation is worsening, speech is mumbled whereas this morning he was able to tell me his name & year. Patient is arousable to voice but attention is diminished. Throat sounds wet but according to family he normally sounds like this. Dr Johnney Ou and Dr. Valeta Harms notified. Orders for ABG and treating hyperkalemia with D50 and insulin for the 2nd time today. Dr. Johnney Ou notified HD to prioritize hm next however HD does not have anyone to send until 1800. Will continue to monitor.  Dewaine Oats, RN

## 2020-05-20 NOTE — Progress Notes (Signed)
4 Days Post-Op   Subjective/Chief Complaint:  1 - RIGHT Renal Neoplasm - s/p right robotic radical nephrectomy 05/16/20 for large Rt mass. Path pending.  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 S/p right nephrectomy 7/9. Initially near anuric POD 0. CT 7/9 PM unremarkable left (solitary) kidney. Continued poor urine output, nephrology consulted, considering dialysis in the next few days. Not today.   3- Acute Blood Loss Anemia - Hgb 8s pos-op (after 3L fluid). No tachycardia. CT with expected post-op changes, no large hematomas. Transfusion x 1u pRBC 7/9, again on 7/11. Hb 7 today.  4 - Post-Op Ileus - pt initially tollerating PO intake post-op. Some new emesis POD 2 and abd exam with new gasseous distension. Suppository given 7/11. Now with multiple bowel movements and passing flatus 7/12. Distended but feels as though at baseline.   Patient with new onset delirium overnight, nurse reports pulling at lines though otherwise oriented. Scant urine output, 100cc.   Objective: Vital signs in last 24 hours: Temp:  [98.5 F (36.9 C)-99 F (37.2 C)] 98.5 F (36.9 C) (07/13 0700) Pulse Rate:  [55-92] 82 (07/13 0900) Resp:  [19-29] 26 (07/13 0900) BP: (95-145)/(48-98) 113/62 (07/13 0900) SpO2:  [76 %-100 %] 100 % (07/13 0900) Last BM Date: 05/19/20  Intake/Output from previous day: 07/12 0701 - 07/13 0700 In: 833.4 [P.O.:700; I.V.:133.4] Out: 180 [Urine:180] Intake/Output this shift: Total I/O In: 60 [P.O.:60] Out: -    General appearance: alert, cooperative and very pleasant.  Eyes: negative Nose: Nares normal. Septum midline. Mucosa normal. No drainage or sinus tenderness. Throat: lips, mucosa, and tongue normal; teeth and gums normal Neck: supple, symmetrical, trachea midline Back: symmetric, no curvature. ROM normal. No CVA tenderness. Resp: non-labored on [place] O2 Cardio: HR 70s on monitor, regular.  Male genitalia: normal, foley in place  wtih concenrated appearing urine that is non-foul.  Extremities: extremities normal, atraumatic, no cyanosis or edema Skin: Skin color, texture, turgor normal. No rashes or lesions  Abd obese, no r/g. Rt sided port and extraction sites c/d/i. Large right flank ecchymosis. Distended but soft, nontender abdomen.   Lab Results:  Recent Labs    05/19/20 0722 05/20/20 0324  WBC 18.6* 15.0*  HGB 7.0* 7.0*  HCT 22.9* 22.5*  PLT 124* 145*   BMET Recent Labs    05/19/20 0722 05/20/20 0324  NA 129* 129*  K 5.4* 6.1*  CL 96* 96*  CO2 24 22  GLUCOSE 180* 115*  BUN 81* 96*  CREATININE 7.44* 8.84*  CALCIUM 8.0* 8.2*   PT/INR No results for input(s): LABPROT, INR in the last 72 hours. ABG No results for input(s): PHART, HCO3 in the last 72 hours.  Invalid input(s): PCO2, PO2  Studies/Results: DG CHEST PORT 1 VIEW  Result Date: 05/19/2020 CLINICAL DATA:  Shortness of breath.  Acute kidney injury. EXAM: PORTABLE CHEST 1 VIEW COMPARISON:  Single-view of the chest 05/18/2020. CT abdomen and pelvis 05/16/2020. FINDINGS: Small bilateral pleural effusions and basilar atelectasis are greater on the right. Pleural effusion on the right is slightly increased. Heart size is normal. No pneumothorax. No acute bony abnormality. IMPRESSION: Small bilateral pleural effusions and basilar atelectasis. Effusion on the right has slightly increased since the prior chest film. Electronically Signed   By: Inge Rise M.D.   On: 05/19/2020 13:07    Anti-infectives: Anti-infectives (From admission, onward)   Start     Dose/Rate Route Frequency Ordered Stop   05/16/20 1700  ceFAZolin (ANCEF) IVPB 1 g/50 mL premix        1 g 100 mL/hr over 30 Minutes Intravenous Every 8 hours 05/16/20 1600 05/17/20 0206   05/16/20 0600  ceFAZolin (ANCEF) 3 g in dextrose 5 % 50 mL IVPB        3 g 100 mL/hr over 30 Minutes Intravenous 30 min pre-op 05/15/20 0725 05/16/20 1944      Assessment/Plan:  1 - RIGHT Renal  Neoplasm - no further cancer directed therapy this admission.   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 Solitary kidney w/o hydro or significant atrophy on imaging this admission.  Appreciate nephrology and CCM comanagement in determining timing/plan to initiate HD.    3- Acute Blood Loss Anemia - stable today, low suspicion for any significant ongoing bleeding. Hgb drift at this point likley more equilbration / dilutional. Agree with PRN transfusion again to optimise pre-renal factors.   4 - Post-Op Ileus - resolved, patient now having bowel movements  5 - Delirium - new onset overnight, possible related to uremia vs ICU delirium   Carmie Kanner 05/20/2020

## 2020-05-20 NOTE — Progress Notes (Signed)
Called to see for worsening obtundation.  Last Abg with pH 7.2 and PC02 60.  We repeated ABG after 4 hours of BIPAP and it was essentially unchanged, bicarb normal.  Discussed situation with his wife, daughter and sister and decision was made to intubate.

## 2020-05-20 NOTE — Progress Notes (Addendum)
[facility] Progress Note  Subjective: seen in room, denies complaints - eating breakfast.  Denies nausea, chest pain, cramps, dyspnea.   Vitals:   05/20/20 0600 05/20/20 0700 05/20/20 0800 05/20/20 0900  BP: (!) 128/98 (!) 132/59 (!) 132/57 113/62  Pulse: 86 77 79 82  Resp: (!) 24 (!) 25 (!) 25 (!) 26  Temp:  98.5 F (36.9 C)    TempSrc:  Oral    SpO2: 100% 100% 100% 100%  Weight:      Height:        Exam: Gen large-framed AAM no distress, alert and O x3 No jvd or bruits Chest clear bilat to bases no rales or wheezing RRR no MRG Abd marked abd obesity, lap wounds in R abdomen, distended, nontender GU normal male w/ foley draining clear yellow urine MS no joint effusions or deformity Ext 1+ bilat UE edema, no sig LE edema Neuro is Ox 3 , nf, no asterixis    Home meds:  - asa 44m/ lipitor 40 hs/ benazepril 20 am/ bumex 157mqd/ coreg 25 bid  - allopurinol 300 hs  - symbicort ACT 2 puff bid  - prn's/ vitamins/ supplements  Date                             Creat               egFR   2018                          1.79- 2.38        30- 43   2019                          1.80- 1.81        42-43   January 12, 2020           3.00                    May 13, 2020                2.60                 27   Jul 9                           2.51                 29- 33   Jul 10                         3.42, 3.89    UA none yet   CT abd wo contrast 7/9 - Adrenals/Urinary Tract: Post right nephrectomy. Large volume of hemorrhage in the right nephrectomy bed. Ill-defined air fluid collection tracks in the right retroperitoneum anterior to the iliopsoas muscle and laterally in the abdominal cavity. Hemorrhage tracks into the right aspect of the pelvis in the is only partially included in the field of view. There is moderate subcutaneous emphysema involving the right abdominal wall. Small foci of air dissects between the right abdominal wall musculature. Simple cyst in the  left kidney. There is no left hydronephrosis.     Assessment/ Plan: 1. AoCKD IV - b/l creat 2.60, eGFR 27. AKI expected to some degree after R nephrectomy  on 7/9, but UOP never picked up despite sig volume loading (+9 L net). Remains lucid creatinine trending up and UOP low, now with hyponatremia and hyperkalemia.  Proceed with HD catheter placement and HD --> consulted IR.  If noon repeat K improved can wait for [facility] placement tomorrow versus temp catheter which would need to be converted as I suspect this will be ESRD.  D/w patient today need for long term HD most likely scenario.  2. Renal mass - sp R nephrectomy 05/16/20 3. Anemia ckd / abl - Hb down 12 > 10 > 8.1> 7.6> 7.0. Transfuse as needed.   Will check iron indices and plan to start ESA during admission 4. HTN -  coreg dc'd w/ some soft BP's 5. Somnolence - better, changed to IV dilaudid, wean off IV pain meds asap 6. Hyponatremia - hypervolemic, secondary to #1 7. Hyperkalemia - lokelma 10g, insulin and dextrose now.  Repeat at noon ordered.   ADDENDUM:  Spoke with family at length bedside.  They are concerned about increasing somnolence and patient is more sedated than when I initially saw him.  Will request temp cath be placed today with IR and proceed with HD #1 today.    Jannifer Hick MD [facility] Pager [phone]   Recent Labs  Lab 05/19/20 [phone] 05/20/20 0324  K 5.4* 6.1*  BUN 81* 96*  CREATININE 7.44* 8.84*  CALCIUM 8.0* 8.2*  HGB 7.0* 7.0*   Inpatient medications: . aspirin  81 mg Oral Daily  . Chlorhexidine Gluconate Cloth  6 each Topical Daily  . insulin aspart  5 Units Intravenous Once   And  . dextrose  1 ampule Intravenous Once  . docusate sodium  100 mg Oral BID  . famotidine  10 mg Oral Daily  . insulin aspart  0-9 Units Subcutaneous TID WC  . mouth rinse  15 mL Mouth Rinse BID  . mometasone-formoterol  2 puff Inhalation BID  . sodium zirconium cyclosilicate  10 g Oral Once     albuterol, alum & mag hydroxide-simeth, diphenhydrAMINE **OR** diphenhydrAMINE, menthol-cetylpyridinium, ondansetron, oxyCODONE, promethazine, tiZANidine

## 2020-05-20 NOTE — Progress Notes (Signed)
eLink Physician-Brief Progress Note Patient Name: Todd Robertson DOB: 1946-10-05 MRN: [id number]   Date of Service  05/20/2020  HPI/Events of Note  Multiple issues: 1. No AM labs and 2. EKG reveals possible anterior lateral ischemia.   eICU Interventions  Plan: 1. CBC with platelets, BMP and Mg++ in AM.  2. Cycle Troponin. 3. ASA 81 mg PO now and Q day.      Intervention Category Major Interventions: Other:  Ohm Dentler [name] 05/20/2020, 2:30 AM

## 2020-05-20 NOTE — Progress Notes (Signed)
NAME:  Todd Robertson, MRN:  [id number], DOB:  09/04/1946, LOS: 4 ADMISSION DATE:  05/16/2020, CONSULTATION DATE:  05/16/2020 REFERRING MD:  Dr Alyson Ingles, CHIEF COMPLAINT:  Shock   Brief History    74 year old male w/ hx as outlined below. Presented 7/9 for planned right Nephrectomy. Went to the OR that day. EBL estimated at 35ml. Intraoperative anesthesia note unremarkable. While in the PACU had declining w/ systolic BPs in 62I. Placed in Trendelenburg; IVF boluses administered (3 liters crystalloid and 250 albumin). BP normalized but only made 66ml of urine.  Because of this PCCM was asked to assess for circulatory shock and concern about hemodynamics  Past Medical History   Past Medical History:  Diagnosis Date  . Arthritis   . Cancer [facility])    prostate  . Cardiomyopathy    secondary  . CKD (chronic kidney disease)   . DM2 (diabetes mellitus, type 2) ([place])   . Gout   . HTN (hypertension)    unspec  . Hypercholesterolemia   . Nonischemic cardiomyopathy ([place])    a. EF 40-50% by most recent 2-D echo b. EF 25% by cardiac cath in 2004  . Overweight(278.02)     Significant Hospital Events   05/16/2020-right nephrectomy  PCCM asked to see post-op for post-operative hypotension. Baseline hgb: 12.3; post-op hgb 10.7 (in PACU). EBL intra-op 341ml.   Consults:  pccm  Procedures:  Robotic-right nephrectomy for papillary RCC   Significant Diagnostic Tests:   CT abdomen 7/9 IMPRESSION: 1. Post right nephrectomy. Large volume of right retroperitoneal hemorrhage with heterogeneous air and fluid which tracks into the pelvis and right upper quadrant. 2. Moderate subcutaneous emphysema involving the right abdominal wall. 3. Small right pleural effusion. Small pericardial effusion.  Micro Data:    Antimicrobials:  7/9-cefazolin  Interim history/subjective:   Family at bedside. Patient more somnolent this morning. Case discussed with nephrology.   Objective   Blood pressure  113/62, pulse 82, temperature 98.5 F (36.9 C), temperature source Oral, resp. rate (!) 26, height 6\' 2"  (1.88 m), weight 132 kg, SpO2 100 %.        Intake/Output Summary (Last 24 hours) at 05/20/2020 0910 Last data filed at 05/20/2020 0800 Gross per 24 hour  Intake 313.29 ml  Output 150 ml  Net 163.29 ml   Filed Weights   05/16/20 0532 05/16/20 1635 05/16/20 1900  Weight: 126.6 kg 132 kg 132 kg    Examination: General: obese male HENT: large neck Lungs: CTAB, no wheeze  Cardiovascular: RRR, s1 s2  Abdomen: soft, nt nd  Extremities: BL edema  Neuro: AAOx3, however more somnolent today, less interactive  GU: defered  Resolved Hospital Problem list     Assessment & Plan:   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 - case discussed with nephrology  - IVFs stopped  - mental status worse  - plans for tunneled catheter and HD to start  - following electrolytes  - now with more confusion I think we should keep him in the ICU until his first HD session is over  - I explained this to the patients family   Acute metabolic encephalopathy Uremic encephalopathy  - planning for HD   Postop hypotension, s/p nephrectomy for renal cell cancer - resolved   Type 2 diabetes - CBGS with SSI   Heart failure with preserved ejection fraction - he will need volume removal with HD  - This will help improve this problem   Renal cell  cancer - s/p nephrectomy   Past history of smoking - scheduled BDs    Best practice:  Diet: Renal diet Pain/Anxiety/Delirium protocol (if indicated): prn oxy  VAP protocol (if indicated): Not indicated DVT prophylaxis: SCD GI prophylaxis: pepcid Glucose control: monitor Mobility: bedrest Code Status: full Family Communication: I spoke with family at bedside  Disposition: ICU  Labs    CBC: Recent Labs  Lab 05/17/20 1049 05/18/20 0240 05/18/20 2002 05/19/20 0722 05/20/20 0324  WBC  --  20.2*  --  18.6* 15.0*  NEUTROABS   --  16.9*  --  15.9* 12.4*  HGB 8.1* 7.6* 7.5* 7.0* 7.0*  HCT 25.6* 24.5* 24.3* 22.9* 22.5*  MCV  --  91.8  --  92.7 94.5  PLT  --  141*  --  124* 145*    Basic Metabolic Panel: Recent Labs  Lab 05/17/20 1049 05/18/20 0240 05/18/20 2002 05/19/20 0722 05/20/20 0324  NA 135 134* 132* 129* 129*  K 4.8 4.9 5.7* 5.4* 6.1*  CL 96* 95* 98 96* 96*  CO2 28 29 25 24 22   GLUCOSE 208* 200* 160* 180* 115*  BUN 55* 66* 74* 81* 96*  CREATININE 3.89* 5.11* 6.66* 7.44* 8.84*  CALCIUM 8.1* 8.3* 8.0* 8.0* 8.2*  MG  --   --   --  2.3 2.7*   GFR: Estimated Creatinine Clearance: 10.9 mL/min (A) (by C-G formula based on SCr of 8.84 mg/dL (H)). Recent Labs  Lab 05/18/20 0240 05/19/20 0722 05/20/20 0324  WBC 20.2* 18.6* 15.0*    Liver Function Tests: Recent Labs  Lab 05/18/20 2002  AST 25  ALT 8  ALKPHOS 42  BILITOT 0.7  PROT 5.4*  ALBUMIN 3.1*   No results for input(s): LIPASE, AMYLASE in the last 168 hours. No results for input(s): AMMONIA in the last 168 hours.  ABG No results found for: PHART, PCO2ART, PO2ART, HCO3, TCO2, ACIDBASEDEF, O2SAT   Coagulation Profile: No results for input(s): INR, PROTIME in the last 168 hours.  Cardiac Enzymes: No results for input(s): CKTOTAL, CKMB, CKMBINDEX, TROPONINI in the last 168 hours.  HbA1C: Hgb A1c MFr Bld  Date/Time Value Ref Range Status  05/13/2020 08:45 AM 6.2 (H) 4.8 - 5.6 % Final    Comment:    (NOTE) Pre diabetes:          5.7%-6.4%  Diabetes:              >6.4%  Glycemic control for   <7.0% adults with diabetes   11/11/2017 03:03 PM 5.8 (H) 4.8 - 5.6 % Final    Comment:    (NOTE) Pre diabetes:          5.7%-6.4% Diabetes:              >6.4% Glycemic control for   <7.0% adults with diabetes     CBG: Recent Labs  Lab 05/19/20 0749 05/19/20 1141 05/19/20 1644 05/19/20 2158 05/20/20 0720  GLUCAP 162* 158* 136* 112* 125*    This patient is critically ill with multiple organ system failure; which,  requires frequent high complexity decision making, assessment, support, evaluation, and titration of therapies. This was completed through the application of advanced monitoring technologies and extensive interpretation of multiple databases. During this encounter critical care time was devoted to patient care services described in this note for 32 minutes.  Garner Nash, DO [facility] 05/20/2020 9:10 AM

## 2020-05-21 ENCOUNTER — Inpatient Hospital Stay (HOSPITAL_COMMUNITY): Payer: Medicare Other

## 2020-05-21 ENCOUNTER — Ambulatory Visit: Payer: Medicare Other | Admitting: Urology

## 2020-05-21 LAB — RENAL FUNCTION PANEL
Albumin: 2.7 g/dL — ABNORMAL LOW (ref 3.5–5.0)
Albumin: 2.5 g/dL — ABNORMAL LOW (ref 3.5–5.0)
Anion gap: 11 (ref 5–15)
Anion gap: 15 (ref 5–15)
BUN: 110 mg/dL — ABNORMAL HIGH (ref 8–23)
BUN: 102 mg/dL — ABNORMAL HIGH (ref 8–23)
CO2: 21 mmol/L — ABNORMAL LOW (ref 22–32)
CO2: 18 mmol/L — ABNORMAL LOW (ref 22–32)
Calcium: 8 mg/dL — ABNORMAL LOW (ref 8.9–10.3)
Calcium: 8 mg/dL — ABNORMAL LOW (ref 8.9–10.3)
Chloride: 96 mmol/L — ABNORMAL LOW (ref 98–111)
Chloride: 97 mmol/L — ABNORMAL LOW (ref 98–111)
Creatinine, Ser: 9.46 mg/dL — ABNORMAL HIGH (ref 0.61–1.24)
Creatinine, Ser: 8.82 mg/dL — ABNORMAL HIGH (ref 0.61–1.24)
GFR calc Af Amer: 6 mL/min — ABNORMAL LOW (ref >60)
GFR calc Af Amer: 6 mL/min — ABNORMAL LOW (ref >60)
GFR calc non Af Amer: 5 mL/min — ABNORMAL LOW (ref >60)
GFR calc non Af Amer: 5 mL/min — ABNORMAL LOW (ref >60)
Glucose, Bld: 84 mg/dL (ref 70–99)
Glucose, Bld: 146 mg/dL — ABNORMAL HIGH (ref 70–99)
Phosphorus: 5.8 mg/dL — ABNORMAL HIGH (ref 2.5–4.6)
Phosphorus: 5.9 mg/dL — ABNORMAL HIGH (ref 2.5–4.6)
Potassium: 5.4 mmol/L — ABNORMAL HIGH (ref 3.5–5.1)
Potassium: 6.2 mmol/L — ABNORMAL HIGH (ref 3.5–5.1)
Sodium: 128 mmol/L — ABNORMAL LOW (ref 135–145)
Sodium: 130 mmol/L — ABNORMAL LOW (ref 135–145)

## 2020-05-21 LAB — CBC
HCT: 22.4 % — ABNORMAL LOW (ref 39.0–52.0)
HCT: 24.5 % — ABNORMAL LOW (ref 39.0–52.0)
Hemoglobin: 6.7 g/dL — CL (ref 13.0–17.0)
Hemoglobin: 8 g/dL — ABNORMAL LOW (ref 13.0–17.0)
MCH: 28.2 pg (ref 26.0–34.0)
MCH: 29.9 pg (ref 26.0–34.0)
MCHC: 29.9 g/dL — ABNORMAL LOW (ref 30.0–36.0)
MCHC: 32.7 g/dL (ref 30.0–36.0)
MCV: 94.1 fL (ref 80.0–100.0)
MCV: 91.4 fL (ref 80.0–100.0)
Platelets: 163 10*3/uL (ref 150–400)
Platelets: 167 10*3/uL (ref 150–400)
RBC: 2.38 MIL/uL — ABNORMAL LOW (ref 4.22–5.81)
RBC: 2.68 MIL/uL — ABNORMAL LOW (ref 4.22–5.81)
RDW: 16 % — ABNORMAL HIGH (ref 11.5–15.5)
RDW: 15.9 % — ABNORMAL HIGH (ref 11.5–15.5)
WBC: 9.4 10*3/uL (ref 4.0–10.5)
WBC: 16.9 10*3/uL — ABNORMAL HIGH (ref 4.0–10.5)
nRBC: 1.3 % — ABNORMAL HIGH (ref 0.0–0.2)
nRBC: 0.4 % — ABNORMAL HIGH (ref 0.0–0.2)

## 2020-05-21 LAB — SURGICAL PATHOLOGY

## 2020-05-21 LAB — POCT ACTIVATED CLOTTING TIME
Activated Clotting Time: 120 seconds
Activated Clotting Time: 125 seconds
Activated Clotting Time: 136 seconds
Activated Clotting Time: 147 seconds
Activated Clotting Time: 158 seconds
Activated Clotting Time: 153 seconds
Activated Clotting Time: 147 seconds

## 2020-05-21 LAB — GLUCOSE, CAPILLARY
Glucose-Capillary: 100 mg/dL — ABNORMAL HIGH (ref 70–99)
Glucose-Capillary: 152 mg/dL — ABNORMAL HIGH (ref 70–99)
Glucose-Capillary: 158 mg/dL — ABNORMAL HIGH (ref 70–99)
Glucose-Capillary: 165 mg/dL — ABNORMAL HIGH (ref 70–99)
Glucose-Capillary: 64 mg/dL — ABNORMAL LOW (ref 70–99)
Glucose-Capillary: 97 mg/dL (ref 70–99)

## 2020-05-21 LAB — MAGNESIUM: Magnesium: 2.5 mg/dL — ABNORMAL HIGH (ref 1.7–2.4)

## 2020-05-21 LAB — PREPARE RBC (CROSSMATCH)

## 2020-05-21 MED ORDER — PHENYLEPHRINE CONCENTRATED 100MG/250ML (0.4 MG/ML) INFUSION SIMPLE
0.0000 ug/min | INTRAVENOUS | Status: DC
  Administered 2020-05-21: 200 ug/min via INTRAVENOUS
  Administered 2020-05-21: 100 ug/min via INTRAVENOUS
  Administered 2020-05-21: 165 ug/min via INTRAVENOUS
  Administered 2020-05-22: 350 ug/min via INTRAVENOUS
  Administered 2020-05-22: 400 ug/min via INTRAVENOUS
  Filled 2020-05-21 (×7): qty 250

## 2020-05-21 MED ORDER — SODIUM CHLORIDE 0.9 % IV SOLN
2.0000 g | Freq: Two times a day (BID) | INTRAVENOUS | Status: DC
  Administered 2020-05-21 – 2020-05-25 (×10): 2 g via INTRAVENOUS
  Filled 2020-05-21 (×11): qty 2

## 2020-05-21 MED ORDER — SODIUM CHLORIDE 0.9% FLUSH
10.0000 mL | Freq: Two times a day (BID) | INTRAVENOUS | Status: DC
  Administered 2020-05-21 – 2020-06-03 (×26): 10 mL
  Administered 2020-06-03: 20 mL
  Administered 2020-06-04 – 2020-06-09 (×8): 10 mL

## 2020-05-21 MED ORDER — DOCUSATE SODIUM 50 MG/5ML PO LIQD
100.0000 mg | Freq: Two times a day (BID) | ORAL | Status: DC
  Administered 2020-05-21 – 2020-05-23 (×5): 100 mg
  Filled 2020-05-21 (×5): qty 10

## 2020-05-21 MED ORDER — VANCOMYCIN HCL 10 G IV SOLR
2500.0000 mg | Freq: Once | INTRAVENOUS | Status: AC
  Administered 2020-05-21: 2500 mg via INTRAVENOUS
  Filled 2020-05-21: qty 2500

## 2020-05-21 MED ORDER — ALTEPLASE 2 MG IJ SOLR
2.0000 mg | Freq: Once | INTRAMUSCULAR | Status: AC
  Administered 2020-05-21: 2 mg
  Filled 2020-05-21: qty 2

## 2020-05-21 MED ORDER — VANCOMYCIN VARIABLE DOSE PER UNSTABLE RENAL FUNCTION (PHARMACIST DOSING): Status: DC

## 2020-05-21 MED ORDER — VITAL AF 1.2 CAL PO LIQD
1000.0000 mL | ORAL | Status: DC
  Administered 2020-05-21: 1000 mL

## 2020-05-21 MED ORDER — SODIUM CHLORIDE 0.9 % IV SOLN
250.0000 [IU]/h | INTRAVENOUS | Status: DC
  Administered 2020-05-21: 250 [IU]/h via INTRAVENOUS_CENTRAL
  Administered 2020-05-21: 1750 [IU]/h via INTRAVENOUS_CENTRAL
  Administered 2020-05-21: 1150 [IU]/h via INTRAVENOUS_CENTRAL
  Administered 2020-05-22: 2250 [IU]/h via INTRAVENOUS_CENTRAL
  Administered 2020-05-22: 2550 [IU]/h via INTRAVENOUS_CENTRAL
  Filled 2020-05-21 (×6): qty 2

## 2020-05-21 MED ORDER — CHLORHEXIDINE GLUCONATE 0.12% ORAL RINSE (MEDLINE KIT)
15.0000 mL | Freq: Two times a day (BID) | OROMUCOSAL | Status: DC
  Administered 2020-05-21 – 2020-06-09 (×34): 15 mL via OROMUCOSAL

## 2020-05-21 MED ORDER — PROSOURCE TF PO LIQD
45.0000 mL | Freq: Four times a day (QID) | ORAL | Status: DC
  Administered 2020-05-21 – 2020-05-23 (×9): 45 mL
  Filled 2020-05-21 (×10): qty 45

## 2020-05-21 MED ORDER — FAMOTIDINE 20 MG PO TABS
20.0000 mg | ORAL_TABLET | Freq: Every day | ORAL | Status: DC
  Administered 2020-05-22 – 2020-05-23 (×2): 20 mg
  Filled 2020-05-21 (×2): qty 1

## 2020-05-21 MED ORDER — MIDAZOLAM 50MG/50ML (1MG/ML) PREMIX INFUSION
0.5000 mg/h | INTRAVENOUS | Status: DC
  Administered 2020-05-21: 4 mg/h via INTRAVENOUS

## 2020-05-21 MED ORDER — SODIUM CHLORIDE 0.9% IV SOLUTION: Freq: Once | INTRAVENOUS | Status: AC

## 2020-05-21 MED ORDER — ASPIRIN 81 MG PO CHEW
81.0000 mg | CHEWABLE_TABLET | Freq: Every day | ORAL | Status: DC
  Administered 2020-05-22 – 2020-05-23 (×3): 81 mg
  Filled 2020-05-21 (×2): qty 1

## 2020-05-21 MED ORDER — FENTANYL CITRATE (PF) 100 MCG/2ML IJ SOLN: 25.0000 ug | INTRAMUSCULAR | Status: DC | PRN

## 2020-05-21 MED ORDER — DEXTROSE 50 % IV SOLN
INTRAVENOUS | Status: AC
  Administered 2020-05-21: 12.5 g via INTRAVENOUS
  Filled 2020-05-21: qty 50

## 2020-05-21 MED ORDER — MIDAZOLAM 50MG/50ML (1MG/ML) PREMIX INFUSION
2.0000 mg/h | INTRAVENOUS | Status: DC
  Administered 2020-05-21: 2 mg/h via INTRAVENOUS
  Filled 2020-05-21: qty 50

## 2020-05-21 MED ORDER — VANCOMYCIN HCL 1250 MG/250ML IV SOLN: 1250.0000 mg | INTRAVENOUS | Status: DC

## 2020-05-21 MED ORDER — DIPHENHYDRAMINE HCL 12.5 MG/5ML PO ELIX
12.5000 mg | ORAL_SOLUTION | Freq: Four times a day (QID) | ORAL | Status: DC | PRN
  Filled 2020-05-21: qty 5

## 2020-05-21 MED ORDER — MIDAZOLAM HCL 2 MG/2ML IJ SOLN
INTRAMUSCULAR | Status: AC
  Administered 2020-05-21: 2 mg via INTRAVENOUS
  Filled 2020-05-21: qty 2

## 2020-05-21 MED ORDER — SODIUM CHLORIDE 0.9% FLUSH
10.0000 mL | INTRAVENOUS | Status: DC | PRN
  Administered 2020-05-25 – 2020-06-01 (×2): 10 mL

## 2020-05-21 MED ORDER — DIPHENHYDRAMINE HCL 50 MG/ML IJ SOLN: 12.5000 mg | Freq: Four times a day (QID) | INTRAMUSCULAR | Status: DC | PRN

## 2020-05-21 MED ORDER — MIDAZOLAM HCL 2 MG/2ML IJ SOLN: 2.0000 mg | Freq: Once | INTRAMUSCULAR | Status: AC

## 2020-05-21 MED ORDER — TIZANIDINE HCL 4 MG PO TABS
4.0000 mg | ORAL_TABLET | Freq: Three times a day (TID) | ORAL | Status: DC | PRN
  Filled 2020-05-21: qty 1

## 2020-05-21 MED ORDER — ORAL CARE MOUTH RINSE
15.0000 mL | OROMUCOSAL | Status: DC
  Administered 2020-05-21 – 2020-05-24 (×30): 15 mL via OROMUCOSAL

## 2020-05-21 MED ORDER — HEPARIN BOLUS VIA INFUSION (CRRT)
1000.0000 [IU] | INTRAVENOUS | Status: DC | PRN
  Administered 2020-05-21 (×4): 1000 [IU] via INTRAVENOUS_CENTRAL
  Filled 2020-05-21: qty 1000

## 2020-05-21 MED ORDER — DEXTROSE 50 % IV SOLN: 12.5000 g | INTRAVENOUS | Status: AC

## 2020-05-21 MED ORDER — VANCOMYCIN HCL 1250 MG/250ML IV SOLN
1250.0000 mg | INTRAVENOUS | Status: DC
  Administered 2020-05-22 – 2020-05-23 (×2): 1250 mg via INTRAVENOUS
  Filled 2020-05-21 (×2): qty 250

## 2020-05-21 MED ORDER — ACETAMINOPHEN 650 MG RE SUPP: 650.0000 mg | Freq: Four times a day (QID) | RECTAL | Status: DC | PRN

## 2020-05-21 MED ORDER — INSULIN ASPART 100 UNIT/ML ~~LOC~~ SOLN
0.0000 [IU] | SUBCUTANEOUS | Status: DC
  Administered 2020-05-21 (×3): 2 [IU] via SUBCUTANEOUS
  Administered 2020-05-22: 1 [IU] via SUBCUTANEOUS
  Administered 2020-05-22 (×5): 2 [IU] via SUBCUTANEOUS
  Administered 2020-05-23 – 2020-05-26 (×18): 1 [IU] via SUBCUTANEOUS
  Administered 2020-05-27 (×3): 2 [IU] via SUBCUTANEOUS
  Administered 2020-05-27: 1 [IU] via SUBCUTANEOUS
  Administered 2020-05-27 (×2): 2 [IU] via SUBCUTANEOUS
  Administered 2020-05-27 – 2020-05-28 (×6): 1 [IU] via SUBCUTANEOUS
  Administered 2020-05-28 – 2020-05-29 (×6): 2 [IU] via SUBCUTANEOUS
  Administered 2020-05-29: 1 [IU] via SUBCUTANEOUS
  Administered 2020-05-30: 2 [IU] via SUBCUTANEOUS
  Administered 2020-05-30: 1 [IU] via SUBCUTANEOUS
  Administered 2020-05-30: 2 [IU] via SUBCUTANEOUS
  Administered 2020-05-30: 1 [IU] via SUBCUTANEOUS
  Administered 2020-05-30 (×2): 2 [IU] via SUBCUTANEOUS
  Administered 2020-05-31: 1 [IU] via SUBCUTANEOUS
  Administered 2020-05-31 (×5): 2 [IU] via SUBCUTANEOUS
  Administered 2020-06-01 (×2): 1 [IU] via SUBCUTANEOUS
  Administered 2020-06-01 (×2): 2 [IU] via SUBCUTANEOUS
  Administered 2020-06-01: 1 [IU] via SUBCUTANEOUS
  Administered 2020-06-02: 5 [IU] via SUBCUTANEOUS
  Administered 2020-06-02 (×2): 2 [IU] via SUBCUTANEOUS
  Administered 2020-06-02: 3 [IU] via SUBCUTANEOUS
  Administered 2020-06-02: 5 [IU] via SUBCUTANEOUS
  Administered 2020-06-02 (×2): 1 [IU] via SUBCUTANEOUS
  Administered 2020-06-03 (×2): 5 [IU] via SUBCUTANEOUS
  Administered 2020-06-03 (×2): 3 [IU] via SUBCUTANEOUS
  Administered 2020-06-03: 5 [IU] via SUBCUTANEOUS
  Administered 2020-06-04: 7 [IU] via SUBCUTANEOUS
  Administered 2020-06-04: 9 [IU] via SUBCUTANEOUS
  Administered 2020-06-04: 7 [IU] via SUBCUTANEOUS
  Administered 2020-06-05: 3 [IU] via SUBCUTANEOUS
  Administered 2020-06-05: 9 [IU] via SUBCUTANEOUS
  Administered 2020-06-05: 3 [IU] via SUBCUTANEOUS
  Administered 2020-06-05: 5 [IU] via SUBCUTANEOUS
  Administered 2020-06-05: 7 [IU] via SUBCUTANEOUS
  Administered 2020-06-05: 5 [IU] via SUBCUTANEOUS
  Administered 2020-06-06 (×2): 7 [IU] via SUBCUTANEOUS
  Administered 2020-06-06: 3 [IU] via SUBCUTANEOUS
  Administered 2020-06-06: 5 [IU] via SUBCUTANEOUS
  Administered 2020-06-06: 3 [IU] via SUBCUTANEOUS
  Administered 2020-06-07: 2 [IU] via SUBCUTANEOUS
  Administered 2020-06-07: 7 [IU] via SUBCUTANEOUS
  Administered 2020-06-07: 1 [IU] via SUBCUTANEOUS
  Administered 2020-06-07: 7 [IU] via SUBCUTANEOUS
  Administered 2020-06-08 (×2): 2 [IU] via SUBCUTANEOUS
  Administered 2020-06-08 – 2020-06-09 (×3): 1 [IU] via SUBCUTANEOUS

## 2020-05-21 NOTE — Procedures (Signed)
Central Venous Hemodialysis Catheter Exchange Procedure Note  LENNON BOUTWELL  [id number]  03-11-1946  Date:05/21/20  Time:10:56 AM   Provider Performing:Brooke Moshe Cipro / Montey Hora  Procedure: Exchange of Non-tunneled Central Venous [phone]) without US guidance  Indication(s) Current R IJ triple lumen HD catheter with flow/ blood return issues; CRRT not able to run s/p tpa and flipping catheter  Consent Risks of the procedure as well as the alternatives and risks of each were explained to the patient and/or caregiver.  Consent for the procedure was obtained and is signed in the bedside chart   Timeout Verified patient identification, verified procedure, site/side was marked, verified correct patient position, special equipment/implants available, medications/allergies/relevant history reviewed, required imaging and test results available.  Sterile Technique Maximal sterile technique including full sterile barrier drape, hand hygiene, sterile gown, sterile gloves, mask, hair covering, sterile ultrasound probe cover (if used).   Procedure Description Area of catheter insertion and catheter was cleaned and draped.  was cleaned with chlorhexidine and draped in sterile fashion.  Using two providers, the catheter was exchanged over a wire.  A 20 cm catheter was inserted to 18 cm with nonpulsatile blood flow and easy flushing noted in all ports.  The catheter was sutured in place and sterile dressing applied.  Biopatch in place.  Complications/Tolerance None; patient tolerated the procedure well. Chest X-ray is ordered to verify placement pending.   EBL none  Specimen(s) None   Kennieth Rad, MSN, AGACNP-BC [facility] 05/21/2020, 11:00 AM  See Amion for personal pager PCCM on call pager [phone]

## 2020-05-21 NOTE — Progress Notes (Signed)
eLink Physician-Brief Progress Note Patient Name: Todd Robertson DOB: 08/12/1946 MRN: [id number]   Date of Service  05/21/2020  HPI/Events of Note  Fever to 102.0 - Nursing request for Tylenol. AST and ALT both normal. N/V will need to give rectally. Not on antibiotics currently.   eICU Interventions  Plan: 1. Blood cultures X 2. 2. Tracheal aspirate culture. 3. Tylenol suppository 650 mg Q 6 hour PRN Temp > 101.0 F. 4. Vancomycin and Cefepime per pharmacy consult.      Intervention Category Major Interventions: Other:  Lysle Dingwall 05/21/2020, 2:13 AM

## 2020-05-21 NOTE — Progress Notes (Signed)
Inpatient Diabetes Program Recommendations  AACE/ADA: New Consensus Statement on Inpatient Glycemic Control (2015)  Target Ranges:  Prepandial:   less than 140 mg/dL      Peak postprandial:   less than 180 mg/dL (1-2 hours)      Critically ill patients:  140 - 180 mg/dL   Lab Results  Component Value Date   GLUCAP 97 05/21/2020   HGBA1C 6.2 (H) 05/13/2020    Review of Glycemic Control Results for Todd Robertson, Todd Robertson (MRN [id number]) as of 05/21/2020 09:16  Ref. Range 05/20/2020 23:12 05/21/2020 07:18 05/21/2020 07:59  Glucose-Capillary Latest Ref Range: 70 - 99 mg/dL 102 (H) 64 (L) 97   Diabetes history: Type 2 DM Outpatient Diabetes medications: none Current orders for Inpatient glycemic control: Novolog 0-9 units TID Novolog 5 units x 2  Inpatient Diabetes Program Recommendations:    Noted mild low this am of 64 mg/dL. Patient received two one time doses of Novolog related to hyperkalemia. No recommendations. Follow.   Thanks, Bronson Curb, MSN, RNC-OB Diabetes Coordinator [phone] (8a-5p)

## 2020-05-21 NOTE — Progress Notes (Signed)
Pharmacy Antibiotic Note  Todd Robertson is a 74 y.o. male admitted on 05/16/2020 with renal cell carcinoma >> radical nephrectomy, now intubated in ICU and spiking fevers, concern for sepsis.  Pharmacy has been consulted for vancomycin and cefepime dosing.  CRRT was started overnight with plan to likely require long-term iHD.  Plan: Vancomycin 2500mg  x1 then 1250mg  IV every 24 hours.  Goal trough 15-20 mcg/mL.  Cefepime 2g IV every 12 hours.  Height: 6\' 2"  (188 cm) Weight: 132 kg (291 lb 0.1 oz) IBW/kg (Calculated) : 82.2  Temp (24hrs), Avg:99 F (37.2 C), Min:98.5 F (36.9 C), Max:99.5 F (37.5 C)  Recent Labs  Lab 05/18/20 0240 05/18/20 2002 05/19/20 0722 05/20/20 0324 05/20/20 1144  WBC 20.2*  --  18.6* 15.0*  --   CREATININE 5.11* 6.66* 7.44* 8.84* 9.41*    Estimated Creatinine Clearance: 10.2 mL/min (A) (by C-G formula based on SCr of 9.41 mg/dL (H)).    No Known Allergies   Thank you for allowing pharmacy to be a part of this patient's care.  Wynona Neat, PharmD, BCPS  05/21/2020 2:18 AM

## 2020-05-21 NOTE — Progress Notes (Signed)
PT with consistent small audible leak from ETT. Dr. Valeta Harms made aware and ETT advanced 2cm and CXR ordered.

## 2020-05-21 NOTE — Progress Notes (Addendum)
NAME:  Todd Robertson, MRN:  [id number], DOB:  29-Mar-1946, LOS: 5 ADMISSION DATE:  05/16/2020, CONSULTATION DATE:  05/16/2020 REFERRING MD:  Dr Alyson Ingles, CHIEF COMPLAINT:  Shock   Brief History    74 year old male w/ hx as outlined below. Presented 7/9 for planned right Nephrectomy. Went to the OR that day. EBL estimated at 377ml. Intraoperative anesthesia note unremarkable. While in the PACU had declining w/ systolic BPs in 54O. Placed in Trendelenburg; IVF boluses administered (3 liters crystalloid and 250 albumin). BP normalized but only made 25ml of urine.  Because of this PCCM was asked to assess for circulatory shock and concern about hemodynamics  Past Medical History   Past Medical History:  Diagnosis Date  . Arthritis   . Cancer [facility])    prostate  . Cardiomyopathy    secondary  . CKD (chronic kidney disease)   . DM2 (diabetes mellitus, type 2) ([place])   . Gout   . HTN (hypertension)    unspec  . Hypercholesterolemia   . Nonischemic cardiomyopathy ([place])    a. EF 40-50% by most recent 2-D echo b. EF 25% by cardiac cath in 2004  . Overweight(278.02)     Significant Hospital Events   05/16/2020-right nephrectomy  PCCM asked to see post-op for post-operative hypotension. Baseline hgb: 12.3; post-op hgb 10.7 (in PACU). EBL intra-op 373ml.   Consults:  pccm  Procedures:  Robotic-right nephrectomy for papillary RCC   Significant Diagnostic Tests:   CT abdomen 7/9 IMPRESSION: 1. Post right nephrectomy. Large volume of right retroperitoneal hemorrhage with heterogeneous air and fluid which tracks into the pelvis and right upper quadrant. 2. Moderate subcutaneous emphysema involving the right abdominal wall. 3. Small right pleural effusion. Small pericardial effusion.  Micro Data:  7/13 Blood cultures 7/13 Tracheal aspirate    Antimicrobials:  7/9-cefazolin 7/13  Cefepime > 7/13  Vancomycin >  Interim history/subjective:   Overnight noted patient became  somnolent. ABG reveled hypoxic and hypercapnic respiratory failure requiring intubation. Patient was also spiking fevers and started on broad spectrum antibiotic for possible sepsis.   Objective   Blood pressure (!) 101/49, pulse 92, temperature (!) 100.5 F (38.1 C), temperature source Axillary, resp. rate (!) 24, height 6\' 2"  (1.88 m), weight 131 kg, SpO2 97 %.    Vent Mode: PRVC FiO2 (%):  [30 %-100 %] 30 % Set Rate:  [22 bmp-24 bmp] 24 bmp Vt Set:  [600 mL] 600 mL PEEP:  [5 cmH20] 5 cmH20 Plateau Pressure:  [25 cmH20-28 cmH20] 28 cmH20   Intake/Output Summary (Last 24 hours) at 05/21/2020 0739 Last data filed at 05/21/2020 0500 Gross per 24 hour  Intake 1130.63 ml  Output 50 ml  Net 1080.63 ml   Filed Weights   05/16/20 1635 05/16/20 1900 05/21/20 0500  Weight: 132 kg 132 kg 131 kg    Examination: General: obese male, intubated  HENT: large neck , trache midline  Lungs: BL breath sounds , no wheeze  Cardiovascular: RRR, s1 s2  Abdomen: soft, nt nd  Extremities: BL edema  Neuro: Sedated, pupils equal and reactive   GU: defered  Na 128 K 5.4 Cr 9.46 Mg 2.5 CBC 9.4 Resolved Hospital Problem list   Postop hypotension, s/p nephrectomy for renal cell cancer  Assessment & Plan:  Acute hypoxic and hypercapnic respiratory failure  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 Acute metabolic encephalopathy Uremic encephalopathy  Triple lumen HD cath place by IR yesterday. Having difficulty pulling this  morning.  Patient +11L . Overnight became hypoxic and hypercapnic requiring intubation likely from volume overload.  Plan: - planning for HD   Anemia of chronic disease  Iron studies on this admission reflect anemia of chronic disease. Plan to start ESA per nephrology. Transfused 1 unit overnight for Hgb < 7. No signs of active bleeding.  - transfuse for Hgb less than 7 or signs of active bleeding - Per nephrology   Possible sepsis Fever overnight. WBC nl this  am. Blood cultures ending , respiratory cultures pending. - Continue Vanc/Cefepime - Trend CBC, fever - Follow up culture data   Type 2 diabetes At goal this am - CBGS with SSI   Heart failure with preserved ejection fraction - he will need volume removal with HD as above  - This will help improve this problem   Renal cell cancer - s/p nephrectomy   Past history of smoking - scheduled BDs    Best practice:  Diet: Renal diet Pain/Anxiety/Delirium protocol (if indicated): prn oxy  VAP protocol (if indicated): Not indicated DVT prophylaxis: SCD GI prophylaxis: pepcid Glucose control: monitor Mobility: bedrest Code Status: full Family Communication: Will update family today   Disposition: ICU  Labs    CBC: Recent Labs  Lab 05/18/20 0240 05/18/20 2002 05/19/20 0722 05/19/20 0722 05/20/20 0324 05/20/20 0324 05/20/20 1643 05/20/20 2027 05/20/20 2259 05/20/20 2308 05/21/20 0302  WBC 20.2*  --  18.6*  --  15.0*  --   --   --   --   --  9.4  NEUTROABS 16.9*  --  15.9*  --  12.4*  --   --   --   --   --   --   HGB 7.6*   < > 7.0*   < > 7.0*   < > 7.5* 7.1* 6.8* 6.8* 6.7*  HCT 24.5*   < > 22.9*   < > 22.5*   < > 22.0* 21.0* 20.0* 20.0* 22.4*  MCV 91.8  --  92.7  --  94.5  --   --   --   --   --  94.1  PLT 141*  --  124*  --  145*  --   --   --   --   --  163   < > = values in this interval not displayed.    Basic Metabolic Panel: Recent Labs  Lab 05/18/20 2002 05/18/20 2002 05/19/20 1194 05/19/20 0722 05/20/20 0324 05/20/20 0324 05/20/20 1144 05/20/20 1144 05/20/20 1643 05/20/20 2027 05/20/20 2259 05/20/20 2308 05/21/20 0301  NA 132*   < > 129*   < > 129*   < > 130*   < > 128* 127* 127* 128* 128*  K 5.7*   < > 5.4*   < > 6.1*   < > 6.0*   < > 5.9* 6.2* 6.0* 5.8* 5.4*  CL 98  --  96*  --  96*  --  94*  --   --   --   --   --  96*  CO2 25  --  24  --  22  --  24  --   --   --   --   --  21*  GLUCOSE 160*  --  180*  --  115*  --  197*  --   --   --   --    --  84  BUN 74*  --  81*  --  96*  --  103*  --   --   --   --   --  110*  CREATININE 6.66*  --  7.44*  --  8.84*  --  9.41*  --   --   --   --   --  9.46*  CALCIUM 8.0*  --  8.0*  --  8.2*  --  8.3*  --   --   --   --   --  8.0*  MG  --   --  2.3  --  2.7*  --   --   --   --   --   --   --  2.5*  PHOS  --   --   --   --   --   --   --   --   --   --   --   --  5.8*   < > = values in this interval not displayed.   GFR: Estimated Creatinine Clearance: 10.2 mL/min (A) (by C-G formula based on SCr of 9.46 mg/dL (H)). Recent Labs  Lab 05/18/20 0240 05/19/20 0722 05/20/20 0324 05/21/20 0302  WBC 20.2* 18.6* 15.0* 9.4    Liver Function Tests: Recent Labs  Lab 05/18/20 2002 05/21/20 0301  AST 25  --   ALT 8  --   ALKPHOS 42  --   BILITOT 0.7  --   PROT 5.4*  --   ALBUMIN 3.1* 2.7*   No results for input(s): LIPASE, AMYLASE in the last 168 hours. No results for input(s): AMMONIA in the last 168 hours.  ABG    Component Value Date/Time   PHART 7.311 (L) 05/20/2020 2308   PCO2ART 50.4 (H) 05/20/2020 2308   PO2ART 428 (H) 05/20/2020 2308   HCO3 25.3 05/20/2020 2308   TCO2 27 05/20/2020 2308   ACIDBASEDEF 1.0 05/20/2020 2308   O2SAT 100.0 05/20/2020 2308     Coagulation Profile: No results for input(s): INR, PROTIME in the last 168 hours.  Cardiac Enzymes: No results for input(s): CKTOTAL, CKMB, CKMBINDEX, TROPONINI in the last 168 hours.  HbA1C: Hgb A1c MFr Bld  Date/Time Value Ref Range Status  05/13/2020 08:45 AM 6.2 (H) 4.8 - 5.6 % Final    Comment:    (NOTE) Pre diabetes:          5.7%-6.4%  Diabetes:              >6.4%  Glycemic control for   <7.0% adults with diabetes   11/11/2017 03:03 PM 5.8 (H) 4.8 - 5.6 % Final    Comment:    (NOTE) Pre diabetes:          5.7%-6.4% Diabetes:              >6.4% Glycemic control for   <7.0% adults with diabetes     CBG: Recent Labs  Lab 05/20/20 0720 05/20/20 1113 05/20/20 1531 05/20/20 2312 05/21/20 0718   GLUCAP 125* 188* 288* 102* 64*   Tamsen Snider, MD PGY2 Internal Medicine      PCCM:     74 yo kidney cancer s/p nephrectomy, encephalopathic  Patient did not receive HD yesterday after his line placement  He became more obtunded, hypoventilatory, developed CO2 retention, inability to protect air way, acute hypercarbic respiratory failure and ultimately intubated last night.  CXR with new RLL PNA likely related to aspiration from altered mental status secondary.    BP (!) 116/56   Pulse 92   Temp (!) 100.5 F (  38.1 C) (Axillary)   Resp (!) 21   Ht 6\' 2"  (1.88 m)   Wt 131 kg   SpO2 96%   BMI 37.08 kg/m   Gen: obese male, intubated  HENT: ett in place  Heart: RRR s1 s2 Lungs: bl vented breaths   Labs: reviewed   A:  Acute hypoxemic hypercarbic respiratory failure from Acute metabolic encephalopathy, respiratory depression, AMS, hypoventilation, Co2 retention, likely OSA/OHGS baseline, failed BIPAP  Uremia, Acute on Chronic renal failure  Concern for aspiration into airway  Shock, multifactorial, on pressors   P: Needs HD today or CVVHD today  Work on correcting catheter position as there are issues with pulling from it  Case discussed with nephrology  Wean pressors for map >65  Continue abx  Follow up cultures   This patient is critically ill with multiple organ system failure; which, requires frequent high complexity decision making, assessment, support, evaluation, and titration of therapies. This was completed through the application of advanced monitoring technologies and extensive interpretation of multiple databases. During this encounter critical care time was devoted to patient care services described in this note for 32 minutes.  Garner Nash, DO [facility] 05/21/2020 9:44 AM

## 2020-05-21 NOTE — Progress Notes (Signed)
CXR verified by Dr. Tamala Julian. ETT is in good position. RT will continue to monitor.

## 2020-05-21 NOTE — Progress Notes (Signed)
eLink Physician-Brief Progress Note Patient Name: Todd Robertson DOB: [birth year] MRN: [id number]   Date of Service  05/21/2020  HPI/Events of Note  Agitation - Nursing request for PRN sedation.   eICU Interventions  Plan: 1. Fentanyl 25-100 mcg IV Q 2 hours PRN pain or sedation.     Intervention Category Major Interventions: Delirium, psychosis, severe agitation - evaluation and management  [name] 05/21/2020, 12:19 AM

## 2020-05-21 NOTE — Progress Notes (Signed)
Initial Nutrition Assessment  DOCUMENTATION CODES:   Obesity unspecified  INTERVENTION:   Initiate tube feeding via OG tube: Vital AF 1.2 at 50 ml/h (1200 ml per day) Prosource TF 90 ml QID  Provides 1760 kcal, 178 gm protein, 973 ml free water daily  NUTRITION DIAGNOSIS:   Increased nutrient needs related to acute illness (Renal failure on CRRT) as evidenced by estimated needs.  GOAL:   Provide needs based on ASPEN/SCCM guidelines  MONITOR:   Vent status, TF tolerance, Labs  REASON FOR ASSESSMENT:   Ventilator, Consult Enteral/tube feeding initiation and management  ASSESSMENT:   74 yo male admitted 7/9 S/P right nephrectomy for large mass. Transferred to the ICU and required intubation on 7/13 due to shock and worsening respiratory failure. PMH includes HTN, cardiomyopathy, DM2, prostate CA, CKD, HLD, gout.   Prior to intubation, patient was on a Renal/CHO modified diet with poor intake (0-20% meal intakes recorded). HD catheter replaced this morning. CRRT being initiated today. Spoke with MD regarding initiation of TF; okay for RD to order TF today.   Patient is currently intubated on ventilator support MV: 13.9 L/min Temp (24hrs), Avg:99.6 F (37.6 C), Min:98.7 F (37.1 C), Max:101.2 F (38.4 C)   Labs reviewed. Na 128, K 5.4, BUN 110, Creat 9.46, phos 5.8, mag 2.5 CBG: 64-97-100  Medications reviewed and include colace, novolog, phenylephrine.  Admission weight 126.6 kg Currently 131 kg with volume overload. I/O +11.5 L since admission  NUTRITION - FOCUSED PHYSICAL EXAM:  unable to complete  Diet Order:   Diet Order            Diet NPO time specified  Diet effective now                 EDUCATION NEEDS:   Not appropriate for education at this time  Skin:  Skin Assessment: Reviewed RN Assessment  Last BM:  7/12  Height:   Ht Readings from Last 1 Encounters:  05/16/20 6\' 2"  (1.88 m)    Weight:   Wt Readings from Last 1  Encounters:  05/21/20 131 kg    Ideal Body Weight:  86.4 kg  BMI:  Body mass index is 37.08 kg/m.  Estimated Nutritional Needs:   Kcal:  1600-1850  Protein:  >/= 173 gm  Fluid:  1 L + UOP    Lucas Mallow, RD, LDN, CNSC Please refer to Amion for contact information.

## 2020-05-21 NOTE — Progress Notes (Signed)
[facility] Progress Note  Subjective: progressive decline in MS yest PM ultimately intubated; febrile started broad spectrum antibiotics.  Overnight temp HD catheter not functional, replaced this AM and now working ok.  CRRT started about 11:30am.  Discussed events with daughter and wife bedside ~noon.  Vitals:   05/21/20 1200 05/21/20 1230 05/21/20 1245 05/21/20 1300  BP: (!) 105/58 (!) 98/53 (!) 85/56 93/60  Pulse: 81 82 80 79  Resp: (!) 21 (!) 24 (!) 24 (!) 24  Temp:      TempSrc:      SpO2: 99% 95% 97% 98%  Weight:      Height:        Exam: Gen large-framed AAM intubated and sedated No jvd or bruits Chest clear ant, dec BS bases RRR no MRG Abd marked abd obesity, lap wounds in R abdomen, distended, nontender MS no joint effusions or deformity Ext 1+ bilat UE edema, no sig LE edema Neuro sedated    Home meds:  - asa 60m/ lipitor 40 hs/ benazepril 20 am/ bumex 19mqd/ coreg 25 bid  - allopurinol 300 hs  - symbicort ACT 2 puff bid  - prn's/ vitamins/ supplements  Date                             Creat               egFR   2018                          1.79- 2.38        30- 43   2019                          1.80- 1.81        42-43   January 12, 2020           3.00                    May 13, 2020                2.60                 27   Jul 9                           2.51                 29- 33   Jul 10                         3.42, 3.89    UA none yet   CT abd wo contrast 7/9 - Adrenals/Urinary Tract: Post right nephrectomy. Large volume of hemorrhage in the right nephrectomy bed. Ill-defined air fluid collection tracks in the right retroperitoneum anterior to the iliopsoas muscle and laterally in the abdominal cavity. Hemorrhage tracks into the right aspect of the pelvis in the is only partially included in the field of view. There is moderate subcutaneous emphysema involving the right abdominal wall. Small foci of air dissects between the right  abdominal wall musculature. Simple cyst in the left kidney. There is no left hydronephrosis.     Assessment/ Plan: 1. AoCKD IV - b/l creat 2.60, eGFR 27 then s/p nephrectomy with resultant progressive renal  insufficiency and oliguria.  HD catheter placed IR 7/13 - not functional so replaced bedside today.  Now running CRRT in setting of septic shock.  BID labs.  Have discussed with pt and family yesterday likely outcome will be long term HD. 2. Renal mass - sp R nephrectomy 05/16/20 3. Anemia ckd / abl - Hb down 12 > 10 > 8.1> 7.6> 7.0>mid 6s now.  CT 7/9 with hemorrhage in nephrectomy bed - consider repeat.   Transfuse per primary. Iron indices show tsat 10%.  Hold IV iron in setting of septic shock.  Start ESA during admission once confirm malignancy was curative surgery.  4. Shock, presumed septic - on pressors and broad spectrum abx per primary. 5. Somnolence - multifactorial, CO2, uremia  6. Hyponatremia - hypervolemic, secondary to #1.  Will correct with CRRT. 7. Hyperkalemia - K 5.4 this AM.  Will correct with CRRT.    Jannifer Hick MD [facility] Pager [phone]   Recent Labs  Lab 05/20/20 1144 05/20/20 1643 05/20/20 2308 05/21/20 0301 05/21/20 0302  K 6.0*   < > 5.8* 5.4*  --   BUN 103*  --   --  110*  --   CREATININE 9.41*  --   --  9.46*  --   CALCIUM 8.3*  --   --  8.0*  --   PHOS  --   --   --  5.8*  --   HGB  --    < > 6.8*  --  6.7*   < > = values in this interval not displayed.   Inpatient medications: . [START ON 05/22/2020] aspirin  81 mg Per Tube Daily  . chlorhexidine gluconate (MEDLINE KIT)  15 mL Mouth Rinse BID  . Chlorhexidine Gluconate Cloth  6 each Topical Daily  . dextrose  50 mL Intravenous Once  . docusate  100 mg Per Tube BID  . [START ON 05/22/2020] famotidine  20 mg Per Tube Daily  . feeding supplement (PROSource TF)  45 mL Per Tube QID  . insulin aspart  0-9 Units Subcutaneous TID WC  . mouth rinse  15 mL Mouth Rinse BID  .  mouth rinse  15 mL Mouth Rinse 10 times per day  . mometasone-formoterol  2 puff Inhalation BID  . sodium chloride flush  10-40 mL Intracatheter Q12H  . vancomycin variable dose per unstable renal function (pharmacist dosing)   Does not apply See admin instructions   . ceFEPime (MAXIPIME) IV Stopped (05/21/20 1008)  . feeding supplement (VITAL AF 1.2 CAL)    . fentaNYL infusion INTRAVENOUS 125 mcg/hr (05/21/20 1300)  . heparin 10,000 units/ 20 mL infusion syringe 250 Units/hr (05/21/20 1200)  . midazolam Stopped (05/21/20 1232)  . phenylephrine (NEO-SYNEPHRINE) Adult infusion 180 mcg/min (05/21/20 1300)  . prismasol BGK 2/2.5 dialysis solution 1,500 mL/hr at 05/21/20 1257  . prismasol BGK 2/2.5 replacement solution 500 mL/hr at 05/20/20 2226  . prismasol BGK 2/2.5 replacement solution 300 mL/hr at 05/20/20 2226  . [START ON 05/22/2020] vancomycin     acetaminophen, albuterol, alum & mag hydroxide-simeth, diphenhydrAMINE **OR** diphenhydrAMINE, fentaNYL (SUBLIMAZE) injection, heparin, heparin, menthol-cetylpyridinium, ondansetron, oxyCODONE, promethazine, sodium chloride, sodium chloride flush, tiZANidine

## 2020-05-21 NOTE — Progress Notes (Signed)
eLink Physician-Brief Progress Note Patient Name: Todd Robertson DOB: 1946-04-20 MRN: [id number]   Date of Service  05/21/2020  HPI/Events of Note  Anemia - Hgb = 6.7.   eICU Interventions  Will transfuse 1 unit PRBC.      Intervention Category Major Interventions: Other:  Lysle Dingwall 05/21/2020, 3:44 AM

## 2020-05-21 NOTE — Progress Notes (Signed)
eLink Physician-Brief Progress Note Patient Name: Todd Robertson DOB: 29-Jun-1946 MRN: [id number]   Date of Service  05/21/2020  HPI/Events of Note  Agitation   eICU Interventions  Plan: 1. Increase ceiling on Fentanyl IV infusion to 400 mcg/hour. 2. Versed IV infusion. Titrate to RASS = 0 to -1.      Intervention Category Major Interventions: Delirium, psychosis, severe agitation - evaluation and management  [name] 05/21/2020, 1:03 AM

## 2020-05-21 NOTE — Progress Notes (Signed)
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 Path pending.  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 S/p right nephrectomy 7/9. Initially near anuric POD 0. CT 7/9 PM unremarkable left (solitary) kidney. Continued poor urine output, nephrology consulted, attempted initiation of CRRT yesterday but unable to get line to work overnight, so has not yet received dialysis  3- Acute Blood Loss Anemia - Hgb 8s pos-op (after 3L fluid). No tachycardia. CT with expected post-op changes, no large hematomas. Transfusion x 1u pRBC 7/9, again on 7/11. Hb 6.7 today, planning another transfusion.  4 - Uremic encephalopathy- Patient increasingly obtunded yesterday afternoon, with worsening hypoxia and hypercarbia, and was subsequently intubated.   5 - Fever - Patient febrile to 102 yesterday, blood and bronchial cultures collected, pending. CXR with RLL consolidation, diffuse opacities.    Objective: Vital signs in last 24 hours: Temp:  [98.6 F (37 C)-101.2 F (38.4 C)] 100.5 F (38.1 C) (07/14 0721) Pulse Rate:  [77-96] 92 (07/14 0815) Resp:  [16-29] 21 (07/14 0815) BP: (76-125)/(43-71) 116/56 (07/14 0815) SpO2:  [93 %-100 %] 96 % (07/14 0815) FiO2 (%):  [30 %-100 %] 30 % (07/14 0753) Weight:  [131 kg] 131 kg (07/14 0500) Last BM Date: 05/19/20  Intake/Output from previous day: 07/13 0701 - 07/14 0700 In: 1429.4 [P.O.:60; I.V.:769.4; IV Piggyback:599.9] Out: 50 [Urine:50] Intake/Output this shift: Total I/O In: 64.4 [I.V.:64.4] Out: -   General appearance: Intubated, sedated Resp: mechanically ventilated Cardio: HR 70s on monitor, regular.  GU: foley in place with clear yellow urine Extremities: diffusely edematous Skin: Skin color, texture, turgor normal. No rashes or lesions  Abd: Obese, distended, tympantic. Large right flank ecchymosis.   Lab Results:   Recent Labs    05/20/20 0324 05/20/20 1643 05/20/20 2308 05/21/20 0302  WBC 15.0*  --   --  9.4  HGB 7.0*   < > 6.8* 6.7*  HCT 22.5*   < > 20.0* 22.4*  PLT 145*  --   --  163   < > = values in this interval not displayed.   BMET Recent Labs    05/20/20 1144 05/20/20 1643 05/20/20 2308 05/21/20 0301  NA 130*   < > 128* 128*  K 6.0*   < > 5.8* 5.4*  CL 94*  --   --  96*  CO2 24  --   --  21*  GLUCOSE 197*  --   --  84  BUN 103*  --   --  110*  CREATININE 9.41*  --   --  9.46*  CALCIUM 8.3*  --   --  8.0*   < > = values in this interval not displayed.   PT/INR No results for input(s): LABPROT, INR in the last 72 hours. ABG Recent Labs    05/20/20 2259 05/20/20 2308  PHART 7.282* 7.311*  HCO3 23.1 25.3    Studies/Results: IR Fluoro Guide CV Line Right  Result Date: 05/20/2020 INDICATION: 74 year old male with renal failure EXAM: IMAGE GUIDED PLACEMENT OF TEMPORARY HEMODIALYSIS CATHETER MEDICATIONS: None ANESTHESIA/SEDATION: None FLUOROSCOPY TIME:  Fluoroscopy Time: 0 minutes 6 seconds (1 mGy). COMPLICATIONS: None PROCEDURE: Informed written consent was obtained from the patient's family after a discussion of the risks, benefits, and alternatives to treatment. Questions regarding the procedure were encouraged and answered. The right neck was prepped with chlorhexidine in a sterile  fashion, and a sterile drape was applied covering the operative field. Maximum barrier sterile technique with sterile gowns and gloves were used for the procedure. A timeout was performed prior to the initiation of the procedure. A micropuncture kit was utilized to access the right internal jugular vein under direct, real-time ultrasound guidance after the overlying soft tissues were anesthetized with 1% lidocaine with epinephrine. Ultrasound image documentation was performed. The microwire was kinked to measure appropriate catheter length. A stiff glidewire was advanced to the level of the IVC. A 16  cm hemodialysis catheter was then placed over the wire. Final catheter positioning was confirmed and documented with a spot radiographic image. The catheter aspirates and flushes normally. The catheter was flushed with appropriate volume heparin dwells. Dressings were applied. The patient tolerated the procedure well without immediate post procedural complication. IMPRESSION: Status post temporary right IJ hemodialysis catheter. Signed, Dulcy Fanny. Dellia Nims, RPVI [facility] Electronically Signed   By: Corrie Mckusick D.O.   On: 05/20/2020 12:48   IR US Guide Vasc Access Right  Result Date: 05/20/2020 INDICATION: 74 year old male with renal failure EXAM: IMAGE GUIDED PLACEMENT OF TEMPORARY HEMODIALYSIS CATHETER MEDICATIONS: None ANESTHESIA/SEDATION: None FLUOROSCOPY TIME:  Fluoroscopy Time: 0 minutes 6 seconds (1 mGy). COMPLICATIONS: None PROCEDURE: Informed written consent was obtained from the patient's family after a discussion of the risks, benefits, and alternatives to treatment. Questions regarding the procedure were encouraged and answered. The right neck was prepped with chlorhexidine in a sterile fashion, and a sterile drape was applied covering the operative field. Maximum barrier sterile technique with sterile gowns and gloves were used for the procedure. A timeout was performed prior to the initiation of the procedure. A micropuncture kit was utilized to access the right internal jugular vein under direct, real-time ultrasound guidance after the overlying soft tissues were anesthetized with 1% lidocaine with epinephrine. Ultrasound image documentation was performed. The microwire was kinked to measure appropriate catheter length. A stiff glidewire was advanced to the level of the IVC. A 16 cm hemodialysis catheter was then placed over the wire. Final catheter positioning was confirmed and documented with a spot radiographic image. The catheter  aspirates and flushes normally. The catheter was flushed with appropriate volume heparin dwells. Dressings were applied. The patient tolerated the procedure well without immediate post procedural complication. IMPRESSION: Status post temporary right IJ hemodialysis catheter. Signed, Dulcy Fanny. Dellia Nims, RPVI [facility] Electronically Signed   By: Corrie Mckusick D.O.   On: 05/20/2020 12:48   DG CHEST PORT 1 VIEW  Result Date: 05/21/2020 CLINICAL DATA:  Respiratory distress. EXAM: PORTABLE CHEST 1 VIEW COMPARISON:  05/20/2020 FINDINGS: The endotracheal tube is 8 cm above the carina. The NG tube is coursing down the esophagus and into the stomach. The right IJ central venous catheter is stable. The cardiac silhouette, mediastinal and hilar contours are within normal limits and unchanged. Significant worsening lung aeration with bilateral infiltrates and suspected small effusions. IMPRESSION: 1. Stable support apparatus. 2. Significant worsening lung aeration with bilateral infiltrates and suspected small effusions. Electronically Signed   By: Marijo Sanes M.D.   On: 05/21/2020 06:50   DG CHEST PORT 1 VIEW  Result Date: 05/20/2020 CLINICAL DATA:  Endotracheal tube and NG tube placement. EXAM: PORTABLE CHEST 1 VIEW COMPARISON:  Chest radiograph yesterday. FINDINGS: The endotracheal tube is partially obscured by overlying enteric tube, however tip appears to be at the level of the clavicular heads,  unchanged. Enteric tube in place with tip and side-port below the diaphragm. Right internal jugular dialysis catheter tip projects over the mid SVC. No pneumothorax. Small bilateral pleural effusions and adjacent atelectasis, more prominent on the left. Unchanged heart size and mediastinal contours. No pulmonary edema. IMPRESSION: 1. Endotracheal tube partially obscured by overlying enteric tube, however tip appears to be at the level of the clavicular heads.  2. Small bilateral pleural effusions and adjacent atelectasis, more prominent on the left. Electronically Signed   By: Keith Rake M.D.   On: 05/20/2020 21:37   DG CHEST PORT 1 VIEW  Result Date: 05/20/2020 CLINICAL DATA:  74 year old male ETT advanced. EXAM: PORTABLE CHEST 1 VIEW COMPARISON:  Portable chest 2101 hours today. FINDINGS: Portable AP semi upright view at 2110 hours. Endotracheal tube tip now just below the level the clavicles. Stable right IJ double-lumen catheter. Enteric tube courses to the abdomen. Stable cardiac size and mediastinal contours. Evidence of small bilateral pleural effusions. No pneumothorax or pulmonary edema. Patchy right perihilar opacity is stable. IMPRESSION: 1. Endotracheal tube tip now just below the level the clavicles. 2. Otherwise stable lines and tubes. 3. Stable ventilation with small bilateral pleural effusions and patchy right perihilar opacity. Electronically Signed   By: Genevie Ann M.D.   On: 05/20/2020 21:36   DG CHEST PORT 1 VIEW  Result Date: 05/19/2020 CLINICAL DATA:  Shortness of breath.  Acute kidney injury. EXAM: PORTABLE CHEST 1 VIEW COMPARISON:  Single-view of the chest 05/18/2020. CT abdomen and pelvis 05/16/2020. FINDINGS: Small bilateral pleural effusions and basilar atelectasis are greater on the right. Pleural effusion on the right is slightly increased. Heart size is normal. No pneumothorax. No acute bony abnormality. IMPRESSION: Small bilateral pleural effusions and basilar atelectasis. Effusion on the right has slightly increased since the prior chest film. Electronically Signed   By: Inge Rise M.D.   On: 05/19/2020 13:07   DG Abd Portable 1V  Result Date: 05/20/2020 CLINICAL DATA:  74 year old male NG tube placed. EXAM: PORTABLE ABDOMEN - 1 VIEW COMPARISON:  Portable chest from the same time. CT Abdomen and Pelvis 05/16/2020 FINDINGS: Portable AP view at 2113 hours. Enteric tube placed into the stomach with side hole at the level  of the gastric fundus. Underlying moderate gas distended stomach, probably mildly progressed from the CT 4 days ago. Other visible gas-filled bowel. IMPRESSION: 1. Enteric tube placed into the stomach, side hole at the level of the gastric fundus. 2. Distended stomach, recommend enteric tube to suction. Electronically Signed   By: Genevie Ann M.D.   On: 05/20/2020 21:37    Anti-infectives: Anti-infectives (From admission, onward)   Start     Dose/Rate Route Frequency Ordered Stop   05/22/20 0400  vancomycin (VANCOREADY) IVPB 1250 mg/250 mL     Discontinue     1,250 mg 166.7 mL/hr over 90 Minutes Intravenous Every 24 hours 05/21/20 0218     05/21/20 0315  vancomycin (VANCOCIN) 2,500 mg in sodium chloride 0.9 % 500 mL IVPB        2,500 mg 250 mL/hr over 120 Minutes Intravenous  Once 05/21/20 0218 05/21/20 0540   05/21/20 0315  ceFEPIme (MAXIPIME) 2 g in sodium chloride 0.9 % 100 mL IVPB     Discontinue     2 g 200 mL/hr over 30 Minutes Intravenous Every 12 hours 05/21/20 0218     05/16/20 1700  ceFAZolin (ANCEF) IVPB 1 g/50 mL premix        1 g  100 mL/hr over 30 Minutes Intravenous Every 8 hours 05/16/20 1600 05/17/20 0206   05/16/20 0600  ceFAZolin (ANCEF) 3 g in dextrose 5 % 50 mL IVPB        3 g 100 mL/hr over 30 Minutes Intravenous 30 min pre-op 05/15/20 0725 05/16/20 1944      Assessment/Plan:  1 - RIGHT Renal Neoplasm - no further cancer directed therapy this admission.   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 CRRT attempted last night but difficulty with line function. Nephrology, CCM following, agree with acute need for dialysis.  3 - Uremic encephalopathy - secondary to above, strongly agree with urgent initiation of CRRT   4 - Fever / Possible sepsis - agree with ICU workup of fever, blood and bronchial cultures pending. CXR today with diffuse worsening  opacities, particularly RLL.   5- Acute Blood Loss Anemia - Hb drift to 6.7 today, agree with blood transfusion.     Carmie Kanner 05/21/2020

## 2020-05-22 ENCOUNTER — Inpatient Hospital Stay (HOSPITAL_COMMUNITY): Payer: Medicare Other

## 2020-05-22 LAB — PTH, INTACT AND CALCIUM
Calcium, Total (PTH): 8.2 mg/dL — ABNORMAL LOW (ref 8.6–10.2)
PTH: 116 pg/mL — ABNORMAL HIGH (ref 15–65)

## 2020-05-22 LAB — TYPE AND SCREEN
ABO/RH(D): O POS
Antibody Screen: NEGATIVE
Unit division: 0

## 2020-05-22 LAB — RENAL FUNCTION PANEL
Albumin: 2.3 g/dL — ABNORMAL LOW (ref 3.5–5.0)
Albumin: 1.5 g/dL — ABNORMAL LOW (ref 3.5–5.0)
Albumin: 2.4 g/dL — ABNORMAL LOW (ref 3.5–5.0)
Anion gap: 14 (ref 5–15)
Anion gap: 12 (ref 5–15)
Anion gap: 8 (ref 5–15)
BUN: 85 mg/dL — ABNORMAL HIGH (ref 8–23)
BUN: 48 mg/dL — ABNORMAL HIGH (ref 8–23)
BUN: 67 mg/dL — ABNORMAL HIGH (ref 8–23)
CO2: 20 mmol/L — ABNORMAL LOW (ref 22–32)
CO2: 13 mmol/L — ABNORMAL LOW (ref 22–32)
CO2: 21 mmol/L — ABNORMAL LOW (ref 22–32)
Calcium: 8 mg/dL — ABNORMAL LOW (ref 8.9–10.3)
Calcium: 5 mg/dL — CL (ref 8.9–10.3)
Calcium: 7.9 mg/dL — ABNORMAL LOW (ref 8.9–10.3)
Chloride: 98 mmol/L (ref 98–111)
Chloride: 101 mmol/L (ref 98–111)
Chloride: 118 mmol/L — ABNORMAL HIGH (ref 98–111)
Creatinine, Ser: 6.97 mg/dL — ABNORMAL HIGH (ref 0.61–1.24)
Creatinine, Ser: 3.41 mg/dL — ABNORMAL HIGH (ref 0.61–1.24)
Creatinine, Ser: 5.23 mg/dL — ABNORMAL HIGH (ref 0.61–1.24)
GFR calc Af Amer: 8 mL/min — ABNORMAL LOW (ref >60)
GFR calc Af Amer: 12 mL/min — ABNORMAL LOW (ref >60)
GFR calc Af Amer: 20 mL/min — ABNORMAL LOW (ref >60)
GFR calc non Af Amer: 7 mL/min — ABNORMAL LOW (ref >60)
GFR calc non Af Amer: 10 mL/min — ABNORMAL LOW (ref >60)
GFR calc non Af Amer: 17 mL/min — ABNORMAL LOW (ref >60)
Glucose, Bld: 157 mg/dL — ABNORMAL HIGH (ref 70–99)
Glucose, Bld: 142 mg/dL — ABNORMAL HIGH (ref 70–99)
Glucose, Bld: 198 mg/dL — ABNORMAL HIGH (ref 70–99)
Phosphorus: 4.7 mg/dL — ABNORMAL HIGH (ref 2.5–4.6)
Phosphorus: 2.3 mg/dL — ABNORMAL LOW (ref 2.5–4.6)
Phosphorus: 3.7 mg/dL (ref 2.5–4.6)
Potassium: 4.9 mmol/L (ref 3.5–5.1)
Potassium: 2.7 mmol/L — CL (ref 3.5–5.1)
Potassium: 4.3 mmol/L (ref 3.5–5.1)
Sodium: 132 mmol/L — ABNORMAL LOW (ref 135–145)
Sodium: 134 mmol/L — ABNORMAL LOW (ref 135–145)
Sodium: 139 mmol/L (ref 135–145)

## 2020-05-22 LAB — CBC
HCT: 20.9 % — ABNORMAL LOW (ref 39.0–52.0)
HCT: 21 % — ABNORMAL LOW (ref 39.0–52.0)
HCT: 23 % — ABNORMAL LOW (ref 39.0–52.0)
Hemoglobin: 6.6 g/dL — CL (ref 13.0–17.0)
Hemoglobin: 6.8 g/dL — CL (ref 13.0–17.0)
Hemoglobin: 7.4 g/dL — ABNORMAL LOW (ref 13.0–17.0)
MCH: 28.7 pg (ref 26.0–34.0)
MCH: 29.3 pg (ref 26.0–34.0)
MCH: 29.1 pg (ref 26.0–34.0)
MCHC: 31.6 g/dL (ref 30.0–36.0)
MCHC: 32.4 g/dL (ref 30.0–36.0)
MCHC: 32.2 g/dL (ref 30.0–36.0)
MCV: 90.9 fL (ref 80.0–100.0)
MCV: 90.5 fL (ref 80.0–100.0)
MCV: 90.6 fL (ref 80.0–100.0)
Platelets: 165 10*3/uL (ref 150–400)
Platelets: 143 10*3/uL — ABNORMAL LOW (ref 150–400)
Platelets: 159 10*3/uL (ref 150–400)
RBC: 2.3 MIL/uL — ABNORMAL LOW (ref 4.22–5.81)
RBC: 2.32 MIL/uL — ABNORMAL LOW (ref 4.22–5.81)
RBC: 2.54 MIL/uL — ABNORMAL LOW (ref 4.22–5.81)
RDW: 15.9 % — ABNORMAL HIGH (ref 11.5–15.5)
RDW: 15.7 % — ABNORMAL HIGH (ref 11.5–15.5)
RDW: 15.9 % — ABNORMAL HIGH (ref 11.5–15.5)
WBC: 13.2 10*3/uL — ABNORMAL HIGH (ref 4.0–10.5)
WBC: 13.6 10*3/uL — ABNORMAL HIGH (ref 4.0–10.5)
WBC: 13.5 10*3/uL — ABNORMAL HIGH (ref 4.0–10.5)
nRBC: 1.5 % — ABNORMAL HIGH (ref 0.0–0.2)
nRBC: 0.3 % — ABNORMAL HIGH (ref 0.0–0.2)
nRBC: 0.3 % — ABNORMAL HIGH (ref 0.0–0.2)

## 2020-05-22 LAB — POCT ACTIVATED CLOTTING TIME
Activated Clotting Time: 164 seconds
Activated Clotting Time: 164 seconds
Activated Clotting Time: 164 seconds
Activated Clotting Time: 169 seconds
Activated Clotting Time: 169 seconds
Activated Clotting Time: 175 seconds
Activated Clotting Time: 158 seconds
Activated Clotting Time: 164 seconds
Activated Clotting Time: 175 seconds
Activated Clotting Time: 175 seconds
Activated Clotting Time: 180 seconds
Activated Clotting Time: 186 seconds

## 2020-05-22 LAB — MAGNESIUM: Magnesium: 2.5 mg/dL — ABNORMAL HIGH (ref 1.7–2.4)

## 2020-05-22 LAB — BASIC METABOLIC PANEL
Anion gap: 11 (ref 5–15)
BUN: 81 mg/dL — ABNORMAL HIGH (ref 8–23)
CO2: 21 mmol/L — ABNORMAL LOW (ref 22–32)
Calcium: 7.9 mg/dL — ABNORMAL LOW (ref 8.9–10.3)
Chloride: 98 mmol/L (ref 98–111)
Creatinine, Ser: 6.7 mg/dL — ABNORMAL HIGH (ref 0.61–1.24)
GFR calc Af Amer: 9 mL/min — ABNORMAL LOW (ref >60)
GFR calc non Af Amer: 8 mL/min — ABNORMAL LOW (ref >60)
Glucose, Bld: 170 mg/dL — ABNORMAL HIGH (ref 70–99)
Potassium: 4.5 mmol/L (ref 3.5–5.1)
Sodium: 130 mmol/L — ABNORMAL LOW (ref 135–145)

## 2020-05-22 LAB — GLUCOSE, CAPILLARY
Glucose-Capillary: 150 mg/dL — ABNORMAL HIGH (ref 70–99)
Glucose-Capillary: 159 mg/dL — ABNORMAL HIGH (ref 70–99)
Glucose-Capillary: 199 mg/dL — ABNORMAL HIGH (ref 70–99)
Glucose-Capillary: 181 mg/dL — ABNORMAL HIGH (ref 70–99)
Glucose-Capillary: 174 mg/dL — ABNORMAL HIGH (ref 70–99)
Glucose-Capillary: 163 mg/dL — ABNORMAL HIGH (ref 70–99)

## 2020-05-22 LAB — PREPARE RBC (CROSSMATCH)

## 2020-05-22 LAB — BPAM RBC
Blood Product Expiration Date: 202108122359
ISSUE DATE / TIME: 202107140351
Unit Type and Rh: 5100

## 2020-05-22 LAB — TROPONIN I (HIGH SENSITIVITY)
Troponin I (High Sensitivity): 31 ng/L — ABNORMAL HIGH (ref <18)
Troponin I (High Sensitivity): 36 ng/L — ABNORMAL HIGH (ref <18)

## 2020-05-22 LAB — APTT: aPTT: 151 seconds — ABNORMAL HIGH (ref 24–36)

## 2020-05-22 MED ORDER — VASOPRESSIN 20 UNITS/100 ML INFUSION FOR SHOCK
0.0000 [IU]/min | INTRAVENOUS | Status: DC
  Administered 2020-05-22 – 2020-05-23 (×4): 0.03 [IU]/min via INTRAVENOUS
  Filled 2020-05-22 (×5): qty 100

## 2020-05-22 MED ORDER — ALBUMIN HUMAN 25 % IV SOLN
12.5000 g | Freq: Once | INTRAVENOUS | Status: AC
  Administered 2020-05-22: 12.5 g via INTRAVENOUS
  Filled 2020-05-22: qty 50

## 2020-05-22 MED ORDER — STERILE WATER FOR INJECTION IJ SOLN
INTRAMUSCULAR | Status: AC
  Administered 2020-05-22: 20 mL
  Filled 2020-05-22: qty 20

## 2020-05-22 MED ORDER — NOREPINEPHRINE 16 MG/250ML-% IV SOLN
0.0000 ug/min | INTRAVENOUS | Status: DC
  Administered 2020-05-22: 15 ug/min via INTRAVENOUS
  Filled 2020-05-22: qty 250

## 2020-05-22 MED ORDER — SODIUM CHLORIDE 0.9% IV SOLUTION: Freq: Once | INTRAVENOUS | Status: AC

## 2020-05-22 MED ORDER — NOREPINEPHRINE 4 MG/250ML-% IV SOLN: 0.0000 ug/min | INTRAVENOUS | Status: DC

## 2020-05-22 MED FILL — Sodium Chloride IV Soln 0.9%: INTRAVENOUS | Qty: 250 | Status: AC

## 2020-05-22 MED FILL — Phenylephrine HCl IV Soln 10 MG/ML: INTRAVENOUS | Qty: 10 | Status: AC

## 2020-05-22 NOTE — Progress Notes (Signed)
2 RTs attempted aline unsuccessfully x2. RT notified RN.

## 2020-05-22 NOTE — Progress Notes (Signed)
eLink Physician-Brief Progress Note Patient Name: Todd Robertson DOB: Nov 26, 1945 MRN: [id number]   Date of Service  05/22/2020  HPI/Events of Note  Patient in AFIB with controlled ventricular response - ventricular rate = 80's to 90's. Patient with history of cardiomyopathy. Last LVEF = 55% to 60%. EKG reveals. BP = 67/57 with MAP = 63. EKG reveals Atrial fibrillation. Right bundle branch block. Cannot rule out Inferior infarct , age undetermined  eICU Interventions  Will order: 1. BMP and Mg++ level STAT. 2. Titrate Phenylephrine IV infusion to MAP >= 65. 3. Cycle troponin. 4. Decrease fluid removal on CRRT until BP improved.     Intervention Category Major Interventions: Arrhythmia - evaluation and management  Daved Mcfann Eugene 05/22/2020, 1:03 AM

## 2020-05-22 NOTE — Progress Notes (Signed)
Assisted with transport to CT and back to patient room. No noted respiratory issues at this time.

## 2020-05-22 NOTE — Progress Notes (Addendum)
NAME:  ISMEAL HEIDER, MRN:  [id number], DOB:  12/30/1945, LOS: 6 ADMISSION DATE:  05/16/2020, CONSULTATION DATE:  05/16/2020 REFERRING MD:  Dr Alyson Ingles, CHIEF COMPLAINT:  Shock   Brief History    74 year old male w/ hx as outlined below. Presented 7/9 for planned right Nephrectomy. Went to the OR that day. EBL estimated at 380ml. Intraoperative anesthesia note unremarkable. While in the PACU had declining w/ systolic BPs in 17O. Placed in Trendelenburg; IVF boluses administered (3 liters crystalloid and 250 albumin). BP normalized but only made 31ml of urine.  Because of this PCCM was asked to assess for circulatory shock and concern about hemodynamics  Past Medical History   Past Medical History:  Diagnosis Date  . Arthritis   . Cancer [facility])    prostate  . Cardiomyopathy    secondary  . CKD (chronic kidney disease)   . DM2 (diabetes mellitus, type 2) ([place])   . Gout   . HTN (hypertension)    unspec  . Hypercholesterolemia   . Nonischemic cardiomyopathy ([place])    a. EF 40-50% by most recent 2-D echo b. EF 25% by cardiac cath in 2004  . Overweight(278.02)     Significant Hospital Events   05/16/2020-right nephrectomy  PCCM asked to see post-op for post-operative hypotension. Baseline hgb: 12.3; post-op hgb 10.7 (in PACU). EBL intra-op 388ml.  7/13 Intubated and started on broad spectrum antibiotic for sepsis  Consults:  pccm  Procedures:  Robotic-right nephrectomy for papillary RCC   Significant Diagnostic Tests:   CT abdomen 7/9 IMPRESSION: 1. Post right nephrectomy. Large volume of right retroperitoneal hemorrhage with heterogeneous air and fluid which tracks into the pelvis and right upper quadrant. 2. Moderate subcutaneous emphysema involving the right abdominal wall. 3. Small right pleural effusion. Small pericardial effusion.  Micro Data:  7/13 Blood cultures 7/13 Tracheal aspirate    Antimicrobials:  7/9-cefazolin 7/13  Cefepime > 7/13  Vancomycin >    Interim history/subjective:  Patient with soft BP overnight, A line place this morning. Hgb 6.7 , and transfused this morning. On CRRT.   Objective   Blood pressure (!) 85/50, pulse 82, temperature 99.3 F (37.4 C), temperature source Oral, resp. rate (!) 24, height 6\' 2"  (1.88 m), weight 133 kg, SpO2 100 %.    Vent Mode: PRVC FiO2 (%):  [30 %] 30 % Set Rate:  [24 bmp] 24 bmp Vt Set:  [600 mL] 600 mL PEEP:  [5 cmH20] 5 cmH20 Plateau Pressure:  [20 cmH20-31 cmH20] 23 cmH20   Intake/Output Summary (Last 24 hours) at 05/22/2020 0851 Last data filed at 05/22/2020 0600 Gross per 24 hour  Intake 2007.13 ml  Output 2601 ml  Net -593.87 ml   Filed Weights   05/16/20 1900 05/21/20 0500 05/22/20 0500  Weight: 132 kg 131 kg 133 kg    Examination: General: obese male, intubated  HENT: large neck , trache midline  Lungs: BL breath sounds , no wheeze  Cardiovascular: RRR, s1 s2  Abdomen: soft, nt nd  Extremities: BL edema  Neuro: Sedated, pupils equal and reactive   Skin: Large right hematoma , marked by RN 7/15 GU: Condom Cath   Na 132 K 4.9 CO2 20 Cr 6.97 Mg 2.5 CBC 9.4 Resolved Hospital Problem list     Assessment & Plan:  Acute hypoxic and hypercapnic respiratory failure  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 Acute metabolic encephalopathy Uremic encephalopathy  Patient on CRRT. BUN is starting to downtrend.  Continues on pressor support, with plans to start Levo and stop phenylephrine. On Fentanyl and Versed for sedation.  Plan: - CRRT - Maintain full vent support with SAT/SBT as tolerated - titrate Vent setting to maintain SpO2 greater than or equal to 90%. - HOB elevated 30 degrees. - Plateau pressures less than 30 cm H20.  - Follow chest x-ray, ABG prn.   - Bronchial hygiene and RT/bronchodilator protocol. - Levophed quad to maintain MAP >65, wean as tolerated  Anemia  Likely represent anemia of chronic disease in setting of some blood loss around  surgery. Iron studies on this admission reflect anemia of chronic disease.  Receive 4 units since 7/9. He does have a large hematoma on right side. Discussed with urology who suspect this is not an acute bleed, but agrees a repeat scan would be prudent.  Plan: - transfuse for Hgb less than 7 or signs of active bleeding - CT abdomen and pelvis wo contrast  - Trend Hgb  Sepsis No fever overnight. WBC trending down this am. Blood cultures pending from 7/14 . Rare budding yeast on tracheal aspirate and represents a nl finding at this level.  - Continue Vanc/Cefepime - Trend CBC, fever - Follow up culture data  Type 2 diabetes At goal this am - CBGS with SSI   Heart failure with preserved ejection fraction - he will need volume removal with HD as above  - This will help improve this problem   Renal cell cancer - s/p nephrectomy   Past history of smoking - scheduled BDs    Best practice:  Diet: Renal diet Pain/Anxiety/Delirium protocol (if indicated): prn oxy  VAP protocol (if indicated): Not indicated DVT prophylaxis: SCD GI prophylaxis: pepcid Glucose control: monitor Mobility: bedrest Code Status: full Family Communication: Per primary  Disposition: ICU  Labs    CBC: Recent Labs  Lab 05/18/20 0240 05/18/20 2002 05/19/20 0722 05/19/20 0722 05/20/20 0324 05/20/20 1643 05/20/20 2259 05/20/20 2308 05/21/20 0302 05/21/20 1639 05/22/20 0508  WBC 20.2*  --  18.6*  --  15.0*  --   --   --  9.4 16.9* 13.2*  NEUTROABS 16.9*  --  15.9*  --  12.4*  --   --   --   --   --   --   HGB 7.6*   < > 7.0*   < > 7.0*   < > 6.8* 6.8* 6.7* 8.0* 6.6*  HCT 24.5*   < > 22.9*   < > 22.5*   < > 20.0* 20.0* 22.4* 24.5* 20.9*  MCV 91.8  --  92.7  --  94.5  --   --   --  94.1 91.4 90.9  PLT 141*  --  124*  --  145*  --   --   --  163 167 165   < > = values in this interval not displayed.    Basic Metabolic Panel: Recent Labs  Lab 05/19/20 0722 05/19/20 0722 05/20/20 0324  05/20/20 0324 05/20/20 1144 05/20/20 1144 05/20/20 1643 05/20/20 2308 05/21/20 0301 05/21/20 0336 05/21/20 1639 05/22/20 0103 05/22/20 0258  NA 129*   < > 129*   < > 130*  --    < > 128* 128*  --  130* 130* 132*  K 5.4*   < > 6.1*   < > 6.0*  --    < > 5.8* 5.4*  --  6.2* 4.5 4.9  CL 96*   < > 96*   < >  94*  --   --   --  96*  --  97* 98 98  CO2 24   < > 22   < > 24  --   --   --  21*  --  18* 21* 20*  GLUCOSE 180*   < > 115*   < > 197*  --   --   --  84  --  146* 170* 157*  BUN 81*   < > 96*   < > 103*  --   --   --  110*  --  102* 81* 85*  CREATININE 7.44*   < > 8.84*   < > 9.41*  --   --   --  9.46*  --  8.82* 6.70* 6.97*  CALCIUM 8.0*   < > 8.2*   < > 8.3*   < >  --   --  8.0* 8.2* 8.0* 7.9* 8.0*  MG 2.3  --  2.7*  --   --   --   --   --  2.5*  --   --  2.5*  --   PHOS  --   --   --   --   --   --   --   --  5.8*  --  5.9*  --  4.7*   < > = values in this interval not displayed.   GFR: Estimated Creatinine Clearance: 13.9 mL/min (A) (by C-G formula based on SCr of 6.97 mg/dL (H)). Recent Labs  Lab 05/20/20 0324 05/21/20 0302 05/21/20 1639 05/22/20 0508  WBC 15.0* 9.4 16.9* 13.2*    Liver Function Tests: Recent Labs  Lab 05/18/20 2002 05/21/20 0301 05/21/20 1639 05/22/20 0258  AST 25  --   --   --   ALT 8  --   --   --   ALKPHOS 42  --   --   --   BILITOT 0.7  --   --   --   PROT 5.4*  --   --   --   ALBUMIN 3.1* 2.7* 2.5* 2.3*   No results for input(s): LIPASE, AMYLASE in the last 168 hours. No results for input(s): AMMONIA in the last 168 hours.  ABG    Component Value Date/Time   PHART 7.311 (L) 05/20/2020 2308   PCO2ART 50.4 (H) 05/20/2020 2308   PO2ART 428 (H) 05/20/2020 2308   HCO3 25.3 05/20/2020 2308   TCO2 27 05/20/2020 2308   ACIDBASEDEF 1.0 05/20/2020 2308   O2SAT 100.0 05/20/2020 2308     Coagulation Profile: No results for input(s): INR, PROTIME in the last 168 hours.  Cardiac Enzymes: No results for input(s): CKTOTAL, CKMB,  CKMBINDEX, TROPONINI in the last 168 hours.  HbA1C: Hgb A1c MFr Bld  Date/Time Value Ref Range Status  05/13/2020 08:45 AM 6.2 (H) 4.8 - 5.6 % Final    Comment:    (NOTE) Pre diabetes:          5.7%-6.4%  Diabetes:              >6.4%  Glycemic control for   <7.0% adults with diabetes   11/11/2017 03:03 PM 5.8 (H) 4.8 - 5.6 % Final    Comment:    (NOTE) Pre diabetes:          5.7%-6.4% Diabetes:              >6.4% Glycemic control for   <7.0% adults with diabetes  CBG: Recent Labs  Lab 05/21/20 1509 05/21/20 1908 05/21/20 2331 05/22/20 0320 05/22/20 0847  GLUCAP 165* 152* 158* 150* 159*   Tamsen Snider, MD PGY2 Internal Medicine     PCCM:  74 yo s/p nephrectomy for renal cancer, developed ARF, on CKDIII, uremic encephalopathy, hypercarbic respiratory failure. Ultimately, ended up intubated on life spport and cvvhd. Now with steady drop in hgb. No overt sign of bleeding   BP 116/65   Pulse 70   Temp 99.5 F (37.5 C) (Oral)   Resp (!) 24   Ht 6\' 2"  (1.88 m)   Wt 133 kg   SpO2 100%   BMI 37.65 kg/m   Gen: obese male, intaubted onlife support  HENT: ett in place, right IJ hd cath  Heart: RRR s1 s2 Lungs: bl vented breaths  ABd: distended, right flank bruising   Labs reviewed: Hgb low   A:  Acute hypoxemic and hypercarbic respiratory, co2 retention, likely OSA OHS baseline, with acute metabolic encephalopathy from uremia, leading to hypoventilation  Acute blood loss anemia, multifactorial  AKI on CKD  Shock, multifactorial, sedation related and possible blood loss related Acute on chronic diastolic heart failure  P: PRBCs today  Trend H&H  Wean from pressors if possible  Come of neo Add nepi  Continue vasopressin  Ct abd pelvis to look for any over source of blood loss  Case discussed with urology   This patient is critically ill with multiple organ system failure; which, requires frequent high complexity decision making, assessment, support,  evaluation, and titration of therapies. This was completed through the application of advanced monitoring technologies and extensive interpretation of multiple databases. During this encounter critical care time was devoted to patient care services described in this note for 32 minutes.  Garner Nash, DO [facility] 05/22/2020 2:33 PM

## 2020-05-22 NOTE — Procedures (Addendum)
Arterial Catheter Insertion Procedure Note  Todd Robertson  [id number]  [birth year]  Date:05/22/20  Time:3:28 PM    Provider Performing: Omar Person    Procedure: Insertion of Arterial Line [id number]) with US guidance ([id number])   Indication(s) Blood pressure monitoring and/or need for frequent ABGs  Consent Risks of the procedure as well as the alternatives and risks of each were explained to the patient and/or caregiver.  Consent for the procedure was obtained and is signed in the bedside chart  Anesthesia None   Time Out Verified patient identification, verified procedure, site/side was marked, verified correct patient position, special equipment/implants available, medications/allergies/relevant history reviewed, required imaging and test results available.   Sterile Technique Maximal sterile technique including full sterile barrier drape, hand hygiene, sterile gown, sterile gloves, mask, hair covering, sterile ultrasound probe cover (if used).   Procedure Description Area of catheter insertion was cleaned with chlorhexidine and draped in sterile fashion. With real-time ultrasound guidance an arterial catheter was placed into the left radial artery.  Appropriate arterial tracings confirmed on monitor.     Complications/Tolerance None; patient tolerated the procedure well.   EBL Minimal   Specimen(s) None

## 2020-05-22 NOTE — Progress Notes (Signed)
eLink Physician-Brief Progress Note Patient Name: Todd Robertson DOB: Mar 21, 1946 MRN: [id number]   Date of Service  05/22/2020  HPI/Events of Note  Nursing request for for CBC with AM labs.  eICU Interventions  Will order CBC now.      Intervention Category Major Interventions: Other:  Lysle Dingwall 05/22/2020, 5:07 AM

## 2020-05-22 NOTE — Progress Notes (Addendum)
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 S/p right nephrectomy 7/9. Initially near anuric POD 0. CT 7/9 PM unremarkable left (solitary) kidney. CRRT initiated 7/14, 2.5L removed yesterday   3- Acute Blood Loss Anemia - Hgb 8s pos-op (after 3L fluid). Downtrended to 7, has received 3 u pRBC. Initial CT with expected post-op changes and hematoma in nephrectomy bed. Continued slow drift, Hb 6.6 today after transfusion yesterday. No tachycardia  4 - Uremic encephalopathy- Patient remains intubated and sedated  5 - Fever - Patient febrile to 102 7/43, blood and bronchial cultures collected, pending. CXR with RLL consolidation, diffuse opacities. Blood cultures pending. Pressors started overnight for persistently low BP.   Objective: Vital signs in last 24 hours: Temp:  [97.9 F (36.6 C)-99.8 F (37.7 C)] 99.8 F (37.7 C) (07/15 0911) Pulse Rate:  [47-101] 69 (07/15 0911) Resp:  [21-26] 24 (07/15 0911) BP: (68-130)/(30-102) 127/57 (07/15 0911) SpO2:  [95 %-100 %] 100 % (07/15 0645) FiO2 (%):  [30 %] 30 % (07/15 0739) Weight:  [992 kg] 133 kg (07/15 0500) Last BM Date: 05/19/20  Intake/Output from previous day: 07/14 0701 - 07/15 0700 In: 2071.5 [I.V.:1005.8; EQ/AS:341.9; IV Piggyback:199.9] Out: 2601 [Urine:35] Intake/Output this shift: Total I/O In: -  Out: 368 [Other:368]  General appearance: Intubated, sedated Resp: mechanically ventilated Cardio: HR 70s on monitor, regular.  GU: foley in place with clear yellow urine Extremities: diffusely edematous Skin: Skin color, texture, turgor normal. No rashes or lesions  Abd: Obese, distended, tympantic. Large right flank ecchymosis.   Lab Results:  Recent Labs    05/21/20 1639 05/22/20 0508  WBC 16.9*  13.2*  HGB 8.0* 6.6*  HCT 24.5* 20.9*  PLT 167 165   BMET Recent Labs    05/22/20 0103 05/22/20 0258  NA 130* 132*  K 4.5 4.9  CL 98 98  CO2 21* 20*  GLUCOSE 170* 157*  BUN 81* 85*  CREATININE 6.70* 6.97*  CALCIUM 7.9* 8.0*   PT/INR No results for input(s): LABPROT, INR in the last 72 hours. ABG Recent Labs    05/20/20 2259 05/20/20 2308  PHART 7.282* 7.311*  HCO3 23.1 25.3    Studies/Results: IR Fluoro Guide CV Line Right  Result Date: 05/20/2020 INDICATION: 74 year old male with renal failure EXAM: IMAGE GUIDED PLACEMENT OF TEMPORARY HEMODIALYSIS CATHETER MEDICATIONS: None ANESTHESIA/SEDATION: None FLUOROSCOPY TIME:  Fluoroscopy Time: 0 minutes 6 seconds (1 mGy). COMPLICATIONS: None PROCEDURE: Informed written consent was obtained from the patient's family after a discussion of the risks, benefits, and alternatives to treatment. Questions regarding the procedure were encouraged and answered. The right neck was prepped with chlorhexidine in a sterile fashion, and a sterile drape was applied covering the operative field. Maximum barrier sterile technique with sterile gowns and gloves were used for the procedure. A timeout was performed prior to the initiation of the procedure. A micropuncture kit was utilized to access the right internal jugular vein under direct, real-time ultrasound guidance after the overlying soft tissues were anesthetized with 1% lidocaine with epinephrine. Ultrasound image documentation was performed. The microwire was kinked to measure appropriate catheter length. A stiff glidewire was advanced to the level of the IVC. A 16 cm hemodialysis catheter was then placed over the wire. Final catheter positioning was confirmed and documented  with a spot radiographic image. The catheter aspirates and flushes normally. The catheter was flushed with appropriate volume heparin dwells. Dressings were applied. The patient tolerated the procedure well without immediate post  procedural complication. IMPRESSION: Status post temporary right IJ hemodialysis catheter. Signed, Dulcy Fanny. Dellia Nims, RPVI [facility] Electronically Signed   By: Corrie Mckusick D.O.   On: 05/20/2020 12:48   IR US Guide Vasc Access Right  Result Date: 05/20/2020 INDICATION: 74 year old male with renal failure EXAM: IMAGE GUIDED PLACEMENT OF TEMPORARY HEMODIALYSIS CATHETER MEDICATIONS: None ANESTHESIA/SEDATION: None FLUOROSCOPY TIME:  Fluoroscopy Time: 0 minutes 6 seconds (1 mGy). COMPLICATIONS: None PROCEDURE: Informed written consent was obtained from the patient's family after a discussion of the risks, benefits, and alternatives to treatment. Questions regarding the procedure were encouraged and answered. The right neck was prepped with chlorhexidine in a sterile fashion, and a sterile drape was applied covering the operative field. Maximum barrier sterile technique with sterile gowns and gloves were used for the procedure. A timeout was performed prior to the initiation of the procedure. A micropuncture kit was utilized to access the right internal jugular vein under direct, real-time ultrasound guidance after the overlying soft tissues were anesthetized with 1% lidocaine with epinephrine. Ultrasound image documentation was performed. The microwire was kinked to measure appropriate catheter length. A stiff glidewire was advanced to the level of the IVC. A 16 cm hemodialysis catheter was then placed over the wire. Final catheter positioning was confirmed and documented with a spot radiographic image. The catheter aspirates and flushes normally. The catheter was flushed with appropriate volume heparin dwells. Dressings were applied. The patient tolerated the procedure well without immediate post procedural complication. IMPRESSION: Status post temporary right IJ hemodialysis catheter. Signed, Dulcy Fanny. Dellia Nims, RPVI [facility]  [facility] Electronically Signed   By: Corrie Mckusick D.O.   On: 05/20/2020 12:48   DG Chest Port 1 View  Result Date: 05/21/2020 CLINICAL DATA:  Endotracheal tube placement. EXAM: PORTABLE CHEST 1 VIEW COMPARISON:  Radiographs earlier this day. FINDINGS: The endotracheal tube tip is now 3.4 cm from the carina. Enteric tube in place with tip below the diaphragm not included in the field of view. Right internal jugular central venous catheter tip projects over the SVC, unchanged. Stable heart size and mediastinal contours. Multifocal bilateral pulmonary opacities are not significantly changed from earlier this day. Suspected bilateral pleural effusions. Stable heart size and mediastinal contours. No pneumothorax. IMPRESSION: 1. Endotracheal tube tip now 3.4 cm from the carina. Enteric tube in place with tip below the diaphragm not included in the field of view. 2. Unchanged multifocal bilateral pulmonary opacities, pneumonia versus edema. Probable pleural effusions. Electronically Signed   By: Keith Rake M.D.   On: 05/21/2020 16:56   DG CHEST PORT 1 VIEW  Result Date: 05/21/2020 CLINICAL DATA:  Central line placement EXAM: PORTABLE CHEST 1 VIEW COMPARISON:  05/21/2020 FINDINGS: Right central line catheter tip is again seen in the SVC, unchanged. Upper chest not visualized on this single image. Bilateral lower lobe airspace opacities are again noted, unchanged. NG tube is in the stomach with mild gaseous distention of the stomach. IMPRESSION: Right central line tip is in the SVC, unchanged. Bilateral lower lobe opacities, stable. Distention of the stomach with NG tube in place. Electronically Signed   By: Rolm Baptise M.D.   On: 05/21/2020 11:20   DG CHEST PORT 1 VIEW  Result Date: 05/21/2020 CLINICAL DATA:  Respiratory distress. EXAM: PORTABLE CHEST 1 VIEW COMPARISON:  05/20/2020 FINDINGS: The endotracheal tube is 8 cm above the carina. The NG tube is coursing down the  esophagus and into the stomach. The right IJ central venous catheter is stable. The cardiac silhouette, mediastinal and hilar contours are within normal limits and unchanged. Significant worsening lung aeration with bilateral infiltrates and suspected small effusions. IMPRESSION: 1. Stable support apparatus. 2. Significant worsening lung aeration with bilateral infiltrates and suspected small effusions. Electronically Signed   By: Marijo Sanes M.D.   On: 05/21/2020 06:50   DG CHEST PORT 1 VIEW  Result Date: 05/20/2020 CLINICAL DATA:  Endotracheal tube and NG tube placement. EXAM: PORTABLE CHEST 1 VIEW COMPARISON:  Chest radiograph yesterday. FINDINGS: The endotracheal tube is partially obscured by overlying enteric tube, however tip appears to be at the level of the clavicular heads, unchanged. Enteric tube in place with tip and side-port below the diaphragm. Right internal jugular dialysis catheter tip projects over the mid SVC. No pneumothorax. Small bilateral pleural effusions and adjacent atelectasis, more prominent on the left. Unchanged heart size and mediastinal contours. No pulmonary edema. IMPRESSION: 1. Endotracheal tube partially obscured by overlying enteric tube, however tip appears to be at the level of the clavicular heads. 2. Small bilateral pleural effusions and adjacent atelectasis, more prominent on the left. Electronically Signed   By: Keith Rake M.D.   On: 05/20/2020 21:37   DG CHEST PORT 1 VIEW  Result Date: 05/20/2020 CLINICAL DATA:  74 year old male ETT advanced. EXAM: PORTABLE CHEST 1 VIEW COMPARISON:  Portable chest 2101 hours today. FINDINGS: Portable AP semi upright view at 2110 hours. Endotracheal tube tip now just below the level the clavicles. Stable right IJ double-lumen catheter. Enteric tube courses to the abdomen. Stable cardiac size and mediastinal contours. Evidence of small bilateral pleural effusions. No pneumothorax or pulmonary edema. Patchy right perihilar  opacity is stable. IMPRESSION: 1. Endotracheal tube tip now just below the level the clavicles. 2. Otherwise stable lines and tubes. 3. Stable ventilation with small bilateral pleural effusions and patchy right perihilar opacity. Electronically Signed   By: Genevie Ann M.D.   On: 05/20/2020 21:36   DG Abd Portable 1V  Result Date: 05/20/2020 CLINICAL DATA:  74 year old male NG tube placed. EXAM: PORTABLE ABDOMEN - 1 VIEW COMPARISON:  Portable chest from the same time. CT Abdomen and Pelvis 05/16/2020 FINDINGS: Portable AP view at 2113 hours. Enteric tube placed into the stomach with side hole at the level of the gastric fundus. Underlying moderate gas distended stomach, probably mildly progressed from the CT 4 days ago. Other visible gas-filled bowel. IMPRESSION: 1. Enteric tube placed into the stomach, side hole at the level of the gastric fundus. 2. Distended stomach, recommend enteric tube to suction. Electronically Signed   By: Genevie Ann M.D.   On: 05/20/2020 21:37    Anti-infectives: Anti-infectives (From admission, onward)   Start     Dose/Rate Route Frequency Ordered Stop   05/22/20 1200  vancomycin (VANCOREADY) IVPB 1250 mg/250 mL     Discontinue     1,250 mg 166.7 mL/hr over 90 Minutes Intravenous Every 24 hours 05/21/20 1347     05/22/20 0400  vancomycin (VANCOREADY) IVPB 1250 mg/250 mL  Status:  Discontinued        1,250 mg 166.7 mL/hr over 90 Minutes Intravenous Every 24 hours 05/21/20 0218 05/21/20 1201   05/21/20 1201  vancomycin variable dose per unstable renal function (pharmacist dosing)  Status:  Discontinued  Does not apply See admin instructions 05/21/20 1201 05/22/20 0708   05/21/20 0315  vancomycin (VANCOCIN) 2,500 mg in sodium chloride 0.9 % 500 mL IVPB        2,500 mg 250 mL/hr over 120 Minutes Intravenous  Once 05/21/20 0218 05/21/20 0540   05/21/20 0315  ceFEPIme (MAXIPIME) 2 g in sodium chloride 0.9 % 100 mL IVPB     Discontinue     2 g 200 mL/hr over 30 Minutes  Intravenous Every 12 hours 05/21/20 0218     05/16/20 1700  ceFAZolin (ANCEF) IVPB 1 g/50 mL premix        1 g 100 mL/hr over 30 Minutes Intravenous Every 8 hours 05/16/20 1600 05/17/20 0206   05/16/20 0600  ceFAZolin (ANCEF) 3 g in dextrose 5 % 50 mL IVPB        3 g 100 mL/hr over 30 Minutes Intravenous 30 min pre-op 05/15/20 0725 05/16/20 1944      Assessment/Plan:  1 - RIGHT Renal Neoplasm - no further cancer directed therapy this admission.   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 CRRT initiated with 2.6L off yesterday. Appreciate recommendations of Nephrology, CCM  3 - Uremic encephalopathy - secondary to above, remains intubated, sedated  4 - Fever / Possible sepsis - agree with plan for CT scan today. Low likelihood of active bleed given slow Hb drift, no tachycardia.   5- Acute Blood Loss Anemia - Hb drift to 6.7 today, agree with blood transfusion.      Todd Robertson 05/22/2020

## 2020-05-22 NOTE — Progress Notes (Signed)
eLink Physician-Brief Progress Note Patient Name: Todd Robertson DOB: October 12, 1946 MRN: [id number]   Date of Service  05/22/2020  HPI/Events of Note  Multiple issues: 1. Anemia - Hgb = 6.6 and 2. Hypotension - Phenyl  eICU Interventions  Plan: 1. Transfuse 1 unit PRBC. 2. 25% albumin 12.5 gm IV now.  3. Vasopressin IV infusion at shock dose.      Intervention Category Major Interventions: Other:;Hypotension - evaluation and management  Melodi Happel [name] 05/22/2020, 6:07 AM

## 2020-05-22 NOTE — Progress Notes (Signed)
eLink Physician-Brief Progress Note Patient Name: Todd Robertson DOB: 1946/10/13 MRN: [id number]   Date of Service  05/22/2020  HPI/Events of Note  Hypotension - Bedside nurse does not trust cuff BP. Request for A-line.    eICU Interventions  Plan: 1. RT to place A-line.     Intervention Category Major Interventions: Hypotension - evaluation and management  Yaron Grasse Eugene 05/22/2020, 4:20 AM

## 2020-05-22 NOTE — Procedures (Addendum)
Artrial Line Insertion Procedure Note  Todd Robertson  [id number]  [birth year]  Date:05/22/20  Time:8:02 AM    Provider Performing: Omar Person   Procedure: Insertion of Arterial Line [id number]) with US guidance (82641)   Indication(s) Blood pressure monitoring and/or need for frequent ABGs  Consent Unable to obtain consent due to emergent nature of procedure.  Anesthesia None   Time Out Verified patient identification, verified procedure, site/side was marked, verified correct patient position, special equipment/implants available, medications/allergies/relevant history reviewed, required imaging and test results available.  Sterile Technique Sterile Technique including drape, chlorhexidine, sterile gloves, sterile probe cover    Procedure Description Area of catheter insertion was cleaned with chlorhexidine and draped in sterile fashion. With real-time ultrasound guidance an arterial catheter was placed into the right radial artery.  Appropriate arterial tracings confirmed on monitor.    Complications/Tolerance None; patient tolerated the procedure well.   EBL Minimal  Specimen(s) None  [name], AGACNP-BC [facility]  Pgr: [phone]  PCCM Pgr: [phone]  I was available if needed for assistance.   Garner Nash, DO [facility] 05/22/2020 9:29 AM

## 2020-05-22 NOTE — Progress Notes (Signed)
[facility] Progress Note  Interval events/Subjective: CRRT running fine now after line change out yesterday.  Heparin through circuit stopped this AM in light of Hb drifting down.  Going for CT abd eval status of hemorrhage noted on prior CT.  Tm 99.9.  Changed from phenyl to NE/vaso.  Vitals:   05/22/20 1000 05/22/20 1030 05/22/20 1100 05/22/20 1133  BP:      Pulse: 71 (!) 103 100 95  Resp: (!) 24 (!) 24 (!) 21 (!) 24  Temp:    99.5 F (37.5 C)  TempSrc:    Oral  SpO2: 100% 100% 100% 100%  Weight:      Height:        Exam: Gen large-framed AAM intubated and sedated No jvd or bruits Chest clear ant, dec BS bases RRR no MRG Abd marked abd obesity, lap wounds in R abdomen, distended - seems a bit more so now, nontender; RN has marked areas of ecchymoses on abd and thigh which appear to be expanding MS no joint effusions or deformity Ext 1+ bilat UE edema, no sig LE edema Neuro sedated    Home meds:  - asa 71m/ lipitor 40 hs/ benazepril 20 am/ bumex 139mqd/ coreg 25 bid  - allopurinol 300 hs  - symbicort ACT 2 puff bid  - prn's/ vitamins/ supplements  Date                             Creat               egFR   2018                          1.79- 2.38        30- 43   2019                          1.80- 1.81        42-43   January 12, 2020           3.00                    May 13, 2020                2.60                 27   Jul 9                           2.51                 29- 33   Jul 10                         3.42, 3.89    UA none yet   CT abd wo contrast 7/9 - Adrenals/Urinary Tract: Post right nephrectomy. Large volume of hemorrhage in the right nephrectomy bed. Ill-defined air fluid collection tracks in the right retroperitoneum anterior to the iliopsoas muscle and laterally in the abdominal cavity. Hemorrhage tracks into the right aspect of the pelvis in the is only partially included in the field of view. There is moderate subcutaneous emphysema  involving the right abdominal wall. Small foci of air dissects between the right abdominal wall musculature. Simple cyst in the left kidney.  There is no left hydronephrosis.     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  HD catheter placed IR 7/13 - not functional so replaced bedside 7/15 by PCCM.  Now running CRRT in setting of septic shock.  BID labs.  Have discussed with pt and family yesterday likely outcome will be long term HD.  Will need transition to tunnel cath at some point. 2. Renal mass - sp R nephrectomy 05/16/20 3. Anemia ckd / abl - Hb down 12 > 10 > 8.1> 7.6> 7.0>mid 6s now.  CT 7/9 with hemorrhage in nephrectomy bed - agree with repeat ordered for today.   Transfuse per primary. Iron indices show tsat 10%.  Hold IV iron in setting of septic shock.  Start ESA during admission once confirm malignancy was curative surgery.  4. Shock, presumed septic - on pressors and broad spectrum abx per primary.  Cultures and abd imaging pending. 5. Hyponatremia - hypervolemic, secondary to #1.  Will correct with CRRT. 6. Hyperkalemia - Corrected to K 4.9 this AM, currently on all 2K fluids.  BID labs.  Jannifer Hick MD [facility] Pager [phone]   Recent Labs  Lab 05/21/20 1639 05/21/20 1639 05/22/20 0103 05/22/20 0258 05/22/20 0508  K 6.2*   < > 4.5 4.9  --   BUN 102*   < > 81* 85*  --   CREATININE 8.82*   < > 6.70* 6.97*  --   CALCIUM 8.0*   < > 7.9* 8.0*  --   PHOS 5.9*  --   --  4.7*  --   HGB 8.0*  --   --   --  6.6*   < > = values in this interval not displayed.   Inpatient medications: . sodium chloride   Intravenous Once  . aspirin  81 mg Per Tube Daily  . chlorhexidine gluconate (MEDLINE KIT)  15 mL Mouth Rinse BID  . Chlorhexidine Gluconate Cloth  6 each Topical Daily  . docusate  100 mg Per Tube BID  . famotidine  20 mg Per Tube Daily  . feeding  supplement (PROSource TF)  45 mL Per Tube QID  . insulin aspart  0-9 Units Subcutaneous Q4H  . mouth rinse  15 mL Mouth Rinse 10 times per day  . mometasone-formoterol  2 puff Inhalation BID  . sodium chloride flush  10-40 mL Intracatheter Q12H   . ceFEPime (MAXIPIME) IV 2 g (05/22/20 0922)  . feeding supplement (VITAL AF 1.2 CAL) Stopped (05/22/20 1000)  . fentaNYL infusion INTRAVENOUS 75 mcg/hr (05/22/20 0700)  . midazolam Stopped (05/21/20 1232)  . norepinephrine (LEVOPHED) Adult infusion 20 mcg/min (05/22/20 1014)  . phenylephrine (NEO-SYNEPHRINE) Adult infusion Stopped (05/22/20 1001)  . prismasol BGK 2/2.5 dialysis solution 1,500 mL/hr at 05/22/20 0900  . prismasol BGK 2/2.5 replacement solution 500 mL/hr at 05/22/20 1054  . prismasol BGK 2/2.5 replacement solution 300 mL/hr at 05/22/20 0158  . vancomycin 1,250 mg (05/22/20 1121)  . vasopressin 0.03 Units/min (05/22/20 0254)   acetaminophen, albuterol, alum & mag hydroxide-simeth, diphenhydrAMINE **OR** diphenhydrAMINE, fentaNYL (SUBLIMAZE) injection, heparin, menthol-cetylpyridinium, ondansetron, oxyCODONE, promethazine, sodium chloride, sodium chloride flush, tiZANidine

## 2020-05-23 ENCOUNTER — Inpatient Hospital Stay (HOSPITAL_COMMUNITY): Payer: Medicare Other

## 2020-05-23 DIAGNOSIS — Z992 Dependence on renal dialysis: Secondary | ICD-10-CM

## 2020-05-23 LAB — CBC
HCT: 22.6 % — ABNORMAL LOW (ref 39.0–52.0)
HCT: 22.7 % — ABNORMAL LOW (ref 39.0–52.0)
Hemoglobin: 7.2 g/dL — ABNORMAL LOW (ref 13.0–17.0)
Hemoglobin: 7.1 g/dL — ABNORMAL LOW (ref 13.0–17.0)
MCH: 28.8 pg (ref 26.0–34.0)
MCH: 28.6 pg (ref 26.0–34.0)
MCHC: 31.9 g/dL (ref 30.0–36.0)
MCHC: 31.3 g/dL (ref 30.0–36.0)
MCV: 90.4 fL (ref 80.0–100.0)
MCV: 91.5 fL (ref 80.0–100.0)
Platelets: 149 10*3/uL — ABNORMAL LOW (ref 150–400)
Platelets: 137 10*3/uL — ABNORMAL LOW (ref 150–400)
RBC: 2.5 MIL/uL — ABNORMAL LOW (ref 4.22–5.81)
RBC: 2.48 MIL/uL — ABNORMAL LOW (ref 4.22–5.81)
RDW: 15.8 % — ABNORMAL HIGH (ref 11.5–15.5)
RDW: 16 % — ABNORMAL HIGH (ref 11.5–15.5)
WBC: 14.1 10*3/uL — ABNORMAL HIGH (ref 4.0–10.5)
WBC: 14.1 10*3/uL — ABNORMAL HIGH (ref 4.0–10.5)
nRBC: 0.5 % — ABNORMAL HIGH (ref 0.0–0.2)
nRBC: 0.6 % — ABNORMAL HIGH (ref 0.0–0.2)

## 2020-05-23 LAB — MAGNESIUM: Magnesium: 2.7 mg/dL — ABNORMAL HIGH (ref 1.7–2.4)

## 2020-05-23 LAB — POCT I-STAT 7, (LYTES, BLD GAS, ICA,H+H)
Acid-Base Excess: 0 mmol/L (ref 0.0–2.0)
Bicarbonate: 24.4 mmol/L (ref 20.0–28.0)
Calcium, Ion: 1.15 mmol/L (ref 1.15–1.40)
HCT: 21 % — ABNORMAL LOW (ref 39.0–52.0)
Hemoglobin: 7.1 g/dL — ABNORMAL LOW (ref 13.0–17.0)
O2 Saturation: 97 %
Patient temperature: 97.8
Potassium: 3.9 mmol/L (ref 3.5–5.1)
Sodium: 135 mmol/L (ref 135–145)
TCO2: 26 mmol/L (ref 22–32)
pCO2 arterial: 38.5 mmHg (ref 32.0–48.0)
pH, Arterial: 7.409 (ref 7.350–7.450)
pO2, Arterial: 85 mmHg (ref 83.0–108.0)

## 2020-05-23 LAB — BPAM RBC
Blood Product Expiration Date: 202108072359
ISSUE DATE / TIME: 202107150858
Unit Type and Rh: 5100

## 2020-05-23 LAB — RENAL FUNCTION PANEL
Albumin: 2.3 g/dL — ABNORMAL LOW (ref 3.5–5.0)
Albumin: 2.3 g/dL — ABNORMAL LOW (ref 3.5–5.0)
Anion gap: 11 (ref 5–15)
Anion gap: 14 (ref 5–15)
BUN: 57 mg/dL — ABNORMAL HIGH (ref 8–23)
BUN: 45 mg/dL — ABNORMAL HIGH (ref 8–23)
CO2: 23 mmol/L (ref 22–32)
CO2: 21 mmol/L — ABNORMAL LOW (ref 22–32)
Calcium: 8.1 mg/dL — ABNORMAL LOW (ref 8.9–10.3)
Calcium: 8 mg/dL — ABNORMAL LOW (ref 8.9–10.3)
Chloride: 100 mmol/L (ref 98–111)
Chloride: 100 mmol/L (ref 98–111)
Creatinine, Ser: 4.37 mg/dL — ABNORMAL HIGH (ref 0.61–1.24)
Creatinine, Ser: 3.55 mg/dL — ABNORMAL HIGH (ref 0.61–1.24)
GFR calc Af Amer: 15 mL/min — ABNORMAL LOW (ref >60)
GFR calc Af Amer: 19 mL/min — ABNORMAL LOW (ref >60)
GFR calc non Af Amer: 13 mL/min — ABNORMAL LOW (ref >60)
GFR calc non Af Amer: 16 mL/min — ABNORMAL LOW (ref >60)
Glucose, Bld: 156 mg/dL — ABNORMAL HIGH (ref 70–99)
Glucose, Bld: 149 mg/dL — ABNORMAL HIGH (ref 70–99)
Phosphorus: 3.5 mg/dL (ref 2.5–4.6)
Phosphorus: 3.2 mg/dL (ref 2.5–4.6)
Potassium: 4 mmol/L (ref 3.5–5.1)
Potassium: 4.1 mmol/L (ref 3.5–5.1)
Sodium: 134 mmol/L — ABNORMAL LOW (ref 135–145)
Sodium: 135 mmol/L (ref 135–145)

## 2020-05-23 LAB — POCT I-STAT, CHEM 8
BUN: 63 mg/dL — ABNORMAL HIGH (ref 8–23)
Calcium, Ion: 1.16 mmol/L (ref 1.15–1.40)
Chloride: 97 mmol/L — ABNORMAL LOW (ref 98–111)
Creatinine, Ser: 5.5 mg/dL — ABNORMAL HIGH (ref 0.61–1.24)
Glucose, Bld: 199 mg/dL — ABNORMAL HIGH (ref 70–99)
HCT: 20 % — ABNORMAL LOW (ref 39.0–52.0)
Hemoglobin: 6.8 g/dL — CL (ref 13.0–17.0)
Potassium: 4.2 mmol/L (ref 3.5–5.1)
Sodium: 134 mmol/L — ABNORMAL LOW (ref 135–145)
TCO2: 20 mmol/L — ABNORMAL LOW (ref 22–32)

## 2020-05-23 LAB — GLUCOSE, CAPILLARY
Glucose-Capillary: 142 mg/dL — ABNORMAL HIGH (ref 70–99)
Glucose-Capillary: 149 mg/dL — ABNORMAL HIGH (ref 70–99)
Glucose-Capillary: 131 mg/dL — ABNORMAL HIGH (ref 70–99)
Glucose-Capillary: 131 mg/dL — ABNORMAL HIGH (ref 70–99)
Glucose-Capillary: 137 mg/dL — ABNORMAL HIGH (ref 70–99)
Glucose-Capillary: 113 mg/dL — ABNORMAL HIGH (ref 70–99)

## 2020-05-23 LAB — CULTURE, RESPIRATORY W GRAM STAIN: Culture: NORMAL

## 2020-05-23 LAB — TYPE AND SCREEN
ABO/RH(D): O POS
Antibody Screen: NEGATIVE
Unit division: 0

## 2020-05-23 LAB — APTT: aPTT: 37 seconds — ABNORMAL HIGH (ref 24–36)

## 2020-05-23 MED ORDER — POLYETHYLENE GLYCOL 3350 17 G PO PACK
17.0000 g | PACK | Freq: Every day | ORAL | Status: DC
  Administered 2020-05-23 – 2020-06-01 (×4): 17 g
  Filled 2020-05-23 (×4): qty 1

## 2020-05-23 MED ORDER — METOCLOPRAMIDE HCL 5 MG/ML IJ SOLN
5.0000 mg | Freq: Three times a day (TID) | INTRAMUSCULAR | Status: AC
  Administered 2020-05-23 – 2020-05-24 (×3): 5 mg via INTRAVENOUS
  Filled 2020-05-23 (×3): qty 2

## 2020-05-23 MED ORDER — PRISMASOL BGK 4/2.5 32-4-2.5 MEQ/L IV SOLN
INTRAVENOUS | Status: DC
  Filled 2020-05-23 (×19): qty 5000

## 2020-05-23 MED ORDER — PRISMASOL BGK 4/2.5 32-4-2.5 MEQ/L REPLACEMENT SOLN
Status: DC
  Filled 2020-05-23 (×3): qty 5000

## 2020-05-23 MED ORDER — SENNOSIDES 8.8 MG/5ML PO SYRP: 5.0000 mL | ORAL_SOLUTION | Freq: Two times a day (BID) | ORAL | Status: DC

## 2020-05-23 MED ORDER — SENNOSIDES 8.8 MG/5ML PO SYRP
5.0000 mL | ORAL_SOLUTION | Freq: Two times a day (BID) | ORAL | Status: DC
  Administered 2020-05-23 – 2020-06-01 (×8): 5 mL
  Filled 2020-05-23 (×9): qty 5

## 2020-05-23 MED ORDER — PRISMASOL BGK 4/2.5 32-4-2.5 MEQ/L REPLACEMENT SOLN
Status: DC
  Filled 2020-05-23 (×13): qty 5000

## 2020-05-23 NOTE — Progress Notes (Signed)
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 S/p right nephrectomy 7/9. Initially near anuric POD 0. CT 7/9 PM unremarkable left (solitary) kidney. CRRT initiated 7/14, 2.5L removed yesterday. Scant urine output daily.    3- Acute Blood Loss Anemia - Hgb 8s pos-op (after 3L fluid). Downtrended to 7, has received 3 u pRBC. Initial CT with expected post-op changes and hematoma in nephrectomy bed, resolving on repeat CT scan. Low concern for persistent bleeding. Hb stable today.   4 - Uremic encephalopathy- Patient remains intubated and sedated  5 - Fever / Multifocal PNA - Patient febrile to 102 7/13, blood and bronchial cultures collected, no growth. CT and CXR concerning for multifocal PNA.   6 - Ileus - No bowel movement for past 3 days, CT with distended stomach, bowel concerning for ileus. NGT in place, 839ml output   Objective: Vital signs in last 24 hours: Temp:  [97.4 F (36.3 C)-98.4 F (36.9 C)] 97.4 F (36.3 C) (07/16 1115) Pulse Rate:  [37-103] 73 (07/16 1215) Resp:  [11-24] 24 (07/16 1215) BP: (116-137)/(65-84) 137/84 (07/15 1500) SpO2:  [95 %-100 %] 100 % (07/16 1215) Arterial Line BP: (95-159)/(45-72) 115/56 (07/16 1215) FiO2 (%):  [30 %] 30 % (07/16 0734) Weight:  [371 kg] 133 kg (07/16 0500) Last BM Date: 05/19/20  Intake/Output from previous day: 07/15 0701 - 07/16 0700 In: 3087.6 [I.V.:759.9; Blood:360.5; NG/GT:150; IV Piggyback:1817.3] Out: 3307 [Urine:85; Emesis/NG output:800] Intake/Output this shift: Total I/O In: 309.2 [I.V.:109.2; Other:10; NG/GT:90; IV Piggyback:100] Out: 0626 [Urine:30; Emesis/NG output:800; Other:802]  General appearance: Intubated, sedated Resp: mechanically ventilated Cardio: HR 70s on monitor, regular.  GU:  foley in place with clear yellow urine in bag Extremities: diffusely edematous Skin: Skin color, texture, turgor normal. No rashes or lesions  Abd: Obese, distended, tympantic. Large right flank ecchymosis.   Lab Results:  Recent Labs    05/22/20 1812 05/22/20 1812 05/23/20 0200 05/23/20 0400  WBC 13.5*  --  14.1*  --   HGB 7.4*   < > 7.2* 7.1*  HCT 23.0*   < > 22.6* 21.0*  PLT 159  --  149*  --    < > = values in this interval not displayed.   BMET Recent Labs    05/22/20 1726 05/22/20 1726 05/22/20 1727 05/22/20 1727 05/23/20 0200 05/23/20 0400  NA 134*   < > 134*   < > 134* 135  K 4.3   < > 4.2   < > 4.0 3.9  CL 101   < > 97*  --  100  --   CO2 21*  --   --   --  23  --   GLUCOSE 198*   < > 199*  --  156*  --   BUN 67*   < > 63*  --  57*  --   CREATININE 5.23*   < > 5.50*  --  4.37*  --   CALCIUM 7.9*  --   --   --  8.1*  --    < > = values in this interval not displayed.   PT/INR No results for input(s): LABPROT, INR in the last 72 hours. ABG Recent Labs    05/20/20 2308 05/23/20 0400  PHART 7.311* 7.409  HCO3 25.3 24.4  Studies/Results: CT ABDOMEN PELVIS WO CONTRAST  Result Date: 05/22/2020 CLINICAL DATA:  Anemia.  Recent right nephrectomy. EXAM: CT ABDOMEN AND PELVIS WITHOUT CONTRAST TECHNIQUE: Multidetector CT imaging of the abdomen and pelvis was performed following the standard protocol without IV contrast. COMPARISON:  CT abdomen dated May 16, 2020. CT abdomen pelvis dated February 01, 2020. FINDINGS: Lower chest: Patchy consolidation in the right middle lobe, right lower lobe, and lingula. Increased small right and new trace left pleural effusions. Unchanged trace pericardial effusion. Hepatobiliary: Decreased perihepatic hemorrhage. No focal liver abnormality is seen. The gallbladder is unremarkable. No biliary dilatation. Pancreas: Unremarkable. No pancreatic ductal dilatation or surrounding inflammatory changes. Spleen: Normal in size without focal  abnormality. Adrenals/Urinary Tract: The left adrenal gland is normal. The right adrenal gland is not well seen. Unchanged small left renal cyst. No calculi or hydronephrosis. Status post right nephrectomy with decreasing hemorrhage in nephrectomy bed. There is a small amount of hemorrhage tracking in the right retroperitoneum anterior to the psoas muscle, also decreased compared to the prior study. Previously seen ill-defined air-fluid collection in the right retroperitoneum has resolved. The bladder is decompressed by Foley catheter. Stomach/Bowel: Enteric tube within a still distended stomach. No bowel wall thickening, obstruction, or surrounding inflammatory changes. Mild distention of the cecum. Normal appendix. Vascular/Lymphatic: Aortic atherosclerosis. No enlarged abdominal or pelvic lymph nodes. Reproductive: Prostate is unremarkable. Other: Resolved pneumoperitoneum. Trace hemorrhage in the pelvis. There is a small amount of residual emphysema in lower right anterior abdominal wall musculature just lateral to the inferior rectus abdominus muscle. Persistent but decreased subcutaneous emphysema in the right abdominal wall. Small bilateral fat containing inguinal hernias. Musculoskeletal: New mild anasarca. No acute or significant osseous findings. IMPRESSION: 1. Status post right nephrectomy with decreasing postoperative hemorrhage in the nephrectomy bed and right retroperitoneum. Previously seen ill-defined air-fluid collection in the right retroperitoneum has resolved. 2. Persistent but decreased subcutaneous emphysema in the right abdominal wall. 3. Increased small right and new trace left pleural effusions. Patchy consolidation in the right middle lobe, right lower lobe, and lingula, concerning for multifocal pneumonia, possibly related to aspiration. 4. Persistent gastric distention despite enteric tube placement. Correlate with tube function. 5. New mild anasarca. 6. Aortic Atherosclerosis  (ICD10-I70.0). Electronically Signed   By: Titus Dubin M.D.   On: 05/22/2020 12:36   DG Chest Port 1 View  Result Date: 05/23/2020 CLINICAL DATA:  74 year old male with history of ventilator dependency. Abdominal distension. EXAM: PORTABLE CHEST 1 VIEW COMPARISON:  Chest x-ray 05/21/2020. FINDINGS: An endotracheal tube is in place with tip 4.2 cm above the carina. There is a right-sided internal jugular central venous catheter with tip terminating in the mid superior vena cava. A nasogastric tube is seen extending into the stomach, however, the tip of the nasogastric tube extends below the lower margin of the image. Patchy multifocal airspace consolidation throughout the mid to lower lungs bilaterally (right greater than left), similar to the prior study. Small bilateral pleural effusions. No pneumothorax. No evidence of pulmonary edema. Heart size is normal. Upper mediastinal contours are within normal limits. Aortic atherosclerosis. IMPRESSION: 1. Support apparatus, as above. 2. Multilobar bilateral pneumonia redemonstrated. 3. Small bilateral pleural effusions. 4. Aortic atherosclerosis. Electronically Signed   By: Vinnie Langton M.D.   On: 05/23/2020 08:01   DG Chest Port 1 View  Result Date: 05/21/2020 CLINICAL DATA:  Endotracheal tube placement. EXAM: PORTABLE CHEST 1 VIEW COMPARISON:  Radiographs earlier this day. FINDINGS: The endotracheal tube tip is now 3.4 cm  from the carina. Enteric tube in place with tip below the diaphragm not included in the field of view. Right internal jugular central venous catheter tip projects over the SVC, unchanged. Stable heart size and mediastinal contours. Multifocal bilateral pulmonary opacities are not significantly changed from earlier this day. Suspected bilateral pleural effusions. Stable heart size and mediastinal contours. No pneumothorax. IMPRESSION: 1. Endotracheal tube tip now 3.4 cm from the carina. Enteric tube in place with tip below the diaphragm  not included in the field of view. 2. Unchanged multifocal bilateral pulmonary opacities, pneumonia versus edema. Probable pleural effusions. Electronically Signed   By: Keith Rake M.D.   On: 05/21/2020 16:56   DG Abd Portable 1V  Result Date: 05/23/2020 CLINICAL DATA:  Abdominal distention EXAM: PORTABLE ABDOMEN - 1 VIEW COMPARISON:  CT from yesterday FINDINGS: Moderate gaseous distention of the stomach. Diffuse gaseous distension of bowel. Mottled lucency over the right abdomen which is subcutaneous by CT. Infiltrates at the lung bases. Enteric tube with tip and side-port over the stomach. IMPRESSION: 1. More gaseous distension of bowel, possible ileus. 2. The enteric tube remains in good position; the stomach is still moderately distended. 3. Infiltrates at the lung bases as seen on CT yesterday. Electronically Signed   By: Monte Fantasia M.D.   On: 05/23/2020 08:17    Anti-infectives: Anti-infectives (From admission, onward)   Start     Dose/Rate Route Frequency Ordered Stop   05/22/20 1200  vancomycin (VANCOREADY) IVPB 1250 mg/250 mL     Discontinue     1,250 mg 166.7 mL/hr over 90 Minutes Intravenous Every 24 hours 05/21/20 1347     05/22/20 0400  vancomycin (VANCOREADY) IVPB 1250 mg/250 mL  Status:  Discontinued        1,250 mg 166.7 mL/hr over 90 Minutes Intravenous Every 24 hours 05/21/20 0218 05/21/20 1201   05/21/20 1201  vancomycin variable dose per unstable renal function (pharmacist dosing)  Status:  Discontinued         Does not apply See admin instructions 05/21/20 1201 05/22/20 0708   05/21/20 0315  vancomycin (VANCOCIN) 2,500 mg in sodium chloride 0.9 % 500 mL IVPB        2,500 mg 250 mL/hr over 120 Minutes Intravenous  Once 05/21/20 0218 05/21/20 0540   05/21/20 0315  ceFEPIme (MAXIPIME) 2 g in sodium chloride 0.9 % 100 mL IVPB     Discontinue     2 g 200 mL/hr over 30 Minutes Intravenous Every 12 hours 05/21/20 0218     05/16/20 1700  ceFAZolin (ANCEF) IVPB 1 g/50  mL premix        1 g 100 mL/hr over 30 Minutes Intravenous Every 8 hours 05/16/20 1600 05/17/20 0206   05/16/20 0600  ceFAZolin (ANCEF) 3 g in dextrose 5 % 50 mL IVPB        3 g 100 mL/hr over 30 Minutes Intravenous 30 min pre-op 05/15/20 0725 05/16/20 1944      Assessment/Plan:  1 - RIGHT Renal Neoplasm - s/p nephrectomy. no further cancer directed therapy this admission.   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 Appreciate recommendations of Nephrology, CCM. Likely transitioning to HD soon.   3 - Uremic encephalopathy - secondary to above, remains intubated, sedated  4 - Fever / PNA - on broad spectrum antibiotics, management per ICU team   5- Acute Blood Loss Anemia - CT with resolving hemorrhage, Hb  drift expected post operatively. No evidence of active post operative bleeding. Agree with PRN transfusion if needed from a cardiovascular/hemodynamic standpoint.   6- Post operative ileus - Continue NG tube, would keep NPO. Consider suppository.    Carmie Kanner 05/23/2020

## 2020-05-23 NOTE — Progress Notes (Signed)
NAME:  Todd Robertson, MRN:  [id number], DOB:  1946-03-13, LOS: 7 ADMISSION DATE:  05/16/2020, CONSULTATION DATE:  05/16/2020 REFERRING MD:  Dr Alyson Ingles, CHIEF COMPLAINT:  Shock   Brief History    74 year old male w/ hx as outlined below. Presented 7/9 for planned right Nephrectomy. Went to the OR that day. EBL estimated at 367ml. Intraoperative anesthesia note unremarkable. While in the PACU had declining w/ systolic BPs in 26J. Placed in Trendelenburg; IVF boluses administered (3 liters crystalloid and 250 albumin). BP normalized but only made 61ml of urine.  Because of this PCCM was asked to assess for circulatory shock and concern about hemodynamics  Past Medical History   Past Medical History:  Diagnosis Date  . Arthritis   . Cancer [facility])    prostate  . Cardiomyopathy    secondary  . CKD (chronic kidney disease)   . DM2 (diabetes mellitus, type 2) ([place])   . Gout   . HTN (hypertension)    unspec  . Hypercholesterolemia   . Nonischemic cardiomyopathy ([place])    a. EF 40-50% by most recent 2-D echo b. EF 25% by cardiac cath in 2004  . Overweight(278.02)     Significant Hospital Events   05/16/2020-right nephrectomy  PCCM asked to see post-op for post-operative hypotension. Baseline hgb: 12.3; post-op hgb 10.7 (in PACU). EBL intra-op 391ml.  7/13 Intubated and started on broad spectrum antibiotic for sepsis   Consults:  pccm  Procedures:  Robotic-right nephrectomy for papillary RCC   Significant Diagnostic Tests:   CT abdomen 7/9 IMPRESSION: 1. Post right nephrectomy. Large volume of right retroperitoneal hemorrhage with heterogeneous air and fluid which tracks into the pelvis and right upper quadrant. 2. Moderate subcutaneous emphysema involving the right abdominal wall. 3. Small right pleural effusion. Small pericardial effusion.  CT Abd Pelvis IMPRESSION: 1. Status post right nephrectomy with decreasing postoperative hemorrhage in the nephrectomy bed and right  retroperitoneum. Previously seen ill-defined air-fluid collection in the right retroperitoneum has resolved. 2. Persistent but decreased subcutaneous emphysema in the right abdominal wall. 3. Increased small right and new trace left pleural effusions. Patchy consolidation in the right middle lobe, right lower lobe, and lingula, concerning for multifocal pneumonia, possibly related to aspiration. 4. Persistent gastric distention despite enteric tube placement. Correlate with tube function. 5. New mild anasarca. 6. Aortic Atherosclerosis (ICD10-I70.0).  Micro Data:  7/13 Blood cultures: NGTD  7/13 Tracheal aspirate, rare yeast   Antimicrobials:  7/9-cefazolin 7/13  Cefepime > 7/13  Vancomycin >   Interim history/subjective:   No issues overnight. Remains critically ill on mechanical ventilation.   Objective   Blood pressure 137/84, pulse (!) 102, temperature 97.9 F (36.6 C), temperature source Axillary, resp. rate (!) 23, height 6\' 2"  (1.88 m), weight 133 kg, SpO2 100 %.    Vent Mode: PRVC FiO2 (%):  [30 %] 30 % Set Rate:  [24 bmp] 24 bmp Vt Set:  [600 mL] 600 mL PEEP:  [5 cmH20] 5 cmH20 Plateau Pressure:  [19 cmH20-26 cmH20] 26 cmH20   Intake/Output Summary (Last 24 hours) at 05/23/2020 0750 Last data filed at 05/23/2020 0700 Gross per 24 hour  Intake 3087.63 ml  Output 3307 ml  Net -219.37 ml   Filed Weights   05/21/20 0500 05/22/20 0500 05/23/20 0500  Weight: 131 kg 133 kg 133 kg    Examination: General: obese male, intubated on mv  HENT: large neck right IJ HD cath  Lungs: BL vented breaths  Cardiovascular: RRR  s1s2 Abdomen: soft, distended, tympanic to percussion  Extremities: dependent edema  Neuro: alert to voice, follows commands Skin: right flank soft  GU: deferred   Labs reviewed CXR: reviewed, vascular congestion, small pleural effusion, The patient's images have been independently reviewed by me.    Resolved Hospital Problem list      Assessment & Plan:   Acute hypoxic and hypercapnic respiratory failure requiring MV, 2/2 hypoventilation and altered mental status from:  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 Acute metabolic encephalopathy Uremic encephalopathy  Plan: - remains on vent  - adult vent protocol  - still too fluid positive to consider liberation from vent  - SBT SAT as tolerated  - VAP ppx  - continue cvvhd per nephrology   Anemia  - no signs of active bleeding  - ct abd reviewed  - conservation transfusion threshold of hgb <7 - we will give him more prbcs if needed   Possible Sepsis, possible aspiration pneumonia  Septic shock  cx NGTD, rare yeast on trach aspirate - continue cefepime and vancomycin  - RLL and RML patchy infiltrates on CT abd - possible de-escalate to unasyn soon  - wean pressors to maintain MAP >31mmHg   Type 2 diabetes At goal this am - continue CBGS with ssi   Heart failure with preserved ejection fraction - continue volume removal   Renal cell cancer - s/p nephrectomy   Past history of smoking - scheduled BDs    Best practice:  Diet: Renal diet Pain/Anxiety/Delirium protocol (if indicated): prn oxy  VAP protocol (if indicated): Not indicated DVT prophylaxis: SCD GI prophylaxis: pepcid Glucose control: monitor Mobility: bedrest Code Status: full Family Communication: we will update family again today at bedside, they visit regularly  Disposition: ICU  Labs    CBC: Recent Labs  Lab 05/18/20 0240 05/18/20 2002 05/19/20 0722 05/19/20 0722 05/20/20 0324 05/20/20 1643 05/21/20 1639 05/21/20 1639 05/22/20 0508 05/22/20 1622 05/22/20 1812 05/23/20 0200 05/23/20 0400  WBC 20.2*  --  18.6*   < > 15.0*   < > 16.9*  --  13.2* 13.6* 13.5* 14.1*  --   NEUTROABS 16.9*  --  15.9*  --  12.4*  --   --   --   --   --   --   --   --   HGB 7.6*   < > 7.0*   < > 7.0*   < > 8.0*   < > 6.6* 6.8* 7.4* 7.2* 7.1*  HCT 24.5*   < > 22.9*   < >  22.5*   < > 24.5*   < > 20.9* 21.0* 23.0* 22.6* 21.0*  MCV 91.8  --  92.7   < > 94.5   < > 91.4  --  90.9 90.5 90.6 90.4  --   PLT 141*  --  124*   < > 145*   < > 167  --  165 143* 159 149*  --    < > = values in this interval not displayed.    Basic Metabolic Panel: Recent Labs  Lab 05/19/20 0722 05/19/20 0722 05/20/20 0324 05/20/20 1144 05/21/20 0301 05/21/20 0336 05/21/20 1639 05/21/20 1639 05/22/20 0103 05/22/20 0103 05/22/20 0258 05/22/20 1630 05/22/20 1726 05/23/20 0200 05/23/20 0400  NA 129*   < > 129*   < > 128*   < > 130*   < > 130*   < > 132* 139 134* 134* 135  K 5.4*   < >  6.1*   < > 5.4*   < > 6.2*   < > 4.5   < > 4.9 2.7* 4.3 4.0 3.9  CL 96*   < > 96*   < > 96*   < > 97*   < > 98  --  98 118* 101 100  --   CO2 24   < > 22   < > 21*   < > 18*   < > 21*  --  20* 13* 21* 23  --   GLUCOSE 180*   < > 115*   < > 84   < > 146*   < > 170*  --  157* 142* 198* 156*  --   BUN 81*   < > 96*   < > 110*   < > 102*   < > 81*  --  85* 48* 67* 57*  --   CREATININE 7.44*   < > 8.84*   < > 9.46*   < > 8.82*   < > 6.70*  --  6.97* 3.41* 5.23* 4.37*  --   CALCIUM 8.0*   < > 8.2*   < > 8.0*   < > 8.0*   < > 7.9*  --  8.0* 5.0* 7.9* 8.1*  --   MG 2.3  --  2.7*  --  2.5*  --   --   --  2.5*  --   --   --   --  2.7*  --   PHOS  --   --   --   --  5.8*   < > 5.9*  --   --   --  4.7* 2.3* 3.7 3.5  --    < > = values in this interval not displayed.   GFR: Estimated Creatinine Clearance: 22.2 mL/min (A) (by C-G formula based on SCr of 4.37 mg/dL (H)). Recent Labs  Lab 05/22/20 0508 05/22/20 1622 05/22/20 1812 05/23/20 0200  WBC 13.2* 13.6* 13.5* 14.1*    Liver Function Tests: Recent Labs  Lab 05/18/20 2002 05/21/20 0301 05/21/20 1639 05/22/20 0258 05/22/20 1630 05/22/20 1726 05/23/20 0200  AST 25  --   --   --   --   --   --   ALT 8  --   --   --   --   --   --   ALKPHOS 42  --   --   --   --   --   --   BILITOT 0.7  --   --   --   --   --   --   PROT 5.4*  --   --   --    --   --   --   ALBUMIN 3.1*   < > 2.5* 2.3* 1.5* 2.4* 2.3*   < > = values in this interval not displayed.   No results for input(s): LIPASE, AMYLASE in the last 168 hours. No results for input(s): AMMONIA in the last 168 hours.  ABG    Component Value Date/Time   PHART 7.409 05/23/2020 0400   PCO2ART 38.5 05/23/2020 0400   PO2ART 85 05/23/2020 0400   HCO3 24.4 05/23/2020 0400   TCO2 26 05/23/2020 0400   ACIDBASEDEF 1.0 05/20/2020 2308   O2SAT 97.0 05/23/2020 0400     Coagulation Profile: No results for input(s): INR, PROTIME in the last 168 hours.  Cardiac Enzymes: No results for input(s): CKTOTAL, CKMB, CKMBINDEX, TROPONINI in the  last 168 hours.  HbA1C: Hgb A1c MFr Bld  Date/Time Value Ref Range Status  05/13/2020 08:45 AM 6.2 (H) 4.8 - 5.6 % Final    Comment:    (NOTE) Pre diabetes:          5.7%-6.4%  Diabetes:              >6.4%  Glycemic control for   <7.0% adults with diabetes   11/11/2017 03:03 PM 5.8 (H) 4.8 - 5.6 % Final    Comment:    (NOTE) Pre diabetes:          5.7%-6.4% Diabetes:              >6.4% Glycemic control for   <7.0% adults with diabetes     CBG: Recent Labs  Lab 05/22/20 1621 05/22/20 1907 05/22/20 2301 05/23/20 0306 05/23/20 0713  GLUCAP 181* 174* 163* 142* 149*   This patient is critically ill with multiple organ system failure; which, requires frequent high complexity decision making, assessment, support, evaluation, and titration of therapies. This was completed through the application of advanced monitoring technologies and extensive interpretation of multiple databases. During this encounter critical care time was devoted to patient care services described in this note for 32 minutes.  [facility] 05/23/2020 7:50 AM

## 2020-05-23 NOTE — Progress Notes (Signed)
[facility] Progress Note  Interval events/Subjective:   Pulling 24m/ or more   Off NE, still on VP  Didn't ween well today on vent  No sig UOP  K 4.0, on all 2K  2 clotting epidsodes overnight  Vitals:   05/23/20 0815 05/23/20 0830 05/23/20 0845 05/23/20 0900  BP:      Pulse: 73 71 68 71  Resp: (!) 24 (!) 24 (!) 24 (!) 24  Temp:      TempSrc:      SpO2: 100% 100% 100% 100%  Weight:      Height:        Exam: Gen large-framed AAM intubated and sedated No jvd or bruits Chest clear ant, dec BS bases RRR no MRG Abd marked abd obesity, lap wounds in R abdomen, distended - seems a bit more so now, nontender; RN has marked areas of ecchymoses on abd and thigh which appear to be expanding MS no joint effusions or deformity Ext 1+ bilat UE edema, no sig LE edema Neuro sedated R TEMP HD cath     Home meds:  - asa 87m lipitor 40 hs/ benazepril 20 am/ bumex 29m73md/ coreg 25 bid  - allopurinol 300 hs  - symbicort ACT 2 puff bid  - prn's/ vitamins/ supplements  Date                             Creat               egFR   2018                          1.79- 2.38        30- 43   2019                          1.80- 1.81        42-43   January 12, 2020           3.00                    May 13, 2020                2.60                 27   Jul 9                           2.51                 29- 33   Jul 10                         3.42, 3.89    UA none yet   CT abd wo contrast 7/9 - Adrenals/Urinary Tract: Post right nephrectomy. Large volume of hemorrhage in the right nephrectomy bed. Ill-defined air fluid collection tracks in the right retroperitoneum anterior to the iliopsoas muscle and laterally in the abdominal cavity. Hemorrhage tracks into the right aspect of the pelvis in the is only partially included in the field of view. There is moderate subcutaneous emphysema involving the right abdominal wall. Small foci of air dissects between the right abdominal  wall musculature. Simple cyst in the left kidney. There is no left hydronephrosis.  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  HD catheter placed IR 7/13 - not functional so replaced bedside 7/15 by PCCM.  Now running CRRT in setting of shock.  BID labs.  Have discussed with pt and family yesterday likely outcome will be long term HD.  Will need transition to tunnel cath at some point.  Change pre to 1000/ post 200 to lessen clotting risk  2. Renal mass - sp R nephrectomy 05/16/20 3. Anemia ckd / abl - Hb down 12 to 7, transfusion per primary.  CT 7/9 with hemorrhage in nephrectomy bed - agree with repeat ordered for today.   Transfuse per primary. Iron indices show tsat 10%.  Holding IV iron in setting of septic shock.  Start ESA during admission once confirm malignancy was curative surgery.  4. Shock, presumed septic - weening pressors and broad spectrum abx per primary.  Cultures and abd imaging pending. 5. Hyponatremia - stable, correcting with CRRT. 6. Hyperkalemia - resolved, move to all 4K today  [facility]   Recent Labs  Lab 05/22/20 1726 05/22/20 1812 05/23/20 0200 05/23/20 0400  K 4.3   < > 4.0 3.9  BUN 67*  --  57*  --   CREATININE 5.23*  --  4.37*  --   CALCIUM 7.9*  --  8.1*  --   PHOS 3.7  --  3.5  --   HGB  --    < > 7.2* 7.1*   < > = values in this interval not displayed.   Inpatient medications: . aspirin  81 mg Per Tube Daily  . chlorhexidine gluconate (MEDLINE KIT)  15 mL Mouth Rinse BID  . Chlorhexidine Gluconate Cloth  6 each Topical Daily  . docusate  100 mg Per Tube BID  . famotidine  20 mg Per Tube Daily  . feeding supplement (PROSource TF)  45 mL Per Tube QID  . insulin aspart  0-9 Units Subcutaneous Q4H  . mouth rinse  15 mL Mouth Rinse 10 times per day  . mometasone-formoterol  2 puff Inhalation BID  . sodium chloride flush   10-40 mL Intracatheter Q12H   .  prismasol BGK 4/2.5    .  prismasol BGK 4/2.5    . ceFEPime (MAXIPIME) IV 2 g (05/23/20 0913)  . feeding supplement (VITAL AF 1.2 CAL) Stopped (05/22/20 1000)  . fentaNYL infusion INTRAVENOUS 125 mcg/hr (05/23/20 0900)  . midazolam Stopped (05/21/20 1232)  . norepinephrine (LEVOPHED) Adult infusion Stopped (05/23/20 0720)  . phenylephrine (NEO-SYNEPHRINE) Adult infusion Stopped (05/22/20 1001)  . prismasol BGK 4/2.5    . vancomycin 1,250 mg (05/22/20 1121)  . vasopressin 0.03 Units/min (05/23/20 0900)   acetaminophen, albuterol, alum & mag hydroxide-simeth, diphenhydrAMINE **OR** diphenhydrAMINE, fentaNYL (SUBLIMAZE) injection, heparin, menthol-cetylpyridinium, ondansetron, oxyCODONE, promethazine, sodium chloride, sodium chloride flush, tiZANidine

## 2020-05-24 DIAGNOSIS — J9601 Acute respiratory failure with hypoxia: Secondary | ICD-10-CM

## 2020-05-24 DIAGNOSIS — J9602 Acute respiratory failure with hypercapnia: Secondary | ICD-10-CM

## 2020-05-24 LAB — GLUCOSE, CAPILLARY
Glucose-Capillary: 112 mg/dL — ABNORMAL HIGH (ref 70–99)
Glucose-Capillary: 120 mg/dL — ABNORMAL HIGH (ref 70–99)
Glucose-Capillary: 125 mg/dL — ABNORMAL HIGH (ref 70–99)
Glucose-Capillary: 131 mg/dL — ABNORMAL HIGH (ref 70–99)
Glucose-Capillary: 138 mg/dL — ABNORMAL HIGH (ref 70–99)
Glucose-Capillary: 140 mg/dL — ABNORMAL HIGH (ref 70–99)

## 2020-05-24 LAB — RENAL FUNCTION PANEL
Albumin: 2.3 g/dL — ABNORMAL LOW (ref 3.5–5.0)
Albumin: 2.4 g/dL — ABNORMAL LOW (ref 3.5–5.0)
Anion gap: 13 (ref 5–15)
Anion gap: 14 (ref 5–15)
BUN: 39 mg/dL — ABNORMAL HIGH (ref 8–23)
BUN: 35 mg/dL — ABNORMAL HIGH (ref 8–23)
CO2: 23 mmol/L (ref 22–32)
CO2: 22 mmol/L (ref 22–32)
Calcium: 8.3 mg/dL — ABNORMAL LOW (ref 8.9–10.3)
Calcium: 8.1 mg/dL — ABNORMAL LOW (ref 8.9–10.3)
Chloride: 102 mmol/L (ref 98–111)
Chloride: 101 mmol/L (ref 98–111)
Creatinine, Ser: 3.36 mg/dL — ABNORMAL HIGH (ref 0.61–1.24)
Creatinine, Ser: 3.16 mg/dL — ABNORMAL HIGH (ref 0.61–1.24)
GFR calc Af Amer: 20 mL/min — ABNORMAL LOW (ref >60)
GFR calc Af Amer: 22 mL/min — ABNORMAL LOW (ref >60)
GFR calc non Af Amer: 17 mL/min — ABNORMAL LOW (ref >60)
GFR calc non Af Amer: 19 mL/min — ABNORMAL LOW (ref >60)
Glucose, Bld: 134 mg/dL — ABNORMAL HIGH (ref 70–99)
Glucose, Bld: 144 mg/dL — ABNORMAL HIGH (ref 70–99)
Phosphorus: 3 mg/dL (ref 2.5–4.6)
Phosphorus: 3.1 mg/dL (ref 2.5–4.6)
Potassium: 4.2 mmol/L (ref 3.5–5.1)
Potassium: 4.1 mmol/L (ref 3.5–5.1)
Sodium: 138 mmol/L (ref 135–145)
Sodium: 137 mmol/L (ref 135–145)

## 2020-05-24 LAB — CBC
HCT: 23.9 % — ABNORMAL LOW (ref 39.0–52.0)
Hemoglobin: 7.6 g/dL — ABNORMAL LOW (ref 13.0–17.0)
MCH: 29.3 pg (ref 26.0–34.0)
MCHC: 31.8 g/dL (ref 30.0–36.0)
MCV: 92.3 fL (ref 80.0–100.0)
Platelets: 156 10*3/uL (ref 150–400)
RBC: 2.59 MIL/uL — ABNORMAL LOW (ref 4.22–5.81)
RDW: 16 % — ABNORMAL HIGH (ref 11.5–15.5)
WBC: 15.3 10*3/uL — ABNORMAL HIGH (ref 4.0–10.5)
nRBC: 0.7 % — ABNORMAL HIGH (ref 0.0–0.2)

## 2020-05-24 LAB — MAGNESIUM: Magnesium: 2.6 mg/dL — ABNORMAL HIGH (ref 1.7–2.4)

## 2020-05-24 MED ORDER — BUDESONIDE 0.5 MG/2ML IN SUSP
0.5000 mg | Freq: Two times a day (BID) | RESPIRATORY_TRACT | Status: DC
  Administered 2020-05-24 – 2020-06-10 (×30): 0.5 mg via RESPIRATORY_TRACT
  Filled 2020-05-24 (×34): qty 2

## 2020-05-24 MED ORDER — ACETAMINOPHEN 325 MG PO TABS
650.0000 mg | ORAL_TABLET | Freq: Four times a day (QID) | ORAL | Status: DC | PRN
  Administered 2020-06-02 – 2020-06-09 (×4): 650 mg via ORAL
  Filled 2020-05-24 (×4): qty 2

## 2020-05-24 MED ORDER — ASPIRIN 81 MG PO CHEW
81.0000 mg | CHEWABLE_TABLET | Freq: Every day | ORAL | Status: DC
  Administered 2020-05-26 – 2020-05-29 (×4): 81 mg via ORAL
  Filled 2020-05-24 (×4): qty 1

## 2020-05-24 MED ORDER — SODIUM CHLORIDE 0.9 % IV SOLN
125.0000 mg | Freq: Every day | INTRAVENOUS | Status: AC
  Administered 2020-05-24 – 2020-05-31 (×8): 125 mg via INTRAVENOUS
  Filled 2020-05-24 (×8): qty 10

## 2020-05-24 MED ORDER — ACETAMINOPHEN 650 MG RE SUPP: 650.0000 mg | RECTAL | Status: DC | PRN

## 2020-05-24 MED ORDER — SODIUM CHLORIDE 0.9 % IV SOLN
INTRAVENOUS | Status: DC | PRN
  Administered 2020-05-24 – 2020-05-27 (×2): 250 mL via INTRAVENOUS

## 2020-05-24 MED ORDER — FAMOTIDINE IN NACL 20-0.9 MG/50ML-% IV SOLN
20.0000 mg | Freq: Two times a day (BID) | INTRAVENOUS | Status: DC
  Administered 2020-05-24: 20 mg via INTRAVENOUS
  Filled 2020-05-24: qty 50

## 2020-05-24 MED ORDER — ARFORMOTEROL TARTRATE 15 MCG/2ML IN NEBU
15.0000 ug | INHALATION_SOLUTION | Freq: Two times a day (BID) | RESPIRATORY_TRACT | Status: DC
  Administered 2020-05-24 – 2020-06-10 (×30): 15 ug via RESPIRATORY_TRACT
  Filled 2020-05-24 (×36): qty 2

## 2020-05-24 MED ORDER — FAMOTIDINE IN NACL 20-0.9 MG/50ML-% IV SOLN
20.0000 mg | INTRAVENOUS | Status: DC
  Administered 2020-05-25: 20 mg via INTRAVENOUS
  Filled 2020-05-24: qty 50

## 2020-05-24 MED ORDER — ORAL CARE MOUTH RINSE
15.0000 mL | Freq: Two times a day (BID) | OROMUCOSAL | Status: DC
  Administered 2020-05-24 – 2020-06-09 (×30): 15 mL via OROMUCOSAL

## 2020-05-24 MED ORDER — DARBEPOETIN ALFA 60 MCG/0.3ML IJ SOSY
60.0000 ug | PREFILLED_SYRINGE | INTRAMUSCULAR | Status: DC
  Administered 2020-05-24 – 2020-05-31 (×2): 60 ug via SUBCUTANEOUS
  Filled 2020-05-24 (×2): qty 0.3

## 2020-05-24 NOTE — Progress Notes (Signed)
NAME:  Todd Robertson, MRN:  [id number], DOB:  1945-11-14, LOS: 54 ADMISSION DATE:  05/16/2020, CONSULTATION DATE:  05/16/2020 REFERRING MD:  Dr Alyson Ingles, CHIEF COMPLAINT:  Shock   Brief History   74 yo male former smoker presented on 7/09 for Rt nephrectomy in setting of renal cell carcinoma.  Post op developed hypotension and PCCM consulted.  Past Medical History  OA, Prostate cancer, Cardiomyopathy, CKD, DM 2, Gout, HTN, Asthma  Significant Hospital Events   7/09 Rt nephrectomy 7/13 Rt IJ HD catheter by IR; worsening hypercapnia and AMS >> intubated, start ABx for HCAP 7/14 start CRRT   Consults:  Nephrology  Procedures:  Rt IJ HD catheter 7/13 >> ETT 7/13 >> 7/17 Rt radial a line 7/15 >   Significant Diagnostic Tests:   CT abd/pelvis 7/09 >> s/p Rt nephrectomy.  Large volume of Rt retroperitoneal hemorrhage, moderate subcutaneous emphysema Rt abdominal wall  CT abd/pelvis 7/15 >> decreasing postoperative hemorrhage and subcutaneous emphysema, patchy consolidation b/l  Micro Data:  MRSA screen 7/09 >> negative Sputum 7/15 >> normal flora Blood 7/14 >>  Antimicrobials:  Cefepime 7/13 >> Vancomycin 7/13 >> 7/17  Interim history/subjective:  Remains on CRRT.  On pressure support.  Objective   Blood pressure (!) 117/50, pulse (!) 101, temperature 98.2 F (36.8 C), temperature source Axillary, resp. rate 18, height 6\' 2"  (1.88 m), weight 126.7 kg, SpO2 98 %.    Vent Mode: PSV;CPAP FiO2 (%):  [30 %] 30 % Set Rate:  [24 bmp] 24 bmp Vt Set:  [600 mL-650 mL] 650 mL PEEP:  [5 cmH20] 5 cmH20 Pressure Support:  [5 cmH20] 5 cmH20 Plateau Pressure:  [20 cmH20-22 cmH20] 22 cmH20   Intake/Output Summary (Last 24 hours) at 05/24/2020 0838 Last data filed at 05/24/2020 0800 Gross per 24 hour  Intake 1174.06 ml  Output 5135 ml  Net -3960.94 ml   Filed Weights   05/22/20 0500 05/23/20 0500 05/24/20 0300  Weight: 133 kg 133 kg 126.7 kg    Examination:  General -  alert Eyes - pupils reactive ENT - ETT in place Cardiac - regular rate/rhythm, no murmur Chest - scattered rhonchi Abdomen - soft, non tender, + bowel sounds Extremities - 1+ edema Skin - no rashes Neuro - follows commands, moves extremities   Resolved Hospital Problem list   Septic shock  Assessment & Plan:   Acute hypoxic/hypercapnic respiratory failure in setting of HCAP and renal failure. Asthma with hx of tobacco abuse. - extubation trial 7/17 - goal SpO2 > 92% - bronchial hygiene - f/u CXR intermittently - change to pulmicort/brovana nebulizer for now  HCAP. - day 5 of ABx; continue cefepime and d/c vancomycin  Acute metabolic encephalopathy 2nd to hypercapnia and renal failure. - improved - monitor mental status after extubation  AKI from ATN in setting of septic shock. CKD 3. - CRRT per nephrology  S/p Rt nephrectomy for renal cell carcinoma. - post op care per urology  Anemia from acute blood loss after surgery and chronic disease. - f/u CBC - transfuse for Hb < 7 or significant bleeding  DM type 2 poorly controlled with hyperglycemia. - SSI  Chronic combined CHF, HLD. - continue ASA - hold outpt lipitor, lotensin, bumex, coreg  Hx of gout. - hold outpt allopurinol    Best practice:  Diet: NPO DVT prophylaxis: SCD GI prophylaxis: pepcid Mobility: bedrest Code Status: full Disposition: ICU  Labs    CMP Latest Ref Rng & Units 05/24/2020 05/23/2020 05/23/2020  Glucose 70 - 99 mg/dL 134(H) 149(H) -  BUN 8 - 23 mg/dL 39(H) 45(H) -  Creatinine 0.61 - 1.24 mg/dL 3.36(H) 3.55(H) -  Sodium 135 - 145 mmol/L 138 135 135  Potassium 3.5 - 5.1 mmol/L 4.2 4.1 3.9  Chloride 98 - 111 mmol/L 102 100 -  CO2 22 - 32 mmol/L 23 21(L) -  Calcium 8.9 - 10.3 mg/dL 8.3(L) 8.0(L) -  Total Protein 6.5 - 8.1 g/dL - - -  Total Bilirubin 0.3 - 1.2 mg/dL - - -  Alkaline Phos 38 - 126 U/L - - -  AST 15 - 41 U/L - - -  ALT 0 - 44 U/L - - -    CBC Latest Ref Rng &  Units 05/24/2020 05/23/2020 05/23/2020  WBC 4.0 - 10.5 K/uL 15.3(H) 14.1(H) -  Hemoglobin 13.0 - 17.0 g/dL 7.6(L) 7.1(L) 7.1(L)  Hematocrit 39 - 52 % 23.9(L) 22.7(L) 21.0(L)  Platelets 150 - 400 K/uL 156 137(L) -    ABG    Component Value Date/Time   PHART 7.409 05/23/2020 0400   PCO2ART 38.5 05/23/2020 0400   PO2ART 85 05/23/2020 0400   HCO3 24.4 05/23/2020 0400   TCO2 26 05/23/2020 0400   ACIDBASEDEF 1.0 05/20/2020 2308   O2SAT 97.0 05/23/2020 0400    CBG (last 3)  Recent Labs    05/23/20 2303 05/24/20 0303 05/24/20 0701  GLUCAP 113* 125* 120*    Critical care time: 42 minutes  Chesley Mires, MD [place] Pager - [phone] 05/24/2020, 8:53 AM

## 2020-05-24 NOTE — Progress Notes (Signed)
Pharmacy Antibiotic Note  Todd Robertson is a 74 y.o. male admitted on 05/16/2020 with renal cell carcinoma s/p radical nephrectomy.  Pharmacy has been consulted for cefepime dosing for sepsis.  Renal plans for CRRT for another 24 hrs, then transition to iHD.  Afebrile, WBC up to 15.3.  Plan: Continue cefepime 2gm IV Q12H Monitor CRRT duration, clinical progress  Height: 6\' 2"  (188 cm) Weight: 126.7 kg (279 lb 5.2 oz) IBW/kg (Calculated) : 82.2  Temp (24hrs), Avg:98.5 F (36.9 C), Min:98.2 F (36.8 C), Max:98.8 F (37.1 C)  Recent Labs  Lab 05/22/20 1622 05/22/20 1630 05/22/20 1726 05/22/20 1727 05/22/20 1812 05/23/20 0200 05/23/20 1607 05/24/20 0315  WBC 13.6*  --   --   --  13.5* 14.1* 14.1* 15.3*  CREATININE  --    < > 5.23* 5.50*  --  4.37* 3.55* 3.36*   < > = values in this interval not displayed.    Estimated Creatinine Clearance: 28.1 mL/min (A) (by C-G formula based on SCr of 3.36 mg/dL (H)).    No Known Allergies  Vanc 7/14 >> 7/17 Cefepime 7/14 >>  7/9 mrsa pcr - neg 7/14 TA - rare budding yeast 7/14 BCx - NGTD  Todd Robertson D. Mina Marble, PharmD, BCPS, [place] 05/24/2020, 1:20 PM

## 2020-05-24 NOTE — Progress Notes (Signed)
[facility] Progress Note  Interval events/Subjective:   Tolerating UF, 4.5L negative yesterday  Frequent clotting, not on circuit / systemic AC  Off pressors  Weening well, might extubate  No sig UOP  K 4.2 and P 3.0  Vitals:   05/24/20 0915 05/24/20 0930 05/24/20 0945 05/24/20 1000  BP:      Pulse: 95 86 82 90  Resp: 20 18 17 19   Temp:      TempSrc:      SpO2: 98% 100% 100% 100%  Weight:      Height:        Exam: Gen large-framed AAM intubated and sedated No jvd or bruits Chest clear ant, dec BS bases RRR no MRG Abd marked abd obesity, lap wounds in R abdomen, distended - seems a bit more so now, nontender; RN has marked areas of ecchymoses on abd and thigh which appear to be expanding MS no joint effusions or deformity Ext 1+ bilat UE edema, no sig LE edema Neuro sedated R TEMP HD cath     Home meds:  - asa 97m/ lipitor 40 hs/ benazepril 20 am/ bumex 122mqd/ coreg 25 bid  - allopurinol 300 hs  - symbicort ACT 2 puff bid  - prn's/ vitamins/ supplements  Date                             Creat               egFR   2018                          1.79- 2.38        30- 43   2019                          1.80- 1.81        42-43   January 12, 2020           3.00                    May 13, 2020                2.60                 27   Jul 9                           2.51                 29- 33   Jul 10                         3.42, 3.89    UA none yet   CT abd wo contrast 7/9 - Adrenals/Urinary Tract: Post right nephrectomy. Large volume of hemorrhage in the right nephrectomy bed. Ill-defined air fluid collection tracks in the right retroperitoneum anterior to the iliopsoas muscle and laterally in the abdominal cavity. Hemorrhage tracks into the right aspect of the pelvis in the is only partially included in the field of view. There is moderate subcutaneous emphysema involving the right abdominal wall. Small foci of air dissects between the right  abdominal wall musculature. Simple cyst in the left kidney. There is no left hydronephrosis.     Assessment/ Plan: 1. AoCKD  IV - b/l creat 2.60, eGFR 27 then s/p nephrectomy with resultant progressive renal insufficiency and oliguria.  HD catheter placed IR 7/13 - not functional so replaced bedside 7/15 by PCCM.  Now running CRRT.  BID labs. 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  Will need transition to tunnel cath at some point.  Plan for CRRT with more UF for another 24h and then likely transition to iHD.  2. Renal mass - sp R nephrectomy 05/16/20 3. Anemia ckd / abl - Hb down 12 to 7, transfusion per primary.  CT 7/9 with hemorrhage in nephrectomy bed - Transfuse per primary. Iron indices show tsat 10%.  Holding IV iron in setting of septic shock.  Margins were negative, will star ESA: aranesp 100 qSat.  Start IV Fe load -- TSAT 10% 7/13 4. Shock, presumed septic - weening pressors and broad spectrum abx per primary.  Cultures and abd imaging pending. 5. Hyponatremia - resolved 6. Hyperkalemia - resolved,all 4K baths  Rexene Agent  [facility]   Recent Labs  Lab 05/23/20 1607 05/24/20 0315  K 4.1 4.2  BUN 45* 39*  CREATININE 3.55* 3.36*  CALCIUM 8.0* 8.3*  PHOS 3.2 3.0  HGB 7.1* 7.6*   Inpatient medications:  arformoterol  15 mcg Nebulization BID   aspirin  81 mg Oral Daily   budesonide (PULMICORT) nebulizer solution  0.5 mg Nebulization BID   chlorhexidine gluconate (MEDLINE KIT)  15 mL Mouth Rinse BID   Chlorhexidine Gluconate Cloth  6 each Topical Daily   insulin aspart  0-9 Units Subcutaneous Q4H   mouth rinse  15 mL Mouth Rinse 10 times per day   polyethylene glycol  17 g Per Tube Daily   sennosides  5 mL Per Tube BID   sodium chloride flush  10-40 mL Intracatheter Q12H     prismasol BGK 4/2.5 1,000 mL/hr at 05/24/20 0847    prismasol BGK 4/2.5 200 mL/hr at 05/23/20 1113   sodium chloride Stopped (05/24/20 0956)   ceFEPime (MAXIPIME) IV 200 mL/hr at 05/24/20  1000   famotidine (PEPCID) IV Stopped (05/24/20 0932)   prismasol BGK 4/2.5 1,500 mL/hr at 05/24/20 0752   sodium chloride, acetaminophen **OR** acetaminophen, albuterol, alum & mag hydroxide-simeth, diphenhydrAMINE **OR** diphenhydrAMINE, heparin, menthol-cetylpyridinium, ondansetron, oxyCODONE, promethazine, sodium chloride, sodium chloride flush, tiZANidine

## 2020-05-24 NOTE — Procedures (Signed)
Extubation Procedure Note  Patient Details:   Name: Todd Robertson DOB: [birth year] MRN: [id number]   Airway Documentation:    Vent end date: 05/24/20 Vent end time: 0914   Evaluation  O2 sats: stable throughout Complications: No apparent complications Patient did tolerate procedure well. Bilateral Breath Sounds: Diminished  Patient able to speak: Yes  Rudene Re 05/24/2020, 9:15 AM

## 2020-05-24 NOTE — Progress Notes (Signed)
8 Days Post-Op   Subjective/Chief Complaint:  1 - RIGHT Renal Neoplasm - s/p right robotic radical nephrectomy 05/16/20 for large Rt mass. Pathology papillary renal cell, margins negative   2 - Acute on Chronic Renal Failure / Solitary Left Kidney - baseline Cr 2's. S/p right nephrectomy 7/9. Initially near anuric POD 0. CT 7/9 PM unremarkable left (solitary) kidney. CRRT initiated 7/14, 4L removed yesterday.    3- Acute Blood Loss Anemia - Hgb 8s pos-op (after 3L fluid). Downtrended to 7, has received 3 u pRBC. Initial CT with expected post-op changes and hematoma in nephrectomy bed, resolving on repeat CT scan. Low concern for persistent bleeding. Hb stable today.   4 - Uremic encephalopathy- Patient remains intubated and sedated  5 - Fever / Multifocal PNA - Patient febrile to 102 7/13, blood and bronchial cultures collected, no growth. CT and CXR concerning for multifocal PNA. Extubated yesterday  `  Objective: Vital signs in last 24 hours: Temp:  [97.8 F (36.6 C)-98.8 F (37.1 C)] 97.8 F (36.6 C) (07/17 1930) Pulse Rate:  [37-103] 83 (07/17 2200) Resp:  [16-25] 17 (07/17 2200) SpO2:  [96 %-100 %] 100 % (07/17 2200) Arterial Line BP: (95-175)/(45-62) 155/62 (07/17 2200) FiO2 (%):  [30 %] 30 % (07/17 0722) Weight:  [126.7 kg] 126.7 kg (07/17 0300) Last BM Date: 05/19/20  Intake/Output from previous day: 07/16 0701 - 07/17 0700 In: 1193.7 [P.O.:60; I.V.:434.6; NG/GT:240; IV Piggyback:449.1] Out: 9563 [Urine:30; Emesis/NG output:1800] Intake/Output this shift: Total I/O In: 125 [I.V.:25; IV Piggyback:100] Out: 504 [Other:504]  General appearance: Intubated, sedated Resp: mechanically ventilated Cardio: HR 70s on monitor, regular.  GU: foley in place with clear yellow urine in bag Extremities: diffusely edematous Skin: Skin color, texture, turgor normal. No rashes or lesions  Abd: Obese, distended, tympantic. Large right flank ecchymosis.   Lab Results:  Recent Labs     05/23/20 1607 05/24/20 0315  WBC 14.1* 15.3*  HGB 7.1* 7.6*  HCT 22.7* 23.9*  PLT 137* 156   BMET Recent Labs    05/24/20 0315 05/24/20 1633  NA 138 137  K 4.2 4.1  CL 102 101  CO2 23 22  GLUCOSE 134* 144*  BUN 39* 35*  CREATININE 3.36* 3.16*  CALCIUM 8.3* 8.1*   PT/INR No results for input(s): LABPROT, INR in the last 72 hours. ABG Recent Labs    05/23/20 0400  PHART 7.409  HCO3 24.4    Studies/Results: DG Chest Port 1 View  Result Date: 05/23/2020 CLINICAL DATA:  74 year old male with history of ventilator dependency. Abdominal distension. EXAM: PORTABLE CHEST 1 VIEW COMPARISON:  Chest x-ray 05/21/2020. FINDINGS: An endotracheal tube is in place with tip 4.2 cm above the carina. There is a right-sided internal jugular central venous catheter with tip terminating in the mid superior vena cava. A nasogastric tube is seen extending into the stomach, however, the tip of the nasogastric tube extends below the lower margin of the image. Patchy multifocal airspace consolidation throughout the mid to lower lungs bilaterally (right greater than left), similar to the prior study. Small bilateral pleural effusions. No pneumothorax. No evidence of pulmonary edema. Heart size is normal. Upper mediastinal contours are within normal limits. Aortic atherosclerosis. IMPRESSION: 1. Support apparatus, as above. 2. Multilobar bilateral pneumonia redemonstrated. 3. Small bilateral pleural effusions. 4. Aortic atherosclerosis. Electronically Signed   By: Vinnie Langton M.D.   On: 05/23/2020 08:01   DG Abd Portable 1V  Result Date: 05/23/2020 CLINICAL DATA:  Abdominal distention EXAM:  PORTABLE ABDOMEN - 1 VIEW COMPARISON:  CT from yesterday FINDINGS: Moderate gaseous distention of the stomach. Diffuse gaseous distension of bowel. Mottled lucency over the right abdomen which is subcutaneous by CT. Infiltrates at the lung bases. Enteric tube with tip and side-port over the stomach. IMPRESSION: 1.  More gaseous distension of bowel, possible ileus. 2. The enteric tube remains in good position; the stomach is still moderately distended. 3. Infiltrates at the lung bases as seen on CT yesterday. Electronically Signed   By: Monte Fantasia M.D.   On: 05/23/2020 08:17    Anti-infectives: Anti-infectives (From admission, onward)    Start     Dose/Rate Route Frequency Ordered Stop   05/22/20 1200  vancomycin (VANCOREADY) IVPB 1250 mg/250 mL  Status:  Discontinued        1,250 mg 166.7 mL/hr over 90 Minutes Intravenous Every 24 hours 05/21/20 1347 05/24/20 0855   05/22/20 0400  vancomycin (VANCOREADY) IVPB 1250 mg/250 mL  Status:  Discontinued        1,250 mg 166.7 mL/hr over 90 Minutes Intravenous Every 24 hours 05/21/20 0218 05/21/20 1201   05/21/20 1201  vancomycin variable dose per unstable renal function (pharmacist dosing)  Status:  Discontinued         Does not apply See admin instructions 05/21/20 1201 05/22/20 0708   05/21/20 0315  vancomycin (VANCOCIN) 2,500 mg in sodium chloride 0.9 % 500 mL IVPB        2,500 mg 250 mL/hr over 120 Minutes Intravenous  Once 05/21/20 0218 05/21/20 0540   05/21/20 0315  ceFEPIme (MAXIPIME) 2 g in sodium chloride 0.9 % 100 mL IVPB     Discontinue     2 g 200 mL/hr over 30 Minutes Intravenous Every 12 hours 05/21/20 0218     05/16/20 1700  ceFAZolin (ANCEF) IVPB 1 g/50 mL premix        1 g 100 mL/hr over 30 Minutes Intravenous Every 8 hours 05/16/20 1600 05/17/20 0206   05/16/20 0600  ceFAZolin (ANCEF) 3 g in dextrose 5 % 50 mL IVPB        3 g 100 mL/hr over 30 Minutes Intravenous 30 min pre-op 05/15/20 0725 05/16/20 1944       Assessment/Plan:  1 - RIGHT Renal Neoplasm - s/p nephrectomy. no further cancer directed therapy this admission.    2 - Acute on Chronic Renal Failure / Solitary Left Kidney - continues on CRRT with 4L off yesterday. Appreciate recommendations of Nephrology, CCM. Likely transitioning to HD soon.   3 - Uremic  encephalopathy - secondary to above  4 - Fever / PNA - on broad spectrum antibiotics, management per ICU team    5- Acute Blood Loss Anemia - C No evidence of active post operative bleeding. Agree with PRN transfusion if needed from a cardiovascular/hemodynamic standpoint.   6- Post operative ileus - Continue NG tube,     Todd Robertson 05/24/2020

## 2020-05-25 DIAGNOSIS — J189 Pneumonia, unspecified organism: Secondary | ICD-10-CM

## 2020-05-25 LAB — GLUCOSE, CAPILLARY
Glucose-Capillary: 134 mg/dL — ABNORMAL HIGH (ref 70–99)
Glucose-Capillary: 125 mg/dL — ABNORMAL HIGH (ref 70–99)
Glucose-Capillary: 127 mg/dL — ABNORMAL HIGH (ref 70–99)
Glucose-Capillary: 122 mg/dL — ABNORMAL HIGH (ref 70–99)
Glucose-Capillary: 137 mg/dL — ABNORMAL HIGH (ref 70–99)

## 2020-05-25 LAB — RENAL FUNCTION PANEL
Albumin: 2.4 g/dL — ABNORMAL LOW (ref 3.5–5.0)
Anion gap: 13 (ref 5–15)
BUN: 31 mg/dL — ABNORMAL HIGH (ref 8–23)
CO2: 23 mmol/L (ref 22–32)
Calcium: 8.5 mg/dL — ABNORMAL LOW (ref 8.9–10.3)
Chloride: 102 mmol/L (ref 98–111)
Creatinine, Ser: 2.88 mg/dL — ABNORMAL HIGH (ref 0.61–1.24)
GFR calc Af Amer: 24 mL/min — ABNORMAL LOW (ref >60)
GFR calc non Af Amer: 21 mL/min — ABNORMAL LOW (ref >60)
Glucose, Bld: 133 mg/dL — ABNORMAL HIGH (ref 70–99)
Phosphorus: 2.3 mg/dL — ABNORMAL LOW (ref 2.5–4.6)
Potassium: 4.3 mmol/L (ref 3.5–5.1)
Sodium: 138 mmol/L (ref 135–145)

## 2020-05-25 LAB — CBC
HCT: 23.4 % — ABNORMAL LOW (ref 39.0–52.0)
Hemoglobin: 7.3 g/dL — ABNORMAL LOW (ref 13.0–17.0)
MCH: 29 pg (ref 26.0–34.0)
MCHC: 31.2 g/dL (ref 30.0–36.0)
MCV: 92.9 fL (ref 80.0–100.0)
Platelets: 157 10*3/uL (ref 150–400)
RBC: 2.52 MIL/uL — ABNORMAL LOW (ref 4.22–5.81)
RDW: 16.4 % — ABNORMAL HIGH (ref 11.5–15.5)
WBC: 16.7 10*3/uL — ABNORMAL HIGH (ref 4.0–10.5)
nRBC: 0.7 % — ABNORMAL HIGH (ref 0.0–0.2)

## 2020-05-25 LAB — MAGNESIUM: Magnesium: 2.6 mg/dL — ABNORMAL HIGH (ref 1.7–2.4)

## 2020-05-25 MED ORDER — CHLORHEXIDINE GLUCONATE 0.12 % MT SOLN
OROMUCOSAL | Status: AC
  Filled 2020-05-25: qty 15

## 2020-05-25 MED ORDER — SODIUM CHLORIDE 0.9 % IV SOLN
1.0000 g | INTRAVENOUS | Status: DC
  Administered 2020-05-26: 1 g via INTRAVENOUS
  Filled 2020-05-25 (×2): qty 1

## 2020-05-25 MED ORDER — NEPRO/CARBSTEADY PO LIQD
1000.0000 mL | ORAL | Status: DC
  Administered 2020-05-26: 1000 mL
  Filled 2020-05-25 (×2): qty 1000

## 2020-05-25 NOTE — Progress Notes (Signed)
NAME:  Todd Robertson, MRN:  [id number], DOB:  [birth year], LOS: 9 ADMISSION DATE:  05/16/2020, CONSULTATION DATE:  05/16/2020 REFERRING MD:  Dr Alyson Ingles, CHIEF COMPLAINT:  Shock   Brief History   74 yo male former smoker presented on 7/09 for Rt nephrectomy in setting of renal cell carcinoma.  Post op developed hypotension and PCCM consulted.  Past Medical History  OA, Prostate cancer, Cardiomyopathy, CKD, DM 2, Gout, HTN, Asthma  Significant Hospital Events   7/09 Rt nephrectomy 7/13 Rt IJ HD catheter by IR; worsening hypercapnia and AMS >> intubated, start ABx for HCAP 7/14 start CRRT 7/18 off CRRT  Consults:  Nephrology  Procedures:  Rt IJ HD catheter 7/13 >> ETT 7/13 >> 7/17 Rt radial a line 7/15 > 7/18  Significant Diagnostic Tests:   CT abd/pelvis 7/09 >> s/p Rt nephrectomy.  Large volume of Rt retroperitoneal hemorrhage, moderate subcutaneous emphysema Rt abdominal wall  CT abd/pelvis 7/15 >> decreasing postoperative hemorrhage and subcutaneous emphysema, patchy consolidation b/l  Micro Data:  MRSA screen 7/09 >> negative Sputum 7/15 >> normal flora Blood 7/14 >>  Antimicrobials:  Cefepime 7/13 >> Vancomycin 7/13 >> 7/17  Interim history/subjective:  Denies chest pain, dyspnea.  Feels bloat in abdomen.  Objective   Blood pressure (!) 117/50, pulse 81, temperature 98.3 F (36.8 C), temperature source Oral, resp. rate 19, height 6\' 2"  (1.88 m), weight 120.5 kg, SpO2 100 %.        Intake/Output Summary (Last 24 hours) at 05/25/2020 0925 Last data filed at 05/25/2020 0800 Gross per 24 hour  Intake 574.83 ml  Output 3542 ml  Net -2967.17 ml   Filed Weights   05/23/20 0500 05/24/20 0300 05/25/20 0400  Weight: 133 kg 126.7 kg 120.5 kg    Examination:  General - alert Eyes - pupils reactive ENT - no sinus tenderness, no stridor Cardiac - regular rate/rhythm, no murmur Chest - equal breath sounds b/l, no wheezing or rales Abdomen - soft, non tender, +  bowel sounds Extremities - 1+ edema Skin - no rashes Neuro - moves extremities, follows commands   Resolved Hospital Problem list   Septic shock, Acute metabolic encephalopathy 2nd to hypercapnia and renal failure  Assessment & Plan:   Acute hypoxic/hypercapnic respiratory failure in setting of HCAP and renal failure. Asthma with hx of tobacco abuse. - goal SpO2 > 92% - f/u CXR intermittently - continue pulmicort/brovana with prn albuterol for now  HCAP. - day 6 of cefepime  AKI from ATN in setting of septic shock. CKD 3. - transitioned off CRRT 7/18  S/p Rt nephrectomy for renal cell carcinoma. - post op care per urology  Anemia from acute blood loss after surgery and chronic disease. - f/u CBC - transfuse for Hb < 7 or significant bleeding  DM type 2 poorly controlled with hyperglycemia. - SSI  Chronic combined CHF, HLD. - continue ASA - hold outpt lipitor, lotensin, bumex, coreg  Hx of gout. - hold outpt allopurinol  Dysphagia. - had coughing during RN bedside swallow assessment - place cortrak for meds and tube feeds - speech therapy to assess  Constipation. - resume bowel regimen  Deconditioning. - PT/OT   Best practice:  Diet: tube feeds DVT prophylaxis: SCD GI prophylaxis: pepcid Mobility: bedrest Code Status: full Disposition: ICU  Labs    CMP Latest Ref Rng & Units 05/25/2020 05/24/2020 05/24/2020  Glucose 70 - 99 mg/dL 133(H) 144(H) 134(H)  BUN 8 - 23 mg/dL 31(H) 35(H) 39(H)  Creatinine  0.61 - 1.24 mg/dL 2.88(H) 3.16(H) 3.36(H)  Sodium 135 - 145 mmol/L 138 137 138  Potassium 3.5 - 5.1 mmol/L 4.3 4.1 4.2  Chloride 98 - 111 mmol/L 102 101 102  CO2 22 - 32 mmol/L 23 22 23   Calcium 8.9 - 10.3 mg/dL 8.5(L) 8.1(L) 8.3(L)  Total Protein 6.5 - 8.1 g/dL - - -  Total Bilirubin 0.3 - 1.2 mg/dL - - -  Alkaline Phos 38 - 126 U/L - - -  AST 15 - 41 U/L - - -  ALT 0 - 44 U/L - - -    CBC Latest Ref Rng & Units 05/25/2020 05/24/2020 05/23/2020  WBC  4.0 - 10.5 K/uL 16.7(H) 15.3(H) 14.1(H)  Hemoglobin 13.0 - 17.0 g/dL 7.3(L) 7.6(L) 7.1(L)  Hematocrit 39 - 52 % 23.4(L) 23.9(L) 22.7(L)  Platelets 150 - 400 K/uL 157 156 137(L)    ABG    Component Value Date/Time   PHART 7.409 05/23/2020 0400   PCO2ART 38.5 05/23/2020 0400   PO2ART 85 05/23/2020 0400   HCO3 24.4 05/23/2020 0400   TCO2 26 05/23/2020 0400   ACIDBASEDEF 1.0 05/20/2020 2308   O2SAT 97.0 05/23/2020 0400    CBG (last 3)  Recent Labs    05/24/20 2344 05/25/20 0329 05/25/20 0706  GLUCAP 112* 134* 125*    Signature:  Chesley Mires, MD [place] Pager - [phone] 05/25/2020, 9:25 AM

## 2020-05-25 NOTE — Progress Notes (Signed)
Pharmacy Antibiotic Note  Todd Robertson is a 74 y.o. male admitted on 05/16/2020 with renal cell carcinoma s/p radical nephrectomy.  Pharmacy has been consulted for cefepime dosing for sepsis.    CRRT off this morning and patient received AM dose of cefepime.  Afebrile, WBC up 16.7.  Plan: Change cefepime to 1gm IV Q24H - start 7/19  Monitor renal fxn / iHD plans, clinical progress  Height: 6\' 2"  (188 cm) Weight: 120.5 kg (265 lb 10.5 oz) IBW/kg (Calculated) : 82.2  Temp (24hrs), Avg:98.5 F (36.9 C), Min:97.8 F (36.6 C), Max:99.2 F (37.3 C)  Recent Labs  Lab 05/22/20 1727 05/22/20 1812 05/23/20 0200 05/23/20 1607 05/24/20 0315 05/24/20 1633 05/25/20 0330  WBC  --  13.5* 14.1* 14.1* 15.3*  --  16.7*  CREATININE   < >  --  4.37* 3.55* 3.36* 3.16* 2.88*   < > = values in this interval not displayed.    Estimated Creatinine Clearance: 32 mL/min (A) (by C-G formula based on SCr of 2.88 mg/dL (H)).    No Known Allergies  Vanc 7/14 >> 7/17 Cefepime 7/14 >>  7/9 mrsa pcr - neg 7/14 TA - rare budding yeast 7/14 BCx - NGTD  Todd Robertson, PharmD, BCPS, [place] 05/25/2020, 1:41 PM

## 2020-05-25 NOTE — Evaluation (Signed)
Clinical/Bedside Swallow Evaluation Patient Details  Name: Todd Robertson MRN: [id number] Date of Birth: [birth year]  Today's Date: 05/25/2020 Time: SLP Start Time (ACUTE ONLY): 1200 SLP Stop Time (ACUTE ONLY): 1215 SLP Time Calculation (min) (ACUTE ONLY): 15 min  Past Medical History:  Past Medical History:  Diagnosis Date  . Arthritis   . Cancer [facility])    prostate  . Cardiomyopathy    secondary  . CKD (chronic kidney disease)   . DM2 (diabetes mellitus, type 2) ([place])   . Gout   . HTN (hypertension)    unspec  . Hypercholesterolemia   . Nonischemic cardiomyopathy ([place])    a. EF 40-50% by most recent 2-D echo b. EF 25% by cardiac cath in 2004  . Overweight(278.02)    Past Surgical History:  Past Surgical History:  Procedure Laterality Date  . COLONOSCOPY    . HERNIA REPAIR    . IR FLUORO GUIDE CV LINE RIGHT  05/20/2020  . IR US GUIDE VASC ACCESS RIGHT  05/20/2020  . KNEE ARTHROPLASTY Left 08/08/2017   Procedure: LEFT TOTAL KNEE ARTHROPLASTY WITH COMPUTER NAVIGATION;  Surgeon: Rod Can, MD;  Location: [facility];  Service: Orthopedics;  Laterality: Left;  Needs RNFA  . KNEE ARTHROPLASTY Right 11/17/2017   Procedure: RIGHT TOTAL KNEE ARTHROPLASTY WITH COMPUTER NAVIGATION;  Surgeon: Rod Can, MD;  Location: [facility];  Service: Orthopedics;  Laterality: Right;  NEEDS RNFA  . ROBOT ASSISTED LAPAROSCOPIC NEPHRECTOMY Right 05/16/2020   Procedure: XI ROBOTIC ASSISTED LAPAROSCOPIC NEPHRECTOMY;  Surgeon: Cleon Gustin, MD;  Location: [facility];  Service: Urology;  Laterality: Right;  2.5 hrs   HPI:  74 yo male former smoker presented on 7/09 for Rt nephrectomy in setting of renal cell carcinoma.  Post op developed hypotension. cough with water during yale after extubation; 7/13-7/17.    Assessment / Plan / Recommendation Clinical Impression  Pt demonstates consistent, immediate cough/throat clearing after large volume sips of thin liquids. Cough is weak and congested. Nectar  thick liquids still result in some throat clearing. Pt can tolerate puree and ice over the next day for oral medication and comfort. Will f/u tomorrow for potential diet advancement vs need for instrumental assessment. RN reports there is a plan for [employer].       Aspiration Risk       Diet Recommendation NPO;Ice chips PRN after oral care;NPO except meds   Medication Administration: Crushed with puree    Other  Recommendations Oral Care Recommendations: Oral care BID   Follow up Recommendations Skilled Nursing facility      Frequency and Duration min 2x/week  2 weeks       Prognosis        Swallow Study   General HPI: 74 yo male former smoker presented on 7/09 for Rt nephrectomy in setting of renal cell carcinoma.  Post op developed hypotension. cough with water during yale after extubation; 7/13-7/17.  Type of Study: Bedside Swallow Evaluation Previous Swallow Assessment: none Diet Prior to this Study: NPO Temperature Spikes Noted: No Respiratory Status: Room air History of Recent Intubation: Yes Length of Intubations (days): 5 days Date extubated: 05/24/20 Behavior/Cognition: Cooperative;Confused;Lethargic/Drowsy Oral Cavity Assessment: Within Functional Limits Oral Care Completed by SLP: No Oral Cavity - Dentition: Adequate natural dentition Vision: Functional for self-feeding Self-Feeding Abilities: Needs assist Patient Positioning: Upright in bed Baseline Vocal Quality: Hoarse Volitional Cough: Weak Volitional Swallow: Able to elicit    Oral/Motor/Sensory Function Overall Oral Motor/Sensory Function: Within functional limits  Ice Chips     Thin Liquid Thin Liquid: Impaired Presentation: Straw;Cup Pharyngeal  Phase Impairments: Throat Clearing - Immediate;Cough - Immediate    Nectar Thick Nectar Thick Liquid: Impaired Presentation: Cup;Straw Pharyngeal Phase Impairments: Throat Clearing - Immediate   Honey Thick Honey Thick Liquid: Not tested    Puree Puree: Within functional limits   Solid     Solid: Not tested     Herbie Baltimore, MA [place] Pager [phone] Office [phone]  Lynann Beaver 05/25/2020,2:07 PM

## 2020-05-25 NOTE — Progress Notes (Signed)
[facility] Progress Note  Interval events/Subjective:   CRRT held this AM  Extubated, off pressors, good BP.    AM labs stable, K and P ok  No sig UOP  Vitals:   05/25/20 0714 05/25/20 0800 05/25/20 0900 05/25/20 1000  BP:      Pulse:  81 84 87  Resp:  19 20 (!) 24  Temp:      TempSrc:      SpO2: 100% 100% 100% 100%  Weight:      Height:        Exam: NAD Chest clear ant, dec BS bases RRR no MRG Abd marked abd obesity, lap wounds in R abdomen, distended - seems a bit more so now, nontender; RN has marked areas of ecchymoses on abd and thigh which appear to be expanding MS no joint effusions or deformity Ext 1+ bilat UE edema, no sig LE edema Neuro sedated R TEMP HD cath     Home meds:  - asa '81mg'$ / lipitor 40 hs/ benazepril 20 am/ bumex '1mg'$  qd/ coreg 25 bid  - allopurinol 300 hs  - symbicort ACT 2 puff bid  - prn's/ vitamins/ supplements  Date                             Creat               egFR   2018                          1.79- 2.38        30- 43   2019                          1.80- 1.81        42-43   January 12, 2020           3.00                    May 13, 2020                2.60                 27   Jul 9                           2.51                 29- 33   Jul 10                         3.42, 3.89    UA none yet   CT abd wo contrast 7/9 - Adrenals/Urinary Tract: Post right nephrectomy. Large volume of hemorrhage in the right nephrectomy bed. Ill-defined air fluid collection tracks in the right retroperitoneum anterior to the iliopsoas muscle and laterally in the abdominal cavity. Hemorrhage tracks into the right aspect of the pelvis in the is only partially included in the field of view. There is moderate subcutaneous emphysema involving the right abdominal wall. Small foci of air dissects between the right abdominal wall musculature. Simple cyst in the left kidney. There is no left hydronephrosis.     Assessment/  Plan: 1. Dialysis dependent AoCKD IV - b/l creat 2.60, eGFR 27 then s/p nephrectomy with resultant progressive renal  insufficiency and oliguria.  HD catheter placed IR 7/13 - not functional so replaced bedside 7/15 by PCCM.  Off CRRT 7/18. 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  Will need transition to tunnel cath at some point.  Keep off CRRT, move to iHD tomorrow: 2K, Temp HD cath, no heparin, 3.5h, 2-3L UF. 2. Renal mass - sp R nephrectomy 05/16/20; neg margins on path 3. Anemia ckd / abl - Hb down 12 to 7, transfusion per primary.  CT 7/9 with hemorrhage in nephrectomy bed - Transfuse per primary. Iron indices show tsat 10%.  Holding IV iron in setting of septic shock.  Margins were negative, started ESA: aranesp 100 qSat.  Started IV Fe load -- TSAT 10% 7/13 4. Shock, presumed septic resolved 5. Hyponatremia - resolved 6. Hyperkalemia - resolved,  [facility]   Recent Labs  Lab 05/24/20 0315 05/24/20 0315 05/24/20 1633 05/25/20 0330  K 4.2   < > 4.1 4.3  BUN 39*   < > 35* 31*  CREATININE 3.36*   < > 3.16* 2.88*  CALCIUM 8.3*   < > 8.1* 8.5*  PHOS 3.0   < > 3.1 2.3*  HGB 7.6*  --   --  7.3*   < > = values in this interval not displayed.   Inpatient medications: . arformoterol  15 mcg Nebulization BID  . aspirin  81 mg Oral Daily  . budesonide (PULMICORT) nebulizer solution  0.5 mg Nebulization BID  . chlorhexidine gluconate (MEDLINE KIT)  15 mL Mouth Rinse BID  . Chlorhexidine Gluconate Cloth  6 each Topical Daily  . darbepoetin (ARANESP) injection - NON-DIALYSIS  60 mcg Subcutaneous Q Sat-1800  . feeding supplement (NEPRO CARB STEADY)  1,000 mL Per Tube Q24H  . insulin aspart  0-9 Units Subcutaneous Q4H  . mouth rinse  15 mL Mouth Rinse q12n4p  . polyethylene glycol  17 g Per Tube Daily  . sennosides  5 mL Per Tube BID  . sodium chloride flush  10-40 mL Intracatheter Q12H   .  prismasol BGK 4/2.5 1,000 mL/hr at 05/25/20 0618  .  prismasol BGK 4/2.5 200 mL/hr at  05/25/20 0103  . sodium chloride Stopped (05/25/20 0933)  . ceFEPime (MAXIPIME) IV 200 mL/hr at 05/25/20 1000  . famotidine (PEPCID) IV Stopped (05/25/20 0850)  . ferric gluconate (FERRLECIT/NULECIT) IV 125 mg (05/25/20 1030)  . prismasol BGK 4/2.5 1,500 mL/hr at 05/25/20 0429   sodium chloride, acetaminophen **OR** acetaminophen, albuterol, alum & mag hydroxide-simeth, diphenhydrAMINE **OR** diphenhydrAMINE, heparin, menthol-cetylpyridinium, ondansetron, oxyCODONE, promethazine, sodium chloride, sodium chloride flush, tiZANidine

## 2020-05-25 NOTE — Progress Notes (Signed)
9 Days Post-Op   Subjective/Chief Complaint:  Patient doing well this morning. 3.8L removed yesterday. Hgb 7.3 breathing improved  `  Objective: Vital signs in last 24 hours: Temp:  [97.8 F (36.6 C)-98.8 F (37.1 C)] 98.3 F (36.8 C) (07/18 0700) Pulse Rate:  [37-103] 81 (07/18 0800) Resp:  [16-25] 19 (07/18 0800) SpO2:  [96 %-100 %] 100 % (07/18 0800) Arterial Line BP: (95-175)/(45-63) 136/53 (07/18 0800) Weight:  [120.5 kg] 120.5 kg (07/18 0400) Last BM Date: 05/19/20  Intake/Output from previous day: 07/17 0701 - 07/18 0700 In: 568.3 [I.V.:208.1; IV Piggyback:360.2] Out: 7026 [Emesis/NG output:400] Intake/Output this shift: Total I/O In: 10 [I.V.:10] Out: -   General appearance: Intubated, sedated Resp: mechanically ventilated Cardio: HR 70s on monitor, regular.  GU: foley in place with clear yellow urine in bag Extremities: diffusely edematous Skin: Skin color, texture, turgor normal. No rashes or lesions  Abd: Obese, distended, tympantic. Large right flank ecchymosis.   Lab Results:  Recent Labs    05/24/20 0315 05/25/20 0330  WBC 15.3* 16.7*  HGB 7.6* 7.3*  HCT 23.9* 23.4*  PLT 156 157   BMET Recent Labs    05/24/20 1633 05/25/20 0330  NA 137 138  K 4.1 4.3  CL 101 102  CO2 22 23  GLUCOSE 144* 133*  BUN 35* 31*  CREATININE 3.16* 2.88*  CALCIUM 8.1* 8.5*   PT/INR No results for input(s): LABPROT, INR in the last 72 hours. ABG Recent Labs    05/23/20 0400  PHART 7.409  HCO3 24.4    Studies/Results: No results found.  Anti-infectives: Anti-infectives (From admission, onward)   Start     Dose/Rate Route Frequency Ordered Stop   05/22/20 1200  vancomycin (VANCOREADY) IVPB 1250 mg/250 mL  Status:  Discontinued        1,250 mg 166.7 mL/hr over 90 Minutes Intravenous Every 24 hours 05/21/20 1347 05/24/20 0855   05/22/20 0400  vancomycin (VANCOREADY) IVPB 1250 mg/250 mL  Status:  Discontinued        1,250 mg 166.7 mL/hr over 90 Minutes  Intravenous Every 24 hours 05/21/20 0218 05/21/20 1201   05/21/20 1201  vancomycin variable dose per unstable renal function (pharmacist dosing)  Status:  Discontinued         Does not apply See admin instructions 05/21/20 1201 05/22/20 0708   05/21/20 0315  vancomycin (VANCOCIN) 2,500 mg in sodium chloride 0.9 % 500 mL IVPB        2,500 mg 250 mL/hr over 120 Minutes Intravenous  Once 05/21/20 0218 05/21/20 0540   05/21/20 0315  ceFEPIme (MAXIPIME) 2 g in sodium chloride 0.9 % 100 mL IVPB     Discontinue     2 g 200 mL/hr over 30 Minutes Intravenous Every 12 hours 05/21/20 0218     05/16/20 1700  ceFAZolin (ANCEF) IVPB 1 g/50 mL premix        1 g 100 mL/hr over 30 Minutes Intravenous Every 8 hours 05/16/20 1600 05/17/20 0206   05/16/20 0600  ceFAZolin (ANCEF) 3 g in dextrose 5 % 50 mL IVPB        3 g 100 mL/hr over 30 Minutes Intravenous 30 min pre-op 05/15/20 0725 05/16/20 1944      Assessment/Plan:  1 - RIGHT Renal Neoplasm - s/p nephrectomy. no further cancer directed therapy this admission.    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 Appreciate recommendations of Nephrology,  CCM. Likely transitioning to HD soon.   3 - Uremic encephalopathy - secondary to above, improving  4 - Fever / PNA - on broad spectrum antibiotics, management per ICU team    5- Acute Blood Loss Anemia - No evidence of active post operative bleeding. Agree with PRN transfusion if needed from a cardiovascular/hemodynamic standpoint.      Nicolette Bang 05/25/2020

## 2020-05-26 ENCOUNTER — Inpatient Hospital Stay (HOSPITAL_COMMUNITY): Payer: Medicare Other

## 2020-05-26 DIAGNOSIS — R410 Disorientation, unspecified: Secondary | ICD-10-CM

## 2020-05-26 DIAGNOSIS — D62 Acute posthemorrhagic anemia: Secondary | ICD-10-CM

## 2020-05-26 DIAGNOSIS — J449 Chronic obstructive pulmonary disease, unspecified: Secondary | ICD-10-CM

## 2020-05-26 DIAGNOSIS — I5022 Chronic systolic (congestive) heart failure: Secondary | ICD-10-CM

## 2020-05-26 LAB — CBC
HCT: 22.6 % — ABNORMAL LOW (ref 39.0–52.0)
Hemoglobin: 7.1 g/dL — ABNORMAL LOW (ref 13.0–17.0)
MCH: 30.1 pg (ref 26.0–34.0)
MCHC: 31.4 g/dL (ref 30.0–36.0)
MCV: 95.8 fL (ref 80.0–100.0)
Platelets: 179 10*3/uL (ref 150–400)
RBC: 2.36 MIL/uL — ABNORMAL LOW (ref 4.22–5.81)
RDW: 16.9 % — ABNORMAL HIGH (ref 11.5–15.5)
WBC: 20.8 10*3/uL — ABNORMAL HIGH (ref 4.0–10.5)
nRBC: 0.2 % (ref 0.0–0.2)

## 2020-05-26 LAB — GLUCOSE, CAPILLARY
Glucose-Capillary: 119 mg/dL — ABNORMAL HIGH (ref 70–99)
Glucose-Capillary: 127 mg/dL — ABNORMAL HIGH (ref 70–99)
Glucose-Capillary: 129 mg/dL — ABNORMAL HIGH (ref 70–99)
Glucose-Capillary: 130 mg/dL — ABNORMAL HIGH (ref 70–99)
Glucose-Capillary: 130 mg/dL — ABNORMAL HIGH (ref 70–99)
Glucose-Capillary: 133 mg/dL — ABNORMAL HIGH (ref 70–99)
Glucose-Capillary: 133 mg/dL — ABNORMAL HIGH (ref 70–99)
Glucose-Capillary: 134 mg/dL — ABNORMAL HIGH (ref 70–99)
Glucose-Capillary: 142 mg/dL — ABNORMAL HIGH (ref 70–99)

## 2020-05-26 LAB — CULTURE, BLOOD (ROUTINE X 2)
Culture: NO GROWTH
Culture: NO GROWTH
Special Requests: ADEQUATE
Special Requests: ADEQUATE

## 2020-05-26 LAB — RENAL FUNCTION PANEL
Albumin: 2.2 g/dL — ABNORMAL LOW (ref 3.5–5.0)
Anion gap: 11 (ref 5–15)
BUN: 49 mg/dL — ABNORMAL HIGH (ref 8–23)
CO2: 23 mmol/L (ref 22–32)
Calcium: 8.3 mg/dL — ABNORMAL LOW (ref 8.9–10.3)
Chloride: 102 mmol/L (ref 98–111)
Creatinine, Ser: 5.12 mg/dL — ABNORMAL HIGH (ref 0.61–1.24)
GFR calc Af Amer: 12 mL/min — ABNORMAL LOW (ref >60)
GFR calc non Af Amer: 10 mL/min — ABNORMAL LOW (ref >60)
Glucose, Bld: 144 mg/dL — ABNORMAL HIGH (ref 70–99)
Phosphorus: 3.2 mg/dL (ref 2.5–4.6)
Potassium: 4.2 mmol/L (ref 3.5–5.1)
Sodium: 136 mmol/L (ref 135–145)

## 2020-05-26 LAB — MAGNESIUM: Magnesium: 2.8 mg/dL — ABNORMAL HIGH (ref 1.7–2.4)

## 2020-05-26 MED ORDER — CHLORHEXIDINE GLUCONATE CLOTH 2 % EX PADS
6.0000 | MEDICATED_PAD | Freq: Every day | CUTANEOUS | Status: DC
  Administered 2020-05-27: 6 via TOPICAL

## 2020-05-26 MED ORDER — PROSOURCE TF PO LIQD
90.0000 mL | Freq: Two times a day (BID) | ORAL | Status: DC
  Administered 2020-05-26 – 2020-06-09 (×22): 90 mL
  Filled 2020-05-26 (×31): qty 90

## 2020-05-26 MED ORDER — CHLORHEXIDINE GLUCONATE CLOTH 2 % EX PADS: 6.0000 | MEDICATED_PAD | Freq: Every day | CUTANEOUS | Status: DC

## 2020-05-26 MED ORDER — NEPRO/CARBSTEADY PO LIQD
1000.0000 mL | ORAL | Status: DC
  Administered 2020-05-26 (×2): 1000 mL
  Filled 2020-05-26 (×3): qty 1000

## 2020-05-26 NOTE — Progress Notes (Signed)
Heide Guile, RN called and notified that the pt's HD tx has been moved to 05/27/20.

## 2020-05-26 NOTE — Progress Notes (Signed)
Nutrition Follow-up  DOCUMENTATION CODES:   Obesity unspecified  INTERVENTION:   Tube feeding via Cortrak: - Initiate Nepro @ 15 ml/hr and increase by 10 ml q 4 hours until goal rate of 45 ml/hr (1080 ml/day) - ProSource Tube Feeding 90 ml BID  Tube feeding regimen at goal provides 2104 kcal, 131 grams of protein, and 785 ml of H2O.   NUTRITION DIAGNOSIS:   Increased nutrient needs related to acute illness (Renal failure on CRRT) as evidenced by estimated needs.  Ongoing  GOAL:   Patient will meet greater than or equal to 90% of their needs  Met via TF  MONITOR:   Diet advancement, Labs, Weight trends, TF tolerance, I & O's  REASON FOR ASSESSMENT:   Consult Enteral/tube feeding initiation and management  ASSESSMENT:   74 yo male admitted 7/9 S/P right nephrectomy for large mass. Transferred to the ICU and required intubation on 7/13 due to shock and worsening respiratory failure. PMH includes HTN, cardiomyopathy, DM2, prostate CA, CKD, HLD, gout.  7/14 - CRRT initiated, TF initiated 7/17 - extubated 7/18 - CRRT d/c 7/19 - Cortrak placed, tip gastric per Cortrak team  Discussed pt with RN.  Noted plan for iHD today. RD consulted for tube feeding initiation and management. Pt remains NPO after extubation.  Weight down a total of 24 lbs since admission.  Spoke with pt at beside. Pt requesting water to drink. Pt refused to speak with RD or allow RD to complete NFPE unless water was provided despite education regarding NPO status.  Current TF: Nepro @ 10 ml/hr with goal of 30 ml/hr  Medications reviewed and include: SSI q 4 hours, miralax, senokot, IV abx, ferric gluconate  Labs reviewed: magnesium 1.8, hemoglobin 7.1 CBG's: 119-137 x 24 hours  NUTRITION - FOCUSED PHYSICAL EXAM:  Pt refused.  Diet Order:   Diet Order            Diet NPO time specified  Diet effective now                 EDUCATION NEEDS:   Not appropriate for education at this  time  Skin:  Skin Assessment: Skin Integrity Issues: Incisions: closed abdomen Other: puncture wound to neck (right IJ)  Last BM:  05/19/20 type 7  Height:   Ht Readings from Last 1 Encounters:  05/16/20 _0  (1.88 m)    Weight:   Wt Readings from Last 1 Encounters:  05/26/20 121.4 kg    Ideal Body Weight:  86.4 kg  BMI:  Body mass index is 34.36 kg/m.  Estimated Nutritional Needs:   Kcal:  2200-2400  Protein:  115-130 grams  Fluid:  1 L + UOP    Gaynell Face, MS, RD, LDN Inpatient Clinical Dietitian Please see AMiON for contact information.

## 2020-05-26 NOTE — Progress Notes (Signed)
NAME:  Todd Robertson, MRN:  [id number], DOB:  1946/06/03, LOS: 30 ADMISSION DATE:  05/16/2020, CONSULTATION DATE:  05/16/2020 REFERRING MD:  Dr Alyson Ingles, CHIEF COMPLAINT:  Shock   Brief History   74 yo male former smoker presented on 7/09 for Rt nephrectomy in setting of renal cell carcinoma.  Post op developed hypotension and PCCM consulted.  Past Medical History  OA, Prostate cancer, Cardiomyopathy, CKD, DM 2, Gout, HTN, Asthma  Significant Hospital Events   7/09 Rt nephrectomy 7/13 Rt IJ HD catheter by IR; worsening hypercapnia and AMS >> intubated, start ABx for HCAP 7/14 start CRRT 7/18 off CRRT  Consults:  Nephrology  Procedures:  Rt IJ HD catheter 7/13 >> ETT 7/13 >> 7/17 Rt radial a line 7/15 > 7/18  Significant Diagnostic Tests:   CT abd/pelvis 7/09 >> s/p Rt nephrectomy.  Large volume of Rt retroperitoneal hemorrhage, moderate subcutaneous emphysema Rt abdominal wall  CT abd/pelvis 7/15 >> decreasing postoperative hemorrhage and subcutaneous emphysema, patchy consolidation b/l  Micro Data:  MRSA screen 7/09 >> negative Sputum 7/15 >> normal flora Blood 7/14 >>  Antimicrobials:  Cefepime 7/13 >> Vancomycin 7/13 >> 7/17  Interim history/subjective:  Off CRRT, he denies complaints. Family at bedside today.  Objective   Blood pressure (!) 96/52, pulse 82, temperature 99.3 F (37.4 C), temperature source Axillary, resp. rate (!) 25, height 6\' 2"  (1.88 m), weight 121.4 kg, SpO2 97 %.        Intake/Output Summary (Last 24 hours) at 05/26/2020 0939 Last data filed at 05/26/2020 0400 Gross per 24 hour  Intake 394.77 ml  Output 100 ml  Net 294.77 ml   Filed Weights   05/24/20 0300 05/25/20 0400 05/26/20 0317  Weight: 126.7 kg 120.5 kg 121.4 kg    Examination:  General - ill appearing man sitting up in bed, sleeping intermittently Eyes - eyes anicteric ENT - [place]/AT, dentures in place, oral mucosa moist. Cardiac - RRR, no murmurs Chest - Breathing  comfortably on RA, later on 2L [place]. CTAB. Abdomen - obese, soft, NT Extremities - minimal LE edema, no clubbing or cyanosis. No erythema or warmth of knees- b/l TKA scars. Skin - no rashes . No erythema around dialysis catheter. Neuro - intermittently falling asleep, moving all extremities but globally weak.   Resolved Hospital Problem list   Septic shock, Acute metabolic encephalopathy 2nd to hypercapnia and renal failure  Assessment & Plan:   Acute hypoxic/hypercapnic respiratory failure in setting of HAP and renal failure. Asthma with hx of tobacco abuse. - supplemental O2 to maintain SpO2 >90% - CXR today given leukocytosis - continue pulmicort/brovana with prn albuterol - IS, OOB mobility for pulmonary hygiene  HAP - con't cefepime  Worsening leukocytosis of unknown etiology -repeat CXR -bladder scanned today- no volume -evaluate for possible source of infection -check LFTs in AM, but recently normal GB on CT scan  AKI from [facility] in setting of septic shock. CKD 3. - HD per nephrology  S/p Rt nephrectomy for renal cell carcinoma. - post op care per urology  Anemia from acute blood loss after surgery and chronic disease. - serial CBCs - transfuse for Hb < 7 or hemodynamically significant bleeding -need to discuss with urology when stable to resume DVT prophylaxis  DM type 2  with hyperglycemia. a1c 6.2 - SSI PRN -goal BG 140-180  Chronic combined CHF, HLD - continue ASA - hold outpt lipitor, lotensin, bumex, coreg -needs HD trial prior to transfer out of ICU  Hx  of gout. - outpt allopurinol on hold  Dysphagia, failed 2 bedside swallow studies - place cortrak for meds and tube feeds - appreciate SLP's input  Constipation -resume bowel regimen  Deconditioning -PT/OT  Delirium with prominent negative symptoms -OOB mobility; appreciate PT's assistance -day-night reorientation    Best practice:  Diet: tube feeds DVT prophylaxis: SCD GI prophylaxis:  pepcid Mobility: bedrest Code Status: full Disposition: ICU Family: daughter and wife updated at bedside  Labs    CMP Latest Ref Rng & Units 05/26/2020 05/25/2020 05/24/2020  Glucose 70 - 99 mg/dL 144(H) 133(H) 144(H)  BUN 8 - 23 mg/dL 49(H) 31(H) 35(H)  Creatinine 0.61 - 1.24 mg/dL 5.12(H) 2.88(H) 3.16(H)  Sodium 135 - 145 mmol/L 136 138 137  Potassium 3.5 - 5.1 mmol/L 4.2 4.3 4.1  Chloride 98 - 111 mmol/L 102 102 101  CO2 22 - 32 mmol/L 23 23 22   Calcium 8.9 - 10.3 mg/dL 8.3(L) 8.5(L) 8.1(L)  Total Protein 6.5 - 8.1 g/dL - - -  Total Bilirubin 0.3 - 1.2 mg/dL - - -  Alkaline Phos 38 - 126 U/L - - -  AST 15 - 41 U/L - - -  ALT 0 - 44 U/L - - -    CBC Latest Ref Rng & Units 05/26/2020 05/25/2020 05/24/2020  WBC 4.0 - 10.5 K/uL 20.8(H) 16.7(H) 15.3(H)  Hemoglobin 13.0 - 17.0 g/dL 7.1(L) 7.3(L) 7.6(L)  Hematocrit 39 - 52 % 22.6(L) 23.4(L) 23.9(L)  Platelets 150 - 400 K/uL 179 157 156    ABG    Component Value Date/Time   PHART 7.409 05/23/2020 0400   PCO2ART 38.5 05/23/2020 0400   PO2ART 85 05/23/2020 0400   HCO3 24.4 05/23/2020 0400   TCO2 26 05/23/2020 0400   ACIDBASEDEF 1.0 05/20/2020 2308   O2SAT 97.0 05/23/2020 0400    CBG (last 3)  Recent Labs    05/26/20 0304 05/26/20 0407 05/26/20 0713  GLUCAP 130* 134* 127*       This patient is critically ill with multiple organ system failure which requires frequent high complexity decision making, assessment, support, evaluation, and titration of therapies. This was completed through the application of advanced monitoring technologies and extensive interpretation of multiple databases. During this encounter critical care time was devoted to patient care services described in this note for 40 minutes.  Julian Hy, DO 05/26/20 1:35 PM [facility]

## 2020-05-26 NOTE — Procedures (Signed)
Cortrak  Person Inserting Tube:  Jahsiah Carpenter, RD Tube Type:  Cortrak - 43 inches Tube Location:  Right nare Initial Placement:  Stomach Secured by: Bridle Technique Used to Measure Tube Placement:  Documented cm marking at nare/ corner of mouth Cortrak Secured At:  77 cm    No x-ray is required. RN may begin using tube.   If the tube becomes dislodged please keep the tube and contact the Cortrak team at [email or web address] (password [email or web address]) for replacement.  If after hours and replacement cannot be delayed, place a NG tube and confirm placement with an abdominal x-ray.    Kash Mothershead RD, LDN Clinical Nutrition Pager listed in AMION   

## 2020-05-26 NOTE — Evaluation (Signed)
Physical Therapy Evaluation Patient Details Name: Todd Robertson MRN: [id number] DOB: 21-Aug-1946 Today's Date: 05/26/2020   History of Present Illness  74 year old male with h/o prostate CA, DM2, gout, HTN, presenting 7/9 for planned right Nephrectomy. Went to the OR that day. EBL estimated at 360ml. Intraoperative anesthesia note unremarkable. While in the PACU had declining w/ systolic BPs in 83M. Placed in Trendelenburg; IVF boluses administered. BP normalized but only made 62ml of urine.  Because of this PCCM was asked to assess for circulatory shock and concern about hemodynamics  Clinical Impression  Pt admitted with/for nephrectomy, but had post op complications.  Pt presently needing mod to max assist of 1-2 people for basic mobility.  Pt currently limited functionally due to the problems listed. ( See problems list.)   Pt will benefit from PT to maximize function and safety in order to get ready for next venue listed below.     Follow Up Recommendations CIR    Equipment Recommendations       Recommendations for Other Services Rehab consult     Precautions / Restrictions Precautions Precautions: Fall      Mobility  Bed Mobility Overal bed mobility: Needs Assistance Bed Mobility: Supine to Sit;Sit to Supine     Supine to sit: Max assist Sit to supine: Max assist;+2 for physical assistance   General bed mobility comments: cues for direction, significant assist to come forward and use of pad to help scoot to EOB.  Transfers Overall transfer level: Needs assistance Equipment used: 2 person hand held assist;1 person hand held assist Transfers: Sit to/from Stand Sit to Stand: Mod assist;+2 physical assistance;From elevated surface         General transfer comment: cues for hand placement and assist both to come forward and boost.  Ambulation/Gait             General Gait Details: NT  Stairs            Wheelchair Mobility    Modified Rankin (Stroke  Patients Only)       Balance Overall balance assessment: Needs assistance Sitting-balance support: Bilateral upper extremity supported;No upper extremity supported Sitting balance-Leahy Scale: Fair (to poor.) Sitting balance - Comments: tendency at times to want to list posteriorly.  Want to use UE's for assist   Standing balance support: Bilateral upper extremity supported Standing balance-Leahy Scale: Poor Standing balance comment: moderate posterior listing.  Use of the bed frame for stability.                             Pertinent Vitals/Pain Pain Assessment: Faces Faces Pain Scale: Hurts a little bit Pain Location: R surgical incision with bed mobility Pain Descriptors / Indicators: Discomfort Pain Intervention(s): Monitored during session    Home Living Family/patient expects to be discharged to:: Private residence Living Arrangements: Spouse/significant other Available Help at Discharge: Family;Available 24 hours/day Type of Home: House Home Access: Stairs to enter   [employer] of Steps: 1 Home Layout: One level Home Equipment: Walker - 2 wheels;Bedside commode      Prior Function Level of Independence: Independent         Comments: With ADL's and iADL's     Hand Dominance        Extremity/Trunk Assessment   Upper Extremity Assessment Upper Extremity Assessment: Defer to OT evaluation    Lower Extremity Assessment Lower Extremity Assessment: Generalized weakness  Communication   Communication: No difficulties  Cognition Arousal/Alertness: Awake/alert Behavior During Therapy: WFL for tasks assessed/performed Overall Cognitive Status: Impaired/Different from baseline (NT formally)                                        General Comments General comments (skin integrity, edema, etc.): Sats on RA up to 100%, HR 85-90's, BP sitting 61/46, lying after activity 104/84.    Exercises     Assessment/Plan     PT Assessment Patient needs continued PT services  PT Problem List Decreased strength;Decreased range of motion;Decreased activity tolerance;Decreased balance;Decreased mobility       PT Treatment Interventions DME instruction;Gait training;Functional mobility training;Therapeutic activities;Therapeutic exercise;Balance training;Patient/family education    PT Goals (Current goals can be found in the Care Plan section)  Acute Rehab PT Goals Patient Stated Goal: back home independent, family wants some rehab prior to home. PT Goal Formulation: With patient/family Time For Goal Achievement: 06/09/20 Potential to Achieve Goals: Good    Frequency Min 3X/week   Barriers to discharge        Co-evaluation               AM-PAC PT "6 Clicks" Mobility  Outcome Measure Help needed turning from your back to your side while in a flat bed without using bedrails?: A Little Help needed moving from lying on your back to sitting on the side of a flat bed without using bedrails?: Total Help needed moving to and from a bed to a chair (including a wheelchair)?: Total Help needed standing up from a chair using your arms (e.g., wheelchair or bedside chair)?: A Lot Help needed to walk in hospital room?: A Lot Help needed climbing 3-5 steps with a railing? : Total 6 Click Score: 10    End of Session   Activity Tolerance: Patient tolerated treatment well;Patient limited by fatigue Patient left: in bed;with call bell/phone within reach;with bed alarm set;with family/visitor present Nurse Communication: Mobility status PT Visit Diagnosis: Unsteadiness on feet (R26.81);Difficulty in walking, not elsewhere classified (R26.2);Muscle weakness (generalized) (M62.81)    Time: 7078-6754 PT Time Calculation (min) (ACUTE ONLY): 39 min   Charges:   PT Evaluation $PT Eval Moderate Complexity: 1 Mod PT Treatments $Therapeutic Activity: 8-22 mins        05/26/2020  Ginger Carne., PT Acute  Rehabilitation Services [phone]  (pager) [phone]  (office)  Tessie Fass Tiffany Calmes 05/26/2020, 1:49 PM

## 2020-05-26 NOTE — Progress Notes (Signed)
Rehab Admissions Coordinator Note:  Patient was screened by Cleatrice Burke for appropriateness for an Inpatient Acute Rehab Consult per therapy recommendations.  At this time, we are recommending Inpatient Rehab consult if you would like patient considered for a possible inpt rehab/CIR admit. Please advise.  Cleatrice Burke RN MSN 05/26/2020, 2:56 PM  I can be reached at [phone].

## 2020-05-26 NOTE — Evaluation (Signed)
Occupational Therapy Evaluation Patient Details Name: Todd Robertson MRN: [id number] DOB: 1946-06-16 Today's Date: 05/26/2020    History of Present Illness 74 year old male with h/o prostate CA, DM2, gout, HTN, presenting 7/9 for planned right Nephrectomy. Went to the OR that day. EBL estimated at 372ml. Intraoperative anesthesia note unremarkable. While in the PACU had declining w/ systolic BPs in 42P. Placed in Trendelenburg; IVF boluses administered. BP normalized but only made 50ml of urine.  Because of this PCCM was asked to assess for circulatory shock and concern about hemodynamics   Clinical Impression   PTA, pt was living at home with his wife, pt was independent with ADL/IADL and functional mobility. Currently, due to decreased activity tolerance, generalized weakness, and cognitive limitations, pt requires modA-maxA for sit<>stand, minA for grooming, and minA for UB ADL. Due to decline in current level of function, pt would benefit from acute OT to address established goals to facilitate safe D/C to venue listed below. At this time, recommend CIR follow-up. Will continue to follow acutely to address activity tolerance and endurance, strengthening, education on energy conservation strategies to increase independence with completion of ADL/IADL and functional mobility.      Follow Up Recommendations  CIR    Equipment Recommendations  3 in 1 bedside commode    Recommendations for Other Services       Precautions / Restrictions Precautions Precautions: Fall Restrictions Weight Bearing Restrictions: No      Mobility Bed Mobility Overal bed mobility: Needs Assistance Bed Mobility: Sit to Supine     Supine to sit: Max assist Sit to supine: Max assist;+2 for physical assistance   General bed mobility comments: cues for direction, maxA+2 for trunk control and BLE control to return to supine  Transfers Overall transfer level: Needs assistance Equipment used: 2 person hand  held assist;1 person hand held assist Transfers: Sit to/from Stand Sit to Stand: Mod assist;+2 physical assistance;From elevated surface         General transfer comment: cues for hand placement and assist both to come forward and boost.    Balance Overall balance assessment: Needs assistance Sitting-balance support: Bilateral upper extremity supported;No upper extremity supported Sitting balance-Leahy Scale: Fair (to poor.) Sitting balance - Comments: tendency at times to want to list posteriorly.  Want to use UE's for assist   Standing balance support: Bilateral upper extremity supported Standing balance-Leahy Scale: Poor Standing balance comment: moderate posterior listing.  Use of the bed frame for stability.                           ADL either performed or assessed with clinical judgement   ADL Overall ADL's : Needs assistance/impaired Eating/Feeding: NPO   Grooming: Minimal assistance Grooming Details (indicate cue type and reason): for support at elbow  Upper Body Bathing: Minimal assistance;Sitting;Bed level Upper Body Bathing Details (indicate cue type and reason): minA for support at elbow for shoulder ROM to access armpits for washing and donning deodorant Lower Body Bathing: Total assistance   Upper Body Dressing : Maximal assistance;Sitting   Lower Body Dressing: Maximal assistance Lower Body Dressing Details (indicate cue type and reason): modA+2 to stand    Toilet Transfer Details (indicate cue type and reason): deferred Toileting- Clothing Manipulation and Hygiene: Total assistance Toileting - Clothing Manipulation Details (indicate cue type and reason): pt would require totalA for posterior and pericare     Functional mobility during ADLs: Moderate assistance;+2 for physical assistance;+2  for safety/equipment General ADL Comments: pt sat EOB with minguard assistance for 41min, pt provided with washcloth and grooming items while in supported chair  position in bed, required assistance at elbow for completion of task due to decreased shoulder ROM;modA+2 to stand from EOB     Vision Baseline Vision/History: Wears glasses Wears Glasses: At all times Patient Visual Report: No change from baseline Vision Assessment?: Vision impaired- to be further tested in functional context Additional Comments: pt kept eyes shut majority of the time, attempted to assess but limited due to pt fatigue and decreased sustained visual attention;continue to assess     Perception     Praxis      Pertinent Vitals/Pain Pain Assessment: Faces Faces Pain Scale: Hurts a little bit Pain Location: R surgical incision with bed mobility Pain Descriptors / Indicators: Discomfort Pain Intervention(s): Monitored during session;Limited activity within patient's tolerance     Hand Dominance Right   Extremity/Trunk Assessment Upper Extremity Assessment Upper Extremity Assessment: Generalized weakness;RUE deficits/detail;LUE deficits/detail RUE Deficits / Details: decreased shoulder ROM (flexion/abduction/horizontal adduction) grip WNL RUE Sensation: WNL RUE Coordination: decreased gross motor LUE Deficits / Details: decreased shoulder AROM in all directions LUE Sensation: WNL LUE Coordination: decreased gross motor   Lower Extremity Assessment Lower Extremity Assessment: Generalized weakness   Cervical / Trunk Assessment Cervical / Trunk Assessment: Kyphotic   Communication Communication Communication: No difficulties   Cognition Arousal/Alertness: Awake/alert Behavior During Therapy: WFL for tasks assessed/performed Overall Cognitive Status: Impaired/Different from baseline (NT formally)                                 General Comments: pt oriented to place, year, situation, person;pt required increased time for processing and followed commands with increased time   General Comments  sats on RA up to 100% for 58minutes, with return to  bed following activity SpO2 dropped to 80s, provided supplemental 2lnc spO2 returned to <94%;BP sitting 61/46, lying after activity 104/84    Exercises Exercises: Other exercises Other Exercises Other Exercises: educated family/pt on importance of elevating BUE and movement of BUE for edema management Other Exercises: educated pt/family on importance of continuing to encourage and allow opportunity for pt to complete tasks independently   Shoulder Instructions      Home Living Family/patient expects to be discharged to:: Private residence Living Arrangements: Spouse/significant other Available Help at Discharge: Family;Available 24 hours/day Type of Home: House Home Access: Stairs to enter [employer] of Steps: 1   Home Layout: One level     Bathroom Shower/Tub: Corporate investment banker: Handicapped height Bathroom Accessibility: Yes (to RW, not w/c)   Home Equipment: Walker - 2 wheels;Bedside commode          Prior Functioning/Environment Level of Independence: Independent        Comments: With ADL's and iADL's        OT Problem List: Decreased strength;Decreased range of motion;Decreased activity tolerance;Impaired balance (sitting and/or standing);Decreased cognition;Decreased safety awareness;Decreased knowledge of use of DME or AE;Decreased knowledge of precautions;Cardiopulmonary status limiting activity;Obesity;Impaired UE functional use;Pain      OT Treatment/Interventions: Self-care/ADL training;Therapeutic exercise;DME and/or AE instruction;Energy conservation;Therapeutic activities;Cognitive remediation/compensation;Patient/family education;Visual/perceptual remediation/compensation;Balance training    OT Goals(Current goals can be found in the care plan section) Acute Rehab OT Goals Patient Stated Goal: back home independent, family wants some rehab prior to home. OT Goal Formulation: With patient/family Time For Goal  Achievement: 06/09/20 Potential  to Achieve Goals: Good ADL Goals Pt Will Perform Grooming: with min assist;standing Pt Will Perform Lower Body Dressing: with min assist;sit to/from stand Pt Will Transfer to Toilet: with min assist;ambulating Additional ADL Goal #1: Pt will complete bed mobility with superivsion in preparation for ADL.  OT Frequency: Min 2X/week   Barriers to D/C: Decreased caregiver support          Co-evaluation              AM-PAC OT "6 Clicks" Daily Activity     Outcome Measure Help from another person eating meals?: Total (NPO) Help from another person taking care of personal grooming?: A Little Help from another person toileting, which includes using toliet, bedpan, or urinal?: A Lot Help from another person bathing (including washing, rinsing, drying)?: A Lot Help from another person to put on and taking off regular upper body clothing?: A Little Help from another person to put on and taking off regular lower body clothing?: A Lot 6 Click Score: 13   End of Session Equipment Utilized During Treatment: Oxygen Nurse Communication: Mobility status  Activity Tolerance: Patient tolerated treatment well;Patient limited by fatigue Patient left: in bed;with call bell/phone within reach;with bed alarm set;with family/visitor present;Other (comment) (with bed in chair position)  OT Visit Diagnosis: Unsteadiness on feet (R26.81);Other abnormalities of gait and mobility (R26.89);Muscle weakness (generalized) (M62.81);Feeding difficulties (R63.3);Other symptoms and signs involving cognitive function;Pain Pain - part of body:  (incision site)                Time: 3546-5681 OT Time Calculation (min): 35 min Charges:  OT General Charges $OT Visit: 1 Visit OT Evaluation $OT Eval Moderate Complexity: [place] OTR/L Acute Rehabilitation Services Office: [place] 05/26/2020, 3:17 PM

## 2020-05-26 NOTE — Progress Notes (Addendum)
[facility] Progress Note  Interval events/Subjective:   Off crrt since yesterday. ngt palced. Had nsvt overnight (lytes stable). Open for hd today.  Vitals:   05/26/20 0715 05/26/20 0859 05/26/20 0900 05/26/20 1113  BP:   (!) 120/106   Pulse:   88   Resp:   (!) 23   Temp: 99.3 F (37.4 C)   (!) 97.5 F (36.4 C)  TempSrc: Axillary   Oral  SpO2:  97% 100%   Weight:      Height:        Exam: Gen: nad Heent: +ngt CV: rrr, s1s2, no m/r/g Resp: cta bl, slightly diminished bibasilar, normal wob Abd: lap wounds, distended Ext: trace edema b/l ue's, no edema bl le's Neuro: drowsy but arousable, conversing Rt temp hd cath    Home meds:  - asa 2m/ lipitor 40 hs/ benazepril 20 am/ bumex 126mqd/ coreg 25 bid  - allopurinol 300 hs  - symbicort ACT 2 puff bid  - prn's/ vitamins/ supplements  Date                             Creat               egFR   2018                          1.79- 2.38        30- 43   2019                          1.80- 1.81        42-43   January 12, 2020           3.00                    May 13, 2020                2.60                 27   Jul 9                           2.51                 29- 33   Jul 10                         3.42, 3.89    UA none yet   CT abd wo contrast 7/9 - Adrenals/Urinary Tract: Post right nephrectomy. Large volume of hemorrhage in the right nephrectomy bed. Ill-defined air fluid collection tracks in the right retroperitoneum anterior to the iliopsoas muscle and laterally in the abdominal cavity. Hemorrhage tracks into the right aspect of the pelvis in the is only partially included in the field of view. There is moderate subcutaneous emphysema involving the right abdominal wall. Small foci of air dissects between the right abdominal wall musculature. Simple cyst in the left kidney. There is no left hydronephrosis.        Recent Labs  Lab 05/25/20 0330 05/26/20 0304  K 4.3 4.2  BUN 31* 49*   CREATININE 2.88* 5.12*  CALCIUM 8.5* 8.3*  PHOS 2.3* 3.2  HGB 7.3* 7.1*   Inpatient medications: . arformoterol  15 mcg Nebulization BID  .  aspirin  81 mg Oral Daily  . budesonide (PULMICORT) nebulizer solution  0.5 mg Nebulization BID  . chlorhexidine gluconate (MEDLINE KIT)  15 mL Mouth Rinse BID  . Chlorhexidine Gluconate Cloth  6 each Topical Daily  . darbepoetin (ARANESP) injection - NON-DIALYSIS  60 mcg Subcutaneous Q Sat-1800  . feeding supplement (PROSource TF)  90 mL Per Tube BID  . insulin aspart  0-9 Units Subcutaneous Q4H  . mouth rinse  15 mL Mouth Rinse q12n4p  . polyethylene glycol  17 g Per Tube Daily  . sennosides  5 mL Per Tube BID  . sodium chloride flush  10-40 mL Intracatheter Q12H   . sodium chloride 10 mL/hr at 05/26/20 0400  . ceFEPime (MAXIPIME) IV    . feeding supplement (NEPRO CARB STEADY)    . ferric gluconate (FERRLECIT/NULECIT) IV 125 mg (05/26/20 1046)   sodium chloride, acetaminophen **OR** acetaminophen, albuterol, alum & mag hydroxide-simeth, diphenhydrAMINE **OR** diphenhydrAMINE, menthol-cetylpyridinium, ondansetron, oxyCODONE, promethazine, sodium chloride flush, tiZANidine   Assessment/ Plan: 1. Dialysis dependent AoCKD IV - b/l creat 2.60, eGFR 27 then s/p nephrectomy with resultant progressive renal insufficiency and oliguria.  HD catheter placed IR 7/13 - not functional so replaced bedside 7/15 by PCCM.  Off CRRT 7/18. 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  Will need transition to tunnel cath at some point.   Trial IHD (will try to keep off CRRT for now). 2k bath, IHD time 3.5 hrs, aim for 2-3 L UF. No heparin. Will aim to run him tomorrow given borderline BP 2. Renal mass - sp R nephrectomy 05/16/20; neg margins on path 3. Anemia ckd / abl - hgb 7.1 today, transfusion per primary team. CT 7/9 with hemorrhage in nephrectomy bed - Transfuse per primary. Iron indices show tsat 10%.   Margins were negative, started ESA: aranesp 100 qSat.   Was solding IV iron in  setting of septic shock. Started IV Fe load -- TSAT 10% 7/13 4. Shock, presumed septic resolved 5. Hyponatremia - resolved 6. Hyperkalemia - resolved,  Gean Quint, MD [facility]

## 2020-05-26 NOTE — Progress Notes (Signed)
10 Days Post-Op   Subjective/Chief Complaint:  Patient extubated Saturday. Failed bedside swallow so corpak placed for feeds.  CRRT held today, planning to transition to [facility]. 4.2 L off yesterday with CRRT. Hb stable.  Patient more alert today.  Objective: Vital signs in last 24 hours: Temp:  [97.5 F (36.4 C)-99.5 F (37.5 C)] 97.7 F (36.5 C) (07/19 1523) Pulse Rate:  [79-94] 81 (07/19 1530) Resp:  [13-26] 26 (07/19 1530) BP: (75-120)/(48-106) 96/54 (07/19 1530) SpO2:  [94 %-100 %] 100 % (07/19 1530) Weight:  [121.4 kg] 121.4 kg (07/19 0317) Last BM Date: 05/19/20  Intake/Output from previous day: 07/18 0701 - 07/19 0700 In: 459.8 [I.V.:199.6; IV Piggyback:260.2] Out: 100  Intake/Output this shift: Total I/O In: 141.7 [NG/GT:31.5; IV Piggyback:110.2] Out: -   General appearance: Awake, in no distress Resp: breathing comfortably on [place] Cardio: HR 70s on monitor, regular.  Extremities: edema improveing Abd: Obese, soft, non-distended, tympantic.   Lab Results:  Recent Labs    05/25/20 0330 05/26/20 0304  WBC 16.7* 20.8*  HGB 7.3* 7.1*  HCT 23.4* 22.6*  PLT 157 179   BMET Recent Labs    05/25/20 0330 05/26/20 0304  NA 138 136  K 4.3 4.2  CL 102 102  CO2 23 23  GLUCOSE 133* 144*  BUN 31* 49*  CREATININE 2.88* 5.12*  CALCIUM 8.5* 8.3*   PT/INR No results for input(s): LABPROT, INR in the last 72 hours. ABG No results for input(s): PHART, HCO3 in the last 72 hours.  Invalid input(s): PCO2, PO2  Studies/Results: DG CHEST PORT 1 VIEW  Result Date: 05/26/2020 CLINICAL DATA:  Hypoxia EXAM: PORTABLE CHEST 1 VIEW COMPARISON:  05/23/2020 FINDINGS: Endotracheal tube has been removed in the interval. Pulmonary insufflation has been preserved. Nasoenteric feeding tube has been placed and extends into the upper abdomen beyond the margin of the examination. Left internal jugular Cordis introducer has been placed with its tip extending well past the expected lateral  wall of the superior vena cava, overlying the right pulmonary hilum. Right internal jugular hemodialysis catheter is unchanged. Lungs are clear. No pneumothorax or pleural effusion. Cardiac size within normal limits. Pulmonary vascularity is normal. IMPRESSION: Left internal jugular Cordis introducer tip position is indeterminate, well past the expected lateral wall of the superior vena cava. No pneumothorax or pleural effusion. Interval extubation with preservation of pulmonary volumes. Electronically Signed   By: Fidela Salisbury MD   On: 05/26/2020 15:53    Anti-infectives: Anti-infectives (From admission, onward)   Start     Dose/Rate Route Frequency Ordered Stop   05/26/20 2200  ceFEPIme (MAXIPIME) 1 g in sodium chloride 0.9 % 100 mL IVPB     Discontinue     1 g 200 mL/hr over 30 Minutes Intravenous Every 24 hours 05/25/20 1342     05/22/20 1200  vancomycin (VANCOREADY) IVPB 1250 mg/250 mL  Status:  Discontinued        1,250 mg 166.7 mL/hr over 90 Minutes Intravenous Every 24 hours 05/21/20 1347 05/24/20 0855   05/22/20 0400  vancomycin (VANCOREADY) IVPB 1250 mg/250 mL  Status:  Discontinued        1,250 mg 166.7 mL/hr over 90 Minutes Intravenous Every 24 hours 05/21/20 0218 05/21/20 1201   05/21/20 1201  vancomycin variable dose per unstable renal function (pharmacist dosing)  Status:  Discontinued         Does not apply See admin instructions 05/21/20 1201 05/22/20 0708   05/21/20 0315  vancomycin (VANCOCIN) 2,500  mg in sodium chloride 0.9 % 500 mL IVPB        2,500 mg 250 mL/hr over 120 Minutes Intravenous  Once 05/21/20 0218 05/21/20 0540   05/21/20 0315  ceFEPIme (MAXIPIME) 2 g in sodium chloride 0.9 % 100 mL IVPB  Status:  Discontinued        2 g 200 mL/hr over 30 Minutes Intravenous Every 12 hours 05/21/20 0218 05/25/20 1342   05/16/20 1700  ceFAZolin (ANCEF) IVPB 1 g/50 mL premix        1 g 100 mL/hr over 30 Minutes Intravenous Every 8 hours 05/16/20 1600 05/17/20 0206   05/16/20  0600  ceFAZolin (ANCEF) 3 g in dextrose 5 % 50 mL IVPB        3 g 100 mL/hr over 30 Minutes Intravenous 30 min pre-op 05/15/20 0725 05/16/20 1944      Assessment/Plan:  1 - RIGHT Renal Neoplasm - s/p nephrectomy. no further cancer directed therapy this admission.    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 Appreciate recommendations of Nephrology, CCM.   3 - Uremic encephalopathy / ICU delirium - improving  4 - Fever / PNA - on cefepime, management per ICU team.     5- Acute Blood Loss Anemia - No evidence of active post operative bleeding. Agree with PRN transfusion if needed from a cardiovascular/hemodynamic standpoint. Stable from a urologic standpoint to resume DVT ppx.     Carmie Kanner 05/26/2020

## 2020-05-26 NOTE — Progress Notes (Signed)
eLink Physician-Brief Progress Note Patient Name: [name] DOB: August 12, 1946 MRN: [id number]   Date of Service  05/26/2020  HPI/Events of Note  Notified of episode on non sustained VT. BP remains stable and patient asymptomatic  eICU Interventions  Labs reviewed without significant electrolyte abnormality     Intervention Category Major Interventions: Arrhythmia - evaluation and management  Judd Lien 05/26/2020, 5:40 AM

## 2020-05-26 NOTE — Progress Notes (Signed)
  Speech Language Pathology Treatment: Dysphagia  Patient Details Name: RIYAAN HEROUX MRN: [id number] DOB: August 27, 1946 Today's Date: 05/26/2020 Time: 0762-2633 SLP Time Calculation (min) (ACUTE ONLY): 17 min  Assessment / Plan / Recommendation Clinical Impression  Respiration and increased work of breathing are barriers to PO recommendation. Lethargic but participatory. Oral cavity unremarkable and dentures cleaned and donned. Dysnpeic at baseline increasing after water via cup and delayed cough with correlation to water. Inhaling post swallow not synonymous with safe swallowing. Not ready for instrumental assessment at present. Recommend short term alternate means- can give crushed meds in po until NGT placed. Will follow.    HPI HPI: 74 yo male former smoker presented on 7/09 for Rt nephrectomy in setting of renal cell carcinoma.  Post op developed hypotension. cough with water during yale after extubation; 7/13-7/17.       SLP Plan  Continue with current plan of care       Recommendations  Diet recommendations: NPO;Other(comment) (meds in puree until Cortrak) Medication Administration: Via alternative means                Oral Care Recommendations: Oral care BID Follow up Recommendations:  (TBD) SLP Visit Diagnosis: Dysphagia, unspecified (R13.10) Plan: Continue with current plan of care       GO                [name] 05/26/2020, 9:10 AM [name] [name].Ed Risk analyst [phone] Office [phone]

## 2020-05-27 DIAGNOSIS — R579 Shock, unspecified: Secondary | ICD-10-CM

## 2020-05-27 LAB — CBC WITH DIFFERENTIAL/PLATELET
Abs Immature Granulocytes: 0.73 10*3/uL — ABNORMAL HIGH (ref 0.00–0.07)
Basophils Absolute: 0 10*3/uL (ref 0.0–0.1)
Basophils Relative: 0 %
Eosinophils Absolute: 0.3 10*3/uL (ref 0.0–0.5)
Eosinophils Relative: 2 %
HCT: 20.7 % — ABNORMAL LOW (ref 39.0–52.0)
Hemoglobin: 6.3 g/dL — CL (ref 13.0–17.0)
Immature Granulocytes: 4 %
Lymphocytes Relative: 4 %
Lymphs Abs: 0.7 10*3/uL (ref 0.7–4.0)
MCH: 29.3 pg (ref 26.0–34.0)
MCHC: 30.4 g/dL (ref 30.0–36.0)
MCV: 96.3 fL (ref 80.0–100.0)
Monocytes Absolute: 1.9 10*3/uL — ABNORMAL HIGH (ref 0.1–1.0)
Monocytes Relative: 10 %
Neutro Abs: 15.8 10*3/uL — ABNORMAL HIGH (ref 1.7–7.7)
Neutrophils Relative %: 80 %
Platelets: 168 10*3/uL (ref 150–400)
RBC: 2.15 MIL/uL — ABNORMAL LOW (ref 4.22–5.81)
RDW: 17.3 % — ABNORMAL HIGH (ref 11.5–15.5)
WBC: 19.4 10*3/uL — ABNORMAL HIGH (ref 4.0–10.5)
nRBC: 0 % (ref 0.0–0.2)

## 2020-05-27 LAB — HEPATIC FUNCTION PANEL
ALT: 33 U/L (ref 0–44)
AST: 86 U/L — ABNORMAL HIGH (ref 15–41)
Albumin: 2 g/dL — ABNORMAL LOW (ref 3.5–5.0)
Alkaline Phosphatase: 72 U/L (ref 38–126)
Bilirubin, Direct: 0.8 mg/dL — ABNORMAL HIGH (ref 0.0–0.2)
Indirect Bilirubin: 1.1 mg/dL — ABNORMAL HIGH (ref 0.3–0.9)
Total Bilirubin: 1.9 mg/dL — ABNORMAL HIGH (ref 0.3–1.2)
Total Protein: 5.1 g/dL — ABNORMAL LOW (ref 6.5–8.1)

## 2020-05-27 LAB — RENAL FUNCTION PANEL
Albumin: 2 g/dL — ABNORMAL LOW (ref 3.5–5.0)
Anion gap: 12 (ref 5–15)
BUN: 82 mg/dL — ABNORMAL HIGH (ref 8–23)
CO2: 22 mmol/L (ref 22–32)
Calcium: 8.4 mg/dL — ABNORMAL LOW (ref 8.9–10.3)
Chloride: 105 mmol/L (ref 98–111)
Creatinine, Ser: 8.44 mg/dL — ABNORMAL HIGH (ref 0.61–1.24)
GFR calc Af Amer: 7 mL/min — ABNORMAL LOW (ref >60)
GFR calc non Af Amer: 6 mL/min — ABNORMAL LOW (ref >60)
Glucose, Bld: 205 mg/dL — ABNORMAL HIGH (ref 70–99)
Phosphorus: 4.1 mg/dL (ref 2.5–4.6)
Potassium: 3.9 mmol/L (ref 3.5–5.1)
Sodium: 139 mmol/L (ref 135–145)

## 2020-05-27 LAB — BASIC METABOLIC PANEL
Anion gap: 11 (ref 5–15)
BUN: 71 mg/dL — ABNORMAL HIGH (ref 8–23)
CO2: 24 mmol/L (ref 22–32)
Calcium: 8.6 mg/dL — ABNORMAL LOW (ref 8.9–10.3)
Chloride: 106 mmol/L (ref 98–111)
Creatinine, Ser: 7.63 mg/dL — ABNORMAL HIGH (ref 0.61–1.24)
GFR calc Af Amer: 7 mL/min — ABNORMAL LOW (ref >60)
GFR calc non Af Amer: 6 mL/min — ABNORMAL LOW (ref >60)
Glucose, Bld: 174 mg/dL — ABNORMAL HIGH (ref 70–99)
Potassium: 3.9 mmol/L (ref 3.5–5.1)
Sodium: 141 mmol/L (ref 135–145)

## 2020-05-27 LAB — CBC
HCT: 23.3 % — ABNORMAL LOW (ref 39.0–52.0)
HCT: 24.9 % — ABNORMAL LOW (ref 39.0–52.0)
Hemoglobin: 7.1 g/dL — ABNORMAL LOW (ref 13.0–17.0)
Hemoglobin: 7.7 g/dL — ABNORMAL LOW (ref 13.0–17.0)
MCH: 28.7 pg (ref 26.0–34.0)
MCH: 29.2 pg (ref 26.0–34.0)
MCHC: 30.5 g/dL (ref 30.0–36.0)
MCHC: 30.9 g/dL (ref 30.0–36.0)
MCV: 94.3 fL (ref 80.0–100.0)
MCV: 94.3 fL (ref 80.0–100.0)
Platelets: 173 10*3/uL (ref 150–400)
Platelets: 192 10*3/uL (ref 150–400)
RBC: 2.47 MIL/uL — ABNORMAL LOW (ref 4.22–5.81)
RBC: 2.64 MIL/uL — ABNORMAL LOW (ref 4.22–5.81)
RDW: 17.7 % — ABNORMAL HIGH (ref 11.5–15.5)
RDW: 17.9 % — ABNORMAL HIGH (ref 11.5–15.5)
WBC: 18.9 10*3/uL — ABNORMAL HIGH (ref 4.0–10.5)
WBC: 21.5 10*3/uL — ABNORMAL HIGH (ref 4.0–10.5)
nRBC: 0 % (ref 0.0–0.2)
nRBC: 0 % (ref 0.0–0.2)

## 2020-05-27 LAB — GLUCOSE, CAPILLARY
Glucose-Capillary: 139 mg/dL — ABNORMAL HIGH (ref 70–99)
Glucose-Capillary: 159 mg/dL — ABNORMAL HIGH (ref 70–99)
Glucose-Capillary: 172 mg/dL — ABNORMAL HIGH (ref 70–99)
Glucose-Capillary: 178 mg/dL — ABNORMAL HIGH (ref 70–99)
Glucose-Capillary: 179 mg/dL — ABNORMAL HIGH (ref 70–99)
Glucose-Capillary: 185 mg/dL — ABNORMAL HIGH (ref 70–99)

## 2020-05-27 LAB — PREPARE RBC (CROSSMATCH)

## 2020-05-27 LAB — MAGNESIUM: Magnesium: 3.1 mg/dL — ABNORMAL HIGH (ref 1.7–2.4)

## 2020-05-27 MED ORDER — PHENYLEPHRINE HCL-NACL 10-0.9 MG/250ML-% IV SOLN
25.0000 ug/min | INTRAVENOUS | Status: DC
  Administered 2020-05-27: 25 ug/min via INTRAVENOUS
  Filled 2020-05-27 (×2): qty 250

## 2020-05-27 MED ORDER — SODIUM CHLORIDE 0.9 % IV SOLN: 250.0000 mL | INTRAVENOUS | Status: DC

## 2020-05-27 MED ORDER — HEPARIN SODIUM (PORCINE) 1000 UNIT/ML DIALYSIS
1000.0000 [IU] | INTRAMUSCULAR | Status: DC | PRN
  Administered 2020-06-02 (×2): 3000 [IU] via INTRAVENOUS_CENTRAL
  Filled 2020-05-27: qty 6
  Filled 2020-05-27: qty 3
  Filled 2020-05-27 (×2): qty 6
  Filled 2020-05-27: qty 3

## 2020-05-27 MED ORDER — MELATONIN 3 MG PO TABS
3.0000 mg | ORAL_TABLET | Freq: Every day | ORAL | Status: DC
  Administered 2020-05-27 – 2020-05-29 (×3): 3 mg
  Filled 2020-05-27 (×3): qty 1

## 2020-05-27 MED ORDER — PHENYLEPHRINE CONCENTRATED 100MG/250ML (0.4 MG/ML) INFUSION SIMPLE
25.0000 ug/min | INTRAVENOUS | Status: DC
  Administered 2020-05-27: 40 ug/min via INTRAVENOUS
  Administered 2020-05-28: 90 ug/min via INTRAVENOUS
  Administered 2020-05-30: 150 ug/min via INTRAVENOUS
  Administered 2020-05-30: 125 ug/min via INTRAVENOUS
  Administered 2020-05-31: 100 ug/min via INTRAVENOUS
  Administered 2020-06-01: 60 ug/min via INTRAVENOUS
  Filled 2020-05-27 (×6): qty 250

## 2020-05-27 MED ORDER — PRISMASOL BGK 0/2.5 32-2.5 MEQ/L IV SOLN
INTRAVENOUS | Status: DC
  Filled 2020-05-27 (×11): qty 5000

## 2020-05-27 MED ORDER — HEPARIN SODIUM (PORCINE) 5000 UNIT/ML IJ SOLN
5000.0000 [IU] | Freq: Three times a day (TID) | INTRAMUSCULAR | Status: DC
  Administered 2020-05-27 – 2020-05-31 (×12): 5000 [IU] via SUBCUTANEOUS
  Filled 2020-05-27 (×11): qty 1

## 2020-05-27 MED ORDER — PRISMASOL BGK 4/2.5 32-4-2.5 MEQ/L REPLACEMENT SOLN
Status: DC
  Filled 2020-05-27 (×13): qty 5000

## 2020-05-27 MED ORDER — MIDODRINE HCL 5 MG PO TABS
5.0000 mg | ORAL_TABLET | Freq: Three times a day (TID) | ORAL | Status: DC
  Administered 2020-05-27 – 2020-05-28 (×3): 5 mg via ORAL
  Filled 2020-05-27 (×3): qty 1

## 2020-05-27 MED ORDER — RESOURCE THICKENUP CLEAR PO POWD
ORAL | Status: DC | PRN
  Filled 2020-05-27: qty 125

## 2020-05-27 MED ORDER — SODIUM CHLORIDE 0.9% IV SOLUTION: Freq: Once | INTRAVENOUS | Status: AC

## 2020-05-27 MED ORDER — INSULIN DETEMIR 100 UNIT/ML ~~LOC~~ SOLN
5.0000 [IU] | Freq: Every day | SUBCUTANEOUS | Status: DC
  Administered 2020-05-27 – 2020-05-29 (×3): 5 [IU] via SUBCUTANEOUS
  Filled 2020-05-27 (×4): qty 0.05

## 2020-05-27 MED ORDER — SODIUM CHLORIDE 0.9 % IV SOLN
2.0000 g | Freq: Once | INTRAVENOUS | Status: AC
  Administered 2020-05-27: 2 g via INTRAVENOUS
  Filled 2020-05-27: qty 2

## 2020-05-27 MED ORDER — PRISMASOL BGK 4/2.5 32-4-2.5 MEQ/L IV SOLN
INTRAVENOUS | Status: DC
  Filled 2020-05-27 (×57): qty 5000

## 2020-05-27 NOTE — Progress Notes (Signed)
11 Days Post-Op   Subjective/Chief Complaint:  iHD held yesterday as patient with intermittent hypotension. Transfused 1 u pRBC for Hb 6.7. Much more alert this am, requesting ice chips for comfort. Increasingly edematous from yesterday. Remains on 2L [place]. Afebrile. Denies pain.   Objective: Vital signs in last 24 hours: Temp:  [97.5 F (36.4 C)-98.9 F (37.2 C)] 98.6 F (37 C) (07/20 0800) Pulse Rate:  [73-115] 80 (07/20 0845) Resp:  [13-30] 25 (07/20 0845) BP: (75-106)/(37-63) 99/53 (07/20 0845) SpO2:  [97 %-100 %] 100 % (07/20 0845) Last BM Date: 05/19/20  Intake/Output from previous day: 07/19 0701 - 07/20 0700 In: 814.9 [I.V.:10.3; NG/GT:594.5; IV Piggyback:210.2] Out: -  Intake/Output this shift: Total I/O In: 232 [Blood:232] Out: -   General appearance: Awake, in no distress Resp: breathing comfortably on [place] Cardio: HR 70s on monitor, regular.  Extremities: edema improveing Abd: Obese, soft, non-distended, tympantic. Right flank hematoma improving  Lab Results:  Recent Labs    05/26/20 0304 05/27/20 0309  WBC 20.8* 19.4*  HGB 7.1* 6.3*  HCT 22.6* 20.7*  PLT 179 168   BMET Recent Labs    05/26/20 0304 05/27/20 0309  NA 136 141  K 4.2 3.9  CL 102 106  CO2 23 24  GLUCOSE 144* 174*  BUN 49* 71*  CREATININE 5.12* 7.63*  CALCIUM 8.3* 8.6*   PT/INR No results for input(s): LABPROT, INR in the last 72 hours. ABG No results for input(s): PHART, HCO3 in the last 72 hours.  Invalid input(s): PCO2, PO2  Studies/Results: DG CHEST PORT 1 VIEW  Result Date: 05/26/2020 CLINICAL DATA:  Check feeding tube placement EXAM: PORTABLE CHEST 1 VIEW COMPARISON:  05/26/2020, 05/23/2020 FINDINGS: Incompletely visualized right central venous catheter tip over the SVC. Removal of previously noted left jugular central venous catheter. Esophageal tube tip is below the diaphragm but incompletely included. Left basilar airspace disease. IMPRESSION: 1. Esophageal tube tip below  the diaphragm but incompletely included. 2. Left basilar airspace disease. Removal of left-sided central venous catheter. Electronically Signed   By: Donavan Foil M.D.   On: 05/26/2020 20:05   DG CHEST PORT 1 VIEW  Addendum Date: 05/26/2020   ADDENDUM REPORT: 05/26/2020 16:05 ADDENDUM: These results were called by telephone at the time of interpretation on 05/26/2020 at 4:04 pm to provider Noemi Chapel , who verbally acknowledged these results. Electronically Signed   By: Fidela Salisbury MD   On: 05/26/2020 16:05   Result Date: 05/26/2020 CLINICAL DATA:  Hypoxia EXAM: PORTABLE CHEST 1 VIEW COMPARISON:  05/23/2020 FINDINGS: Endotracheal tube has been removed in the interval. Pulmonary insufflation has been preserved. Nasoenteric feeding tube has been placed and extends into the upper abdomen beyond the margin of the examination. Left internal jugular Cordis introducer has been placed with its tip extending well past the expected lateral wall of the superior vena cava, overlying the right pulmonary hilum. Right internal jugular hemodialysis catheter is unchanged. Lungs are clear. No pneumothorax or pleural effusion. Cardiac size within normal limits. Pulmonary vascularity is normal. IMPRESSION: Left internal jugular Cordis introducer tip position is indeterminate, well past the expected lateral wall of the superior vena cava. No pneumothorax or pleural effusion. Interval extubation with preservation of pulmonary volumes. Electronically Signed: By: Fidela Salisbury MD On: 05/26/2020 15:53    Anti-infectives: Anti-infectives (From admission, onward)   Start     Dose/Rate Route Frequency Ordered Stop   05/26/20 2200  ceFEPIme (MAXIPIME) 1 g in sodium chloride 0.9 % 100 mL  IVPB     Discontinue     1 g 200 mL/hr over 30 Minutes Intravenous Every 24 hours 05/25/20 1342     05/22/20 1200  vancomycin (VANCOREADY) IVPB 1250 mg/250 mL  Status:  Discontinued        1,250 mg 166.7 mL/hr over 90 Minutes Intravenous  Every 24 hours 05/21/20 1347 05/24/20 0855   05/22/20 0400  vancomycin (VANCOREADY) IVPB 1250 mg/250 mL  Status:  Discontinued        1,250 mg 166.7 mL/hr over 90 Minutes Intravenous Every 24 hours 05/21/20 0218 05/21/20 1201   05/21/20 1201  vancomycin variable dose per unstable renal function (pharmacist dosing)  Status:  Discontinued         Does not apply See admin instructions 05/21/20 1201 05/22/20 0708   05/21/20 0315  vancomycin (VANCOCIN) 2,500 mg in sodium chloride 0.9 % 500 mL IVPB        2,500 mg 250 mL/hr over 120 Minutes Intravenous  Once 05/21/20 0218 05/21/20 0540   05/21/20 0315  ceFEPIme (MAXIPIME) 2 g in sodium chloride 0.9 % 100 mL IVPB  Status:  Discontinued        2 g 200 mL/hr over 30 Minutes Intravenous Every 12 hours 05/21/20 0218 05/25/20 1342   05/16/20 1700  ceFAZolin (ANCEF) IVPB 1 g/50 mL premix        1 g 100 mL/hr over 30 Minutes Intravenous Every 8 hours 05/16/20 1600 05/17/20 0206   05/16/20 0600  ceFAZolin (ANCEF) 3 g in dextrose 5 % 50 mL IVPB        3 g 100 mL/hr over 30 Minutes Intravenous 30 min pre-op 05/15/20 0725 05/16/20 1944      Assessment/Plan:  1 - RIGHT Renal Neoplasm - s/p nephrectomy. no further cancer directed therapy this admission.    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 Resuming CRRT again today but eventually planning to transition to iHD. Appreciate recommendations of Nephrology, CCM.   3 - Uremic encephalopathy / ICU delirium - improving daily  4 - Fever / PNA - on cefepime, management per ICU team. No fever for past 7 days. Leukocytosis decreasing slightly.    5- Acute Blood Loss Anemia - No evidence of active post operative bleeding. Agree with PRN transfusion if needed from a cardiovascular/hemodynamic standpoint. Stable from a urologic standpoint to resume DVT ppx.     Carmie Kanner 05/27/2020

## 2020-05-27 NOTE — Progress Notes (Addendum)
NAME:  Todd Robertson, MRN:  [id number], DOB:  [birth year], LOS: 41 ADMISSION DATE:  05/16/2020, CONSULTATION DATE:  05/16/2020 REFERRING MD:  Dr Alyson Ingles, CHIEF COMPLAINT:  Shock   Brief History   74 yo male former smoker presented on 7/09 for Rt nephrectomy in setting of renal cell carcinoma.  Post op developed hypotension and PCCM consulted.  Past Medical History  OA, Prostate cancer, Cardiomyopathy, CKD, DM 2, Gout, HTN, Asthma  Significant Hospital Events   7/09 Rt nephrectomy 7/13 Rt IJ HD catheter by IR; worsening hypercapnia and AMS >> intubated, start ABx for HCAP 7/14 start CRRT 7/18 off CRRT  Consults:  Nephrology  Procedures:  Rt IJ HD catheter 7/13 >> ETT 7/13 >> 7/17 Rt radial a line 7/15 > 7/18  Significant Diagnostic Tests:   CT abd/pelvis 7/09 >> s/p Rt nephrectomy.  Large volume of Rt retroperitoneal hemorrhage, moderate subcutaneous emphysema Rt abdominal wall  CT abd/pelvis 7/15 >> decreasing postoperative hemorrhage and subcutaneous emphysema, patchy consolidation b/l  Micro Data:  MRSA screen 7/09 >> negative Sputum 7/15 >> normal flora Blood 7/14 >>  Antimicrobials:  Cefepime 7/13 >> Vancomycin 7/13 >> 7/17  Interim history/subjective:  Drop in hgb overnight>transfused 1U PRBC.  Hypotensive overnight but now improving again  Objective   Blood pressure (!) 93/45, pulse 81, temperature 98.6 F (37 C), temperature source Axillary, resp. rate (!) 23, height 6\' 2"  (1.88 m), weight 121.4 kg, SpO2 98 %.        Intake/Output Summary (Last 24 hours) at 05/27/2020 0814 Last data filed at 05/27/2020 0700 Gross per 24 hour  Intake 814.94 ml  Output --  Net 814.94 ml   Filed Weights   05/24/20 0300 05/25/20 0400 05/26/20 0317  Weight: 126.7 kg 120.5 kg 121.4 kg    Examination:  General - chronically ill appearing HEENT: normal. cortrak present Cardiac - RRR. Minimal LE edema Chest - lungs clear anteriorly. Abdomen - soft. Hypoactive  bs Extremities - warm Skin - no rashes .  Neuro - awakens to voice but quickly falls back asleep   Resolved Hospital Problem list   Septic shock, Acute metabolic encephalopathy 2nd to hypercapnia and renal failure  Assessment & Plan:   Acute hypoxic/hypercapnic respiratory failure in setting of HAP and renal failure. Remains on 2L [place] since yesterday. Respiratory status barring safe po intake per SLP eval.  HAP. White count remains elevated at 19 but remains afebrile. CXR from yesterday improved c/w 7/16.  Asthma with hx of tobacco abuse. - cefepime-tx day #7. Would consider stopping after today given being afebrile and improvement in CXR. - supplemental O2 to maintain SpO2 >90% - continue pulmicort/brovana with prn albuterol - IS, OOB mobility for pulmonary hygiene  Persistent hypotension--MAPs stable. - continue neo to maintain MAP >65 - start midodrine 5mg  tid  Persistent leukocytosis since 7/10. No other clear s/s of infection other than improving HAP. Negative blood and respiratory cultures from 7/14. Can not rule out urinary source however, in the setting of lack of other s/s of infection, would continue to monitor for now. Could also consider leukocytosis in the setting of multiple blood transfusions.  AKI on CKD 3 from ATN in setting of septic shock. Hypotension barring iHD. Discussed with nephrology this morning who would like to resume CRRT. - resuming CRRT per nephrology  S/p Rt nephrectomy for renal cell carcinoma. - post op care per urology. Ok to resume VTE prophylaxis from their standpoint.  Anemia from acute blood loss after surgery  and chronic disease. hgb 6.2 this morning. Transfused 1U PRBC. No significant hemodynamic changes to suggest acute process. - serial CBCs - transfuse for Hb < 7 or hemodynamically significant bleeding - could consider Fe transfusion once more reassured against infection  DM type 2  Glucoses stable. Continue SSI  Chronic combined CHF,  HLD. Appearing fairly euvolemic this morning. Continue aspirin.   Hx of gout. - outpt allopurinol on hold  Constipation. cont bowel regimen  Deconditioning. PT/OT  Delirium with prominent negative symptoms -OOB mobility; appreciate PT's assistance -day-night reorientation    Best practice:  Diet: tube feeds DVT prophylaxis: subcut heparin GI prophylaxis: n/a Mobility: bedrest Code Status: full Disposition: ICU Family: daughter updated  Labs    CMP Latest Ref Rng & Units 05/27/2020 05/26/2020 05/25/2020  Glucose 70 - 99 mg/dL 174(H) 144(H) 133(H)  BUN 8 - 23 mg/dL 71(H) 49(H) 31(H)  Creatinine 0.61 - 1.24 mg/dL 7.63(H) 5.12(H) 2.88(H)  Sodium 135 - 145 mmol/L 141 136 138  Potassium 3.5 - 5.1 mmol/L 3.9 4.2 4.3  Chloride 98 - 111 mmol/L 106 102 102  CO2 22 - 32 mmol/L 24 23 23   Calcium 8.9 - 10.3 mg/dL 8.6(L) 8.3(L) 8.5(L)  Total Protein 6.5 - 8.1 g/dL 5.1(L) - -  Total Bilirubin 0.3 - 1.2 mg/dL 1.9(H) - -  Alkaline Phos 38 - 126 U/L 72 - -  AST 15 - 41 U/L 86(H) - -  ALT 0 - 44 U/L 33 - -    CBC Latest Ref Rng & Units 05/27/2020 05/26/2020 05/25/2020  WBC 4.0 - 10.5 K/uL 19.4(H) 20.8(H) 16.7(H)  Hemoglobin 13.0 - 17.0 g/dL 6.3(LL) 7.1(L) 7.3(L)  Hematocrit 39 - 52 % 20.7(L) 22.6(L) 23.4(L)  Platelets 150 - 400 K/uL 168 179 157    ABG    Component Value Date/Time   PHART 7.409 05/23/2020 0400   PCO2ART 38.5 05/23/2020 0400   PO2ART 85 05/23/2020 0400   HCO3 24.4 05/23/2020 0400   TCO2 26 05/23/2020 0400   ACIDBASEDEF 1.0 05/20/2020 2308   O2SAT 97.0 05/23/2020 0400    CBG (last 3)  Recent Labs    05/26/20 2311 05/27/20 0315 05/27/20 0708  GLUCAP 130* 159* 172*       This patient is critically ill with multiple organ system failure which requires frequent high complexity decision making, assessment, support, evaluation, and titration of therapies. This was completed through the application of advanced monitoring technologies and extensive interpretation  of multiple databases. During this encounter critical care time was devoted to patient care services described in this note for 40 minutes.  Tavita Eastham [place], DO 05/27/20 8:14 AM [facility]

## 2020-05-27 NOTE — Progress Notes (Signed)
eLink Physician-Brief Progress Note Patient Name: BRANCH PACITTI DOB: [birth year] MRN: [id number]   Date of Service  05/27/2020  HPI/Events of Note  Nursing request for AM labs.  eICU Interventions  Plan: 1. CBC with platelets, BMP and Mg++ level in AM.     Intervention Category Major Interventions: Other:  Jaidee Stipe [name] 05/27/2020, 2:58 AM

## 2020-05-27 NOTE — Progress Notes (Signed)
CRITICAL VALUE ALERT  Critical Value:  Hemoglobin 6.3   Date & Time Notied:  05/27/20, 0330  Provider Notified: E-link  Orders Received/Actions taken: 1 U PRBC ordered

## 2020-05-27 NOTE — Progress Notes (Addendum)
[facility] Progress Note  Interval events/Subjective:   Held on HD yesterday due to staffing issues along with hypotension. Now receiving 1u prbc and on phenyl for pressor support. Drowsy but arousable. No complaints.  Vitals:   05/27/20 0700 05/27/20 0800 05/27/20 0837 05/27/20 0845  BP: (!) 93/45 (!) 90/46 (!) 90/46 (!) 99/53  Pulse: 81 79  80  Resp: (!) 23 (!) 26 (!) 26 (!) 25  Temp: 98.4 F (36.9 C) 98.6 F (37 C)    TempSrc: Axillary Axillary    SpO2: 98% 100%  100%  Weight:      Height:        Exam: Gen: nad, lethargic/drowsy Heent: +ngt CV: rrr, s1s2, no m/r/g Resp: cta bl, slightly diminished bibasilar, no overt w/r/r/c, normal wob Abd: lap wounds, distended Ext: trace edema b/l ue's, no edema bl le's Neuro: drowsy but arousable, conversing Rt temp hd cath (trialysis)    Home meds:  - asa 88m/ lipitor 40 hs/ benazepril 20 am/ bumex 125mqd/ coreg 25 bid  - allopurinol 300 hs  - symbicort ACT 2 puff bid  - prn's/ vitamins/ supplements  Date                             Creat               egFR   2018                          1.79- 2.38        30- 43   2019                          1.80- 1.81        42-43   January 12, 2020           3.00                    May 13, 2020                2.60                 27   Jul 9                           2.51                 29- 33   Jul 10                         3.42, 3.89    UA none yet   CT abd wo contrast 7/9 - Adrenals/Urinary Tract: Post right nephrectomy. Large volume of hemorrhage in the right nephrectomy bed. Ill-defined air fluid collection tracks in the right retroperitoneum anterior to the iliopsoas muscle and laterally in the abdominal cavity. Hemorrhage tracks into the right aspect of the pelvis in the is only partially included in the field of view. There is moderate subcutaneous emphysema involving the right abdominal wall. Small foci of air dissects between the right abdominal wall  musculature. Simple cyst in the left kidney. There is no left hydronephrosis.        Recent Labs  Lab 05/25/20 0330 05/25/20 0330 05/26/20 0304 05/27/20 0309  K 4.3   < > 4.2 3.9  BUN  31*   < > 49* 71*  CREATININE 2.88*   < > 5.12* 7.63*  CALCIUM 8.5*   < > 8.3* 8.6*  PHOS 2.3*  --  3.2  --   HGB 7.3*   < > 7.1* 6.3*   < > = values in this interval not displayed.   Inpatient medications: . arformoterol  15 mcg Nebulization BID  . aspirin  81 mg Oral Daily  . budesonide (PULMICORT) nebulizer solution  0.5 mg Nebulization BID  . chlorhexidine gluconate (MEDLINE KIT)  15 mL Mouth Rinse BID  . Chlorhexidine Gluconate Cloth  6 each Topical Daily  . Chlorhexidine Gluconate Cloth  6 each Topical Q0600  . Chlorhexidine Gluconate Cloth  6 each Topical Q0600  . darbepoetin (ARANESP) injection - NON-DIALYSIS  60 mcg Subcutaneous Q Sat-1800  . feeding supplement (PROSource TF)  90 mL Per Tube BID  . heparin injection (subcutaneous)  5,000 Units Subcutaneous Q8H  . insulin aspart  0-9 Units Subcutaneous Q4H  . mouth rinse  15 mL Mouth Rinse q12n4p  . midodrine  5 mg Oral TID WC  . polyethylene glycol  17 g Per Tube Daily  . sennosides  5 mL Per Tube BID  . sodium chloride flush  10-40 mL Intracatheter Q12H   . sodium chloride 250 mL (05/27/20 0825)  . sodium chloride    . ceFEPime (MAXIPIME) IV Stopped (05/26/20 2218)  . feeding supplement (NEPRO CARB STEADY) 30 mL/hr at 05/27/20 0700  . ferric gluconate (FERRLECIT/NULECIT) IV 125 mg (05/26/20 1046)  . phenylephrine (NEO-SYNEPHRINE) Adult infusion 25 mcg/min (05/27/20 0828)   sodium chloride, acetaminophen **OR** acetaminophen, albuterol, alum & mag hydroxide-simeth, diphenhydrAMINE **OR** diphenhydrAMINE, menthol-cetylpyridinium, ondansetron, oxyCODONE, promethazine, sodium chloride flush, tiZANidine   Assessment/ Plan: 1. Dialysis dependent AoCKD IV - b/l creat 2.60, eGFR 27 then s/p nephrectomy with resultant progressive  renal insufficiency and oliguria.  HD catheter placed IR 7/13 - not functional so replaced bedside 7/15 by PCCM.  Off CRRT 7/18. 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  Will need transition to tunnel cath at some point.   At this junction, now that he is pressors, would favor transitioning patient back to CRRT in the interim especially as we held off on IHD and he is receiving volume. 2. Renal mass - sp R nephrectomy 05/16/20; neg margins on path 3. Anemia ckd / abl - hgb 6.3 today, transfusion per primary team. CT 7/9 with hemorrhage in nephrectomy bed - Transfuse per primary. Iron indices show tsat 10%.   Margins were negative, started ESA: aranesp 100 qSat.   Was solding IV iron in setting of septic shock. Started IV Fe load -- TSAT 10% 7/13 4. Shock, presumed septic resolved 5. Hyponatremia - resolved 6. Hyperkalemia - resolved,  Gean Quint, MD [facility]

## 2020-05-27 NOTE — Progress Notes (Signed)
  Speech Language Pathology Treatment: Dysphagia  Patient Details Name: Todd Robertson MRN: [id number] DOB: 05-Sep-1946 Today's Date: 05/27/2020 Time: 4360-6770 SLP Time Calculation (min) (ACUTE ONLY): 15 min  Assessment / Plan / Recommendation Clinical Impression  Pt continues to show signs of an acute reversible dysphagia s/p 5 day intubation. He is three days out from extubation and would have expected recovery at this point- possible that he could have underlying issues to affect swallow integrity. Planning to perform FEES this afternoon at 1400. He may have ice chips and sips water until then.    HPI HPI: 74 yo male former smoker presented on 7/09 for Rt nephrectomy in setting of renal cell carcinoma.  Post op developed hypotension. cough with water during yale after extubation; 7/13-7/17.       SLP Plan  Other (Comment);Continue with current plan of care (FEES)       Recommendations  Diet recommendations: Other(comment) (ich chips, small sips water only) Liquids provided via: Cup;Straw Medication Administration: Via alternative means                Oral Care Recommendations: Oral care BID Follow up Recommendations: Skilled Nursing facility (?) SLP Visit Diagnosis: Dysphagia, unspecified (R13.10) Plan: Other (Comment);Continue with current plan of care (FEES)       GO                Todd Robertson 05/27/2020, 10:24 AM  Todd Robertson.Ed Risk analyst [phone] Office [phone]

## 2020-05-27 NOTE — Progress Notes (Signed)
Pharmacy Antibiotic Note  Todd Robertson is a 74 y.o. male admitted on 05/16/2020 with renal cell carcinoma s/p radical nephrectomy.  Pharmacy has been consulted for cefepime dosing for sepsis/PNA. Patient has been receiving cefepime 1g IV q24h with plans for iHD. Patient is transitioning to CRRT today due to need for pressors and unlikely he would tolerate HD. Today is day 7 of cefepime and spoke with Dr. Darrick Meigs and will complete course of cefepime today for 7 days total. WBC 19.4. Afebrile.    Plan: Administer one time dose of cefepime 2g due to CRRT resuming and completing antibiotic course today  Monitor off antibiotics   Height: 6\' 2"  (188 cm) Weight: 121.4 kg (267 lb 10.2 oz) IBW/kg (Calculated) : 82.2  Temp (24hrs), Avg:98.5 F (36.9 C), Min:97.7 F (36.5 C), Max:98.9 F (37.2 C)  Recent Labs  Lab 05/23/20 1607 05/23/20 1607 05/24/20 0315 05/24/20 1633 05/25/20 0330 05/26/20 0304 05/27/20 0309  WBC 14.1*  --  15.3*  --  16.7* 20.8* 19.4*  CREATININE 3.55*   < > 3.36* 3.16* 2.88* 5.12* 7.63*   < > = values in this interval not displayed.    Estimated Creatinine Clearance: 12.1 mL/min (A) (by C-G formula based on SCr of 7.63 mg/dL (H)).    No Known Allergies  Antimicrobials this admission: Vancomycin 7/14 >> 7/17 Cefepime 7/14 >>  Dose adjustments this admission: 7/18: Cefepime changed to 1g q24h for transition from CRRT to [facility] 7/20: Cefepime changed to 2g x1 due transitioning back to CRRT and completing antibiotic course today  Cultures this admission: 7/9 mrsa pcr - neg 7/14 TA - rare budding yeast 7/14 BCx - NGTD  Cristela Felt, PharmD Clinical Pharmacist  05/27/2020, 11:31 AM

## 2020-05-27 NOTE — Procedures (Signed)
Objective Swallowing Evaluation: Type of Study: FEES-Fiberoptic Endoscopic Evaluation of Swallow   Patient Details  Name: Todd Robertson MRN: [id number] Date of Birth: [birth year]  Today's Date: 05/27/2020 Time: SLP Start Time (ACUTE ONLY): 1438 -SLP Stop Time (ACUTE ONLY): 1501  SLP Time Calculation (min) (ACUTE ONLY): 23 min   Past Medical History:  Past Medical History:  Diagnosis Date  . Arthritis   . Cancer [facility])    prostate  . Cardiomyopathy    secondary  . CKD (chronic kidney disease)   . DM2 (diabetes mellitus, type 2) ([place])   . Gout   . HTN (hypertension)    unspec  . Hypercholesterolemia   . Nonischemic cardiomyopathy ([place])    a. EF 40-50% by most recent 2-D echo b. EF 25% by cardiac cath in 2004  . Overweight(278.02)    Past Surgical History:  Past Surgical History:  Procedure Laterality Date  . COLONOSCOPY    . HERNIA REPAIR    . IR FLUORO GUIDE CV LINE RIGHT  05/20/2020  . IR US GUIDE VASC ACCESS RIGHT  05/20/2020  . KNEE ARTHROPLASTY Left 08/08/2017   Procedure: LEFT TOTAL KNEE ARTHROPLASTY WITH COMPUTER NAVIGATION;  Surgeon: Rod Can, MD;  Location: [place];  Service: Orthopedics;  Laterality: Left;  Needs RNFA  . KNEE ARTHROPLASTY Right 11/17/2017   Procedure: RIGHT TOTAL KNEE ARTHROPLASTY WITH COMPUTER NAVIGATION;  Surgeon: Rod Can, MD;  Location: [facility];  Service: Orthopedics;  Laterality: Right;  NEEDS RNFA  . ROBOT ASSISTED LAPAROSCOPIC NEPHRECTOMY Right 05/16/2020   Procedure: XI ROBOTIC ASSISTED LAPAROSCOPIC NEPHRECTOMY;  Surgeon: Cleon Gustin, MD;  Location: [facility];  Service: Urology;  Laterality: Right;  2.5 hrs   HPI: 74 yo male former smoker presented on 7/09 for Rt nephrectomy in setting of renal cell carcinoma.  Post op developed hypotension. cough with water during yale after extubation; 7/13-7/17.    No data recorded   Assessment / Plan / Recommendation  CHL IP CLINICAL IMPRESSIONS 05/27/2020  Clinical Impression  Examination of laryngeal/pharyngeal anatomy was unremarkable without edema or erythema. He exhibited min-mild oral dysphagia marked by reduced cohesion and control resulting in anterior spillage. Onset of swallow was timely with appropriate white out period as epiglottis inverted during the swallow. Residue post swallow was negligible and not concerning during assessment. There was no observable aspiration pre or post swallow and volitional cough produced clear/white phlegm without evidence of aspirated green tint po's. Toward end of evaluation he began to produce increased mucous coughed a few times. View via scope was obstructed briefly due to mucous which may have led to coughing. Frequently during bedside swallow assessment and FEES he inhaled deeply after swallow although could not coorelate with po intrusion of trachea. He is significantly deconditioned, decreased endurance with increased work of breathing. Recommend more conservative onset to po's of nectar thick liquids and Dys 2 (fine chopped) and upgrade at bedside as able. Pt may use straws, small sips, sit upright, pills whole in puree.      SLP Visit Diagnosis Dysphagia, oral phase (R13.11)  Attention and concentration deficit following --  Frontal lobe and executive function deficit following --  Impact on safety and function --      CHL IP TREATMENT RECOMMENDATION 05/27/2020  Treatment Recommendations Therapy as outlined in treatment plan below     Prognosis 05/27/2020  Prognosis for Safe Diet Advancement Good  Barriers to Reach Goals Other (Comment)  Barriers/Prognosis Comment --    CHL IP DIET RECOMMENDATION 05/27/2020  SLP Diet Recommendations Dysphagia 2 (Fine chop) solids;Nectar thick liquid  Liquid Administration via Cup;Straw  Medication Administration Whole meds with puree  Compensations Slow rate;Small sips/bites;Other (Comment)  Postural Changes Seated upright at 90 degrees      CHL IP OTHER RECOMMENDATIONS 05/27/2020   Recommended Consults --  Oral Care Recommendations Oral care BID  Other Recommendations Order thickener from pharmacy      CHL IP FOLLOW UP RECOMMENDATIONS 05/27/2020  Follow up Recommendations Skilled Nursing facility      [facility] IP FREQUENCY AND DURATION 05/27/2020  Speech Therapy Frequency (ACUTE ONLY) min 2x/week  Treatment Duration 2 weeks           CHL IP ORAL PHASE 05/27/2020  Oral Phase Impaired  Oral - Pudding Teaspoon --  Oral - Pudding Cup --  Oral - Honey Teaspoon --  Oral - Honey Cup --  Oral - Nectar Teaspoon --  Oral - Nectar Cup WFL  Oral - Nectar Straw --  Oral - Thin Teaspoon --  Oral - Thin Cup Decreased bolus cohesion  Oral - Thin Straw WFL  Oral - Puree --  Oral - Mech Soft --  Oral - Regular Weak lingual manipulation  Oral - Multi-Consistency --  Oral - Pill --  Oral Phase - Comment --    CHL IP PHARYNGEAL PHASE 05/27/2020  Pharyngeal Phase WFL  Pharyngeal- Pudding Teaspoon --  Pharyngeal --  Pharyngeal- Pudding Cup --  Pharyngeal --  Pharyngeal- Honey Teaspoon --  Pharyngeal --  Pharyngeal- Honey Cup --  Pharyngeal --  Pharyngeal- Nectar Teaspoon --  Pharyngeal --  Pharyngeal- Nectar Cup WFL  Pharyngeal --  Pharyngeal- Nectar Straw --  Pharyngeal --  Pharyngeal- Thin Teaspoon --  Pharyngeal --  Pharyngeal- Thin Cup Inter-arytenoid space residue  Pharyngeal --  Pharyngeal- Thin Straw Inter-arytenoid space residue  Pharyngeal --  Pharyngeal- Puree --  Pharyngeal --  Pharyngeal- Mechanical Soft --  Pharyngeal --  Pharyngeal- Regular WFL  Pharyngeal --  Pharyngeal- Multi-consistency --  Pharyngeal --  Pharyngeal- Pill --  Pharyngeal --  Pharyngeal Comment --     No flowsheet data found.   [name] 05/27/2020, 4:01 PM [name] [name].Ed Risk analyst [phone] Office [phone]

## 2020-05-27 NOTE — Progress Notes (Signed)
eLink Physician-Brief Progress Note Patient Name: Todd Robertson DOB: June 29, 1946 MRN: [id number]   Date of Service  05/27/2020  HPI/Events of Note  Anemia - Hgb = 6.3.   eICU Interventions  Will transfuse 1 unit PRBC.     Intervention Category Major Interventions: Other:  [name] 05/27/2020, 3:49 AM

## 2020-05-27 NOTE — Progress Notes (Signed)
eLink Physician-Brief Progress Note Patient Name: Todd Robertson DOB: 09/24/1946 MRN: [id number]   Date of Service  05/27/2020  HPI/Events of Note  Hypotension - BP = 93/37 with MAP = 54.   eICU Interventions  Plan: 1. Phenylephrine IV infusion. Titrate to MAP >=65.      Intervention Category Major Interventions: Hypotension - evaluation and management  Lyon Dumont Eugene 05/27/2020, 6:42 AM

## 2020-05-28 ENCOUNTER — Inpatient Hospital Stay (HOSPITAL_COMMUNITY): Payer: Medicare Other

## 2020-05-28 DIAGNOSIS — Z905 Acquired absence of kidney: Secondary | ICD-10-CM | POA: Diagnosis not present

## 2020-05-28 DIAGNOSIS — D62 Acute posthemorrhagic anemia: Secondary | ICD-10-CM | POA: Diagnosis not present

## 2020-05-28 DIAGNOSIS — R578 Other shock: Secondary | ICD-10-CM | POA: Diagnosis not present

## 2020-05-28 DIAGNOSIS — R410 Disorientation, unspecified: Secondary | ICD-10-CM | POA: Diagnosis not present

## 2020-05-28 DIAGNOSIS — R579 Shock, unspecified: Secondary | ICD-10-CM | POA: Diagnosis not present

## 2020-05-28 LAB — RENAL FUNCTION PANEL
Albumin: 2.3 g/dL — ABNORMAL LOW (ref 3.5–5.0)
Albumin: 2.2 g/dL — ABNORMAL LOW (ref 3.5–5.0)
Anion gap: 13 (ref 5–15)
Anion gap: 11 (ref 5–15)
BUN: 64 mg/dL — ABNORMAL HIGH (ref 8–23)
BUN: 53 mg/dL — ABNORMAL HIGH (ref 8–23)
CO2: 21 mmol/L — ABNORMAL LOW (ref 22–32)
CO2: 23 mmol/L (ref 22–32)
Calcium: 8.5 mg/dL — ABNORMAL LOW (ref 8.9–10.3)
Calcium: 8.3 mg/dL — ABNORMAL LOW (ref 8.9–10.3)
Chloride: 106 mmol/L (ref 98–111)
Chloride: 101 mmol/L (ref 98–111)
Creatinine, Ser: 6.45 mg/dL — ABNORMAL HIGH (ref 0.61–1.24)
Creatinine, Ser: 5.14 mg/dL — ABNORMAL HIGH (ref 0.61–1.24)
GFR calc Af Amer: 9 mL/min — ABNORMAL LOW (ref >60)
GFR calc Af Amer: 12 mL/min — ABNORMAL LOW (ref >60)
GFR calc non Af Amer: 8 mL/min — ABNORMAL LOW (ref >60)
GFR calc non Af Amer: 10 mL/min — ABNORMAL LOW (ref >60)
Glucose, Bld: 170 mg/dL — ABNORMAL HIGH (ref 70–99)
Glucose, Bld: 143 mg/dL — ABNORMAL HIGH (ref 70–99)
Phosphorus: 2.8 mg/dL (ref 2.5–4.6)
Phosphorus: 2.5 mg/dL (ref 2.5–4.6)
Potassium: 4.1 mmol/L (ref 3.5–5.1)
Potassium: 4 mmol/L (ref 3.5–5.1)
Sodium: 140 mmol/L (ref 135–145)
Sodium: 135 mmol/L (ref 135–145)

## 2020-05-28 LAB — CBC
HCT: 27.2 % — ABNORMAL LOW (ref 39.0–52.0)
Hemoglobin: 8.4 g/dL — ABNORMAL LOW (ref 13.0–17.0)
MCH: 29.3 pg (ref 26.0–34.0)
MCHC: 30.9 g/dL (ref 30.0–36.0)
MCV: 94.8 fL (ref 80.0–100.0)
Platelets: 186 10*3/uL (ref 150–400)
RBC: 2.87 MIL/uL — ABNORMAL LOW (ref 4.22–5.81)
RDW: 18.1 % — ABNORMAL HIGH (ref 11.5–15.5)
WBC: 22.2 10*3/uL — ABNORMAL HIGH (ref 4.0–10.5)
nRBC: 0.1 % (ref 0.0–0.2)

## 2020-05-28 LAB — MAGNESIUM: Magnesium: 2.9 mg/dL — ABNORMAL HIGH (ref 1.7–2.4)

## 2020-05-28 LAB — GLUCOSE, CAPILLARY
Glucose-Capillary: 168 mg/dL — ABNORMAL HIGH (ref 70–99)
Glucose-Capillary: 135 mg/dL — ABNORMAL HIGH (ref 70–99)
Glucose-Capillary: 140 mg/dL — ABNORMAL HIGH (ref 70–99)
Glucose-Capillary: 123 mg/dL — ABNORMAL HIGH (ref 70–99)
Glucose-Capillary: 139 mg/dL — ABNORMAL HIGH (ref 70–99)
Glucose-Capillary: 142 mg/dL — ABNORMAL HIGH (ref 70–99)

## 2020-05-28 LAB — TYPE AND SCREEN
ABO/RH(D): O POS
Antibody Screen: NEGATIVE
Unit division: 0

## 2020-05-28 LAB — BPAM RBC
Blood Product Expiration Date: 202108072359
ISSUE DATE / TIME: 202107200548
Unit Type and Rh: 5100

## 2020-05-28 LAB — ECHOCARDIOGRAM COMPLETE
Area-P 1/2: 3.6 cm2
Height: 74 in
S' Lateral: 2.5 cm
Weight: 4236.36 oz

## 2020-05-28 MED ORDER — IOHEXOL 300 MG/ML  SOLN
100.0000 mL | Freq: Once | INTRAMUSCULAR | Status: AC | PRN
  Administered 2020-05-28: 100 mL via INTRAVENOUS

## 2020-05-28 MED ORDER — IOHEXOL 9 MG/ML PO SOLN
500.0000 mL | ORAL | Status: AC
  Administered 2020-05-28 (×2): 500 mL via ORAL

## 2020-05-28 MED ORDER — VITAL 1.5 CAL PO LIQD
1000.0000 mL | ORAL | Status: DC
  Administered 2020-05-28: 1000 mL
  Filled 2020-05-28 (×3): qty 1000

## 2020-05-28 MED ORDER — MIDODRINE HCL 5 MG PO TABS
10.0000 mg | ORAL_TABLET | Freq: Three times a day (TID) | ORAL | Status: DC
  Administered 2020-05-28 – 2020-05-29 (×5): 10 mg via ORAL
  Filled 2020-05-28 (×5): qty 2

## 2020-05-28 MED ORDER — ONDANSETRON HCL 4 MG/2ML IJ SOLN
4.0000 mg | Freq: Four times a day (QID) | INTRAMUSCULAR | Status: DC | PRN
  Administered 2020-06-07: 4 mg via INTRAVENOUS
  Filled 2020-05-28: qty 2

## 2020-05-28 MED ORDER — B COMPLEX-C PO TABS
1.0000 | ORAL_TABLET | Freq: Every day | ORAL | Status: DC
  Administered 2020-05-28 – 2020-06-02 (×5): 1 via ORAL
  Filled 2020-05-28 (×5): qty 1

## 2020-05-28 MED ORDER — CHLORHEXIDINE GLUCONATE 0.12 % MT SOLN
OROMUCOSAL | Status: AC
  Administered 2020-05-28: 15 mL via OROMUCOSAL
  Filled 2020-05-28: qty 15

## 2020-05-28 NOTE — Progress Notes (Signed)
Nutrition Follow-up  RD working remotely.  DOCUMENTATION CODES:   Obesity unspecified  INTERVENTION:   Tube feeding via Cortrak: - Change to Vital 1.5 @ 60 ml/hr (1440 ml/day) - ProSource Tube Feeding 90 ml BID  Tube feeding regimen provides 2320 kcal, 141 grams of protein, and 1100 ml of H2O.  - Magic cup TID with meals, each supplement provides 290 kcal and 9 grams of protein  - B-complex with vitamin C  NUTRITION DIAGNOSIS:   Increased nutrient needs related to acute illness (Renal failure on CRRT) as evidenced by estimated needs.  Ongoing  GOAL:   Patient will meet greater than or equal to 90% of their needs  Met via TF  MONITOR:   PO intake, Supplement acceptance, Diet advancement, Labs, Weight trends, TF tolerance, Skin, I & O's  REASON FOR ASSESSMENT:   Consult Enteral/tube feeding initiation and management  ASSESSMENT:   74 yo male admitted 7/9 S/P right nephrectomy for large mass. Transferred to the ICU and required intubation on 7/13 due to shock and worsening respiratory failure. PMH includes HTN, cardiomyopathy, DM2, prostate CA, CKD, HLD, gout.  7/14 - CRRT initiated, TF initiated 7/17 - extubated 7/18 - CRRT d/c 7/19 - Cortrak placed, tip gastric per Cortrak team 7/20 - CRRT restarted, s/p FEES and diet advanced to dysphagia 2 with nectar-thick liquids  No meal completions documented since diet advanced yesterday afternoon. Recommend continuing tube feeds via Cortrak at this time given increased kcal and protein needs as pt is on CRRT.  Pt remains on phenylephrine for blood pressure support, currently infusing at 7.5 ml/hr per [facility].  RD will order Magic Cups with meals to aid pt in meeting kcal and protein needs via PO.  Weight down a total of 11.9 kg since admit. EDW: 120.1 kg (lowest and current weight). Per RN edema assessment, pt with non-pitting edema to BUE and BLE.  Current TF: Nepro @ 45 ml/hr, ProSource Tube Feeding 90 ml  BID  Medications reviewed and include: aranesp, SSI, Levemir 5 units daily, miralax, senokot, ferric gluconate  Labs reviewed: phos and K+ WNL, magnesium 2.9, hemoglobin 8.4 CBG's: 135-185 x 24 hours  CRRT net UF: 1184 ml x 24 hours I/O's: +4.3 L since admit  NUTRITION - FOCUSED PHYSICAL EXAM:  Unable to complete at this time. RD working remotely.  Diet Order:   Diet Order            DIET DYS 2 Room service appropriate? No; Fluid consistency: Nectar Thick  Diet effective now                 EDUCATION NEEDS:   Not appropriate for education at this time  Skin:  Skin Assessment: Skin Integrity Issues: Incisions: closed abdomen Other: puncture wound to neck (right IJ)  Last BM:  05/28/20 medium type 7  Height:   Ht Readings from Last 1 Encounters:  05/16/20 6' 2"  (9.67 m)    Weight:   Wt Readings from Last 1 Encounters:  05/28/20 120.1 kg    Ideal Body Weight:  86.4 kg  BMI:  Body mass index is 33.99 kg/m.  Estimated Nutritional Needs:   Kcal:  5916-3846  Protein:  135-150 grams  Fluid:  1 L + UOP    Gaynell Face, MS, RD, LDN Inpatient Clinical Dietitian Please see AMiON for contact information.

## 2020-05-28 NOTE — Progress Notes (Signed)
SLP Cancellation Note  Patient Details Name: Todd Robertson MRN: [id number] DOB: 1946-06-15   Cancelled treatment:        Checked on pt earlier and RN reported pt nauseated, vomiting and is having abdominal CT. Will hold dysphagia tx for today and plan to return tomorrow.   [name] 05/28/2020, 2:45 PM   [name] [name].Ed Risk analyst [phone] Office [phone]

## 2020-05-28 NOTE — Progress Notes (Signed)
Acute inpatient rehab admissions:  I met with patient and his family at his bedside earlier today.  I briefly discussed inpatient rehab with him and family and gave brochures to family to review.  Patient was scheduled for a procedure.  Will follow up once patient/family are available.  Call for questions.  [phone]

## 2020-05-28 NOTE — Progress Notes (Signed)
[facility] Progress Note  Interval events/Subjective:   Seen on crrt, tolerating treatment. Patient reports feeling much better especially as he was able to drink something. Patient reports feeling confused as to why he is here and how he got here. When prompted, he denied chest pain, SOB, abdominal pain.  Still on phenylephrine  Vitals:   05/28/20 0730 05/28/20 0745 05/28/20 0800 05/28/20 0820  BP: (!) 91/55 91/70 110/71   Pulse: 82 79 94   Resp: _0 Temp: 98.2 F (36.8 C)     TempSrc: Oral     SpO2: 100% 95% 99% 100%  Weight:      Height:        Exam: Gen: nad Heent: +ngt CV: rrr, s1s2, no m/r/g Resp: cta bl, slightly diminished bibasilar, no overt w/r/r/c, normal wob Abd: lap wounds, distended Ext: trace edema b/l ue's Neuro: awake, following commands Rt temp hd cath (trialysis)    Home meds:  - asa 88m/ lipitor 40 hs/ benazepril 20 am/ bumex 148mqd/ coreg 25 bid  - allopurinol 300 hs  - symbicort ACT 2 puff bid  - prn's/ vitamins/ supplements  Date                             Creat               egFR   2018                          1.79- 2.38        30- 43   2019                          1.80- 1.81        42-43   January 12, 2020           3.00                    May 13, 2020                2.60                 27   Jul 9                           2.51                 29- 33   Jul 10                         3.42, 3.89    UA none yet   CT abd wo contrast 7/9 - Adrenals/Urinary Tract: Post right nephrectomy. Large volume of hemorrhage in the right nephrectomy bed. Ill-defined air fluid collection tracks in the right retroperitoneum anterior to the iliopsoas muscle and laterally in the abdominal cavity. Hemorrhage tracks into the right aspect of the pelvis in the is only partially included in the field of view. There is moderate subcutaneous emphysema involving the right abdominal wall. Small foci of air dissects between the right abdominal  wall musculature. Simple cyst in the left kidney. There is no left hydronephrosis.        Recent Labs  Lab 05/27/20 1124 05/27/20 1515 05/27/20 2038 05/28/20 0303  K  --  3.9  --  4.1  BUN  --  82*  --  64*  CREATININE  --  8.44*  --  6.45*  CALCIUM  --  8.4*  --  8.5*  PHOS  --  4.1  --  2.8  HGB   < >  --  7.7* 8.4*   < > = values in this interval not displayed.   Inpatient medications: . arformoterol  15 mcg Nebulization BID  . aspirin  81 mg Oral Daily  . budesonide (PULMICORT) nebulizer solution  0.5 mg Nebulization BID  . chlorhexidine gluconate (MEDLINE KIT)  15 mL Mouth Rinse BID  . Chlorhexidine Gluconate Cloth  6 each Topical Daily  . darbepoetin (ARANESP) injection - NON-DIALYSIS  60 mcg Subcutaneous Q Sat-1800  . feeding supplement (PROSource TF)  90 mL Per Tube BID  . heparin injection (subcutaneous)  5,000 Units Subcutaneous Q8H  . insulin aspart  0-9 Units Subcutaneous Q4H  . insulin detemir  5 Units Subcutaneous QHS  . mouth rinse  15 mL Mouth Rinse q12n4p  . melatonin  3 mg Per Tube QHS  . midodrine  5 mg Oral TID WC  . polyethylene glycol  17 g Per Tube Daily  . sennosides  5 mL Per Tube BID  . sodium chloride flush  10-40 mL Intracatheter Q12H   .  prismasol BGK 4/2.5 400 mL/hr at 05/28/20 0401  . sodium chloride Stopped (05/27/20 0829)  . sodium chloride    . feeding supplement (NEPRO CARB STEADY) 45 mL/hr at 05/27/20 0800  . ferric gluconate (FERRLECIT/NULECIT) IV Stopped (05/27/20 1503)  . phenylephrine (NEO-SYNEPHRINE) Adult infusion 50 mcg/min (05/28/20 0800)  . prismasol BGK 2/2.5 replacement solution 300 mL/hr at 05/27/20 1549  . prismasol BGK 4/2.5 1,800 mL/hr at 05/28/20 1884   sodium chloride, acetaminophen **OR** acetaminophen, albuterol, alum & mag hydroxide-simeth, diphenhydrAMINE **OR** diphenhydrAMINE, heparin, menthol-cetylpyridinium, ondansetron, oxyCODONE, promethazine, Resource ThickenUp Clear, sodium chloride flush,  tiZANidine   Assessment/ Plan: 1. Dialysis dependent AoCKD IV - b/l creat 2.60, eGFR 27 then s/p nephrectomy with resultant progressive renal insufficiency and oliguria.  HD catheter placed IR 7/13 - not functional so replaced bedside 7/15 by PCCM.  Off CRRT 7/18. 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  Will need transition to tunneled cath at some point.   Continue with crrt for now, no changes 2. Renal mass - sp R nephrectomy 05/16/20; neg margins on path 3. Anemia ckd / abl - hgb stable today, transfusion/mgmt per primary team. CT 7/9 with hemorrhage in nephrectomy bed - Transfuse per primary. Iron indices show tsat 10%.   Margins were negative, started ESA: aranesp 100 qSat.   Was holding IV iron in setting of septic shock. Started IV Fe load -- TSAT 10% 7/13 4. Shock, presumed septic? Other etiologies being investigated- echo pending, requiring phenylephrine, can consider increasing his midodrine to 25m TID. Given his clinical situation with shock req'ing pressors along with worsening leukocytosis would be okay from a nephrology perspective to utilize contrast if he were to go for a ct scan given risks and benefits. Possible that he has an infected hematoma? 5. Hyponatremia - resolved 6. Hyperkalemia - resolved, 7. Leukocytosis: worsening, afebrile. See #4  VGean Quint MD [facility]

## 2020-05-28 NOTE — Progress Notes (Signed)
NAME:  Todd Robertson, MRN:  [id number], DOB:  1946-09-17, LOS: 12 ADMISSION DATE:  05/16/2020, CONSULTATION DATE:  05/16/2020 REFERRING MD:  Dr Alyson Ingles, CHIEF COMPLAINT:  Shock   Brief History   74 yo male former smoker presented on 7/09 for Rt nephrectomy in setting of renal cell carcinoma.  Post op developed hypotension and PCCM consulted.  Past Medical History  OA, Prostate cancer, Cardiomyopathy (? 2018 LVEF 55-60%), CKD, DM 2, Gout, HTN, Asthma  Significant Hospital Events   7/09 Rt nephrectomy 7/13 Rt IJ HD catheter by IR; worsening hypercapnia and AMS >> intubated, start ABx for HCAP 7/14 start CRRT 7/18 off CRRT  Consults:  Nephrology  Procedures:  Rt IJ HD catheter 7/13 >> ETT 7/13 >> 7/17 Rt radial a line 7/15 > 7/18  Significant Diagnostic Tests:   CT abd/pelvis 7/09 >> s/p Rt nephrectomy.  Large volume of Rt retroperitoneal hemorrhage, moderate subcutaneous emphysema Rt abdominal wall  CT abd/pelvis 7/15 >> decreasing postoperative hemorrhage and subcutaneous emphysema, patchy consolidation b/l  Micro Data:  MRSA screen 7/09 >> negative Sputum 7/14 >> normal flora Blood 7/14 >>NG   Antimicrobials:  Cefepime 7/13 >> Vancomycin 7/13 >> 7/17  Interim history/subjective:  Had episodes of vomiting overnight after dinner.  Complaining indigestion this morning.  No chest pain.  Slept better overnight.  Objective   Blood pressure 110/71, pulse 94, temperature 98.2 F (36.8 C), temperature source Oral, resp. rate 20, height 6\' 2"  (1.88 m), weight 120.1 kg, SpO2 100 %.        Intake/Output Summary (Last 24 hours) at 05/28/2020 3846 Last data filed at 05/28/2020 0800 Gross per 24 hour  Intake 2088.99 ml  Output 1282 ml  Net 806.99 ml   Filed Weights   05/26/20 0317 05/27/20 1500 05/28/20 0300  Weight: 121.4 kg 122.5 kg 120.1 kg    Examination:  General -chronically ill-appearing man sitting up in bed in no acute distress HEENT: [place]/AT, NGT Cardiac  -regular rate and rhythm, no murmurs Chest -breathing comfortably on nasal cannula, CTA B. Abdomen -soft, nontender to palpation. Extremities -minimal lower extremity edema.  No clubbing or cyanosis Skin -no rashes or petechiae Neuro -slightly more alert today.  Globally weak.   Resolved Hospital Problem list   Septic shock, Acute metabolic encephalopathy 2nd to hypercapnia and renal failure  Assessment & Plan:   Shock requiring reinitiation of CRRT- undifferentiated shock -Echocardiogram pending -Repeat blood cultures pending -Increase midodrine to 10 mg 3 times daily -Continue Neosynephrine to maintain MAP greater than 65 -CT abdomen pelvis to evaluate for potential sources of infection.  Acute hypoxic/hypercapnic respiratory failure HAP- resolving Asthma with hx of tobacco abuse. -mobility as much as possible, pulmonary hygiene Titrate down supplemental oxygen as able to maintain SPO2 greater than 90%  Persistent leukocytosis since 7/10. No other clear s/s of infection other than improving HAP. Negative blood and respiratory cultures from 7/14. -Blood culture 7/20 pending -Repeat CT abdomen pelvis to evaluate for potential sources of infection. Discussed with nephrology- ok with contrast.  AKI on CKD 3 from ATN in setting of septic shock. Hypotension barring iHD. Discussed with nephrology this morning who would like to resume CRRT. -CRRT per nephrology -Strict I's/O -Renally dose medications -Nepro tube feeds  S/p Rt nephrectomy for renal cell carcinoma. -Appreciate urology's assistance with postop care.  Anemia from acute blood loss after surgery and chronic disease. hgb 6.2 this morning. Transfused 1U PRBC. No significant hemodynamic changes to suggest acute process. -Daily CBC unless  signs of bleeding -Transfuse for hemoglobin less than 7 or hemodynamically significant bleeding -We will defer to nephrology decision for iron infusion and bowel  DM type 2,  hyperglycemia controlled  -Continue Levemir 5 units daily -Sliding scale insulin as needed -Goal BG 140-180 while admitted to the ICU  Chronic HFpEF, HLD.  -Monitoring on telemetry  Hx of gout -Holding PTA allopurinol -Dialysis  Constipation, resolved -Hold today  Deconditioning -Continue PT and OT   Delirium with prominent negative symptoms -OOB mobility; appreciate PT's assistance -day-night reorientation -Continue nightly melatonin -Delirium precautions    Best practice:  Diet: tube feeds, modified diet DVT prophylaxis: subcut heparin GI prophylaxis: n/a Mobility: bedrest Code Status: full Disposition: ICU Family: Family updated at bedside daily  Labs    CMP Latest Ref Rng & Units 05/28/2020 05/27/2020 05/27/2020  Glucose 70 - 99 mg/dL 170(H) 205(H) 174(H)  BUN 8 - 23 mg/dL 64(H) 82(H) 71(H)  Creatinine 0.61 - 1.24 mg/dL 6.45(H) 8.44(H) 7.63(H)  Sodium 135 - 145 mmol/L 140 139 141  Potassium 3.5 - 5.1 mmol/L 4.1 3.9 3.9  Chloride 98 - 111 mmol/L 106 105 106  CO2 22 - 32 mmol/L 21(L) 22 24  Calcium 8.9 - 10.3 mg/dL 8.5(L) 8.4(L) 8.6(L)  Total Protein 6.5 - 8.1 g/dL - - 5.1(L)  Total Bilirubin 0.3 - 1.2 mg/dL - - 1.9(H)  Alkaline Phos 38 - 126 U/L - - 72  AST 15 - 41 U/L - - 86(H)  ALT 0 - 44 U/L - - 33    CBC Latest Ref Rng & Units 05/28/2020 05/27/2020 05/27/2020  WBC 4.0 - 10.5 K/uL 22.2(H) 21.5(H) 18.9(H)  Hemoglobin 13.0 - 17.0 g/dL 8.4(L) 7.7(L) 7.1(L)  Hematocrit 39 - 52 % 27.2(L) 24.9(L) 23.3(L)  Platelets 150 - 400 K/uL 186 192 173    ABG    Component Value Date/Time   PHART 7.409 05/23/2020 0400   PCO2ART 38.5 05/23/2020 0400   PO2ART 85 05/23/2020 0400   HCO3 24.4 05/23/2020 0400   TCO2 26 05/23/2020 0400   ACIDBASEDEF 1.0 05/20/2020 2308   O2SAT 97.0 05/23/2020 0400    CBG (last 3)  Recent Labs    05/27/20 2344 05/28/20 0348 05/28/20 0730  GLUCAP 139* 168* 135*       This patient is critically ill with multiple organ system  failure which requires frequent high complexity decision making, assessment, support, evaluation, and titration of therapies. This was completed through the application of advanced monitoring technologies and extensive interpretation of multiple databases. During this encounter critical care time was devoted to patient care services described in this note for 35 minutes.  Julian Hy, DO 05/28/20 9:03 AM [facility]

## 2020-05-28 NOTE — Progress Notes (Signed)
Physical Therapy Treatment Patient Details Name: Todd Robertson MRN: [id number] DOB: 1945/12/18 Today's Date: 05/28/2020    History of Present Illness 74 year old male with h/o prostate CA, DM2, gout, HTN, presenting 7/9 for planned right Nephrectomy. Went to the OR that day. EBL estimated at 349ml. Intraoperative anesthesia note unremarkable. While in the PACU had declining w/ systolic BPs in 67H. Placed in Trendelenburg; IVF boluses administered. BP normalized but only made 68ml of urine.  Because of this PCCM was asked to assess for circulatory shock and concern about hemodynamics    PT Comments    Session limited by CRRT and pt's tiredness.  Emphasis on transitions, sit to stand, standing balance and pregait activity to get up to [facility].    Follow Up Recommendations  CIR     Equipment Recommendations  Other (comment) (TBA)    Recommendations for Other Services       Precautions / Restrictions Precautions Precautions: Fall    Mobility  Bed Mobility Overal bed mobility: Needs Assistance Bed Mobility: Sit to Supine;Supine to Sit     Supine to sit: Min assist Sit to supine: Min assist   General bed mobility comments: cues for direction, truncal assist up and LE's assist back in the bed.  Transfers Overall transfer level: Needs assistance Equipment used: 1 person hand held assist Transfers: Sit to/from [facility] Sit to Stand: Min assist;+2 safety/equipment Stand pivot transfers: Min assist;+2 safety/equipment       General transfer comment: cue for direction and hand placement with assist to come forward and boost  x2, pt was able to pivot and step up toward [facility] x2  Ambulation/Gait             General Gait Details: NT,  pt on CRRT   Stairs             Wheelchair Mobility    Modified Rankin (Stroke Patients Only)       Balance Overall balance assessment: Needs assistance Sitting-balance support: Bilateral upper extremity  supported;No upper extremity supported Sitting balance-Leahy Scale: Fair Sitting balance - Comments: generally more steady thane on evaluation   Standing balance support: No upper extremity supported;Bilateral upper extremity supported Standing balance-Leahy Scale: Poor Standing balance comment: reliant on external assist                            Cognition Arousal/Alertness: Awake/alert Behavior During Therapy: WFL for tasks assessed/performed Overall Cognitive Status: Impaired/Different from baseline (NT formally, following simple commands)                                        Exercises Other Exercises Other Exercises: warm up BIL LE ROM exercise prior to mobility with resistande in gross extension    General Comments General comments (skin integrity, edema, etc.): vss on CRRT      Pertinent Vitals/Pain Pain Assessment: Faces Faces Pain Scale: No hurt Pain Intervention(s): Monitored during session    Home Living                      Prior Function            PT Goals (current goals can now be found in the care plan section) Acute Rehab PT Goals Patient Stated Goal: back home independent, family wants some rehab prior to home. PT Goal  Formulation: With patient/family Time For Goal Achievement: 06/09/20 Potential to Achieve Goals: Good Progress towards PT goals: Progressing toward goals    Frequency    Min 3X/week      PT Plan Current plan remains appropriate    Co-evaluation              AM-PAC PT "6 Clicks" Mobility   Outcome Measure  Help needed turning from your back to your side while in a flat bed without using bedrails?: A Little Help needed moving from lying on your back to sitting on the side of a flat bed without using bedrails?: A Little Help needed moving to and from a bed to a chair (including a wheelchair)?: A Little Help needed standing up from a chair using your arms (e.g., wheelchair or bedside  chair)?: A Little Help needed to walk in hospital room?: A Lot Help needed climbing 3-5 steps with a railing? : A Lot 6 Click Score: 16    End of Session Equipment Utilized During Treatment: Oxygen Activity Tolerance: Patient tolerated treatment well;Patient limited by fatigue Patient left: in bed;with call bell/phone within reach;with bed alarm set;with family/visitor present Nurse Communication: Mobility status PT Visit Diagnosis: Unsteadiness on feet (R26.81);Difficulty in walking, not elsewhere classified (R26.2);Muscle weakness (generalized) (M62.81)     Time: 1542-1600 PT Time Calculation (min) (ACUTE ONLY): 18 min  Charges:  $Therapeutic Activity: 8-22 mins                     05/28/2020  Todd Robertson., PT Acute Rehabilitation Services [phone]  (pager) [phone]  (office)   Todd Robertson 05/28/2020, 5:37 PM

## 2020-05-28 NOTE — Progress Notes (Signed)
  Echocardiogram 2D Echocardiogram has been performed.  Todd Robertson 05/28/2020, 2:36 PM

## 2020-05-28 NOTE — Progress Notes (Signed)
12 Days Post-Op   Subjective/Chief Complaint:  On IHD yesterday and today, tolerating it well, 1.1L removed yesterday.  New nausea/emesis this AM, and increasing leukocytosis to 22. Remains on phenylephrine. Afebrile. CT scan with no acute abdominal pathology, hematoma decreasing in size. Large volume diarrhea this afternoon. Much more alert and responsive.   Objective: Vital signs in last 24 hours: Temp:  [97.7 F (36.5 C)-98.8 F (37.1 C)] 98.7 F (37.1 C) (07/21 1230) Pulse Rate:  [69-110] 82 (07/21 1400) Resp:  [13-29] 17 (07/21 1400) BP: (74-118)/(42-91) 81/54 (07/21 1400) SpO2:  [94 %-100 %] 94 % (07/21 1400) Weight:  [120.1 kg] 120.1 kg (07/21 0300) Last BM Date: 05/28/20  Intake/Output from previous day: 07/20 0701 - 07/21 0700 In: 2066.5 [P.O.:125; I.V.:524.1; Blood:232; NG/GT:975; IV Piggyback:210.4] Out: 1184  Intake/Output this shift: Total I/O In: 879.2 [I.V.:37.6; NG/GT:735; IV Piggyback:106.7] Out: 646 [Other:646]  General appearance: Awake, in no distress Resp: breathing comfortably on [place] [facility]: HR 70s on monitor, regular.  Extremities: edema improveing Abd: Obese, soft, non-distended, tympantic. Right flank hematoma improving  Lab Results:  Recent Labs    05/27/20 2038 05/28/20 0303  WBC 21.5* 22.2*  HGB 7.7* 8.4*  HCT 24.9* 27.2*  PLT 192 186   BMET Recent Labs    05/27/20 1515 05/28/20 0303  NA 139 140  K 3.9 4.1  CL 105 106  CO2 22 21*  GLUCOSE 205* 170*  BUN 82* 64*  CREATININE 8.44* 6.45*  CALCIUM 8.4* 8.5*   PT/INR No results for input(s): LABPROT, INR in the last 72 hours. ABG No results for input(s): PHART, HCO3 in the last 72 hours.  Invalid input(s): PCO2, PO2  Studies/Results: CT ABDOMEN PELVIS W CONTRAST  Result Date: 05/28/2020 CLINICAL DATA:  Sepsis.  Hematoma following nephrectomy. EXAM: CT ABDOMEN AND PELVIS WITH CONTRAST TECHNIQUE: Multidetector CT imaging of the abdomen and pelvis was performed using the standard  protocol following bolus administration of intravenous contrast. CONTRAST:  126mL OMNIPAQUE IOHEXOL 300 MG/ML  SOLN COMPARISON:  CT of the abdomen and pelvis without contrast 05/22/2020 FINDINGS: Lower chest: Bibasilar airspace disease has improved, right greater than left. Left pleural effusion has resolved. The right pleural effusion has decreased somewhat. The heart size is normal. No significant pericardial effusion is present. Hepatobiliary: No focal liver abnormality is seen. No gallstones, gallbladder wall thickening, or biliary dilatation. Pancreas: Unremarkable. No pancreatic ductal dilatation or surrounding inflammatory changes. Spleen: Normal in size without focal abnormality. Adrenals/Urinary Tract: Adrenal glands are within normal limits bilaterally. Simple cyst is again seen in the left kidney. Left kidney is otherwise unremarkable. No stone or mass lesion is present otherwise. No obstruction is present. The ureter is normal. The urinary bladder is mostly collapsed. Right nephrectomy is noted. Next density hematoma is slightly smaller than on the prior exam. No enhancing abscess is present. Hemorrhage has extended more inferiorly in the retroperitoneal space. No new hemorrhage is present. Stomach/Bowel: Stomach is somewhat distended despite OG tube in place. Small bowel is unremarkable. Ascending colon is normal. Transverse colon is moderately dilated similar the prior exam. More distal transverse colon and descending colon are within normal limits. Vascular/Lymphatic: Atherosclerotic calcifications are present the aorta branch vessels. No aneurysm is present. No significant free air is present. Reproductive: Metallic seeds are again noted. Other: Subcutaneous gas over the right side of the abdomen is improving. No subcutaneous abscess is present. Musculoskeletal: Degenerative anterolisthesis is present at L4-5. Degenerative retrolisthesis present at L5-S1. Vertebral body heights are maintained. Vacuum  disc is present at L2-3, L4-5, and L5-S1. Vacuum phenomenon is present in the facet joints on the left at L4-5. Bony pelvis is normal. The hips are located and within normal limits. IMPRESSION: 1. Slight decrease in size of right retroperitoneal hematoma following right nephrectomy. 2. No enhancing abscess is present. 3. Improved subcutaneous gas over the right side of the abdomen. 4. Improved bibasilar airspace disease and effusions, right greater than left. 5. Stable moderate dilation of the transverse colon. 6. Stable dilation of the stomach despite OG tube in place 7. Multilevel degenerative changes in the lumbar spine. 8. Aortic Atherosclerosis (ICD10-I70.0). Electronically Signed   By: San Morelle M.D.   On: 05/28/2020 14:04   DG CHEST PORT 1 VIEW  Result Date: 05/26/2020 CLINICAL DATA:  Check feeding tube placement EXAM: PORTABLE CHEST 1 VIEW COMPARISON:  05/26/2020, 05/23/2020 FINDINGS: Incompletely visualized right central venous catheter tip over the SVC. Removal of previously noted left jugular central venous catheter. Esophageal tube tip is below the diaphragm but incompletely included. Left basilar airspace disease. IMPRESSION: 1. Esophageal tube tip below the diaphragm but incompletely included. 2. Left basilar airspace disease. Removal of left-sided central venous catheter. Electronically Signed   By: Donavan Foil M.D.   On: 05/26/2020 20:05    Anti-infectives: Anti-infectives (From admission, onward)   Start     Dose/Rate Route Frequency Ordered Stop   05/27/20 1530  ceFEPIme (MAXIPIME) 2 g in sodium chloride 0.9 % 100 mL IVPB        2 g 200 mL/hr over 30 Minutes Intravenous  Once 05/27/20 1140 05/27/20 1851   05/26/20 2200  ceFEPIme (MAXIPIME) 1 g in sodium chloride 0.9 % 100 mL IVPB  Status:  Discontinued        1 g 200 mL/hr over 30 Minutes Intravenous Every 24 hours 05/25/20 1342 05/27/20 1140   05/22/20 1200  vancomycin (VANCOREADY) IVPB 1250 mg/250 mL  Status:   Discontinued        1,250 mg 166.7 mL/hr over 90 Minutes Intravenous Every 24 hours 05/21/20 1347 05/24/20 0855   05/22/20 0400  vancomycin (VANCOREADY) IVPB 1250 mg/250 mL  Status:  Discontinued        1,250 mg 166.7 mL/hr over 90 Minutes Intravenous Every 24 hours 05/21/20 0218 05/21/20 1201   05/21/20 1201  vancomycin variable dose per unstable renal function (pharmacist dosing)  Status:  Discontinued         Does not apply See admin instructions 05/21/20 1201 05/22/20 0708   05/21/20 0315  vancomycin (VANCOCIN) 2,500 mg in sodium chloride 0.9 % 500 mL IVPB        2,500 mg 250 mL/hr over 120 Minutes Intravenous  Once 05/21/20 0218 05/21/20 0540   05/21/20 0315  ceFEPIme (MAXIPIME) 2 g in sodium chloride 0.9 % 100 mL IVPB  Status:  Discontinued        2 g 200 mL/hr over 30 Minutes Intravenous Every 12 hours 05/21/20 0218 05/25/20 1342   05/16/20 1700  ceFAZolin (ANCEF) IVPB 1 g/50 mL premix        1 g 100 mL/hr over 30 Minutes Intravenous Every 8 hours 05/16/20 1600 05/17/20 0206   05/16/20 0600  ceFAZolin (ANCEF) 3 g in dextrose 5 % 50 mL IVPB        3 g 100 mL/hr over 30 Minutes Intravenous 30 min pre-op 05/15/20 0725 05/16/20 1944      Assessment/Plan:  1 - RIGHT Renal Neoplasm - s/p nephrectomy. no further cancer directed  therapy this admission.    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 Appreciate recommendations of Nephrology regarding long term dialysis plan  3 - Uremic encephalopathy / ICU delirium - continues to improve   4 - Fever / PNA / leukocytosis - on cefepime, management per ICU team. WBC to 22, requiring pressor support. No evidence of intra-abdominal infection on CT scan, and improving PNA.    5- Acute Blood Loss Anemia - No evidence of active post operative bleeding. Hematoma again smaller on CT today.  Agree with PRN transfusion if needed from a cardiovascular/hemodynamic  standpoint. Stable from a urologic standpoint to resume DVT ppx.  6 - Constipation - no BM for past 6 days, large diarrhea today. Continue bowel regimen   Carmie Kanner 05/28/2020

## 2020-05-28 NOTE — Progress Notes (Signed)
Attempted echo.  Patient heading to CT per conversation w/ nurse. Will attempt later.

## 2020-05-29 ENCOUNTER — Inpatient Hospital Stay (HOSPITAL_COMMUNITY): Payer: Medicare Other

## 2020-05-29 DIAGNOSIS — R609 Edema, unspecified: Secondary | ICD-10-CM

## 2020-05-29 LAB — CBC
HCT: 29.2 % — ABNORMAL LOW (ref 39.0–52.0)
Hemoglobin: 9.1 g/dL — ABNORMAL LOW (ref 13.0–17.0)
MCH: 29.7 pg (ref 26.0–34.0)
MCHC: 31.2 g/dL (ref 30.0–36.0)
MCV: 95.4 fL (ref 80.0–100.0)
Platelets: 240 10*3/uL (ref 150–400)
RBC: 3.06 MIL/uL — ABNORMAL LOW (ref 4.22–5.81)
RDW: 18.6 % — ABNORMAL HIGH (ref 11.5–15.5)
WBC: 20.5 10*3/uL — ABNORMAL HIGH (ref 4.0–10.5)
nRBC: 0 % (ref 0.0–0.2)

## 2020-05-29 LAB — RENAL FUNCTION PANEL
Albumin: 2.1 g/dL — ABNORMAL LOW (ref 3.5–5.0)
Albumin: 2.1 g/dL — ABNORMAL LOW (ref 3.5–5.0)
Anion gap: 12 (ref 5–15)
Anion gap: 8 (ref 5–15)
BUN: 45 mg/dL — ABNORMAL HIGH (ref 8–23)
BUN: 38 mg/dL — ABNORMAL HIGH (ref 8–23)
CO2: 20 mmol/L — ABNORMAL LOW (ref 22–32)
CO2: 27 mmol/L (ref 22–32)
Calcium: 8.1 mg/dL — ABNORMAL LOW (ref 8.9–10.3)
Calcium: 8.4 mg/dL — ABNORMAL LOW (ref 8.9–10.3)
Chloride: 104 mmol/L (ref 98–111)
Chloride: 103 mmol/L (ref 98–111)
Creatinine, Ser: 4.12 mg/dL — ABNORMAL HIGH (ref 0.61–1.24)
Creatinine, Ser: 3.67 mg/dL — ABNORMAL HIGH (ref 0.61–1.24)
GFR calc Af Amer: 16 mL/min — ABNORMAL LOW (ref >60)
GFR calc Af Amer: 18 mL/min — ABNORMAL LOW (ref >60)
GFR calc non Af Amer: 14 mL/min — ABNORMAL LOW (ref >60)
GFR calc non Af Amer: 16 mL/min — ABNORMAL LOW (ref >60)
Glucose, Bld: 191 mg/dL — ABNORMAL HIGH (ref 70–99)
Glucose, Bld: 180 mg/dL — ABNORMAL HIGH (ref 70–99)
Phosphorus: 2.5 mg/dL (ref 2.5–4.6)
Phosphorus: 2.3 mg/dL — ABNORMAL LOW (ref 2.5–4.6)
Potassium: 4.2 mmol/L (ref 3.5–5.1)
Potassium: 4.4 mmol/L (ref 3.5–5.1)
Sodium: 136 mmol/L (ref 135–145)
Sodium: 138 mmol/L (ref 135–145)

## 2020-05-29 LAB — GLUCOSE, CAPILLARY
Glucose-Capillary: 159 mg/dL — ABNORMAL HIGH (ref 70–99)
Glucose-Capillary: 158 mg/dL — ABNORMAL HIGH (ref 70–99)
Glucose-Capillary: 178 mg/dL — ABNORMAL HIGH (ref 70–99)
Glucose-Capillary: 142 mg/dL — ABNORMAL HIGH (ref 70–99)
Glucose-Capillary: 181 mg/dL — ABNORMAL HIGH (ref 70–99)
Glucose-Capillary: 157 mg/dL — ABNORMAL HIGH (ref 70–99)

## 2020-05-29 LAB — MAGNESIUM: Magnesium: 2.6 mg/dL — ABNORMAL HIGH (ref 1.7–2.4)

## 2020-05-29 LAB — CORTISOL: Cortisol, Plasma: 23.1 ug/dL

## 2020-05-29 MED ORDER — MIDODRINE HCL 5 MG PO TABS
15.0000 mg | ORAL_TABLET | Freq: Three times a day (TID) | ORAL | Status: DC
  Administered 2020-05-30 – 2020-06-05 (×19): 15 mg via ORAL
  Filled 2020-05-29 (×20): qty 3

## 2020-05-29 MED ORDER — VITAL 1.5 CAL PO LIQD
1000.0000 mL | ORAL | Status: DC
  Administered 2020-05-29 – 2020-06-01 (×3): 1000 mL
  Filled 2020-05-29 (×2): qty 1000

## 2020-05-29 NOTE — Progress Notes (Signed)
Brief Nutrition Note  Spoke with RN who reports pt experienced large volume projectile emesis earlier this afternoon. TF stopped. Discussed with CCM and verbal with readback order placed for Cortrak team to advance Cortrak tube post-pyloric. Cortrak team available tomorrow.  Adjusted TF orders. Plan is to restart tube feeds at 20 ml/hr and not advance further at this time (goal rate is 60 ml/hr). RD will reevaluate tomorrow for possible tube feeding rate advancement.   Gaynell Face, MS, RD, LDN Inpatient Clinical Dietitian Please see [facility] for contact information.

## 2020-05-29 NOTE — Progress Notes (Signed)
Physical Therapy Treatment Patient Details Name: Todd Robertson MRN: [id number] DOB: 07-12-1946 Today's Date: 05/29/2020    History of Present Illness 74 year old male with h/o prostate CA, DM2, gout, HTN, presenting 7/9 for planned right Nephrectomy. Went to the OR that day. EBL estimated at 343ml. Intraoperative anesthesia note unremarkable. While in the PACU had declining w/ systolic BPs in 40J. Placed in Trendelenburg; IVF boluses administered. BP normalized but only made 63ml of urine.  Because of this PCCM was asked to assess for circulatory shock and concern about hemodynamics    PT Comments    Pt under the "bear hugger" on arrival.  Express not feeling well today, but agreed to try working with therapies.  Emphasis on transition to EOB, sitting balance, sit to stand.  Pt became more light headed and mildly nauseous in standing.   Follow Up Recommendations  CIR     Equipment Recommendations  Other (comment) (TBA)    Recommendations for Other Services       Precautions / Restrictions Precautions Precautions: Fall Precaution Comments: CRRT Restrictions Weight Bearing Restrictions: No    Mobility  Bed Mobility Overal bed mobility: Needs Assistance Bed Mobility: Supine to Sit;Sit to Supine     Supine to sit: Min assist Sit to supine: Min assist   General bed mobility comments: cues for direction, truncal assist up and LE's assist back in the bed. Limited by pt dizziness and nausea  Transfers Overall transfer level: Needs assistance Equipment used: 1 person hand held assist Transfers: Sit to/from Stand Sit to Stand: Min assist;+2 safety/equipment Stand pivot transfers: Min assist;+2 safety/equipment       General transfer comment: cues for hand placement, steady assist as pt looked unwell in standing and reported lightheaded.  Ambulation/Gait             General Gait Details: NT,  pt on CRRT   Stairs             Wheelchair Mobility     Modified Rankin (Stroke Patients Only)       Balance Overall balance assessment: Needs assistance Sitting-balance support: Bilateral upper extremity supported;No upper extremity supported Sitting balance-Leahy Scale: Fair     Standing balance support: No upper extremity supported;Bilateral upper extremity supported Standing balance-Leahy Scale: Poor Standing balance comment: reliant on external assist                            Cognition Arousal/Alertness: Awake/alert Behavior During Therapy: WFL for tasks assessed/performed Overall Cognitive Status: Difficult to assess                                 General Comments: difficult to assess this date- pt was minimally responsive to commands this date due to dizziness/nausea. he was appropriate in following all commands      Exercises Other Exercises Other Exercises: warm up BIL LE ROM exercise prior to mobility with resistance with gross extension x10 bil    General Comments General comments (skin integrity, edema, etc.): on CRRT      Pertinent Vitals/Pain Pain Assessment: Faces Faces Pain Scale: Hurts a little bit Pain Location: generalized Pain Descriptors / Indicators: Discomfort Pain Intervention(s): Monitored during session    Home Living                      Prior Function  PT Goals (current goals can now be found in the care plan section) Acute Rehab PT Goals Patient Stated Goal: back home independent, family wants some rehab prior to home. PT Goal Formulation: With patient/family Time For Goal Achievement: 06/09/20 Potential to Achieve Goals: Good Progress towards PT goals: Progressing toward goals (although not a great day)    Frequency    Min 3X/week      PT Plan Current plan remains appropriate    Co-evaluation PT/OT/SLP Co-Evaluation/Treatment: Yes Reason for Co-Treatment: Complexity of the patient's impairments (multi-system involvement) PT  goals addressed during session: Mobility/safety with mobility OT goals addressed during session: ADL's and self-care;Strengthening/ROM      AM-PAC PT "6 Clicks" Mobility   Outcome Measure  Help needed turning from your back to your side while in a flat bed without using bedrails?: A Little Help needed moving from lying on your back to sitting on the side of a flat bed without using bedrails?: A Little Help needed moving to and from a bed to a chair (including a wheelchair)?: A Little Help needed standing up from a chair using your arms (e.g., wheelchair or bedside chair)?: A Little Help needed to walk in hospital room?: A Lot Help needed climbing 3-5 steps with a railing? : A Lot 6 Click Score: 16    End of Session     Patient left: in bed;with call bell/phone within reach;with bed alarm set;with family/visitor present Nurse Communication: Mobility status PT Visit Diagnosis: Other abnormalities of gait and mobility (R26.89);Unsteadiness on feet (R26.81)     Time: 5170-0174 PT Time Calculation (min) (ACUTE ONLY): 24 min  Charges:  $Therapeutic Activity: 8-22 mins                     05/29/2020  Ginger Carne., PT Acute Rehabilitation Services [phone]  (pager) [phone]  (office)   Todd Robertson 05/29/2020, 6:07 PM

## 2020-05-29 NOTE — Progress Notes (Signed)
  Speech Language Pathology Treatment: Dysphagia  Patient Details Name: DEQUINCY BORN MRN: [id number] DOB: [birth year] Today's Date: 05/29/2020 Time: 1224-4975 SLP Time Calculation (min) (ACUTE ONLY): 14 min  Assessment / Plan / Recommendation Clinical Impression  Skilled ST intervention today with significant improvements with work of breathing at rest and is on CRRT. He also appears to have increased endurance even though he continues with mild nausea. Accepting of trials thin via cup and straw coordinating his respiration and swallow in synchrony. No suspicions of delays or airway intrusion. From FEES pt safe with thin however endurance and respirations concerning and cautious liquid consistency (nectar) recommended. SLP will upgrade pt to thin, straws allowed, pills with thin, sitting upright and rest breaks if needed. Texture also upgraded to Dys 3 (chopped meats) with continued ST.    HPI HPI: 74 yo male former smoker presented on 7/09 for Rt nephrectomy in setting of renal cell carcinoma.  Post op developed hypotension. cough with water during yale after extubation; 7/13-7/17.       SLP Plan  Continue with current plan of care       Recommendations  Diet recommendations: Dysphagia 3 (mechanical soft);Thin liquid Liquids provided via: Cup;Straw Medication Administration: Whole meds with liquid Supervision: Patient able to self feed;Intermittent supervision to cue for compensatory strategies;Staff to assist with self feeding Compensations: Slow rate;Small sips/bites Postural Changes and/or Swallow Maneuvers: Seated upright 90 degrees                Oral Care Recommendations: Oral care BID Follow up Recommendations: Skilled Nursing facility SLP Visit Diagnosis: Dysphagia, unspecified (R13.10) Plan: Continue with current plan of care             [name] 05/29/2020, 9:29 AM  [name] [name].Ed Risk analyst  [phone] Office [phone]

## 2020-05-29 NOTE — Progress Notes (Signed)
Occupational Therapy Treatment Patient Details Name: Todd Robertson MRN: [id number] DOB: Oct 01, 1946 Today's Date: 05/29/2020    History of present illness 74 year old male with h/o prostate CA, DM2, gout, HTN, presenting 7/9 for planned right Nephrectomy. Went to the OR that day. EBL estimated at 364ml. Intraoperative anesthesia note unremarkable. While in the PACU had declining w/ systolic BPs in 34H. Placed in Trendelenburg; IVF boluses administered. BP normalized but only made 55ml of urine.  Because of this PCCM was asked to assess for circulatory shock and concern about hemodynamics   OT comments  Pt seen for OT f/u session with focus on BADL mobility progression. Pt completed bed mobility at min A level. Once EOB pt endorses increased dizziness in nausea. He was able to progress to standing with min A +2 for line management/safety with significant increased dizziness. Pt tolerated standing ~2 minutes for linen change. He was able to tolerate OOB activity ~10 mins this date. Pt on CRRT, suspect orthostasis being a factor. Dizziness subsided once pt flat. Will continue to progress pt as appropriate. Continue to recommend CIR.    Follow Up Recommendations  CIR    Equipment Recommendations  3 in 1 bedside commode    Recommendations for Other Services      Precautions / Restrictions Precautions Precautions: Fall Precaution Comments: CRRT Restrictions Weight Bearing Restrictions: No       Mobility Bed Mobility Overal bed mobility: Needs Assistance Bed Mobility: Supine to Sit;Sit to Supine     Supine to sit: Min assist Sit to supine: Min assist   General bed mobility comments: cues for direction, truncal assist up and LE's assist back in the bed. Limited by pt dizziness and nausea  Transfers Overall transfer level: Needs assistance Equipment used: 1 person hand held assist Transfers: Sit to/from Stand Sit to Stand: Min assist;+2 safety/equipment         General  transfer comment: min A +2 for safety and line management of CRRT. Increased dizziness with standing    Balance Overall balance assessment: Needs assistance Sitting-balance support: Bilateral upper extremity supported;No upper extremity supported Sitting balance-Leahy Scale: Fair     Standing balance support: No upper extremity supported;Bilateral upper extremity supported Standing balance-Leahy Scale: Poor Standing balance comment: reliant on external assist                           ADL either performed or assessed with clinical judgement   ADL Overall ADL's : Needs assistance/impaired     Grooming: Set up;Sitting                   Toilet Transfer: Moderate assistance;Maximal assistance Toilet Transfer Details (indicate cue type and reason): mod-max A for sit <> stand. Pt limited by dizziness and nausea with positional changes Toileting- Clothing Manipulation and Hygiene: Total assistance Toileting - Clothing Manipulation Details (indicate cue type and reason): has been incontinent       General ADL Comments: pt able to tolerate sitting EOB ~5-7 mins. Was very dizzy and nauseous that exacerbated with sit <> stand. Ultimately requiring to lay back down     Vision Baseline Vision/History: Wears glasses Wears Glasses: At all times Patient Visual Report: No change from baseline     Perception     Praxis      Cognition Arousal/Alertness: Awake/alert Behavior During Therapy: WFL for tasks assessed/performed Overall Cognitive Status: Difficult to assess  General Comments: difficult to assess this date- pt was minimally responsive to commands this date due to dizziness/nausea. he was appropriate in following all commands        Exercises     Shoulder Instructions       General Comments on CRRT    Pertinent Vitals/ Pain       Pain Assessment: Faces Faces Pain Scale: Hurts a little bit Pain Location:  generalized Pain Descriptors / Indicators: Discomfort Pain Intervention(s): Monitored during session  Home Living                                          Prior Functioning/Environment              Frequency  Min 2X/week        Progress Toward Goals  OT Goals(current goals can now be found in the care plan section)  Progress towards OT goals: Progressing toward goals  Acute Rehab OT Goals Patient Stated Goal: back home independent, family wants some rehab prior to home. OT Goal Formulation: With patient/family Time For Goal Achievement: 06/09/20 Potential to Achieve Goals: Good  Plan Discharge plan remains appropriate    Co-evaluation    PT/OT/SLP Co-Evaluation/Treatment: Yes Reason for Co-Treatment: For patient/therapist safety;To address functional/ADL transfers   OT goals addressed during session: ADL's and self-care;Strengthening/ROM      AM-PAC OT "6 Clicks" Daily Activity     Outcome Measure   Help from another person eating meals?: A Little Help from another person taking care of personal grooming?: A Little Help from another person toileting, which includes using toliet, bedpan, or urinal?: A Lot Help from another person bathing (including washing, rinsing, drying)?: A Lot Help from another person to put on and taking off regular upper body clothing?: A Little Help from another person to put on and taking off regular lower body clothing?: A Lot 6 Click Score: 15    End of Session Equipment Utilized During Treatment: Oxygen  OT Visit Diagnosis: Unsteadiness on feet (R26.81);Other abnormalities of gait and mobility (R26.89);Muscle weakness (generalized) (M62.81);Feeding difficulties (R63.3);Pain Pain - part of body:  (generalized)   Activity Tolerance Treatment limited secondary to medical complications (Comment);Other (comment) (dizziness/nausea)   Patient Left in bed;with call bell/phone within reach;with bed alarm set;with  family/visitor present;with nursing/sitter in room   Nurse Communication Mobility status        Time: 8841-6606 OT Time Calculation (min): 17 min  Charges: OT General Charges $OT Visit: 1 Visit OT Treatments $Self Care/Home Management : 8-22 mins  Zenovia Jarred, MSOT, OTR/L [facility] Office Number: [phone] Pager: [phone]  Zenovia Jarred 05/29/2020, 5:33 PM

## 2020-05-29 NOTE — Progress Notes (Signed)
Inpatient Rehabilitation-Admissions Coordinator   Met with the pt, his wife, and his daughter bedside as follow up from prior meeting yesterday.  Continued discussion regarding rehab, including the differences between CIR and SNF, expectations of the recommended program, what an anticipated LOS would be, and functional gains anticipated. Discussed that given the pt's PLOF, motivation, and current support at DC, anticipate he will be a good candidate for CIR once he becomes more medically stable. We addressed that he will need to be off CRRT and deemed ready for rehab before being considered for an IP Rehab admission. Both the pt and his wife want to pursue this program once he becomes more medically stable. AC will continue to follow pt for progress with therapies and medical readiness for possible IP rehab admission.   Please call if questions.   Raechel Ache, OTR/L  Rehab Admissions Coordinator  [phone] 05/29/2020 5:19 PM

## 2020-05-29 NOTE — Progress Notes (Signed)
13 Days Post-Op   Subjective/Chief Complaint:  Remains on iHD, 3.8 L removed yesterday. Stil requiring phenylephrine. Afebrile. Large volume emesis and persistent diarrhea, TF held and then advanced on trickle.   Objective: Vital signs in last 24 hours: Temp:  [97.7 F (36.5 C)-98.8 F (37.1 C)] 97.7 F (36.5 C) (07/22 1538) Pulse Rate:  [43-103] 85 (07/22 1600) Resp:  [15-33] 29 (07/22 1600) BP: (78-133)/(35-92) 99/51 (07/22 1600) SpO2:  [55 %-100 %] 98 % (07/22 1600) Weight:  [118.9 kg] 118.9 kg (07/22 0300) Last BM Date: 05/29/20  Intake/Output from previous day: 07/21 0701 - 07/22 0700 In: 2508.9 [I.V.:234.5; NG/GT:2167.7; IV Piggyback:106.7] Out: 3863  Intake/Output this shift: Total I/O In: 604.4 [I.V.:57.4; NG/GT:438; IV Piggyback:109] Out: 1082 [Other:1082]  General appearance: Sleeping comfortably.  Resp: breathing comfortably on [place] [facility]: HR 70s on monitor, regular.  Extremities: edema improveing Abd: Obese, soft, more distended today, tympantic. Right flank hematoma improving  Lab Results:  Recent Labs    05/28/20 0303 05/29/20 1550  WBC 22.2* 20.5*  HGB 8.4* 9.1*  HCT 27.2* 29.2*  PLT 186 240   BMET Recent Labs    05/28/20 1558 05/29/20 0339  NA 135 136  K 4.0 4.2  CL 101 104  CO2 23 20*  GLUCOSE 143* 191*  BUN 53* 45*  CREATININE 5.14* 4.12*  CALCIUM 8.3* 8.1*   PT/INR No results for input(s): LABPROT, INR in the last 72 hours. ABG No results for input(s): PHART, HCO3 in the last 72 hours.  Invalid input(s): PCO2, PO2  Studies/Results: CT ABDOMEN PELVIS W CONTRAST  Result Date: 05/28/2020 CLINICAL DATA:  Sepsis.  Hematoma following nephrectomy. EXAM: CT ABDOMEN AND PELVIS WITH CONTRAST TECHNIQUE: Multidetector CT imaging of the abdomen and pelvis was performed using the standard protocol following bolus administration of intravenous contrast. CONTRAST:  151mL OMNIPAQUE IOHEXOL 300 MG/ML  SOLN COMPARISON:  CT of the abdomen and pelvis  without contrast 05/22/2020 FINDINGS: Lower chest: Bibasilar airspace disease has improved, right greater than left. Left pleural effusion has resolved. The right pleural effusion has decreased somewhat. The heart size is normal. No significant pericardial effusion is present. Hepatobiliary: No focal liver abnormality is seen. No gallstones, gallbladder wall thickening, or biliary dilatation. Pancreas: Unremarkable. No pancreatic ductal dilatation or surrounding inflammatory changes. Spleen: Normal in size without focal abnormality. Adrenals/Urinary Tract: Adrenal glands are within normal limits bilaterally. Simple cyst is again seen in the left kidney. Left kidney is otherwise unremarkable. No stone or mass lesion is present otherwise. No obstruction is present. The ureter is normal. The urinary bladder is mostly collapsed. Right nephrectomy is noted. Next density hematoma is slightly smaller than on the prior exam. No enhancing abscess is present. Hemorrhage has extended more inferiorly in the retroperitoneal space. No new hemorrhage is present. Stomach/Bowel: Stomach is somewhat distended despite OG tube in place. Small bowel is unremarkable. Ascending colon is normal. Transverse colon is moderately dilated similar the prior exam. More distal transverse colon and descending colon are within normal limits. Vascular/Lymphatic: Atherosclerotic calcifications are present the aorta branch vessels. No aneurysm is present. No significant free air is present. Reproductive: Metallic seeds are again noted. Other: Subcutaneous gas over the right side of the abdomen is improving. No subcutaneous abscess is present. Musculoskeletal: Degenerative anterolisthesis is present at L4-5. Degenerative retrolisthesis present at L5-S1. Vertebral body heights are maintained. Vacuum disc is present at L2-3, L4-5, and L5-S1. Vacuum phenomenon is present in the facet joints on the left at L4-5. Bony pelvis is  normal. The hips are located  and within normal limits. IMPRESSION: 1. Slight decrease in size of right retroperitoneal hematoma following right nephrectomy. 2. No enhancing abscess is present. 3. Improved subcutaneous gas over the right side of the abdomen. 4. Improved bibasilar airspace disease and effusions, right greater than left. 5. Stable moderate dilation of the transverse colon. 6. Stable dilation of the stomach despite OG tube in place 7. Multilevel degenerative changes in the lumbar spine. 8. Aortic Atherosclerosis (ICD10-I70.0). Electronically Signed   By: San Morelle M.D.   On: 05/28/2020 14:04   ECHOCARDIOGRAM COMPLETE  Result Date: 05/28/2020    ECHOCARDIOGRAM REPORT   Patient Name:   CYNTHIA STAINBACK Date of Exam: 05/28/2020 Medical Rec #:  [id number]         Height:       74.0 in Accession #:    [id number]        Weight:       264.8 lb Date of Birth:  1946/07/25          BSA:          2.449 m Patient Age:    74 years          BP:           81/54 mmHg Patient Gender: M                 HR:           82 bpm. Exam Location:  Inpatient Procedure: 2D Echo Indications:    shock  History:        Patient has prior history of Echocardiogram examinations, most                 recent 08/09/2017. Cardiomyopathy; Risk Factors:Diabetes,                 Hypertension, Dyslipidemia and Former Smoker.  Sonographer:    Jannett Celestine RDCS (AE) Referring Phys: [id number] [place]  1. Left ventricular ejection fraction, by estimation, is 45 to 50%. The left ventricle has mildly decreased function. The left ventricle demonstrates global hypokinesis. There is mild left ventricular hypertrophy. Left ventricular diastolic parameters are consistent with Grade II diastolic dysfunction (pseudonormalization).  2. Right ventricular systolic function is normal. The right ventricular size is mildly enlarged. Tricuspid regurgitation signal is inadequate for assessing PA pressure.  3. Right atrial size was mildly dilated.  4. The mitral  valve is normal in structure. No evidence of mitral valve regurgitation. No evidence of mitral stenosis.  5. The aortic valve is tricuspid. Aortic valve regurgitation is not visualized. Mild aortic valve sclerosis is present, with no evidence of aortic valve stenosis.  6. Aortic dilatation noted. 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  7. Technically difficult study with poor images. FINDINGS  Left Ventricle: Left ventricular ejection fraction, by estimation, is 45 to 50%. The left ventricle has mildly decreased function. The left ventricle demonstrates global hypokinesis. The left ventricular internal cavity size was normal in size. There is  mild left ventricular hypertrophy. Left ventricular diastolic parameters are consistent with Grade II diastolic dysfunction (pseudonormalization). Right Ventricle: The right ventricular size is mildly enlarged. No increase in right ventricular wall thickness. Right ventricular systolic function is normal. Tricuspid regurgitation signal is inadequate for assessing PA pressure. Left Atrium: Left atrial size was normal in size. Right Atrium: Right atrial size was mildly dilated. Pericardium: Trivial pericardial effusion is present. Mitral Valve: The mitral  valve is normal in structure. No evidence of mitral valve regurgitation. No evidence of mitral valve stenosis. Tricuspid Valve: The tricuspid valve is normal in structure. Tricuspid valve regurgitation is not demonstrated. Aortic Valve: The aortic valve is tricuspid. Aortic valve regurgitation is not visualized. Mild aortic valve sclerosis is present, with no evidence of aortic valve stenosis. Pulmonic Valve: The pulmonic valve was normal in structure. Pulmonic valve regurgitation is not visualized. Aorta: Aortic dilatation noted. 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 Venous: The inferior vena cava was not well visualized. IAS/Shunts: No atrial level shunt detected by color  flow Doppler.  LEFT VENTRICLE PLAX 2D LVIDd:         3.60 cm  Diastology LVIDs:         2.50 cm  LV e' lateral:   10.90 cm/s LV PW:         1.60 cm  LV E/e' lateral: 7.4 LV IVS:        1.80 cm  LV e' medial:    7.83 cm/s LVOT diam:     2.00 cm  LV E/e' medial:  10.3 LV SV:         49 LV SV Index:   20 LVOT Area:     3.14 cm  RIGHT VENTRICLE RV S prime:     12.80 cm/s TAPSE (M-mode): 1.9 cm LEFT ATRIUM           Index       RIGHT ATRIUM           Index LA diam:      4.10 cm 1.67 cm/m  RA Area:     23.00 cm LA Vol (A2C): 58.3 ml 23.81 ml/m RA Volume:   64.00 ml  26.14 ml/m LA Vol (A4C): 27.1 ml 11.07 ml/m  AORTIC VALVE LVOT Vmax:   110.00 cm/s LVOT Vmean:  89.400 cm/s LVOT VTI:    0.156 m  AORTA Ao Root diam: 3.80 cm MITRAL VALVE MV Area (PHT): 3.60 cm    SHUNTS MV Decel Time: 211 msec    Systemic VTI:  0.16 m MV E velocity: 80.60 cm/s  Systemic Diam: 2.00 cm MV A velocity: 48.80 cm/s MV E/A ratio:  1.65 Loralie Champagne MD Electronically signed by Loralie Champagne MD Signature Date/Time: 05/28/2020/5:30:46 PM    Final     Anti-infectives: Anti-infectives (From admission, onward)   Start     Dose/Rate Route Frequency Ordered Stop   05/27/20 1530  ceFEPIme (MAXIPIME) 2 g in sodium chloride 0.9 % 100 mL IVPB        2 g 200 mL/hr over 30 Minutes Intravenous  Once 05/27/20 1140 05/27/20 1851   05/26/20 2200  ceFEPIme (MAXIPIME) 1 g in sodium chloride 0.9 % 100 mL IVPB  Status:  Discontinued        1 g 200 mL/hr over 30 Minutes Intravenous Every 24 hours 05/25/20 1342 05/27/20 1140   05/22/20 1200  vancomycin (VANCOREADY) IVPB 1250 mg/250 mL  Status:  Discontinued        1,250 mg 166.7 mL/hr over 90 Minutes Intravenous Every 24 hours 05/21/20 1347 05/24/20 0855   05/22/20 0400  vancomycin (VANCOREADY) IVPB 1250 mg/250 mL  Status:  Discontinued        1,250 mg 166.7 mL/hr over 90 Minutes Intravenous Every 24 hours 05/21/20 0218 05/21/20 1201   05/21/20 1201  vancomycin variable dose per unstable renal  function (pharmacist dosing)  Status:  Discontinued  Does not apply See admin instructions 05/21/20 1201 05/22/20 0708   05/21/20 0315  vancomycin (VANCOCIN) 2,500 mg in sodium chloride 0.9 % 500 mL IVPB        2,500 mg 250 mL/hr over 120 Minutes Intravenous  Once 05/21/20 0218 05/21/20 0540   05/21/20 0315  ceFEPIme (MAXIPIME) 2 g in sodium chloride 0.9 % 100 mL IVPB  Status:  Discontinued        2 g 200 mL/hr over 30 Minutes Intravenous Every 12 hours 05/21/20 0218 05/25/20 1342   05/16/20 1700  ceFAZolin (ANCEF) IVPB 1 g/50 mL premix        1 g 100 mL/hr over 30 Minutes Intravenous Every 8 hours 05/16/20 1600 05/17/20 0206   05/16/20 0600  ceFAZolin (ANCEF) 3 g in dextrose 5 % 50 mL IVPB        3 g 100 mL/hr over 30 Minutes Intravenous 30 min pre-op 05/15/20 0725 05/16/20 1944      Assessment/Plan:  1 - RIGHT Renal Neoplasm - s/p nephrectomy. no further cancer directed therapy this admission.    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 Appreciate recommendations of Nephrology regarding long term dialysis plan  3 - Uremic encephalopathy / ICU delirium - continues to improve   4 - Fever / PNA / leukocytosis - resolving, cefepime discontinued. No evidence of intra-abdominal infection on CT scan, and improving PNA.    5- Acute Blood Loss Anemia - No evidence of active post operative bleeding. Hematoma again smaller on CT today.  Agree with PRN transfusion if needed from a cardiovascular/hemodynamic standpoint. Stable from a urologic standpoint to continue DVT ppx.  6 - Constipation - Persistent diarrhea today and large volume emesis. Appreciate RD and CCM management of tube feed and bowel regimen  7 - Hypotension - continues to require pressor support, possibly secondary to volume loss from CRRT. Remains on midodrine   Todd Robertson 05/29/2020

## 2020-05-29 NOTE — Progress Notes (Signed)
[facility] Progress Note  Interval events/Subjective:   Seen on crrt, no acute events, still requiring phenylephrine. for pressor support. No complaints  CRRT rx: BFR 300 PRE 400 DFR 1800CC/HR POST 300 EFF: 2670CC/HR UFR 70CC/HR FF7 M150  Vitals:   05/29/20 1230 05/29/20 1245 05/29/20 1300 05/29/20 1315  BP: 91/66 (!) 80/61 (!) 92/56 98/72  Pulse: 89 83 86 85  Resp: (!) 26 (!) 24 (!) 26 23  Temp:      TempSrc:      SpO2: 98% 97% 98% 96%  Weight:      Height:        Exam: Gen: nad Heent: +ngt CV: rrr, s1s2, no m/r/g Resp: cta bl, slightly diminished bibasilar, no overt w/r/r/c, normal wob Abd: lap wounds, distended Ext: trace edema b/l ue's Neuro: awake, following commands Rt temp hd cath (trialysis)    Home meds:  - asa 68m/ lipitor 40 hs/ benazepril 20 am/ bumex 173mqd/ coreg 25 bid  - allopurinol 300 hs  - symbicort ACT 2 puff bid  - prn's/ vitamins/ supplements  Date                             Creat               egFR   2018                          1.79- 2.38        30- 43   2019                          1.80- 1.81        42-43   January 12, 2020           3.00                    May 13, 2020                2.60                 27   Jul 9                           2.51                 29- 33   Jul 10                         3.42, 3.89    UA none yet   CT abd wo contrast 7/9 - Adrenals/Urinary Tract: Post right nephrectomy. Large volume of hemorrhage in the right nephrectomy bed. Ill-defined air fluid collection tracks in the right retroperitoneum anterior to the iliopsoas muscle and laterally in the abdominal cavity. Hemorrhage tracks into the right aspect of the pelvis in the is only partially included in the field of view. There is moderate subcutaneous emphysema involving the right abdominal wall. Small foci of air dissects between the right abdominal wall musculature. Simple cyst in the left kidney. There is no left hydronephrosis.         Recent Labs  Lab 05/27/20 1515 05/27/20 2038 05/28/20 0303 05/28/20 0303 05/28/20 1558 05/29/20 0339  K   < >  --  4.1   < > 4.0 4.2  BUN   < >  --  64*   < > 53* 45*  CREATININE   < >  --  6.45*   < > 5.14* 4.12*  CALCIUM   < >  --  8.5*   < > 8.3* 8.1*  PHOS   < >  --  2.8   < > 2.5 2.5  HGB  --  7.7* 8.4*  --   --   --    < > = values in this interval not displayed.   Inpatient medications: . arformoterol  15 mcg Nebulization BID  . aspirin  81 mg Oral Daily  . B-complex with vitamin C  1 tablet Oral Daily  . budesonide (PULMICORT) nebulizer solution  0.5 mg Nebulization BID  . chlorhexidine gluconate (MEDLINE KIT)  15 mL Mouth Rinse BID  . Chlorhexidine Gluconate Cloth  6 each Topical Daily  . darbepoetin (ARANESP) injection - NON-DIALYSIS  60 mcg Subcutaneous Q Sat-1800  . feeding supplement (PROSource TF)  90 mL Per Tube BID  . heparin injection (subcutaneous)  5,000 Units Subcutaneous Q8H  . insulin aspart  0-9 Units Subcutaneous Q4H  . insulin detemir  5 Units Subcutaneous QHS  . mouth rinse  15 mL Mouth Rinse q12n4p  . melatonin  3 mg Per Tube QHS  . midodrine  10 mg Oral TID WC  . polyethylene glycol  17 g Per Tube Daily  . sennosides  5 mL Per Tube BID  . sodium chloride flush  10-40 mL Intracatheter Q12H   .  prismasol BGK 4/2.5 400 mL/hr at 05/29/20 0817  . sodium chloride Stopped (05/27/20 0829)  . sodium chloride    . feeding supplement (VITAL 1.5 CAL) Stopped (05/29/20 1218)  . ferric gluconate (FERRLECIT/NULECIT) IV Stopped (05/29/20 1114)  . phenylephrine (NEO-SYNEPHRINE) Adult infusion 30 mcg/min (05/29/20 1300)  . prismasol BGK 2/2.5 replacement solution 300 mL/hr at 05/29/20 0557  . prismasol BGK 4/2.5 1,800 mL/hr at 05/29/20 1108   sodium chloride, acetaminophen **OR** acetaminophen, albuterol, alum & mag hydroxide-simeth, diphenhydrAMINE **OR** diphenhydrAMINE, heparin, menthol-cetylpyridinium, ondansetron, ondansetron (ZOFRAN) IV,  oxyCODONE, promethazine, Resource ThickenUp Clear, sodium chloride flush, tiZANidine   Assessment/ Plan: 1. Dialysis dependent AoCKD IV - b/l creat 2.60, eGFR 27 then s/p nephrectomy with resultant progressive renal insufficiency and oliguria.  HD catheter placed IR 7/13 - not functional so replaced bedside 7/15 by PCCM.  Off CRRT 7/18, restarted 7/20. 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  Will need transition to tunneled cath at some point especially once more stable  Continue with crrt for now, no changes 2. Renal mass - sp R nephrectomy 05/16/20; neg margins on path 3. Anemia ckd / abl - hgb stable today, transfusion/mgmt per primary team. CT 7/9 with hemorrhage in nephrectomy bed - Transfuse per primary. Iron indices show tsat 10%.   Margins were negative, started ESA: aranesp 100 qSat.   Was holding IV iron in setting of septic shock. Started IV Fe load -- TSAT 10% 7/13 4. Shock, presumed septic? Other etiologies being investigated- requiring phenylephrine, continue with midodrine can titrate to 15mg tid. Can consider contrasted studies (benefits outweigh risks given clinically instability and dilated RV on echo).  5. Hyponatremia - resolved 6. Hyperkalemia - resolved, 7. Leukocytosis: persistent, afebrile.  Vikas Singh, MD [facility] 

## 2020-05-29 NOTE — Progress Notes (Signed)
NAME:  Todd Robertson, MRN:  [id number], DOB:  [birth year], LOS: 48 ADMISSION DATE:  05/16/2020, CONSULTATION DATE:  05/16/2020 REFERRING MD:  Dr Alyson Ingles, CHIEF COMPLAINT:  Shock   Brief History   74 yo male former smoker presented on 7/09 for Rt nephrectomy in setting of renal cell carcinoma.  Post op developed hypotension and PCCM consulted.  Past Medical History  OA, Prostate cancer, Cardiomyopathy (? 2018 LVEF 55-60%), CKD, DM 2, Gout, HTN, Asthma  Significant Hospital Events   7/09 Rt nephrectomy 7/13 Rt IJ HD catheter by IR; worsening hypercapnia and AMS >> intubated, start ABx for HCAP 7/14 start CRRT 7/18 off CRRT 7/20 back on CRRT  Consults:  Nephrology  Procedures:  Rt IJ HD catheter 7/13 >> ETT 7/13 >> 7/17 Rt radial a line 7/15 > 7/18  Significant Diagnostic Tests:   CT abd/pelvis 7/09 >> s/p Rt nephrectomy.  Large volume of Rt retroperitoneal hemorrhage, moderate subcutaneous emphysema Rt abdominal wall  CT abd/pelvis 7/15 >> decreasing postoperative hemorrhage and subcutaneous emphysema, patchy consolidation b/l  Echo 7/21: LVEF 45-50% with global hypokinesis, mildly dilated RV with normal function. Mildly dilated RA. Mildly dilated ascending aorta.  Micro Data:  MRSA screen 7/09 >> negative Sputum 7/14 >> normal flora Blood 7/14 >>NG Blood 7/20>   Antimicrobials:  Cefepime 7/13 >> Vancomycin 7/13 >> 7/17  Interim history/subjective:  No appetite, not eating much. Had another episode of vomiting today. Denies other complaints.  Objective   Blood pressure (!) 93/60, pulse 82, temperature 98.5 F (36.9 C), temperature source Oral, resp. rate 23, height 6\' 2"  (1.88 m), weight 118.9 kg, SpO2 100 %.        Intake/Output Summary (Last 24 hours) at 05/29/2020 1218 Last data filed at 05/29/2020 1200 Gross per 24 hour  Intake 1694.35 ml  Output 3770 ml  Net -2075.65 ml   Filed Weights   05/27/20 1500 05/28/20 0300 05/29/20 0300  Weight: 122.5 kg 120.1  kg 118.9 kg    Examination:  General: chronically ill appearing man laying in bed in NAD HEENT: Walker/AT, eyes anicteric, NGT in place. Cardiac - RRR, no murmurs Resp: breathing comfortably on [place], CTAB Abdomen - soft, NT Extremities -no clubbing or cyanosis, no significant LE edema. Skin - warm, dry Neuro: Awake and alert, answering questions appropriately, globally weak   Resolved Hospital Problem list   Septic shock, Acute metabolic encephalopathy 2nd to hypercapnia and renal failure  Assessment & Plan:   Shock requiring reinitiation of CRRT- undifferentiated shock.  No evidence of HFrEF, preserved RV function although dilated RV.  No obvious source of sepsis found. -Repeat blood cultures pending -Increase midodrine to 15 mg 3 times daily -Continue Neosynephrine to maintain MAP greater than 65 -checking cortisol level- WNL even in the afternoon  Acute hypoxic/hypercapnic respiratory failure HAP- resolving Asthma with hx of tobacco abuse. -Mobility to promote good pulmonary hygiene -Titrate down FiO2 as able to maintain SPO2 greater than 90%.  Persistent leukocytosis since 7/10. No other clear s/s of infection other than improving HAP. Negative blood and respiratory cultures from 7/14. -Blood cultures pending  AKI on CKD 3 from ATN in setting of septic shock. Hypotension barring iHD. Discussed with nephrology this morning who would like to resume CRRT. -Continue CRRT per nephrology -Strict I's/O -Renally dose medications  Recurrent nausea and vomiting, concern for possible ileus -Holding tube feeds temporarily. -Agree with discontinuing Nepro tube feeds for regular tube feeds.  S/p Rt nephrectomy for renal cell carcinoma. -Appreciate urology's  assistance with postop care.  Anemia from acute blood loss after surgery and chronic disease.   -Daily CBC unless signs of bleeding-stable this afternoon -Transfuse for hemoglobin less than 7 or hemodynamically significant  bleeding -We will defer to nephrology decision for iron infusion and EPO  DM type 2, hyperglycemia controlled  -Continue Levemir 5 units daily -Continue sliding scale insulin as needed -Goal BG 140-180 while admitted to the ICU  Chronic HFpEF (EF not significantly changed this admission), HLD.  -Continue telemetry  Hx of gout -Holding PTA allopurinol -Dialysis  Constipation, resolved -Hold today  Deconditioning -Continue PT and OT   Delirium with prominent negative symptoms -OOB mobility; appreciate PT's assistance -day-night reorientation -Continue nightly melatonin -Delirium precautions    Best practice:  Diet: tube feeds, modified diet DVT prophylaxis: subcut heparin GI prophylaxis: n/a Mobility: up with assist Code Status: full Disposition: ICU Family: daughter & wife updated at bedside   Labs    CMP Latest Ref Rng & Units 05/29/2020 05/28/2020 05/28/2020  Glucose 70 - 99 mg/dL 191(H) 143(H) 170(H)  BUN 8 - 23 mg/dL 45(H) 53(H) 64(H)  Creatinine 0.61 - 1.24 mg/dL 4.12(H) 5.14(H) 6.45(H)  Sodium 135 - 145 mmol/L 136 135 140  Potassium 3.5 - 5.1 mmol/L 4.2 4.0 4.1  Chloride 98 - 111 mmol/L 104 101 106  CO2 22 - 32 mmol/L 20(L) 23 21(L)  Calcium 8.9 - 10.3 mg/dL 8.1(L) 8.3(L) 8.5(L)  Total Protein 6.5 - 8.1 g/dL - - -  Total Bilirubin 0.3 - 1.2 mg/dL - - -  Alkaline Phos 38 - 126 U/L - - -  AST 15 - 41 U/L - - -  ALT 0 - 44 U/L - - -    CBC Latest Ref Rng & Units 05/28/2020 05/27/2020 05/27/2020  WBC 4.0 - 10.5 K/uL 22.2(H) 21.5(H) 18.9(H)  Hemoglobin 13.0 - 17.0 g/dL 8.4(L) 7.7(L) 7.1(L)  Hematocrit 39 - 52 % 27.2(L) 24.9(L) 23.3(L)  Platelets 150 - 400 K/uL 186 192 173    ABG    Component Value Date/Time   PHART 7.409 05/23/2020 0400   PCO2ART 38.5 05/23/2020 0400   PO2ART 85 05/23/2020 0400   HCO3 24.4 05/23/2020 0400   TCO2 26 05/23/2020 0400   ACIDBASEDEF 1.0 05/20/2020 2308   O2SAT 97.0 05/23/2020 0400    CBG (last 3)  Recent Labs     05/29/20 0322 05/29/20 0716 05/29/20 1113  GLUCAP 159* 158* 178*       This patient is critically ill with multiple organ system failure which requires frequent high complexity decision making, assessment, support, evaluation, and titration of therapies. This was completed through the application of advanced monitoring technologies and extensive interpretation of multiple databases. During this encounter critical care time was devoted to patient care services described in this note for 37 minutes.  Julian Hy, DO 05/29/20 5:45 PM [facility]

## 2020-05-29 NOTE — Progress Notes (Signed)
Lower extremity venous bilateral study completed.   See Cv Proc for preliminary results.   Alyssamae Klinck  

## 2020-05-30 ENCOUNTER — Inpatient Hospital Stay (HOSPITAL_COMMUNITY): Payer: Medicare Other

## 2020-05-30 DIAGNOSIS — R112 Nausea with vomiting, unspecified: Secondary | ICD-10-CM

## 2020-05-30 LAB — RENAL FUNCTION PANEL
Albumin: 2.2 g/dL — ABNORMAL LOW (ref 3.5–5.0)
Albumin: 2.4 g/dL — ABNORMAL LOW (ref 3.5–5.0)
Anion gap: 8 (ref 5–15)
Anion gap: 12 (ref 5–15)
BUN: 34 mg/dL — ABNORMAL HIGH (ref 8–23)
BUN: 30 mg/dL — ABNORMAL HIGH (ref 8–23)
CO2: 26 mmol/L (ref 22–32)
CO2: 27 mmol/L (ref 22–32)
Calcium: 8.3 mg/dL — ABNORMAL LOW (ref 8.9–10.3)
Calcium: 8.5 mg/dL — ABNORMAL LOW (ref 8.9–10.3)
Chloride: 102 mmol/L (ref 98–111)
Chloride: 101 mmol/L (ref 98–111)
Creatinine, Ser: 3.48 mg/dL — ABNORMAL HIGH (ref 0.61–1.24)
Creatinine, Ser: 3.42 mg/dL — ABNORMAL HIGH (ref 0.61–1.24)
GFR calc Af Amer: 19 mL/min — ABNORMAL LOW (ref >60)
GFR calc Af Amer: 20 mL/min — ABNORMAL LOW (ref >60)
GFR calc non Af Amer: 17 mL/min — ABNORMAL LOW (ref >60)
GFR calc non Af Amer: 17 mL/min — ABNORMAL LOW (ref >60)
Glucose, Bld: 183 mg/dL — ABNORMAL HIGH (ref 70–99)
Glucose, Bld: 168 mg/dL — ABNORMAL HIGH (ref 70–99)
Phosphorus: 2.6 mg/dL (ref 2.5–4.6)
Phosphorus: 2.6 mg/dL (ref 2.5–4.6)
Potassium: 4.6 mmol/L (ref 3.5–5.1)
Potassium: 4.5 mmol/L (ref 3.5–5.1)
Sodium: 136 mmol/L (ref 135–145)
Sodium: 140 mmol/L (ref 135–145)

## 2020-05-30 LAB — GLUCOSE, CAPILLARY
Glucose-Capillary: 162 mg/dL — ABNORMAL HIGH (ref 70–99)
Glucose-Capillary: 148 mg/dL — ABNORMAL HIGH (ref 70–99)
Glucose-Capillary: 152 mg/dL — ABNORMAL HIGH (ref 70–99)
Glucose-Capillary: 129 mg/dL — ABNORMAL HIGH (ref 70–99)
Glucose-Capillary: 177 mg/dL — ABNORMAL HIGH (ref 70–99)
Glucose-Capillary: 158 mg/dL — ABNORMAL HIGH (ref 70–99)

## 2020-05-30 LAB — CBC
HCT: 29.7 % — ABNORMAL LOW (ref 39.0–52.0)
Hemoglobin: 9.1 g/dL — ABNORMAL LOW (ref 13.0–17.0)
MCH: 29.7 pg (ref 26.0–34.0)
MCHC: 30.6 g/dL (ref 30.0–36.0)
MCV: 97.1 fL (ref 80.0–100.0)
Platelets: 264 10*3/uL (ref 150–400)
RBC: 3.06 MIL/uL — ABNORMAL LOW (ref 4.22–5.81)
RDW: 18.5 % — ABNORMAL HIGH (ref 11.5–15.5)
WBC: 20.3 10*3/uL — ABNORMAL HIGH (ref 4.0–10.5)
nRBC: 0 % (ref 0.0–0.2)

## 2020-05-30 LAB — MAGNESIUM: Magnesium: 2.7 mg/dL — ABNORMAL HIGH (ref 1.7–2.4)

## 2020-05-30 MED ORDER — ONDANSETRON HCL 4 MG/2ML IJ SOLN
4.0000 mg | Freq: Four times a day (QID) | INTRAMUSCULAR | Status: DC
  Administered 2020-05-30 – 2020-05-31 (×5): 4 mg via INTRAVENOUS
  Filled 2020-05-30 (×5): qty 2

## 2020-05-30 MED ORDER — INSULIN DETEMIR 100 UNIT/ML ~~LOC~~ SOLN
7.0000 [IU] | Freq: Every day | SUBCUTANEOUS | Status: DC
  Administered 2020-05-30 – 2020-06-02 (×4): 7 [IU] via SUBCUTANEOUS
  Filled 2020-05-30 (×5): qty 0.07

## 2020-05-30 MED ORDER — ALBUMIN HUMAN 25 % IV SOLN
25.0000 g | Freq: Once | INTRAVENOUS | Status: AC
  Administered 2020-05-30: 25 g via INTRAVENOUS
  Filled 2020-05-30: qty 100

## 2020-05-30 MED ORDER — MELATONIN 5 MG PO TABS
5.0000 mg | ORAL_TABLET | Freq: Every day | ORAL | Status: DC
  Administered 2020-05-30 – 2020-06-01 (×3): 5 mg
  Filled 2020-05-30 (×3): qty 1

## 2020-05-30 MED ORDER — VASOPRESSIN 20 UNITS/100 ML INFUSION FOR SHOCK
0.0000 [IU]/min | INTRAVENOUS | Status: DC
  Filled 2020-05-30: qty 100

## 2020-05-30 MED ORDER — PHENOL 1.4 % MT LIQD
1.0000 | OROMUCOSAL | Status: DC | PRN
  Administered 2020-05-30 – 2020-06-03 (×3): 1 via OROMUCOSAL
  Filled 2020-05-30: qty 177

## 2020-05-30 NOTE — Progress Notes (Signed)
14 Days Post-Op   Subjective/Chief Complaint:  Remains on iHD, 2.7 L removed yesterday. Low grade fever. Feeds slowed due to emesis    Objective: Vital signs in last 24 hours: Temp:  [97.7 F (36.5 C)-99.6 F (37.6 C)] 99 F (37.2 C) (07/23 1155) Pulse Rate:  [43-106] 87 (07/23 1415) Resp:  [12-36] 22 (07/23 1415) BP: (71-140)/(30-78) 111/54 (07/23 1415) SpO2:  [93 %-100 %] 95 % (07/23 1415) FiO2 (%):  [21 %] 21 % (07/22 2058) Weight:  [117.8 kg] 117.8 kg (07/23 0300) Last BM Date: 05/30/20  Intake/Output from previous day: 07/22 0701 - 07/23 0700 In: 2029.6 [P.O.:820; I.V.:275.1; NG/GT:733; IV Piggyback:201.5] Out: 2627  Intake/Output this shift: Total I/O In: 281.2 [I.V.:150; NG/GT:34.7; IV Piggyback:96.5] Out: 2094 [Emesis/NG output:1800; Other:294]  General appearance: Sleeping comfortably.  Resp: breathing comfortably on [place] Cardio: HR 70s on monitor, regular.  Extremities: edema improveing Abd: Obese, soft, more distended today, tympantic. Right flank hematoma improving  Lab Results:  Recent Labs    05/29/20 1550 05/30/20 0303  WBC 20.5* 20.3*  HGB 9.1* 9.1*  HCT 29.2* 29.7*  PLT 240 264   BMET Recent Labs    05/29/20 1550 05/30/20 0236  NA 138 136  K 4.4 4.6  CL 103 102  CO2 27 26  GLUCOSE 180* 183*  BUN 38* 34*  CREATININE 3.67* 3.48*  CALCIUM 8.4* 8.3*   PT/INR No results for input(s): LABPROT, INR in the last 72 hours. ABG No results for input(s): PHART, HCO3 in the last 72 hours.  Invalid input(s): PCO2, PO2  Studies/Results: DG Abd 1 View  Result Date: 05/30/2020 CLINICAL DATA:  Check feeding catheter placement EXAM: ABDOMEN - 1 VIEW COMPARISON:  05/23/2020 FINDINGS: Nasogastric catheter is been removed and a new weighted feeding catheter is been placed in the mid stomach. IMPRESSION: Feeding catheter within the stomach. Electronically Signed   By: Inez Catalina M.D.   On: 05/30/2020 08:38   DG Abd Portable 1V  Result Date:  05/30/2020 CLINICAL DATA:  Feeding tube placement. EXAM: PORTABLE ABDOMEN - 1 VIEW COMPARISON:  05/30/2020 FINDINGS: A small bore feeding tube is noted with tip overlying the distal duodenum. An NG tube is present with tip overlying the mid stomach. IMPRESSION: Small bore feeding tube with tip overlying the distal duodenum and NG tube with tip overlying the mid stomach. Electronically Signed   By: Margarette Canada M.D.   On: 05/30/2020 10:49   VAS [place] LOWER EXTREMITY VENOUS (DVT)  Result Date: 05/29/2020  Lower Venous DVTStudy Indications: Edema. Other Indications: Shock, dilated RT ventricle on Echo. Limitations: Poor ultrasound/tissue interface. Comparison Study: No prior studies. Performing Technologist: Darlin Coco  Examination Guidelines: A complete evaluation includes B-mode imaging, spectral Doppler, color Doppler, and power Doppler as needed of all accessible portions of each vessel. Bilateral testing is considered an integral part of a complete examination. Limited examinations for reoccurring indications may be performed as noted. The reflux portion of the exam is performed with the patient in reverse Trendelenburg.  +---------+---------------+---------+-----------+----------+--------------+ RIGHT    CompressibilityPhasicitySpontaneityPropertiesThrombus Aging +---------+---------------+---------+-----------+----------+--------------+ CFV      Full           Yes      Yes                                 +---------+---------------+---------+-----------+----------+--------------+ SFJ      Full                                                        +---------+---------------+---------+-----------+----------+--------------+  FV Prox  Full                                                        +---------+---------------+---------+-----------+----------+--------------+ FV Mid   Full                                                         +---------+---------------+---------+-----------+----------+--------------+ FV DistalFull                                                        +---------+---------------+---------+-----------+----------+--------------+ PFV      Full                                                        +---------+---------------+---------+-----------+----------+--------------+ POP      Full           Yes      Yes                                 +---------+---------------+---------+-----------+----------+--------------+ PTV      Full                                                        +---------+---------------+---------+-----------+----------+--------------+ PERO     Full                                                        +---------+---------------+---------+-----------+----------+--------------+   +---------+---------------+---------+-----------+----------+--------------+ LEFT     CompressibilityPhasicitySpontaneityPropertiesThrombus Aging +---------+---------------+---------+-----------+----------+--------------+ CFV      Full           Yes      Yes                                 +---------+---------------+---------+-----------+----------+--------------+ SFJ      Full                                                        +---------+---------------+---------+-----------+----------+--------------+ FV Prox  Full                                                        +---------+---------------+---------+-----------+----------+--------------+  FV Mid   Full                                                        +---------+---------------+---------+-----------+----------+--------------+ FV DistalFull                                                        +---------+---------------+---------+-----------+----------+--------------+ PFV      Full                                                         +---------+---------------+---------+-----------+----------+--------------+ POP      Full           Yes      Yes                                 +---------+---------------+---------+-----------+----------+--------------+ PTV      Full           Yes      Yes                                 +---------+---------------+---------+-----------+----------+--------------+ PERO     Full           Yes      Yes                                 +---------+---------------+---------+-----------+----------+--------------+     Summary: RIGHT: - There is no evidence of deep vein thrombosis in the lower extremity.  - No cystic structure found in the popliteal fossa.  LEFT: - There is no evidence of deep vein thrombosis in the lower extremity.  - No cystic structure found in the popliteal fossa.  *See table(s) above for measurements and observations. Electronically signed by Deitra Mayo MD on 05/29/2020 at 11:13:16 PM.    Final     Anti-infectives: Anti-infectives (From admission, onward)   Start     Dose/Rate Route Frequency Ordered Stop   05/27/20 1530  ceFEPIme (MAXIPIME) 2 g in sodium chloride 0.9 % 100 mL IVPB        2 g 200 mL/hr over 30 Minutes Intravenous  Once 05/27/20 1140 05/27/20 1851   05/26/20 2200  ceFEPIme (MAXIPIME) 1 g in sodium chloride 0.9 % 100 mL IVPB  Status:  Discontinued        1 g 200 mL/hr over 30 Minutes Intravenous Every 24 hours 05/25/20 1342 05/27/20 1140   05/22/20 1200  vancomycin (VANCOREADY) IVPB 1250 mg/250 mL  Status:  Discontinued        1,250 mg 166.7 mL/hr over 90 Minutes Intravenous Every 24 hours 05/21/20 1347 05/24/20 0855   05/22/20 0400  vancomycin (VANCOREADY) IVPB 1250 mg/250 mL  Status:  Discontinued        1,250 mg 166.7 mL/hr over 90 Minutes Intravenous Every  24 hours 05/21/20 0218 05/21/20 1201   05/21/20 1201  vancomycin variable dose per unstable renal function (pharmacist dosing)  Status:  Discontinued         Does not apply See admin  instructions 05/21/20 1201 05/22/20 0708   05/21/20 0315  vancomycin (VANCOCIN) 2,500 mg in sodium chloride 0.9 % 500 mL IVPB        2,500 mg 250 mL/hr over 120 Minutes Intravenous  Once 05/21/20 0218 05/21/20 0540   05/21/20 0315  ceFEPIme (MAXIPIME) 2 g in sodium chloride 0.9 % 100 mL IVPB  Status:  Discontinued        2 g 200 mL/hr over 30 Minutes Intravenous Every 12 hours 05/21/20 0218 05/25/20 1342   05/16/20 1700  ceFAZolin (ANCEF) IVPB 1 g/50 mL premix        1 g 100 mL/hr over 30 Minutes Intravenous Every 8 hours 05/16/20 1600 05/17/20 0206   05/16/20 0600  ceFAZolin (ANCEF) 3 g in dextrose 5 % 50 mL IVPB        3 g 100 mL/hr over 30 Minutes Intravenous 30 min pre-op 05/15/20 0725 05/16/20 1944      Assessment/Plan:  1 - RIGHT Renal Neoplasm - s/p nephrectomy. no further cancer directed therapy this admission.    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 Appreciate recommendations of Nephrology regarding long term dialysis plan  3 - Uremic encephalopathy / ICU delirium - continues to improve   4 - Fever / PNA / leukocytosis - resolving, cefepime discontinued. No evidence of intra-abdominal infection on CT scan, and improving PNA.    5- Acute Blood Loss Anemia - No evidence of active post operative bleeding.   Agree with PRN transfusion if needed from a cardiovascular/hemodynamic standpoint. Stable from a urologic standpoint to continue DVT ppx.  6 - Constipation - .2 BM today Appreciate RD and CCM management of tube feed and bowel regimen  7 - Hypotension - continues to require pressor support, possibly secondary to volume loss from CRRT. Remains on midodrine   Todd Robertson 05/30/2020

## 2020-05-30 NOTE — Progress Notes (Signed)
SLP Cancellation Note  Patient Details Name: Todd Robertson MRN: [id number] DOB: 16-Apr-1946   Cancelled treatment:        Pt vomiting this morning per RN. Per chart pt presumed to have ileus and has/had NGT for decompression, Cortrak placed for slow feedings and he still has diet ordered. Is he being given PO?   [name] 05/30/2020, 12:47 PM  [name] [name].Ed Risk analyst [phone] Office [phone]

## 2020-05-30 NOTE — TOC Initial Note (Signed)
Transition of Care [facility]) - Initial/Assessment Note    Patient Details  Name: Todd Robertson MRN: [id number] Date of Birth: 1946-01-12  Transition of [facility] CM/SW Contact:    Verdell Carmine, RN Phone Number: 05/30/2020, 2:23 PM  Clinical Narrative:                   Expected Discharge Plan: Skilled Nursing Facility Barriers to Discharge: Continued Medical Work up   Patient Goals and CMS Choice        Expected Discharge Plan and Services Expected Discharge Plan: [place]                                              Prior Living Arrangements/Services     Patient language and need for interpreter reviewed:: Yes          Care giver support system in place?: No (comment)   Criminal Activity/Legal Involvement Pertinent to Current Situation/Hospitalization: No - Comment as needed  Activities of Daily Living Home Assistive Devices/Equipment: Eyeglasses ADL Screening (condition at time of admission) Patient's cognitive ability adequate to safely complete daily activities?: Yes Is the patient deaf or have difficulty hearing?: No Does the patient have difficulty seeing, even when wearing glasses/contacts?: No Does the patient have difficulty concentrating, remembering, or making decisions?: No Patient able to express need for assistance with ADLs?: Yes Does the patient have difficulty dressing or bathing?: No Independently performs ADLs?: Yes (appropriate for developmental age) Does the patient have difficulty walking or climbing stairs?: No Weakness of Legs: None Weakness of Arms/Hands: None  Permission Sought/Granted Permission sought to share information with : Case Manager                Emotional Assessment Appearance:: Appears stated age     Orientation: : Oriented to Place, Oriented to  Time, Oriented to Self, Oriented to Situation Alcohol / Substance Use: Not Applicable Psych Involvement: No (comment)  Admission  diagnosis:  Cancer of kidney ([place]) [C64.9] S/p nephrectomy [Z90.5] Patient Active Problem List   Diagnosis Date Noted  . Cancer of kidney ([place]) 05/16/2020  . S/p nephrectomy 05/16/2020  . Renal cell carcinoma of right kidney ([place]) 03/19/2020  . Renal mass 02/13/2020  . Osteoarthritis of right knee 11/17/2017  . CKD (chronic kidney disease) stage 3, GFR 30-59 ml/min (HCC) 08/08/2017  . Degenerative arthritis of left knee 08/08/2017  . NSVT (nonsustained ventricular tachycardia) ([place])   . DM 12/26/2009  . OVERWEIGHT/OBESITY 08/08/2009  . HTN (hypertension) 08/08/2009  . CARDIOMYOPATHY, SECONDARY 08/08/2009   PCP:  Monico Blitz, MD Pharmacy:   [facility] [address] [place] [address] Phone: [phone] Fax: [phone]     Social Determinants of Health (SDOH) Interventions    Readmission Risk Interventions No flowsheet data found.

## 2020-05-30 NOTE — Progress Notes (Signed)
Nutrition Follow-up  DOCUMENTATION CODES:   Obesity unspecified  INTERVENTION:   Recommend restarting trickle tube feeding via post-pyloric Cortrak: - Vital 1.5 @ 20 ml/hr (480 ml/day) - ProSource TF 90 ml BID  Trickle tube feeding regimen provides 880 kcal, 76 grams of protein, and 367 ml of H2O (meets 37% of kcal and 56% of protein needs).   Will monitor for ability to advance tube feeds to goal regimen: - Vital 1.5 @ 60 ml/hr (1440 ml/day) - ProSource Tube Feeding 90 ml BID  Goal tube feeding regimenprovides 2320kcal, 141grams of protein, and 1164ml of H2O.  - Continue B-complex with vitamin C  NUTRITION DIAGNOSIS:   Increased nutrient needs related to acute illness (Renal failure on CRRT) as evidenced by estimated needs.  Ongoing  GOAL:   Patient will meet greater than or equal to 90% of their needs  Unmet at this time  MONITOR:   PO intake, Supplement acceptance, Diet advancement, Labs, Weight trends, TF tolerance, Skin, I & O's  REASON FOR ASSESSMENT:   Consult Enteral/tube feeding initiation and management  ASSESSMENT:   74 yo male admitted 7/9 S/P right nephrectomy for large mass. Transferred to the ICU and required intubation on 7/13 due to shock and worsening respiratory failure. PMH includes HTN, cardiomyopathy, DM2, prostate CA, CKD, HLD, gout.  7/14 - CRRT initiated, TF initiated 7/17 - extubated 7/18 - CRRT d/c 7/19 - Cortrak placed, tip gastric per Cortrak team 7/20 - CRRT restarted, s/p FEES and diet advanced to dysphagia 2 with nectar-thick liquids 7/22 - diet advanced to dysphagia 3 with thin liquids 7/23 - NG tube placed for decompression, Cortrak advanced post-pyloric  Pt with large volume projectile emesis yesterday. Tube feeds were held temporarily then restarted at 20 ml/hr. NG tube placed for gastric decompression and Cortrak advanced post-pyloric by Cortrak team (in distal duodenum per x-ray).  Weight down a total of 14.2 kg  since admit. EDW: 117.8 kg (lowest and current weight).  Pt on pressor support with phenylephrine @ 18.75 ml/hr.  Medications reviewed and include: B-complex with vitamin C, aranesp, SSI q 4 hours, Levemir 7 units daily, IV Zofran 4 mg q 6 hours, miralax, senokot, ferric gluconate  Labs reviewed: magnesium 2.7, hemoglobin 9.1 CBG's: 142-181 x 24 hours  CRRT net UF: 2627 ml x 24 hours I/O's: +2.4 L since admit  Diet Order:   Diet Order            DIET DYS 3 Room service appropriate? Yes with Assist; Fluid consistency: Thin  Diet effective now                 EDUCATION NEEDS:   Not appropriate for education at this time  Skin:  Skin Assessment: Skin Integrity Issues: Incisions: closed abdomen Other: puncture wound to neck (right IJ)  Last BM:  05/30/20  Height:   Ht Readings from Last 1 Encounters:  05/16/20 6\' 2"  (1.88 m)    Weight:   Wt Readings from Last 1 Encounters:  05/30/20 (!) 117.8 kg    Ideal Body Weight:  86.4 kg  BMI:  Body mass index is 33.34 kg/m.  Estimated Nutritional Needs:   Kcal:  8144-8185  Protein:  135-150 grams  Fluid:  1 L + UOP    Gaynell Face, MS, RD, LDN Inpatient Clinical Dietitian Please see AMiON for contact information.

## 2020-05-30 NOTE — Progress Notes (Signed)
Cortrak Tube Team Note:  Consult received to place a Cortrak feeding tube.   X-ray is required, abdominal x-ray has been ordered by the Cortrak team. Please confirm tube placement before using the Cortrak tube.   If the tube becomes dislodged please keep the tube and contact the Cortrak team at [email or web address] (password [email or web address]) for replacement.  If after hours and replacement cannot be delayed, place a NG tube and confirm placement with an abdominal x-ray.    Koleen Distance MS, RD, LDN Please refer to [facility] for RD and/or RD on-call/weekend/after hours pager

## 2020-05-30 NOTE — Progress Notes (Signed)
eLink Physician-Brief Progress Note Patient Name: MAHAMUD METTS DOB: 1946-09-08 MRN: [id number]   Date of Service  05/30/2020  HPI/Events of Note  Hypotension on CRRT despite 150 mcg infusion of  Phenylephrine.  eICU Interventions  25 gm of 25 % Albumin given as a bolus, and Vasopressin added to the patients PRN medications for BP support.        Kerry Kass [employer] 05/30/2020, 3:12 AM

## 2020-05-30 NOTE — Progress Notes (Signed)
NAME:  Todd Robertson, MRN:  [id number], DOB:  Aug 30, 1946, LOS: 67 ADMISSION DATE:  05/16/2020, CONSULTATION DATE:  05/16/2020 REFERRING MD:  Dr Alyson Ingles, CHIEF COMPLAINT:  Shock   Brief History   74 yo male former smoker presented on 7/09 for Rt nephrectomy in setting of renal cell carcinoma.  Post op developed hypotension and PCCM consulted.  Past Medical History  OA, Prostate cancer, Cardiomyopathy (? 2018 LVEF 55-60%), CKD, DM 2, Gout, HTN, Asthma  Significant Hospital Events   7/09 Rt nephrectomy 7/13 Rt IJ HD catheter by IR; worsening hypercapnia and AMS >> intubated, start ABx for HCAP 7/14 start CRRT 7/18 off CRRT 7/20 back on CRRT  Consults:  Nephrology PCCM Urology  Procedures:  Rt IJ HD catheter 7/13 >> ETT 7/13 >> 7/17 Rt radial a line 7/15 > 7/18  Significant Diagnostic Tests:   CT abd/pelvis 7/09 >> s/p Rt nephrectomy.  Large volume of Rt retroperitoneal hemorrhage, moderate subcutaneous emphysema Rt abdominal wall  CT abd/pelvis 7/15 >> decreasing postoperative hemorrhage and subcutaneous emphysema, patchy consolidation b/l  Echo 7/21: LVEF 45-50% with global hypokinesis, mildly dilated RV with normal function. Mildly dilated RA. Mildly dilated ascending aorta.  LE [place] > no DVTs  Micro Data:  MRSA screen 7/09 >> negative Sputum 7/14 >> normal flora Blood 7/14 >>NG Blood 7/20>   Antimicrobials:  Cefepime 7/13 >> 7/20 Vancomycin 7/13 >> 7/17  Interim history/subjective:  Still having nausea. TF running at lower rate. No abdominal pain, still having BMs. Didn't sleep great last night.  Objective   Blood pressure (!) 88/55, pulse 85, temperature 98.7 F (37.1 C), temperature source Oral, resp. rate 17, height 6\' 2"  (1.88 m), weight (!) 117.8 kg, SpO2 98 %.    FiO2 (%):  [21 %] 21 %   Intake/Output Summary (Last 24 hours) at 05/30/2020 0718 Last data filed at 05/30/2020 0700 Gross per 24 hour  Intake 2029.59 ml  Output 2627 ml  Net -597.41 ml    Filed Weights   05/28/20 0300 05/29/20 0300 05/30/20 0300  Weight: 120.1 kg 118.9 kg (!) 117.8 kg    Examination:  General: Elderly man lying in bed watching TV in no acute distress HEENT: [place]/AT, eyes anicteric, NGT in place Cardiac -irregular rhythm, regular rate.  No murmurs. Resp: Breathing comfortably on room air, no increased work of breathing.  CTA B. Abdomen -soft, nontender, nondistended Extremities -no dependent lower extremity edema.  No clubbing or cyanosis. Skin -warm and dry.  No rashes. Neuro: Awake and alert, answering questions appropriately.  Not moving much during exam.   Resolved Hospital Problem list   Septic shock, Acute metabolic encephalopathy 2nd to hypercapnia and renal failure  Assessment & Plan:   Shock requiring reinitiation of CRRT- undifferentiated shock.  No evidence of HFrEF, preserved RV function although dilated RV.  No obvious source of sepsis found. -Repeat blood cultures pending -Increase midodrine to 15 mg 3 times daily -Continue Neosynephrine to maintain MAP greater than 65 -checking cortisol level- WNL even in the afternoon  Acute hypoxic/hypercapnic respiratory failure-resolved HAP- resolved Asthma with hx of tobacco abuse. -Continue to promote good pulmonary hygiene.  Difficult with CRRT alarms with coughing. -Supplemental oxygen only if SPO2 less than 90%.  Persistent leukocytosis since 7/10. No other clear s/s of infection other than improving HAP. Negative blood and respiratory cultures from 7/14.  Repeat blood culture 7/20 - negative so far. -Continue to follow blood cultures until finalized.  AKI on CKD 3 from ATN in  setting of septic shock requiring RRT. Hypotension barring iHD.  -Continue CRRT per nephrology; increasing midodrine doses to hopefully liberate from CRRT. -Strict I's/O -Renally dose medications  Recurrent nausea and vomiting, concern for possible ileus -Continue tube feeds at low rate. -Agree with  discontinuing Nepro tube feeds for regular tube feeds to promote tolerability -Scheduled Zofran; need to watch QTC -KUB  S/p Rt nephrectomy for renal cell carcinoma. -Appreciate urology's assistance with postop care.  Anemia from acute blood loss after surgery and chronic disease.   -Daily CBC unless signs of bleeding-stable this afternoon -Transfuse for hemoglobin less than 7 or hemodynamically significant bleeding -We will defer to nephrology decision for iron infusion and EPO  DM type 2, hyperglycemia controlled  -Increase Levemir to 7 units daily -Continue sliding scale insulin as needed -Goal BG 140-180 while admitted to the ICU  Chronic HFpEF (EF not significantly changed this admission), HLD.  PACs and possibly a few dropped beats on tele. No Afib. -Continue telemetry  Hx of gout -Holding PTA allopurinol -Dialysis  Deconditioning -Continue PT and OT   Delirium with prominent negative symptoms- resolved -OOB mobility; appreciate PT's assistance -Day-night reorientation -Continue nightly melatonin-increase to 5 mg -Delirium precautions    Best practice:  Diet: tube feeds, modified diet DVT prophylaxis: subcut heparin GI prophylaxis: n/a Mobility: up with assist Code Status: full Disposition: ICU Family: daughter & wife updated at bedside daily  Labs    CMP Latest Ref Rng & Units 05/30/2020 05/29/2020 05/29/2020  Glucose 70 - 99 mg/dL 183(H) 180(H) 191(H)  BUN 8 - 23 mg/dL 34(H) 38(H) 45(H)  Creatinine 0.61 - 1.24 mg/dL 3.48(H) 3.67(H) 4.12(H)  Sodium 135 - 145 mmol/L 136 138 136  Potassium 3.5 - 5.1 mmol/L 4.6 4.4 4.2  Chloride 98 - 111 mmol/L 102 103 104  CO2 22 - 32 mmol/L 26 27 20(L)  Calcium 8.9 - 10.3 mg/dL 8.3(L) 8.4(L) 8.1(L)  Total Protein 6.5 - 8.1 g/dL - - -  Total Bilirubin 0.3 - 1.2 mg/dL - - -  Alkaline Phos 38 - 126 U/L - - -  AST 15 - 41 U/L - - -  ALT 0 - 44 U/L - - -    CBC Latest Ref Rng & Units 05/30/2020 05/29/2020 05/28/2020  WBC 4.0  - 10.5 K/uL 20.3(H) 20.5(H) 22.2(H)  Hemoglobin 13.0 - 17.0 g/dL 9.1(L) 9.1(L) 8.4(L)  Hematocrit 39 - 52 % 29.7(L) 29.2(L) 27.2(L)  Platelets 150 - 400 K/uL 264 240 186    ABG    Component Value Date/Time   PHART 7.409 05/23/2020 0400   PCO2ART 38.5 05/23/2020 0400   PO2ART 85 05/23/2020 0400   HCO3 24.4 05/23/2020 0400   TCO2 26 05/23/2020 0400   ACIDBASEDEF 1.0 05/20/2020 2308   O2SAT 97.0 05/23/2020 0400    CBG (last 3)  Recent Labs    05/29/20 2314 05/30/20 0308 05/30/20 0715  GLUCAP 157* 162* 148*       This patient is critically ill with multiple organ system failure which requires frequent high complexity decision making, assessment, support, evaluation, and titration of therapies. This was completed through the application of advanced monitoring technologies and extensive interpretation of multiple databases. During this encounter critical care time was devoted to patient care services described in this note for 32 minutes.  Julian Hy, DO 05/30/20 7:37 AM [facility]

## 2020-05-30 NOTE — Progress Notes (Signed)
[facility] Progress Note  Interval events/Subjective:   Seen on crrt, no acute events, still requiring phenylephrine, for pressor support. ngt repositioned to post-pyloric (now should be starting TF's and be able to receive midodrine without intermittent suction).  CRRT rx: BFR 300 PRE 400 DFR 1800CC/HR POST 300 EFF: 2670CC/HR FF 6% M150  Vitals:   05/30/20 1530 05/30/20 1545 05/30/20 1600 05/30/20 1615  BP: (!) 94/61 (!) 106/51 (!) 96/62 (!) 97/59  Pulse: 95 93 100 89  Resp: 14 19 17 16   Temp:      TempSrc:      SpO2: 99% 96% 95% 98%  Weight:      Height:        Exam: Gen: nad Heent: +ngt CV: rrr, s1s2, no m/r/g Resp: cta bl, no overt w/r/r/c, normal wob Abd: lap wounds, distended Ext: no edema Neuro: awake, following commands, speech clear and coherent, more lucid today Rt temp hd cath (trialysis)    Home meds:  - asa 45m/ lipitor 40 hs/ benazepril 20 am/ bumex 182mqd/ coreg 25 bid  - allopurinol 300 hs  - symbicort ACT 2 puff bid  - prn's/ vitamins/ supplements  Date                             Creat               egFR   2018                          1.79- 2.38        30- 43   2019                          1.80- 1.81        42-43   January 12, 2020           3.00                    May 13, 2020                2.60                 27   Jul 9                           2.51                 29- 33   Jul 10                         3.42, 3.89    UA none yet   CT abd wo contrast 7/9 - Adrenals/Urinary Tract: Post right nephrectomy. Large volume of hemorrhage in the right nephrectomy bed. Ill-defined air fluid collection tracks in the right retroperitoneum anterior to the iliopsoas muscle and laterally in the abdominal cavity. Hemorrhage tracks into the right aspect of the pelvis in the is only partially included in the field of view. There is moderate subcutaneous emphysema involving the right abdominal wall. Small foci of air dissects between the  right abdominal wall musculature. Simple cyst in the left kidney. There is no left hydronephrosis.  CT abd w/ contrast 05/28/2020 1. Slight decrease in size of right retroperitoneal hematoma following right nephrectomy. 2. No enhancing abscess is present.  3. Improved subcutaneous gas over the right side of the abdomen. 4. Improved bibasilar airspace disease and effusions, right greater than left. 5. Stable moderate dilation of the transverse colon. 6. Stable dilation of the stomach despite OG tube in place 7. Multilevel degenerative changes in the lumbar spine. 8. Aortic Atherosclerosis (ICD10-I70.0).        Recent Labs  Lab 05/29/20 1550 05/30/20 0236 05/30/20 0303  K 4.4 4.6  --   BUN 38* 34*  --   CREATININE 3.67* 3.48*  --   CALCIUM 8.4* 8.3*  --   PHOS 2.3* 2.6  --   HGB 9.1*  --  9.1*   Inpatient medications: . arformoterol  15 mcg Nebulization BID  . aspirin  81 mg Oral Daily  . B-complex with vitamin C  1 tablet Oral Daily  . budesonide (PULMICORT) nebulizer solution  0.5 mg Nebulization BID  . chlorhexidine gluconate (MEDLINE KIT)  15 mL Mouth Rinse BID  . Chlorhexidine Gluconate Cloth  6 each Topical Daily  . darbepoetin (ARANESP) injection - NON-DIALYSIS  60 mcg Subcutaneous Q Sat-1800  . feeding supplement (PROSource TF)  90 mL Per Tube BID  . heparin injection (subcutaneous)  5,000 Units Subcutaneous Q8H  . insulin aspart  0-9 Units Subcutaneous Q4H  . insulin detemir  7 Units Subcutaneous QHS  . mouth rinse  15 mL Mouth Rinse q12n4p  . melatonin  5 mg Per Tube QHS  . midodrine  15 mg Oral TID WC  . ondansetron (ZOFRAN) IV  4 mg Intravenous Q6H  . polyethylene glycol  17 g Per Tube Daily  . sennosides  5 mL Per Tube BID  . sodium chloride flush  10-40 mL Intracatheter Q12H   .  prismasol BGK 4/2.5 400 mL/hr at 05/30/20 1007  . sodium chloride Stopped (05/27/20 0829)  . sodium chloride    . feeding supplement (VITAL 1.5 CAL) 1,000 mL (05/30/20 1556)   . ferric gluconate (FERRLECIT/NULECIT) IV Stopped (05/30/20 1219)  . phenylephrine (NEO-SYNEPHRINE) Adult infusion 125 mcg/min (05/30/20 1600)  . prismasol BGK 2/2.5 replacement solution 300 mL/hr at 05/29/20 2322  . prismasol BGK 4/2.5 1,800 mL/hr at 05/30/20 1506  . vasopressin     sodium chloride, acetaminophen **OR** acetaminophen, albuterol, alum & mag hydroxide-simeth, diphenhydrAMINE **OR** diphenhydrAMINE, heparin, menthol-cetylpyridinium, ondansetron, ondansetron (ZOFRAN) IV, oxyCODONE, promethazine, Resource ThickenUp Clear, sodium chloride flush, tiZANidine   Assessment/ Plan: 1. Dialysis dependent AoCKD IV - b/l creat 2.60, eGFR 27 then s/p nephrectomy with resultant progressive renal insufficiency and oliguria.  HD catheter placed IR 7/13 - not functional so replaced bedside 7/15 by PCCM.  Off CRRT 7/18, restarted 7/20. 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  Will need transition to tunneled cath at some point especially once more stable  Continue with crrt for now, no changes to bath. 2. Renal mass - sp R nephrectomy 05/16/20; neg margins on path 3. Anemia ckd / abl - hgb stable today, transfusion/mgmt per primary team. CT 7/9 with hemorrhage in nephrectomy bed - Transfuse per primary. Iron indices show tsat 10%.   Margins were negative, started ESA: aranesp 100 qSat.   Was holding IV iron in setting of septic shock. Started IV Fe load -- TSAT 10% 7/13 4. Shock, presumed septic? Etiology currently unknown- requiring phenylephrine, continue with midodrine 5. Hyponatremia - resolved 6. Hyperkalemia - resolved, 7. Leukocytosis: persistent, afebrile.  Discussed with daughter and wife at the bedside about long-term plan. At this junction it is unclear if hemodialysis is overall permanent however there  is a high chance that he will be discharged on dialysis at the very least.  Gean Quint, MD [facility]

## 2020-05-30 NOTE — Progress Notes (Signed)
PT Cancellation Note  Patient Details Name: Todd Robertson MRN: [id number] DOB: [birth year]   Cancelled Treatment:    Reason Eval/Treat Not Completed: Patient declined, no reason specified.  Pt state he didn't feel up to it at all the whole day. 05/30/2020  Ginger Carne., PT Acute [facility] Services [phone]  (pager) [phone]  (office)   Tessie Fass Jadzia Ibsen 05/30/2020, 1:30 PM

## 2020-05-31 LAB — PREPARE RBC (CROSSMATCH)

## 2020-05-31 LAB — RENAL FUNCTION PANEL
Albumin: 2.5 g/dL — ABNORMAL LOW (ref 3.5–5.0)
Albumin: 2.7 g/dL — ABNORMAL LOW (ref 3.5–5.0)
Anion gap: 11 (ref 5–15)
Anion gap: 11 (ref 5–15)
BUN: 30 mg/dL — ABNORMAL HIGH (ref 8–23)
BUN: 31 mg/dL — ABNORMAL HIGH (ref 8–23)
CO2: 29 mmol/L (ref 22–32)
CO2: 25 mmol/L (ref 22–32)
Calcium: 8.4 mg/dL — ABNORMAL LOW (ref 8.9–10.3)
Calcium: 8.2 mg/dL — ABNORMAL LOW (ref 8.9–10.3)
Chloride: 101 mmol/L (ref 98–111)
Chloride: 102 mmol/L (ref 98–111)
Creatinine, Ser: 3.33 mg/dL — ABNORMAL HIGH (ref 0.61–1.24)
Creatinine, Ser: 3.22 mg/dL — ABNORMAL HIGH (ref 0.61–1.24)
GFR calc Af Amer: 20 mL/min — ABNORMAL LOW (ref >60)
GFR calc Af Amer: 21 mL/min — ABNORMAL LOW (ref >60)
GFR calc non Af Amer: 17 mL/min — ABNORMAL LOW (ref >60)
GFR calc non Af Amer: 18 mL/min — ABNORMAL LOW (ref >60)
Glucose, Bld: 197 mg/dL — ABNORMAL HIGH (ref 70–99)
Glucose, Bld: 162 mg/dL — ABNORMAL HIGH (ref 70–99)
Phosphorus: 2.7 mg/dL (ref 2.5–4.6)
Phosphorus: 2.6 mg/dL (ref 2.5–4.6)
Potassium: 4.5 mmol/L (ref 3.5–5.1)
Potassium: 5.1 mmol/L (ref 3.5–5.1)
Sodium: 141 mmol/L (ref 135–145)
Sodium: 138 mmol/L (ref 135–145)

## 2020-05-31 LAB — CBC
HCT: 26.7 % — ABNORMAL LOW (ref 39.0–52.0)
Hemoglobin: 8.1 g/dL — ABNORMAL LOW (ref 13.0–17.0)
MCH: 29.5 pg (ref 26.0–34.0)
MCHC: 30.3 g/dL (ref 30.0–36.0)
MCV: 97.1 fL (ref 80.0–100.0)
Platelets: 245 10*3/uL (ref 150–400)
RBC: 2.75 MIL/uL — ABNORMAL LOW (ref 4.22–5.81)
RDW: 18.2 % — ABNORMAL HIGH (ref 11.5–15.5)
WBC: 17.6 10*3/uL — ABNORMAL HIGH (ref 4.0–10.5)
nRBC: 0 % (ref 0.0–0.2)

## 2020-05-31 LAB — MAGNESIUM: Magnesium: 2.6 mg/dL — ABNORMAL HIGH (ref 1.7–2.4)

## 2020-05-31 LAB — OCCULT BLOOD GASTRIC / DUODENUM (SPECIMEN CUP): Occult Blood, Gastric: POSITIVE — AB

## 2020-05-31 LAB — HEMOGLOBIN AND HEMATOCRIT, BLOOD
HCT: 24.9 % — ABNORMAL LOW (ref 39.0–52.0)
Hemoglobin: 7.6 g/dL — ABNORMAL LOW (ref 13.0–17.0)

## 2020-05-31 LAB — GLUCOSE, CAPILLARY
Glucose-Capillary: 144 mg/dL — ABNORMAL HIGH (ref 70–99)
Glucose-Capillary: 156 mg/dL — ABNORMAL HIGH (ref 70–99)
Glucose-Capillary: 165 mg/dL — ABNORMAL HIGH (ref 70–99)
Glucose-Capillary: 168 mg/dL — ABNORMAL HIGH (ref 70–99)
Glucose-Capillary: 169 mg/dL — ABNORMAL HIGH (ref 70–99)
Glucose-Capillary: 179 mg/dL — ABNORMAL HIGH (ref 70–99)

## 2020-05-31 MED ORDER — METOCLOPRAMIDE HCL 5 MG/5ML PO SOLN
5.0000 mg | Freq: Three times a day (TID) | ORAL | Status: DC
  Administered 2020-05-31 – 2020-06-02 (×5): 5 mg
  Filled 2020-05-31 (×2): qty 10
  Filled 2020-05-31: qty 5
  Filled 2020-05-31 (×3): qty 10
  Filled 2020-05-31: qty 5
  Filled 2020-05-31 (×3): qty 10
  Filled 2020-05-31: qty 5

## 2020-05-31 MED ORDER — ALBUMIN HUMAN 5 % IV SOLN
25.0000 g | Freq: Once | INTRAVENOUS | Status: AC
  Administered 2020-05-31: 25 g via INTRAVENOUS
  Filled 2020-05-31: qty 500

## 2020-05-31 MED ORDER — SODIUM CHLORIDE 0.9% IV SOLUTION: Freq: Once | INTRAVENOUS | Status: AC

## 2020-05-31 MED ORDER — PANTOPRAZOLE SODIUM 40 MG IV SOLR
40.0000 mg | Freq: Two times a day (BID) | INTRAVENOUS | Status: DC
  Administered 2020-05-31 – 2020-06-01 (×5): 40 mg via INTRAVENOUS
  Filled 2020-05-31 (×5): qty 40

## 2020-05-31 NOTE — Progress Notes (Signed)
NAME:  Todd Robertson, MRN:  [id number], DOB:  [birth year], LOS: 6 ADMISSION DATE:  05/16/2020, CONSULTATION DATE:  05/16/2020 REFERRING MD:  Dr Alyson Ingles, CHIEF COMPLAINT:  Shock   Brief History   74 yo male former smoker presented on 7/09 for Rt nephrectomy in setting of renal cell carcinoma.  Post op developed hypotension and PCCM consulted.  Past Medical History  OA, Prostate cancer, Cardiomyopathy (? 2018 LVEF 55-60%), CKD, DM 2, Gout, HTN, Asthma  Significant Hospital Events   7/09 Rt nephrectomy 7/13 Rt IJ HD catheter by IR; worsening hypercapnia and AMS >> intubated, start ABx for HCAP 7/14 start CRRT 7/18 off CRRT 7/20 back on CRRT  Consults:  Nephrology PCCM Urology  Procedures:  Rt IJ HD catheter 7/13 >> ETT 7/13 >> 7/17 Rt radial a line 7/15 > 7/18  Significant Diagnostic Tests:   CT abd/pelvis 7/09 >> s/p Rt nephrectomy.  Large volume of Rt retroperitoneal hemorrhage, moderate subcutaneous emphysema Rt abdominal wall  CT abd/pelvis 7/15 >> decreasing postoperative hemorrhage and subcutaneous emphysema, patchy consolidation b/l  Echo 7/21: LVEF 45-50% with global hypokinesis, mildly dilated RV with normal function. Mildly dilated RA. Mildly dilated ascending aorta.  LE [place] > no DVTs  Micro Data:  MRSA screen 7/09 >> negative Sputum 7/14 >> normal flora Blood 7/14 >>NG Blood 7/20>   Antimicrobials:  Cefepime 7/13 >> 7/20 Vancomycin 7/13 >> 7/17  Interim history/subjective:  Still having nausea. TF running at lower rate. No abdominal pain, still having BMs. Didn't sleep great last night.  Objective   Blood pressure (!) 98/57, pulse 102, temperature 98.9 F (37.2 C), temperature source Oral, resp. rate 17, height 6\' 2"  (1.88 m), weight (!) 110.3 kg, SpO2 97 %.    FiO2 (%):  [21 %] 21 %   Intake/Output Summary (Last 24 hours) at 05/31/2020 0717 Last data filed at 05/31/2020 0600 Gross per 24 hour  Intake 909.73 ml  Output 5643 ml  Net -4733.27 ml    Filed Weights   05/29/20 0300 05/30/20 0300 05/31/20 0300  Weight: 118.9 kg (!) 117.8 kg (!) 110.3 kg    Examination:  General: Elderly man lying in bed watching TV in no acute distress HEENT: [place]/AT, eyes anicteric, NGT in place Cardiac -irregular rhythm, regular rate.  No murmurs. Resp: Breathing comfortably on room air, no increased work of breathing.  CTA B. Abdomen -soft, nontender, nondistended Extremities -no dependent lower extremity edema.  No clubbing or cyanosis. Skin -warm and dry.  No rashes. Neuro: Awake and alert, answering questions appropriately.  Not moving much during exam.   Resolved Hospital Problem list   Septic shock, Acute metabolic encephalopathy 2nd to hypercapnia and renal failure  Assessment & Plan:   Shock requiring reinitiation of CRRT- undifferentiated shock.  No evidence of HFrEF, preserved RV function although dilated RV.  No obvious source of sepsis found. -Repeat blood cultures pending -Increased midodrine to 15 mg 3 times daily -Continue Neosynephrine to maintain MAP greater than 65 -checking cortisol level- WNL even in the afternoon  Possible GIB NG tube with bloody output.  Protonix  Recheck H/h   Acute hypoxic/hypercapnic respiratory failure-resolved HAP- resolved Asthma with hx of tobacco abuse. -Continue to promote good pulmonary hygiene.  Difficult with CRRT alarms with coughing. -Supplemental oxygen only if SPO2 less than 90%.  Persistent leukocytosis since 7/10. No other clear s/s of infection other than improving HAP. Negative blood and respiratory cultures from 7/14.  Repeat blood culture 7/20 - negative so far. -  Continue to follow blood cultures until finalized.  AKI on CKD 3 from ATN in setting of septic shock requiring RRT. Hypotension barring iHD.  -Continue CRRT per nephrology; increasing midodrine doses to hopefully liberate from CRRT. -Strict I's/O -Renally dose medications  Recurrent nausea and vomiting, concern  for possible ileus -Continue tube feeds at low rate. -Agree with discontinuing Nepro tube feeds for regular tube feeds to promote tolerability -Scheduled Zofran; need to watch QTC -KUB  S/p Rt nephrectomy for renal cell carcinoma. -Appreciate urology's assistance with postop care.  Anemia from acute blood loss after surgery and chronic disease.   -Daily CBC unless signs of bleeding-stable this afternoon -Transfuse for hemoglobin less than 7 or hemodynamically significant bleeding -We will defer to nephrology decision for iron infusion and EPO  DM type 2, hyperglycemia controlled  -Increase Levemir to 7 units daily -Continue sliding scale insulin as needed -Goal BG 140-180 while admitted to the ICU  Chronic HFpEF (EF not significantly changed this admission), HLD.  PACs and possibly a few dropped beats on tele. No Afib. -Continue telemetry  Hx of gout -Holding PTA allopurinol -Dialysis  Deconditioning -Continue PT and OT   Delirium with prominent negative symptoms- resolved -OOB mobility; appreciate PT's assistance -Day-night reorientation -Continue nightly melatonin-increase to 5 mg -Delirium precautions    Best practice:  Diet: tube feeds, modified diet DVT prophylaxis: subcut heparin GI prophylaxis: n/a Mobility: up with assist Code Status: full Disposition: ICU Family: daughter & wife updated at bedside daily  Labs    CMP Latest Ref Rng & Units 05/31/2020 05/30/2020 05/30/2020  Glucose 70 - 99 mg/dL 197(H) 168(H) 183(H)  BUN 8 - 23 mg/dL 30(H) 30(H) 34(H)  Creatinine 0.61 - 1.24 mg/dL 3.33(H) 3.42(H) 3.48(H)  Sodium 135 - 145 mmol/L 141 140 136  Potassium 3.5 - 5.1 mmol/L 4.5 4.5 4.6  Chloride 98 - 111 mmol/L 101 101 102  CO2 22 - 32 mmol/L 29 27 26   Calcium 8.9 - 10.3 mg/dL 8.4(L) 8.5(L) 8.3(L)  Total Protein 6.5 - 8.1 g/dL - - -  Total Bilirubin 0.3 - 1.2 mg/dL - - -  Alkaline Phos 38 - 126 U/L - - -  AST 15 - 41 U/L - - -  ALT 0 - 44 U/L - - -    CBC  Latest Ref Rng & Units 05/31/2020 05/30/2020 05/29/2020  WBC 4.0 - 10.5 K/uL 17.6(H) 20.3(H) 20.5(H)  Hemoglobin 13.0 - 17.0 g/dL 8.1(L) 9.1(L) 9.1(L)  Hematocrit 39 - 52 % 26.7(L) 29.7(L) 29.2(L)  Platelets 150 - 400 K/uL 245 264 240    ABG    Component Value Date/Time   PHART 7.409 05/23/2020 0400   PCO2ART 38.5 05/23/2020 0400   PO2ART 85 05/23/2020 0400   HCO3 24.4 05/23/2020 0400   TCO2 26 05/23/2020 0400   ACIDBASEDEF 1.0 05/20/2020 2308   O2SAT 97.0 05/23/2020 0400    CBG (last 3)  Recent Labs    05/30/20 2324 05/31/20 0307 05/31/20 0705  GLUCAP 158* 165* 169*       This patient is critically ill with multiple organ system failure which requires frequent high complexity decision making, assessment, support, evaluation, and titration of therapies. This was completed through the application of advanced monitoring technologies and extensive interpretation of multiple databases. During this encounter critical care time was devoted to patient care services described in this note for 32 minutes.  Collier Bullock, DO 05/31/20 7:17 AM [facility]

## 2020-05-31 NOTE — Progress Notes (Signed)
15 Days Post-Op   Subjective/Chief Complaint:  Afebrile past 24 hours.  NGT to wall suction, 4.2L out for past 24 hours Corpak placed post pyloric, started on trickle feeds, tolerating Leukocytosis improving to 17 Blood cultures negative x 3 days Remains on iHD, 1.4L off yesterday  Objective: Vital signs in last 24 hours: Temp:  [98.7 F (37.1 C)-99.6 F (37.6 C)] 98.7 F (37.1 C) (07/24 0337) Pulse Rate:  [43-118] 102 (07/24 0630) Resp:  [11-33] 17 (07/24 0630) BP: (77-140)/(49-116) 98/57 (07/24 0630) SpO2:  [93 %-100 %] 97 % (07/24 0630) FiO2 (%):  [21 %] 21 % (07/23 1934) Weight:  [110.3 kg] 110.3 kg (07/24 0300) Last BM Date: 05/31/20  Intake/Output from previous day: 07/23 0701 - 07/24 0700 In: 909.7 [P.O.:100; I.V.:397.2; NG/GT:316; IV Piggyback:96.5] Out: 2595 [Emesis/NG output:4200] Intake/Output this shift: Total I/O In: 495.1 [P.O.:100; I.V.:175.1; NG/GT:220] Out: 2233 [Emesis/NG output:1400; Other:833]  General appearance: Awake, alert  Resp: breathing comfortably on room air Cardio: HR 70s on monitor, regular.  Extremities: edema improveing Abd: Obese, soft, much less distended.   Lab Results:  Recent Labs    05/30/20 0303 05/31/20 0423  WBC 20.3* 17.6*  HGB 9.1* 8.1*  HCT 29.7* 26.7*  PLT 264 245   BMET Recent Labs    05/30/20 1853 05/31/20 0423  NA 140 141  K 4.5 4.5  CL 101 101  CO2 27 29  GLUCOSE 168* 197*  BUN 30* 30*  CREATININE 3.42* 3.33*  CALCIUM 8.5* 8.4*   PT/INR No results for input(s): LABPROT, INR in the last 72 hours. ABG No results for input(s): PHART, HCO3 in the last 72 hours.  Invalid input(s): PCO2, PO2  Studies/Results: DG Abd 1 View  Result Date: 05/30/2020 CLINICAL DATA:  Check feeding catheter placement EXAM: ABDOMEN - 1 VIEW COMPARISON:  05/23/2020 FINDINGS: Nasogastric catheter is been removed and a new weighted feeding catheter is been placed in the mid stomach. IMPRESSION: Feeding catheter within the  stomach. Electronically Signed   By: Inez Catalina M.D.   On: 05/30/2020 08:38   DG Abd Portable 1V  Result Date: 05/30/2020 CLINICAL DATA:  Feeding tube placement. EXAM: PORTABLE ABDOMEN - 1 VIEW COMPARISON:  05/30/2020 FINDINGS: A small bore feeding tube is noted with tip overlying the distal duodenum. An NG tube is present with tip overlying the mid stomach. IMPRESSION: Small bore feeding tube with tip overlying the distal duodenum and NG tube with tip overlying the mid stomach. Electronically Signed   By: Margarette Canada M.D.   On: 05/30/2020 10:49   VAS [place] LOWER EXTREMITY VENOUS (DVT)  Result Date: 05/29/2020  Lower Venous DVTStudy Indications: Edema. Other Indications: Shock, dilated RT ventricle on Echo. Limitations: Poor ultrasound/tissue interface. Comparison Study: No prior studies. Performing Technologist: Darlin Coco  Examination Guidelines: A complete evaluation includes B-mode imaging, spectral Doppler, color Doppler, and power Doppler as needed of all accessible portions of each vessel. Bilateral testing is considered an integral part of a complete examination. Limited examinations for reoccurring indications may be performed as noted. The reflux portion of the exam is performed with the patient in reverse Trendelenburg.  +---------+---------------+---------+-----------+----------+--------------+ RIGHT    CompressibilityPhasicitySpontaneityPropertiesThrombus Aging +---------+---------------+---------+-----------+----------+--------------+ CFV      Full           Yes      Yes                                 +---------+---------------+---------+-----------+----------+--------------+  SFJ      Full                                                        +---------+---------------+---------+-----------+----------+--------------+ FV Prox  Full                                                        +---------+---------------+---------+-----------+----------+--------------+ FV  Mid   Full                                                        +---------+---------------+---------+-----------+----------+--------------+ FV DistalFull                                                        +---------+---------------+---------+-----------+----------+--------------+ PFV      Full                                                        +---------+---------------+---------+-----------+----------+--------------+ POP      Full           Yes      Yes                                 +---------+---------------+---------+-----------+----------+--------------+ PTV      Full                                                        +---------+---------------+---------+-----------+----------+--------------+ PERO     Full                                                        +---------+---------------+---------+-----------+----------+--------------+   +---------+---------------+---------+-----------+----------+--------------+ LEFT     CompressibilityPhasicitySpontaneityPropertiesThrombus Aging +---------+---------------+---------+-----------+----------+--------------+ CFV      Full           Yes      Yes                                 +---------+---------------+---------+-----------+----------+--------------+ SFJ      Full                                                        +---------+---------------+---------+-----------+----------+--------------+  FV Prox  Full                                                        +---------+---------------+---------+-----------+----------+--------------+ FV Mid   Full                                                        +---------+---------------+---------+-----------+----------+--------------+ FV DistalFull                                                        +---------+---------------+---------+-----------+----------+--------------+ PFV      Full                                                         +---------+---------------+---------+-----------+----------+--------------+ POP      Full           Yes      Yes                                 +---------+---------------+---------+-----------+----------+--------------+ PTV      Full           Yes      Yes                                 +---------+---------------+---------+-----------+----------+--------------+ PERO     Full           Yes      Yes                                 +---------+---------------+---------+-----------+----------+--------------+     Summary: RIGHT: - There is no evidence of deep vein thrombosis in the lower extremity.  - No cystic structure found in the popliteal fossa.  LEFT: - There is no evidence of deep vein thrombosis in the lower extremity.  - No cystic structure found in the popliteal fossa.  *See table(s) above for measurements and observations. Electronically signed by Deitra Mayo MD on 05/29/2020 at 11:13:16 PM.    Final     Anti-infectives: Anti-infectives (From admission, onward)   Start     Dose/Rate Route Frequency Ordered Stop   05/27/20 1530  ceFEPIme (MAXIPIME) 2 g in sodium chloride 0.9 % 100 mL IVPB        2 g 200 mL/hr over 30 Minutes Intravenous  Once 05/27/20 1140 05/27/20 1851   05/26/20 2200  ceFEPIme (MAXIPIME) 1 g in sodium chloride 0.9 % 100 mL IVPB  Status:  Discontinued        1 g 200 mL/hr over 30 Minutes Intravenous Every 24 hours 05/25/20 1342 05/27/20 1140   05/22/20 1200  vancomycin (VANCOREADY) IVPB  1250 mg/250 mL  Status:  Discontinued        1,250 mg 166.7 mL/hr over 90 Minutes Intravenous Every 24 hours 05/21/20 1347 05/24/20 0855   05/22/20 0400  vancomycin (VANCOREADY) IVPB 1250 mg/250 mL  Status:  Discontinued        1,250 mg 166.7 mL/hr over 90 Minutes Intravenous Every 24 hours 05/21/20 0218 05/21/20 1201   05/21/20 1201  vancomycin variable dose per unstable renal function (pharmacist dosing)  Status:  Discontinued          Does not apply See admin instructions 05/21/20 1201 05/22/20 0708   05/21/20 0315  vancomycin (VANCOCIN) 2,500 mg in sodium chloride 0.9 % 500 mL IVPB        2,500 mg 250 mL/hr over 120 Minutes Intravenous  Once 05/21/20 0218 05/21/20 0540   05/21/20 0315  ceFEPIme (MAXIPIME) 2 g in sodium chloride 0.9 % 100 mL IVPB  Status:  Discontinued        2 g 200 mL/hr over 30 Minutes Intravenous Every 12 hours 05/21/20 0218 05/25/20 1342   05/16/20 1700  ceFAZolin (ANCEF) IVPB 1 g/50 mL premix        1 g 100 mL/hr over 30 Minutes Intravenous Every 8 hours 05/16/20 1600 05/17/20 0206   05/16/20 0600  ceFAZolin (ANCEF) 3 g in dextrose 5 % 50 mL IVPB        3 g 100 mL/hr over 30 Minutes Intravenous 30 min pre-op 05/15/20 0725 05/16/20 1944      Assessment/Plan:  1 - RIGHT Renal Neoplasm - s/p nephrectomy. no further cancer directed therapy this admission.    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 Appreciate recommendations of Nephrology regarding long term dialysis plan  3 - Uremic encephalopathy / ICU delirium - resolved  4 - Fever / PNA / leukocytosis - resolving, cefepime discontinued. No evidence of intra-abdominal infection on CT scan, and improving PNA. Afebrile, blood cultures negative x 3 days.   5- Acute Blood Loss Anemia - No evidence of active post operative bleeding.   Agree with PRN transfusion if needed from a cardiovascular/hemodynamic standpoint. Stable from a urologic standpoint to continue DVT ppx.  6 - Constipation/Ileus - Now with post pyloric corpak for trickle feeds, NGT to LIWS  7 - Hypotension - continues to require pressor support though decreasing, possibly secondary to volume loss from CRRT. Remains on midodrine   Carmie Kanner 05/31/2020

## 2020-05-31 NOTE — Progress Notes (Signed)
eLink Physician-Brief Progress Note Patient Name: JOSHUS ROGAN DOB: 1946/05/17 MRN: [id number]   Date of Service  05/31/2020  HPI/Events of Note  Patient with reddish NG tube aspirate.  eICU Interventions  Pantoprazole 40 mg bid ordered, Gastroccult testing ordered, a.m. CBC        Mikiah Durall U Darrick Greenlaw 05/31/2020, 1:37 AM

## 2020-05-31 NOTE — Progress Notes (Signed)
[facility] Progress Note  Interval events/Subjective:   Seen on crrt, no acute events, still requiring phenylephrine for pressor support. Bloody/dark red aspirate from ngt, hgb dropped. No change in pressor requirement. Anuric. No complaints at this time.  CRRT rx: BFR 300 PRE 400 DFR 1800CC/HR POST 300 EFF: 2600CC/HR FF 6% M150  Vitals:   05/31/20 0600 05/31/20 0615 05/31/20 0630 05/31/20 0705  BP: (!) 107/53 (!) 95/50 (!) 98/57   Pulse: 89 96 102   Resp: (!) 27 20 17    Temp:    98.9 F (37.2 C)  TempSrc:    Oral  SpO2: 97% 97% 97%   Weight:      Height:        Exam: Gen: nad Heent: +ngt CV: rrr, s1s2, no m/r/g Resp: cta bl, no overt w/r/r/c, normal wob Abd: soft Ext: no edema Neuro: awake, following commands, speech clear and coherent Rt temp hd cath (trialysis)    Home meds:  - asa 17m/ lipitor 40 hs/ benazepril 20 am/ bumex 139mqd/ coreg 25 bid  - allopurinol 300 hs  - symbicort ACT 2 puff bid  - prn's/ vitamins/ supplements  Date                             Creat               egFR   2018                          1.79- 2.38        30- 43   2019                          1.80- 1.81        42-43   January 12, 2020           3.00                    May 13, 2020                2.60                 27   Jul 9                           2.51                 29- 33   Jul 10                         3.42, 3.89    UA none yet   CT abd wo contrast 7/9 - Adrenals/Urinary Tract: Post right nephrectomy. Large volume of hemorrhage in the right nephrectomy bed. Ill-defined air fluid collection tracks in the right retroperitoneum anterior to the iliopsoas muscle and laterally in the abdominal cavity. Hemorrhage tracks into the right aspect of the pelvis in the is only partially included in the field of view. There is moderate subcutaneous emphysema involving the right abdominal wall. Small foci of air dissects between the right abdominal wall musculature.  Simple cyst in the left kidney. There is no left hydronephrosis.  CT abd w/ contrast 05/28/2020 1. Slight decrease in size of right retroperitoneal hematoma following right nephrectomy. 2. No enhancing abscess is present. 3. Improved  subcutaneous gas over the right side of the abdomen. 4. Improved bibasilar airspace disease and effusions, right greater than left. 5. Stable moderate dilation of the transverse colon. 6. Stable dilation of the stomach despite OG tube in place 7. Multilevel degenerative changes in the lumbar spine. 8. Aortic Atherosclerosis (ICD10-I70.0).        Recent Labs  Lab 05/30/20 0236 05/30/20 0303 05/30/20 1853 05/31/20 0423  K   < >  --  4.5 4.5  BUN   < >  --  30* 30*  CREATININE   < >  --  3.42* 3.33*  CALCIUM   < >  --  8.5* 8.4*  PHOS   < >  --  2.6 2.7  HGB  --  9.1*  --  8.1*   < > = values in this interval not displayed.   Inpatient medications:  arformoterol  15 mcg Nebulization BID   aspirin  81 mg Oral Daily   B-complex with vitamin C  1 tablet Oral Daily   budesonide (PULMICORT) nebulizer solution  0.5 mg Nebulization BID   chlorhexidine gluconate (MEDLINE KIT)  15 mL Mouth Rinse BID   Chlorhexidine Gluconate Cloth  6 each Topical Daily   darbepoetin (ARANESP) injection - NON-DIALYSIS  60 mcg Subcutaneous Q Sat-1800   feeding supplement (PROSource TF)  90 mL Per Tube BID   heparin injection (subcutaneous)  5,000 Units Subcutaneous Q8H   insulin aspart  0-9 Units Subcutaneous Q4H   insulin detemir  7 Units Subcutaneous QHS   mouth rinse  15 mL Mouth Rinse q12n4p   melatonin  5 mg Per Tube QHS   midodrine  15 mg Oral TID WC   ondansetron (ZOFRAN) IV  4 mg Intravenous Q6H   pantoprazole (PROTONIX) IV  40 mg Intravenous Q12H   polyethylene glycol  17 g Per Tube Daily   sennosides  5 mL Per Tube BID   sodium chloride flush  10-40 mL Intracatheter Q12H     prismasol BGK 4/2.5 400 mL/hr at 05/30/20 2358   sodium  chloride Stopped (05/27/20 0829)   sodium chloride     feeding supplement (VITAL 1.5 CAL) 1,000 mL (05/30/20 1556)   ferric gluconate (FERRLECIT/NULECIT) IV Stopped (05/30/20 1219)   phenylephrine (NEO-SYNEPHRINE) Adult infusion 100 mcg/min (05/31/20 0600)   prismasol BGK 2/2.5 replacement solution 300 mL/hr at 05/30/20 1808   prismasol BGK 4/2.5 1,800 mL/hr at 05/31/20 0556   vasopressin     sodium chloride, acetaminophen **OR** acetaminophen, albuterol, alum & mag hydroxide-simeth, diphenhydrAMINE **OR** diphenhydrAMINE, heparin, menthol-cetylpyridinium, ondansetron, ondansetron (ZOFRAN) IV, oxyCODONE, phenol, promethazine, Resource ThickenUp Clear, sodium chloride flush, tiZANidine   Assessment/ Plan: 1. Dialysis dependent AoCKD IV - b/l creat 2.60, eGFR 27 then s/p nephrectomy with resultant progressive renal insufficiency and oliguria.  HD catheter placed IR 7/13 - not functional so replaced bedside 7/15 by PCCM.  Off CRRT 7/18, restarted 7/20. 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  Will need transition to tunneled cath at some point especially once more stable  Continue with crrt for now, no changes to bath. Will change UF goals to aim for net even to neg 54m/hr 2. Renal mass - sp R nephrectomy 05/16/20; neg margins on path 3. Anemia ckd / abl - hgb stable today, transfusion/mgmt per primary team. CT 7/9 with hemorrhage in nephrectomy bed - Transfuse per primary. Iron indices show tsat 10%.   Margins were negative, started ESA: aranesp 100 qSat.   Was holding IV iron in setting of septic shock.  Started IV Fe load -- TSAT 10% 7/13 1. New dark/bloody aspirate from ngt, hgb dropped from 9.1 to 8.1, gastroccult pos, mgmt per primary service, monitor serial h/h, is now on PPI 4. Shock, presumed septic? Etiology currently unknown- requiring phenylephrine, continue with midodrine 5. Hyponatremia - resolved 6. Hyperkalemia - resolved, 7. Leukocytosis: persistent, afebrile.  Discussed with daughter and wife at  the bedside about long-term plan yesterday. At this junction it is unclear if hemodialysis is overall permanent however there is a high chance that he will be discharged on dialysis at the very least.  Gean Quint, MD [facility]

## 2020-06-01 LAB — CULTURE, BLOOD (ROUTINE X 2)
Culture: NO GROWTH
Culture: NO GROWTH
Special Requests: ADEQUATE
Special Requests: ADEQUATE

## 2020-06-01 LAB — CBC
HCT: 27.3 % — ABNORMAL LOW (ref 39.0–52.0)
HCT: 29.1 % — ABNORMAL LOW (ref 39.0–52.0)
Hemoglobin: 8.3 g/dL — ABNORMAL LOW (ref 13.0–17.0)
Hemoglobin: 8.7 g/dL — ABNORMAL LOW (ref 13.0–17.0)
MCH: 29 pg (ref 26.0–34.0)
MCH: 29.5 pg (ref 26.0–34.0)
MCHC: 30.4 g/dL (ref 30.0–36.0)
MCHC: 29.9 g/dL — ABNORMAL LOW (ref 30.0–36.0)
MCV: 95.5 fL (ref 80.0–100.0)
MCV: 98.6 fL (ref 80.0–100.0)
Platelets: 225 10*3/uL (ref 150–400)
Platelets: 214 10*3/uL (ref 150–400)
RBC: 2.86 MIL/uL — ABNORMAL LOW (ref 4.22–5.81)
RBC: 2.95 MIL/uL — ABNORMAL LOW (ref 4.22–5.81)
RDW: 18.2 % — ABNORMAL HIGH (ref 11.5–15.5)
RDW: 18.3 % — ABNORMAL HIGH (ref 11.5–15.5)
WBC: 17.7 10*3/uL — ABNORMAL HIGH (ref 4.0–10.5)
WBC: 17.5 10*3/uL — ABNORMAL HIGH (ref 4.0–10.5)
nRBC: 0 % (ref 0.0–0.2)
nRBC: 0 % (ref 0.0–0.2)

## 2020-06-01 LAB — BPAM RBC
Blood Product Expiration Date: 202108282359
ISSUE DATE / TIME: 202107242040
Unit Type and Rh: 5100

## 2020-06-01 LAB — RENAL FUNCTION PANEL
Albumin: 2.5 g/dL — ABNORMAL LOW (ref 3.5–5.0)
Albumin: 2.5 g/dL — ABNORMAL LOW (ref 3.5–5.0)
Anion gap: 11 (ref 5–15)
Anion gap: 9 (ref 5–15)
BUN: 27 mg/dL — ABNORMAL HIGH (ref 8–23)
BUN: 25 mg/dL — ABNORMAL HIGH (ref 8–23)
CO2: 26 mmol/L (ref 22–32)
CO2: 24 mmol/L (ref 22–32)
Calcium: 8.2 mg/dL — ABNORMAL LOW (ref 8.9–10.3)
Calcium: 8.2 mg/dL — ABNORMAL LOW (ref 8.9–10.3)
Chloride: 103 mmol/L (ref 98–111)
Chloride: 104 mmol/L (ref 98–111)
Creatinine, Ser: 3.18 mg/dL — ABNORMAL HIGH (ref 0.61–1.24)
Creatinine, Ser: 3.4 mg/dL — ABNORMAL HIGH (ref 0.61–1.24)
GFR calc Af Amer: 21 mL/min — ABNORMAL LOW (ref >60)
GFR calc Af Amer: 20 mL/min — ABNORMAL LOW (ref >60)
GFR calc non Af Amer: 18 mL/min — ABNORMAL LOW (ref >60)
GFR calc non Af Amer: 17 mL/min — ABNORMAL LOW (ref >60)
Glucose, Bld: 157 mg/dL — ABNORMAL HIGH (ref 70–99)
Glucose, Bld: 173 mg/dL — ABNORMAL HIGH (ref 70–99)
Phosphorus: 2.6 mg/dL (ref 2.5–4.6)
Phosphorus: 2.3 mg/dL — ABNORMAL LOW (ref 2.5–4.6)
Potassium: 4.7 mmol/L (ref 3.5–5.1)
Potassium: 4.5 mmol/L (ref 3.5–5.1)
Sodium: 140 mmol/L (ref 135–145)
Sodium: 137 mmol/L (ref 135–145)

## 2020-06-01 LAB — TYPE AND SCREEN
ABO/RH(D): O POS
Antibody Screen: NEGATIVE
Unit division: 0

## 2020-06-01 LAB — PROTIME-INR
INR: 1.1 (ref 0.8–1.2)
Prothrombin Time: 13.8 seconds (ref 11.4–15.2)

## 2020-06-01 LAB — GLUCOSE, CAPILLARY
Glucose-Capillary: 130 mg/dL — ABNORMAL HIGH (ref 70–99)
Glucose-Capillary: 148 mg/dL — ABNORMAL HIGH (ref 70–99)
Glucose-Capillary: 149 mg/dL — ABNORMAL HIGH (ref 70–99)
Glucose-Capillary: 151 mg/dL — ABNORMAL HIGH (ref 70–99)
Glucose-Capillary: 162 mg/dL — ABNORMAL HIGH (ref 70–99)
Glucose-Capillary: 185 mg/dL — ABNORMAL HIGH (ref 70–99)

## 2020-06-01 LAB — MAGNESIUM: Magnesium: 2.5 mg/dL — ABNORMAL HIGH (ref 1.7–2.4)

## 2020-06-01 NOTE — Progress Notes (Signed)
NAME:  Todd Robertson, MRN:  [id number], DOB:  [birth year], LOS: 28 ADMISSION DATE:  05/16/2020, CONSULTATION DATE:  05/16/2020 REFERRING MD:  Dr Alyson Ingles, CHIEF COMPLAINT:  Shock   Brief History   74 yo male former smoker presented on 7/09 for Rt nephrectomy in setting of renal cell carcinoma.  Post op developed hypotension and PCCM consulted.  Past Medical History  OA, Prostate cancer, Cardiomyopathy (? 2018 LVEF 55-60%), CKD, DM 2, Gout, HTN, Asthma  Significant Hospital Events   7/09 Rt nephrectomy 7/13 Rt IJ HD catheter by IR; worsening hypercapnia and AMS >> intubated, start ABx for HCAP 7/14 start CRRT 7/18 off CRRT 7/20 back on CRRT  Consults:  Nephrology PCCM Urology  Procedures:  Rt IJ HD catheter 7/13 >> ETT 7/13 >> 7/17 Rt radial a line 7/15 > 7/18  Significant Diagnostic Tests:   CT abd/pelvis 7/09 >> s/p Rt nephrectomy.  Large volume of Rt retroperitoneal hemorrhage, moderate subcutaneous emphysema Rt abdominal wall  CT abd/pelvis 7/15 >> decreasing postoperative hemorrhage and subcutaneous emphysema, patchy consolidation b/l  Echo 7/21: LVEF 45-50% with global hypokinesis, mildly dilated RV with normal function. Mildly dilated RA. Mildly dilated ascending aorta.  LE [place] > no DVTs  Micro Data:  MRSA screen 7/09 >> negative Sputum 7/14 >> normal flora Blood 7/14 >>NG Blood 7/20>   Antimicrobials:  Cefepime 7/13 >> 7/20 Vancomycin 7/13 >> 7/17  Interim history/subjective:  No complaints today  Objective   Blood pressure (!) 100/56, pulse 94, temperature 99 F (37.2 C), temperature source Oral, resp. rate 14, height 6\' 2"  (1.88 m), weight (!) 106.4 kg, SpO2 99 %.    FiO2 (%):  [21 %] 21 %   Intake/Output Summary (Last 24 hours) at 06/01/2020 0836 Last data filed at 06/01/2020 0700 Gross per 24 hour  Intake 999.55 ml  Output 1640 ml  Net -640.45 ml   Filed Weights   05/30/20 0300 05/31/20 0300 06/01/20 0500  Weight: (!) 117.8 kg (!) 110.3 kg (!)  106.4 kg    Examination:  General: NAD HEENT: [place]/AT,NGT in place Cardiac No MGR Resp: Breathing comfortably on room air, no increased work of breathing.  CTA B. Abdomen -soft, nontender, nondistended Extremities -no dependent lower extremity edema.  No clubbing or cyanosis. Skin -warm and dry.  No rashes. Neuro: Awake and alert, minimally verbal (only one word answers) Not moving much during exam.   Resolved Hospital Problem list   Septic shock, Acute metabolic encephalopathy 2nd to hypercapnia and renal failure  Assessment & Plan:   Cough,   Shock requiring reinitiation of CRRT- undifferentiated shock.  No evidence of HFrEF, preserved RV function although dilated RV.  No obvious source of sepsis found. -responded well to blood transfusion and additional fluids yesterday.  -Blood cultures negative  -Cont midodrine -Continue Neosynephrine to maintain MAP greater than 65 -No further evidence of bleeding   Acute hypoxic/hypercapnic respiratory failure-resolved HAP- resolved Asthma with hx of tobacco abuse. -Continue to promote good pulmonary hygiene.  -Supplemental oxygen only if SPO2 less than 90%.  Persistent leukocytosis since 7/10. No other clear s/s of infection other than improving HAP. Blood and sputum cultures negative.   AKI on CKD 3 from ATN in setting of septic shock requiring RRT. Hypotension barring iHD.  -Continue CRRT per nephrology.   -Strict I's/O -Renally dose medications  Recurrent nausea and vomiting,  ileus -Continue tube feeds at low rate. -Continue reglan improved motility; need to watch QTC  S/p Rt nephrectomy for renal  cell carcinoma. -Appreciate urology's assistance with postop care.  Anemia from acute blood loss after surgery and chronic disease.   -Daily CBC unless signs of bleeding-stable this afternoon -Transfuse for hemoglobin less than 7 or hemodynamically significant bleeding -We will defer to nephrology decision for iron infusion and  EPO  GIB  Bright red blood from NG tube yesterday, resolved after held asa and ppx dose heparin.  GI consulted today (for both ileus and GIB),.  Heparin and asa still held.   DM type 2, hyperglycemia controlled  -Increase Levemir to 7 units daily -Continue sliding scale insulin as needed -Goal BG 140-180 while admitted to the ICU  Chronic HFpEF (EF not significantly changed this admission), HLD.  PACs and possibly a few dropped beats on tele. No Afib. -Continue telemetry  Hx of gout -Holding PTA allopurinol -Dialysis  Deconditioning -Continue PT and OT   Delirium with prominent negative symptoms- resolved -OOB mobility; appreciate PT's assistance -Day-night reorientation -Continue nightly melatonin-increase to 5 mg -Delirium precautions    Best practice:  Diet: tube feeds, modified diet DVT prophylaxis: held due to bleed, SCDs GI prophylaxis: n/a Mobility: up with assist Code Status: full Disposition: ICU Family: daughter & wife updated at bedside daily  Labs    CMP Latest Ref Rng & Units 06/01/2020 05/31/2020 05/31/2020  Glucose 70 - 99 mg/dL 157(H) 162(H) 197(H)  BUN 8 - 23 mg/dL 27(H) 31(H) 30(H)  Creatinine 0.61 - 1.24 mg/dL 3.18(H) 3.22(H) 3.33(H)  Sodium 135 - 145 mmol/L 140 138 141  Potassium 3.5 - 5.1 mmol/L 4.7 5.1 4.5  Chloride 98 - 111 mmol/L 103 102 101  CO2 22 - 32 mmol/L 26 25 29   Calcium 8.9 - 10.3 mg/dL 8.2(L) 8.2(L) 8.4(L)  Total Protein 6.5 - 8.1 g/dL - - -  Total Bilirubin 0.3 - 1.2 mg/dL - - -  Alkaline Phos 38 - 126 U/L - - -  AST 15 - 41 U/L - - -  ALT 0 - 44 U/L - - -    CBC Latest Ref Rng & Units 06/01/2020 05/31/2020 05/31/2020  WBC 4.0 - 10.5 K/uL 17.7(H) - 17.6(H)  Hemoglobin 13.0 - 17.0 g/dL 8.3(L) 7.6(L) 8.1(L)  Hematocrit 39 - 52 % 27.3(L) 24.9(L) 26.7(L)  Platelets 150 - 400 K/uL 225 - 245    ABG    Component Value Date/Time   PHART 7.409 05/23/2020 0400   PCO2ART 38.5 05/23/2020 0400   PO2ART 85 05/23/2020 0400   HCO3 24.4  05/23/2020 0400   TCO2 26 05/23/2020 0400   ACIDBASEDEF 1.0 05/20/2020 2308   O2SAT 97.0 05/23/2020 0400    CBG (last 3)  Recent Labs    05/31/20 2345 06/01/20 0330 06/01/20 0728  GLUCAP 156* 130* 149*       This patient is critically ill with multiple organ system failure which requires frequent high complexity decision making, assessment, support, evaluation, and titration of therapies. This was completed through the application of advanced monitoring technologies and extensive interpretation of multiple databases. During this encounter critical care time was devoted to patient care services described in this note for 32 minutes.  Collier Bullock, DO 06/01/20 8:36 AM [facility]

## 2020-06-01 NOTE — Progress Notes (Signed)
[facility] Progress Note  Interval events/Subjective:   Seen on crrt, no acute events/tolerating CRRT, still requiring phenylephrine for pressor support.  No complaints at this time.  CRRT rx: BFR 430 PRE 400 DFR 1800CC/HR POST 300 EFF: 2600CC/HR Fluid removal rate: 30cc/hr FF 4% M150 RIJ temporary dialysis catheter  Vitals:   06/01/20 1000 06/01/20 1100 06/01/20 1114 06/01/20 1200  BP: (!) 114/56 (!) 131/65  (!) 126/64  Pulse: 97 (!) 108  103  Resp: _0 Temp:   100.1 F (37.8 C)   TempSrc:   Oral   SpO2: 100% 99%  98%  Weight:      Height:        Exam: Gen: nad Heent: +ngt CV: rrr, s1s2, no m/r/g Resp: cta bl, no overt w/r/r/c, normal wob Abd: soft, distension significantly improved Ext: no edema Neuro: awake, following commands, speech clear and coherent Rt temp hd cath (trialysis)    Home meds:  - asa 52m/ lipitor 40 hs/ benazepril 20 am/ bumex 131mqd/ coreg 25 bid  - allopurinol 300 hs  - symbicort ACT 2 puff bid  - prn's/ vitamins/ supplements  Date                             Creat               egFR   2018                          1.79- 2.38        30- 43   2019                          1.80- 1.81        42-43   January 12, 2020           3.00                    May 13, 2020                2.60                 27   Jul 9                           2.51                 29- 33   Jul 10                         3.42, 3.89    UA none yet   CT abd wo contrast 7/9 - Adrenals/Urinary Tract: Post right nephrectomy. Large volume of hemorrhage in the right nephrectomy bed. Ill-defined air fluid collection tracks in the right retroperitoneum anterior to the iliopsoas muscle and laterally in the abdominal cavity. Hemorrhage tracks into the right aspect of the pelvis in the is only partially included in the field of view. There is moderate subcutaneous emphysema involving the right abdominal wall. Small foci of air dissects between the right  abdominal wall musculature. Simple cyst in the left kidney. There is no left hydronephrosis.  CT abd w/ contrast 05/28/2020 1. Slight decrease in size of right retroperitoneal hematoma following right nephrectomy. 2. No enhancing abscess is present. 3. Improved  subcutaneous gas over the right side of the abdomen. 4. Improved bibasilar airspace disease and effusions, right greater than left. 5. Stable moderate dilation of the transverse colon. 6. Stable dilation of the stomach despite OG tube in place 7. Multilevel degenerative changes in the lumbar spine. 8. Aortic Atherosclerosis (ICD10-I70.0).        Recent Labs  Lab 05/31/20 1132 05/31/20 1559 06/01/20 0232 06/01/20 1157  K  --  5.1 4.7  --   BUN  --  31* 27*  --   CREATININE  --  3.22* 3.18*  --   CALCIUM  --  8.2* 8.2*  --   PHOS  --  2.6 2.6  --   HGB   < >  --  8.3* 8.7*   < > = values in this interval not displayed.   Inpatient medications: . arformoterol  15 mcg Nebulization BID  . B-complex with vitamin C  1 tablet Oral Daily  . budesonide (PULMICORT) nebulizer solution  0.5 mg Nebulization BID  . chlorhexidine gluconate (MEDLINE KIT)  15 mL Mouth Rinse BID  . Chlorhexidine Gluconate Cloth  6 each Topical Daily  . darbepoetin (ARANESP) injection - NON-DIALYSIS  60 mcg Subcutaneous Q Sat-1800  . feeding supplement (PROSource TF)  90 mL Per Tube BID  . insulin aspart  0-9 Units Subcutaneous Q4H  . insulin detemir  7 Units Subcutaneous QHS  . mouth rinse  15 mL Mouth Rinse q12n4p  . melatonin  5 mg Per Tube QHS  . metoCLOPramide  5 mg Per Tube TID AC  . midodrine  15 mg Oral TID WC  . pantoprazole (PROTONIX) IV  40 mg Intravenous Q12H  . polyethylene glycol  17 g Per Tube Daily  . sennosides  5 mL Per Tube BID  . sodium chloride flush  10-40 mL Intracatheter Q12H   .  prismasol BGK 4/2.5 400 mL/hr at 06/01/20 0959  . sodium chloride Stopped (05/27/20 0829)  . sodium chloride    . feeding supplement (VITAL  1.5 CAL) 1,000 mL (06/01/20 1222)  . phenylephrine (NEO-SYNEPHRINE) Adult infusion 60 mcg/min (06/01/20 1200)  . prismasol BGK 2/2.5 replacement solution 300 mL/hr at 06/01/20 0606  . prismasol BGK 4/2.5 1,800 mL/hr at 06/01/20 1156  . vasopressin     sodium chloride, acetaminophen **OR** acetaminophen, albuterol, alum & mag hydroxide-simeth, diphenhydrAMINE **OR** diphenhydrAMINE, heparin, menthol-cetylpyridinium, ondansetron (ZOFRAN) IV, phenol, promethazine, Resource ThickenUp Clear, sodium chloride flush, tiZANidine   Assessment/ Plan: 1. Dialysis dependent AoCKD IV - b/l creat 2.60, eGFR 27 then s/p nephrectomy with resultant progressive renal insufficiency and oliguria.  HD catheter placed IR 7/13 - not functional so replaced bedside 7/15 by PCCM.  Off CRRT 7/18, restarted 7/20. 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  Will need transition to tunneled cath at some point especially once more stable  Continue with crrt for now, no changes to bath. UF goals to aim for net even to neg 64m/hr  Labs reviewed 2. Renal mass - sp R nephrectomy 05/16/20; neg margins on path 3. Anemia ckd / abl - hgb stable today, transfusion/mgmt per primary team. CT 7/9 with hemorrhage in nephrectomy bed - Transfuse per primary. Iron indices show tsat 10%.   Margins were negative, started ESA: aranesp 100 qSat.   Was holding IV iron in setting of septic shock. Started IV Fe load -- TSAT 10% 7/13 1. New dark/bloody aspirate from ngt, hgb dropped from 9.1 to 8.1, gastroccult pos, mgmt per primary service, monitor serial h/h, is now on  PPI 4. Shock, presumed septic? Etiology currently unknown- requiring phenylephrine, continue with midodrine via ngt 5. Hyponatremia - resolved 6. Hyperkalemia - resolved, 7. Leukocytosis: persistent, afebrile  Gean Quint, MD [facility]

## 2020-06-01 NOTE — Progress Notes (Signed)
16 Days Post-Op   Subjective/Chief Complaint:  Tmax 100.3 overnight. Continues to require phenylephrine NGT in place with 750cc output past 24 hours. Trickle feeds via corpak. 974 cc off from CRRT.   Objective: Vital signs in last 24 hours: Temp:  [98 F (36.7 C)-100.3 F (37.9 C)] 99 F (37.2 C) (07/25 0731) Pulse Rate:  [83-125] 94 (07/25 0700) Resp:  [14-34] 14 (07/25 0700) BP: (74-144)/(44-105) 100/56 (07/25 0700) SpO2:  [92 %-100 %] 99 % (07/25 0700) FiO2 (%):  [21 %] 21 % (07/24 1935) Weight:  [106.4 kg] 106.4 kg (07/25 0500) Last BM Date: 05/31/20  Intake/Output from previous day: 07/24 0701 - 07/25 0700 In: 1034.5 [I.V.:253; NG/GT:480; IV Piggyback:301.5] Out: 1725 [Emesis/NG output:750; Stool:1] Intake/Output this shift: No intake/output data recorded.  General appearance: Awake, alert  Resp: breathing comfortably on room air Cardio: HR 70s on monitor, regular.  Extremities: edema improving Abd: Obese, soft, much less distended.   Lab Results:  Recent Labs    05/31/20 0423 05/31/20 0423 05/31/20 1132 06/01/20 0232  WBC 17.6*  --   --  17.7*  HGB 8.1*   < > 7.6* 8.3*  HCT 26.7*   < > 24.9* 27.3*  PLT 245  --   --  225   < > = values in this interval not displayed.   BMET Recent Labs    05/31/20 1559 06/01/20 0232  NA 138 140  K 5.1 4.7  CL 102 103  CO2 25 26  GLUCOSE 162* 157*  BUN 31* 27*  CREATININE 3.22* 3.18*  CALCIUM 8.2* 8.2*   PT/INR No results for input(s): LABPROT, INR in the last 72 hours. ABG No results for input(s): PHART, HCO3 in the last 72 hours.  Invalid input(s): PCO2, PO2  Studies/Results: DG Abd 1 View  Result Date: 05/30/2020 CLINICAL DATA:  Check feeding catheter placement EXAM: ABDOMEN - 1 VIEW COMPARISON:  05/23/2020 FINDINGS: Nasogastric catheter is been removed and a new weighted feeding catheter is been placed in the mid stomach. IMPRESSION: Feeding catheter within the stomach. Electronically Signed   By: Inez Catalina M.D.   On: 05/30/2020 08:38   DG Abd Portable 1V  Result Date: 05/30/2020 CLINICAL DATA:  Feeding tube placement. EXAM: PORTABLE ABDOMEN - 1 VIEW COMPARISON:  05/30/2020 FINDINGS: A small bore feeding tube is noted with tip overlying the distal duodenum. An NG tube is present with tip overlying the mid stomach. IMPRESSION: Small bore feeding tube with tip overlying the distal duodenum and NG tube with tip overlying the mid stomach. Electronically Signed   By: Margarette Canada M.D.   On: 05/30/2020 10:49    Anti-infectives: Anti-infectives (From admission, onward)   Start     Dose/Rate Route Frequency Ordered Stop   05/27/20 1530  ceFEPIme (MAXIPIME) 2 g in sodium chloride 0.9 % 100 mL IVPB        2 g 200 mL/hr over 30 Minutes Intravenous  Once 05/27/20 1140 05/27/20 1851   05/26/20 2200  ceFEPIme (MAXIPIME) 1 g in sodium chloride 0.9 % 100 mL IVPB  Status:  Discontinued        1 g 200 mL/hr over 30 Minutes Intravenous Every 24 hours 05/25/20 1342 05/27/20 1140   05/22/20 1200  vancomycin (VANCOREADY) IVPB 1250 mg/250 mL  Status:  Discontinued        1,250 mg 166.7 mL/hr over 90 Minutes Intravenous Every 24 hours 05/21/20 1347 05/24/20 0855   05/22/20 0400  vancomycin (VANCOREADY) IVPB 1250 mg/250  mL  Status:  Discontinued        1,250 mg 166.7 mL/hr over 90 Minutes Intravenous Every 24 hours 05/21/20 0218 05/21/20 1201   05/21/20 1201  vancomycin variable dose per unstable renal function (pharmacist dosing)  Status:  Discontinued         Does not apply See admin instructions 05/21/20 1201 05/22/20 0708   05/21/20 0315  vancomycin (VANCOCIN) 2,500 mg in sodium chloride 0.9 % 500 mL IVPB        2,500 mg 250 mL/hr over 120 Minutes Intravenous  Once 05/21/20 0218 05/21/20 0540   05/21/20 0315  ceFEPIme (MAXIPIME) 2 g in sodium chloride 0.9 % 100 mL IVPB  Status:  Discontinued        2 g 200 mL/hr over 30 Minutes Intravenous Every 12 hours 05/21/20 0218 05/25/20 1342   05/16/20 1700   ceFAZolin (ANCEF) IVPB 1 g/50 mL premix        1 g 100 mL/hr over 30 Minutes Intravenous Every 8 hours 05/16/20 1600 05/17/20 0206   05/16/20 0600  ceFAZolin (ANCEF) 3 g in dextrose 5 % 50 mL IVPB        3 g 100 mL/hr over 30 Minutes Intravenous 30 min pre-op 05/15/20 0725 05/16/20 1944      Assessment/Plan:  1 - RIGHT Renal Neoplasm - s/p nephrectomy. no further cancer directed therapy this admission.    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 Appreciate recommendations of Nephrology regarding long term dialysis plan  3 - Uremic encephalopathy / ICU delirium - resolved  4 - Fever / PNA / leukocytosis - resolving, cefepime discontinued. No evidence of intra-abdominal infection on CT scan, and improving PNA. Afebrile, blood cultures negative x 3 days. Low concern for active infection.    5- Acute Blood Loss Anemia - No evidence of active post operative bleeding.   Agree with PRN transfusion if needed from a cardiovascular/hemodynamic standpoint. Stable from a urologic standpoint to continue DVT ppx.  6 - Constipation/Ileus - Now with post pyloric corpak for trickle feeds, NGT to LIWS  7 - Hypotension - continues to require pressor support though decreasing, possibly secondary to volume loss from CRRT. Remains on midodrine, dose increased yesterday   Todd Robertson 06/01/2020

## 2020-06-02 DIAGNOSIS — E440 Moderate protein-calorie malnutrition: Secondary | ICD-10-CM | POA: Insufficient documentation

## 2020-06-02 DIAGNOSIS — I9589 Other hypotension: Secondary | ICD-10-CM

## 2020-06-02 LAB — RENAL FUNCTION PANEL
Albumin: 2.7 g/dL — ABNORMAL LOW (ref 3.5–5.0)
Albumin: 2.4 g/dL — ABNORMAL LOW (ref 3.5–5.0)
Anion gap: 11 (ref 5–15)
Anion gap: 11 (ref 5–15)
BUN: 24 mg/dL — ABNORMAL HIGH (ref 8–23)
BUN: 42 mg/dL — ABNORMAL HIGH (ref 8–23)
CO2: 26 mmol/L (ref 22–32)
CO2: 22 mmol/L (ref 22–32)
Calcium: 8.7 mg/dL — ABNORMAL LOW (ref 8.9–10.3)
Calcium: 8.2 mg/dL — ABNORMAL LOW (ref 8.9–10.3)
Chloride: 102 mmol/L (ref 98–111)
Chloride: 103 mmol/L (ref 98–111)
Creatinine, Ser: 3.26 mg/dL — ABNORMAL HIGH (ref 0.61–1.24)
Creatinine, Ser: 5.81 mg/dL — ABNORMAL HIGH (ref 0.61–1.24)
GFR calc Af Amer: 21 mL/min — ABNORMAL LOW (ref >60)
GFR calc Af Amer: 10 mL/min — ABNORMAL LOW (ref >60)
GFR calc non Af Amer: 18 mL/min — ABNORMAL LOW (ref >60)
GFR calc non Af Amer: 9 mL/min — ABNORMAL LOW (ref >60)
Glucose, Bld: 150 mg/dL — ABNORMAL HIGH (ref 70–99)
Glucose, Bld: 289 mg/dL — ABNORMAL HIGH (ref 70–99)
Phosphorus: 2.3 mg/dL — ABNORMAL LOW (ref 2.5–4.6)
Phosphorus: 3.5 mg/dL (ref 2.5–4.6)
Potassium: 4.4 mmol/L (ref 3.5–5.1)
Potassium: 5.1 mmol/L (ref 3.5–5.1)
Sodium: 139 mmol/L (ref 135–145)
Sodium: 136 mmol/L (ref 135–145)

## 2020-06-02 LAB — CBC
HCT: 28.5 % — ABNORMAL LOW (ref 39.0–52.0)
HCT: 23.4 % — ABNORMAL LOW (ref 39.0–52.0)
Hemoglobin: 8.6 g/dL — ABNORMAL LOW (ref 13.0–17.0)
Hemoglobin: 7.2 g/dL — ABNORMAL LOW (ref 13.0–17.0)
MCH: 29.1 pg (ref 26.0–34.0)
MCH: 29.3 pg (ref 26.0–34.0)
MCHC: 30.2 g/dL (ref 30.0–36.0)
MCHC: 30.8 g/dL (ref 30.0–36.0)
MCV: 96.3 fL (ref 80.0–100.0)
MCV: 95.1 fL (ref 80.0–100.0)
Platelets: 186 10*3/uL (ref 150–400)
Platelets: 209 10*3/uL (ref 150–400)
RBC: 2.96 MIL/uL — ABNORMAL LOW (ref 4.22–5.81)
RBC: 2.46 MIL/uL — ABNORMAL LOW (ref 4.22–5.81)
RDW: 18.1 % — ABNORMAL HIGH (ref 11.5–15.5)
RDW: 17.8 % — ABNORMAL HIGH (ref 11.5–15.5)
WBC: 14.1 10*3/uL — ABNORMAL HIGH (ref 4.0–10.5)
WBC: 12.1 10*3/uL — ABNORMAL HIGH (ref 4.0–10.5)
nRBC: 0 % (ref 0.0–0.2)
nRBC: 0 % (ref 0.0–0.2)

## 2020-06-02 LAB — GLUCOSE, CAPILLARY
Glucose-Capillary: 144 mg/dL — ABNORMAL HIGH (ref 70–99)
Glucose-Capillary: 139 mg/dL — ABNORMAL HIGH (ref 70–99)
Glucose-Capillary: 178 mg/dL — ABNORMAL HIGH (ref 70–99)
Glucose-Capillary: 217 mg/dL — ABNORMAL HIGH (ref 70–99)
Glucose-Capillary: 258 mg/dL — ABNORMAL HIGH (ref 70–99)
Glucose-Capillary: 260 mg/dL — ABNORMAL HIGH (ref 70–99)

## 2020-06-02 LAB — MAGNESIUM
Magnesium: 2.6 mg/dL — ABNORMAL HIGH (ref 1.7–2.4)
Magnesium: 2.5 mg/dL — ABNORMAL HIGH (ref 1.7–2.4)

## 2020-06-02 MED ORDER — NEPRO/CARBSTEADY PO LIQD
1000.0000 mL | ORAL | Status: DC
  Administered 2020-06-02: 1000 mL
  Filled 2020-06-02 (×2): qty 1000

## 2020-06-02 MED ORDER — ALTEPLASE 2 MG IJ SOLR
2.0000 mg | Freq: Once | INTRAMUSCULAR | Status: AC
  Administered 2020-06-02: 2 mg
  Filled 2020-06-02: qty 2

## 2020-06-02 MED ORDER — HYDROCORTISONE NA SUCCINATE PF 100 MG IJ SOLR
50.0000 mg | Freq: Four times a day (QID) | INTRAMUSCULAR | Status: DC
  Administered 2020-06-02 – 2020-06-05 (×12): 50 mg via INTRAVENOUS
  Filled 2020-06-02 (×13): qty 2

## 2020-06-02 MED ORDER — SODIUM ZIRCONIUM CYCLOSILICATE 5 G PO PACK
10.0000 g | PACK | Freq: Once | ORAL | Status: AC
  Administered 2020-06-02: 10 g via ORAL
  Filled 2020-06-02: qty 2

## 2020-06-02 MED ORDER — LOPERAMIDE HCL 1 MG/7.5ML PO SUSP
2.0000 mg | ORAL | Status: DC | PRN
  Administered 2020-06-02 – 2020-06-03 (×2): 2 mg
  Filled 2020-06-02 (×3): qty 15

## 2020-06-02 MED ORDER — PANTOPRAZOLE SODIUM 40 MG PO PACK
40.0000 mg | PACK | ORAL | Status: DC
  Administered 2020-06-02: 40 mg
  Filled 2020-06-02: qty 20

## 2020-06-02 MED ORDER — VITAL 1.5 CAL PO LIQD
1000.0000 mL | ORAL | Status: DC
  Administered 2020-06-02: 1000 mL
  Filled 2020-06-02 (×2): qty 1000

## 2020-06-02 MED ORDER — NEPRO/CARBSTEADY PO LIQD
1000.0000 mL | ORAL | Status: DC
  Filled 2020-06-02: qty 1000

## 2020-06-02 NOTE — PMR Pre-admission (Signed)
PMR Admission Coordinator Pre-Admission Assessment  Patient: Todd Robertson is an 74 y.o., male MRN: [id number] DOB: [birth year] Height: _0  (188 cm) Weight: 109.5 kg  Insurance Information HMO:     PPO:      PCP:      IPA:      80/20: yes     OTHER:  PRIMARY: Medicare A and B      Policy#: [id number]      Subscriber: patient CM Name:       Phone#:      Fax#:  Pre-Cert#:       Employer:  Benefits:  Phone #: online     Name: verified eligibility online via [place] on 06/02/20 Eff. Date: Part A and B effective 02/06/2013     Deduct: $1,484      Out of Pocket Max: NA      Life Max: NA CIR: Covered per Medicare guidelines once yearly deductible has been met      SNF: days 1-20, 100%, days 21-100, 80% Outpatient: 80%     Co-Pay: 20% Home Health: 100%      Co-Pay:  DME: 80%     Co-Pay: 20% Providers: Pt's choice SECONDARY: AARP      Policy#: [id number]     Phone#: [phone]  Financial Counselor:       Phone#:   The "Data Collection Information Summary" for patients in Inpatient Rehabilitation Facilities with attached "Privacy Act [place] Records" was provided and verbally reviewed with: Patient and Family  Emergency Contact Information Contact Information    Name Relation Home Work Mobile   [place] Spouse [phone]  [place] Daughter   [phone]   [place], [place] Sister   [phone]      Current Medical History  Patient Admitting Diagnosis: Debility from multiple medical complications   History of Present Illness: Pt is a 74 yo male with history of right renal mass, prostate cancer, OA, cardiomyopathy, CKD, DM2, gout, HTN, and asthma. Pt presented to the hospital for an elective right nephrectomy on 05/16/20. Post op, pt was noted to have increased abdominal swelling and progressively lowering BP. CT noted a large volume right retroperitoneal hemorrhage with heterogeneous air. PCCM was consulted to assess for circulatory shock  and concern about hemodynamics. Pt also developed AKI with poor urine output. Unfortunately, the patient also experienced new emesis POD2 and gaseous distentsion, with improvement after suppository. On 7/13 pt with worsening mentation and concerns for airway protection; he was then intubated for respiratory failure. Pt also noted to be febrile with CXR showing RLL consolidation, diffuse opacities.Pt was started on CRRT on 7/14 in the setting of septic shock. Pt was extubated 7/17 and transitioned off CRRT, only to be placed back on on 7/20. Pt continued with uremic encephalopathy/ICU delirium and struggled with ileus, needing NGT for decompression. Pt was transitioned off CRRT and iHD initiated. Pt was seen by therapies with recommendation for CIR due to functional decline. Pt is to admit to CIR on 08/03/212.   Patient's medical record from [facility] has been reviewed by the rehabilitation admission coordinator and physician.  Past Medical History  Past Medical History:  Diagnosis Date  . Arthritis   . Cancer [facility])    prostate  . Cardiomyopathy    secondary  . CKD (chronic kidney disease)   . DM2 (diabetes mellitus, type 2) ([place])   . Gout   . HTN (hypertension)    unspec  . Hypercholesterolemia   .  Nonischemic cardiomyopathy ([place])    a. EF 40-50% by most recent 2-D echo b. EF 25% by cardiac cath in 2004  . Overweight(278.02)     Family History   family history includes Heart disease in his father; Hypertension in his mother; Kidney disease in his mother and another family member.  Prior Rehab/Hospitalizations Has the patient had prior rehab or hospitalizations prior to admission? No  Has the patient had major surgery during 100 days prior to admission? Yes   Current Medications  Current Facility-Administered Medications:  .  0.9 %  sodium chloride infusion, , Intravenous, PRN, Ulyses Amor, PA-C, Stopped at 05/27/20 [phone] .  0.9 %  sodium chloride infusion, 250  mL, Intravenous, Continuous, Collins, Emma M, PA-C, New Bag at 06/06/20 1330 .  0.9 %  sodium chloride infusion, 100 mL, Intravenous, PRN, Theda Sers, Emma M, PA-C .  0.9 %  sodium chloride infusion, 100 mL, Intravenous, PRN, Theda Sers, Emma M, PA-C .  acetaminophen (TYLENOL) tablet 650 mg, 650 mg, Oral, Q6H PRN, 650 mg at 06/09/20 1121 **OR** acetaminophen (TYLENOL) suppository 650 mg, 650 mg, Rectal, Q4H PRN, Theda Sers, Emma M, PA-C .  arformoterol (BROVANA) nebulizer solution 15 mcg, 15 mcg, Nebulization, BID, Laurence Slate M, [place], 15 mcg at 06/10/20 5625 .  budesonide (PULMICORT) nebulizer solution 0.5 mg, 0.5 mg, Nebulization, BID, Collins, Emma M, PA-C, 0.5 mg at 06/10/20 6389 .  chlorhexidine gluconate (MEDLINE KIT) (PERIDEX) 0.12 % solution 15 mL, 15 mL, Mouth Rinse, BID, Collins, Emma M, PA-C, 15 mL at 06/09/20 2141 .  Chlorhexidine Gluconate Cloth 2 % PADS 6 each, 6 each, Topical, Q0600, Corliss Parish, MD, 6 each at 06/10/20 0555 .  Darbepoetin Alfa (ARANESP) injection 200 mcg, 200 mcg, Subcutaneous, Q Sat-1800, Collins, Emma M, PA-C, 200 mcg at 06/07/20 1216 .  diphenhydrAMINE (BENADRYL) injection 12.5 mg, 12.5 mg, Intravenous, Q6H PRN **OR** diphenhydrAMINE (BENADRYL) 12.5 MG/5ML elixir 12.5 mg, 12.5 mg, Per Tube, Q6H PRN, Theda Sers, Emma M, PA-C .  feeding supplement (PROSource TF) liquid 90 mL, 90 mL, Per Tube, BID, Laurence Slate M, PA-C, 90 mL at 06/09/20 1138 .  feeding supplement (VITAL 1.5 CAL) liquid 1,190 mL, 1,190 mL, Per Tube, Continuous, Collins, Emma M, PA-C, Stopped at 06/07/20 0654 .  heparin injection 1,000 Units, 1,000 Units, Dialysis, PRN, Laurence Slate M, PA-C .  heparin injection 1,000-6,000 Units, 1,000-6,000 Units, CRRT, PRN, Ulyses Amor, PA-C, 3,000 Units at 06/02/20 0830 .  hydrocortisone (CORTEF) tablet 10 mg, 10 mg, Oral, BID WC, Pokhrel, Laxman, MD, 10 mg at 06/10/20 0827 .  insulin aspart (novoLOG) injection 0-9 Units, 0-9 Units, Subcutaneous, TID AC & HS,  Pokhrel, Laxman, MD, 1 Units at 06/09/20 2144 .  insulin aspart (novoLOG) injection 2 Units, 2 Units, Subcutaneous, Q4H, Pokhrel, Laxman, MD, 2 Units at 06/09/20 2143 .  insulin detemir (LEVEMIR) injection 14 Units, 14 Units, Subcutaneous, QHS, Pokhrel, Laxman, MD, 14 Units at 06/09/20 2145 .  levalbuterol (XOPENEX) nebulizer solution 0.63 mg, 0.63 mg, Nebulization, Q8H PRN, Pokhrel, Laxman, MD .  lidocaine (XYLOCAINE) 2 % viscous mouth solution 15 mL, 15 mL, Mouth/Throat, Q6H PRN, Ulyses Amor, PA-C, 15 mL at 06/10/20 0933 .  loperamide HCl (IMODIUM) 1 MG/7.5ML suspension 2 mg, 2 mg, Per Tube, PRN, Ulyses Amor, PA-C, 2 mg at 06/03/20 0816 .  MEDLINE mouth rinse, 15 mL, Mouth Rinse, q12n4p, Laurence Slate M, PA-C, 15 mL at 06/09/20 1702 .  menthol-cetylpyridinium (CEPACOL) lozenge 3 mg, 1 lozenge, Oral, PRN, Ulyses Amor,  PA-C, 3 mg at 05/19/20 1000 .  metoprolol tartrate (LOPRESSOR) injection 2.5 mg, 2.5 mg, Intravenous, Q6H PRN, Pokhrel, Laxman, MD .  metoprolol tartrate (LOPRESSOR) tablet 12.5 mg, 12.5 mg, Oral, BID, Pokhrel, Laxman, MD, 12.5 mg at 06/09/20 2147 .  midodrine (PROAMATINE) tablet 5 mg, 5 mg, Oral, TID WC, Pokhrel, Laxman, MD, 5 mg at 06/10/20 0826 .  ondansetron (ZOFRAN) injection 4 mg, 4 mg, Intravenous, Q6H PRN, Ulyses Amor, PA-C, 4 mg at 06/07/20 0016 .  phenol (CHLORASEPTIC) mouth spray 1 spray, 1 spray, Mouth/Throat, PRN, Ulyses Amor, PA-C, 1 spray at 06/03/20 1122 .  promethazine (PHENERGAN) injection 12.5 mg, 12.5 mg, Intravenous, Q6H PRN, Laurence Slate M, PA-C, 12.5 mg at 05/18/20 0630 .  [employer], , Oral, PRN, Ulyses Amor, PA-C .  sodium chloride flush (NS) 0.9 % injection 10-40 mL, 10-40 mL, Intracatheter, Q12H, Collins, Emma M, PA-C, 10 mL at 06/09/20 2147 .  sodium chloride flush (NS) 0.9 % injection 10-40 mL, 10-40 mL, Intracatheter, PRN, Ulyses Amor, PA-C, 10 mL at 06/01/20 1235 .  tiZANidine (ZANAFLEX) tablet 4 mg, 4 mg, Per  Tube, Q8H PRN, Ulyses Amor, PA-C  Patients Current Diet:  Diet Order            Diet heart healthy/carb modified Room service appropriate? Yes; Fluid consistency: Thin  Diet effective now                 Precautions / Restrictions Precautions Precautions: Fall Precaution Comments: watch vitals, NG tube, cortrak Restrictions Weight Bearing Restrictions: No   Has the patient had 2 or more falls or a fall with injury in the past year? No  Prior Activity Level Community (5-7x/wk): retired, driving PTA, Independent PTA.   Prior Functional Level Self Care: Did the patient need help bathing, dressing, using the toilet or eating? Independent  Indoor Mobility: Did the patient need assistance with walking from room to room (with or without device)? Independent  Stairs: Did the patient need assistance with internal or external stairs (with or without device)? Independent  Functional Cognition: Did the patient need help planning regular tasks such as shopping or remembering to take medications? Independent  Home Assistive Devices / [place] Devices/Equipment: Eyeglasses Home Equipment: Walker - 2 wheels, Bedside commode  Prior Device Use: Indicate devices/aids used by the patient prior to current illness, exacerbation or injury? None of the above  Current Functional Level Cognition  Overall Cognitive Status: Within Functional Limits for tasks assessed Orientation Level: Oriented X4 General Comments: requires encouragement to engage with therapist as he is initially very limited in verbalizations, appears [facility] for basic tasks today     Extremity Assessment (includes Sensation/Coordination)  Upper Extremity Assessment: Generalized weakness RUE Deficits / Details: decreased shoulder ROM (flexion/abduction/horizontal adduction) grip WNL RUE Sensation: WNL RUE Coordination: decreased gross motor LUE Deficits / Details: decreased shoulder AROM in all  directions LUE Sensation: WNL LUE Coordination: decreased gross motor  Lower Extremity Assessment: Generalized weakness    ADLs  Overall ADL's : Needs assistance/impaired Eating/Feeding: NPO Grooming: Wash/dry hands, Set up, Sitting Grooming Details (indicate cue type and reason): for support at elbow  Upper Body Bathing: Minimal assistance, Sitting, Bed level Upper Body Bathing Details (indicate cue type and reason): minA for support at elbow for shoulder ROM to access armpits for washing and donning deodorant Lower Body Bathing: Total assistance Lower Body Bathing Details (indicate cue type and reason): peri care during session due to void.  Upper Body Dressing : Maximal assistance, Sitting Lower Body Dressing: Maximal assistance Lower Body Dressing Details (indicate cue type and reason): modA+2 to stand  Toilet Transfer: Minimal assistance, +2 for safety/equipment, Ambulation, RW, Grab bars, Regular Toilet Toilet Transfer Details (indicate cue type and reason): mod-max A for sit <> stand. Pt limited by dizziness and nausea with positional changes Toileting- Clothing Manipulation and Hygiene: Maximal assistance, +2 for safety/equipment, Sit to/from stand Toileting - Clothing Manipulation Details (indicate cue type and reason): assist for pericare in standing after BM Functional mobility during ADLs: Minimal assistance, +2 for safety/equipment, Rolling walker General ADL Comments: pt on bed pan that patient reports is extended time but had not called RN staff to help off pan. Only when therapist asked did patient indicate need to get off pan. Pt noted to have diarrhea void at this time with light brown color. Pt with skin break down at sacrum    Mobility  Overal bed mobility: Needs Assistance Bed Mobility: Supine to Sit Rolling: Min assist Sidelying to sit: Mod assist, +2 for safety/equipment, HOB elevated Supine to sit: Min assist, HOB elevated Sit to supine: Min guard General bed  mobility comments: Assist to elevate trunk into sitting    Transfers  Overall transfer level: Needs assistance Equipment used: Rolling walker (2 wheeled) Transfers: Sit to/from Stand, [employer] Transfers Sit to Stand: From elevated surface, Min assist Stand pivot transfers: Min assist  Lateral/Scoot Transfers: +2 physical assistance, Mod assist General transfer comment: Assist to bring hips up and for balance. Verbal cues for hand placement.    Ambulation / Gait / Stairs / Wheelchair Mobility  Ambulation/Gait Ambulation/Gait assistance: Herbalist (Feet): 45 Feet (15' x 1, 30' x 1, 45' x 1) Assistive device: Rolling walker (2 wheeled) Gait Pattern/deviations: Step-through pattern, Decreased stride length, Trunk flexed General Gait Details: Assist for balance and support. Pt fatigued quickly. Brought rollator behind pt for pt to sit for frequent rest breaks. Gait velocity: decr Gait velocity interpretation: <1.8 ft/sec, indicate of risk for recurrent falls    Posture / Balance Dynamic Sitting Balance Sitting balance - Comments: Pt tolerated sitting EOB x 5-10 minutes Balance Overall balance assessment: Needs assistance Sitting-balance support: Feet supported Sitting balance-Leahy Scale: Fair Sitting balance - Comments: Pt tolerated sitting EOB x 5-10 minutes Standing balance support: Bilateral upper extremity supported Standing balance-Leahy Scale: Poor Standing balance comment: walker and min guard for static standing    Special needs/care consideration Continuous Drip IV  N/A  Dialysis: Hemodialysis Tuesday, Thursday and Saturday (clip in progress with [place]-HD coordinator)  Oxygen: on RA  Skin: ecchymosis to abdomen, flank (right; lateral), MASD to coccyx, buttocks (mid), skin tear to right hand, abdominal incision, right chest incision, incision to back (right), incisions for ports (abdomen 1. right, lateral, mid; 2. Right, lateral, upper; 3. Right lateral  medial; 4. Right, lataral, lower); wound puncture neck right venous access puncture site -right IJ   Diabetic management: yes    and Designated visitor: [name] and [place] (from acute therapy documentation) Living Arrangements: Spouse/significant other Available Help at Discharge: Family, Available 24 hours/day Type of Home: House Home Layout: One level Home Access: Stairs to enter [employer] of Steps: 1 Bathroom Shower/Tub: Public librarian, Architectural technologist: Handicapped height Bathroom Accessibility: Yes (to RW, not w/c) Home Care Services: No  Discharge Living Setting Plans for Discharge Living Setting: Patient's home, Lives with (comment) (lives with wife) Type of Home at Discharge: [facility] Discharge  Home Layout: One level Discharge Home Access: Stairs to enter Entrance Stairs-Rails: None Entrance Stairs-Number of Steps: 1 Discharge Bathroom Shower/Tub: Tub/shower unit Discharge Bathroom Toilet: Handicapped height Discharge Bathroom Accessibility: Yes How Accessible: Accessible via walker Does the patient have any problems obtaining your medications?: No  Social/Family/Support Systems Patient Roles: Spouse Contact Information: [name] (wife): [phone] Anticipated Caregiver: wife + daughter can assist as well  Anticipated Caregiver's Contact Information: see above Ability/Limitations of Caregiver: Min A Caregiver Availability: 24/7 Discharge Plan Discussed with Primary Caregiver: Yes (pt, his wife, and daughter) Is Caregiver In Agreement with Plan?: Yes Does Caregiver/Family have Issues with Lodging/Transportation while Pt is in Rehab?: No  Goals Patient/Family Goal for Rehab: PT/OT: Mod I; SLP: NA Expected length of stay: 5-7 days Cultural Considerations: NA Pt/Family Agrees to Admission and willing to participate: Yes Program Orientation Provided & Reviewed with Pt/Caregiver Including Roles  & Responsibilities: Yes (pt,  his wife, and daughter)  Barriers to Discharge: Medical stability, Home environment access/layout, Hemodialysis, Nutrition means  Barriers to Discharge Comments: New HD-pt may need to have cortrack out prior to a bed offer for outpatient HD per HD coordinator [place].   Decrease burden of Care through IP rehab admission: NA  Possible need for SNF placement upon discharge: Not anticipated; pt has good social support from his family and his house appears to be accessible to a RW with minimal steps to enter. Anticipate pt can reach a Min A level to return home safely with family support.   Patient Condition: I have reviewed medical records from [facility], spoken with MD, and patient, spouse and daughter. I met with patient at the bedside for inpatient rehabilitation assessment.  Patient will benefit from ongoing PT, OT and SLP, can actively participate in 3 hours of therapy a day 5 days of the week, and can make measurable gains during the admission.  Patient will also benefit from the coordinated team approach during an Inpatient Acute Rehabilitation admission.  The patient will receive intensive therapy as well as Rehabilitation physician, nursing, social worker, and care management interventions.  Due to safety, skin/wound care, disease management, medication administration, pain management and patient education the patient requires 24 hour a day rehabilitation nursing.  The patient is currently Min G with mobility and Min A +2 for basic ADLs.  Discharge setting and therapy post discharge at home with home health is anticipated.  Patient has agreed to participate in the Acute Inpatient Rehabilitation Program and will admit today.  Preadmission Screen Completed By:  Retta Diones, 06/10/2020 10:38 AM ______________________________________________________________________   Discussed status with Dr. Naaman Plummer on 06/10/20 at 1042 and received approval for admission today.  Admission  Coordinator:  Retta Diones, RN, time 1042/Date 06/10/20   Assessment/Plan: Diagnosis: debility after multiple medical complications 1. Does the need for close, 24 hr/day Medical supervision in concert with the patient's rehab needs make it unreasonable for this patient to be served in a less intensive setting? Yes 2. Co-Morbidities requiring supervision/potential complications: right renal mass/prostate CA, retroperitoneal hemorrhage, RLL pneumonia, encephalopathy, CKD, DM, gout 3. Due to bladder management, bowel management, safety, skin/wound care, disease management, medication administration, pain management and patient education, does the patient require 24 hr/day rehab nursing? Yes 4. Does the patient require coordinated care of a physician, rehab nurse, PT, OT, and SLP to address physical and functional deficits in the context of the above medical diagnosis(es)? Yes Addressing deficits in the following areas: balance, endurance, locomotion, strength, transferring, bowel/bladder control,  bathing, dressing, feeding, grooming, toileting, cognition and psychosocial support 5. Can the patient actively participate in an intensive therapy program of at least 3 hrs of therapy 5 days a week? Yes 6. The potential for patient to make measurable gains while on inpatient rehab is excellent 7. Anticipated functional outcomes upon discharge from inpatient rehab: modified independent PT, modified independent OT, n/a SLP 8. Estimated rehab length of stay to reach the above functional goals is: 5-7 days 9. Anticipated discharge destination: Home 10. Overall Rehab/Functional Prognosis: excellent   MD Signature: Meredith Staggers, MD, [facility] 06/10/2020

## 2020-06-02 NOTE — Progress Notes (Signed)
  Speech Language Pathology Treatment: Dysphagia  Patient Details Name: Todd Robertson MRN: [id number] DOB: 11-27-1945 Today's Date: 06/02/2020 Time: 9718-2099 SLP Time Calculation (min) (ACUTE ONLY): 12 min  Assessment / Plan / Recommendation Clinical Impression  Pt not seen since last week due to ileus. Per RN/pt/family he has been eating little to no po's. His oral and pharyngeal swallow subjectively was unremarkable for aspiration. He does need rest breaks due to decrease endurance and for mild effortful breathing with multiple swallows. He did not need specific cues other than reminders to pace himself. Will upgrade texture to regular, continue thin, pills with thin. No further ST needed.    HPI HPI: 74 yo male former smoker presented on 7/09 for Rt nephrectomy in setting of renal cell carcinoma.  Post op developed hypotension. cough with water during [place] after extubation; 7/13-7/17.       SLP Plan  All goals met;Discharge SLP treatment due to (comment)       Recommendations  Diet recommendations: Regular;Thin liquid Liquids provided via: Cup;Straw Medication Administration: Whole meds with liquid Supervision: Patient able to self feed;Intermittent supervision to cue for compensatory strategies Compensations: Slow rate;Small sips/bites (rest breaks) Postural Changes and/or Swallow Maneuvers: Seated upright 90 degrees                Oral Care Recommendations: Oral care BID SLP Visit Diagnosis: Dysphagia, unspecified (R13.10) Plan: All goals met;Discharge SLP treatment due to (comment)       GO                [name] 06/02/2020, 4:09 PM

## 2020-06-02 NOTE — Progress Notes (Signed)
Occupational Therapy Treatment Patient Details Name: Todd Robertson MRN: [id number] DOB: December 22, 1945 Today's Date: 06/02/2020    History of present illness 74 yo male presenting 7/9 for planned right Nephrectomy. Went to the OR that day.  While in the PACU had declining w/ systolic BPs in 17E. Placed in Trendelenburg; IVF boluses administered. BP normalized but only made 52ml of urine. Large volume of hemorrhage in R nephrectomy, R retoperitoneum anerior ro the ilipsoas muslce and latral abdominal cavity.  Pt is dialysis dependent and presumed septic shock with unknown etiology noted in chart. PMH  h/o prostate CA, DM2, gout, HTN   OT comments  Pt noted to have decrease BP with eob sitting and lack of awareness or verbalizing any symptoms. Pt very flat affect and minimal verbal responses during session. Pt noted to have sacral wound with barrier cream and sacral pad applied this session. ( RN made aware). Pt with diarrhea noted in bed pan on arrival. Pt requires max cues for bed mobility but able to initiate and demonstrates bridge this session for pad placement.    Follow Up Recommendations  CIR    Equipment Recommendations  3 in 1 bedside commode    Recommendations for Other Services      Precautions / Restrictions Precautions Precautions: Fall;Other (comment) Precaution Comments: watch vitals, NG tube, cortrak       Mobility Bed Mobility Overal bed mobility: Needs Assistance Bed Mobility: Rolling;Sidelying to Sit;Sit to Supine Rolling: Min assist Sidelying to sit: Mod assist;+2 for safety/equipment;HOB elevated   Sit to supine: +2 for physical assistance;Min assist   General bed mobility comments: +rail, cues for sequencing, increased time  Transfers Overall transfer level: Needs assistance Equipment used: None Transfers: Lateral/Scoot Transfers          Lateral/Scoot Transfers: +2 physical assistance;Mod assist General transfer comment: lateral scooting along EOB.  Unable to progress to standing due to orthostatic hypotension.    Balance Overall balance assessment: Needs assistance Sitting-balance support: Feet supported;Single extremity supported Sitting balance-Leahy Scale: Fair Sitting balance - Comments: Pt tolerated sitting EOB x 5-10 minutes                                   ADL either performed or assessed with clinical judgement   ADL Overall ADL's : Needs assistance/impaired Eating/Feeding: NPO   Grooming: Minimal assistance;Bed level       Lower Body Bathing: Total assistance Lower Body Bathing Details (indicate cue type and reason): peri care during session due to void.                        General ADL Comments: pt on bed pan that patient reports is extended time but had not called RN staff to help off pan. Only when therapist asked did patient indicate need to get off pan. Pt noted to have diarrhea void at this time with light brown color. Pt with skin break down at sacrum     Vision       Perception     Praxis      Cognition Arousal/Alertness: Awake/alert Behavior During Therapy: Flat affect;WFL for tasks assessed/performed Overall Cognitive Status: Difficult to assess                                 General Comments: minimal interaction. Appropriate and following comands.  Poor communication relating to symptomatic orthostatic hypotension.        Exercises General Exercises - Lower Extremity Ankle Circles/Pumps: AROM;20 reps;Seated;Both   Shoulder Instructions       General Comments BP 125/66 supine prior to mobility. BP 103/77 after sitting EOB. BP 91/71 after 2-minute rest in supine at end of session.    Pertinent Vitals/ Pain       Pain Assessment: Faces Faces Pain Scale: Hurts a little bit Pain Location: generalized Pain Descriptors / Indicators: Discomfort Pain Intervention(s): Monitored during session;Repositioned  Home Living                                           Prior Functioning/Environment              Frequency  Min 2X/week        Progress Toward Goals  OT Goals(current goals can now be found in the care plan section)  Progress towards OT goals: Progressing toward goals  Acute Rehab OT Goals Patient Stated Goal: independence OT Goal Formulation: With patient/family Time For Goal Achievement: 06/09/20 Potential to Achieve Goals: Good ADL Goals Pt Will Perform Grooming: with min assist;standing Pt Will Perform Lower Body Dressing: with min assist;sit to/from stand Pt Will Transfer to Toilet: with min assist;ambulating Additional ADL Goal #1: Pt will complete bed mobility with superivsion in preparation for ADL.  Plan Discharge plan remains appropriate    Co-evaluation    PT/OT/SLP Co-Evaluation/Treatment: Yes Reason for Co-Treatment: Complexity of the patient's impairments (multi-system involvement);Necessary to address cognition/behavior during functional activity;For patient/therapist safety PT goals addressed during session: Mobility/safety with mobility;Balance OT goals addressed during session: ADL's and self-care;Strengthening/ROM      AM-PAC OT "6 Clicks" Daily Activity     Outcome Measure   Help from another person eating meals?: A Little Help from another person taking care of personal grooming?: A Little Help from another person toileting, which includes using toliet, bedpan, or urinal?: A Lot Help from another person bathing (including washing, rinsing, drying)?: A Lot Help from another person to put on and taking off regular upper body clothing?: A Little Help from another person to put on and taking off regular lower body clothing?: A Lot 6 Click Score: 15    End of Session    OT Visit Diagnosis: Unsteadiness on feet (R26.81);Other abnormalities of gait and mobility (R26.89);Muscle weakness (generalized) (M62.81);Feeding difficulties (R63.3);Pain   Activity Tolerance Patient  tolerated treatment well   Patient Left in bed;with call bell/phone within reach   Nurse Communication Mobility status;Precautions        Time: 7116-5790 OT Time Calculation (min): 33 min  Charges: OT General Charges $OT Visit: 1 Visit OT Treatments $Self Care/Home Management : 8-22 mins   Brynn, OTR/L  Acute Rehabilitation Services Pager: [phone] Office: [phone] .    Jeri Modena 06/02/2020, 10:53 AM

## 2020-06-02 NOTE — Progress Notes (Signed)
[facility] Progress Note  Interval events/Subjective:   Unable to run CRRT consistently, clotting/pulling air  Did get tPA of HD cath  K 4.4, P 2.3  On low dose PE  Working with PT   Vitals:   06/02/20 0600 06/02/20 0615 06/02/20 0700 06/02/20 0800  BP: (!) 88/54 (!) 93/52 (!) 111/58 (!) 104/87  Pulse: 104 104 96 103  Resp: (!) 29 (!) 29 (!) 26 (!) 26  Temp:      TempSrc:      SpO2: 96% 95% 100% 99%  Weight:      Height:        Exam: Gen: nad Heent: +ngt CV: rrr, s1s2, no m/r/g Resp: cta bl, no overt w/r/r/c, normal wob Abd: soft, distension significantly improved Ext: no edema Neuro: awake, following commands, speech clear and coherent Rt temp hd cath (trialysis)    Home meds:  - asa 100m/ lipitor 40 hs/ benazepril 20 am/ bumex 142mqd/ coreg 25 bid  - allopurinol 300 hs  - symbicort ACT 2 puff bid  - prn's/ vitamins/ supplements  Date                             Creat               egFR   2018                          1.79- 2.38        30- 43   2019                          1.80- 1.81        42-43   January 12, 2020           3.00                    May 13, 2020                2.60                 27   Jul 9                           2.51                 29- 33   Jul 10                         3.42, 3.89    UA none yet   CT abd wo contrast 7/9 - Adrenals/Urinary Tract: Post right nephrectomy. Large volume of hemorrhage in the right nephrectomy bed. Ill-defined air fluid collection tracks in the right retroperitoneum anterior to the iliopsoas muscle and laterally in the abdominal cavity. Hemorrhage tracks into the right aspect of the pelvis in the is only partially included in the field of view. There is moderate subcutaneous emphysema involving the right abdominal wall. Small foci of air dissects between the right abdominal wall musculature. Simple cyst in the left kidney. There is no left hydronephrosis.  CT abd w/ contrast 05/28/2020 1.  Slight decrease in size of right retroperitoneal hematoma following right nephrectomy. 2. No enhancing abscess is present. 3. Improved subcutaneous gas over the right side of the abdomen. 4. Improved bibasilar airspace  disease and effusions, right greater than left. 5. Stable moderate dilation of the transverse colon. 6. Stable dilation of the stomach despite OG tube in place 7. Multilevel degenerative changes in the lumbar spine. 8. Aortic Atherosclerosis (ICD10-I70.0).        Recent Labs  Lab 06/01/20 0232 06/01/20 1157 06/01/20 1545 06/02/20 0153  K   < >  --  4.5 4.4  BUN   < >  --  25* 24*  CREATININE   < >  --  3.40* 3.26*  CALCIUM   < >  --  8.2* 8.7*  PHOS   < >  --  2.3* 2.3*  HGB  --  8.7*  --  8.6*   < > = values in this interval not displayed.   Inpatient medications: . arformoterol  15 mcg Nebulization BID  . B-complex with vitamin C  1 tablet Oral Daily  . budesonide (PULMICORT) nebulizer solution  0.5 mg Nebulization BID  . chlorhexidine gluconate (MEDLINE KIT)  15 mL Mouth Rinse BID  . Chlorhexidine Gluconate Cloth  6 each Topical Daily  . darbepoetin (ARANESP) injection - NON-DIALYSIS  60 mcg Subcutaneous Q Sat-1800  . feeding supplement (PROSource TF)  90 mL Per Tube BID  . insulin aspart  0-9 Units Subcutaneous Q4H  . insulin detemir  7 Units Subcutaneous QHS  . mouth rinse  15 mL Mouth Rinse q12n4p  . melatonin  5 mg Per Tube QHS  . metoCLOPramide  5 mg Per Tube TID AC  . midodrine  15 mg Oral TID WC  . pantoprazole (PROTONIX) IV  40 mg Intravenous Q12H  . polyethylene glycol  17 g Per Tube Daily  . sennosides  5 mL Per Tube BID  . sodium chloride flush  10-40 mL Intracatheter Q12H   .  prismasol BGK 4/2.5 400 mL/hr at 06/02/20 0021  . sodium chloride Stopped (05/27/20 0829)  . sodium chloride    . feeding supplement (VITAL 1.5 CAL) 1,000 mL (06/01/20 1222)  . phenylephrine (NEO-SYNEPHRINE) Adult infusion 40 mcg/min (06/02/20 0800)  .  prismasol BGK 2/2.5 replacement solution 300 mL/hr at 06/02/20 0146  . prismasol BGK 4/2.5 1,800 mL/hr at 06/02/20 0106   sodium chloride, acetaminophen **OR** acetaminophen, albuterol, alum & mag hydroxide-simeth, diphenhydrAMINE **OR** diphenhydrAMINE, heparin, menthol-cetylpyridinium, ondansetron (ZOFRAN) IV, phenol, promethazine, Resource ThickenUp Clear, sodium chloride flush, tiZANidine   Assessment/ Plan: 1. Dialysis dependent AoCKD IV - b/l creat 2.60, eGFR 27 then s/p nephrectomy with resultant progressive renal insufficiency and oliguria.  HD catheter placed IR 7/13 - not functional so replaced bedside 7/15 by PCCM.  Off CRRT 7/18, restarted 7/20-7/26. 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  Will need transition to tunneled cath at some point especially once more stable  Hold CRRT, try to ween pressors today  Reassess tomorrow, possibly for iHD  Check pm Labs 2. Renal mass - sp R nephrectomy 05/16/20; neg margins on path 3. Anemia ckd / abl - hgb stable today, transfusion/mgmt per primary team. CT 7/9 with hemorrhage in nephrectomy bed - Transfuse per primary. Iron indices show tsat 10%.   Margins were negative, started ESA: aranesp 100 qSat.   Was holding IV iron in setting of septic shock. Started IV Fe load -- TSAT 10% 7/13. Hb stable 8s 4. Shock, presumed septic? Etiology currently unknown- requiring phenylephrine, continue with midodrine via ngt 5. Hyponatremia - resolved 6. Hyperkalemia - resolved, 7. Leukocytosis: persistent, afebrile  Rexene Agent , MD [employer]

## 2020-06-02 NOTE — Progress Notes (Signed)
Nutrition Follow-up  DOCUMENTATION CODES:   Non-severe (moderate) malnutrition in context of acute illness/injury  INTERVENTION:   - If pt unable to tolerate tube feeds, recommend considering TPN  Tube feeds via post-pyloric Cortrak: - Start with Vital 1.5 @ 20 ml/hr and increase rate by 10 ml q 6 hours until goal of 60 ml/hr is reached (1440 ml/day) - ProSource TF 90 ml BID  Tube feeding regimen at goal provides 2320 kcal, 141 grams of protein, and 1100 ml of H2O.  NUTRITION DIAGNOSIS:   Moderate Malnutrition related to acute illness (renal faiure requiring CRRT) as evidenced by mild muscle depletion, energy intake < or equal to 50% for > or equal to 5 days.  New diagnosis after completion of NFPE  GOAL:   Patient will meet greater than or equal to 90% of their needs  Progressing  MONITOR:   PO intake, Labs, Weight trends, TF tolerance, Skin, I & O's  REASON FOR ASSESSMENT:   Consult Enteral/tube feeding initiation and management  ASSESSMENT:   74 yo male admitted 7/9 S/P right nephrectomy for large mass. Transferred to the ICU and required intubation on 7/13 due to shock and worsening respiratory failure. PMH includes HTN, cardiomyopathy, DM2, prostate CA, CKD, HLD, gout.  7/14 - CRRT initiated, TF initiated 7/17 - extubated 7/18 - CRRT d/c 7/19 - Cortrak placed, tip gastric per Cortrak team 7/20 - CRRT restarted, s/p FEES and diet advanced to dysphagia 2 with nectar-thick liquids 7/22 - diet advanced to dysphagia 3 with thin liquids 7/23 - NG tube placed for decompression, Cortrak advanced post-pyloric, tube feeds restarted at trickle 7/26 - CRRT held  Per Nephrology note, holding CRRT and trying to wean pressors today. Possible iHD tomorrow.  Spoke with CCM who approved titrating tube feeds to goal. Discussed with RN. NG tube removed today. However, pt still with significant output overnight.  Pt has been without adequate nutrition since 05/26/20. Pt now meets  criteria for acute malnutrition.  Spoke with pt at bedside. He reports that his stomach feels fine today. Pt states that he has not been eating.  Weight down a total of 15.2 kg since admit. EDW: 106.4 kg (lowest weight).  Current TF: Vital 1.5 @ 20 ml/hr, ProSource TF 90 ml BID  Medications reviewed and include: B-complex with vitamin C, aranesp, solu-cortef, SSI q 4 hours, Levemir 7 units daily, protonix, miralax, senokot, phenylephrine  Labs reviewed: phosphorus 2.3, magnesium 2.6, hemoglobin 8.6 CBG's: 139-178 x 24 hours  NG tube: 1950 ml x 24 hours CRRT net UF: 487 ml x 24 hours I/O's: -4.5 L since admit  NUTRITION - FOCUSED PHYSICAL EXAM:    Most Recent Value  Orbital Region No depletion  Upper Arm Region Mild depletion  Thoracic and Lumbar Region No depletion  Buccal Region No depletion  Temple Region No depletion  Clavicle Bone Region Mild depletion  Clavicle and Acromion Bone Region Mild depletion  Scapular Bone Region Unable to assess  Dorsal Hand Mild depletion  Patellar Region Mild depletion  Anterior Thigh Region Mild depletion  Posterior Calf Region Mild depletion  Edema (RD Assessment) Mild  [BUE, BLE]  Hair Reviewed  Eyes Reviewed  Mouth Reviewed  Skin Reviewed  Nails Reviewed       Diet Order:   Diet Order            DIET DYS 3 Room service appropriate? Yes with Assist; Fluid consistency: Thin  Diet effective now  EDUCATION NEEDS:   Not appropriate for education at this time  Skin:  Skin Assessment: Skin Integrity Issues: Incisions: closed abdomen Other: puncture wound to neck (right IJ)  Last BM:  06/02/20 type 7  Height:   Ht Readings from Last 1 Encounters:  05/16/20 6\' 2"  (1.88 m)    Weight:   Wt Readings from Last 1 Encounters:  06/02/20 (!) 111.4 kg    Ideal Body Weight:  86.4 kg  BMI:  Body mass index is 31.53 kg/m.  Estimated Nutritional Needs:   Kcal:  4832-3468  Protein:  120-140  grams  Fluid:  1 L + UOP    Gaynell Face, MS, RD, LDN Inpatient Clinical Dietitian Please see AMiON for contact information.

## 2020-06-02 NOTE — Progress Notes (Signed)
17 Days Post-Op   Subjective/Chief Complaint:  Tmax 101.8 today. Weaning phenylephrine NGT in place with 1.950cc output past 24 hours. Trickle feeds via corpak. 487cc off from CRRT -- issues with clotting of line Feels much better and more alert today  Objective: Vital signs in last 24 hours: Temp:  [99.2 F (37.3 C)-101.8 F (38.8 C)] 100.7 F (38.2 C) (07/26 1300) Pulse Rate:  [89-121] 102 (07/26 1500) Resp:  [16-37] 37 (07/26 1500) BP: (83-148)/(46-95) 122/68 (07/26 1500) SpO2:  [94 %-100 %] 96 % (07/26 1500) FiO2 (%):  [21 %] 21 % (07/25 2041) Weight:  [111.4 kg] 111.4 kg (07/26 0500) Last BM Date: 06/02/20  Intake/Output from previous day: 07/25 0701 - 07/26 0700 In: 899 [P.O.:240; I.V.:209; NG/GT:450] Out: 2438 [Emesis/NG output:1950; Stool:1] Intake/Output this shift: Total I/O In: 106.4 [I.V.:6.4; NG/GT:100] Out: 0   General appearance: Awake, alert  Resp: breathing comfortably on room air Cardio: HR 70s on monitor, regular.  Extremities: edema improving Abd: Obese, soft, much less distended.   Lab Results:  Recent Labs    06/01/20 1157 06/02/20 0153  WBC 17.5* 14.1*  HGB 8.7* 8.6*  HCT 29.1* 28.5*  PLT 214 186   BMET Recent Labs    06/01/20 1545 06/02/20 0153  NA 137 139  K 4.5 4.4  CL 104 102  CO2 24 26  GLUCOSE 173* 150*  BUN 25* 24*  CREATININE 3.40* 3.26*  CALCIUM 8.2* 8.7*   PT/INR Recent Labs    06/01/20 1157  LABPROT 13.8  INR 1.1   ABG No results for input(s): PHART, HCO3 in the last 72 hours.  Invalid input(s): PCO2, PO2  Studies/Results: No results found.  Anti-infectives: Anti-infectives (From admission, onward)   Start     Dose/Rate Route Frequency Ordered Stop   05/27/20 1530  ceFEPIme (MAXIPIME) 2 g in sodium chloride 0.9 % 100 mL IVPB        2 g 200 mL/hr over 30 Minutes Intravenous  Once 05/27/20 1140 05/27/20 1851   05/26/20 2200  ceFEPIme (MAXIPIME) 1 g in sodium chloride 0.9 % 100 mL IVPB  Status:   Discontinued        1 g 200 mL/hr over 30 Minutes Intravenous Every 24 hours 05/25/20 1342 05/27/20 1140   05/22/20 1200  vancomycin (VANCOREADY) IVPB 1250 mg/250 mL  Status:  Discontinued        1,250 mg 166.7 mL/hr over 90 Minutes Intravenous Every 24 hours 05/21/20 1347 05/24/20 0855   05/22/20 0400  vancomycin (VANCOREADY) IVPB 1250 mg/250 mL  Status:  Discontinued        1,250 mg 166.7 mL/hr over 90 Minutes Intravenous Every 24 hours 05/21/20 0218 05/21/20 1201   05/21/20 1201  vancomycin variable dose per unstable renal function (pharmacist dosing)  Status:  Discontinued         Does not apply See admin instructions 05/21/20 1201 05/22/20 0708   05/21/20 0315  vancomycin (VANCOCIN) 2,500 mg in sodium chloride 0.9 % 500 mL IVPB        2,500 mg 250 mL/hr over 120 Minutes Intravenous  Once 05/21/20 0218 05/21/20 0540   05/21/20 0315  ceFEPIme (MAXIPIME) 2 g in sodium chloride 0.9 % 100 mL IVPB  Status:  Discontinued        2 g 200 mL/hr over 30 Minutes Intravenous Every 12 hours 05/21/20 0218 05/25/20 1342   05/16/20 1700  ceFAZolin (ANCEF) IVPB 1 g/50 mL premix        1 g  100 mL/hr over 30 Minutes Intravenous Every 8 hours 05/16/20 1600 05/17/20 0206   05/16/20 0600  ceFAZolin (ANCEF) 3 g in dextrose 5 % 50 mL IVPB        3 g 100 mL/hr over 30 Minutes Intravenous 30 min pre-op 05/15/20 0725 05/16/20 1944      Assessment/Plan:  1 - RIGHT Renal Neoplasm - s/p nephrectomy. no further cancer directed therapy this admission.    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 Appreciate recommendations of Nephrology regarding long term dialysis plan  3 - Uremic encephalopathy / ICU delirium - resolved  4 - Fever / PNA / leukocytosis - Leukocytosis resolving, but patient still intermittently febrile.  cefepime discontinued. No evidence of intra-abdominal infection on CT scan, and improving PNA. Blood  cultures negative. Low concern for active infection.   5- Acute Blood Loss Anemia - No evidence of active post operative bleeding.   Agree with PRN transfusion if needed from a cardiovascular/hemodynamic standpoint. Stable from a urologic standpoint to continue DVT ppx.  6 - Constipation/Ileus - Now with post pyloric corpak for trickle feeds, NGT to LIWS  7 - Hypotension - continues to require pressor support though decreasing, possibly secondary to volume loss from CRRT. Remains on midodrine, weaning pressors   Carmie Kanner 06/02/2020

## 2020-06-02 NOTE — Progress Notes (Signed)
eLink Physician-Brief Progress Note Patient Name: Todd Robertson DOB: 17-Nov-1945 MRN: [id number]   Date of Service  06/02/2020  HPI/Events of Note  Request for IV team to declot dialysis access.   eICU Interventions  Plan: 1. IV Team consult to declot dialysis access.      Intervention Category Major Interventions: Other:  [name] [name] 06/02/2020, 3:30 AM

## 2020-06-02 NOTE — Progress Notes (Signed)
Inpatient Rehabilitation-Admissions Coordinator   [facility] continues to follow for medical readiness. Pt will need to be off CRRT and tolerate iHD. Therapy continues to recommend CIR.   Raechel Ache, OTR/L  Rehab Admissions Coordinator  [phone] 06/02/2020 10:25 AM

## 2020-06-02 NOTE — Progress Notes (Signed)
Physical Therapy Treatment Patient Details Name: Todd Robertson MRN: [id number] DOB: 08/15/1946 Today's Date: 06/02/2020    History of Present Illness (P) 74 yo male presenting 7/9 for planned right Nephrectomy. Went to the OR that day.  While in the PACU had declining w/ systolic BPs in 29B. Placed in Trendelenburg; IVF boluses administered. BP normalized but only made 81ml of urine. Large volume of hemorrhage in R nephrectomy, R retoperitoneum anerior ro the ilipsoas muslce and latral abdominal cavity.  Pt is dialysis dependent and presumed septic shock with unknown etiology noted in chart. PMH  h/o prostate CA, DM2, gout, HTN    PT Comments    Pt received in bed, agreeable to participation in therapy. Pt currently off CRRT due to complications with equipment overnight. Plan is for possible transition to [facility] tomorrow. Pt required min assist rolling, +1-2 min/mod assist supine <> sit, and +2 mod assist lateral scooting EOB. Pt tolerated sitting EOB x 5-10 minutes. Unable to progress to standing due to orthostatic hypotension. BP 125/66 supine prior to mobility. BP 91/71 supine after mobility. Max HR 127 and max RR 40 during session. RN notified. Pt supine in bed at end of session.    Follow Up Recommendations  CIR     Equipment Recommendations  Other (comment) (TBA)    Recommendations for Other Services       Precautions / Restrictions Precautions Precautions: (P) Fall;Other (comment) Precaution Comments: (P) watch vitals, NG tube, cortrak    Mobility  Bed Mobility Overal bed mobility: Needs Assistance Bed Mobility: Rolling;Sidelying to Sit;Sit to Supine Rolling: Min assist Sidelying to sit: Mod assist;+2 for safety/equipment;HOB elevated   Sit to supine: +2 for physical assistance;Min assist   General bed mobility comments: +rail, cues for sequencing, increased time  Transfers Overall transfer level: Needs assistance Equipment used: None Transfers: Lateral/Scoot  Transfers          Lateral/Scoot Transfers: +2 physical assistance;Mod assist General transfer comment: lateral scooting along EOB. Unable to progress to standing due to orthostatic hypotension.  Ambulation/Gait                 Stairs             Wheelchair Mobility    Modified Rankin (Stroke Patients Only)       Balance Overall balance assessment: Needs assistance Sitting-balance support: Feet supported;Single extremity supported Sitting balance-Leahy Scale: Fair Sitting balance - Comments: Pt tolerated sitting EOB x 5-10 minutes                                    Cognition Arousal/Alertness: (P) Awake/alert Behavior During Therapy: (P) Flat affect;WFL for tasks assessed/performed Overall Cognitive Status: (P) Difficult to assess                                 General Comments: (P) minimal interaction. Appropriate and following comands. Poor communication relating to symptomatic orthostatic hypotension.      Exercises General Exercises - Lower Extremity Ankle Circles/Pumps: AROM;20 reps;Seated;Both    General Comments General comments (skin integrity, edema, etc.): BP 125/66 supine prior to mobility. BP 103/77 after sitting EOB. BP 91/71 after 2-minute rest in supine at end of session.      Pertinent Vitals/Pain Pain Assessment: (P) Faces Faces Pain Scale: (P) Hurts a little bit Pain Location: (P) generalized Pain Descriptors /  Indicators: (P) Discomfort Pain Intervention(s): (P) Monitored during session;Repositioned    Home Living                      Prior Function            PT Goals (current goals can now be found in the care plan section) Acute Rehab PT Goals Patient Stated Goal: independence Progress towards PT goals: Progressing toward goals    Frequency    Min 3X/week      PT Plan Current plan remains appropriate    Co-evaluation PT/OT/SLP Co-Evaluation/Treatment: Yes Reason for  Co-Treatment: Complexity of the patient's impairments (multi-system involvement);Necessary to address cognition/behavior during functional activity;For patient/therapist safety PT goals addressed during session: Mobility/safety with mobility;Balance OT goals addressed during session: ADL's and self-care;Strengthening/ROM      AM-PAC PT "6 Clicks" Mobility   Outcome Measure  Help needed turning from your back to your side while in a flat bed without using bedrails?: A Little Help needed moving from lying on your back to sitting on the side of a flat bed without using bedrails?: A Little Help needed moving to and from a bed to a chair (including a wheelchair)?: A Lot Help needed standing up from a chair using your arms (e.g., wheelchair or bedside chair)?: A Little Help needed to walk in hospital room?: A Lot Help needed climbing 3-5 steps with a railing? : Total 6 Click Score: 14    End of Session   Activity Tolerance: Patient tolerated treatment well Patient left: in bed;with call bell/phone within reach Nurse Communication: Mobility status PT Visit Diagnosis: Other abnormalities of gait and mobility (R26.89);Unsteadiness on feet (R26.81)     Time: 7858-8502 PT Time Calculation (min) (ACUTE ONLY): 31 min  Charges:  $Therapeutic Activity: 8-22 mins                     Lorrin Goodell, PT  Office # [phone] Pager [phone]    Lorriane Shire 06/02/2020, 9:44 AM

## 2020-06-02 NOTE — Progress Notes (Signed)
NAME:  Todd Robertson, MRN:  [id number], DOB:  Jul 13, 1946, LOS: 67 ADMISSION DATE:  05/16/2020, CONSULTATION DATE:  05/16/2020 REFERRING MD:  Dr Alyson Ingles, CHIEF COMPLAINT:  Shock   Brief History   74 yo male former smoker presented on 7/09 for Rt nephrectomy in setting of renal cell carcinoma.  Post op developed hypotension and PCCM consulted.  Past Medical History  OA, Prostate cancer, Cardiomyopathy (? 2018 LVEF 55-60%), CKD, DM 2, Gout, HTN, Asthma  Significant Hospital Events   7/09 Rt nephrectomy 7/13 Rt IJ HD catheter by IR; worsening hypercapnia and AMS >> intubated, start ABx for HCAP 7/14 start CRRT 7/18 off CRRT 7/20 back on CRRT 7/26 off CRRT, remains on pressors  Consults:  Nephrology Urology  Procedures:  Rt IJ HD catheter 7/13 >> ETT 7/13 >> 7/17 Rt radial a line 7/15 > 7/18  Significant Diagnostic Tests:   CT abd/pelvis 7/09 >> s/p Rt nephrectomy.  Large volume of Rt retroperitoneal hemorrhage, moderate subcutaneous emphysema Rt abdominal wall  CT abd/pelvis 7/15 >> decreasing postoperative hemorrhage and subcutaneous emphysema, patchy consolidation b/l  Echo 7/21: LVEF 45-50% with global hypokinesis, mildly dilated RV with normal function. Mildly dilated RA. Mildly dilated ascending aorta.  LE [place] > no DVTs  Micro Data:  MRSA screen 7/09 >> negative Sputum 7/14 >> normal flora Blood 7/14 >>negative Blood 7/20>> negative  Antimicrobials:  Cefepime 7/13 >> 7/20 Vancomycin 7/13 >> 7/17  Interim history/subjective:  Remains on pressors.  C/o sore throat.  Objective   Blood pressure (!) 104/87, pulse 103, temperature 99.9 F (37.7 C), temperature source Oral, resp. rate (!) 26, height 6\' 2"  (1.88 m), weight (!) 111.4 kg, SpO2 99 %.    FiO2 (%):  [21 %] 21 %   Intake/Output Summary (Last 24 hours) at 06/02/2020 6578 Last data filed at 06/02/2020 4696 Gross per 24 hour  Intake 887.41 ml  Output 2369 ml  Net -1481.59 ml   Filed Weights   05/31/20  0300 06/01/20 0500 06/02/20 0500  Weight: (!) 110.3 kg (!) 106.4 kg (!) 111.4 kg    Examination:  General - alert Eyes - pupils reactive ENT - no sinus tenderness, no stridor Cardiac - regular rate/rhythm, no murmur Chest - equal breath sounds b/l, no wheezing or rales Abdomen - soft, non tender, + bowel sounds Extremities - no cyanosis, clubbing, or edema Skin - no rashes Neuro - follows commands   Resolved Hospital Problem list   Septic shock, Acute metabolic encephalopathy 2nd to hypercapnia and renal failure, Acute hypoxic/hypercapnic respiratory failure  Assessment & Plan:   Hypotension in setting of ESRD. - goal SBP > 90 - continue midodrine - add solu cortef  Hx of asthma. - continue pulmicort, brovana with prn albuterol  ESRD. - off CRRT 7/26 - will likely need transition to tunneled catheter at some point  S/p Rt nephrectomy for renal cell carcinoma. - post op care per urology  Anemia from acute blood loss after surgery and chronic disease.   - f/u CBC - transfuse for Hb < 7 or significant bleeding  DM type 2, hyperglycemia controlled  - SSI with levemir  Chronic diastolic CHF, HLD. - monitor hemodynamics  Deconditioning -Continue PT and OT    Best practice:  Diet: tube feeds DVT prophylaxis: SCDs GI prophylaxis: protonix Mobility: PT/OT Code Status: full Disposition: ICU  Labs    CMP Latest Ref Rng & Units 06/02/2020 06/01/2020 06/01/2020  Glucose 70 - 99 mg/dL 150(H) 173(H) 157(H)  BUN  8 - 23 mg/dL 24(H) 25(H) 27(H)  Creatinine 0.61 - 1.24 mg/dL 3.26(H) 3.40(H) 3.18(H)  Sodium 135 - 145 mmol/L 139 137 140  Potassium 3.5 - 5.1 mmol/L 4.4 4.5 4.7  Chloride 98 - 111 mmol/L 102 104 103  CO2 22 - 32 mmol/L 26 24 26   Calcium 8.9 - 10.3 mg/dL 8.7(L) 8.2(L) 8.2(L)  Total Protein 6.5 - 8.1 g/dL - - -  Total Bilirubin 0.3 - 1.2 mg/dL - - -  Alkaline Phos 38 - 126 U/L - - -  AST 15 - 41 U/L - - -  ALT 0 - 44 U/L - - -    CBC Latest Ref Rng &  Units 06/02/2020 06/01/2020 06/01/2020  WBC 4.0 - 10.5 K/uL 14.1(H) 17.5(H) 17.7(H)  Hemoglobin 13.0 - 17.0 g/dL 8.6(L) 8.7(L) 8.3(L)  Hematocrit 39 - 52 % 28.5(L) 29.1(L) 27.3(L)  Platelets 150 - 400 K/uL 186 214 225    ABG    Component Value Date/Time   PHART 7.409 05/23/2020 0400   PCO2ART 38.5 05/23/2020 0400   PO2ART 85 05/23/2020 0400   HCO3 24.4 05/23/2020 0400   TCO2 26 05/23/2020 0400   ACIDBASEDEF 1.0 05/20/2020 2308   O2SAT 97.0 05/23/2020 0400    CBG (last 3)  Recent Labs    06/01/20 2333 06/02/20 0333 06/02/20 0715  GLUCAP 162* 144* 139*   Signature:  Chesley Mires, MD [place] Pager - [phone] 06/02/2020, 9:37 AM

## 2020-06-03 LAB — CBC
HCT: 22.6 % — ABNORMAL LOW (ref 39.0–52.0)
Hemoglobin: 7 g/dL — ABNORMAL LOW (ref 13.0–17.0)
MCH: 29.2 pg (ref 26.0–34.0)
MCHC: 31 g/dL (ref 30.0–36.0)
MCV: 94.2 fL (ref 80.0–100.0)
Platelets: 243 10*3/uL (ref 150–400)
RBC: 2.4 MIL/uL — ABNORMAL LOW (ref 4.22–5.81)
RDW: 17.8 % — ABNORMAL HIGH (ref 11.5–15.5)
WBC: 14.3 10*3/uL — ABNORMAL HIGH (ref 4.0–10.5)
nRBC: 0 % (ref 0.0–0.2)

## 2020-06-03 LAB — GLUCOSE, CAPILLARY
Glucose-Capillary: 229 mg/dL — ABNORMAL HIGH (ref 70–99)
Glucose-Capillary: 232 mg/dL — ABNORMAL HIGH (ref 70–99)
Glucose-Capillary: 265 mg/dL — ABNORMAL HIGH (ref 70–99)
Glucose-Capillary: 271 mg/dL — ABNORMAL HIGH (ref 70–99)
Glucose-Capillary: 290 mg/dL — ABNORMAL HIGH (ref 70–99)
Glucose-Capillary: 293 mg/dL — ABNORMAL HIGH (ref 70–99)

## 2020-06-03 LAB — RENAL FUNCTION PANEL
Albumin: 2.5 g/dL — ABNORMAL LOW (ref 3.5–5.0)
Anion gap: 11 (ref 5–15)
BUN: 49 mg/dL — ABNORMAL HIGH (ref 8–23)
CO2: 22 mmol/L (ref 22–32)
Calcium: 8.3 mg/dL — ABNORMAL LOW (ref 8.9–10.3)
Chloride: 102 mmol/L (ref 98–111)
Creatinine, Ser: 6.46 mg/dL — ABNORMAL HIGH (ref 0.61–1.24)
GFR calc Af Amer: 9 mL/min — ABNORMAL LOW (ref >60)
GFR calc non Af Amer: 8 mL/min — ABNORMAL LOW (ref >60)
Glucose, Bld: 240 mg/dL — ABNORMAL HIGH (ref 70–99)
Phosphorus: 3.4 mg/dL (ref 2.5–4.6)
Potassium: 4.6 mmol/L (ref 3.5–5.1)
Sodium: 135 mmol/L (ref 135–145)

## 2020-06-03 LAB — MAGNESIUM: Magnesium: 2.6 mg/dL — ABNORMAL HIGH (ref 1.7–2.4)

## 2020-06-03 MED ORDER — VITAL 1.5 CAL PO LIQD
1000.0000 mL | ORAL | Status: DC
  Administered 2020-06-03 – 2020-06-04 (×2): 1000 mL
  Filled 2020-06-03 (×4): qty 1000

## 2020-06-03 MED ORDER — INSULIN DETEMIR 100 UNIT/ML ~~LOC~~ SOLN
12.0000 [IU] | Freq: Every day | SUBCUTANEOUS | Status: DC
  Administered 2020-06-03 – 2020-06-05 (×3): 12 [IU] via SUBCUTANEOUS
  Filled 2020-06-03 (×6): qty 0.12

## 2020-06-03 NOTE — Progress Notes (Signed)
NAME:  Todd Robertson, MRN:  [id number], DOB:  04/30/1946, LOS: 47 ADMISSION DATE:  05/16/2020, CONSULTATION DATE:  05/16/2020 REFERRING MD:  Dr Alyson Ingles, CHIEF COMPLAINT:  Shock   Brief History   74 yo male former smoker presented on 7/09 for Rt nephrectomy in setting of renal cell carcinoma.  Post op developed hypotension and PCCM consulted.  Past Medical History  OA, Prostate cancer, Cardiomyopathy (? 2018 LVEF 55-60%), CKD, DM 2, Gout, HTN, Asthma  Significant Hospital Events   7/09 Rt nephrectomy 7/13 Rt IJ HD catheter by IR; worsening hypercapnia and AMS >> intubated, start ABx for HCAP 7/14 start CRRT 7/18 off CRRT 7/20 back on CRRT 7/26 off CRRT, remains on pressors 7/27 off pressors, transfer to progressive care  Consults:  Nephrology Urology  Procedures:  Rt IJ HD catheter 7/13 >> ETT 7/13 >> 7/17 Rt radial a line 7/15 > 7/18  Significant Diagnostic Tests:   CT abd/pelvis 7/09 >> s/p Rt nephrectomy.  Large volume of Rt retroperitoneal hemorrhage, moderate subcutaneous emphysema Rt abdominal wall  CT abd/pelvis 7/15 >> decreasing postoperative hemorrhage and subcutaneous emphysema, patchy consolidation b/l  Echo 7/21: LVEF 45-50% with global hypokinesis, mildly dilated RV with normal function. Mildly dilated RA. Mildly dilated ascending aorta.  LE [place] > no DVTs  Micro Data:  MRSA screen 7/09 >> negative Sputum 7/14 >> normal flora Blood 7/14 >>negative Blood 7/20>> negative  Antimicrobials:  Cefepime 7/13 >> 7/20 Vancomycin 7/13 >> 7/17  Interim history/subjective:  Off pressors.  Objective   Blood pressure (!) 113/53, pulse 90, temperature 100.2 F (37.9 C), temperature source Oral, resp. rate (!) 31, height 6\' 2"  (1.88 m), weight (!) 109.9 kg, SpO2 95 %.        Intake/Output Summary (Last 24 hours) at 06/03/2020 1034 Last data filed at 06/03/2020 1000 Gross per 24 hour  Intake 1144.06 ml  Output 0 ml  Net 1144.06 ml   Filed Weights   06/01/20  0500 06/02/20 0500 06/03/20 0318  Weight: (!) 106.4 kg (!) 111.4 kg (!) 109.9 kg    Examination:  General - alert Eyes - pupils reactive ENT - no sinus tenderness, no stridor Cardiac - regular rate/rhythm, no murmur Chest - equal breath sounds b/l, no wheezing or rales Abdomen - soft, non tender, + bowel sounds Extremities - no cyanosis, clubbing, or edema Skin - no rashes Neuro - normal strength, moves extremities, follows commands Psych - normal mood and behavior  Resolved Hospital Problem list   Septic shock, Acute metabolic encephalopathy 2nd to hypercapnia and renal failure, Acute hypoxic/hypercapnic respiratory failure  Assessment & Plan:   Hypotension in setting of ESRD. - improved after adding solu cortef 7/26 and now off pressors - continue midodrine, solu cortef - goal SBP > 90  Hx of asthma. - continue pulmicort, brovana, prn albuterol  ESRD. - off CRRT 7/26 - will likely need transition to tunneled catheter at some point - iHD per renal  S/p Rt nephrectomy for renal cell carcinoma. - post op care per urology  Anemia from acute blood loss after surgery and chronic disease.   - f/u CBC - transfuse for Hb < 7 or significant bleeding  DM type 2, hyperglycemia controlled  - SSI - increase levemir to 12 units daily  Chronic diastolic CHF, HLD. - monitor hemodynamics  Deconditioning - PT/OT  Dysphagia. - speech therapy recommending regular diet with thin liquids from assessment on 7/26 - still not taking sufficient caloric intake >> keep cortrak and  tube feeds for now   Best practice:  Diet: tube feeds DVT prophylaxis: SCDs GI prophylaxis: not indicated Mobility: PT/OT Code Status: full Disposition: transfer to progressive care.  Will ask [facility] to assume medical management from 7/28.  Labs    CMP Latest Ref Rng & Units 06/03/2020 06/02/2020 06/02/2020  Glucose 70 - 99 mg/dL 240(H) 289(H) 150(H)  BUN 8 - 23 mg/dL 49(H) 42(H) 24(H)  Creatinine 0.61  - 1.24 mg/dL 6.46(H) 5.81(H) 3.26(H)  Sodium 135 - 145 mmol/L 135 136 139  Potassium 3.5 - 5.1 mmol/L 4.6 5.1 4.4  Chloride 98 - 111 mmol/L 102 103 102  CO2 22 - 32 mmol/L 22 22 26   Calcium 8.9 - 10.3 mg/dL 8.3(L) 8.2(L) 8.7(L)  Total Protein 6.5 - 8.1 g/dL - - -  Total Bilirubin 0.3 - 1.2 mg/dL - - -  Alkaline Phos 38 - 126 U/L - - -  AST 15 - 41 U/L - - -  ALT 0 - 44 U/L - - -    CBC Latest Ref Rng & Units 06/03/2020 06/02/2020 06/02/2020  WBC 4.0 - 10.5 K/uL 14.3(H) 12.1(H) 14.1(H)  Hemoglobin 13.0 - 17.0 g/dL 7.0(L) 7.2(L) 8.6(L)  Hematocrit 39 - 52 % 22.6(L) 23.4(L) 28.5(L)  Platelets 150 - 400 K/uL 243 209 186    ABG    Component Value Date/Time   PHART 7.409 05/23/2020 0400   PCO2ART 38.5 05/23/2020 0400   PO2ART 85 05/23/2020 0400   HCO3 24.4 05/23/2020 0400   TCO2 26 05/23/2020 0400   ACIDBASEDEF 1.0 05/20/2020 2308   O2SAT 97.0 05/23/2020 0400    CBG (last 3)  Recent Labs    06/02/20 2307 06/03/20 0308 06/03/20 0721  GLUCAP 260* 232* 229*   Signature:  Chesley Mires, MD [place] Pager - [phone] 06/03/2020, 10:34 AM

## 2020-06-03 NOTE — Progress Notes (Signed)
[facility] Progress Note  Interval events/Subjective:   Off pressors  Feels well  No UOP  K 4.6   Vitals:   06/03/20 0700 06/03/20 0800 06/03/20 0846 06/03/20 0848  BP: (!) 99/50 (!) 92/61    Pulse: 92 (!) 117 95   Resp: (!) 28 (!) 28 (!) 24   Temp: 100.2 F (37.9 C)     TempSrc: Oral     SpO2: 95% 97% 99% 99%  Weight:      Height:        Exam: Gen: nad Heent: +ngt CV: rrr, s1s2, no m/r/g Resp: cta bl, no overt w/r/r/c, normal wob Abd: soft, distension significantly improved Ext: no edema Neuro: awake, following commands, speech clear and coherent Rt temp hd cath (trialysis)    Home meds:  - asa 14m/ lipitor 40 hs/ benazepril 20 am/ bumex 170mqd/ coreg 25 bid  - allopurinol 300 hs  - symbicort ACT 2 puff bid  - prn's/ vitamins/ supplements  Date                             Creat               egFR   2018                          1.79- 2.38        30- 43   2019                          1.80- 1.81        42-43   January 12, 2020           3.00                    May 13, 2020                2.60                 27   Jul 9                           2.51                 29- 33   Jul 10                         3.42, 3.89    UA none yet   CT abd wo contrast 7/9 - Adrenals/Urinary Tract: Post right nephrectomy. Large volume of hemorrhage in the right nephrectomy bed. Ill-defined air fluid collection tracks in the right retroperitoneum anterior to the iliopsoas muscle and laterally in the abdominal cavity. Hemorrhage tracks into the right aspect of the pelvis in the is only partially included in the field of view. There is moderate subcutaneous emphysema involving the right abdominal wall. Small foci of air dissects between the right abdominal wall musculature. Simple cyst in the left kidney. There is no left hydronephrosis.  CT abd w/ contrast 05/28/2020 1. Slight decrease in size of right retroperitoneal hematoma following right nephrectomy. 2. No  enhancing abscess is present. 3. Improved subcutaneous gas over the right side of the abdomen. 4. Improved bibasilar airspace disease and effusions, right greater than left. 5. Stable moderate dilation of the transverse colon. 6.  Stable dilation of the stomach despite OG tube in place 7. Multilevel degenerative changes in the lumbar spine. 8. Aortic Atherosclerosis (ICD10-I70.0).        Recent Labs  Lab 06/02/20 2039 06/03/20 0157  K 5.1 4.6  BUN 42* 49*  CREATININE 5.81* 6.46*  CALCIUM 8.2* 8.3*  PHOS 3.5 3.4  HGB 7.2* 7.0*   Inpatient medications: . arformoterol  15 mcg Nebulization BID  . budesonide (PULMICORT) nebulizer solution  0.5 mg Nebulization BID  . chlorhexidine gluconate (MEDLINE KIT)  15 mL Mouth Rinse BID  . Chlorhexidine Gluconate Cloth  6 each Topical Daily  . darbepoetin (ARANESP) injection - NON-DIALYSIS  60 mcg Subcutaneous Q Sat-1800  . feeding supplement (PROSource TF)  90 mL Per Tube BID  . hydrocortisone sod succinate (SOLU-CORTEF) inj  50 mg Intravenous Q6H  . insulin aspart  0-9 Units Subcutaneous Q4H  . insulin detemir  7 Units Subcutaneous QHS  . mouth rinse  15 mL Mouth Rinse q12n4p  . midodrine  15 mg Oral TID WC  . pantoprazole sodium  40 mg Per Tube Q24H  . sodium chloride flush  10-40 mL Intracatheter Q12H   . sodium chloride Stopped (05/27/20 0829)  . sodium chloride    . feeding supplement (NEPRO CARB STEADY) 50 mL/hr at 06/03/20 0322  . phenylephrine (NEO-SYNEPHRINE) Adult infusion Stopped (06/02/20 2217)   sodium chloride, acetaminophen **OR** acetaminophen, albuterol, diphenhydrAMINE **OR** diphenhydrAMINE, heparin, loperamide HCl, menthol-cetylpyridinium, ondansetron (ZOFRAN) IV, phenol, promethazine, Resource ThickenUp Clear, sodium chloride flush, tiZANidine   Assessment/ Plan: 1. Dialysis dependent AoCKD IV - b/l creat 2.60, eGFR 27 then s/p nephrectomy with resultant progressive renal insufficiency and oliguria.  Using R IJ  Temp HD cath.  Off CRRT 7/18, restarted 7/20-7/26. 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   1. iHD tomorrow: 2K, 3.5h, 2-3L UF, TDC 400/800, No heparin 2. WIll need TDC when out of ICU 3. WIll need CLIP 2. Renal mass - sp R nephrectomy 05/16/20; neg margins on path 3. Anemia ckd / abl - on Fe and ESA, neg margins on path 4. Shock, resolved 5. Hyponatremia - resolved 6. Hyperkalemia - resolved, 7. Leukocytosis: persistent, afebrile 8. CKD-BMD: P  And Ca ok  Rexene Agent , MD [facility]

## 2020-06-03 NOTE — Progress Notes (Addendum)
Brief Nutrition Note  Noted that pt's tube feeding was changed yesterday to Nepro. Pt currently receiving Nepro @ 60 ml/hr via post-pyloric Cortrak.  Per discussion with CCM during rounds, okay to change back to Vital 1.5 formula. Given pt with recent ileus and N/V, do not recommend Nepro formula due to high fat content. Suspect pt will tolerate Vital 1.5 formula (hydrolyzed, peptide-based formula) better. Also, reviewed potassium and phosphorus labs which are both WNL at this time.  Will reorder previous tube feeding regimen: - Vital 1.5 @ 60 ml/hr via post-pyloric Cortrak tube - Continue ProSource TF 90 ml BID  Tube feeding regimen provides 2320 kcal, 141 grams of protein, and 1100 ml of H2O.   Gaynell Face, MS, RD, LDN Inpatient Clinical Dietitian Please see AMiON for contact information.

## 2020-06-03 NOTE — Progress Notes (Signed)
18 Days Post-Op   Subjective/Chief Complaint:  Afebrile overnight. BP improving, now off phenylephrine 1 BM yesterday CRRT held, plans for iHD today Worked with PT yesterday, attempted to get up to side of bed Doing well this morning, alert and awake  Objective: Vital signs in last 24 hours: Temp:  [98.8 F (37.1 C)-100.2 F (37.9 C)] 100.2 F (37.9 C) (07/27 1100) Pulse Rate:  [85-117] 85 (07/27 1300) Resp:  [17-37] 29 (07/27 1300) BP: (87-124)/(42-68) 124/62 (07/27 1200) SpO2:  [93 %-100 %] 98 % (07/27 1300) Weight:  [109.9 kg] 109.9 kg (07/27 0318) Last BM Date: 06/03/20  Intake/Output from previous day: 07/26 0701 - 07/27 0700 In: 1092.5 [I.V.:89.2; NG/GT:1003.3] Out: 0  Intake/Output this shift: Total I/O In: 350 [NG/GT:350] Out: -   General appearance: Awake, alert  Resp: breathing comfortably on room air Cardio: HR 70s on monitor, regular.  Extremities: edema improving Abd: Obese, soft, much less distended.   Lab Results:  Recent Labs    06/02/20 2039 06/03/20 0157  WBC 12.1* 14.3*  HGB 7.2* 7.0*  HCT 23.4* 22.6*  PLT 209 243   BMET Recent Labs    06/02/20 2039 06/03/20 0157  NA 136 135  K 5.1 4.6  CL 103 102  CO2 22 22  GLUCOSE 289* 240*  BUN 42* 49*  CREATININE 5.81* 6.46*  CALCIUM 8.2* 8.3*   PT/INR Recent Labs    06/01/20 1157  LABPROT 13.8  INR 1.1   ABG No results for input(s): PHART, HCO3 in the last 72 hours.  Invalid input(s): PCO2, PO2  Studies/Results: No results found.  Anti-infectives: Anti-infectives (From admission, onward)   Start     Dose/Rate Route Frequency Ordered Stop   05/27/20 1530  ceFEPIme (MAXIPIME) 2 g in sodium chloride 0.9 % 100 mL IVPB        2 g 200 mL/hr over 30 Minutes Intravenous  Once 05/27/20 1140 05/27/20 1851   05/26/20 2200  ceFEPIme (MAXIPIME) 1 g in sodium chloride 0.9 % 100 mL IVPB  Status:  Discontinued        1 g 200 mL/hr over 30 Minutes Intravenous Every 24 hours 05/25/20 1342  05/27/20 1140   05/22/20 1200  vancomycin (VANCOREADY) IVPB 1250 mg/250 mL  Status:  Discontinued        1,250 mg 166.7 mL/hr over 90 Minutes Intravenous Every 24 hours 05/21/20 1347 05/24/20 0855   05/22/20 0400  vancomycin (VANCOREADY) IVPB 1250 mg/250 mL  Status:  Discontinued        1,250 mg 166.7 mL/hr over 90 Minutes Intravenous Every 24 hours 05/21/20 0218 05/21/20 1201   05/21/20 1201  vancomycin variable dose per unstable renal function (pharmacist dosing)  Status:  Discontinued         Does not apply See admin instructions 05/21/20 1201 05/22/20 0708   05/21/20 0315  vancomycin (VANCOCIN) 2,500 mg in sodium chloride 0.9 % 500 mL IVPB        2,500 mg 250 mL/hr over 120 Minutes Intravenous  Once 05/21/20 0218 05/21/20 0540   05/21/20 0315  ceFEPIme (MAXIPIME) 2 g in sodium chloride 0.9 % 100 mL IVPB  Status:  Discontinued        2 g 200 mL/hr over 30 Minutes Intravenous Every 12 hours 05/21/20 0218 05/25/20 1342   05/16/20 1700  ceFAZolin (ANCEF) IVPB 1 g/50 mL premix        1 g 100 mL/hr over 30 Minutes Intravenous Every 8 hours 05/16/20 1600 05/17/20 0206  05/16/20 0600  ceFAZolin (ANCEF) 3 g in dextrose 5 % 50 mL IVPB        3 g 100 mL/hr over 30 Minutes Intravenous 30 min pre-op 05/15/20 0725 05/16/20 1944      Assessment/Plan:  1 - RIGHT Renal Neoplasm - s/p nephrectomy. no further cancer directed therapy this admission.    2 - Acute on Chronic Renal Failure / Solitary Left Kidney - Per nephrology, plan to transition to iHD from CRRT  3 - Uremic encephalopathy / ICU delirium - resolved  4 - Fever / PNA / leukocytosis - Leukocytosis resolving, but patient still intermittently febrile.  cefepime discontinued. No evidence of intra-abdominal infection on CT scan, and improving PNA. Blood cultures negative. Low concern for active infection.   5- Acute Blood Loss Anemia - No evidence of active post operative bleeding.   Agree with PRN transfusion if needed from a  cardiovascular/hemodynamic standpoint. Stable from a urologic standpoint to continue DVT ppx.  6 - Constipation/Ileus - Receiving tube feeds via post pyloric corpak, appreciate nutrition advice  7 - Hypotension - improving, now off pressors, stable to transfer to stepdown. Remains on midodrine   Todd Robertson 06/03/2020

## 2020-06-04 DIAGNOSIS — I428 Other cardiomyopathies: Secondary | ICD-10-CM

## 2020-06-04 DIAGNOSIS — E440 Moderate protein-calorie malnutrition: Secondary | ICD-10-CM

## 2020-06-04 DIAGNOSIS — Z794 Long term (current) use of insulin: Secondary | ICD-10-CM

## 2020-06-04 DIAGNOSIS — E119 Type 2 diabetes mellitus without complications: Secondary | ICD-10-CM

## 2020-06-04 DIAGNOSIS — N185 Chronic kidney disease, stage 5: Secondary | ICD-10-CM

## 2020-06-04 LAB — RENAL FUNCTION PANEL
Albumin: 2.4 g/dL — ABNORMAL LOW (ref 3.5–5.0)
Anion gap: 16 — ABNORMAL HIGH (ref 5–15)
BUN: 89 mg/dL — ABNORMAL HIGH (ref 8–23)
CO2: 21 mmol/L — ABNORMAL LOW (ref 22–32)
Calcium: 8.2 mg/dL — ABNORMAL LOW (ref 8.9–10.3)
Chloride: 97 mmol/L — ABNORMAL LOW (ref 98–111)
Creatinine, Ser: 9.32 mg/dL — ABNORMAL HIGH (ref 0.61–1.24)
GFR calc Af Amer: 6 mL/min — ABNORMAL LOW (ref >60)
GFR calc non Af Amer: 5 mL/min — ABNORMAL LOW (ref >60)
Glucose, Bld: 349 mg/dL — ABNORMAL HIGH (ref 70–99)
Phosphorus: 4.1 mg/dL (ref 2.5–4.6)
Potassium: 4.4 mmol/L (ref 3.5–5.1)
Sodium: 134 mmol/L — ABNORMAL LOW (ref 135–145)

## 2020-06-04 LAB — GLUCOSE, CAPILLARY
Glucose-Capillary: 246 mg/dL — ABNORMAL HIGH (ref 70–99)
Glucose-Capillary: 312 mg/dL — ABNORMAL HIGH (ref 70–99)
Glucose-Capillary: 324 mg/dL — ABNORMAL HIGH (ref 70–99)
Glucose-Capillary: 335 mg/dL — ABNORMAL HIGH (ref 70–99)
Glucose-Capillary: 371 mg/dL — ABNORMAL HIGH (ref 70–99)

## 2020-06-04 LAB — CBC
HCT: 21.5 % — ABNORMAL LOW (ref 39.0–52.0)
Hemoglobin: 6.8 g/dL — CL (ref 13.0–17.0)
MCH: 30.4 pg (ref 26.0–34.0)
MCHC: 31.6 g/dL (ref 30.0–36.0)
MCV: 96 fL (ref 80.0–100.0)
Platelets: 256 10*3/uL (ref 150–400)
RBC: 2.24 MIL/uL — ABNORMAL LOW (ref 4.22–5.81)
RDW: 18.2 % — ABNORMAL HIGH (ref 11.5–15.5)
WBC: 17.4 10*3/uL — ABNORMAL HIGH (ref 4.0–10.5)
nRBC: 0.1 % (ref 0.0–0.2)

## 2020-06-04 LAB — HEPATITIS B SURFACE ANTIGEN: Hepatitis B Surface Ag: NONREACTIVE

## 2020-06-04 LAB — HEMOGLOBIN AND HEMATOCRIT, BLOOD
HCT: 24.5 % — ABNORMAL LOW (ref 39.0–52.0)
Hemoglobin: 7.7 g/dL — ABNORMAL LOW (ref 13.0–17.0)

## 2020-06-04 LAB — PREPARE RBC (CROSSMATCH)

## 2020-06-04 MED ORDER — LIDOCAINE VISCOUS HCL 2 % MT SOLN
15.0000 mL | Freq: Four times a day (QID) | OROMUCOSAL | Status: DC | PRN
  Administered 2020-06-10 (×2): 15 mL via OROMUCOSAL
  Filled 2020-06-04 (×2): qty 15

## 2020-06-04 MED ORDER — ALTEPLASE 2 MG IJ SOLR
2.0000 mg | Freq: Once | INTRAMUSCULAR | Status: AC | PRN
  Administered 2020-06-04: 4 mg

## 2020-06-04 MED ORDER — DIPHENHYDRAMINE HCL 25 MG PO CAPS
25.0000 mg | ORAL_CAPSULE | Freq: Once | ORAL | Status: AC
  Administered 2020-06-04: 25 mg via ORAL
  Filled 2020-06-04: qty 1

## 2020-06-04 MED ORDER — SODIUM CHLORIDE 0.9% IV SOLUTION: Freq: Once | INTRAVENOUS | Status: AC

## 2020-06-04 MED ORDER — MAGIC MOUTHWASH
10.0000 mL | Freq: Four times a day (QID) | ORAL | Status: DC | PRN
  Filled 2020-06-04: qty 10

## 2020-06-04 MED ORDER — MAGIC MOUTHWASH: 10.0000 mL | Freq: Four times a day (QID) | ORAL | Status: DC | PRN

## 2020-06-04 MED ORDER — ALTEPLASE 2 MG IJ SOLR
INTRAMUSCULAR | Status: AC
  Filled 2020-06-04: qty 4

## 2020-06-04 MED ORDER — HEPARIN SODIUM (PORCINE) 1000 UNIT/ML DIALYSIS
1000.0000 [IU] | INTRAMUSCULAR | Status: DC | PRN
  Filled 2020-06-04 (×2): qty 1

## 2020-06-04 MED ORDER — ACETAMINOPHEN 325 MG PO TABS
650.0000 mg | ORAL_TABLET | Freq: Once | ORAL | Status: AC
  Administered 2020-06-04: 650 mg via ORAL
  Filled 2020-06-04: qty 2

## 2020-06-04 MED ORDER — SODIUM CHLORIDE 0.9 % IV SOLN: 100.0000 mL | INTRAVENOUS | Status: DC | PRN

## 2020-06-04 MED ORDER — INSULIN ASPART 100 UNIT/ML ~~LOC~~ SOLN
2.0000 [IU] | SUBCUTANEOUS | Status: DC
  Administered 2020-06-04 – 2020-06-06 (×10): 2 [IU] via SUBCUTANEOUS

## 2020-06-04 MED FILL — Vasopressin IV Soln 20 Unit/ML (For IV Infusion): INTRAVENOUS | Qty: 1 | Status: AC

## 2020-06-04 MED FILL — Sodium Chloride IV Soln 0.9%: INTRAVENOUS | Qty: 100 | Status: AC

## 2020-06-04 NOTE — Progress Notes (Addendum)
PROGRESS NOTE  Todd Robertson ZLD:[id number] DOB: 02/06/1946 DOA: 05/16/2020 PCP: Monico Blitz, MD   LOS: 19 days   Brief narrative: As per HPI,  [name] is a 75 years old male with a history of right renal mass, prostate cancer, osteoarthritis, cardiomyopathy, CKD, diabetes mellitus type 2, gout, hypertension, asthma presented to hospital for elective nephrectomy.. 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  He underwent right nephrectomy on 05/16/2020.  Creatinine on presentation was 2.6.  Postoperatively, patient developed hypotension and patient was transferred to the ICU.  Patient received vasopressors for hypotension.  Patient was subsequently considered medically stable for transfer out of ICU.  Hospitalist team was consulted for medical management.  Assessment/Plan:  Active Problems:   Cancer of kidney [facility])   S/p nephrectomy   Malnutrition of moderate degree   Hypotension Patient was transferred to the ICU after surgical intervention.  Received Solu-Cortef on 7/26.  Was initially on vasopressors which has been off.  On midodrine and Solu-Cortef at this time.  Blood pressure has improved.   Hx of asthma. 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  Intermittent hemodialysis as per nephrology.  S/p Rt nephrectomy for renal cell carcinoma. As per primary team.  Anemia postoperative from blood loss plus anemia of chronic disease. Ttransfuse for Hb < 7 or significant bleeding.  Patient will receive  packed RBC today.  Hemoglobin of 6.8 today from 7.0 yesterday.  Check hemoglobin in a.m.  DM type 2 with hyperglycemia  Continue sliding scale insulin Accu-Cheks diabetic diet.  Will adjust insulin regimen.  Currently on Levemir 12 units at nighttime and sliding scale insulin every 4 hours.  Add NovoLog 2 units every 4 hours.  Chronic diastolic CHF, HLD. Continue to monitor.  Deconditioning - PT/OT has recommended CIR  placement.  Dysphagia. - speech therapy recommending regular diet with thin liquids from assessment on 7/26 Poor oral intake so patient is still on tube feeding.    DVT prophylaxis: SCDs Start: 05/16/20 1601   Code Status: Full code  Family Communication: Spoke with the patient's daughter on the phone and updated her about the clinical condition of the patient.  Answered queries she had.  Status is: Inpatient  Consultants: Nephrology Urology PCCM  Procedures: 7/09 Rt nephrectomy 7/13 Rt IJ HD catheter by IR; worsening hypercapnia and AMS >> intubated,  7/14 start CRRT Rt IJ HD catheter 7/13 >> ETT 7/13 >> 7/17 Rt radial a line 7/15 > 7/18   Antibiotics:  Cefepime 7/13 >> 7/20 Vancomycin 7/13 >> 7/17   Subjective: Today, patient was seen and examined at bedside.  Patient feels okay.  Denies any shortness of breath cough chest pain.  He however has mild indigestion.  Objective: Vitals:   06/03/20 2346 06/04/20 0322  BP: (!) 115/56 (!) 119/62  Pulse: 90 89  Resp: (!) 28 (!) 24  Temp: 100 F (37.8 C) 99.1 F (37.3 C)  SpO2: 97% 100%    Intake/Output Summary (Last 24 hours) at 06/04/2020 0721 Last data filed at 06/04/2020 9030 Gross per 24 hour  Intake 1850 ml  Output 0 ml  Net 1850 ml   Filed Weights   06/03/20 0318 06/03/20 1529 06/04/20 0322  Weight: (!) 109.9 kg (!) 109.9 kg (!) 112 kg   Body mass index is 31.7 kg/m.   Physical Exam: GENERAL: Patient is alert awake and communicative.  Not in obvious distress.  Cortrak tube tube in place.  Obese HENT: No scleral pallor  or icterus. Pupils equally reactive to light. Oral mucosa is moist NECK: is supple, no gross swelling noted.  Right internal jugular hemodialysis catheter in place. CHEST: Clear to auscultation. No crackles or wheezes.  Diminished breath sounds bilaterally. CVS: S1 and S2 heard, no murmur. Regular rate and rhythm.  ABDOMEN: Soft, non-tender, bowel sounds are present.  Obese  abdomen. EXTREMITIES: No edema. CNS: Cranial nerves are intact. No focal motor deficits. SKIN: warm and dry without rashes.  Data Review: I have personally reviewed the following laboratory data and studies,  CBC: Recent Labs  Lab 06/01/20 1157 06/02/20 0153 06/02/20 2039 06/03/20 0157 06/04/20 0434  WBC 17.5* 14.1* 12.1* 14.3* 17.4*  HGB 8.7* 8.6* 7.2* 7.0* 6.8*  HCT 29.1* 28.5* 23.4* 22.6* 21.5*  MCV 98.6 96.3 95.1 94.2 96.0  PLT 214 186 209 243 701   Basic Metabolic Panel: Recent Labs  Lab 05/31/20 0423 05/31/20 1559 06/01/20 0232 06/01/20 0232 06/01/20 1545 06/02/20 0153 06/02/20 2039 06/03/20 0157 06/04/20 0434  NA 141   < > 140   < > 137 139 136 135 134*  K 4.5   < > 4.7   < > 4.5 4.4 5.1 4.6 4.4  CL 101   < > 103   < > 104 102 103 102 97*  CO2 29   < > 26   < > 24 26 22 22  21*  GLUCOSE 197*   < > 157*   < > 173* 150* 289* 240* 349*  BUN 30*   < > 27*   < > 25* 24* 42* 49* 89*  CREATININE 3.33*   < > 3.18*   < > 3.40* 3.26* 5.81* 6.46* 9.32*  CALCIUM 8.4*   < > 8.2*   < > 8.2* 8.7* 8.2* 8.3* 8.2*  MG 2.6*  --  2.5*  --   --  2.6* 2.5* 2.6*  --   PHOS 2.7   < > 2.6   < > 2.3* 2.3* 3.5 3.4 4.1   < > = values in this interval not displayed.   Liver Function Tests: Recent Labs  Lab 06/01/20 1545 06/02/20 0153 06/02/20 2039 06/03/20 0157 06/04/20 0434  ALBUMIN 2.5* 2.7* 2.4* 2.5* 2.4*   No results for input(s): LIPASE, AMYLASE in the last 168 hours. No results for input(s): AMMONIA in the last 168 hours. Cardiac Enzymes: No results for input(s): CKTOTAL, CKMB, CKMBINDEX, TROPONINI in the last 168 hours. BNP (last 3 results) No results for input(s): BNP in the last 8760 hours.  ProBNP (last 3 results) No results for input(s): PROBNP in the last 8760 hours.  CBG: Recent Labs  Lab 06/03/20 1112 06/03/20 1527 06/03/20 2018 06/03/20 2343 06/04/20 0435  GLUCAP 271* 265* 293* 290* 335*   Recent Results (from the past 240 hour(s))  Culture, blood  (routine x 2)     Status: None   Collection Time: 05/27/20  3:00 PM   Specimen: BLOOD LEFT HAND  Result Value Ref Range Status   Specimen Description BLOOD LEFT HAND  Final   Special Requests   Final    BOTTLES DRAWN AEROBIC ONLY Blood Culture adequate volume   Culture   Final    NO GROWTH 5 DAYS Performed at [facility], [place] [address], [place],  [address]    Report Status 06/01/2020 FINAL  Final  Culture, blood (routine x 2)     Status: None   Collection Time: 05/27/20  3:15 PM   Specimen: BLOOD RIGHT HAND  Result Value Ref Range Status   Specimen Description BLOOD RIGHT HAND  Final   Special Requests IN PEDIATRIC BOTTLE Blood Culture adequate volume  Final   Culture   Final    NO GROWTH 5 DAYS Performed at [facility], [address] [place], [place] [address]    Report Status 06/01/2020 FINAL  Final     Studies: No results found.    Flora Lipps, MD  [facility] 06/04/2020

## 2020-06-04 NOTE — Progress Notes (Addendum)
Inpatient Diabetes Program Recommendations  AACE/ADA: New Consensus Statement on Inpatient Glycemic Control (2015)  Target Ranges:  Prepandial:   less than 140 mg/dL      Peak postprandial:   less than 180 mg/dL (1-2 hours)      Critically ill patients:  140 - 180 mg/dL   Lab Results  Component Value Date   GLUCAP 312 (H) 06/04/2020   HGBA1C 6.2 (H) 05/13/2020    Review of Glycemic Control Results for Todd Robertson, Todd Robertson (MRN [id number]) as of 06/04/2020 11:29  Ref. Range 06/03/2020 15:27 06/03/2020 20:18 06/03/2020 23:43 06/04/2020 04:35 06/04/2020 07:49  Glucose-Capillary Latest Ref Range: 70 - 99 mg/dL 265 (H) 293 (H) 290 (H) 335 (H) 312 (H)   Diabetes history: DM 2 Outpatient Diabetes medications:  None Current orders for Inpatient glycemic control:  Levemir 12 units q HS, Novolog sensitive q 4 hours Solucortef 50 mg IV q 6 hours  Inpatient Diabetes Program Recommendations:    Note blood sugars increased.  Please consider ordering Novolog 2 units q 4 hours while on Tube feeds while on steroids.  Thanks,  Adah Perl, RN, BC-ADM Inpatient Diabetes Coordinator Pager [phone] (8a-5p)

## 2020-06-04 NOTE — Progress Notes (Signed)
Lysbeth Galas R.N. with [facility] call and said [facility] is taking over patient care today and to call them with the HBG results 6.8. Text paged Dr. Tonie Griffith The results

## 2020-06-04 NOTE — TOC Progression Note (Signed)
Transition of Care [facility]) - Progression Note    Patient Details  Name: Todd Robertson MRN: [id number] Date of Birth: 09-09-1946  Transition of Care [facility]) CM/SW Contact  Graves-Bigelow, Ocie Cornfield, RN Phone Number: 06/04/2020, 4:05 PM  Clinical Narrative:  Case Manager spoke with CIR Admissions Coordinator [facility]). CIR AC wants to see how patient tolerates intermittent HD and to make sure the patient has been clipped to an outpatient HD center if he needs permanent HD. CIR AC is following the patient closely for medical readiness. Case Manager will continue to follow for transition of care needs.   Expected Discharge Plan: IP Rehab Facility Barriers to Discharge: Continued Medical Work up  Expected Discharge Plan and Services Expected Discharge Plan: [facility]   Discharge Planning Services: CM Consult Post Acute Care Choice: IP Rehab    Readmission Risk Interventions No flowsheet data found.

## 2020-06-04 NOTE — Progress Notes (Signed)
Inpatient Rehabilitation-Admissions Coordinator   [facility] continues to follow for medical readiness and thearpy tolerance. Will need to see how pt tolerates iHD and watch for permanent schedule if pt is ESRD.   Raechel Ache, OTR/L  Rehab Admissions Coordinator  [phone] 06/04/2020 12:50 PM

## 2020-06-04 NOTE — Progress Notes (Signed)
[facility] Progress Note  Interval events/Subjective:   Out of ICU  For iHD today  Feels well  No UOP  K 4.4  Hb 6.8 this AM   Vitals:   06/04/20 0857 06/04/20 1108 06/04/20 1148 06/04/20 1151  BP: 120/68 (!) 120/61  (!) 134/70  Pulse: 100 93 92 90  Resp: 18 21 (!) 24 (!) 27  Temp: 98.6 F (37 C) 98.9 F (37.2 C) 99.3 F (37.4 C) 99.3 F (37.4 C)  TempSrc: Oral Oral Oral Oral  SpO2: 96% 98% 95% 96%  Weight:      Height:        Exam: Gen: nad Heent: +ngt CV: rrr, s1s2, no m/r/g Resp: cta bl, no overt w/r/r/c, normal wob Abd: soft, distension significantly improved Ext: no edema Neuro: awake, following commands, speech clear and coherent Rt temp hd cath (trialysis)    Home meds:  - asa 13m/ lipitor 40 hs/ benazepril 20 am/ bumex 178mqd/ coreg 25 bid  - allopurinol 300 hs  - symbicort ACT 2 puff bid  - prn's/ vitamins/ supplements  Date                             Creat               egFR   2018                          1.79- 2.38        30- 43   2019                          1.80- 1.81        42-43   January 12, 2020           3.00                    May 13, 2020                2.60                 27   Jul 9                           2.51                 29- 33   Jul 10                         3.42, 3.89    UA none yet   CT abd wo contrast 7/9 - Adrenals/Urinary Tract: Post right nephrectomy. Large volume of hemorrhage in the right nephrectomy bed. Ill-defined air fluid collection tracks in the right retroperitoneum anterior to the iliopsoas muscle and laterally in the abdominal cavity. Hemorrhage tracks into the right aspect of the pelvis in the is only partially included in the field of view. There is moderate subcutaneous emphysema involving the right abdominal wall. Small foci of air dissects between the right abdominal wall musculature. Simple cyst in the left kidney. There is no left hydronephrosis.  CT abd w/ contrast  05/28/2020 1. Slight decrease in size of right retroperitoneal hematoma following right nephrectomy. 2. No enhancing abscess is present. 3. Improved subcutaneous gas over the right side of the abdomen. 4. Improved bibasilar  airspace disease and effusions, right greater than left. 5. Stable moderate dilation of the transverse colon. 6. Stable dilation of the stomach despite OG tube in place 7. Multilevel degenerative changes in the lumbar spine. 8. Aortic Atherosclerosis (ICD10-I70.0).        Recent Labs  Lab 06/03/20 0157 06/04/20 0434  K 4.6 4.4  BUN 49* 89*  CREATININE 6.46* 9.32*  CALCIUM 8.3* 8.2*  PHOS 3.4 4.1  HGB 7.0* 6.8*   Inpatient medications: . sodium chloride   Intravenous Once  . arformoterol  15 mcg Nebulization BID  . budesonide (PULMICORT) nebulizer solution  0.5 mg Nebulization BID  . chlorhexidine gluconate (MEDLINE KIT)  15 mL Mouth Rinse BID  . Chlorhexidine Gluconate Cloth  6 each Topical Daily  . darbepoetin (ARANESP) injection - NON-DIALYSIS  60 mcg Subcutaneous Q Sat-1800  . feeding supplement (PROSource TF)  90 mL Per Tube BID  . hydrocortisone sod succinate (SOLU-CORTEF) inj  50 mg Intravenous Q6H  . insulin aspart  0-9 Units Subcutaneous Q4H  . insulin detemir  12 Units Subcutaneous QHS  . mouth rinse  15 mL Mouth Rinse q12n4p  . midodrine  15 mg Oral TID WC  . sodium chloride flush  10-40 mL Intracatheter Q12H   . sodium chloride Stopped (05/27/20 0829)  . sodium chloride    . sodium chloride    . sodium chloride    . feeding supplement (VITAL 1.5 CAL) 1,000 mL (06/04/20 1010)   sodium chloride, sodium chloride, sodium chloride, acetaminophen **OR** acetaminophen, albuterol, alteplase, diphenhydrAMINE **OR** diphenhydrAMINE, heparin, heparin, loperamide HCl, menthol-cetylpyridinium, ondansetron (ZOFRAN) IV, phenol, promethazine, Resource ThickenUp Clear, sodium chloride flush, tiZANidine   Assessment/ Plan: 1. Dialysis dependent AoCKD  IV - b/l creat 2.60, eGFR 27 then s/p nephrectomy with resultant progressive renal insufficiency and oliguria.  He is now ESRD.  Using R IJ Temp HD cath.  Off CRRT 7/18, restarted 7/20-7/26. 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   1. iHD today: 2K, 3.5h, 2-3L UF, TDC 400/800, No heparin 2. Consult VVS for vascular access, AVF and ? TDC 3. Begin CLIP 2. Renal mass - sp R nephrectomy 05/16/20; neg margins on path 3. Anemia ckd / abl - on Fe and ESA, neg margins on path; Hb town for 1u PRBC today 4. Shock, resolved 5. Hyponatremia - resolved 6. Hyperkalemia - resolved, 7. Leukocytosis: persistent, afebrile 8. CKD-BMD: P  And Ca ok  Rexene Agent , MD [facility]

## 2020-06-04 NOTE — Progress Notes (Signed)
Physical Therapy Treatment Patient Details Name: Todd Robertson MRN: [id number] DOB: 07/15/1946 Today's Date: 06/04/2020    History of Present Illness 74 yo male presenting 7/9 for planned right Nephrectomy. Went to the OR that day.  While in the PACU had declining w/ systolic BPs in 40J. Placed in Trendelenburg; IVF boluses administered. BP normalized but only made 78ml of urine. Large volume of hemorrhage in R nephrectomy, R retoperitoneum anerior ro the ilipsoas muslce and latral abdominal cavity.  Pt is dialysis dependent and presumed septic shock with unknown etiology noted in chart. PMH  h/o prostate CA, DM2, gout, HTN    PT Comments    Pt progressing well with goals.  Pt interested in getting OOB, starting to go to the BR and starting to walk.  Emphasis on transitions, sit to stand, pre gait and progressing gait into the halls with a RW.  VSS overall.    Follow Up Recommendations  CIR     Equipment Recommendations  Other (comment) (TBD)    Recommendations for Other Services       Precautions / Restrictions Precautions Precautions: Fall Restrictions Weight Bearing Restrictions: No    Mobility  Bed Mobility Overal bed mobility: Needs Assistance Bed Mobility: Supine to Sit     Supine to sit: Supervision Sit to supine: Min guard   General bed mobility comments: bridged without assist and came up slowly, but without effort and no assist.  returned to supine with equal ease.  Transfers Overall transfer level: Needs assistance Equipment used: None Transfers: Sit to/from Stand Sit to Stand: Min assist;Min guard         General transfer comment: minimal assist for lower surface or without arm rests.  Ambulation/Gait Ambulation/Gait assistance: Min assist;Min guard (with time up, pt's stability improved with RW) Gait Distance (Feet): 30 Feet (then 45 feet with rest in between.) Assistive device: Rolling walker (2 wheeled) Gait Pattern/deviations: Step-through  pattern Gait velocity: slower Gait velocity interpretation: <1.8 ft/sec, indicate of risk for recurrent falls General Gait Details: short, steady and equal step through pattern.   Stairs             Wheelchair Mobility    Modified Rankin (Stroke Patients Only)       Balance Overall balance assessment: Needs assistance   Sitting balance-Leahy Scale: Fair     Standing balance support: No upper extremity supported;Bilateral upper extremity supported Standing balance-Leahy Scale: Poor Standing balance comment: reliant on external assist,  mild posterior lean                            Cognition Arousal/Alertness: Awake/alert Behavior During Therapy: Flat affect;WFL for tasks assessed/performed Overall Cognitive Status: Within Functional Limits for tasks assessed                                        Exercises Other Exercises Other Exercises: warm up BIL LE ROM exercise prior to mobility with resistance with gross extension x5 bil    General Comments General comments (skin integrity, edema, etc.): vss, sats 97% on RA and HR up to 120's low 130's, BP 136/90 (96)      Pertinent Vitals/Pain Pain Assessment: Faces Faces Pain Scale: No hurt Pain Intervention(s): Monitored during session    Home Living  Prior Function            PT Goals (current goals can now be found in the care plan section) Acute Rehab PT Goals Patient Stated Goal: independence PT Goal Formulation: With patient/family Time For Goal Achievement: 06/09/20 Potential to Achieve Goals: Good Progress towards PT goals: Progressing toward goals    Frequency    Min 3X/week      PT Plan Current plan remains appropriate    Co-evaluation              AM-PAC PT "6 Clicks" Mobility   Outcome Measure  Help needed turning from your back to your side while in a flat bed without using bedrails?: A Little Help needed moving from  lying on your back to sitting on the side of a flat bed without using bedrails?: A Little Help needed moving to and from a bed to a chair (including a wheelchair)?: A Little Help needed standing up from a chair using your arms (e.g., wheelchair or bedside chair)?: A Little Help needed to walk in hospital room?: A Little Help needed climbing 3-5 steps with a railing? : A Lot 6 Click Score: 17    End of Session   Activity Tolerance: Patient tolerated treatment well Patient left: in bed;with call bell/phone within reach Nurse Communication: Mobility status PT Visit Diagnosis: Other abnormalities of gait and mobility (R26.89);Unsteadiness on feet (R26.81)     Time: 0814-4818 PT Time Calculation (min) (ACUTE ONLY): 33 min  Charges:  $Gait Training: 8-22 mins $Therapeutic Activity: 8-22 mins                     06/04/2020  Todd Carne., PT Acute Rehabilitation Services [phone]  (pager) [phone]  (office)   Todd Robertson 06/04/2020, 3:07 PM

## 2020-06-04 NOTE — Progress Notes (Signed)
Occupational Therapy Treatment Patient Details Name: Todd Robertson MRN: [id number] DOB: 1946-05-12 Today's Date: 06/04/2020    History of present illness 74 yo male presenting 7/9 for planned right Nephrectomy. Went to the OR that day.  While in the PACU had declining w/ systolic BPs in 24O. Placed in Trendelenburg; IVF boluses administered. BP normalized but only made 67ml of urine. Large volume of hemorrhage in R nephrectomy, R retoperitoneum anerior ro the ilipsoas muslce and latral abdominal cavity.  Pt is dialysis dependent and presumed septic shock with unknown etiology noted in chart. PMH  h/o prostate CA, DM2, gout, HTN   OT comments  Pt making steady progress towards OT goals, tolerating OOB and room level mobility today using RW with overall minA (+2 safety). Pt requiring up to maxA for ADL tasks including toileting today. Pt requiring increased time for ADL/mobility tasks but tolerating session well and with good motivation to return to his PLOF. Will continue to follow while acutely admitted.   Follow Up Recommendations  CIR    Equipment Recommendations  3 in 1 bedside commode          Precautions / Restrictions Precautions Precautions: Fall       Mobility Bed Mobility Overal bed mobility: Needs Assistance Bed Mobility: Supine to Sit;Sit to Supine     Supine to sit: Min guard Sit to supine: Min guard   General bed mobility comments: for lines and safety, increased effort but no assist required   Transfers Overall transfer level: Needs assistance Equipment used: Rolling walker (2 wheeled) Transfers: Sit to/from Stand Sit to Stand: Min assist;+2 safety/equipment         General transfer comment: boosting and steadying assist, VCs for safe hand placement, stood from EOB and toilet     Balance Overall balance assessment: Needs assistance   Sitting balance-Leahy Scale: Fair     Standing balance support: Bilateral upper extremity supported Standing  balance-Leahy Scale: Poor Standing balance comment: reliant on external assist,  mild posterior lean                           ADL either performed or assessed with clinical judgement   ADL Overall ADL's : Needs assistance/impaired     Grooming: Wash/dry hands;Set up;Sitting                   Toilet Transfer: Minimal assistance;+2 for safety/equipment;Ambulation;RW;Grab bars;Regular Toilet   Toileting- Clothing Manipulation and Hygiene: Maximal assistance;+2 for safety/equipment;Sit to/from stand Toileting - Clothing Manipulation Details (indicate cue type and reason): assist for pericare in standing after BM     Functional mobility during ADLs: Minimal assistance;+2 for safety/equipment;Rolling walker       Vision       Perception     Praxis      Cognition Arousal/Alertness: Awake/alert Behavior During Therapy: Flat affect;WFL for tasks assessed/performed Overall Cognitive Status: Within Functional Limits for tasks assessed                                 General Comments: requires encouragement to engage with therapist as he is initially very limited in verbalizations, appears [facility] for basic tasks today         Exercises Exercises: Other exercises;General Upper Extremity;General Lower Extremity General Exercises - Upper Extremity Shoulder Flexion: AROM;Both;10 reps General Exercises - Lower Extremity Straight Leg Raises: AROM;Both;10 reps Other Exercises Other  Exercises: chest press with graded resistance  Other Exercises: warm up BIL LE ROM exercise prior to mobility with resistance with gross extension x5 bil   Shoulder Instructions       General Comments VSS    Pertinent Vitals/ Pain       Pain Assessment: Faces Faces Pain Scale: No hurt Pain Intervention(s): Monitored during session  Home Living                                          Prior Functioning/Environment              Frequency  Min  2X/week        Progress Toward Goals  OT Goals(current goals can now be found in the care plan section)  Progress towards OT goals: Progressing toward goals  Acute Rehab OT Goals Patient Stated Goal: independence OT Goal Formulation: With patient/family Time For Goal Achievement: 06/09/20 Potential to Achieve Goals: Good ADL Goals Pt Will Perform Grooming: with min assist;standing Pt Will Perform Lower Body Dressing: with min assist;sit to/from stand Pt Will Transfer to Toilet: with min assist;ambulating Additional ADL Goal #1: Pt will complete bed mobility with superivsion in preparation for ADL.  Plan Discharge plan remains appropriate    Co-evaluation                 AM-PAC OT "6 Clicks" Daily Activity     Outcome Measure   Help from another person eating meals?: A Little Help from another person taking care of personal grooming?: A Little Help from another person toileting, which includes using toliet, bedpan, or urinal?: A Lot Help from another person bathing (including washing, rinsing, drying)?: A Lot Help from another person to put on and taking off regular upper body clothing?: A Little Help from another person to put on and taking off regular lower body clothing?: A Lot 6 Click Score: 15    End of Session Equipment Utilized During Treatment: Gait belt;Rolling walker  OT Visit Diagnosis: Unsteadiness on feet (R26.81);Other abnormalities of gait and mobility (R26.89);Muscle weakness (generalized) (M62.81);Feeding difficulties (R63.3);Pain   Activity Tolerance Patient tolerated treatment well   Patient Left in bed;with call bell/phone within reach;with bed alarm set;with family/visitor present   Nurse Communication Mobility status        Time: 4970-2637 OT Time Calculation (min): 24 min  Charges: OT General Charges $OT Visit: 1 Visit OT Treatments $Self Care/Home Management : 8-22 mins $Therapeutic Activity: 8-22 mins  Lou Cal,  OT Acute Rehabilitation Services Pager [phone] Office [phone]     Raymondo Band 06/04/2020, 5:24 PM

## 2020-06-04 NOTE — Progress Notes (Signed)
Hbg. 7.8 was 8.0 yesterday. Call [place] R.N.  With CCM and notified her of the results

## 2020-06-04 NOTE — Progress Notes (Addendum)
Hospital Consult    Reason for Consult:  ESRD Requesting Physician:  Dr. Joelyn Oms MRN #:  [id number]  History of Present Illness: This is a 74 y.o. male who is status post right nephrectomy May 16, 2020 secondary to renal cell carcinoma with subsequent retroperitoneal hematoma, hypotension requiring vasopressors and CRRT.  He has now transitioned to intermittent hemodialysis via right IJ temporary hemodialysis catheter.  He is expected to need chronic intermittent HD and we are asked to evaluate for upper extremity dialysis access and tunneled dialysis catheter.  The patient is interviewed and examined on the progressive care unit where he is awake, alert and in no apparent distress.  2 family members are at bedside.  He is currently undergoing transfusion of 1 unit of packed red blood cells.  He is right arm dominant.  No history of pacemaker/AICD insertion or venous access device insertion.    Past Medical History:  Diagnosis Date  . Arthritis   . Cancer [facility])    prostate  . Cardiomyopathy    secondary  . CKD (chronic kidney disease)   . DM2 (diabetes mellitus, type 2) ([place])   . Gout   . HTN (hypertension)    unspec  . Hypercholesterolemia   . Nonischemic cardiomyopathy ([place])    a. EF 40-50% by most recent 2-D echo b. EF 25% by cardiac cath in 2004  . Overweight(278.02)     Past Surgical History:  Procedure Laterality Date  . COLONOSCOPY    . HERNIA REPAIR    . IR FLUORO GUIDE CV LINE RIGHT  05/20/2020  . IR US GUIDE VASC ACCESS RIGHT  05/20/2020  . KNEE ARTHROPLASTY Left 08/08/2017   Procedure: LEFT TOTAL KNEE ARTHROPLASTY WITH COMPUTER NAVIGATION;  Surgeon: Rod Can, MD;  Location: [facility];  Service: Orthopedics;  Laterality: Left;  Needs RNFA  . KNEE ARTHROPLASTY Right 11/17/2017   Procedure: RIGHT TOTAL KNEE ARTHROPLASTY WITH COMPUTER NAVIGATION;  Surgeon: Rod Can, MD;  Location: [facility];  Service: Orthopedics;  Laterality: Right;  NEEDS RNFA  . ROBOT ASSISTED  LAPAROSCOPIC NEPHRECTOMY Right 05/16/2020   Procedure: XI ROBOTIC ASSISTED LAPAROSCOPIC NEPHRECTOMY;  Surgeon: Cleon Gustin, MD;  Location: [facility];  Service: Urology;  Laterality: Right;  2.5 hrs    No Known Allergies  Prior to Admission medications   Medication Sig Start Date End Date Taking? Authorizing Provider  acetaminophen (TYLENOL) 500 MG tablet Take 1,000 mg by mouth every 6 (six) hours as needed for moderate pain or headache.   Yes [provider]  albuterol (VENTOLIN HFA) 108 (90 Base) MCG/ACT inhaler Inhale 1-2 puffs into the lungs every 6 (six) hours as needed for wheezing or shortness of breath.    Yes [provider]  allopurinol (ZYLOPRIM) 300 MG tablet Take 300 mg by mouth every morning.   Yes [provider]  aspirin EC 81 MG tablet Take 81 mg by mouth daily.   Yes [provider]  atorvastatin (LIPITOR) 40 MG tablet Take 1 tablet (40 mg total) by mouth daily. Patient taking differently: Take 40 mg by mouth every evening.  08/16/14  Yes BranchAlphonse Guild, MD  benazepril (LOTENSIN) 20 MG tablet Take 20 mg by mouth every morning.    Yes [provider]  budesonide-formoterol (SYMBICORT) 160-4.5 MCG/ACT inhaler Inhale 2 puffs into the lungs 2 (two) times daily.   Yes [provider]  bumetanide (BUMEX) 1 MG tablet Take 1 mg by mouth See admin instructions. Take 1 mg daily, may take  a second 1 mg dose as needed for swelling   Yes [provider]  carvedilol (COREG) 25 MG tablet Take 1 tablet (25 mg total) by mouth 2 (two) times daily. 08/13/14  Yes BranchAlphonse Guild, MD  Cholecalciferol (VITAMIN D) 50 MCG (2000 UT) tablet Take 2,000 Units by mouth daily.   Yes [provider]  loratadine (CLARITIN) 10 MG tablet Take 10 mg by mouth daily.   Yes [provider]  Multiple Vitamin (MULTIVITAMIN) tablet Take 1 tablet by mouth daily.   Yes [provider]  Tetrahydrozoline HCl (VISINE OP) Place  1 drop into both eyes daily as needed (dryness).   Yes [provider]  diclofenac Sodium (VOLTAREN) 1 % GEL Apply 1 application topically 4 (four) times daily as needed (pain).    [provider]  senna (SENOKOT) 8.6 MG tablet Take 1 tablet by mouth daily as needed for constipation.     [provider]  tiZANidine (ZANAFLEX) 4 MG tablet Take 4 mg by mouth every 8 (eight) hours as needed for muscle spasms.     [provider]    Social History   Socioeconomic History  . Marital status: Married    Spouse name: Not on file  . Number of children: 2  . Years of education: Not on file  . Highest education level: Not on file  Occupational History  . Occupation: retired  Tobacco Use  . Smoking status: Former Smoker    Packs/day: 0.50    Years: 20.00    Pack years: 10.00    Types: Cigarettes    Start date: 02/12/1966    Quit date: 11/08/1984    Years since quitting: 35.5  . Smokeless tobacco: Never Used  Vaping Use  . Vaping Use: Never used  Substance and Sexual Activity  . Alcohol use: No    Alcohol/week: 0.0 standard drinks  . Drug use: No  . Sexual activity: Not on file  Other Topics Concern  . Not on file  Social History Narrative   Full time.    Social Determinants of Health   Financial Resource Strain:   . Difficulty of Paying Living Expenses:   Food Insecurity:   . Worried About Charity fundraiser in the Last Year:   . Arboriculturist in the Last Year:   Transportation Needs:   . Film/video editor (Medical):   Marland Kitchen Lack of Transportation (Non-Medical):   Physical Activity:   . Days of Exercise per Week:   . Minutes of Exercise per Session:   Stress:   . Feeling of Stress :   Social Connections:   . Frequency of Communication with Friends and Family:   . Frequency of Social Gatherings with Friends and Family:   . Attends Religious Services:   . Active Member of Clubs or Organizations:   . Attends Archivist Meetings:    Marland Kitchen Marital Status:   Intimate Partner Violence:   . Fear of Current or Ex-Partner:   . Emotionally Abused:   Marland Kitchen Physically Abused:   . Sexually Abused:      Family History  Problem Relation Age of Onset  . Hypertension Mother   . Kidney disease Mother   . Heart disease Father   . Kidney disease Other     ROS: [x]  Positive   [ ]  Negative   [ ]  All sytems reviewed and are negative  Cardiac: []  chest pain/pressure []  palpitations []  SOB lying flat []   DOE  Vascular: []  pain in legs while walking []  pain in legs at rest []  pain in legs at night []  non-healing ulcers []  hx of DVT []  swelling in legs  Pulmonary: []  productive cough []  asthma/wheezing []  home O2  Neurologic: []  weakness in []  arms []  legs []  numbness in []  arms []  legs []  hx of CVA []  mini stroke [] difficulty speaking or slurred speech []  temporary loss of vision in one eye []  dizziness  Hematologic: []  hx of cancer []  bleeding problems []  problems with blood clotting easily  Endocrine:   []  diabetes []  thyroid disease  GI []  vomiting blood []  blood in stool  GU: []  CKD/renal failure []  HD--[]  M/W/F or []  T/T/S []  burning with urination []  blood in urine  Psychiatric: []  anxiety []  depression  Musculoskeletal: []  arthritis []  joint pain  Integumentary: []  rashes []  ulcers  Constitutional: []  fever []  chills   Physical Examination  Vitals:   06/04/20 0813 06/04/20 0857  BP:  120/68  Pulse:  100  Resp:  18  Temp:  98.6 F (37 C)  SpO2: 98% 96%   Body mass index is 31.7 kg/m.  General:  WDWN in NAD Gait: Not observed HENT: WNL, normocephalic Pulmonary: normal non-labored breathing, without Rales, rhonchi,  wheezing Cardiac: regular rhythym, regular rate Abdomen:  soft, NT/ND, no masses Skin: without rashes Extremities: without ischemic changes, without Gangrene , without cellulitis; without open wounds;  2+ bilateral DP, PT, radial and brachial  pulses Musculoskeletal: no muscle wasting or atrophy  Neurologic: A&O X 3;  No focal weakness or paresthesias are detected; speech is fluent/normal Psychiatric:  The pt has Normal affect.   CBC    Component Value Date/Time   WBC 17.4 (H) 06/04/2020 0434   RBC 2.24 (L) 06/04/2020 0434   HGB 6.8 (LL) 06/04/2020 0434   HCT 21.5 (L) 06/04/2020 0434   PLT 256 06/04/2020 0434   MCV 96.0 06/04/2020 0434   MCH 30.4 06/04/2020 0434   MCHC 31.6 06/04/2020 0434   RDW 18.2 (H) 06/04/2020 0434   LYMPHSABS 0.7 05/27/2020 0309   MONOABS 1.9 (H) 05/27/2020 0309   EOSABS 0.3 05/27/2020 0309   BASOSABS 0.0 05/27/2020 0309    BMET    Component Value Date/Time   NA 134 (L) 06/04/2020 0434   K 4.4 06/04/2020 0434   CL 97 (L) 06/04/2020 0434   CO2 21 (L) 06/04/2020 0434   GLUCOSE 349 (H) 06/04/2020 0434   BUN 89 (H) 06/04/2020 0434   CREATININE 9.32 (H) 06/04/2020 0434   CALCIUM 8.2 (L) 06/04/2020 0434   CALCIUM 8.2 (L) 05/21/2020 0336   GFRNONAA 5 (L) 06/04/2020 0434   GFRAA 6 (L) 06/04/2020 0434    COAGS: Lab Results  Component Value Date   INR 1.1 06/01/2020   INR 1.1 02/26/2020     Non-Invasive Vascular Imaging:      ASSESSMENT/PLAN: This is a 74 y.o. 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  Asked to eval for permanent HD access.  -Will proceed with upper extremity vein mapping.  Plan for left UE AV fistula and TDC placement. -Mulitfactorial anemia without signs of active bleeding>>transfusing another unit PRBCs currently -Dr. Donnetta Hutching, on call vascular surgeon, will assess the patient today and provide further recs and plans.  Risa Grill, PA-C [facility] [phone]  I have examined the patient, reviewed and agree with above.  Discussed plan for hemodialysis access  with the patient.  Currently has a temporary right IJ catheter.  He is right-handed.  Will DC IV from  left arm and restrict left arm.  Vein map pending.  May be able to get him on the schedule for Friday.  Discussed this at length with the patient and his family present  Curt Jews, MD 06/04/2020 5:24 PM

## 2020-06-04 NOTE — Progress Notes (Signed)
19 Days Post-Op   Subjective/Chief Complaint:  Transferred to stepdown yesterday BP stable off pressors.  No iHD yesterday, planned for today Ambulated in the hall today Denies abdominal pain  Objective: Vital signs in last 24 hours: Temp:  [98.6 F (37 C)-100 F (37.8 C)] 99.2 F (37.3 C) (07/28 1440) Pulse Rate:  [79-118] 118 (07/28 1440) Resp:  [18-28] 23 (07/28 1440) BP: (115-136)/(56-80) 136/80 (07/28 1440) SpO2:  [96 %-100 %] 99 % (07/28 1440) Weight:  [939 kg] 112 kg (07/28 0322) Last BM Date: 06/04/20  Intake/Output from previous day: 07/27 0701 - 07/28 0700 In: 1850 [P.O.:480; NG/GT:1370] Out: 0  Intake/Output this shift: Total I/O In: 558 [P.O.:100; Blood:428; NG/GT:30] Out: -   General appearance: Awake, alert  Resp: breathing comfortably on room air Cardio: HR 80s on monitor, regular.  Extremities: edema improving Abd: Obese, soft, much less distended.   Lab Results:  Recent Labs    06/03/20 0157 06/04/20 0434  WBC 14.3* 17.4*  HGB 7.0* 6.8*  HCT 22.6* 21.5*  PLT 243 256   BMET Recent Labs    06/03/20 0157 06/04/20 0434  NA 135 134*  K 4.6 4.4  CL 102 97*  CO2 22 21*  GLUCOSE 240* 349*  BUN 49* 89*  CREATININE 6.46* 9.32*  CALCIUM 8.3* 8.2*   PT/INR No results for input(s): LABPROT, INR in the last 72 hours. ABG No results for input(s): PHART, HCO3 in the last 72 hours.  Invalid input(s): PCO2, PO2  Studies/Results: No results found.  Anti-infectives: Anti-infectives (From admission, onward)   Start     Dose/Rate Route Frequency Ordered Stop   05/27/20 1530  ceFEPIme (MAXIPIME) 2 g in sodium chloride 0.9 % 100 mL IVPB        2 g 200 mL/hr over 30 Minutes Intravenous  Once 05/27/20 1140 05/27/20 1851   05/26/20 2200  ceFEPIme (MAXIPIME) 1 g in sodium chloride 0.9 % 100 mL IVPB  Status:  Discontinued        1 g 200 mL/hr over 30 Minutes Intravenous Every 24 hours 05/25/20 1342 05/27/20 1140   05/22/20 1200  vancomycin  (VANCOREADY) IVPB 1250 mg/250 mL  Status:  Discontinued        1,250 mg 166.7 mL/hr over 90 Minutes Intravenous Every 24 hours 05/21/20 1347 05/24/20 0855   05/22/20 0400  vancomycin (VANCOREADY) IVPB 1250 mg/250 mL  Status:  Discontinued        1,250 mg 166.7 mL/hr over 90 Minutes Intravenous Every 24 hours 05/21/20 0218 05/21/20 1201   05/21/20 1201  vancomycin variable dose per unstable renal function (pharmacist dosing)  Status:  Discontinued         Does not apply See admin instructions 05/21/20 1201 05/22/20 0708   05/21/20 0315  vancomycin (VANCOCIN) 2,500 mg in sodium chloride 0.9 % 500 mL IVPB        2,500 mg 250 mL/hr over 120 Minutes Intravenous  Once 05/21/20 0218 05/21/20 0540   05/21/20 0315  ceFEPIme (MAXIPIME) 2 g in sodium chloride 0.9 % 100 mL IVPB  Status:  Discontinued        2 g 200 mL/hr over 30 Minutes Intravenous Every 12 hours 05/21/20 0218 05/25/20 1342   05/16/20 1700  ceFAZolin (ANCEF) IVPB 1 g/50 mL premix        1 g 100 mL/hr over 30 Minutes Intravenous Every 8 hours 05/16/20 1600 05/17/20 0206   05/16/20 0600  ceFAZolin (ANCEF) 3 g in dextrose 5 % 50  mL IVPB        3 g 100 mL/hr over 30 Minutes Intravenous 30 min pre-op 05/15/20 0725 05/16/20 1944      Assessment/Plan:  1 - RIGHT Renal Neoplasm - s/p nephrectomy. no further cancer directed therapy this admission.    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 Vascular consulted for fistula  3 - Uremic encephalopathy / ICU delirium - resolved, patient alert  4 - Fever / PNA / leukocytosis - WBC increased slightly today, afebrile.    5- Acute Blood Loss Anemia - No evidence of active post operative bleeding.   Agree with PRN transfusion if needed from a cardiovascular/hemodynamic standpoint. Stable from a urologic standpoint to continue DVT ppx.  6 - Constipation/Ileus - Receiving tube feeds via post pyloric corpak, appreciate  nutrition advice  7 - Hypotension - improved, off pressors x 2 days. Remains on midodrine   Carmie Kanner 06/04/2020

## 2020-06-04 NOTE — Care Management Important Message (Signed)
Important Message  Patient Details  Name: Todd Robertson MRN: [id number] Date of Birth: [birth year]   Medicare Important Message Given:  Yes     Shelda Altes 06/04/2020, 10:03 AM

## 2020-06-05 ENCOUNTER — Inpatient Hospital Stay (HOSPITAL_COMMUNITY): Payer: Medicare Other

## 2020-06-05 DIAGNOSIS — N186 End stage renal disease: Secondary | ICD-10-CM

## 2020-06-05 LAB — CBC
HCT: 22.9 % — ABNORMAL LOW (ref 39.0–52.0)
Hemoglobin: 7.2 g/dL — ABNORMAL LOW (ref 13.0–17.0)
MCH: 29.1 pg (ref 26.0–34.0)
MCHC: 31.4 g/dL (ref 30.0–36.0)
MCV: 92.7 fL (ref 80.0–100.0)
Platelets: 242 10*3/uL (ref 150–400)
RBC: 2.47 MIL/uL — ABNORMAL LOW (ref 4.22–5.81)
RDW: 18.6 % — ABNORMAL HIGH (ref 11.5–15.5)
WBC: 15.2 10*3/uL — ABNORMAL HIGH (ref 4.0–10.5)
nRBC: 0 % (ref 0.0–0.2)

## 2020-06-05 LAB — TYPE AND SCREEN
ABO/RH(D): O POS
Antibody Screen: NEGATIVE
Unit division: 0

## 2020-06-05 LAB — COMPREHENSIVE METABOLIC PANEL
ALT: 18 U/L (ref 0–44)
AST: 19 U/L (ref 15–41)
Albumin: 2.2 g/dL — ABNORMAL LOW (ref 3.5–5.0)
Alkaline Phosphatase: 66 U/L (ref 38–126)
Anion gap: 13 (ref 5–15)
BUN: 66 mg/dL — ABNORMAL HIGH (ref 8–23)
CO2: 24 mmol/L (ref 22–32)
Calcium: 8.2 mg/dL — ABNORMAL LOW (ref 8.9–10.3)
Chloride: 96 mmol/L — ABNORMAL LOW (ref 98–111)
Creatinine, Ser: 7.25 mg/dL — ABNORMAL HIGH (ref 0.61–1.24)
GFR calc Af Amer: 8 mL/min — ABNORMAL LOW (ref >60)
GFR calc non Af Amer: 7 mL/min — ABNORMAL LOW (ref >60)
Glucose, Bld: 326 mg/dL — ABNORMAL HIGH (ref 70–99)
Potassium: 4 mmol/L (ref 3.5–5.1)
Sodium: 133 mmol/L — ABNORMAL LOW (ref 135–145)
Total Bilirubin: 0.7 mg/dL (ref 0.3–1.2)
Total Protein: 5.1 g/dL — ABNORMAL LOW (ref 6.5–8.1)

## 2020-06-05 LAB — GLUCOSE, CAPILLARY
Glucose-Capillary: 276 mg/dL — ABNORMAL HIGH (ref 70–99)
Glucose-Capillary: 291 mg/dL — ABNORMAL HIGH (ref 70–99)
Glucose-Capillary: 328 mg/dL — ABNORMAL HIGH (ref 70–99)
Glucose-Capillary: 390 mg/dL — ABNORMAL HIGH (ref 70–99)
Glucose-Capillary: 233 mg/dL — ABNORMAL HIGH (ref 70–99)

## 2020-06-05 LAB — BPAM RBC
Blood Product Expiration Date: 202108242359
ISSUE DATE / TIME: 202107281123
Unit Type and Rh: 5100

## 2020-06-05 LAB — HEPATITIS B SURFACE ANTIBODY, QUANTITATIVE: Hep B S AB Quant (Post): <3.1 m[IU]/mL — ABNORMAL LOW (ref >9.9)

## 2020-06-05 LAB — MAGNESIUM: Magnesium: 2.3 mg/dL (ref 1.7–2.4)

## 2020-06-05 MED ORDER — HYDROCORTISONE NA SUCCINATE PF 100 MG IJ SOLR
50.0000 mg | Freq: Three times a day (TID) | INTRAMUSCULAR | Status: DC
  Administered 2020-06-05 – 2020-06-06 (×2): 50 mg via INTRAVENOUS
  Filled 2020-06-05 (×2): qty 2

## 2020-06-05 MED ORDER — VITAL 1.5 CAL PO LIQD
1190.0000 mL | ORAL | Status: DC
  Administered 2020-06-05 – 2020-06-06 (×2): 1190 mL
  Filled 2020-06-05 (×6): qty 2000

## 2020-06-05 MED ORDER — DARBEPOETIN ALFA 200 MCG/0.4ML IJ SOSY
200.0000 ug | PREFILLED_SYRINGE | INTRAMUSCULAR | Status: DC
  Filled 2020-06-05: qty 0.4

## 2020-06-05 NOTE — Progress Notes (Signed)
VASCULAR LAB PRELIMINARY  PRELIMINARY  PRELIMINARY  PRELIMINARY  Vein mapping completed.    Preliminary report:  See CV proc for preliminary results.  Nicie Milan, RVT 06/05/2020, 12:21 PM

## 2020-06-05 NOTE — Progress Notes (Signed)
°  Progress Note    Will need to not have HD 06/06/20  Vein map pending Plan is for left arm AVF vs graft tomorrow 06/06/20 Pre-op orders written Keep NPO past midnight Consent   Marval Regal [facility] [phone] 06/05/2020 9:51 AM

## 2020-06-05 NOTE — Progress Notes (Signed)
[facility] Progress Note  Interval events/Subjective:   HD yesterday, UF 1.6L, HD cath poorly functional  VVS for AVF/G and [facility] tomorrow  CLIP in process, family req [facility]  [place] to CIR at [place]:   06/05/20 0313 06/05/20 0711 06/05/20 0823 06/05/20 1110  BP: (!) 96/54 (!) 105/62  (!) 102/64  Pulse: 98 88  103  Resp: _0 Temp: 98.1 F (36.7 C) 99.3 F (37.4 C)  97.6 F (36.4 C)  TempSrc: Oral Oral  Oral  SpO2: 96% 96% 99% 92%  Weight: (!) 111.8 kg     Height:        Exam: Gen: nad Heent: +ngt CV: rrr, s1s2, no m/r/g Resp: cta bl, no overt w/r/r/c, normal wob Abd: soft, distension significantly improved Ext: no edema Neuro: awake, following commands, speech clear and coherent Rt temp hd cath (trialysis)    Home meds:  - asa 4m/ lipitor 40 hs/ benazepril 20 am/ bumex 161mqd/ coreg 25 bid  - allopurinol 300 hs  - symbicort ACT 2 puff bid  - prn's/ vitamins/ supplements  Date                             Creat               egFR   2018                          1.79- 2.38        30- 43   2019                          1.80- 1.81        42-43   January 12, 2020           3.00                    May 13, 2020                2.60                 27   Jul 9                           2.51                 29- 33   Jul 10                         3.42, 3.89    UA none yet   CT abd wo contrast 7/9 - Adrenals/Urinary Tract: Post right nephrectomy. Large volume of hemorrhage in the right nephrectomy bed. Ill-defined air fluid collection tracks in the right retroperitoneum anterior to the iliopsoas muscle and laterally in the abdominal cavity. Hemorrhage tracks into the right aspect of the pelvis in the is only partially included in the field of view. There is moderate subcutaneous emphysema involving the right abdominal wall. Small foci of air dissects between the right abdominal wall musculature. Simple cyst in the left kidney. There is no  left hydronephrosis.  CT abd w/ contrast 05/28/2020 1. Slight decrease in size of right retroperitoneal hematoma following right nephrectomy. 2. No enhancing abscess is present. 3. Improved subcutaneous gas over the right side  of the abdomen. 4. Improved bibasilar airspace disease and effusions, right greater than left. 5. Stable moderate dilation of the transverse colon. 6. Stable dilation of the stomach despite OG tube in place 7. Multilevel degenerative changes in the lumbar spine. 8. Aortic Atherosclerosis (ICD10-I70.0).        Recent Labs  Lab 06/03/20 0157 06/03/20 0157 06/04/20 0434 06/04/20 0434 06/04/20 1647 06/05/20 0529  K 4.6   < > 4.4  --   --  4.0  BUN 49*   < > 89*  --   --  66*  CREATININE 6.46*   < > 9.32*  --   --  7.25*  CALCIUM 8.3*   < > 8.2*  --   --  8.2*  PHOS 3.4  --  4.1  --   --   --   HGB 7.0*   < > 6.8*   < > 7.7* 7.2*   < > = values in this interval not displayed.   Inpatient medications: . arformoterol  15 mcg Nebulization BID  . budesonide (PULMICORT) nebulizer solution  0.5 mg Nebulization BID  . chlorhexidine gluconate (MEDLINE KIT)  15 mL Mouth Rinse BID  . Chlorhexidine Gluconate Cloth  6 each Topical Daily  . darbepoetin (ARANESP) injection - NON-DIALYSIS  60 mcg Subcutaneous Q Sat-1800  . feeding supplement (PROSource TF)  90 mL Per Tube BID  . hydrocortisone sod succinate (SOLU-CORTEF) inj  50 mg Intravenous Q6H  . insulin aspart  0-9 Units Subcutaneous Q4H  . insulin aspart  2 Units Subcutaneous Q4H  . insulin detemir  12 Units Subcutaneous QHS  . mouth rinse  15 mL Mouth Rinse q12n4p  . midodrine  15 mg Oral TID WC  . sodium chloride flush  10-40 mL Intracatheter Q12H   . sodium chloride Stopped (05/27/20 0829)  . sodium chloride    . sodium chloride    . sodium chloride    . feeding supplement (VITAL 1.5 CAL) 1,000 mL (06/04/20 1010)   sodium chloride, sodium chloride, sodium chloride, acetaminophen **OR** acetaminophen,  albuterol, diphenhydrAMINE **OR** diphenhydrAMINE, heparin, heparin, lidocaine, loperamide HCl, menthol-cetylpyridinium, ondansetron (ZOFRAN) IV, phenol, promethazine, Resource ThickenUp Clear, sodium chloride flush, tiZANidine   Assessment/ Plan: 1. Dialysis dependent AoCKD IV - b/l creat 2.60, eGFR 27 then s/p nephrectomy with resultant progressive renal insufficiency and oliguria.  Followed by [facility].  He is now ESRD.  Using R IJ Temp HD cath.  Off CRRT 7/18, restarted 7/20-7/26.  1. iHD THS: 2K, 3.5h, 2-3L UF, TDC 400/800, No heparin 2. VVS to place TDC/AVF 7/30 3. CLIP in process as ESRD 2. Renal mass - sp R nephrectomy 05/16/20; neg margins on path 3. Anemia ckd / abl - on Fe and ESA, neg margins on path; Hb town for 1u PRBC today, inc to max ESA 4. Shock, resolved 5. Hyponatremia - resolved 6. Hyperkalemia - resolved, 7. Leukocytosis: persistent, afebrile 8. CKD-BMD: P  And Ca ok  Rexene Agent , MD [facility]

## 2020-06-05 NOTE — H&P (View-Only) (Signed)
  Progress Note    Will need to not have HD 06/06/20  Vein map pending Plan is for left arm AVF vs graft tomorrow 06/06/20 Pre-op orders written Keep NPO past midnight Consent   [facility] [phone] 06/05/2020 9:51 AM

## 2020-06-05 NOTE — Progress Notes (Signed)
[name]:[id number] DOB: 09/11/1946 DOA: 05/16/2020 PCP: Monico Blitz, MD   LOS: 20 days   Brief narrative: As per HPI,  Todd Robertson is a 74 years old male with a history of right renal mass, prostate cancer, osteoarthritis, cardiomyopathy, CKD, diabetes mellitus type 2, gout, hypertension, asthma presented to hospital for elective nephrectomy. 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  He underwent right nephrectomy on 05/16/2020 with retroperitoneal hematoma.  Creatinine on presentation was 2.6.  Postoperatively, patient developed hypotension and patient was transferred to the ICU.  Patient received vasopressors for hypotension.  Patient was subsequently considered medically stable for transfer out of ICU.  Hospitalist team was consulted for medical management.  Assessment/Plan:  Active Problems:   Cancer of kidney [facility])   S/p nephrectomy   Malnutrition of moderate degree   Hypotension Patient was transferred to the ICU after surgical intervention.  Received Solu-Cortef on 7/26.  Was initially on vasopressors which has been off.  On midodrine and Solu-Cortef at this time.  Blood pressure has improved.  will decrease dose of solu-cortef.  Hx of asthma. 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  Intermittent hemodialysis as per nephrology.  Nephrology on board.  S/p Rt nephrectomy for renal cell carcinoma. Management as per primary team.  Anemia postoperative from blood loss plus anemia of chronic disease, retroperitoneal hematoma. Ttransfuse for Hb < 7 or significant bleeding. Status post PRBC I unit on 7/28.  Hemoglobin of 7.2 from 6.8.  Continue to trend hemoglobin.  DM type 2 with hyperglycemia  Continue sliding scale insulin, Accu-Cheks diabetic diet.    Currently on Levemir 12 units at nighttime and sliding scale insulin every 4 hours.  Added NovoLog 2 units every 4 hours on 7/28. POC glucose of  235  Chronic diastolic CHF, HLD. Continue to monitor.  Deconditioning - PT/OT has recommended CIR placement.  Dysphagia. - speech therapy recommending regular diet with thin liquids from assessment on 7/26. Poor oral intake so patient is still on tube feeding.    DVT prophylaxis: SCDs Start: 05/16/20 1601   Code Status: Full code  Family Communication:  None today. Spoke with the patient's daughter on the phone and updated her about the clinical condition of the patient yesterday.  Status is: Inpatient  Consultants: Nephrology Urology PCCM  Procedures: 7/09 Rt nephrectomy 7/13 Rt IJ HD catheter by IR; worsening hypercapnia and AMS >> intubated,  7/14 start CRRT Rt IJ HD catheter 7/13 >> ETT 7/13 >> 7/17 Rt radial a line 7/15 > 7/18   Antibiotics:  Cefepime 7/13 >> 7/20 Vancomycin 7/13 >> 7/17   Subjective: Today, patient was seen and examined at bedside.  Patient denies any shortness of breath, cough, chest pain.   Objective: Vitals:   06/05/20 0313 06/05/20 0711  BP: (!) 96/54 (!) 105/62  Pulse: 98 88  Resp: 20 22  Temp: 98.1 F (36.7 C) 99.3 F (37.4 C)  SpO2: 96% 96%    Intake/Output Summary (Last 24 hours) at 06/05/2020 0739 Last data filed at 06/04/2020 2139 Gross per 24 hour  Intake 658 ml  Output 1550 ml  Net -892 ml   Filed Weights   06/04/20 1840 06/04/20 2139 06/05/20 0313  Weight: (!) 113 kg (!) 111.8 kg (!) 111.8 kg   Body mass index is 31.65 kg/m.   Physical Exam:?? GENERAL: Patient is alert awake and communicative.  Not in obvious distress.  Cortrak tube tube in place.  Obese  HENT: No scleral pallor or icterus. Pupils equally reactive to light. Oral mucosa is moist NECK: is supple, no gross swelling noted.  Right internal jugular hemodialysis catheter in place. CHEST: Clear to auscultation.  Diminished breath sounds bilaterally. CVS: S1 and S2 heard, no murmur. Regular rate and rhythm.  ABDOMEN: Soft, non-tender, bowel sounds  are present.  Obese abdomen. EXTREMITIES: No edema. CNS: Cranial nerves are intact. No focal motor deficits. SKIN: warm and dry without rashes.  Data Review: I have personally reviewed the following laboratory data and studies,  CBC: Recent Labs  Lab 06/02/20 0153 06/02/20 0153 06/02/20 2039 06/03/20 0157 06/04/20 0434 06/04/20 1647 06/05/20 0529  WBC 14.1*  --  12.1* 14.3* 17.4*  --  15.2*  HGB 8.6*   < > 7.2* 7.0* 6.8* 7.7* 7.2*  HCT 28.5*   < > 23.4* 22.6* 21.5* 24.5* 22.9*  MCV 96.3  --  95.1 94.2 96.0  --  92.7  PLT 186  --  209 243 256  --  242   < > = values in this interval not displayed.   Basic Metabolic Panel: Recent Labs  Lab 06/01/20 0232 06/01/20 0232 06/01/20 1545 06/01/20 1545 06/02/20 0153 06/02/20 2039 06/03/20 0157 06/04/20 0434 06/05/20 0529  NA 140   < > 137   < > 139 136 135 134* 133*  K 4.7   < > 4.5   < > 4.4 5.1 4.6 4.4 4.0  CL 103   < > 104   < > 102 103 102 97* 96*  CO2 26   < > 24   < > 26 22 22  21* 24  GLUCOSE 157*   < > 173*   < > 150* 289* 240* 349* 326*  BUN 27*   < > 25*   < > 24* 42* 49* 89* 66*  CREATININE 3.18*   < > 3.40*   < > 3.26* 5.81* 6.46* 9.32* 7.25*  CALCIUM 8.2*   < > 8.2*   < > 8.7* 8.2* 8.3* 8.2* 8.2*  MG 2.5*  --   --   --  2.6* 2.5* 2.6*  --  2.3  PHOS 2.6   < > 2.3*  --  2.3* 3.5 3.4 4.1  --    < > = values in this interval not displayed.   Liver Function Tests: Recent Labs  Lab 06/02/20 0153 06/02/20 2039 06/03/20 0157 06/04/20 0434 06/05/20 0529  AST  --   --   --   --  19  ALT  --   --   --   --  18  ALKPHOS  --   --   --   --  66  BILITOT  --   --   --   --  0.7  PROT  --   --   --   --  5.1*  ALBUMIN 2.7* 2.4* 2.5* 2.4* 2.2*   No results for input(s): LIPASE, AMYLASE in the last 168 hours. No results for input(s): AMMONIA in the last 168 hours. Cardiac Enzymes: No results for input(s): CKTOTAL, CKMB, CKMBINDEX, TROPONINI in the last 168 hours. BNP (last 3 results) No results for input(s): BNP  in the last 8760 hours.  ProBNP (last 3 results) No results for input(s): PROBNP in the last 8760 hours.  CBG: Recent Labs  Lab 06/04/20 1106 06/04/20 1547 06/04/20 2356 06/05/20 0425 06/05/20 0707  GLUCAP 371* 324* 246* 276* 291*   Recent Results (from the past 240 hour(s))  Culture, blood (routine x 2)     Status: None   Collection Time: 05/27/20  3:00 PM   Specimen: BLOOD LEFT HAND  Result Value Ref Range Status   Specimen Description BLOOD LEFT HAND  Final   Special Requests   Final    BOTTLES DRAWN AEROBIC ONLY Blood Culture adequate volume   Culture   Final    NO GROWTH 5 DAYS Performed at [facility], [address], [place], [place] [address]    Report Status 06/01/2020 FINAL  Final  Culture, blood (routine x 2)     Status: None   Collection Time: 05/27/20  3:15 PM   Specimen: BLOOD RIGHT HAND  Result Value Ref Range Status   Specimen Description BLOOD RIGHT HAND  Final   Special Requests IN PEDIATRIC BOTTLE Blood Culture adequate volume  Final   Culture   Final    NO GROWTH 5 DAYS Performed at [facility], [place] [address] [place],  [address]    Report Status 06/01/2020 FINAL  Final     Studies: No results found.    Flora Lipps, MD  [facility] 06/05/2020

## 2020-06-05 NOTE — Progress Notes (Signed)
Nutrition Follow-up  DOCUMENTATION CODES:   Non-severe (moderate) malnutrition in context of acute illness/injury  INTERVENTION:   Transition to nocturnal feeding to promote appetite  -Vital 1.5 @ 85 ml/hr x 14 hours (6pm to 8am) via post pyloric Cortrak -90 ml ProSource BID  Provides: 1945 kcals, 124 grams protein, 909 ml free water. Meets 83% kcal needs and 92% protein needs.   Liberalize REGULAR  NUTRITION DIAGNOSIS:   Moderate Malnutrition related to acute illness (renal faiure requiring CRRT) as evidenced by mild muscle depletion, energy intake < or equal to 50% for > or equal to 5 days.  Ongoing  GOAL:   Patient will meet greater than or equal to 90% of their needs  Addressed via TF  MONITOR:   PO intake, Labs, Weight trends, TF tolerance, Skin, I & O's  REASON FOR ASSESSMENT:   Consult Enteral/tube feeding initiation and management  ASSESSMENT:   74 yo male admitted 7/9 S/P right nephrectomy for large mass. Transferred to the ICU and required intubation on 7/13 due to shock and worsening respiratory failure. PMH includes HTN, cardiomyopathy, DM2, prostate CA, CKD, HLD, gout.  7/14 - CRRT initiated, TF initiated 7/17 - extubated 7/18 - CRRT d/c 7/19 - Cortrak placed, tip gastric per Cortrak team 7/20 - CRRT restarted, s/p FEES and diet advanced to dysphagia 2 with nectar-thick liquids 7/22 - diet advanced to dysphagia 3 with thin liquids 7/23 - NG tube placed for decompression, Cortrak advanced post-pyloric  NG removed. Pt reports feelings of fullness with tube feeding. Denies nausea/vomiting. Having BMs. Requesting to have tube pulled. Discussed that pt will need to increase PO intake prior to removal. Transition to nocturnal feeding to help promote appetite during the day. Pt okay with this plan.   Admission weight: 132 kg  Current weight: 111.8 kg   Medications: solucortef, SS novolog, levemir Labs: Na 133 (L) CBG 246-328  Diet Order:   Diet Order             Diet NPO time specified  Diet effective midnight           Diet NPO time specified Except for: Sips with Meds  Diet effective midnight           Diet heart healthy/carb modified Room service appropriate? Yes; Fluid consistency: Thin  Diet effective now                 EDUCATION NEEDS:   Not appropriate for education at this time  Skin:  Skin Assessment: Skin Integrity Issues: Incisions: closed abdomen, neck, back Other: MASD: coccyx, buttocks   Last BM:  7/28  Height:   Ht Readings from Last 1 Encounters:  06/03/20 6\' 2"  (1.88 m)    Weight:   Wt Readings from Last 1 Encounters:  06/05/20 (!) 111.8 kg    Ideal Body Weight:  86.4 kg  BMI:  Body mass index is 31.65 kg/m.  Estimated Nutritional Needs:   Kcal:  2350-2550  Protein:  120-140 grams  Fluid:  1 L + UOP  Jeralyn Nolden RD, LDN Clinical Nutrition Pager listed in [place]

## 2020-06-05 NOTE — Progress Notes (Signed)
Inpatient Diabetes Program Recommendations  AACE/ADA: New Consensus Statement on Inpatient Glycemic Control (2015)  Target Ranges:  Prepandial:   less than 140 mg/dL      Peak postprandial:   less than 180 mg/dL (1-2 hours)      Critically ill patients:  140 - 180 mg/dL   Lab Results  Component Value Date   GLUCAP 291 (H) 06/05/2020   HGBA1C 6.2 (H) 05/13/2020    Review of Glycemic Control Results for Todd Robertson, Todd Robertson (MRN [id number]) as of 06/05/2020 09:14  Ref. Range 06/04/2020 15:47 06/04/2020 23:56 06/05/2020 04:25 06/05/2020 07:07  Glucose-Capillary Latest Ref Range: 70 - 99 mg/dL 324 (H) 246 (H) 276 (H) 291 (H)   Diabetes history: DM 2 Outpatient Diabetes medications:  None Current orders for Inpatient glycemic control:  Levemir 12 units q HS, Novolog sensitive q 4 hours, Novolog 2 units Q4H Solucortef 50 mg IV q 6 hours  Inpatient Diabetes Program Recommendations:     In the setting of steroids, would consider further increasing Levemir to 18 units QHS. Would decrease Levemir significantly as soon as Steroids are stopped.   Thanks, Bronson Curb, MSN, RNC-OB Diabetes Coordinator [phone] (8a-5p)

## 2020-06-06 ENCOUNTER — Inpatient Hospital Stay (HOSPITAL_COMMUNITY): Payer: Medicare Other

## 2020-06-06 ENCOUNTER — Encounter (HOSPITAL_COMMUNITY): Payer: Self-pay | Admitting: Urology

## 2020-06-06 ENCOUNTER — Inpatient Hospital Stay (HOSPITAL_COMMUNITY): Payer: Medicare Other | Admitting: Anesthesiology

## 2020-06-06 ENCOUNTER — Encounter (HOSPITAL_COMMUNITY)
Admission: RE | Disposition: A | Discharge status: Rehabilitation Facility | Payer: Self-pay | Source: Home / Self Care | Attending: Urology

## 2020-06-06 DIAGNOSIS — E7800 Pure hypercholesterolemia, unspecified: Secondary | ICD-10-CM | POA: Diagnosis not present

## 2020-06-06 DIAGNOSIS — M159 Polyosteoarthritis, unspecified: Secondary | ICD-10-CM | POA: Diagnosis not present

## 2020-06-06 DIAGNOSIS — I10 Essential (primary) hypertension: Secondary | ICD-10-CM | POA: Diagnosis not present

## 2020-06-06 HISTORY — PX: AV FISTULA PLACEMENT: SHX1204

## 2020-06-06 HISTORY — PX: INSERTION OF DIALYSIS CATHETER: SHX1324

## 2020-06-06 LAB — CBC
HCT: 24 % — ABNORMAL LOW (ref 39.0–52.0)
Hemoglobin: 7.7 g/dL — ABNORMAL LOW (ref 13.0–17.0)
MCH: 30.2 pg (ref 26.0–34.0)
MCHC: 32.1 g/dL (ref 30.0–36.0)
MCV: 94.1 fL (ref 80.0–100.0)
Platelets: 300 10*3/uL (ref 150–400)
RBC: 2.55 MIL/uL — ABNORMAL LOW (ref 4.22–5.81)
RDW: 18.6 % — ABNORMAL HIGH (ref 11.5–15.5)
WBC: 13.4 10*3/uL — ABNORMAL HIGH (ref 4.0–10.5)
nRBC: 0 % (ref 0.0–0.2)

## 2020-06-06 LAB — BASIC METABOLIC PANEL
Anion gap: 16 — ABNORMAL HIGH (ref 5–15)
BUN: 100 mg/dL — ABNORMAL HIGH (ref 8–23)
CO2: 21 mmol/L — ABNORMAL LOW (ref 22–32)
Calcium: 8.4 mg/dL — ABNORMAL LOW (ref 8.9–10.3)
Chloride: 96 mmol/L — ABNORMAL LOW (ref 98–111)
Creatinine, Ser: 8.92 mg/dL — ABNORMAL HIGH (ref 0.61–1.24)
GFR calc Af Amer: 6 mL/min — ABNORMAL LOW (ref >60)
GFR calc non Af Amer: 5 mL/min — ABNORMAL LOW (ref >60)
Glucose, Bld: 344 mg/dL — ABNORMAL HIGH (ref 70–99)
Potassium: 4.8 mmol/L (ref 3.5–5.1)
Sodium: 133 mmol/L — ABNORMAL LOW (ref 135–145)

## 2020-06-06 LAB — GLUCOSE, CAPILLARY
Glucose-Capillary: 174 mg/dL — ABNORMAL HIGH (ref 70–99)
Glucose-Capillary: 189 mg/dL — ABNORMAL HIGH (ref 70–99)
Glucose-Capillary: 217 mg/dL — ABNORMAL HIGH (ref 70–99)
Glucose-Capillary: 246 mg/dL — ABNORMAL HIGH (ref 70–99)
Glucose-Capillary: 296 mg/dL — ABNORMAL HIGH (ref 70–99)
Glucose-Capillary: 320 mg/dL — ABNORMAL HIGH (ref 70–99)
Glucose-Capillary: 337 mg/dL — ABNORMAL HIGH (ref 70–99)

## 2020-06-06 SURGERY — INSERTION OF DIALYSIS CATHETER
Anesthesia: Monitor Anesthesia Care | Laterality: Right

## 2020-06-06 MED ORDER — ONDANSETRON HCL 4 MG/2ML IJ SOLN
INTRAMUSCULAR | Status: DC | PRN
  Administered 2020-06-06: 4 mg via INTRAVENOUS

## 2020-06-06 MED ORDER — MIDODRINE HCL 5 MG PO TABS
10.0000 mg | ORAL_TABLET | Freq: Three times a day (TID) | ORAL | Status: DC
  Administered 2020-06-06 (×2): 10 mg via ORAL
  Filled 2020-06-06 (×3): qty 2

## 2020-06-06 MED ORDER — HYDROCORTISONE NA SUCCINATE PF 100 MG IJ SOLR
50.0000 mg | Freq: Two times a day (BID) | INTRAMUSCULAR | Status: DC
  Administered 2020-06-06 (×2): 50 mg via INTRAVENOUS
  Filled 2020-06-06 (×2): qty 2

## 2020-06-06 MED ORDER — CEFAZOLIN SODIUM-DEXTROSE 2-3 GM-%(50ML) IV SOLR
INTRAVENOUS | Status: DC | PRN
Start: 2020-06-06 — End: 2020-06-06
  Administered 2020-06-06: 2 g via INTRAVENOUS

## 2020-06-06 MED ORDER — SODIUM CHLORIDE 0.9 % IV SOLN
INTRAVENOUS | Status: DC | PRN
  Administered 2020-06-06: 500 mL

## 2020-06-06 MED ORDER — ACETAMINOPHEN 10 MG/ML IV SOLN
INTRAVENOUS | Status: DC | PRN
Start: 2020-06-06 — End: 2020-06-06
  Administered 2020-06-06: 1000 mg via INTRAVENOUS

## 2020-06-06 MED ORDER — HEPARIN SODIUM (PORCINE) 1000 UNIT/ML IJ SOLN
INTRAMUSCULAR | Status: DC | PRN
  Administered 2020-06-06: 3800 [IU] via INTRA_ARTERIAL

## 2020-06-06 MED ORDER — FENTANYL CITRATE (PF) 100 MCG/2ML IJ SOLN: 25.0000 ug | INTRAMUSCULAR | Status: DC | PRN

## 2020-06-06 MED ORDER — MIDAZOLAM HCL 5 MG/5ML IJ SOLN
INTRAMUSCULAR | Status: DC | PRN
  Administered 2020-06-06 (×2): 1 mg via INTRAVENOUS

## 2020-06-06 MED ORDER — PROPOFOL 10 MG/ML IV BOLUS
INTRAVENOUS | Status: AC
  Filled 2020-06-06: qty 20

## 2020-06-06 MED ORDER — HEPARIN SODIUM (PORCINE) 1000 UNIT/ML IJ SOLN
INTRAMUSCULAR | Status: AC
  Filled 2020-06-06: qty 1

## 2020-06-06 MED ORDER — PROPOFOL 500 MG/50ML IV EMUL
INTRAVENOUS | Status: DC | PRN
  Administered 2020-06-06: 50 ug/kg/min via INTRAVENOUS

## 2020-06-06 MED ORDER — INSULIN ASPART 100 UNIT/ML ~~LOC~~ SOLN
4.0000 [IU] | SUBCUTANEOUS | Status: DC
  Administered 2020-06-06 – 2020-06-08 (×6): 4 [IU] via SUBCUTANEOUS

## 2020-06-06 MED ORDER — LIDOCAINE-EPINEPHRINE 0.5 %-1:200000 IJ SOLN
INTRAMUSCULAR | Status: DC | PRN
  Administered 2020-06-06: 50 mL

## 2020-06-06 MED ORDER — 0.9 % SODIUM CHLORIDE (POUR BTL) OPTIME
TOPICAL | Status: DC | PRN
  Administered 2020-06-06: 1000 mL

## 2020-06-06 MED ORDER — ACETAMINOPHEN 500 MG PO TABS
1000.0000 mg | ORAL_TABLET | Freq: Once | ORAL | Status: DC
  Filled 2020-06-06: qty 2

## 2020-06-06 MED ORDER — INSULIN DETEMIR 100 UNIT/ML ~~LOC~~ SOLN
18.0000 [IU] | Freq: Every day | SUBCUTANEOUS | Status: DC
  Administered 2020-06-06 – 2020-06-07 (×2): 18 [IU] via SUBCUTANEOUS
  Filled 2020-06-06 (×3): qty 0.18

## 2020-06-06 MED ORDER — FENTANYL CITRATE (PF) 100 MCG/2ML IJ SOLN
INTRAMUSCULAR | Status: DC | PRN
  Administered 2020-06-06: 25 ug via INTRAVENOUS

## 2020-06-06 MED ORDER — CELECOXIB 200 MG PO CAPS
200.0000 mg | ORAL_CAPSULE | Freq: Once | ORAL | Status: DC
  Filled 2020-06-06: qty 1

## 2020-06-06 MED ORDER — PROMETHAZINE HCL 25 MG/ML IJ SOLN: 6.2500 mg | INTRAMUSCULAR | Status: DC | PRN

## 2020-06-06 MED ORDER — SODIUM CHLORIDE 0.9 % IV SOLN
INTRAVENOUS | Status: AC
  Filled 2020-06-06: qty 1.2

## 2020-06-06 MED ORDER — ACETAMINOPHEN 10 MG/ML IV SOLN
INTRAVENOUS | Status: AC
  Filled 2020-06-06: qty 100

## 2020-06-06 MED ORDER — LIDOCAINE 2% (20 MG/ML) 5 ML SYRINGE
INTRAMUSCULAR | Status: AC
  Filled 2020-06-06: qty 5

## 2020-06-06 MED ORDER — FENTANYL CITRATE (PF) 250 MCG/5ML IJ SOLN
INTRAMUSCULAR | Status: AC
  Filled 2020-06-06: qty 5

## 2020-06-06 MED ORDER — MIDAZOLAM HCL 2 MG/2ML IJ SOLN
INTRAMUSCULAR | Status: AC
  Filled 2020-06-06: qty 2

## 2020-06-06 MED ORDER — LIDOCAINE-EPINEPHRINE 0.5 %-1:200000 IJ SOLN
INTRAMUSCULAR | Status: AC
  Filled 2020-06-06: qty 1

## 2020-06-06 MED ORDER — PROPOFOL 10 MG/ML IV BOLUS
INTRAVENOUS | Status: DC | PRN
  Administered 2020-06-06 (×4): 10 mg via INTRAVENOUS

## 2020-06-06 SURGICAL SUPPLY — 56 items
ADH SKN CLS APL DERMABOND .7 (GAUZE/BANDAGES/DRESSINGS) ×2
ARMBAND PINK RESTRICT EXTREMIT (MISCELLANEOUS) ×6 IMPLANT
BAG DECANTER FOR FLEXI CONT (MISCELLANEOUS) ×3 IMPLANT
BIOPATCH RED 1 DISK 7.0 (GAUZE/BANDAGES/DRESSINGS) ×3 IMPLANT
CANISTER SUCT 3000ML PPV (MISCELLANEOUS) ×3 IMPLANT
CANNULA VESSEL 3MM 2 BLNT TIP (CANNULA) ×4 IMPLANT
CATH PALINDROME-P 23CM W/VT (CATHETERS) ×1 IMPLANT
CLIP LIGATING EXTRA MED SLVR (CLIP) ×3 IMPLANT
CLIP LIGATING EXTRA SM BLUE (MISCELLANEOUS) ×3 IMPLANT
COVER PROBE W GEL 5X96 (DRAPES) ×3 IMPLANT
COVER SURGICAL LIGHT HANDLE (MISCELLANEOUS) ×3 IMPLANT
COVER WAND RF STERILE (DRAPES) ×3 IMPLANT
DECANTER SPIKE VIAL GLASS SM (MISCELLANEOUS) ×3 IMPLANT
DERMABOND ADVANCED (GAUZE/BANDAGES/DRESSINGS) ×1
DERMABOND ADVANCED .7 DNX12 (GAUZE/BANDAGES/DRESSINGS) ×2 IMPLANT
DRAPE C-ARM 42X72 X-RAY (DRAPES) ×3 IMPLANT
DRAPE CHEST BREAST 15X10 FENES (DRAPES) ×3 IMPLANT
ELECT REM PT RETURN 9FT ADLT (ELECTROSURGICAL) ×3
ELECTRODE REM PT RTRN 9FT ADLT (ELECTROSURGICAL) ×2 IMPLANT
GAUZE 4X4 16PLY RFD (DISPOSABLE) ×3 IMPLANT
GLOVE BIOGEL PI IND STRL 6 (GLOVE) IMPLANT
GLOVE BIOGEL PI IND STRL 6.5 (GLOVE) IMPLANT
GLOVE BIOGEL PI INDICATOR 6 (GLOVE) ×1
GLOVE BIOGEL PI INDICATOR 6.5 (GLOVE) ×1
GLOVE SS BIOGEL STRL SZ 7.5 (GLOVE) ×2 IMPLANT
GLOVE SUPERSENSE BIOGEL SZ 7.5 (GLOVE) ×1
GLOVE SURG SS PI 6.5 STRL IVOR (GLOVE) ×1 IMPLANT
GOWN STRL NON-REIN LRG LVL3 (GOWN DISPOSABLE) ×1 IMPLANT
GOWN STRL REUS W/ TWL LRG LVL3 (GOWN DISPOSABLE) ×6 IMPLANT
GOWN STRL REUS W/TWL LRG LVL3 (GOWN DISPOSABLE) ×9
GRAFT GORETEX STRT 4-7X45 (Vascular Products) ×1 IMPLANT
KIT BASIN OR (CUSTOM PROCEDURE TRAY) ×3 IMPLANT
KIT TURNOVER KIT B (KITS) ×3 IMPLANT
NDL 18GX1X1/2 (RX/OR ONLY) (NEEDLE) ×2 IMPLANT
NDL HYPO 25GX1X1/2 BEV (NEEDLE) ×2 IMPLANT
NEEDLE 18GX1X1/2 (RX/OR ONLY) (NEEDLE) ×3 IMPLANT
NEEDLE HYPO 25GX1X1/2 BEV (NEEDLE) ×3 IMPLANT
NS IRRIG 1000ML POUR BTL (IV SOLUTION) ×3 IMPLANT
PACK CV ACCESS (CUSTOM PROCEDURE TRAY) ×3 IMPLANT
PACK SURGICAL SETUP 50X90 (CUSTOM PROCEDURE TRAY) ×3 IMPLANT
PAD ARMBOARD 7.5X6 YLW CONV (MISCELLANEOUS) ×6 IMPLANT
SOAP 2 % CHG 4 OZ (WOUND CARE) ×3 IMPLANT
SUT ETHILON 3 0 PS 1 (SUTURE) ×4 IMPLANT
SUT PROLENE 6 0 CC (SUTURE) ×5 IMPLANT
SUT VIC AB 3-0 SH 27 (SUTURE) ×9
SUT VIC AB 3-0 SH 27X BRD (SUTURE) ×2 IMPLANT
SUT VIC AB 4-0 PS2 18 (SUTURE) ×1 IMPLANT
SUT VICRYL 4-0 PS2 18IN ABS (SUTURE) ×3 IMPLANT
SYR 10ML LL (SYRINGE) ×3 IMPLANT
SYR 20ML LL LF (SYRINGE) ×3 IMPLANT
SYR 5ML LL (SYRINGE) ×6 IMPLANT
SYR CONTROL 10ML LL (SYRINGE) ×3 IMPLANT
TOWEL GREEN STERILE (TOWEL DISPOSABLE) ×6 IMPLANT
TOWEL GREEN STERILE FF (TOWEL DISPOSABLE) ×3 IMPLANT
UNDERPAD 30X36 HEAVY ABSORB (UNDERPADS AND DIAPERS) ×3 IMPLANT
WATER STERILE IRR 1000ML POUR (IV SOLUTION) ×3 IMPLANT

## 2020-06-06 NOTE — Progress Notes (Signed)
PROGRESS NOTE  Todd Robertson:[id number] DOB: 07/08/1946 DOA: 05/16/2020 PCP: Monico Blitz, MD   LOS: 21 days   Brief narrative: As per HPI,  [name] is a 74 years old male with a history of right renal mass, prostate cancer, osteoarthritis, cardiomyopathy, CKD, diabetes mellitus type 2, gout, hypertension, asthma presented to hospital for elective nephrectomy. 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  He underwent right nephrectomy on 05/16/2020 with retroperitoneal hematoma.  Creatinine on presentation was 2.6.  Postoperatively, patient developed hypotension and patient was transferred to the ICU.  Patient received vasopressors for hypotension.  Patient was subsequently considered medically stable for transfer out of ICU.  Hospitalist team was consulted for medical management.  Assessment/Plan:  Active Problems:   Cancer of kidney ([place])   S/p nephrectomy   Malnutrition of moderate degree  Hypotension Improved. Patient off vasopressors .  On midodrine and Solu-Cortef at this time.  Blood pressure has improved. decrease midodrine to 10mg  PO TID. Decrease solucortef to 50mg  BID.  Hx of asthma. 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  Intermittent hemodialysis as per nephrology.  Nephrology on board.  S/p Rt nephrectomy for renal cell carcinoma. Management as per primary team.  Anemia postoperative from blood loss plus anemia of chronic disease, retroperitoneal hematoma. Ttransfuse for Hb < 7 or significant bleeding. Status post PRBC I unit on 7/28.  Hemoglobin of 7.7< 7.2< 6.8.  Continue to trend hemoglobin.  DM type 2 with hyperglycemia  Continue sliding scale insulin, Accu-Cheks diabetic diet.    Currently on Levemir 12 units at nighttime and sliding scale insulin every 4 hours.  Added NovoLog 2 units every 4 hours on 7/28. Will change to 4 units q4hr. Decrease solucortef. increase levemir 18 at night time. POC glucose of  751  Chronic diastolic CHF, HLD. Continue to monitor.compensated.  Deconditioning - PT/OT has recommended CIR placement.  Dysphagia. - speech therapy recommending regular diet with thin liquids from assessment on 7/26. Poor oral intake so patient is still on tube feeding as per dietician.  Disposition plan.  CIR.    DVT prophylaxis: SCDs Start: 05/16/20 1601   Code Status: Full code  Family Communication: None today.   Consultants: Nephrology Urology PCCM  Procedures: 7/09 Rt nephrectomy 7/13 Rt IJ HD catheter by IR; worsening hypercapnia and AMS >> intubated,  7/14 start CRRT Rt IJ HD catheter 7/13 >> ETT 7/13 >> 7/17 Rt radial a line 7/15 > 7/18  Antibiotics:  Cefepime 7/13 >> 7/20 Vancomycin 7/13 >> 7/17   Subjective: Today, patient was seen and examined at bedside.  Patient denies any shortness of breath, cough, chest pain, nausea or vomiting.  Has had a bowel movement.  Objective: Vitals:   06/06/20 0002 06/06/20 0507  BP: (!) 129/80 (!) 122/64  Pulse: 95 87  Resp: 21 19  Temp: 97.6 F (36.4 C) 98.2 F (36.8 C)  SpO2: 98% 97%    Intake/Output Summary (Last 24 hours) at 06/06/2020 0730 Last data filed at 06/05/2020 1300 Gross per 24 hour  Intake 240 ml  Output --  Net 240 ml   Filed Weights   06/04/20 2139 06/05/20 0313 06/06/20 0507  Weight: (!) 111.8 kg (!) 111.8 kg (!) 112.5 kg   Body mass index is 31.84 kg/m.   Physical Exam: GENERAL: Patient is alert awake and communicative.  Not in obvious distress.  Cortrak tube tube in place.  Obese HENT: No scleral pallor or icterus. Pupils equally reactive  to light. Oral mucosa is moist NECK: is supple, no gross swelling noted.  Right internal jugular hemodialysis catheter in place. CHEST:   Diminished breath sounds bilaterally. CVS: S1 and S2 heard, no murmur. Regular rate and rhythm.  ABDOMEN: Soft, non-tender, bowel sounds are present.  Obese abdomen. EXTREMITIES: Trace bilateral lower  extremity edema CNS: Cranial nerves are intact. No focal motor deficits. SKIN: warm and dry without rashes.  Data Review: I have personally reviewed the following laboratory data and studies,  CBC: Recent Labs  Lab 06/02/20 2039 06/02/20 2039 06/03/20 0157 06/04/20 0434 06/04/20 1647 06/05/20 0529 06/06/20 0418  WBC 12.1*  --  14.3* 17.4*  --  15.2* 13.4*  HGB 7.2*   < > 7.0* 6.8* 7.7* 7.2* 7.7*  HCT 23.4*   < > 22.6* 21.5* 24.5* 22.9* 24.0*  MCV 95.1  --  94.2 96.0  --  92.7 94.1  PLT 209  --  243 256  --  242 300   < > = values in this interval not displayed.   Basic Metabolic Panel: Recent Labs  Lab 06/01/20 0232 06/01/20 0232 06/01/20 1545 06/01/20 1545 06/02/20 0153 06/02/20 0153 06/02/20 2039 06/03/20 0157 06/04/20 0434 06/05/20 0529 06/06/20 0418  NA 140   < > 137   < > 139   < > 136 135 134* 133* 133*  K 4.7   < > 4.5   < > 4.4   < > 5.1 4.6 4.4 4.0 4.8  CL 103   < > 104   < > 102   < > 103 102 97* 96* 96*  CO2 26   < > 24   < > 26   < > 22 22 21* 24 21*  GLUCOSE 157*   < > 173*   < > 150*   < > 289* 240* 349* 326* 344*  BUN 27*   < > 25*   < > 24*   < > 42* 49* 89* 66* 100*  CREATININE 3.18*   < > 3.40*   < > 3.26*   < > 5.81* 6.46* 9.32* 7.25* 8.92*  CALCIUM 8.2*   < > 8.2*   < > 8.7*   < > 8.2* 8.3* 8.2* 8.2* 8.4*  MG 2.5*  --   --   --  2.6*  --  2.5* 2.6*  --  2.3  --   PHOS 2.6   < > 2.3*  --  2.3*  --  3.5 3.4 4.1  --   --    < > = values in this interval not displayed.   Liver Function Tests: Recent Labs  Lab 06/02/20 0153 06/02/20 2039 06/03/20 0157 06/04/20 0434 06/05/20 0529  AST  --   --   --   --  19  ALT  --   --   --   --  18  ALKPHOS  --   --   --   --  66  BILITOT  --   --   --   --  0.7  PROT  --   --   --   --  5.1*  ALBUMIN 2.7* 2.4* 2.5* 2.4* 2.2*   No results for input(s): LIPASE, AMYLASE in the last 168 hours. No results for input(s): AMMONIA in the last 168 hours. Cardiac Enzymes: No results for input(s): CKTOTAL,  CKMB, CKMBINDEX, TROPONINI in the last 168 hours. BNP (last 3 results) No results for input(s): BNP in the last 8760 hours.  ProBNP (last 3 results) No results for input(s): PROBNP in the last 8760 hours.  CBG: Recent Labs  Lab 06/05/20 1053 06/05/20 1604 06/05/20 2024 06/06/20 0006 06/06/20 0432  GLUCAP 328* 390* 233* 296* 320*   Recent Results (from the past 240 hour(s))  Culture, blood (routine x 2)     Status: None   Collection Time: 05/27/20  3:00 PM   Specimen: BLOOD LEFT HAND  Result Value Ref Range Status   Specimen Description BLOOD LEFT HAND  Final   Special Requests   Final    BOTTLES DRAWN AEROBIC ONLY Blood Culture adequate volume   Culture   Final    NO GROWTH 5 DAYS Performed at [facility], [place] [address] [place], [place] [address]    Report Status 06/01/2020 FINAL  Final  Culture, blood (routine x 2)     Status: None   Collection Time: 05/27/20  3:15 PM   Specimen: BLOOD RIGHT HAND  Result Value Ref Range Status   Specimen Description BLOOD RIGHT HAND  Final   Special Requests IN PEDIATRIC BOTTLE Blood Culture adequate volume  Final   Culture   Final    NO GROWTH 5 DAYS Performed at [facility], [place] [address], [place], [place] [address]    Report Status 06/01/2020 FINAL  Final     Studies: VAS [place] UPPER EXT VEIN MAPPING (PRE-OP AVF)  Result Date: 06/05/2020 UPPER EXTREMITY VEIN MAPPING  Indications: Pre-access. Limitations: body habitus Comparison Study: No prior study Performing Technologist: Sharion Dove RVS  Examination Guidelines: A complete evaluation includes B-mode imaging, spectral Doppler, color Doppler, and power Doppler as needed of all accessible portions of each vessel. Bilateral testing is considered an integral part of a complete examination. Limited examinations for reoccurring indications may be performed as noted. +-----------------+-------------+----------+---------+ Right Cephalic   Diameter (cm)Depth (cm)Findings   +-----------------+-------------+----------+---------+ Prox upper arm       0.31        0.28   thrombus  +-----------------+-------------+----------+---------+ Mid upper arm        0.46        0.30   thrombus  +-----------------+-------------+----------+---------+ Dist upper arm       0.49        0.26   thrombus  +-----------------+-------------+----------+---------+ Antecubital fossa    0.69        0.25   thrombus  +-----------------+-------------+----------+---------+ Prox forearm         0.23        0.20   thrombus  +-----------------+-------------+----------+---------+ Mid forearm          0.15        0.30   branching +-----------------+-------------+----------+---------+ Wrist                0.30        0.22   thrombus  +-----------------+-------------+----------+---------+ +-----------------+-------------+----------+--------+ Right Basilic    Diameter (cm)Depth (cm)Findings +-----------------+-------------+----------+--------+ Dist upper arm       0.20        0.58            +-----------------+-------------+----------+--------+ Antecubital fossa    0.21        0.34            +-----------------+-------------+----------+--------+ Distal forearm       0.14        0.60            +-----------------+-------------+----------+--------+ +-----------------+-------------+----------+---------+ Left Cephalic    Diameter (cm)Depth (cm)Findings  +-----------------+-------------+----------+---------+ Prox upper arm  0.28        0.33   branching +-----------------+-------------+----------+---------+ Mid upper arm        0.14        0.43             +-----------------+-------------+----------+---------+ Dist upper arm       0.24        0.32             +-----------------+-------------+----------+---------+ Antecubital fossa    0.36        0.19   thrombus  +-----------------+-------------+----------+---------+ Prox forearm         0.24         0.23             +-----------------+-------------+----------+---------+ Mid forearm          0.25        0.33             +-----------------+-------------+----------+---------+ Wrist                0.14        0.18             +-----------------+-------------+----------+---------+ +-----------------+-------------+----------+---------+ Left Basilic     Diameter (cm)Depth (cm)Findings  +-----------------+-------------+----------+---------+ Mid upper arm        0.40        1.55    origin   +-----------------+-------------+----------+---------+ Dist upper arm       0.39        1.89             +-----------------+-------------+----------+---------+ Antecubital fossa    0.38        1.26             +-----------------+-------------+----------+---------+ Prox forearm         0.21        0.28             +-----------------+-------------+----------+---------+ Mid forearm          0.15        0.18   branching +-----------------+-------------+----------+---------+ *See table(s) above for measurements and observations.  Diagnosing physician: Monica Martinez MD Electronically signed by Monica Martinez MD on 06/05/2020 at 3:51:19 PM.    Final       Flora Lipps, MD  [facility] 06/06/2020

## 2020-06-06 NOTE — Progress Notes (Signed)
Inpatient Rehabilitation-Admissions Coordinator   Noted pt has plans for [facility] and AVF/G today. CLIP in process. My coworker will follow up this weekend for possible admit vs Monday, pending need, readiness, and bed availability.   Raechel Ache, OTR/L  Rehab Admissions Coordinator  [phone] 06/06/2020 4:00 PM

## 2020-06-06 NOTE — Transfer of Care (Signed)
Immediate Anesthesia Transfer of Care Note  Patient: MALCOMB GANGEMI  Procedure(s) Performed: INSERTION OF DIALYSIS CATHETER USING 23CM DOUBLE LUMEN CATHETER (Right ) LEFT ARM ARTERIOVENOUS (AV) GRAFT USING 45CM GORTEX (Left )  Patient Location: PACU  Anesthesia Type:MAC  Level of Consciousness: oriented, drowsy and patient cooperative  Airway & Oxygen Therapy: Patient Spontanous Breathing and Patient connected to nasal cannula oxygen  Post-op Assessment: Report given to RN and Post -op Vital signs reviewed and stable  Post vital signs: Reviewed  Last Vitals:  Vitals Value Taken Time  BP 161/72 06/06/20 1724  Temp    Pulse 94 06/06/20 1728  Resp 21 06/06/20 1728  SpO2 100 % 06/06/20 1728  Vitals shown include unvalidated device data.  Last Pain:  Vitals:   06/06/20 1137  TempSrc: Oral  PainSc:       Patients Stated Pain Goal: 0 (58/52/77 8242)  Complications: No complications documented.

## 2020-06-06 NOTE — Anesthesia Preprocedure Evaluation (Addendum)
Anesthesia Evaluation  Patient identified by MRN, date of birth, ID band Patient awake    Reviewed: Allergy & Precautions, NPO status , Patient's Chart, lab work & pertinent test results, reviewed documented beta blocker date and time   History of Anesthesia Complications (+) DIFFICULT AIRWAYNegative for: history of anesthetic complications  Airway Mallampati: I  TM Distance: >3 FB Neck ROM: Full    Dental  (+) Edentulous Upper, Dental Advisory Given, Missing, Partial Lower   Pulmonary COPD,  COPD inhaler, former smoker,    Pulmonary exam normal        Cardiovascular hypertension, Pt. on medications and Pt. on home beta blockers +CHF  Normal cardiovascular exam  IMPRESSIONS   1. Left ventricular ejection fraction, by estimation, is 45 to 50%. The left ventricle has mildly decreased function. The left ventricle demonstrates global hypokinesis. There is mild left ventricular hypertrophy. Left ventricular diastolic parameters  are consistent with Grade II diastolic dysfunction (pseudonormalization). 2. Right ventricular systolic function is normal. The right ventricular size is mildly enlarged. Tricuspid regurgitation signal is inadequate for assessing PA pressure. 3. Right atrial size was mildly dilated. 4. The mitral valve is normal in structure. No evidence of mitral valve regurgitation. No evidence of mitral stenosis. 5. The aortic valve is tricuspid. Aortic valve regurgitation is not visualized. Mild aortic valve sclerosis is present, with no evidence of aortic valve stenosis. 6. Aortic dilatation noted. 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 7. Technically difficult study with poor images   Neuro/Psych negative neurological ROS  negative psych ROS   GI/Hepatic negative GI ROS, Neg liver ROS,   Endo/Other  diabetes, Type 2  Renal/GU CRFRenal diseaseS/P nephrectomy      Musculoskeletal Gout   Abdominal (+) + obese,   Peds  Hematology negative hematology ROS (+)   Anesthesia Other Findings   Reproductive/Obstetrics                          Anesthesia Physical  Anesthesia Plan  ASA: III  Anesthesia Plan: General and MAC   Post-op Pain Management:    Induction:   PONV Risk Score and Plan: 3 and Dexamethasone, Treatment may vary due to age or medical condition, Ondansetron and Propofol infusion  Airway Management Planned: Natural Airway  Additional Equipment:   Intra-op Plan:   Post-operative Plan:   Informed Consent: I have reviewed the patients History and Physical, chart, labs and discussed the procedure including the risks, benefits and alternatives for the proposed anesthesia with the patient or authorized representative who has indicated his/her understanding and acceptance.     Dental advisory given  Plan Discussed with: CRNA and Anesthesiologist  Anesthesia Plan Comments:        Anesthesia Quick Evaluation

## 2020-06-06 NOTE — Progress Notes (Signed)
21 Days Post-Op Subjective: Patient reports good pain control, ambulating in halls. He is scheduled for fistula today  Objective: Vital signs in last 24 hours: Temp:  [97.6 F (36.4 C)-98.6 F (37 C)] 98.2 F (36.8 C) (07/30 0507) Pulse Rate:  [86-103] 87 (07/30 0507) Resp:  [17-30] 19 (07/30 0507) BP: (102-142)/(64-80) 122/64 (07/30 0507) SpO2:  [92 %-100 %] 97 % (07/30 0507) Weight:  [112.5 kg] 112.5 kg (07/30 0507)  Intake/Output from previous day: 07/29 0701 - 07/30 0700 In: 240 [P.O.:240] Out: -  Intake/Output this shift: No intake/output data recorded.  Physical Exam:  General:alert, cooperative and appears stated age GI: soft, non tender, normal bowel sounds, no palpable masses, no organomegaly, no inguinal hernia Male genitalia: not done Extremities: extremities normal, atraumatic, no cyanosis or edema  Lab Results: Recent Labs    06/04/20 1647 06/05/20 0529 06/06/20 0418  HGB 7.7* 7.2* 7.7*  HCT 24.5* 22.9* 24.0*   BMET Recent Labs    06/05/20 0529 06/06/20 0418  NA 133* 133*  K 4.0 4.8  CL 96* 96*  CO2 24 21*  GLUCOSE 326* 344*  BUN 66* 100*  CREATININE 7.25* 8.92*  CALCIUM 8.2* 8.4*   No results for input(s): LABPT, INR in the last 72 hours. No results for input(s): LABURIN in the last 72 hours. Results for orders placed or performed during the hospital encounter of 05/16/20  MRSA PCR Screening     Status: None   Collection Time: 05/16/20  4:34 PM   Specimen: Nasal Mucosa; Nasopharyngeal  Result Value Ref Range Status   MRSA by PCR NEGATIVE NEGATIVE Final    Comment:        The GeneXpert MRSA Assay (FDA approved for NASAL specimens only), is one component of a comprehensive MRSA colonization surveillance program. It is not intended to diagnose MRSA infection nor to guide or monitor treatment for MRSA infections. Performed at [facility], [place] [address], [place], [place] [address]   Culture, blood (routine x 2)      Status: None   Collection Time: 05/21/20  3:36 AM   Specimen: BLOOD  Result Value Ref Range Status   Specimen Description BLOOD LEFT ANTECUBITAL  Final   Special Requests   Final    BOTTLES DRAWN AEROBIC ONLY Blood Culture adequate volume   Culture   Final    NO GROWTH 5 DAYS Performed at [facility], [place] [address], [place], [place] [address]    Report Status 05/26/2020 FINAL  Final  Culture, blood (routine x 2)     Status: None   Collection Time: 05/21/20  3:36 AM   Specimen: BLOOD LEFT FOREARM  Result Value Ref Range Status   Specimen Description BLOOD LEFT FOREARM  Final   Special Requests   Final    BOTTLES DRAWN AEROBIC AND ANAEROBIC Blood Culture adequate volume   Culture   Final    NO GROWTH 5 DAYS Performed at [facility], [place] [address] [place],  [address]    Report Status 05/26/2020 FINAL  Final  Culture, respiratory (non-expectorated)     Status: None   Collection Time: 05/21/20  4:25 AM   Specimen: Tracheal Aspirate; Respiratory  Result Value Ref Range Status   Specimen Description TRACHEAL ASPIRATE  Final   Special Requests NONE  Final   Gram Stain   Final    FEW WBC PRESENT, PREDOMINANTLY PMN RARE BUDDING YEAST SEEN    Culture   Final  FEW Consistent with normal respiratory flora. Performed at [facility], [place] [address], [place], [place] [address]    Report Status 05/23/2020 FINAL  Final  Culture, blood (routine x 2)     Status: None   Collection Time: 05/27/20  3:00 PM   Specimen: BLOOD LEFT HAND  Result Value Ref Range Status   Specimen Description BLOOD LEFT HAND  Final   Special Requests   Final    BOTTLES DRAWN AEROBIC ONLY Blood Culture adequate volume   Culture   Final    NO GROWTH 5 DAYS Performed at [facility], [place] [address], [place],  [address]    Report Status 06/01/2020 FINAL  Final  Culture, blood (routine x 2)     Status: None   Collection Time: 05/27/20  3:15 PM   Specimen: BLOOD RIGHT  HAND  Result Value Ref Range Status   Specimen Description BLOOD RIGHT HAND  Final   Special Requests IN PEDIATRIC BOTTLE Blood Culture adequate volume  Final   Culture   Final    NO GROWTH 5 DAYS Performed at [facility], [place] [address] [place],  [address]    Report Status 06/01/2020 FINAL  Final    Studies/Results: VAS [place] UPPER EXT VEIN MAPPING (PRE-OP AVF)  Result Date: 06/05/2020 UPPER EXTREMITY VEIN MAPPING  Indications: Pre-access. Limitations: body habitus Comparison Study: No prior study Performing Technologist: Sharion Dove RVS  Examination Guidelines: A complete evaluation includes B-mode imaging, spectral Doppler, color Doppler, and power Doppler as needed of all accessible portions of each vessel. Bilateral testing is considered an integral part of a complete examination. Limited examinations for reoccurring indications may be performed as noted. +-----------------+-------------+----------+---------+ Right Cephalic   Diameter (cm)Depth (cm)Findings  +-----------------+-------------+----------+---------+ Prox upper arm       0.31        0.28   thrombus  +-----------------+-------------+----------+---------+ Mid upper arm        0.46        0.30   thrombus  +-----------------+-------------+----------+---------+ Dist upper arm       0.49        0.26   thrombus  +-----------------+-------------+----------+---------+ Antecubital fossa    0.69        0.25   thrombus  +-----------------+-------------+----------+---------+ Prox forearm         0.23        0.20   thrombus  +-----------------+-------------+----------+---------+ Mid forearm          0.15        0.30   branching +-----------------+-------------+----------+---------+ Wrist                0.30        0.22   thrombus  +-----------------+-------------+----------+---------+ +-----------------+-------------+----------+--------+ Right Basilic    Diameter (cm)Depth (cm)Findings  +-----------------+-------------+----------+--------+ Dist upper arm       0.20        0.58            +-----------------+-------------+----------+--------+ Antecubital fossa    0.21        0.34            +-----------------+-------------+----------+--------+ Distal forearm       0.14        0.60            +-----------------+-------------+----------+--------+ +-----------------+-------------+----------+---------+ Left Cephalic    Diameter (cm)Depth (cm)Findings  +-----------------+-------------+----------+---------+ Prox upper arm       0.28        0.33   branching +-----------------+-------------+----------+---------+  Mid upper arm        0.14        0.43             +-----------------+-------------+----------+---------+ Dist upper arm       0.24        0.32             +-----------------+-------------+----------+---------+ Antecubital fossa    0.36        0.19   thrombus  +-----------------+-------------+----------+---------+ Prox forearm         0.24        0.23             +-----------------+-------------+----------+---------+ Mid forearm          0.25        0.33             +-----------------+-------------+----------+---------+ Wrist                0.14        0.18             +-----------------+-------------+----------+---------+ +-----------------+-------------+----------+---------+ Left Basilic     Diameter (cm)Depth (cm)Findings  +-----------------+-------------+----------+---------+ Mid upper arm        0.40        1.55    origin   +-----------------+-------------+----------+---------+ Dist upper arm       0.39        1.89             +-----------------+-------------+----------+---------+ Antecubital fossa    0.38        1.26             +-----------------+-------------+----------+---------+ Prox forearm         0.21        0.28             +-----------------+-------------+----------+---------+ Mid forearm          0.15         0.18   branching +-----------------+-------------+----------+---------+ *See table(s) above for measurements and observations.  Diagnosing physician: Monica Martinez MD Electronically signed by Monica Martinez MD on 06/05/2020 at 3:51:19 PM.    Final     Assessment/Plan: POD#21 Right radical nephrectomy  Continue HD per nephrology Continue PT/OT, patient is awaiting IP rehab placement    LOS: 21 days   Nicolette Bang 06/06/2020, 7:38 AM

## 2020-06-06 NOTE — Op Note (Signed)
OPERATIVE REPORT  DATE OF SURGERY: 06/06/2020  PATIENT: Todd Robertson, 74 y.o. male MRN: [id number]  DOB: 1946/06/15  PRE-OPERATIVE DIAGNOSIS: End-stage renal disease  POST-OPERATIVE DIAGNOSIS:  Same  PROCEDURE: #1 right tunneled IJ hemodialysis catheter, #2 left forearm loop AV Gore-Tex graft  SURGEON:  Curt Jews, M.D.  PHYSICIAN ASSISTANT: Nurse  The assistant was needed for exposure and to expedite the case  ANESTHESIA: Local with sedation  EBL: per anesthesia record  Total I/O In: 360 [I.V.:360] Out: 25 [Blood:25]  BLOOD ADMINISTERED: none  DRAINS: none  SPECIMEN: none  COUNTS CORRECT:  YES  PATIENT DISPOSITION:  PACU - hemodynamically stable  PROCEDURE DETAILS: Patient was taken operating placed supine position where the area of the right and left neck and chest prepped draped in usual sterile fashion.  SonoSite ultrasound was used to visualize the left neck and the left internal jugular vein was patent.  The patient has an indwelling right temporary hemodialysis catheter.  The catheter was prepped into the field.  The guidewire was passed through the existing catheter and the existing catheter was removed.  Fluoroscopy was used to confirm placement of the guidewire in the left atrium.  A dilator and peel-away sheath was passed over the guidewire and a tunnel was created from the level of the clavicle to the dilator entry site.  The 23 cm tunnel catheter was brought through the tunnel.  The dilator and guidewire were removed and the catheter was passed down the peel-away sheath which was removed.  The catheter was positioned so the tips were in the distal right atrium.  Both lumens flushed and aspirated easily and were locked with 1000 unit/cc heparin.  The catheter was secured to the skin with a 3-0 nylon stitch and the entry site was closed with a 4-0 subcuticular Vicryl stitch.  Sterile dressing was applied.   Attention was then turned to the left arm.  Left  arm was visualized with SonoSite ultrasound.  The cephalic vein was extremely small.  The basilic vein was also small and not usable for fistula attempt.  The patient had a high branch point of the brachial artery near the axilla and had to moderate sized arteries at the antecubital space.  An incision was made over the antecubital space and carried down to isolate both of these arteries.  The larger of the 2 was deeper.  The patient had a good size brachial vein adjacent to the smaller more superficial arterial branch.  This vein was occluded proximally distally and opened with an 11 blade sent longstanding with Potts scissors.  The vein dilated nicely and a 4 dilator passed with no resistance in this vein.  The decision was made to place a forearm loop graft.  A separate incision was made using local anesthesia over the distal forearm and a loop configuration tunnel was made.  A 4 x 7 tapered Gore-Tex graft was brought through the tunnel.  The larger of the 2 arterial branches was occluded proximally distally and was opened with an 11 blade and sent longstanding with Potts scissors.  The 4 mm portion of the graft was spatulated and sewn end-to-side to the artery with a running 6-0 Prolene suture.  The graft was flushed with heparinized saline and reoccluded.  Next the 7 mm portion of the graft was cut to the appropriate length and was sewn end-to-side to the brachial vein with a running 6-0 Prolene suture.  Clamps removed and excellent thrill was noted.  The  wounds were irrigated with saline.  Hemostasis was obtained with electrocautery.  The wounds were closed with 3-0 Vicryl in the subcutaneous and subcuticular tissue.  Sterile dressing was applied.  The patient maintained a palpable left radial pulse.  He was transferred to the recovery room in stable condition where chest x-ray is pending   [name], M.D., [facility] 06/06/2020 5:21 PM

## 2020-06-06 NOTE — Anesthesia Procedure Notes (Signed)
Procedure Name: MAC Date/Time: 06/06/2020 3:15 PM Performed by: Jenne Campus, CRNA Pre-anesthesia Checklist: Patient identified, Emergency Drugs available, Suction available and Patient being monitored Oxygen Delivery Method: Nasal cannula

## 2020-06-06 NOTE — Progress Notes (Signed)
Physical Therapy Treatment Patient Details Name: Todd Robertson MRN: [id number] DOB: 01-28-1946 Today's Date: 06/06/2020    History of Present Illness 74 yo male presenting 7/9 for planned right Nephrectomy. Went to the OR that day.  While in the PACU had declining w/ systolic BPs in 23J. Placed in Trendelenburg; IVF boluses administered. BP normalized but only made 33ml of urine. Large volume of hemorrhage in R nephrectomy, R retoperitoneum anerior ro the ilipsoas muslce and latral abdominal cavity.  Pt is dialysis dependent and presumed septic shock with unknown etiology noted in chart. PMH  h/o prostate CA, DM2, gout, HTN    PT Comments    Pt making excellent progress. Motivated to return to independence.    Follow Up Recommendations  CIR     Equipment Recommendations  Other (comment) (TBD)    Recommendations for Other Services       Precautions / Restrictions Precautions Precautions: Fall    Mobility  Bed Mobility Overal bed mobility: Needs Assistance Bed Mobility: Supine to Sit     Supine to sit: Supervision;HOB elevated     General bed mobility comments: HOB elevate and incr time  Transfers Overall transfer level: Needs assistance Equipment used: Rolling walker (2 wheeled) Transfers: Sit to/from Stand Sit to Stand: Min guard;From elevated surface         General transfer comment: Assist for safety.   Ambulation/Gait Ambulation/Gait assistance: Min guard Gait Distance (Feet): 90 Feet (x 2) Assistive device: Rolling walker (2 wheeled) Gait Pattern/deviations: Step-through pattern;Decreased stride length Gait velocity: decr Gait velocity interpretation: 1.31 - 2.62 ft/sec, indicative of limited community ambulator General Gait Details: Assist for safety   Stairs             Wheelchair Mobility    Modified Rankin (Stroke Patients Only)       Balance Overall balance assessment: Needs assistance Sitting-balance support: Feet  supported Sitting balance-Leahy Scale: Fair     Standing balance support: Bilateral upper extremity supported Standing balance-Leahy Scale: Poor Standing balance comment: walker and supervision for static standing                            Cognition Arousal/Alertness: Awake/alert Behavior During Therapy: WFL for tasks assessed/performed Overall Cognitive Status: Within Functional Limits for tasks assessed                                        Exercises      General Comments General comments (skin integrity, edema, etc.): VSS      Pertinent Vitals/Pain Pain Assessment: No/denies pain    Home Living Family/patient expects to be discharged to:: Private residence Living Arrangements: Spouse/significant other Available Help at Discharge: Family;Available 24 hours/day Type of Home: House Home Access: Stairs to enter   Home Layout: One level Home Equipment: Environmental consultant - 2 wheels;Bedside commode      Prior Function Level of Independence: Independent      Comments: With ADL's and iADL's   PT Goals (current goals can now be found in the care plan section) Acute Rehab PT Goals Patient Stated Goal: independence Progress towards PT goals: Progressing toward goals    Frequency    Min 3X/week      PT Plan Current plan remains appropriate    Co-evaluation              AM-PAC PT "  6 Clicks" Mobility   Outcome Measure  Help needed turning from your back to your side while in a flat bed without using bedrails?: A Little Help needed moving from lying on your back to sitting on the side of a flat bed without using bedrails?: A Little Help needed moving to and from a bed to a chair (including a wheelchair)?: A Little Help needed standing up from a chair using your arms (e.g., wheelchair or bedside chair)?: A Little Help needed to walk in hospital room?: A Little Help needed climbing 3-5 steps with a railing? : A Lot 6 Click Score: 17     End of Session Equipment Utilized During Treatment: Gait belt Activity Tolerance: Patient tolerated treatment well Patient left: in bed;with call bell/phone within reach;with bed alarm set (sitting EOB) Nurse Communication: Mobility status PT Visit Diagnosis: Other abnormalities of gait and mobility (R26.89);Unsteadiness on feet (R26.81)     Time: 0511-0211 PT Time Calculation (min) (ACUTE ONLY): 15 min  Charges:  $Gait Training: 8-22 mins                     [place] Pager [phone] Office [place] 06/06/2020, 11:46 AM

## 2020-06-06 NOTE — Progress Notes (Signed)
[facility] Progress Note  Interval events/Subjective:   For [facility] and AVF/G today  No c/o this AM  K 4.8, BUN 100  No UOP  CLIP in process, family req [facility]  [place] to CIR at [place]:   06/06/20 0002 06/06/20 0507 06/06/20 0818 06/06/20 0836  BP: (!) 129/80 (!) 122/64 (!) 144/70   Pulse: 95 87 104   Resp: 21 19 20    Temp: 97.6 F (36.4 C) 98.2 F (36.8 C) 98.1 F (36.7 C)   TempSrc: Oral Oral Oral   SpO2: 98% 97% 97% 98%  Weight:  (!) 112.5 kg    Height:        Exam: Gen: nad Heent: +ngt CV: rrr, s1s2, no m/r/g Resp: cta bl, no overt w/r/r/c, normal wob Abd: soft, distension significantly improved Ext: no edema Neuro: awake, following commands, speech clear and coherent Rt temp hd cath (trialysis)    Home meds:  - asa 20m/ lipitor 40 hs/ benazepril 20 am/ bumex 164mqd/ coreg 25 bid  - allopurinol 300 hs  - symbicort ACT 2 puff bid  - prn's/ vitamins/ supplements  Date                             Creat               egFR   2018                          1.79- 2.38        30- 43   2019                          1.80- 1.81        42-43   January 12, 2020           3.00                    May 13, 2020                2.60                 27   Jul 9                           2.51                 29- 33   Jul 10                         3.42, 3.89    UA none yet   CT abd wo contrast 7/9 - Adrenals/Urinary Tract: Post right nephrectomy. Large volume of hemorrhage in the right nephrectomy bed. Ill-defined air fluid collection tracks in the right retroperitoneum anterior to the iliopsoas muscle and laterally in the abdominal cavity. Hemorrhage tracks into the right aspect of the pelvis in the is only partially included in the field of view. There is moderate subcutaneous emphysema involving the right abdominal wall. Small foci of air dissects between the right abdominal wall musculature. Simple cyst in the left kidney. There is no left  hydronephrosis.  CT abd w/ contrast 05/28/2020 1. Slight decrease in size of right retroperitoneal hematoma following right nephrectomy. 2. No enhancing abscess is present. 3. Improved subcutaneous gas over  the right side of the abdomen. 4. Improved bibasilar airspace disease and effusions, right greater than left. 5. Stable moderate dilation of the transverse colon. 6. Stable dilation of the stomach despite OG tube in place 7. Multilevel degenerative changes in the lumbar spine. 8. Aortic Atherosclerosis (ICD10-I70.0).        Recent Labs  Lab 06/03/20 0157 06/03/20 0157 06/04/20 0434 06/04/20 1647 06/05/20 0529 06/06/20 0418  K 4.6   < > 4.4  --  4.0 4.8  BUN 49*   < > 89*  --  66* 100*  CREATININE 6.46*   < > 9.32*  --  7.25* 8.92*  CALCIUM 8.3*   < > 8.2*  --  8.2* 8.4*  PHOS 3.4  --  4.1  --   --   --   HGB 7.0*   < > 6.8*   < > 7.2* 7.7*   < > = values in this interval not displayed.   Inpatient medications: . arformoterol  15 mcg Nebulization BID  . budesonide (PULMICORT) nebulizer solution  0.5 mg Nebulization BID  . chlorhexidine gluconate (MEDLINE KIT)  15 mL Mouth Rinse BID  . Chlorhexidine Gluconate Cloth  6 each Topical Daily  . [START ON 06/07/2020] darbepoetin (ARANESP) injection - NON-DIALYSIS  200 mcg Subcutaneous Q Sat-1800  . feeding supplement (PROSource TF)  90 mL Per Tube BID  . hydrocortisone sod succinate (SOLU-CORTEF) inj  50 mg Intravenous BID  . insulin aspart  0-9 Units Subcutaneous Q4H  . insulin aspart  4 Units Subcutaneous Q4H  . insulin detemir  18 Units Subcutaneous QHS  . mouth rinse  15 mL Mouth Rinse q12n4p  . midodrine  10 mg Oral TID WC  . sodium chloride flush  10-40 mL Intracatheter Q12H   . sodium chloride Stopped (05/27/20 0829)  . sodium chloride    . sodium chloride    . sodium chloride    . feeding supplement (VITAL 1.5 CAL) 1,190 mL (06/05/20 2135)   sodium chloride, sodium chloride, sodium chloride, acetaminophen  **OR** acetaminophen, albuterol, diphenhydrAMINE **OR** diphenhydrAMINE, heparin, heparin, lidocaine, loperamide HCl, menthol-cetylpyridinium, ondansetron (ZOFRAN) IV, phenol, promethazine, Resource ThickenUp Clear, sodium chloride flush, tiZANidine   Assessment/ Plan: 1. Dialysis dependent AoCKD IV - b/l creat 2.60, eGFR 27 then s/p nephrectomy with resultant progressive renal insufficiency and oliguria.  Followed by [employer].  He is now ESRD.  Using R IJ Temp HD cath.  Off CRRT 7/18, restarted 7/20-7/26.  1. iHD THS: 2K, 3.5h, 2-3L UF, TDC 400/800, No heparin 2. VVS to place TDC/AVF 7/30 3. CLIP in process as ESRD 2. Renal mass - sp R nephrectomy 05/16/20; neg margins on path 3. Anemia ckd / abl - on Fe and ESA, neg margins on path; max ESA 4. Shock, resolved 5. Hyponatremia - resolved 6. Hyperkalemia - resolved, 7. Leukocytosis: persistent, afebrile 8. CKD-BMD: P  And Ca ok  Rexene Agent , MD [facility]

## 2020-06-06 NOTE — Interval H&P Note (Signed)
History and Physical Interval Note:  06/06/2020 2:03 PM  DRAYCE TAWIL  has presented today for surgery, with the diagnosis of END STAGE RENAL DISEASE.  The various methods of treatment have been discussed with the patient and family. After consideration of risks, benefits and other options for treatment, the patient has consented to  Procedure(s): INSERTION OF DIALYSIS CATHETER (N/A) LEFT ARM ARTERIOVENOUS (AV) FISTULA VERSUS GRAFT (Left) as a surgical intervention.  The patient's history has been reviewed, patient examined, no change in status, stable for surgery.  I have reviewed the patient's chart and labs.  Questions were answered to the patient's satisfaction.     Curt Jews

## 2020-06-07 LAB — BASIC METABOLIC PANEL
Anion gap: 19 — ABNORMAL HIGH (ref 5–15)
BUN: 122 mg/dL — ABNORMAL HIGH (ref 8–23)
CO2: 19 mmol/L — ABNORMAL LOW (ref 22–32)
Calcium: 8.5 mg/dL — ABNORMAL LOW (ref 8.9–10.3)
Chloride: 94 mmol/L — ABNORMAL LOW (ref 98–111)
Creatinine, Ser: 10.91 mg/dL — ABNORMAL HIGH (ref 0.61–1.24)
GFR calc Af Amer: 5 mL/min — ABNORMAL LOW (ref >60)
GFR calc non Af Amer: 4 mL/min — ABNORMAL LOW (ref >60)
Glucose, Bld: 335 mg/dL — ABNORMAL HIGH (ref 70–99)
Potassium: 4.5 mmol/L (ref 3.5–5.1)
Sodium: 132 mmol/L — ABNORMAL LOW (ref 135–145)

## 2020-06-07 LAB — CBC
HCT: 25.8 % — ABNORMAL LOW (ref 39.0–52.0)
Hemoglobin: 8.4 g/dL — ABNORMAL LOW (ref 13.0–17.0)
MCH: 30.7 pg (ref 26.0–34.0)
MCHC: 32.6 g/dL (ref 30.0–36.0)
MCV: 94.2 fL (ref 80.0–100.0)
Platelets: 325 10*3/uL (ref 150–400)
RBC: 2.74 MIL/uL — ABNORMAL LOW (ref 4.22–5.81)
RDW: 18.8 % — ABNORMAL HIGH (ref 11.5–15.5)
WBC: 12.3 10*3/uL — ABNORMAL HIGH (ref 4.0–10.5)
nRBC: 0 % (ref 0.0–0.2)

## 2020-06-07 LAB — GLUCOSE, CAPILLARY
Glucose-Capillary: 150 mg/dL — ABNORMAL HIGH (ref 70–99)
Glucose-Capillary: 167 mg/dL — ABNORMAL HIGH (ref 70–99)
Glucose-Capillary: 301 mg/dL — ABNORMAL HIGH (ref 70–99)
Glucose-Capillary: 324 mg/dL — ABNORMAL HIGH (ref 70–99)

## 2020-06-07 LAB — HEPATITIS B CORE ANTIBODY, TOTAL: Hep B Core Total Ab: NONREACTIVE

## 2020-06-07 MED ORDER — MIDODRINE HCL 5 MG PO TABS
10.0000 mg | ORAL_TABLET | Freq: Three times a day (TID) | ORAL | Status: DC
  Administered 2020-06-07 – 2020-06-08 (×4): 10 mg via ORAL
  Filled 2020-06-07 (×3): qty 2

## 2020-06-07 MED ORDER — METOPROLOL TARTRATE 5 MG/5ML IV SOLN: 2.5000 mg | Freq: Four times a day (QID) | INTRAVENOUS | Status: DC | PRN

## 2020-06-07 MED ORDER — HYDROCORTISONE 20 MG PO TABS
20.0000 mg | ORAL_TABLET | Freq: Two times a day (BID) | ORAL | Status: DC
  Administered 2020-06-08 (×2): 20 mg via ORAL
  Filled 2020-06-07 (×6): qty 1

## 2020-06-07 MED ORDER — LEVALBUTEROL HCL 0.63 MG/3ML IN NEBU: 0.6300 mg | INHALATION_SOLUTION | RESPIRATORY_TRACT | Status: DC | PRN

## 2020-06-07 MED ORDER — DARBEPOETIN ALFA 200 MCG/0.4ML IJ SOSY
PREFILLED_SYRINGE | INTRAMUSCULAR | Status: AC
  Administered 2020-06-07: 200 ug via SUBCUTANEOUS
  Filled 2020-06-07: qty 0.4

## 2020-06-07 MED ORDER — HEPARIN SODIUM (PORCINE) 1000 UNIT/ML IJ SOLN
INTRAMUSCULAR | Status: AC
  Administered 2020-06-07: 1000 [IU]
  Filled 2020-06-07: qty 1

## 2020-06-07 MED ORDER — MIDODRINE HCL 5 MG PO TABS
5.0000 mg | ORAL_TABLET | Freq: Three times a day (TID) | ORAL | Status: DC
  Administered 2020-06-07: 5 mg via ORAL

## 2020-06-07 NOTE — Anesthesia Postprocedure Evaluation (Signed)
Anesthesia Post Note  Patient: ANTONIS LOR  Procedure(s) Performed: INSERTION OF DIALYSIS CATHETER USING 23CM DOUBLE LUMEN CATHETER (Right ) LEFT ARM ARTERIOVENOUS (AV) GRAFT USING 45CM GORTEX (Left )     Patient location during evaluation: PACU Anesthesia Type: MAC Level of consciousness: awake and alert Pain management: pain level controlled Vital Signs Assessment: post-procedure vital signs reviewed and stable Respiratory status: spontaneous breathing, nonlabored ventilation, respiratory function stable and patient connected to nasal cannula oxygen Cardiovascular status: stable and blood pressure returned to baseline Postop Assessment: no apparent nausea or vomiting Anesthetic complications: no   No complications documented.  Last Vitals:  Vitals:   06/07/20 1257 06/07/20 1259  BP: (!) 93/53 (!) 93/53  Pulse: 64 57  Resp: 18 23  Temp: 37.4 C   SpO2: 99% 99%    Last Pain:  Vitals:   06/07/20 1257  TempSrc: Oral  PainSc:                  Tiajuana Amass

## 2020-06-07 NOTE — Progress Notes (Signed)
PT Cancellation Note  Patient Details Name: Todd Robertson MRN: [id number] DOB: 02-02-1946   Cancelled Treatment:    Reason Eval/Treat Not Completed: (P) Patient at procedure or test/unavailable (Pt off unit at HD will f/u per POC.)   [name] [name] 06/07/2020, 10:12 AM  Erasmo Leventhal , PTA Acute Rehabilitation Services Pager [phone] Office [phone]

## 2020-06-07 NOTE — Procedures (Signed)
I was present at this dialysis session. I have reviewed the session itself and made appropriate changes.   LFA AVG and New TDC yesterday. TDC Qb 400 this AM.  2K time 4h, UF goal 2L.  Hb stable.    Filed Weights   06/06/20 0507 06/06/20 1326 06/07/20 0413  Weight: (!) 112.5 kg (!) 112.5 kg (!) 113.5 kg    Recent Labs  Lab 06/04/20 0434 06/05/20 0529 06/07/20 0200  NA 134*   < > 132*  K 4.4   < > 4.5  CL 97*   < > 94*  CO2 21*   < > 19*  GLUCOSE 349*   < > 335*  BUN 89*   < > 122*  CREATININE 9.32*   < > 10.91*  CALCIUM 8.2*   < > 8.5*  PHOS 4.1  --   --    < > = values in this interval not displayed.    Recent Labs  Lab 06/05/20 0529 06/06/20 0418 06/07/20 0200  WBC 15.2* 13.4* 12.3*  HGB 7.2* 7.7* 8.4*  HCT 22.9* 24.0* 25.8*  MCV 92.7 94.1 94.2  PLT 242 300 325    Scheduled Meds: . arformoterol  15 mcg Nebulization BID  . budesonide (PULMICORT) nebulizer solution  0.5 mg Nebulization BID  . chlorhexidine gluconate (MEDLINE KIT)  15 mL Mouth Rinse BID  . Chlorhexidine Gluconate Cloth  6 each Topical Daily  . darbepoetin (ARANESP) injection - NON-DIALYSIS  200 mcg Subcutaneous Q Sat-1800  . feeding supplement (PROSource TF)  90 mL Per Tube BID  . hydrocortisone sod succinate (SOLU-CORTEF) inj  50 mg Intravenous BID  . insulin aspart  0-9 Units Subcutaneous Q4H  . insulin aspart  4 Units Subcutaneous Q4H  . insulin detemir  18 Units Subcutaneous QHS  . mouth rinse  15 mL Mouth Rinse q12n4p  . midodrine  10 mg Oral TID WC  . sodium chloride flush  10-40 mL Intracatheter Q12H   Continuous Infusions: . sodium chloride Stopped (05/27/20 0829)  . sodium chloride    . sodium chloride    . sodium chloride    . feeding supplement (VITAL 1.5 CAL) Stopped (06/07/20 0654)   PRN Meds:.sodium chloride, sodium chloride, sodium chloride, acetaminophen **OR** acetaminophen, albuterol, diphenhydrAMINE **OR** diphenhydrAMINE, heparin, heparin, lidocaine, loperamide HCl,  menthol-cetylpyridinium, ondansetron (ZOFRAN) IV, phenol, promethazine, Resource ThickenUp Clear, sodium chloride flush, tiZANidine   Pearson Grippe  MD 06/07/2020, 8:27 AM

## 2020-06-07 NOTE — Progress Notes (Addendum)
Pt noted with sustained HR 150's with PVC's.  Pt noted sitting up on the side of the bed.   MD text paged.  EKG completed

## 2020-06-07 NOTE — Progress Notes (Signed)
   VASCULAR SURGERY ASSESSMENT & PLAN:   POD 1 LEFT AVG: His graft has an excellent thrill.  He has a palpable radial pulse.  His incisions are healing fine.  Vascular surgery will be available as needed.   SUBJECTIVE:   No specific complaints.  PHYSICAL EXAM:   Vitals:   06/06/20 2058 06/06/20 2134 06/06/20 2338 06/07/20 0413  BP:   (!) 142/68 (!) 138/61  Pulse:  92 95 100  Resp:  _0 Temp:   98.2 F (36.8 C) 98.5 F (36.9 C)  TempSrc:   Oral Oral  SpO2: 99% 92% 98% 99%  Weight:    (!) 113.5 kg  Height:       Good thrill in left forearm AV graft Palpable left radial pulse Incisions look fine  LABS:   Lab Results  Component Value Date   WBC 12.3 (H) 06/07/2020   HGB 8.4 (L) 06/07/2020   HCT 25.8 (L) 06/07/2020   MCV 94.2 06/07/2020   PLT 325 06/07/2020   Lab Results  Component Value Date   CREATININE 10.91 (H) 06/07/2020   CBG (last 3)  Recent Labs    06/06/20 2007 06/07/20 0006 06/07/20 0423  GLUCAP 246* 301* 324*    PROBLEM LIST:    Active Problems:   Cancer of kidney (HCC)   S/p nephrectomy   Malnutrition of moderate degree   CURRENT MEDS:   . arformoterol  15 mcg Nebulization BID  . budesonide (PULMICORT) nebulizer solution  0.5 mg Nebulization BID  . chlorhexidine gluconate (MEDLINE KIT)  15 mL Mouth Rinse BID  . Chlorhexidine Gluconate Cloth  6 each Topical Daily  . darbepoetin (ARANESP) injection - NON-DIALYSIS  200 mcg Subcutaneous Q Sat-1800  . feeding supplement (PROSource TF)  90 mL Per Tube BID  . hydrocortisone  20 mg Oral BID  . insulin aspart  0-9 Units Subcutaneous Q4H  . insulin aspart  4 Units Subcutaneous Q4H  . insulin detemir  18 Units Subcutaneous QHS  . mouth rinse  15 mL Mouth Rinse q12n4p  . midodrine  5 mg Oral TID WC  . sodium chloride flush  10-40 mL Intracatheter [place] Office: [phone] 06/07/2020

## 2020-06-07 NOTE — Progress Notes (Signed)
PT Cancellation Note  Patient Details Name: Todd Robertson MRN: [id number] DOB: [birth year]   Cancelled Treatment:    Reason Eval/Treat Not Completed: (P) Medical issues which prohibited therapy (RN deferral pt in RVR and RN awaiting orders from MD.  Will defer PT at this time.)   Delenn Ahn [name] 06/07/2020, 4:49 PM  Erasmo Leventhal , PTA Acute Rehabilitation Services Pager [phone] Office [phone]

## 2020-06-07 NOTE — Progress Notes (Signed)
PROGRESS NOTE  Todd Robertson:[id number] DOB: Jul 04, 1946 DOA: 05/16/2020 PCP: Monico Blitz, MD   LOS: 22 days   Brief narrative: As per HPI,  Todd Robertson is a 74 years old male with a history of right renal mass, prostate cancer, osteoarthritis, cardiomyopathy, CKD, diabetes mellitus type 2, gout, hypertension, asthma presented to hospital for elective nephrectomy. 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  He underwent right nephrectomy on 05/16/2020 with retroperitoneal hematoma.  Creatinine on presentation was 2.6.  Postoperatively, patient developed hypotension and patient was transferred to the ICU.  Patient received vasopressors for hypotension.  Patient was subsequently considered medically stable for transfer out of ICU.  Hospitalist team was consulted for medical management.  Assessment/Plan:  Active Problems:   Cancer of kidney ([place])   S/p nephrectomy   Malnutrition of moderate degree  Hypotension Improved. on decreased dose of midodrine to 10mg  PO TID. with change to hydrocortisone 20 twice daily PO. We will continue to decrease midodrine 5 mg 3 times daily.  Hx of asthma. 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  Patient underwent hemodialysis catheter placement and fistula placement for hemodialysis on 06/06/2020.  S/p Rt nephrectomy for renal cell carcinoma. Management as per primary team.  Anemia postoperative from blood loss plus anemia of chronic disease, retroperitoneal hematoma. Ttransfuse for Hb < 7 or significant bleeding. Status post PRBC I unit on 7/28.  Hemoglobin of 8.4< 7.7< 7.2< 6.8.    DM type 2 with hyperglycemia  Continue sliding scale insulin, Accu-Cheks diabetic diet.  On Levemir sliding scale insulin.Todd Robertson POC glucose of 324.  Will decrease insulin once steroid dose has been decreased.  Chronic diastolic CHF, HLD. Continue to monitor.compensated.  Deconditioning - PT/OT has recommended CIR  placement.  Dysphagia. - speech therapy recommending regular diet with thin liquids from assessment on 7/26. Poor oral intake so patient is still on tube feeding as per dietician.  Will consider de-escalation as possible.  Disposition plan.  CIR.    DVT prophylaxis: SCDs Start: 05/16/20 1601   Code Status: Full code  Family Communication: None   Consultants: Nephrology Urology PCCM Vascular surgery  Procedures: 7/09 Rt nephrectomy 7/13 Rt IJ HD catheter by IR; worsening hypercapnia and AMS >> intubated,  7/14 start CRRT Rt IJ HD catheter 7/13 >> ETT 7/13 >> 7/17 Rt radial a line 7/15 > 7/18 Hemodialysis. Internal jugular hemodialysis catheter placement, left AV graft placement.  On 06/06/2020  Antibiotics:  Cefepime 7/13 >> 7/20 Vancomycin 7/13 >> 7/17   Subjective: Today, patient was seen and examined at bedside.  Patient denies any nausea vomiting abdominal pain had some bowel movements   objective: Vitals:   06/06/20 2338 06/07/20 0413  BP: (!) 142/68 (!) 138/61  Pulse: 95 100  Resp: 21 18  Temp: 98.2 F (36.8 C) 98.5 F (36.9 C)  SpO2: 98% 99%    Intake/Output Summary (Last 24 hours) at 06/07/2020 0825 Last data filed at 06/06/2020 1642 Gross per 24 hour  Intake 360 ml  Output 25 ml  Net 335 ml   Filed Weights   06/06/20 0507 06/06/20 1326 06/07/20 0413  Weight: (!) 112.5 kg (!) 112.5 kg (!) 113.5 kg   Body mass index is 32.13 kg/m.   Physical Exam: GENERAL: Alert awake oriented and communicative.  Not in obvious distress.  Cortrak tube tube in place.  Obese HENT: No scleral pallor or icterus. Pupils equally reactive to light. Oral mucosa is moist NECK: is supple, no  gross swelling noted.  Right internal jugular hemodialysis catheter in place. CHEST:   Diminished breath sounds bilaterally.  No obvious wheezes or crackles noted. CVS: S1 and S2 heard, no murmur. Regular rate and rhythm.  ABDOMEN: Soft, non-tender, bowel sounds are present.   Obese abdomen. EXTREMITIES: Trace bilateral lower extremity edema CNS: Cranial nerves are intact. No focal motor deficits. SKIN: warm and dry without rashes.  Data Review: I have personally reviewed the following laboratory data and studies,  CBC: Recent Labs  Lab 06/03/20 0157 06/03/20 0157 06/04/20 0434 06/04/20 1647 06/05/20 0529 06/06/20 0418 06/07/20 0200  WBC 14.3*  --  17.4*  --  15.2* 13.4* 12.3*  HGB 7.0*   < > 6.8* 7.7* 7.2* 7.7* 8.4*  HCT 22.6*   < > 21.5* 24.5* 22.9* 24.0* 25.8*  MCV 94.2  --  96.0  --  92.7 94.1 94.2  PLT 243  --  256  --  242 300 325   < > = values in this interval not displayed.   Basic Metabolic Panel: Recent Labs  Lab 06/01/20 0232 06/01/20 0232 06/01/20 1545 06/01/20 1545 06/02/20 0153 06/02/20 0153 06/02/20 2039 06/02/20 2039 06/03/20 0157 06/04/20 0434 06/05/20 0529 06/06/20 0418 06/07/20 0200  NA 140   < > 137   < > 139   < > 136   < > 135 134* 133* 133* 132*  K 4.7   < > 4.5   < > 4.4   < > 5.1   < > 4.6 4.4 4.0 4.8 4.5  CL 103   < > 104   < > 102   < > 103   < > 102 97* 96* 96* 94*  CO2 26   < > 24   < > 26   < > 22   < > 22 21* 24 21* 19*  GLUCOSE 157*   < > 173*   < > 150*   < > 289*   < > 240* 349* 326* 344* 335*  BUN 27*   < > 25*   < > 24*   < > 42*   < > 49* 89* 66* 100* 122*  CREATININE 3.18*   < > 3.40*   < > 3.26*   < > 5.81*   < > 6.46* 9.32* 7.25* 8.92* 10.91*  CALCIUM 8.2*   < > 8.2*   < > 8.7*   < > 8.2*   < > 8.3* 8.2* 8.2* 8.4* 8.5*  MG 2.5*  --   --   --  2.6*  --  2.5*  --  2.6*  --  2.3  --   --   PHOS 2.6   < > 2.3*  --  2.3*  --  3.5  --  3.4 4.1  --   --   --    < > = values in this interval not displayed.   Liver Function Tests: Recent Labs  Lab 06/02/20 0153 06/02/20 2039 06/03/20 0157 06/04/20 0434 06/05/20 0529  AST  --   --   --   --  19  ALT  --   --   --   --  18  ALKPHOS  --   --   --   --  66  BILITOT  --   --   --   --  0.7  PROT  --   --   --   --  5.1*  ALBUMIN 2.7* 2.4* 2.5* 2.4*  2.2*   No results for input(s): LIPASE, AMYLASE in the last 168 hours. No results for input(s): AMMONIA in the last 168 hours. Cardiac Enzymes: No results for input(s): CKTOTAL, CKMB, CKMBINDEX, TROPONINI in the last 168 hours. BNP (last 3 results) No results for input(s): BNP in the last 8760 hours.  ProBNP (last 3 results) No results for input(s): PROBNP in the last 8760 hours.  CBG: Recent Labs  Lab 06/06/20 1257 06/06/20 1730 06/06/20 2007 06/07/20 0006 06/07/20 0423  GLUCAP 189* 174* 246* 301* 324*   No results found for this or any previous visit (from the past 240 hour(s)).   Studies: DG Chest Port 1V same Day  Result Date: 06/06/2020 CLINICAL DATA:  Postop. EXAM: PORTABLE CHEST 1 VIEW COMPARISON:  05/26/2020 FINDINGS: Right internal jugular central venous catheter tip at the atrial caval junction. Weighted enteric tube in place with tip below the diaphragm not included in the field of view. No pneumothorax. Stable heart size and mediastinal contours. Improvement in left basilar airspace disease from prior exam. Unchanged right basilar atelectasis. No significant pleural effusion. No other interval change. IMPRESSION: 1. Right-sided dialysis catheter tip at the atrial caval junction. No pneumothorax. 2. Improvement in left basilar airspace disease from prior exam. Unchanged right basilar atelectasis. Electronically Signed   By: Keith Rake M.D.   On: 06/06/2020 19:25   DG Fluoro Guide CV Line-No Report  Result Date: 06/06/2020 Fluoroscopy was utilized by the requesting physician.  No radiographic interpretation.   VAS [place] UPPER EXT VEIN MAPPING (PRE-OP AVF)  Result Date: 06/05/2020 UPPER EXTREMITY VEIN MAPPING  Indications: Pre-access. Limitations: body habitus Comparison Study: No prior study Performing Technologist: Sharion Dove RVS  Examination Guidelines: A complete evaluation includes B-mode imaging, spectral Doppler, color Doppler, and power Doppler as needed of  all accessible portions of each vessel. Bilateral testing is considered an integral part of a complete examination. Limited examinations for reoccurring indications may be performed as noted. +-----------------+-------------+----------+---------+ Right Cephalic   Diameter (cm)Depth (cm)Findings  +-----------------+-------------+----------+---------+ Prox upper arm       0.31        0.28   thrombus  +-----------------+-------------+----------+---------+ Mid upper arm        0.46        0.30   thrombus  +-----------------+-------------+----------+---------+ Dist upper arm       0.49        0.26   thrombus  +-----------------+-------------+----------+---------+ Antecubital fossa    0.69        0.25   thrombus  +-----------------+-------------+----------+---------+ Prox forearm         0.23        0.20   thrombus  +-----------------+-------------+----------+---------+ Mid forearm          0.15        0.30   branching +-----------------+-------------+----------+---------+ Wrist                0.30        0.22   thrombus  +-----------------+-------------+----------+---------+ +-----------------+-------------+----------+--------+ Right Basilic    Diameter (cm)Depth (cm)Findings +-----------------+-------------+----------+--------+ Dist upper arm       0.20        0.58            +-----------------+-------------+----------+--------+ Antecubital fossa    0.21        0.34            +-----------------+-------------+----------+--------+ Distal forearm       0.14        0.60            +-----------------+-------------+----------+--------+ +-----------------+-------------+----------+---------+  Left Cephalic    Diameter (cm)Depth (cm)Findings  +-----------------+-------------+----------+---------+ Prox upper arm       0.28        0.33   branching +-----------------+-------------+----------+---------+ Mid upper arm        0.14        0.43              +-----------------+-------------+----------+---------+ Dist upper arm       0.24        0.32             +-----------------+-------------+----------+---------+ Antecubital fossa    0.36        0.19   thrombus  +-----------------+-------------+----------+---------+ Prox forearm         0.24        0.23             +-----------------+-------------+----------+---------+ Mid forearm          0.25        0.33             +-----------------+-------------+----------+---------+ Wrist                0.14        0.18             +-----------------+-------------+----------+---------+ +-----------------+-------------+----------+---------+ Left Basilic     Diameter (cm)Depth (cm)Findings  +-----------------+-------------+----------+---------+ Mid upper arm        0.40        1.55    origin   +-----------------+-------------+----------+---------+ Dist upper arm       0.39        1.89             +-----------------+-------------+----------+---------+ Antecubital fossa    0.38        1.26             +-----------------+-------------+----------+---------+ Prox forearm         0.21        0.28             +-----------------+-------------+----------+---------+ Mid forearm          0.15        0.18   branching +-----------------+-------------+----------+---------+ *See table(s) above for measurements and observations.  Diagnosing physician: Monica Martinez MD Electronically signed by Monica Martinez MD on 06/05/2020 at 3:51:19 PM.    Final       Flora Lipps, MD  [facility] 06/07/2020

## 2020-06-08 ENCOUNTER — Encounter: Payer: Self-pay | Admitting: Cardiology

## 2020-06-08 LAB — BASIC METABOLIC PANEL
Anion gap: 14 (ref 5–15)
BUN: 60 mg/dL — ABNORMAL HIGH (ref 8–23)
CO2: 23 mmol/L (ref 22–32)
Calcium: 8.3 mg/dL — ABNORMAL LOW (ref 8.9–10.3)
Chloride: 98 mmol/L (ref 98–111)
Creatinine, Ser: 7.13 mg/dL — ABNORMAL HIGH (ref 0.61–1.24)
GFR calc Af Amer: 8 mL/min — ABNORMAL LOW (ref >60)
GFR calc non Af Amer: 7 mL/min — ABNORMAL LOW (ref >60)
Glucose, Bld: 108 mg/dL — ABNORMAL HIGH (ref 70–99)
Potassium: 4.4 mmol/L (ref 3.5–5.1)
Sodium: 135 mmol/L (ref 135–145)

## 2020-06-08 LAB — CBC
HCT: 24.6 % — ABNORMAL LOW (ref 39.0–52.0)
Hemoglobin: 7.6 g/dL — ABNORMAL LOW (ref 13.0–17.0)
MCH: 28.9 pg (ref 26.0–34.0)
MCHC: 30.9 g/dL (ref 30.0–36.0)
MCV: 93.5 fL (ref 80.0–100.0)
Platelets: 277 10*3/uL (ref 150–400)
RBC: 2.63 MIL/uL — ABNORMAL LOW (ref 4.22–5.81)
RDW: 19.2 % — ABNORMAL HIGH (ref 11.5–15.5)
WBC: 10.4 10*3/uL (ref 4.0–10.5)
nRBC: 0.2 % (ref 0.0–0.2)

## 2020-06-08 LAB — GLUCOSE, CAPILLARY
Glucose-Capillary: 158 mg/dL — ABNORMAL HIGH (ref 70–99)
Glucose-Capillary: 160 mg/dL — ABNORMAL HIGH (ref 70–99)
Glucose-Capillary: 163 mg/dL — ABNORMAL HIGH (ref 70–99)
Glucose-Capillary: 180 mg/dL — ABNORMAL HIGH (ref 70–99)
Glucose-Capillary: 97 mg/dL (ref 70–99)

## 2020-06-08 LAB — MAGNESIUM: Magnesium: 2.1 mg/dL (ref 1.7–2.4)

## 2020-06-08 MED ORDER — INSULIN DETEMIR 100 UNIT/ML ~~LOC~~ SOLN
14.0000 [IU] | Freq: Every day | SUBCUTANEOUS | Status: DC
  Administered 2020-06-08 – 2020-06-09 (×2): 14 [IU] via SUBCUTANEOUS
  Filled 2020-06-08 (×3): qty 0.14

## 2020-06-08 MED ORDER — METOPROLOL TARTRATE 12.5 MG HALF TABLET
12.5000 mg | ORAL_TABLET | Freq: Two times a day (BID) | ORAL | Status: DC
  Administered 2020-06-08 – 2020-06-09 (×3): 12.5 mg via ORAL
  Filled 2020-06-08 (×3): qty 1

## 2020-06-08 MED ORDER — INSULIN ASPART 100 UNIT/ML ~~LOC~~ SOLN: 3.0000 [IU] | SUBCUTANEOUS | Status: DC

## 2020-06-08 NOTE — Progress Notes (Signed)
[facility] Progress Note  Interval events/Subjective:   HD yesterday, went well  Wants NGT out  2.3L UF with HD  No clear inc in UOP  CLIP in process, family req [facility]  [place] to CIR at [place]:   06/08/20 0500 06/08/20 0600 06/08/20 0619 06/08/20 0700  BP:   (!) 110/51   Pulse: 79 101 63 72  Resp: (!) 33 (!) 30 (!) 26 (!) 33  Temp:   99.3 F (37.4 C)   TempSrc:   Oral   SpO2: 95% 97% 97% 97%  Weight:   (!) 115 kg   Height:        Exam: Gen: nad Heent: +ngt CV: rrr, s1s2, no m/r/g Resp: cta bl, no overt w/r/r/c, normal wob Abd: soft, distension significantly improved Ext: no edema Neuro: awake, following commands, speech clear and coherent TDC c/d/i LFA AVG +B   Home meds:  - asa 77m/ lipitor 40 hs/ benazepril 20 am/ bumex 136mqd/ coreg 25 bid  - allopurinol 300 hs  - symbicort ACT 2 puff bid  - prn's/ vitamins/ supplements  Date                             Creat               egFR   2018                          1.79- 2.38        30- 43   2019                          1.80- 1.81        42-43   January 12, 2020           3.00                    May 13, 2020                2.60                 27   Jul 9                           2.51                 29- 33   Jul 10                         3.42, 3.89    UA none yet   CT abd wo contrast 7/9 - Adrenals/Urinary Tract: Post right nephrectomy. Large volume of hemorrhage in the right nephrectomy bed. Ill-defined air fluid collection tracks in the right retroperitoneum anterior to the iliopsoas muscle and laterally in the abdominal cavity. Hemorrhage tracks into the right aspect of the pelvis in the is only partially included in the field of view. There is moderate subcutaneous emphysema involving the right abdominal wall. Small foci of air dissects between the right abdominal wall musculature. Simple cyst in the left kidney. There is no left hydronephrosis.  CT abd w/ contrast  05/28/2020 1. Slight decrease in size of right retroperitoneal hematoma following right nephrectomy. 2. No enhancing abscess is present. 3. Improved subcutaneous gas over the right side of  the abdomen. 4. Improved bibasilar airspace disease and effusions, right greater than left. 5. Stable moderate dilation of the transverse colon. 6. Stable dilation of the stomach despite OG tube in place 7. Multilevel degenerative changes in the lumbar spine. 8. Aortic Atherosclerosis (ICD10-I70.0).        Recent Labs  Lab 06/03/20 0157 06/03/20 0157 06/04/20 0434 06/04/20 1647 06/07/20 0200 06/08/20 0241  K 4.6   < > 4.4   < > 4.5 4.4  BUN 49*   < > 89*   < > 122* 60*  CREATININE 6.46*   < > 9.32*   < > 10.91* 7.13*  CALCIUM 8.3*   < > 8.2*   < > 8.5* 8.3*  PHOS 3.4  --  4.1  --   --   --   HGB 7.0*   < > 6.8*   < > 8.4* 7.6*   < > = values in this interval not displayed.   Inpatient medications: . arformoterol  15 mcg Nebulization BID  . budesonide (PULMICORT) nebulizer solution  0.5 mg Nebulization BID  . chlorhexidine gluconate (MEDLINE KIT)  15 mL Mouth Rinse BID  . Chlorhexidine Gluconate Cloth  6 each Topical Daily  . darbepoetin (ARANESP) injection - NON-DIALYSIS  200 mcg Subcutaneous Q Sat-1800  . feeding supplement (PROSource TF)  90 mL Per Tube BID  . hydrocortisone  20 mg Oral BID  . insulin aspart  0-9 Units Subcutaneous Q4H  . insulin aspart  4 Units Subcutaneous Q4H  . insulin detemir  18 Units Subcutaneous QHS  . mouth rinse  15 mL Mouth Rinse q12n4p  . midodrine  10 mg Oral TID WC  . sodium chloride flush  10-40 mL Intracatheter Q12H   . sodium chloride Stopped (05/27/20 0829)  . sodium chloride    . sodium chloride    . sodium chloride    . feeding supplement (VITAL 1.5 CAL) Stopped (06/07/20 0654)   sodium chloride, sodium chloride, sodium chloride, acetaminophen **OR** acetaminophen, diphenhydrAMINE **OR** diphenhydrAMINE, heparin, heparin, levalbuterol,  lidocaine, loperamide HCl, menthol-cetylpyridinium, metoprolol tartrate, ondansetron (ZOFRAN) IV, phenol, promethazine, Resource ThickenUp Clear, sodium chloride flush, tiZANidine   Assessment/ Plan: 1. Dialysis dependent AoCKD IV - b/l creat 2.60, eGFR 27 then s/p nephrectomy with resultant progressive renal insufficiency and oliguria.  Followed by [employer].  He is now ESRD.  Using R IJ Temp HD cath.  Off CRRT 7/18, restarted 7/20-7/26.  1. iHD THS: 2K, 3.5h, 2-3L UF, TDC 400/800, No heparin 2. TDC and LFA AVG 7/30 with VVS 3. CLIP in process as ESRD; likely [facility] after CIR 2. Renal mass - sp R nephrectomy 05/16/20; neg margins on path 3. Anemia ckd / abl - on Fe and ESA, neg margins on path; max ESA 4. Shock, resolved 5. Hyponatremia - resolved 6. Hyperkalemia - resolved, 7. Leukocytosis: persistent, afebrile 8. CKD-BMD: P  And Ca ok  Rexene Agent , MD [facility]

## 2020-06-08 NOTE — Progress Notes (Signed)
Mobility Specialist - Progress Note   06/08/20 1134  Mobility  Activity Transferred:  Bed to chair  Level of Assistance Modified independent, requires aide device or extra time  Assistive Device Front wheel walker  Mobility Response Tolerated well  Mobility performed by Mobility specialist;Nurse tech  $Mobility charge 1 Mobility    Pre-mobility: 107 HR, 97% SpO2 Post-mobility: 116 HR, 98%SpO2   Todd Robertson Transport planner Phone: [phone]

## 2020-06-08 NOTE — Progress Notes (Signed)
PROGRESS NOTE  STEVIE CHARTER WNU:[id number] DOB: 1946/01/19 DOA: 05/16/2020 PCP: Monico Blitz, MD   LOS: 23 days   Brief narrative: As per HPI,  Mr Feger is a 74 years old male with a history of right renal mass, prostate cancer, osteoarthritis, cardiomyopathy, CKD, diabetes mellitus type 2, gout, hypertension, asthma presented to hospital for elective nephrectomy. 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  He underwent right nephrectomy on 05/16/2020 with retroperitoneal hematoma.  Creatinine on presentation was 2.6.  Postoperatively, patient developed hypotension and patient was transferred to the ICU.  Patient received vasopressors for hypotension.  Patient was subsequently considered medically stable for transfer out of ICU.  Hospitalist team was consulted for medical management.  Assessment/Plan:  Active Problems:   Cancer of kidney ([place])   S/p nephrectomy   Malnutrition of moderate degree  Hypotension Improved.  On midodrine and hydrocortisone. Dose was decreased yesterday.  Metoprolol has been initiated for atrial fibrillation.  Patient was on Coreg at home.  Need to closely monitor blood pressure.  New onset atrial fibrillation with RVR. On 7/31. As needed beta-blockers. Patient had acute postoperative blood loss anemia with retroperitoneal hematoma. Patient might need anticoagulation. Will need to discuss with the primary team regarding anticoagulation. 2D echocardiogram on 05/28/2020 showed LV ejection fraction of 45 to 50% with global hypokinesis. Grade 2 diastolic dysfunction.  He states that he does follow-up with cardiology in [place].  Hx of asthma. 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  Patient underwent hemodialysis catheter placement and fistula placement for hemodialysis on 06/06/2020.  S/p Rt nephrectomy for renal cell carcinoma. Management as per primary team.  Anemia postoperative from blood loss plus  anemia of chronic disease, retroperitoneal hematoma. Ttransfuse for Hb < 7 or significant bleeding. Status post PRBC I unit on 7/28.  Hemoglobin of 7.6<8.4< 7.7< 7.2< 6.8.  Will need to discuss about anticoagulation with the primary team.  Low grade fever.  Leukocytosis has normalized..  Otherwise does not have other symptoms.  Will closely monitor at this time.  No indication for antibiotic at this time.  Patient spikes fever again could consider blood cultures.  DM type 2 with hyperglycemia  Continue sliding scale insulin, Accu-Cheks diabetic diet.  On Levemir, sliding scale insulin.Marland Kitchen POC glucose of 97. Decrease insulin doses today.  Chronic diastolic CHF, HLD. Continue to monitor.compensated.  Deconditioning - PT/OT has recommended CIR placement.  Dysphagia. - speech therapy recommending regular diet with thin liquids from assessment on 7/26.  Will discontinue cortrak tube since patient is having a lot of trouble eating and having discomfort.  He has improved appetite.  Moderate malnutrition.  Cortrak tube feeding and oral.  Discomfort with cortrak tube will discontinue.  Disposition plan.  CIR,. might need cardiology evaluation.   DVT prophylaxis: SCDs Start: 05/16/20 1601   Code Status: Full code  Family Communication:  I spoke with the patient's daughter Lesia Hausen and updated her about the clinical condition of the patient.   Consultants: Nephrology Urology PCCM Vascular surgery  Procedures: 7/09 Rt nephrectomy 7/13 Rt IJ HD catheter by IR; worsening hypercapnia and AMS >> intubated,  7/14 start CRRT Rt IJ HD catheter 7/13 >> ETT 7/13 >> 7/17 Rt radial a line 7/15 > 7/18 Hemodialysis. Internal jugular hemodialysis catheter placement, left AV graft placement.  On 06/06/2020  Antibiotics:  Cefepime 7/13 >> 7/20 Vancomycin 7/13 >> 7/17   Subjective: Today, patient was seen and examined at bedside.  Patient had atrial fibrillation  with rapid response  yesterday.  He denies any nausea vomiting abdominal pain..  Denies chest pain, shortness of breath.  He feels discomfort in his throat from feeding tube.  objective: Vitals:   06/08/20 0700 06/08/20 1138  BP:  (!) 97/64  Pulse: 72 77  Resp: (!) 33 22  Temp:  98.5 F (36.9 C)  SpO2: 97% 100%    Intake/Output Summary (Last 24 hours) at 06/08/2020 1428 Last data filed at 06/08/2020 0840 Gross per 24 hour  Intake 370 ml  Output --  Net 370 ml   Filed Weights   06/07/20 0720 06/07/20 1145 06/08/20 0619  Weight: (!) 113.8 kg (!) 111.8 kg (!) 115 kg   Body mass index is 32.55 kg/m.   Physical Exam: GENERAL: Alert awake oriented and communicative.  Not in obvious distress.  Cortrak tube tube in place.  Obese HENT: No scleral pallor or icterus. Pupils equally reactive to light. Oral mucosa is moist NECK: is supple, no gross swelling noted.  Right internal jugular hemodialysis catheter in place. CHEST:   Diminished breath sounds bilaterally.  No obvious wheezes or crackles noted. CVS: S1 and S2 heard, no murmur.  Irregular rhythm. ABDOMEN: Soft, non-tender, bowel sounds are present.  Obese abdomen. EXTREMITIES: Trace bilateral lower extremity edema CNS: Cranial nerves are intact. No focal motor deficits. SKIN: warm and dry without rashes.  Data Review: I have personally reviewed the following laboratory data and studies,  CBC: Recent Labs  Lab 06/04/20 0434 06/04/20 0434 06/04/20 1647 06/05/20 0529 06/06/20 0418 06/07/20 0200 06/08/20 0241  WBC 17.4*  --   --  15.2* 13.4* 12.3* 10.4  HGB 6.8*   < > 7.7* 7.2* 7.7* 8.4* 7.6*  HCT 21.5*   < > 24.5* 22.9* 24.0* 25.8* 24.6*  MCV 96.0  --   --  92.7 94.1 94.2 93.5  PLT 256  --   --  242 300 325 277   < > = values in this interval not displayed.   Basic Metabolic Panel: Recent Labs  Lab 06/01/20 1545 06/01/20 1545 06/02/20 0153 06/02/20 0153 06/02/20 2039 06/02/20 2039 06/03/20 0157 06/03/20 0157 06/04/20 0434  06/05/20 0529 06/06/20 0418 06/07/20 0200 06/08/20 0241  NA 137   < > 139   < > 136   < > 135   < > 134* 133* 133* 132* 135  K 4.5   < > 4.4   < > 5.1   < > 4.6   < > 4.4 4.0 4.8 4.5 4.4  CL 104   < > 102   < > 103   < > 102   < > 97* 96* 96* 94* 98  CO2 24   < > 26   < > 22   < > 22   < > 21* 24 21* 19* 23  GLUCOSE 173*   < > 150*   < > 289*   < > 240*   < > 349* 326* 344* 335* 108*  BUN 25*   < > 24*   < > 42*   < > 49*   < > 89* 66* 100* 122* 60*  CREATININE 3.40*   < > 3.26*   < > 5.81*   < > 6.46*   < > 9.32* 7.25* 8.92* 10.91* 7.13*  CALCIUM 8.2*   < > 8.7*   < > 8.2*   < > 8.3*   < > 8.2* 8.2* 8.4* 8.5* 8.3*  MG  --   --  2.6*  --  2.5*  --  2.6*  --   --  2.3  --   --  2.1  PHOS 2.3*  --  2.3*  --  3.5  --  3.4  --  4.1  --   --   --   --    < > = values in this interval not displayed.   Liver Function Tests: Recent Labs  Lab 06/02/20 0153 06/02/20 2039 06/03/20 0157 06/04/20 0434 06/05/20 0529  AST  --   --   --   --  19  ALT  --   --   --   --  18  ALKPHOS  --   --   --   --  66  BILITOT  --   --   --   --  0.7  PROT  --   --   --   --  5.1*  ALBUMIN 2.7* 2.4* 2.5* 2.4* 2.2*   No results for input(s): LIPASE, AMYLASE in the last 168 hours. No results for input(s): AMMONIA in the last 168 hours. Cardiac Enzymes: No results for input(s): CKTOTAL, CKMB, CKMBINDEX, TROPONINI in the last 168 hours. BNP (last 3 results) No results for input(s): BNP in the last 8760 hours.  ProBNP (last 3 results) No results for input(s): PROBNP in the last 8760 hours.  CBG: Recent Labs  Lab 06/07/20 1549 06/07/20 2047 06/08/20 0010 06/08/20 0421 06/08/20 1127  GLUCAP 167* 150* 180* 97 163*   No results found for this or any previous visit (from the past 240 hour(s)).   Studies: DG Chest Port 1V same Day  Result Date: 06/06/2020 CLINICAL DATA:  Postop. EXAM: PORTABLE CHEST 1 VIEW COMPARISON:  05/26/2020 FINDINGS: Right internal jugular central venous catheter tip at the  atrial caval junction. Weighted enteric tube in place with tip below the diaphragm not included in the field of view. No pneumothorax. Stable heart size and mediastinal contours. Improvement in left basilar airspace disease from prior exam. Unchanged right basilar atelectasis. No significant pleural effusion. No other interval change. IMPRESSION: 1. Right-sided dialysis catheter tip at the atrial caval junction. No pneumothorax. 2. Improvement in left basilar airspace disease from prior exam. Unchanged right basilar atelectasis. Electronically Signed   By: Keith Rake M.D.   On: 06/06/2020 19:25   DG Fluoro Guide CV Line-No Report  Result Date: 06/06/2020 Fluoroscopy was utilized by the requesting physician.  No radiographic interpretation.      Flora Lipps, MD  [facility] 06/08/2020

## 2020-06-09 ENCOUNTER — Encounter (HOSPITAL_COMMUNITY): Payer: Self-pay | Admitting: Vascular Surgery

## 2020-06-09 DIAGNOSIS — I5042 Chronic combined systolic (congestive) and diastolic (congestive) heart failure: Secondary | ICD-10-CM

## 2020-06-09 DIAGNOSIS — N186 End stage renal disease: Secondary | ICD-10-CM

## 2020-06-09 DIAGNOSIS — I480 Paroxysmal atrial fibrillation: Secondary | ICD-10-CM

## 2020-06-09 DIAGNOSIS — R0683 Snoring: Secondary | ICD-10-CM

## 2020-06-09 LAB — CBC
HCT: 24.9 % — ABNORMAL LOW (ref 39.0–52.0)
Hemoglobin: 7.9 g/dL — ABNORMAL LOW (ref 13.0–17.0)
MCH: 29.7 pg (ref 26.0–34.0)
MCHC: 31.7 g/dL (ref 30.0–36.0)
MCV: 93.6 fL (ref 80.0–100.0)
Platelets: 321 10*3/uL (ref 150–400)
RBC: 2.66 MIL/uL — ABNORMAL LOW (ref 4.22–5.81)
RDW: 18.4 % — ABNORMAL HIGH (ref 11.5–15.5)
WBC: 9.7 10*3/uL (ref 4.0–10.5)
nRBC: 0.2 % (ref 0.0–0.2)

## 2020-06-09 LAB — GLUCOSE, CAPILLARY
Glucose-Capillary: 109 mg/dL — ABNORMAL HIGH (ref 70–99)
Glucose-Capillary: 118 mg/dL — ABNORMAL HIGH (ref 70–99)
Glucose-Capillary: 135 mg/dL — ABNORMAL HIGH (ref 70–99)
Glucose-Capillary: 144 mg/dL — ABNORMAL HIGH (ref 70–99)
Glucose-Capillary: 148 mg/dL — ABNORMAL HIGH (ref 70–99)
Glucose-Capillary: 148 mg/dL — ABNORMAL HIGH (ref 70–99)
Glucose-Capillary: 150 mg/dL — ABNORMAL HIGH (ref 70–99)

## 2020-06-09 LAB — BASIC METABOLIC PANEL
Anion gap: 15 (ref 5–15)
BUN: 86 mg/dL — ABNORMAL HIGH (ref 8–23)
CO2: 22 mmol/L (ref 22–32)
Calcium: 8.2 mg/dL — ABNORMAL LOW (ref 8.9–10.3)
Chloride: 97 mmol/L — ABNORMAL LOW (ref 98–111)
Creatinine, Ser: 9.68 mg/dL — ABNORMAL HIGH (ref 0.61–1.24)
GFR calc Af Amer: 6 mL/min — ABNORMAL LOW (ref >60)
GFR calc non Af Amer: 5 mL/min — ABNORMAL LOW (ref >60)
Glucose, Bld: 159 mg/dL — ABNORMAL HIGH (ref 70–99)
Potassium: 5 mmol/L (ref 3.5–5.1)
Sodium: 134 mmol/L — ABNORMAL LOW (ref 135–145)

## 2020-06-09 LAB — MAGNESIUM: Magnesium: 2.3 mg/dL (ref 1.7–2.4)

## 2020-06-09 MED ORDER — MIDODRINE HCL 5 MG PO TABS
5.0000 mg | ORAL_TABLET | Freq: Three times a day (TID) | ORAL | Status: DC
  Administered 2020-06-09 – 2020-06-10 (×6): 5 mg via ORAL
  Filled 2020-06-09 (×6): qty 1

## 2020-06-09 MED ORDER — INSULIN ASPART 100 UNIT/ML ~~LOC~~ SOLN
0.0000 [IU] | Freq: Three times a day (TID) | SUBCUTANEOUS | Status: DC
  Administered 2020-06-09 (×2): 1 [IU] via SUBCUTANEOUS

## 2020-06-09 MED ORDER — CHLORHEXIDINE GLUCONATE CLOTH 2 % EX PADS
6.0000 | MEDICATED_PAD | Freq: Every day | CUTANEOUS | Status: DC
  Administered 2020-06-10: 6 via TOPICAL

## 2020-06-09 MED ORDER — INSULIN ASPART 100 UNIT/ML ~~LOC~~ SOLN
2.0000 [IU] | SUBCUTANEOUS | Status: DC
  Administered 2020-06-09 (×3): 2 [IU] via SUBCUTANEOUS

## 2020-06-09 MED ORDER — HYDROCORTISONE 10 MG PO TABS
10.0000 mg | ORAL_TABLET | Freq: Two times a day (BID) | ORAL | Status: DC
  Administered 2020-06-09 – 2020-06-10 (×4): 10 mg via ORAL
  Filled 2020-06-09 (×5): qty 1

## 2020-06-09 MED ORDER — LEVALBUTEROL HCL 0.63 MG/3ML IN NEBU: 0.6300 mg | INHALATION_SOLUTION | Freq: Three times a day (TID) | RESPIRATORY_TRACT | Status: DC | PRN

## 2020-06-09 NOTE — Consult Note (Addendum)
Cardiology Consultation:   Patient ID: Todd Robertson; [id number]; June 06, 1946   Admit date: 05/16/2020 Date of Consult: 06/09/2020  Primary Care Provider: Monico Blitz, MD Primary Cardiologist: Carlyle Dolly, MD 03/31/2020 Primary Electrophysiologist:  None   Patient Profile:   Todd Robertson is a 74 y.o. 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 Cr 2.6 on admit.  Post-op, BP dropped w/ SBP 70s, pt started on IV albumin and IVF. Decreased UOP noted. He was followed by Urology and Nephrology. His Cr kept climbing.  CT abd showed lg volume RP hemorrhage w/ air/fluid going into pelvis and RUQ abd. Small pleural and pericardial effusions seen. He was transfused.  On 07/13, mentation worsened, hyperkalemic w/ K+ 6.1. Treated. I/O strongly positive and +volume overload, developed SOB and hypoxia>>BiPAP>>ETT.  07/14, developed T 102.0, concern for sepsis, ABX started. Hgb dropped to 6.7>>transfused. Cr up to 9.45, CRRT started.  On 07/15, pt went into Afib, controlled vent response. RBBB. Pt again hypotensive, requiring pressors. Not documented exactly when, but pt went back into SR by 07/19.  07/17, pt was extubated. 07/19, episode NSVT. K+ 3.9, Mg 2.8.  Problems w/ persistent shock, felt distributive w/ albumin 2.3, Hgb 6.3, on CRRT. Continued on phenylephrine, started on midodrine. Recurrent anemia requiring transfusions.   Events Recap:  7/09 Rt nephrectomy 7/13 Rt IJ HD catheter by IR; worsening hypercapnia and AMS >> intubated, start ABx for HCAP 7/14 start CRRT ETT 07/13>> 07/17 7/18 off CRRT 07/20 on CRRT 7/26 off CRRT, remains on pressors, Solu-Cortef added, midodrine gradually  titrated down to 5 mg tid 7/27 off pressors, transfer to progressive care 07/30, pt had R tunneled IJ HD cath inserted plus L forearm loop AV Gore-tex graft.  Steroids titrated down.   Afib RVR seen again on 07/31. Started on low-dose metoprolol. Cards asked to see.   Todd Robertson. He has not had any chest pain. He has not been light-headed or dizzy. He has no hx syncope. He has not been SOB w/ exertion.   He has never felt his heart skip or race. Is not aware of any irregular HR now, never felt it, even when HR > 130.    Past Medical History:  Diagnosis Date  . Arthritis   . Cancer [facility])    prostate  . Cardiomyopathy    secondary  . CKD (chronic kidney disease)   . DM2 (diabetes mellitus, type 2) ([place])   . Gout   . HTN (hypertension)    unspec  . Hypercholesterolemia   . Nonischemic cardiomyopathy ([place])    a. EF 40-50% by most recent 2-D echo b. EF 25% by cardiac cath in 2004  . Overweight(278.02)     Past Surgical History:  Procedure Laterality Date  . AV FISTULA PLACEMENT Left 06/06/2020   Procedure: LEFT ARM ARTERIOVENOUS (AV) GRAFT USING 45CM GORTEX;  Surgeon: [name], MD;  Location: [place];  Service: Vascular;  Laterality: Left;  . COLONOSCOPY    . HERNIA REPAIR    . INSERTION OF DIALYSIS CATHETER Right 06/06/2020   Procedure: INSERTION OF DIALYSIS CATHETER USING 23CM DOUBLE LUMEN CATHETER;  Surgeon: Donnetta Hutching,  Arvilla Meres, MD;  Location: [place];  Service: Vascular;  Laterality: Right;  . IR FLUORO GUIDE CV LINE RIGHT  05/20/2020  . IR US GUIDE VASC ACCESS RIGHT  05/20/2020  . KNEE ARTHROPLASTY Left 08/08/2017   Procedure: LEFT TOTAL KNEE ARTHROPLASTY WITH COMPUTER NAVIGATION;  Surgeon: Rod Can, MD;  Location: [place];  Service: Orthopedics;  Laterality: Left;  Needs RNFA  . KNEE ARTHROPLASTY Right 11/17/2017   Procedure: RIGHT TOTAL KNEE ARTHROPLASTY WITH COMPUTER NAVIGATION;  Surgeon: Rod Can, MD;  [facility];  Service:  Orthopedics;  Laterality: Right;  NEEDS RNFA  . ROBOT ASSISTED LAPAROSCOPIC NEPHRECTOMY Right 05/16/2020   Procedure: XI ROBOTIC ASSISTED LAPAROSCOPIC NEPHRECTOMY;  Surgeon: Cleon Gustin, MD;  Location: [facility];  Service: Urology;  Laterality: Right;  2.5 hrs     Prior to Admission medications   Medication Sig Start Date End Date Taking? Authorizing Provider  acetaminophen (TYLENOL) 500 MG tablet Take 1,000 mg by mouth every 6 (six) hours as needed for moderate pain or headache.   Yes [provider]  albuterol (VENTOLIN HFA) 108 (90 Base) MCG/ACT inhaler Inhale 1-2 puffs into the lungs every 6 (six) hours as needed for wheezing or shortness of breath.    Yes [provider]  allopurinol (ZYLOPRIM) 300 MG tablet Take 300 mg by mouth every morning.   Yes [provider]  aspirin EC 81 MG tablet Take 81 mg by mouth daily.   Yes [provider]  atorvastatin (LIPITOR) 40 MG tablet Take 1 tablet (40 mg total) by mouth daily. Patient taking differently: Take 40 mg by mouth every evening.  08/16/14  Yes BranchAlphonse Guild, MD  benazepril (LOTENSIN) 20 MG tablet Take 20 mg by mouth every morning.    Yes [provider]  budesonide-formoterol (SYMBICORT) 160-4.5 MCG/ACT inhaler Inhale 2 puffs into the lungs 2 (two) times daily.   Yes [provider]  bumetanide (BUMEX) 1 MG tablet Take 1 mg by mouth See admin instructions. Take 1 mg daily, may take a second 1 mg dose as needed for swelling   Yes [provider]  carvedilol (COREG) 25 MG tablet Take 1 tablet (25 mg total) by mouth 2 (two) times daily. 08/13/14  Yes BranchAlphonse Guild, MD  Cholecalciferol (VITAMIN D) 50 MCG (2000 UT) tablet Take 2,000 Units by mouth daily.   Yes [provider]  loratadine (CLARITIN) 10 MG tablet Take 10 mg by mouth daily.   Yes [provider]  Multiple Vitamin (MULTIVITAMIN) tablet Take 1 tablet by mouth daily.   Yes [provider]  Tetrahydrozoline HCl (VISINE OP) Place 1 drop into both eyes daily as needed (dryness).   Yes [provider]  diclofenac Sodium (VOLTAREN) 1 % GEL Apply 1 application topically 4 (four) times daily as needed (pain).    [provider]  senna (SENOKOT) 8.6 MG tablet Take 1 tablet by mouth daily as needed for constipation.     [provider]  tiZANidine (ZANAFLEX) 4 MG tablet Take 4 mg by mouth every 8 (eight) hours as needed for muscle spasms.     [provider]    Inpatient Medications: Scheduled Meds: . arformoterol  15 mcg Nebulization BID  . budesonide (PULMICORT) nebulizer solution  0.5 mg Nebulization BID  . chlorhexidine gluconate (MEDLINE KIT)  15 mL Mouth Rinse BID  . Chlorhexidine Gluconate Cloth  6 each Topical Daily  . darbepoetin (ARANESP) injection - NON-DIALYSIS  200 mcg Subcutaneous  Q Sat-1800  . feeding supplement (PROSource TF)  90 mL Per Tube BID  . hydrocortisone  10 mg Oral BID WC  . insulin aspart  0-9 Units Subcutaneous TID AC & HS  . insulin aspart  2 Units Subcutaneous Q4H  . insulin detemir  14 Units Subcutaneous QHS  . mouth rinse  15 mL Mouth Rinse q12n4p  . metoprolol tartrate  12.5 mg Oral BID  . midodrine  5 mg Oral TID WC  . sodium chloride flush  10-40 mL Intracatheter Q12H   Continuous Infusions: . sodium chloride Stopped (05/27/20 0829)  . sodium chloride    . sodium chloride    . sodium chloride    . feeding supplement (VITAL 1.5 CAL) Stopped (06/07/20 0654)   PRN Meds: sodium chloride, sodium chloride, sodium chloride, acetaminophen **OR** acetaminophen, diphenhydrAMINE **OR** diphenhydrAMINE, heparin, heparin, levalbuterol, lidocaine, loperamide HCl, menthol-cetylpyridinium, metoprolol tartrate, ondansetron (ZOFRAN) IV, phenol, promethazine, Resource ThickenUp Clear, sodium chloride flush, tiZANidine  Allergies:   No Known Allergies  Social History:   Social History   Socioeconomic History  .  Marital status: Married    Spouse name: Not on file  . Number of children: 2  . Years of education: Not on file  . Highest education level: Not on file  Occupational History  . Occupation: retired  Tobacco Use  . Smoking status: Former Smoker    Packs/day: 0.50    Years: 20.00    Pack years: 10.00    Types: Cigarettes    Start date: 02/12/1966    Quit date: 11/08/1984    Years since quitting: 35.6  . Smokeless tobacco: Never Used  Vaping Use  . Vaping Use: Never used  Substance and Sexual Activity  . Alcohol use: No    Alcohol/week: 0.0 standard drinks  . Drug use: No  . Sexual activity: Not on file  Other Topics Concern  . Not on file  Social History Narrative   Full time.    Social Determinants of Health   Financial Resource Strain:   . Difficulty of Paying Living Expenses:   Food Insecurity:   . Worried About Charity fundraiser in the Last Year:   . Arboriculturist in the Last Year:   Transportation Needs:   . Film/video editor (Medical):   Marland Kitchen Lack of Transportation (Non-Medical):   Physical Activity:   . Days of Exercise per Week:   . Minutes of Exercise per Session:   Stress:   . Feeling of Stress :   Social Connections:   . Frequency of Communication with Friends and Family:   . Frequency of Social Gatherings with Friends and Family:   . Attends Religious Services:   . Active Member of Clubs or Organizations:   . Attends Archivist Meetings:   Marland Kitchen Marital Status:   Intimate Partner Violence:   . Fear of Current or Ex-Partner:   . Emotionally Abused:   Marland Kitchen Physically Abused:   . Sexually Abused:     Family History:   Family History  Problem Relation Age of Onset  . Hypertension Mother   . Kidney disease Mother   . Heart disease Father   . Kidney disease Other    Family Status:  Family Status  Relation Name Status  . Mother  (Not Specified)  . Father  (Not Specified)  . Other brother Other    ROS:  Please see the history of present  illness.  All other ROS reviewed and  negative.     Physical Exam/Data:   Vitals:   06/09/20 0520 06/09/20 0800 06/09/20 0922 06/09/20 1124  BP:  (!) 105/55  109/84  Pulse: 82 80  79  Resp: 18 18  (!) 24  Temp:  98.8 F (37.1 C)  (!) 97.5 F (36.4 C)  TempSrc:  Oral  Axillary  SpO2: 97% 97% 99% 98%  Weight:      Height:        Intake/Output Summary (Last 24 hours) at 06/09/2020 1332 Last data filed at 06/08/2020 2111 Gross per 24 hour  Intake 50 ml  Output 0 ml  Net 50 ml    Last 3 Weights 06/08/2020 06/07/2020 06/07/2020  Weight (lbs) 253 lb 8.5 oz 246 lb 7.6 oz 250 lb 14.1 oz  Weight (kg) 115 kg 111.8 kg 113.8 kg     Body mass index is 32.55 kg/m.   General:  Well nourished, well developed, male in no acute distress HEENT: normal Lymph: no adenopathy Neck: JVD - mildly elevated Endocrine:  No thryomegaly Vascular: No carotid bruits; 4/4 extremity pulses 2+  Cardiac:  normal S1, S2; Irreg R&R; no murmur Lungs:  Decreased BS bases bilaterally, no wheezing, rhonchi, few rales  Abd: soft, nontender, no hepatomegaly, L forearm  Ext: no edema Musculoskeletal:  No deformities, BUE and BLE strength normal and equal Skin: warm and dry  Neuro:  CNs 2-12 intact, no focal abnormalities noted Psych:  Normal affect   EKG:  The EKG was personally reviewed and demonstrates:   07/12 ECG is ?SR w/ very long 1st degree block, HR 87, RBBB (old) 07/15 ECG is Afib, HR 85 07/21 ECG is SR, HR 83, RBBB, LAFB 07/31 ECG is coarse Afib vs A flutter, HR 132, RBBB  Telemetry:  All available strips reviewed 07/15 Afib, 07/18 Afib, 07/19 SR, 07/28 Afib, 07/29 SR, 07/31 Afib w/ 4 bts NSVT telemetry available since 07/31 is all atrial Robertson vs flutter   CV studies:   ECHO: 05/28/2020 1. Left ventricular ejection fraction, by estimation, is 45 to 50%. The  left ventricle has mildly decreased function. The left ventricle  demonstrates global hypokinesis. There is mild left ventricular    hypertrophy. Left ventricular diastolic parameters  are consistent with Grade II diastolic dysfunction (pseudonormalization).  2. Right ventricular systolic function is normal. The right ventricular  size is mildly enlarged. Tricuspid regurgitation signal is inadequate for  assessing PA pressure.  3. Right atrial size was mildly dilated.  4. The mitral valve is normal in structure. No evidence of mitral valve  regurgitation. No evidence of mitral stenosis.  5. The aortic valve is tricuspid. Aortic valve regurgitation is not  visualized. Mild aortic valve sclerosis is present, with no evidence of  aortic valve stenosis.  6. Aortic dilatation noted. There is mild dilatation of the ascending  aorta measuring 41 mm.  7. Technically difficult study with poor images.    ECHO: 08/09/2017  - Left ventricle: The cavity size was normal. Wall thickness was  increased in a pattern of moderate LVH. Systolic function was  normal. The estimated ejection fraction was in the range of 55%  to 60%. Wall motion was normal; there were no regional wall  motion abnormalities. Doppler parameters are consistent with  abnormal left ventricular relaxation (grade 1 diastolic  dysfunction).  - Mitral valve: There was mild regurgitation.  - Right atrium: The atrium was mildly dilated.    Laboratory Data:   Chemistry Recent Labs  Lab 06/07/20 0200  06/08/20 0241 06/09/20 0123  NA 132* 135 134*  K 4.5 4.4 5.0  CL 94* 98 97*  CO2 19* 23 22  GLUCOSE 335* 108* 159*  BUN 122* 60* 86*  CREATININE 10.91* 7.13* 9.68*  CALCIUM 8.5* 8.3* 8.2*  GFRNONAA 4* 7* 5*  GFRAA 5* 8* 6*  ANIONGAP 19* 14 15    Lab Results  Component Value Date   ALT 18 06/05/2020   AST 19 06/05/2020   ALKPHOS 66 06/05/2020   BILITOT 0.7 06/05/2020   Hematology Recent Labs  Lab 06/07/20 0200 06/08/20 0241 06/09/20 0123  WBC 12.3* 10.4 9.7  RBC 2.74* 2.63* 2.66*  HGB 8.4* 7.6* 7.9*  HCT 25.8* 24.6* 24.9*   MCV 94.2 93.5 93.6  MCH 30.7 28.9 29.7  MCHC 32.6 30.9 31.7  RDW 18.8* 19.2* 18.4*  PLT 325 277 321   Cardiac Enzymes High Sensitivity Troponin:   Recent Labs  Lab 05/20/20 0324 05/20/20 0503 05/22/20 0115 05/22/20 0258  TROPONINIHS 66* 53* 31* 36*      BNPNo results for input(s): BNP, PROBNP in the last 168 hours.  DDimer No results for input(s): DDIMER in the last 168 hours. TSH: No results found for: TSH Lipids:No results found for: CHOL, HDL, LDLCALC, LDLDIRECT, TRIG, CHOLHDL HgbA1c: Lab Results  Component Value Date   HGBA1C 6.2 (H) 05/13/2020   Magnesium:  Magnesium  Date Value Ref Range Status  06/09/2020 2.3 1.7 - 2.4 mg/dL Final    Comment:    Performed at [facility], [place] [address] [place], [place] [address]     Radiology/Studies:  [facility] Chest Port 1V same Day  Result Date: 06/06/2020 CLINICAL DATA:  Postop. EXAM: PORTABLE CHEST 1 VIEW COMPARISON:  05/26/2020 FINDINGS: Right internal jugular central venous catheter tip at the atrial caval junction. Weighted enteric tube in place with tip below the diaphragm not included in the field of view. No pneumothorax. Stable heart size and mediastinal contours. Improvement in left basilar airspace disease from prior exam. Unchanged right basilar atelectasis. No significant pleural effusion. No other interval change. IMPRESSION: 1. Right-sided dialysis catheter tip at the atrial caval junction. No pneumothorax. 2. Improvement in left basilar airspace disease from prior exam. Unchanged right basilar atelectasis. Electronically Signed   By: Keith Rake M.D.   On: 06/06/2020 19:25   DG Fluoro Guide CV Line-No Report  Result Date: 06/06/2020 Fluoroscopy was utilized by the requesting physician.  No radiographic interpretation.    Assessment and Plan:   1. Persistent atrial Robertson: - explained that this is not unusual in the setting of acute illness - He has had 2 doses metoprolol 12.5 mg bid, HR is in the 70s-80s  at rest - explained to family that anticoag for stroke prevention indicated, but risks probably outweigh benefits right now.  - CHA2DS2-VASc = 4 (age x 1, DM, HTN, CM/CHF) - HAS-BLED score is 4, high risk of major bleeding  2. Hypotension:  - he was on pressors, then midodrine 15 mg tid>> midodrine 10 mg tid>> on midodrine 5 mg tid starting today. - SBP 90s-109 today, continue to follow - not sure his BP is high enough to get metoprolol on HD days  3. ESRD on HD - Cr 2.6 on admit, has been > 10 at times - on HD through tunneled right IJ catheter - has L forearm graft, done 07/30.  - I do not get a thrill and L forearm very tender, discuss VVS seeing w/ MD  4. CM -  EF as low as 25% 2004>> 2018 echo w/ EF 55-60% - echo this admit above, EF 45-50% - on BB, BP too low for Imdur/nitrates - poor renal function so no ACE/ARB or spiro - volume mgt w/ HD  Otherwise, per IM Active Problems:   Cancer of kidney [facility])   S/p nephrectomy   Malnutrition of moderate degree   For questions or updates, please contact [facility] Please consult [email or web address] for contact info under Cardiology/STEMI.   Signed, Rosaria Ferries, PA-C  06/09/2020 1:32 PM    Patient seen and examined. Agree with assessment and plan.  Todd Robertson is a 74 year old African-American male who has a history of previous nonischemic cardiomyopathy with EF at 25 to 30% in 2004 with subsequent improvement in 2018.  He has a history of hypertension, diabetes mellitus, hyperlipidemia, prostate CA, and was recently diagnosed with papillary renal cell carcinoma leading to elective right nephrectomy on May 16, 2020.  His course was complicated by retroperitoneal hemorrhage, subsequent electrolyte abnormalities with hyperkalemia overload, and progressive shortness of breath, hypoxemia resulting in BiPAP and subsequent ETT.  He developed worsening renal function and required CRRT.  His last dialysis was on Saturday and he is  planned for dialysis tomorrow.  He went into atrial fibrillation on July 15 with subsequent restoration of sinus rhythm on July 19.  Again is back in atrial fibrillation with a ventricular rate he has been demonstrated to have episodes of atrial fibrillation in the 80s.  He is on low-dose metoprolol 12.5 mg.  He had previously developed hypotension ultimately weaned off pressors and is now on reduced dose of midodrine 5 mg 3 times per day with systolic blood pressures around 100.  He denies any chest pain.  He denies any awareness of being in atrial fibrillation.  Upon questioning, the patient family admits to significant snoring.  The patient cannot sleep on his back and typically sleeps on his side.  His body habitus is highly suggestive of obstructive sleep apnea the Mallampati scale of 4, large neck, and obesity.  His most recent EF has slightly reduced to 45 to 50% and there is grade 2 diastolic dysfunction, mild aortic sclerosis and mild aortic root dilatation.Marland Kitchen  He denies any chest pain.  On exam hopefully stabilizes, we will plan to slightly titrate beta-blocker therapy for improved rate control.  Currently is 110/84.  Morbidly obese.  Mallampati scale 4.  Thick neck.  No JVD.  Lungs clear without wheezing.  Rhythm irregularly irregular with a controlled ventricular rate now in the 80s to 90s.  Nontender abdomen, no significant edema.  Left forearm is mildly tender at fistula site.  A thrill was present but the amplitudes appears somewhat diminished.  Recommend this is reassessed by nephrology/ VVS.  With recent retroperitoneal bleed, and high bleed risk, he is not on systemic anticoagulation.  Plan is for dialysis tomorrow.  Will follow.  Consider outpatient sleep study evaluation.   Troy Sine, MD, [facility] 06/09/2020 5:45 PM

## 2020-06-09 NOTE — Progress Notes (Signed)
3 Days Post-Op Subjective: Patient reports good pain control, ambulating in halls. Pt had Afib with RVR on 7/31. Started on metoprolol Objective: Vital signs in last 24 hours: Temp:  [97.5 F (36.4 C)-99.4 F (37.4 C)] 97.5 F (36.4 C) (08/02 1124) Pulse Rate:  [76-82] 79 (08/02 1124) Resp:  [18-25] 24 (08/02 1124) BP: (93-119)/(54-84) 109/84 (08/02 1124) SpO2:  [96 %-99 %] 98 % (08/02 1124) FiO2 (%):  [21 %] 21 % (08/01 1929)  Intake/Output from previous day: 08/01 0701 - 08/02 0700 In: 290 [P.O.:290] Out: 0  Intake/Output this shift: No intake/output data recorded.  Physical Exam:  General:alert, cooperative and appears stated age GI: soft, non tender, normal bowel sounds, no palpable masses, no organomegaly, no inguinal hernia Male genitalia: not done Extremities: extremities normal, atraumatic, no cyanosis or edema  Lab Results: Recent Labs    06/07/20 0200 06/08/20 0241 06/09/20 0123  HGB 8.4* 7.6* 7.9*  HCT 25.8* 24.6* 24.9*   BMET Recent Labs    06/08/20 0241 06/09/20 0123  NA 135 134*  K 4.4 5.0  CL 98 97*  CO2 23 22  GLUCOSE 108* 159*  BUN 60* 86*  CREATININE 7.13* 9.68*  CALCIUM 8.3* 8.2*   No results for input(s): LABPT, INR in the last 72 hours. No results for input(s): LABURIN in the last 72 hours. Results for orders placed or performed during the hospital encounter of 05/16/20  MRSA PCR Screening     Status: None   Collection Time: 05/16/20  4:34 PM   Specimen: Nasal Mucosa; Nasopharyngeal  Result Value Ref Range Status   MRSA by PCR NEGATIVE NEGATIVE Final    Comment:        The GeneXpert MRSA Assay (FDA approved for NASAL specimens only), is one component of a comprehensive MRSA colonization surveillance program. It is not intended to diagnose MRSA infection nor to guide or monitor treatment for MRSA infections. Performed at [facility], [place] [address], [place], [place] [address]   Culture, blood (routine x 2)      Status: None   Collection Time: 05/21/20  3:36 AM   Specimen: BLOOD  Result Value Ref Range Status   Specimen Description BLOOD LEFT ANTECUBITAL  Final   Special Requests   Final    BOTTLES DRAWN AEROBIC ONLY Blood Culture adequate volume   Culture   Final    NO GROWTH 5 DAYS Performed at [facility], [place] [address], [place], [place] [address]    Report Status 05/26/2020 FINAL  Final  Culture, blood (routine x 2)     Status: None   Collection Time: 05/21/20  3:36 AM   Specimen: BLOOD LEFT FOREARM  Result Value Ref Range Status   Specimen Description BLOOD LEFT FOREARM  Final   Special Requests   Final    BOTTLES DRAWN AEROBIC AND ANAEROBIC Blood Culture adequate volume   Culture   Final    NO GROWTH 5 DAYS Performed at [facility], [place] [address] [place], [place] [address]    Report Status 05/26/2020 FINAL  Final  Culture, respiratory (non-expectorated)     Status: None   Collection Time: 05/21/20  4:25 AM   Specimen: Tracheal Aspirate; Respiratory  Result Value Ref Range Status   Specimen Description TRACHEAL ASPIRATE  Final   Special Requests NONE  Final   Gram Stain   Final    FEW WBC PRESENT, PREDOMINANTLY PMN RARE BUDDING YEAST SEEN    Culture  Final    FEW Consistent with normal respiratory flora. Performed at [facility], [place] [address] [place], [place] [address]    Report Status 05/23/2020 FINAL  Final  Culture, blood (routine x 2)     Status: None   Collection Time: 05/27/20  3:00 PM   Specimen: BLOOD LEFT HAND  Result Value Ref Range Status   Specimen Description BLOOD LEFT HAND  Final   Special Requests   Final    BOTTLES DRAWN AEROBIC ONLY Blood Culture adequate volume   Culture   Final    NO GROWTH 5 DAYS Performed at [facility], [place] [address], [place], [place] [address]    Report Status 06/01/2020 FINAL  Final  Culture, blood (routine x 2)     Status: None   Collection Time: 05/27/20  3:15 PM   Specimen: BLOOD  RIGHT HAND  Result Value Ref Range Status   Specimen Description BLOOD RIGHT HAND  Final   Special Requests IN PEDIATRIC BOTTLE Blood Culture adequate volume  Final   Culture   Final    NO GROWTH 5 DAYS Performed at [facility], [place] [address] [place], [place] [address]    Report Status 06/01/2020 FINAL  Final    Studies/Results: No results found.  Assessment/Plan: POD#24 Right radical nephrectomy  1. Continue HD per nephrology 2. Continue PT/OT, patient is awaiting IP rehab placement 3. 3. Afib management per Hospitalist service    LOS: 24 days   Nicolette Bang 06/09/2020, 11:39 AM

## 2020-06-09 NOTE — Plan of Care (Signed)
  Problem: Education: Goal: Knowledge of General Education information will improve Description: Including pain rating scale, medication(s)/side effects and non-pharmacologic comfort measures Outcome: Progressing   Problem: Activity: Goal: Risk for activity intolerance will decrease Outcome: Progressing  Discharge plan for CIR vs Home health discussed with family, family teaching back action of medciations. Patient ambulating in hall with physical therapy, able to pull self up in bed.

## 2020-06-09 NOTE — Progress Notes (Signed)
Physical Therapy Treatment Patient Details Name: Todd Robertson MRN: [id number] DOB: 1946-10-03 Today's Date: 06/09/2020    History of Present Illness 74 yo male presenting 7/9 for planned right Nephrectomy. Post op pt had large volume of hemorrhage in R nephrectomy, R retoperitoneum anerior ro the ilipsoas muslce and latral abdominal cavity.  Pt initiated dialysis. Pt received LT AV fistula for HD on 7/31.  PMH  h/o prostate CA, DM2, gout, HTN    PT Comments    Pt more fatigued than last session. Continue to feel pt could benefit from CIR stay to increase his independence and activity tolerance prior to return home.    Follow Up Recommendations  CIR     Equipment Recommendations  Other (comment) (TBD)    Recommendations for Other Services       Precautions / Restrictions Precautions Precautions: Fall Restrictions Weight Bearing Restrictions: No    Mobility  Bed Mobility Overal bed mobility: Needs Assistance Bed Mobility: Supine to Sit     Supine to sit: Min assist;HOB elevated     General bed mobility comments: Assist to elevate trunk into sitting  Transfers Overall transfer level: Needs assistance Equipment used: Rolling walker (2 wheeled) Transfers: Sit to/from [facility] Sit to Stand: From elevated surface;Min assist Stand pivot transfers: Min assist       General transfer comment: Assist to bring hips up and for balance. Verbal cues for hand placement.  Ambulation/Gait Ambulation/Gait assistance: Min assist Gait Distance (Feet): 45 Feet (15' x 1, 30' x 1, 45' x 1) Assistive device: Rolling walker (2 wheeled) Gait Pattern/deviations: Step-through pattern;Decreased stride length;Trunk flexed Gait velocity: decr Gait velocity interpretation: <1.8 ft/sec, indicate of risk for recurrent falls General Gait Details: Assist for balance and support. Pt fatigued quickly. Brought rollator behind pt for pt to sit for frequent rest  breaks.   Stairs             Wheelchair Mobility    Modified Rankin (Stroke Patients Only)       Balance Overall balance assessment: Needs assistance Sitting-balance support: Feet supported Sitting balance-Leahy Scale: Fair     Standing balance support: Bilateral upper extremity supported Standing balance-Leahy Scale: Poor Standing balance comment: walker and min guard for static standing                            Cognition Arousal/Alertness: Awake/alert Behavior During Therapy: WFL for tasks assessed/performed;Flat affect Overall Cognitive Status: Within Functional Limits for tasks assessed                                        Exercises      General Comments General comments (skin integrity, edema, etc.): Pt with HR 90-114 with amb. Dyspnea 3/4 with activity.SpO2 >94% on RA.      Pertinent Vitals/Pain Pain Assessment: No/denies pain    Home Living                      Prior Function            PT Goals (current goals can now be found in the care plan section) Acute Rehab PT Goals Patient Stated Goal: independence Progress towards PT goals: Not progressing toward goals - comment (more fatigued today)    Frequency    Min 3X/week      PT Plan  Current plan remains appropriate    Co-evaluation              AM-PAC PT "6 Clicks" Mobility   Outcome Measure  Help needed turning from your back to your side while in a flat bed without using bedrails?: A Little Help needed moving from lying on your back to sitting on the side of a flat bed without using bedrails?: A Little Help needed moving to and from a bed to a chair (including a wheelchair)?: A Little Help needed standing up from a chair using your arms (e.g., wheelchair or bedside chair)?: A Little Help needed to walk in hospital room?: A Little Help needed climbing 3-5 steps with a railing? : A Lot 6 Click Score: 17    End of Session Equipment  Utilized During Treatment: Gait belt Activity Tolerance: Patient limited by fatigue Patient left: with call bell/phone within reach;in chair (sitting EOB) Nurse Communication: Mobility status PT Visit Diagnosis: Other abnormalities of gait and mobility (R26.89);Unsteadiness on feet (R26.81)     Time: 9147-8295 PT Time Calculation (min) (ACUTE ONLY): 20 min  Charges:  $Gait Training: 8-22 mins                     [place] Pager [phone] Office [place] 06/09/2020, 11:38 AM

## 2020-06-09 NOTE — Progress Notes (Signed)
IP rehab admissions:  I met with patient and his wife/daughter at the bedside.  They continue to be interested in CIR.  I spoke with PT, [place], who says patient still needs CIR and did not do as well today as on Friday.  I will have rehab MD review notes in am to possible acute inpatient rehab admission.  I will follow up in am.  Call me for questions.  [phone]

## 2020-06-09 NOTE — Progress Notes (Addendum)
PROGRESS NOTE  TRUE GARCIAMARTINEZ YIF:[id number] DOB: [birth year] DOA: 05/16/2020 PCP: Monico Blitz, MD   LOS: 24 days   Brief narrative: As per HPI,  Mr Bisig is a 74 years old male with a history of right renal mass, prostate cancer, osteoarthritis, cardiomyopathy, CKD, diabetes mellitus type 2, gout, hypertension, asthma presented to hospital for elective nephrectomy. 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  He underwent right nephrectomy on 05/16/2020 with retroperitoneal hematoma.  Creatinine on presentation was 2.6.  Postoperatively, patient developed hypotension and patient was transferred to the ICU.  Patient received vasopressors for hypotension.  Patient was subsequently considered medically stable for transfer out of ICU.  Hospitalist team was consulted for medical management.  Assessment/Plan:  Active Problems:   Cancer of kidney ([place])   S/p nephrectomy   Malnutrition of moderate degree  Hypotension Improved.  On midodrine and hydrocortisone. Will decrease doses today. Taper steroids  Metoprolol has been initiated for atrial fibrillation at low-dose..  Patient was on Coreg at home.  Need to closely monitor blood pressure.  New onset atrial fibrillation with RVR. On 7/31. Controlled.  On low-dose metoprolol.  x2D echocardiogram on 05/28/2020 showed LV ejection fraction of 45 to 50% with global hypokinesis. Grade 2 diastolic dysfunction.  He states that he does follow-up with cardiology in [place]. Spoke with primary team about anticoagulation and patient will be okay for anticoagulant.  I spoke with cardiology on-call.  Will review the patient with cardiology regarding further evaluation medication management and anticoagulation.  Add TSH.  Hx of asthma. Continue pulmicort, brovana, albuterol changed to Xopenex decreased frequency to every 8 hourly  ESRD. - off CRRT 7/26.  Patient underwent hemodialysis catheter placement and fistula placement for  hemodialysis on 06/06/2020.  S/p Rt nephrectomy for renal cell carcinoma. Management as per primary team.  Anemia postoperative from blood loss plus anemia of chronic disease, retroperitoneal hematoma. Ttransfuse for Hb < 7 or significant bleeding. Status post PRBC I unit on 7/28.  Hemoglobin trend of  7.9 today.  Trend has been 7.6-8.4- 7.7- 7.2<-6.8.   Low grade fever.  Leukocytosis has normalized..  If patient spikes fever again, could consider blood cultures.  DM type 2 with hyperglycemia  Continue sliding scale insulin, Accu-Cheks diabetic diet.  On Levemir, sliding scale insulin.Marland Kitchen POC glucose of 97. Decreased insulin doses with decreasing steroids.  Chronic diastolic CHF, HLD. Continue to monitor.compensated.  Deconditioning - PT/OT has recommended CIR placement.  Dysphagia. - speech therapy recommending regular diet with thin liquids from assessment on 7/26.  Off cortrak tube.  Moderate malnutrition.  DCed -cortak tube yesterday  Disposition plan.  CIR,   DVT prophylaxis: SCDs Start: 05/16/20 1601   Code Status: Full code  Family Communication:.  None today. I spoke with the patient's daughter Lesia Hausen yesterday on the phone  Consultants: Nephrology Urology PCCM Vascular surgery Cardiology  Procedures: 7/09 Rt nephrectomy 7/13 Rt IJ HD catheter by IR; worsening hypercapnia and AMS >> intubated,  7/14 start CRRT Rt IJ HD catheter 7/13 >> ETT 7/13 >> 7/17 Rt radial a line 7/15 > 7/18 Hemodialysis. Internal jugular hemodialysis catheter placement, left AV graft placement.  On 06/06/2020  Antibiotics:  Cefepime 7/13 >> 7/20 Vancomycin 7/13 >> 7/17  Subjective: Today, denies any chest pain, shortness of breath, fever or chills.  Denies any dizziness but feels weak and fatigued.  Throat discomfort is better after  objective: Vitals:   06/09/20 0515 06/09/20 0520  BP:    Pulse: 81 82  Resp: 22 18  Temp:    SpO2: 98% 97%    Intake/Output  Summary (Last 24 hours) at 06/09/2020 0753 Last data filed at 06/08/2020 2111 Gross per 24 hour  Intake 290 ml  Output 0 ml  Net 290 ml   Filed Weights   06/07/20 0720 06/07/20 1145 06/08/20 0619  Weight: (!) 113.8 kg (!) 111.8 kg (!) 115 kg   Body mass index is 32.55 kg/m.   Physical Exam:  GENERAL: Alert awake oriented and communicative.  Not in obvious distress.    Obese HENT: No scleral pallor or icterus. Pupils equally reactive to light. Oral mucosa is moist NECK: is supple, no gross swelling noted.  Right internal jugular hemodialysis catheter in place. CHEST:   Diminished breath sounds bilaterally.  No obvious wheezes or crackles noted. CVS: S1 and S2 heard, no murmur.  Irregular rhythm.  ABDOMEN: Soft, non-tender, bowel sounds are present.  Obese abdomen. EXTREMITIES: Trace bilateral lower extremity edema CNS: Cranial nerves are intact. No focal motor deficits. SKIN: warm and dry without rashes.  Data Review: I have personally reviewed the following laboratory data and studies,  CBC: Recent Labs  Lab 06/05/20 0529 06/06/20 0418 06/07/20 0200 06/08/20 0241 06/09/20 0123  WBC 15.2* 13.4* 12.3* 10.4 9.7  HGB 7.2* 7.7* 8.4* 7.6* 7.9*  HCT 22.9* 24.0* 25.8* 24.6* 24.9*  MCV 92.7 94.1 94.2 93.5 93.6  PLT 242 300 325 277 588   Basic Metabolic Panel: Recent Labs  Lab 06/02/20 2039 06/02/20 2039 06/03/20 0157 06/03/20 0157 06/04/20 0434 06/04/20 0434 06/05/20 0529 06/06/20 0418 06/07/20 0200 06/08/20 0241 06/09/20 0123  NA 136   < > 135   < > 134*   < > 133* 133* 132* 135 134*  K 5.1   < > 4.6   < > 4.4   < > 4.0 4.8 4.5 4.4 5.0  CL 103   < > 102   < > 97*   < > 96* 96* 94* 98 97*  CO2 22   < > 22   < > 21*   < > 24 21* 19* 23 22  GLUCOSE 289*   < > 240*   < > 349*   < > 326* 344* 335* 108* 159*  BUN 42*   < > 49*   < > 89*   < > 66* 100* 122* 60* 86*  CREATININE 5.81*   < > 6.46*   < > 9.32*   < > 7.25* 8.92* 10.91* 7.13* 9.68*  CALCIUM 8.2*   < > 8.3*   < >  8.2*   < > 8.2* 8.4* 8.5* 8.3* 8.2*  MG 2.5*  --  2.6*  --   --   --  2.3  --   --  2.1 2.3  PHOS 3.5  --  3.4  --  4.1  --   --   --   --   --   --    < > = values in this interval not displayed.   Liver Function Tests: Recent Labs  Lab 06/02/20 2039 06/03/20 0157 06/04/20 0434 06/05/20 0529  AST  --   --   --  19  ALT  --   --   --  18  ALKPHOS  --   --   --  66  BILITOT  --   --   --  0.7  PROT  --   --   --  5.1*  ALBUMIN 2.4* 2.5*  2.4* 2.2*   No results for input(s): LIPASE, AMYLASE in the last 168 hours. No results for input(s): AMMONIA in the last 168 hours. Cardiac Enzymes: No results for input(s): CKTOTAL, CKMB, CKMBINDEX, TROPONINI in the last 168 hours. BNP (last 3 results) No results for input(s): BNP in the last 8760 hours.  ProBNP (last 3 results) No results for input(s): PROBNP in the last 8760 hours.  CBG: Recent Labs  Lab 06/08/20 1127 06/08/20 1733 06/08/20 2027 06/09/20 0031 06/09/20 0443  GLUCAP 163* 158* 160* 150* 144*   No results found for this or any previous visit (from the past 240 hour(s)).   Studies: No results found.    Flora Lipps, MD  [facility] 06/09/2020

## 2020-06-09 NOTE — Progress Notes (Signed)
Subjective:  BP soft in the 90s- he says he feels better-  NGT is out- appetite is coming around Objective Vital signs in last 24 hours: Vitals:   06/08/20 2029 06/09/20 0425 06/09/20 0515 06/09/20 0520  BP: (!) 119/57 (!) 97/54    Pulse: 81 76 81 82  Resp: (!) 25 23 22 18   Temp: 99.4 F (37.4 C) 98.5 F (36.9 C)    TempSrc: Oral Oral    SpO2: 96% 99% 98% 97%  Weight:      Height:       Weight change:   Intake/Output Summary (Last 24 hours) at 06/09/2020 0748 Last data filed at 06/08/2020 2111 Gross per 24 hour  Intake 290 ml  Output 0 ml  Net 290 ml    Assessment/ Plan: Pt is Todd 74 y.o. yo male with advanced CKD at baseline who was admitted on 05/16/2020 with need for nephrectomy  Assessment/Plan: 1. RCC-  Need for nephrectomy - done on 7/9- c/b bleeding and progression to ESRD 2. ESRD - new diagnosis this admit-  HD initiated-  On Todd TTS schedule- next due tomorrow-  Has TDC and AVG placed 7/30.  CLIP in process-  Family desires [facility] 3. Anemia- hgb in the 7's-  On Darbe 200 weekly and s/p iron repletion 4. Secondary hyperparathyroidism- PTH 116, calc and phos WNL-  No meds 5. HTN/volume-  req midodrine for BP support 6. Malnutrition-  On supps   Todd Robertson Todd Robertson    Labs: Basic Metabolic Panel: Recent Labs  Lab 06/02/20 2039 06/02/20 2039 06/03/20 0157 06/03/20 0157 06/04/20 0434 06/05/20 0529 06/07/20 0200 06/08/20 0241 06/09/20 0123  NA 136   < > 135   < > 134*   < > 132* 135 134*  K 5.1   < > 4.6   < > 4.4   < > 4.5 4.4 5.0  CL 103   < > 102   < > 97*   < > 94* 98 97*  CO2 22   < > 22   < > 21*   < > 19* 23 22  GLUCOSE 289*   < > 240*   < > 349*   < > 335* 108* 159*  BUN 42*   < > 49*   < > 89*   < > 122* 60* 86*  CREATININE 5.81*   < > 6.46*   < > 9.32*   < > 10.91* 7.13* 9.68*  CALCIUM 8.2*   < > 8.3*   < > 8.2*   < > 8.5* 8.3* 8.2*  PHOS 3.5  --  3.4  --  4.1  --   --   --   --    < > = values in this interval not displayed.   Liver Function  Tests: Recent Labs  Lab 06/03/20 0157 06/04/20 0434 06/05/20 0529  AST  --   --  19  ALT  --   --  18  ALKPHOS  --   --  66  BILITOT  --   --  0.7  PROT  --   --  5.1*  ALBUMIN 2.5* 2.4* 2.2*   No results for input(s): LIPASE, AMYLASE in the last 168 hours. No results for input(s): AMMONIA in the last 168 hours. CBC: Recent Labs  Lab 06/05/20 0529 06/05/20 0529 06/06/20 0418 06/06/20 0418 06/07/20 0200 06/08/20 0241 06/09/20 0123  WBC 15.2*   < > 13.4*   < > 12.3* 10.4 9.7  HGB 7.2*   < > 7.7*   < > 8.4* 7.6* 7.9*  HCT 22.9*   < > 24.0*   < > 25.8* 24.6* 24.9*  MCV 92.7  --  94.1  --  94.2 93.5 93.6  PLT 242   < > 300   < > 325 277 321   < > = values in this interval not displayed.   Cardiac Enzymes: No results for input(s): CKTOTAL, CKMB, CKMBINDEX, TROPONINI in the last 168 hours. CBG: Recent Labs  Lab 06/08/20 1127 06/08/20 1733 06/08/20 2027 06/09/20 0031 06/09/20 0443  GLUCAP 163* 158* 160* 150* 144*    Iron Studies: No results for input(s): IRON, TIBC, TRANSFERRIN, FERRITIN in the last 72 hours. Studies/Results: No results found. Medications: Infusions: . sodium chloride Stopped (05/27/20 0829)  . sodium chloride    . sodium chloride    . sodium chloride    . feeding supplement (VITAL 1.5 CAL) Stopped (06/07/20 0654)    Scheduled Medications: . arformoterol  15 mcg Nebulization BID  . budesonide (PULMICORT) nebulizer solution  0.5 mg Nebulization BID  . chlorhexidine gluconate (MEDLINE KIT)  15 mL Mouth Rinse BID  . Chlorhexidine Gluconate Cloth  6 each Topical Daily  . darbepoetin (ARANESP) injection - NON-DIALYSIS  200 mcg Subcutaneous Q Sat-1800  . feeding supplement (PROSource TF)  90 mL Per Tube BID  . hydrocortisone  20 mg Oral BID  . insulin aspart  0-9 Units Subcutaneous Q4H  . insulin aspart  3 Units Subcutaneous Q4H  . insulin detemir  14 Units Subcutaneous QHS  . mouth rinse  15 mL Mouth Rinse q12n4p  . metoprolol tartrate  12.5 mg  Oral BID  . midodrine  10 mg Oral TID WC  . sodium chloride flush  10-40 mL Intracatheter Q12H    have reviewed scheduled and prn medications.  Physical Exam: General: obese, pleasant-  Minimal mobility- NAD Heart: RRR Lungs: mostly clear Abdomen: obese, soft, non tender Extremities: min edema Dialysis Access: left AVG-  thrill - right TDC    06/09/2020,7:48 AM  LOS: 24 days

## 2020-06-10 ENCOUNTER — Inpatient Hospital Stay (HOSPITAL_COMMUNITY)
Admission: RE | Admit: 2020-06-10 | Discharge: 2020-06-25 | DRG: 945 | Disposition: A | Discharge status: Home Health | Payer: Medicare Other | Source: Intra-hospital | Attending: Physical Medicine & Rehabilitation | Admitting: Physical Medicine & Rehabilitation

## 2020-06-10 ENCOUNTER — Other Ambulatory Visit: Payer: Self-pay

## 2020-06-10 ENCOUNTER — Encounter (HOSPITAL_COMMUNITY): Payer: Self-pay | Admitting: Physical Medicine & Rehabilitation

## 2020-06-10 DIAGNOSIS — I4891 Unspecified atrial fibrillation: Secondary | ICD-10-CM | POA: Diagnosis present

## 2020-06-10 DIAGNOSIS — C641 Malignant neoplasm of right kidney, except renal pelvis: Secondary | ICD-10-CM | POA: Diagnosis present

## 2020-06-10 DIAGNOSIS — N2581 Secondary hyperparathyroidism of renal origin: Secondary | ICD-10-CM | POA: Diagnosis not present

## 2020-06-10 DIAGNOSIS — Z8249 Family history of ischemic heart disease and other diseases of the circulatory system: Secondary | ICD-10-CM | POA: Diagnosis not present

## 2020-06-10 DIAGNOSIS — D631 Anemia in chronic kidney disease: Secondary | ICD-10-CM | POA: Diagnosis not present

## 2020-06-10 DIAGNOSIS — Z20822 Contact with and (suspected) exposure to covid-19: Secondary | ICD-10-CM | POA: Diagnosis present

## 2020-06-10 DIAGNOSIS — E1165 Type 2 diabetes mellitus with hyperglycemia: Secondary | ICD-10-CM | POA: Diagnosis present

## 2020-06-10 DIAGNOSIS — I959 Hypotension, unspecified: Secondary | ICD-10-CM

## 2020-06-10 DIAGNOSIS — I10 Essential (primary) hypertension: Secondary | ICD-10-CM | POA: Diagnosis present

## 2020-06-10 DIAGNOSIS — D62 Acute posthemorrhagic anemia: Secondary | ICD-10-CM | POA: Diagnosis not present

## 2020-06-10 DIAGNOSIS — E1129 Type 2 diabetes mellitus with other diabetic kidney complication: Secondary | ICD-10-CM | POA: Diagnosis not present

## 2020-06-10 DIAGNOSIS — I120 Hypertensive chronic kidney disease with stage 5 chronic kidney disease or end stage renal disease: Secondary | ICD-10-CM | POA: Diagnosis present

## 2020-06-10 DIAGNOSIS — N179 Acute kidney failure, unspecified: Secondary | ICD-10-CM

## 2020-06-10 DIAGNOSIS — Z87891 Personal history of nicotine dependence: Secondary | ICD-10-CM | POA: Diagnosis not present

## 2020-06-10 DIAGNOSIS — Z6831 Body mass index (BMI) 31.0-31.9, adult: Secondary | ICD-10-CM

## 2020-06-10 DIAGNOSIS — E1122 Type 2 diabetes mellitus with diabetic chronic kidney disease: Secondary | ICD-10-CM | POA: Diagnosis not present

## 2020-06-10 DIAGNOSIS — Z833 Family history of diabetes mellitus: Secondary | ICD-10-CM | POA: Diagnosis not present

## 2020-06-10 DIAGNOSIS — I428 Other cardiomyopathies: Secondary | ICD-10-CM | POA: Diagnosis not present

## 2020-06-10 DIAGNOSIS — Z905 Acquired absence of kidney: Secondary | ICD-10-CM

## 2020-06-10 DIAGNOSIS — M109 Gout, unspecified: Secondary | ICD-10-CM | POA: Diagnosis present

## 2020-06-10 DIAGNOSIS — J45909 Unspecified asthma, uncomplicated: Secondary | ICD-10-CM | POA: Diagnosis present

## 2020-06-10 DIAGNOSIS — R140 Abdominal distension (gaseous): Secondary | ICD-10-CM

## 2020-06-10 DIAGNOSIS — I953 Hypotension of hemodialysis: Secondary | ICD-10-CM | POA: Diagnosis present

## 2020-06-10 DIAGNOSIS — R5381 Other malaise: Principal | ICD-10-CM

## 2020-06-10 DIAGNOSIS — I129 Hypertensive chronic kidney disease with stage 1 through stage 4 chronic kidney disease, or unspecified chronic kidney disease: Secondary | ICD-10-CM | POA: Diagnosis not present

## 2020-06-10 DIAGNOSIS — E669 Obesity, unspecified: Secondary | ICD-10-CM

## 2020-06-10 DIAGNOSIS — Z79899 Other long term (current) drug therapy: Secondary | ICD-10-CM | POA: Diagnosis not present

## 2020-06-10 DIAGNOSIS — I480 Paroxysmal atrial fibrillation: Secondary | ICD-10-CM

## 2020-06-10 DIAGNOSIS — N186 End stage renal disease: Secondary | ICD-10-CM

## 2020-06-10 DIAGNOSIS — E119 Type 2 diabetes mellitus without complications: Secondary | ICD-10-CM

## 2020-06-10 DIAGNOSIS — E46 Unspecified protein-calorie malnutrition: Secondary | ICD-10-CM | POA: Diagnosis not present

## 2020-06-10 DIAGNOSIS — Z992 Dependence on renal dialysis: Secondary | ICD-10-CM

## 2020-06-10 DIAGNOSIS — D638 Anemia in other chronic diseases classified elsewhere: Secondary | ICD-10-CM

## 2020-06-10 DIAGNOSIS — Z7982 Long term (current) use of aspirin: Secondary | ICD-10-CM | POA: Diagnosis not present

## 2020-06-10 DIAGNOSIS — C649 Malignant neoplasm of unspecified kidney, except renal pelvis: Secondary | ICD-10-CM | POA: Diagnosis present

## 2020-06-10 DIAGNOSIS — N2889 Other specified disorders of kidney and ureter: Secondary | ICD-10-CM | POA: Diagnosis not present

## 2020-06-10 DIAGNOSIS — Z7951 Long term (current) use of inhaled steroids: Secondary | ICD-10-CM

## 2020-06-10 DIAGNOSIS — N184 Chronic kidney disease, stage 4 (severe): Secondary | ICD-10-CM | POA: Diagnosis not present

## 2020-06-10 DIAGNOSIS — E1169 Type 2 diabetes mellitus with other specified complication: Secondary | ICD-10-CM

## 2020-06-10 DIAGNOSIS — L8990 Pressure ulcer of unspecified site, unspecified stage: Secondary | ICD-10-CM | POA: Insufficient documentation

## 2020-06-10 LAB — BASIC METABOLIC PANEL
Anion gap: 17 — ABNORMAL HIGH (ref 5–15)
BUN: 108 mg/dL — ABNORMAL HIGH (ref 8–23)
CO2: 21 mmol/L — ABNORMAL LOW (ref 22–32)
Calcium: 8.1 mg/dL — ABNORMAL LOW (ref 8.9–10.3)
Chloride: 97 mmol/L — ABNORMAL LOW (ref 98–111)
Creatinine, Ser: 11.56 mg/dL — ABNORMAL HIGH (ref 0.61–1.24)
GFR calc Af Amer: 4 mL/min — ABNORMAL LOW (ref >60)
GFR calc non Af Amer: 4 mL/min — ABNORMAL LOW (ref >60)
Glucose, Bld: 88 mg/dL (ref 70–99)
Potassium: 5.1 mmol/L (ref 3.5–5.1)
Sodium: 135 mmol/L (ref 135–145)

## 2020-06-10 LAB — CBC
HCT: 25.6 % — ABNORMAL LOW (ref 39.0–52.0)
Hemoglobin: 8 g/dL — ABNORMAL LOW (ref 13.0–17.0)
MCH: 29.2 pg (ref 26.0–34.0)
MCHC: 31.3 g/dL (ref 30.0–36.0)
MCV: 93.4 fL (ref 80.0–100.0)
Platelets: 283 10*3/uL (ref 150–400)
RBC: 2.74 MIL/uL — ABNORMAL LOW (ref 4.22–5.81)
RDW: 17.9 % — ABNORMAL HIGH (ref 11.5–15.5)
WBC: 7.8 10*3/uL (ref 4.0–10.5)
nRBC: 0.4 % — ABNORMAL HIGH (ref 0.0–0.2)

## 2020-06-10 LAB — GLUCOSE, CAPILLARY
Glucose-Capillary: 140 mg/dL — ABNORMAL HIGH (ref 70–99)
Glucose-Capillary: 84 mg/dL (ref 70–99)
Glucose-Capillary: 84 mg/dL (ref 70–99)
Glucose-Capillary: 61 mg/dL — ABNORMAL LOW (ref 70–99)
Glucose-Capillary: 61 mg/dL — ABNORMAL LOW (ref 70–99)
Glucose-Capillary: 62 mg/dL — ABNORMAL LOW (ref 70–99)
Glucose-Capillary: 62 mg/dL — ABNORMAL LOW (ref 70–99)
Glucose-Capillary: 75 mg/dL (ref 70–99)
Glucose-Capillary: 75 mg/dL (ref 70–99)
Glucose-Capillary: 117 mg/dL — ABNORMAL HIGH (ref 70–99)
Glucose-Capillary: 107 mg/dL — ABNORMAL HIGH (ref 70–99)

## 2020-06-10 LAB — MAGNESIUM: Magnesium: 2.4 mg/dL (ref 1.7–2.4)

## 2020-06-10 MED ORDER — PHENOL 1.4 % MT LIQD: 1.0000 | OROMUCOSAL | Status: DC | PRN

## 2020-06-10 MED ORDER — DARBEPOETIN ALFA 200 MCG/0.4ML IJ SOSY: 200.0000 ug | PREFILLED_SYRINGE | INTRAMUSCULAR | Status: DC

## 2020-06-10 MED ORDER — INSULIN DETEMIR 100 UNIT/ML ~~LOC~~ SOLN
14.0000 [IU] | Freq: Every day | SUBCUTANEOUS | Status: DC
  Administered 2020-06-10 – 2020-06-11 (×2): 14 [IU] via SUBCUTANEOUS
  Filled 2020-06-10 (×3): qty 0.14

## 2020-06-10 MED ORDER — DIPHENHYDRAMINE HCL 12.5 MG/5ML PO ELIX
12.5000 mg | ORAL_SOLUTION | Freq: Four times a day (QID) | ORAL | Status: DC | PRN
  Administered 2020-06-19 – 2020-06-25 (×14): 25 mg via ORAL
  Filled 2020-06-10 (×14): qty 10

## 2020-06-10 MED ORDER — BISACODYL 10 MG RE SUPP: 10.0000 mg | Freq: Every day | RECTAL | Status: DC | PRN

## 2020-06-10 MED ORDER — PROMETHAZINE HCL 25 MG/ML IJ SOLN
12.5000 mg | Freq: Four times a day (QID) | INTRAMUSCULAR | Status: DC | PRN
  Filled 2020-06-10: qty 1

## 2020-06-10 MED ORDER — ENSURE ENLIVE PO LIQD: 237.0000 mL | Freq: Two times a day (BID) | ORAL | Status: DC

## 2020-06-10 MED ORDER — MILK AND MOLASSES ENEMA: 1.0000 | Freq: Every day | RECTAL | Status: DC | PRN

## 2020-06-10 MED ORDER — RESOURCE THICKENUP CLEAR PO POWD: ORAL | Status: DC | PRN

## 2020-06-10 MED ORDER — CHLORHEXIDINE GLUCONATE CLOTH 2 % EX PADS
6.0000 | MEDICATED_PAD | Freq: Every day | CUTANEOUS | Status: DC
  Administered 2020-06-11 – 2020-06-13 (×3): 6 via TOPICAL

## 2020-06-10 MED ORDER — LIDOCAINE VISCOUS HCL 2 % MT SOLN
15.0000 mL | Freq: Four times a day (QID) | OROMUCOSAL | Status: DC | PRN
  Filled 2020-06-10: qty 15

## 2020-06-10 MED ORDER — BUDESONIDE 0.5 MG/2ML IN SUSP
0.5000 mg | Freq: Two times a day (BID) | RESPIRATORY_TRACT | Status: DC
  Administered 2020-06-11 – 2020-06-25 (×25): 0.5 mg via RESPIRATORY_TRACT
  Filled 2020-06-10 (×34): qty 2

## 2020-06-10 MED ORDER — ALUMINUM HYDROXIDE GEL 320 MG/5ML PO SUSP
10.0000 mL | Freq: Four times a day (QID) | ORAL | Status: DC | PRN
  Filled 2020-06-10: qty 30

## 2020-06-10 MED ORDER — METOPROLOL TARTRATE 12.5 MG HALF TABLET
12.5000 mg | ORAL_TABLET | Freq: Two times a day (BID) | ORAL | Status: DC
  Administered 2020-06-10 – 2020-06-25 (×19): 12.5 mg via ORAL
  Filled 2020-06-10 (×28): qty 1

## 2020-06-10 MED ORDER — ONDANSETRON HCL 4 MG/2ML IJ SOLN
4.0000 mg | Freq: Four times a day (QID) | INTRAMUSCULAR | Status: DC | PRN
  Filled 2020-06-10: qty 2

## 2020-06-10 MED ORDER — RENA-VITE PO TABS: 1.0000 | ORAL_TABLET | Freq: Every day | ORAL | Status: DC

## 2020-06-10 MED ORDER — MIDODRINE HCL 5 MG PO TABS
5.0000 mg | ORAL_TABLET | Freq: Three times a day (TID) | ORAL | Status: DC
  Administered 2020-06-11: 5 mg via ORAL
  Filled 2020-06-10 (×2): qty 1

## 2020-06-10 MED ORDER — ALBUMIN HUMAN 25 % IV SOLN
25.0000 g | Freq: Once | INTRAVENOUS | Status: DC
  Filled 2020-06-10: qty 100

## 2020-06-10 MED ORDER — ALBUMIN HUMAN 25 % IV SOLN: 25.0000 g | Freq: Once | INTRAVENOUS | Status: AC

## 2020-06-10 MED ORDER — LEVALBUTEROL HCL 0.63 MG/3ML IN NEBU
0.6300 mg | INHALATION_SOLUTION | Freq: Three times a day (TID) | RESPIRATORY_TRACT | Status: DC | PRN
  Administered 2020-06-13 – 2020-06-15 (×2): 0.63 mg via RESPIRATORY_TRACT
  Filled 2020-06-10 (×2): qty 3

## 2020-06-10 MED ORDER — INSULIN ASPART 100 UNIT/ML ~~LOC~~ SOLN: 0.0000 [IU] | Freq: Every day | SUBCUTANEOUS | Status: DC

## 2020-06-10 MED ORDER — ACETAMINOPHEN 325 MG PO TABS
325.0000 mg | ORAL_TABLET | ORAL | Status: DC | PRN
  Administered 2020-06-11: 650 mg via ORAL
  Administered 2020-06-20: 325 mg via ORAL
  Administered 2020-06-23 – 2020-06-25 (×5): 650 mg via ORAL
  Filled 2020-06-10 (×7): qty 2

## 2020-06-10 MED ORDER — POLYETHYLENE GLYCOL 3350 17 G PO PACK
17.0000 g | PACK | Freq: Every day | ORAL | Status: DC | PRN
  Filled 2020-06-10: qty 1

## 2020-06-10 MED ORDER — ALBUMIN HUMAN 25 % IV SOLN
INTRAVENOUS | Status: AC
  Filled 2020-06-10: qty 100

## 2020-06-10 MED ORDER — MELATONIN 3 MG PO TABS
3.0000 mg | ORAL_TABLET | Freq: Every day | ORAL | Status: DC | PRN
  Administered 2020-06-11 – 2020-06-22 (×4): 3 mg via ORAL
  Filled 2020-06-10 (×4): qty 1

## 2020-06-10 MED ORDER — HYDROCORTISONE 10 MG PO TABS
10.0000 mg | ORAL_TABLET | Freq: Two times a day (BID) | ORAL | Status: DC
  Administered 2020-06-11 (×2): 10 mg via ORAL
  Filled 2020-06-10 (×3): qty 1

## 2020-06-10 MED ORDER — RENA-VITE PO TABS
1.0000 | ORAL_TABLET | Freq: Every day | ORAL | Status: DC
  Administered 2020-06-10 – 2020-06-24 (×15): 1 via ORAL
  Filled 2020-06-10 (×15): qty 1

## 2020-06-10 MED ORDER — MENTHOL 3 MG MT LOZG
1.0000 | LOZENGE | OROMUCOSAL | Status: DC | PRN
  Administered 2020-06-13 (×2): 3 mg via ORAL
  Filled 2020-06-10 (×2): qty 9

## 2020-06-10 MED ORDER — INSULIN ASPART 100 UNIT/ML ~~LOC~~ SOLN
0.0000 [IU] | Freq: Three times a day (TID) | SUBCUTANEOUS | Status: DC
  Administered 2020-06-22 – 2020-06-25 (×3): 1 [IU] via SUBCUTANEOUS

## 2020-06-10 MED ORDER — GUAIFENESIN-DM 100-10 MG/5ML PO SYRP: 5.0000 mL | ORAL_SOLUTION | Freq: Four times a day (QID) | ORAL | Status: DC | PRN

## 2020-06-10 MED ORDER — HEPARIN SODIUM (PORCINE) 1000 UNIT/ML IJ SOLN
INTRAMUSCULAR | Status: AC
  Administered 2020-06-10: 3800 [IU] via INTRAVENOUS_CENTRAL
  Filled 2020-06-10: qty 4

## 2020-06-10 MED ORDER — ORAL CARE MOUTH RINSE
15.0000 mL | Freq: Two times a day (BID) | OROMUCOSAL | Status: DC
  Administered 2020-06-11 – 2020-06-24 (×8): 15 mL via OROMUCOSAL

## 2020-06-10 MED ORDER — ARFORMOTEROL TARTRATE 15 MCG/2ML IN NEBU
15.0000 ug | INHALATION_SOLUTION | Freq: Two times a day (BID) | RESPIRATORY_TRACT | Status: DC
  Administered 2020-06-11 – 2020-06-25 (×26): 15 ug via RESPIRATORY_TRACT
  Filled 2020-06-10 (×35): qty 2

## 2020-06-10 MED ORDER — HEPARIN SODIUM (PORCINE) 1000 UNIT/ML DIALYSIS
1000.0000 [IU] | INTRAMUSCULAR | Status: DC | PRN
  Filled 2020-06-10 (×4): qty 1

## 2020-06-10 NOTE — H&P (Signed)
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 He was admitted on 05/16/20 for right nephrectomy with lysis of adhesis by Dr. Alyson Ingles. Post op course significant for hypotension with acute on chronic renal failure as well as ABLA due to large volume hemorrhage into right nephrectomy bed. He developed volume overload as well as acute metabolic encephalopathy due to uremia requiring CRRT. He developed septic shock requiring pressors and acute respiratory failure requiring with reintubation 07/13- 07/17. He was treated with broad spectrum antibiotics for HCAP and was weaned off pressors .  He has recurrent drop in H/H on 07/20 requiring one unit PRBC as well as vasopressors. He was started on midodrine. Repeat CT abdomen on 07/21 showed hematoma resolving.    Hospital course significant for persistent hypotension --required CRRT and stress dose steroids, episode of NSVT,  recurrent N/V concerning for ileus, blood output from NGT, persistent leucocytosis with recurrent fevers. BLE dopplers were negative for DVT.  He defervesced without antibiotics and Right IJ tunneled catheter and left forearm graft placed on 07/31 by Dr. Donnetta Hutching.  He developed A fib with RVR on 07/31 and was started on low dose BB.  Cardiology consulted on 08/02 and felt that patient at high risk for [facility] and heart rate controlled but BP remain soft and may need to hold BB on dialysis days. Stress dose steroids being weaned off and midodrine decreased to 5 mg tid today. Therapy ongoing and CIR recommended due to debility.    Review of Systems  Constitutional: Negative for fever.  HENT: Negative for congestion and ear pain.   Eyes: Negative for double vision.  Respiratory: Positive for cough.   Cardiovascular: Negative for chest pain.  Gastrointestinal: Negative  for nausea and vomiting.  Musculoskeletal: Negative for myalgias and neck pain.  Skin: Negative for rash.  Neurological: Positive for weakness. Negative for dizziness.  Psychiatric/Behavioral: Negative for depression.      Past Medical History:  Diagnosis Date  . Arthritis   . Cancer [facility])    prostate  . Cardiomyopathy    secondary  . CKD (chronic kidney disease)   . DM2 (diabetes mellitus, type 2) ([place])   . Gout   . HTN (hypertension)    unspec  . Hypercholesterolemia   . Nonischemic cardiomyopathy ([place])    a. EF 40-50% by most recent 2-D echo b. EF 25% by cardiac cath in 2004  . Overweight(278.02)     Past Surgical History:  Procedure Laterality Date  . AV FISTULA PLACEMENT Left 06/06/2020   Procedure: LEFT ARM ARTERIOVENOUS (AV) GRAFT USING 45CM GORTEX;  Surgeon: [name], MD;  Location: [facility];  Service: Vascular;  Laterality: Left;  . COLONOSCOPY    . HERNIA REPAIR    . INSERTION OF DIALYSIS CATHETER Right 06/06/2020   Procedure: INSERTION OF DIALYSIS CATHETER USING 23CM DOUBLE LUMEN CATHETER;  Surgeon: [name], MD;  Location: [place];  Service: Vascular;  Laterality: Right;  . IR FLUORO GUIDE CV LINE RIGHT  05/20/2020  . IR US GUIDE VASC ACCESS RIGHT  05/20/2020  . KNEE ARTHROPLASTY Left 08/08/2017   Procedure: LEFT TOTAL KNEE ARTHROPLASTY WITH COMPUTER NAVIGATION;  Surgeon: Rod Can, MD;  Location: [facility];  Service: Orthopedics;  Laterality: Left;  Needs RNFA  . KNEE ARTHROPLASTY Right 11/17/2017   Procedure:  RIGHT TOTAL KNEE ARTHROPLASTY WITH COMPUTER NAVIGATION;  Surgeon: Rod Can, MD;  Location: [facility];  Service: Orthopedics;  Laterality: Right;  NEEDS RNFA  . ROBOT ASSISTED LAPAROSCOPIC NEPHRECTOMY Right 05/16/2020   Procedure: XI ROBOTIC ASSISTED LAPAROSCOPIC NEPHRECTOMY;  Surgeon: Cleon Gustin, MD;  Location: [facility];  Service: Urology;  Laterality: Right;  2.5 hrs    Family History  Problem Relation Age of Onset  . Hypertension Mother     . Kidney disease Mother   . Heart disease Father   . Kidney disease Other     Social History: Married. 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 Active--works out 45 minutes/5 days week at home.  He reports that he quit smoking about 35 years ago. His smoking use included cigarettes. He started smoking about 54 years ago. He has a 10.00 pack-year smoking history. He has never used smokeless tobacco. He reports that he does not drink alcohol and does not use drugs.    Allergies: No Known Allergies    Medications Prior to Admission  Medication Sig Dispense Refill  . acetaminophen (TYLENOL) 500 MG tablet Take 1,000 mg by mouth every 6 (six) hours as needed for moderate pain or headache.    . albuterol (VENTOLIN HFA) 108 (90 Base) MCG/ACT inhaler Inhale 1-2 puffs into the lungs every 6 (six) hours as needed for wheezing or shortness of breath.     . allopurinol (ZYLOPRIM) 300 MG tablet Take 300 mg by mouth every morning.    Marland Kitchen aspirin EC 81 MG tablet Take 81 mg by mouth daily.    Marland Kitchen atorvastatin (LIPITOR) 40 MG tablet Take 1 tablet (40 mg total) by mouth daily. (Patient taking differently: Take 40 mg by mouth every evening. ) 90 tablet 3  . benazepril (LOTENSIN) 20 MG tablet Take 20 mg by mouth every morning.     . budesonide-formoterol (SYMBICORT) 160-4.5 MCG/ACT inhaler Inhale 2 puffs into the lungs 2 (two) times daily.    . bumetanide (BUMEX) 1 MG tablet Take 1 mg by mouth See admin instructions. Take 1 mg daily, may take a second 1 mg dose as needed for swelling    . carvedilol (COREG) 25 MG tablet Take 1 tablet (25 mg total) by mouth 2 (two) times daily. 180 tablet 3  . Cholecalciferol (VITAMIN D) 50 MCG (2000 UT) tablet Take 2,000 Units by mouth daily.    Marland Kitchen loratadine (CLARITIN) 10 MG tablet Take 10 mg by mouth daily.    . Multiple Vitamin (MULTIVITAMIN) tablet Take 1 tablet by mouth daily.    . Tetrahydrozoline HCl (VISINE OP) Place 1 drop into both eyes daily as needed (dryness).    Marland Kitchen  diclofenac Sodium (VOLTAREN) 1 % GEL Apply 1 application topically 4 (four) times daily as needed (pain).    Marland Kitchen senna (SENOKOT) 8.6 MG tablet Take 1 tablet by mouth daily as needed for constipation.     Marland Kitchen tiZANidine (ZANAFLEX) 4 MG tablet Take 4 mg by mouth every 8 (eight) hours as needed for muscle spasms.       Drug Regimen Review  Drug regimen was reviewed and remains appropriate with no significant issues identified  Home: Home Living Family/patient expects to be discharged to:: Private residence Living Arrangements: Spouse/significant other Available Help at Discharge: Family, Available 24 hours/day Type of Home: House Home Access: Stairs to enter [employer] of Steps: 1 Home Layout: One level Bathroom Shower/Tub: Tub/shower unit, Architectural technologist: Handicapped height Bathroom Accessibility: Yes (to RW, not w/c) Home  Equipment: Gilford Rile - 2 wheels, Bedside commode   Functional History: Prior Function Level of Independence: Independent Comments: With ADL's and iADL's  Functional Status:  Mobility: Bed Mobility Overal bed mobility: Needs Assistance Bed Mobility: Supine to Sit Rolling: Min assist Sidelying to sit: Mod assist, +2 for safety/equipment, HOB elevated Supine to sit: Min assist, HOB elevated Sit to supine: Min guard General bed mobility comments: Assist to elevate trunk into sitting Transfers Overall transfer level: Needs assistance Equipment used: Rolling walker (2 wheeled) Transfers: Sit to/from Stand, [employer] Transfers Sit to Stand: From elevated surface, Min assist Stand pivot transfers: Min assist  Lateral/Scoot Transfers: +2 physical assistance, Mod assist General transfer comment: Assist to bring hips up and for balance. Verbal cues for hand placement. Ambulation/Gait Ambulation/Gait assistance: Min assist Gait Distance (Feet): 45 Feet (15' x 1, 30' x 1, 45' x 1) Assistive device: Rolling walker (2 wheeled) Gait  Pattern/deviations: Step-through pattern, Decreased stride length, Trunk flexed General Gait Details: Assist for balance and support. Pt fatigued quickly. Brought rollator behind pt for pt to sit for frequent rest breaks. Gait velocity: decr Gait velocity interpretation: <1.8 ft/sec, indicate of risk for recurrent falls    ADL: ADL Overall ADL's : Needs assistance/impaired Eating/Feeding: NPO Grooming: Wash/dry hands, Set up, Sitting Grooming Details (indicate cue type and reason): for support at elbow  Upper Body Bathing: Minimal assistance, Sitting, Bed level Upper Body Bathing Details (indicate cue type and reason): minA for support at elbow for shoulder ROM to access armpits for washing and donning deodorant Lower Body Bathing: Total assistance Lower Body Bathing Details (indicate cue type and reason): peri care during session due to void.  Upper Body Dressing : Maximal assistance, Sitting Lower Body Dressing: Maximal assistance Lower Body Dressing Details (indicate cue type and reason): modA+2 to stand  Toilet Transfer: Minimal assistance, +2 for safety/equipment, Ambulation, RW, Grab bars, Regular Toilet Toilet Transfer Details (indicate cue type and reason): mod-max A for sit <> stand. Pt limited by dizziness and nausea with positional changes Toileting- Clothing Manipulation and Hygiene: Maximal assistance, +2 for safety/equipment, Sit to/from stand Toileting - Clothing Manipulation Details (indicate cue type and reason): assist for pericare in standing after BM Functional mobility during ADLs: Minimal assistance, +2 for safety/equipment, Rolling walker General ADL Comments: pt on bed pan that patient reports is extended time but had not called RN staff to help off pan. Only when therapist asked did patient indicate need to get off pan. Pt noted to have diarrhea void at this time with light brown color. Pt with skin break down at sacrum  Cognition: Cognition Overall Cognitive  Status: Within Functional Limits for tasks assessed Orientation Level: Oriented X4 Cognition Arousal/Alertness: Awake/alert Behavior During Therapy: WFL for tasks assessed/performed, Flat affect Overall Cognitive Status: Within Functional Limits for tasks assessed General Comments: requires encouragement to engage with therapist as he is initially very limited in verbalizations, appears [facility] for basic tasks today    Blood pressure 92/62, pulse 97, temperature 99.1 F (37.3 C), temperature source Oral, resp. rate (!) 22, height 6\' 2"  (1.88 m), weight 111.1 kg, SpO2 99 %. Physical Exam Vitals and nursing note reviewed.  Constitutional:      Appearance: Normal appearance.  HENT:     Head: Normocephalic.     Nose: Nose normal.  Eyes:     Pupils: Pupils are equal, round, and reactive to light.  Cardiovascular:     Rate and Rhythm: Tachycardia present.     Comments: HR in  105-110 at rest Pulmonary:     Effort: Pulmonary effort is normal.  Abdominal:     General: Bowel sounds are normal. There is no distension.     Tenderness: There is no abdominal tenderness.     Comments: RLQ distended--resolving hematoma under incision? Incisions C/D/I with skin glue in place.   Musculoskeletal:        General: Swelling (trace LUE) present.     Cervical back: Normal range of motion.     Comments: Well bilateral TKR incisions.   Skin:    General: Skin is warm.  Neurological:     Mental Status: He is alert and oriented to person, place, and time.     Cranial Nerves: No cranial nerve deficit.     Sensory: No sensory deficit.     Motor: Weakness present.  Psychiatric:     Comments: flat     Results for orders placed or performed during the hospital encounter of 05/16/20 (from the past 48 hour(s))  Glucose, capillary     Status: Abnormal   Collection Time: 06/08/20  8:27 PM  Result Value Ref Range   Glucose-Capillary 160 (H) 70 - 99 mg/dL    Comment: Glucose reference range applies only to  samples taken after fasting for at least 8 hours.  Glucose, capillary     Status: Abnormal   Collection Time: 06/09/20 12:31 AM  Result Value Ref Range   Glucose-Capillary 150 (H) 70 - 99 mg/dL    Comment: Glucose reference range applies only to samples taken after fasting for at least 8 hours.  CBC     Status: Abnormal   Collection Time: 06/09/20  1:23 AM  Result Value Ref Range   WBC 9.7 4.0 - 10.5 K/uL   RBC 2.66 (L) 4.22 - 5.81 MIL/uL   Hemoglobin 7.9 (L) 13.0 - 17.0 g/dL   HCT 24.9 (L) 39 - 52 %   MCV 93.6 80.0 - 100.0 fL   MCH 29.7 26.0 - 34.0 pg   MCHC 31.7 30.0 - 36.0 g/dL   RDW 18.4 (H) 11.5 - 15.5 %   Platelets 321 150 - 400 K/uL   nRBC 0.2 0.0 - 0.2 %    Comment: Performed at [facility] [address] [place], [place] [address]  Basic metabolic panel     Status: Abnormal   Collection Time: 06/09/20  1:23 AM  Result Value Ref Range   Sodium 134 (L) 135 - 145 mmol/L   Potassium 5.0 3.5 - 5.1 mmol/L   Chloride 97 (L) 98 - 111 mmol/L   CO2 22 22 - 32 mmol/L   Glucose, Bld 159 (H) 70 - 99 mg/dL    Comment: Glucose reference range applies only to samples taken after fasting for at least 8 hours.   BUN 86 (H) 8 - 23 mg/dL   Creatinine, Ser 9.68 (H) 0.61 - 1.24 mg/dL   Calcium 8.2 (L) 8.9 - 10.3 mg/dL   GFR calc non Af Amer 5 (L) >60 mL/min   GFR calc Af Amer 6 (L) >60 mL/min   Anion gap 15 5 - 15    Comment: Performed at [facility] [address] [place], [place] [address]  Magnesium     Status: None   Collection Time: 06/09/20  1:23 AM  Result Value Ref Range   Magnesium 2.3 1.7 - 2.4 mg/dL    Comment: Performed at [facility] [address], [place], [place] [address]  Glucose, capillary     Status: Abnormal   Collection Time: 06/09/20  4:43 AM  Result Value Ref Range   Glucose-Capillary 144 (H) 70 - 99 mg/dL    Comment: Glucose reference range applies only to samples taken after fasting for at least 8 hours.  Glucose, capillary      Status: Abnormal   Collection Time: 06/09/20  7:57 AM  Result Value Ref Range   Glucose-Capillary 109 (H) 70 - 99 mg/dL    Comment: Glucose reference range applies only to samples taken after fasting for at least 8 hours.   Comment 1 Notify RN    Comment 2 Document in Chart   Glucose, capillary     Status: Abnormal   Collection Time: 06/09/20 12:02 PM  Result Value Ref Range   Glucose-Capillary 148 (H) 70 - 99 mg/dL    Comment: Glucose reference range applies only to samples taken after fasting for at least 8 hours.   Comment 1 Notify RN    Comment 2 Document in Chart   Glucose, capillary     Status: Abnormal   Collection Time: 06/09/20  4:20 PM  Result Value Ref Range   Glucose-Capillary 148 (H) 70 - 99 mg/dL    Comment: Glucose reference range applies only to samples taken after fasting for at least 8 hours.   Comment 1 Notify RN    Comment 2 Document in Chart   Glucose, capillary     Status: Abnormal   Collection Time: 06/09/20  7:58 PM  Result Value Ref Range   Glucose-Capillary 135 (H) 70 - 99 mg/dL    Comment: Glucose reference range applies only to samples taken after fasting for at least 8 hours.  Glucose, capillary     Status: Abnormal   Collection Time: 06/09/20 11:36 PM  Result Value Ref Range   Glucose-Capillary 118 (H) 70 - 99 mg/dL    Comment: Glucose reference range applies only to samples taken after fasting for at least 8 hours.  Basic metabolic panel     Status: Abnormal   Collection Time: 06/10/20  3:07 AM  Result Value Ref Range   Sodium 135 135 - 145 mmol/L   Potassium 5.1 3.5 - 5.1 mmol/L   Chloride 97 (L) 98 - 111 mmol/L   CO2 21 (L) 22 - 32 mmol/L   Glucose, Bld 88 70 - 99 mg/dL    Comment: Glucose reference range applies only to samples taken after fasting for at least 8 hours.   BUN 108 (H) 8 - 23 mg/dL   Creatinine, Ser 11.56 (H) 0.61 - 1.24 mg/dL   Calcium 8.1 (L) 8.9 - 10.3 mg/dL   GFR calc non Af Amer 4 (L) >60 mL/min   GFR calc Af Amer 4 (L)  >60 mL/min   Anion gap 17 (H) 5 - 15    Comment: Performed at [facility] [address], [place], [place] [address]  CBC     Status: Abnormal   Collection Time: 06/10/20  3:07 AM  Result Value Ref Range   WBC 7.8 4.0 - 10.5 K/uL   RBC 2.74 (L) 4.22 - 5.81 MIL/uL   Hemoglobin 8.0 (L) 13.0 - 17.0 g/dL   HCT 25.6 (L) 39 - 52 %   MCV 93.4 80.0 - 100.0 fL   MCH 29.2 26.0 - 34.0 pg   MCHC 31.3 30.0 - 36.0 g/dL   RDW 17.9 (H) 11.5 - 15.5 %   Platelets 283 150 - 400  K/uL   nRBC 0.4 (H) 0.0 - 0.2 %    Comment: Performed at [facility], [place] [address] [place], [place] [address]  Magnesium     Status: None   Collection Time: 06/10/20  3:07 AM  Result Value Ref Range   Magnesium 2.4 1.7 - 2.4 mg/dL    Comment: Performed at [facility] [address] [place], [place] [address]  Glucose, capillary     Status: None   Collection Time: 06/10/20  3:57 AM  Result Value Ref Range   Glucose-Capillary 84 70 - 99 mg/dL    Comment: Glucose reference range applies only to samples taken after fasting for at least 8 hours.  Glucose, capillary     Status: None   Collection Time: 06/10/20  3:57 AM  Result Value Ref Range   Glucose-Capillary 84 70 - 99 mg/dL    Comment: Glucose reference range applies only to samples taken after fasting for at least 8 hours.  Glucose, capillary     Status: Abnormal   Collection Time: 06/10/20  8:07 AM  Result Value Ref Range   Glucose-Capillary 61 (L) 70 - 99 mg/dL    Comment: Glucose reference range applies only to samples taken after fasting for at least 8 hours.  Glucose, capillary     Status: Abnormal   Collection Time: 06/10/20  8:07 AM  Result Value Ref Range   Glucose-Capillary 61 (L) 70 - 99 mg/dL    Comment: Glucose reference range applies only to samples taken after fasting for at least 8 hours.  Glucose, capillary     Status: Abnormal   Collection Time: 06/10/20  8:20 AM  Result Value Ref Range   Glucose-Capillary 62 (L) 70 - 99  mg/dL    Comment: Glucose reference range applies only to samples taken after fasting for at least 8 hours.  Glucose, capillary     Status: Abnormal   Collection Time: 06/10/20  8:20 AM  Result Value Ref Range   Glucose-Capillary 62 (L) 70 - 99 mg/dL    Comment: Glucose reference range applies only to samples taken after fasting for at least 8 hours.  Glucose, capillary     Status: None   Collection Time: 06/10/20  8:33 AM  Result Value Ref Range   Glucose-Capillary 75 70 - 99 mg/dL    Comment: Glucose reference range applies only to samples taken after fasting for at least 8 hours.  Glucose, capillary     Status: None   Collection Time: 06/10/20  8:33 AM  Result Value Ref Range   Glucose-Capillary 75 70 - 99 mg/dL    Comment: Glucose reference range applies only to samples taken after fasting for at least 8 hours.  Glucose, capillary     Status: Abnormal   Collection Time: 06/10/20 10:46 AM  Result Value Ref Range   Glucose-Capillary 117 (H) 70 - 99 mg/dL    Comment: Glucose reference range applies only to samples taken after fasting for at least 8 hours.   No results found.     Medical Problem List and Plan: 1.  Functional and mobility deficits secondary to debility after right nephrectomy and multiple post-op medical complications  -patient may   shower  -ELOS/Goals: 5-7 days, mod I 2.  Antithrombotics: -DVT/anticoagulation:  Mechanical: Sequential compression devices, below knee Bilateral lower extremities  -antiplatelet therapy: N/A 3. Pain Management: Tylenol prn   4. Mood: LCSW to follow for evaluation and support.   -antipsychotic agents:  N/A 5. Neuropsych: This patient is capable of making decisions on his own behalf. 6. Skin/Wound Care: Monitor wound for healing. Protein supplements to promote healing.  7. Fluids/Electrolytes/Nutrition: Change to Carb modified/renal diet--not on fluid restriction at this time.  Monitor strict I/O as well as daily weights.  9.  Persistent Hypotension: SBP 80-90 range--patient asymptomatic. On midodrine (decreased to 5 mg tid on 08/03)  as well as steroids (decreased to 10 mg bid on 08/02)--recommendations to wean as tolerated.   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 HD TTS--will schedule at the end of the day to help with tolerance of therapy.  11. ABLA:Serial labs with HD. On areanesp weekly and s/p iron repletion.  12. New onset A fib with RVR: Monitor HR tid--on low dose metoprolol bid.  13. H/o Asthma: Continue pulmicort, brovana and Xopenex nebs.   14. T2DM diet controlled: Hgb A1C- 6.2 and controlled. Will monitor BS ac/hs and use SSI for elevated BS--likely due to stress and steroids.  Will continue Lantus for now and wean as able.        Bary Leriche, PA-C 06/10/2020

## 2020-06-10 NOTE — Progress Notes (Signed)
Nutrition Follow-up  DOCUMENTATION CODES:   Non-severe (moderate) malnutrition in context of acute illness/injury  INTERVENTION:    Ensure Enlive po BID, each supplement provides 350 kcal and 20 grams of protein  Renal MVI daily  NUTRITION DIAGNOSIS:   Moderate Malnutrition related to acute illness (renal faiure requiring CRRT) as evidenced by mild muscle depletion, energy intake < or equal to 50% for > or equal to 5 days.  Ongoing  GOAL:   Patient will meet greater than or equal to 90% of their needs  Progressing  MONITOR:   PO intake, Labs, Weight trends, TF tolerance, Skin, I & O's  REASON FOR ASSESSMENT:   Consult Enteral/tube feeding initiation and management  ASSESSMENT:   74 yo male admitted 7/9 S/P right nephrectomy for large mass. Transferred to the ICU and required intubation on 7/13 due to shock and worsening respiratory failure. PMH includes HTN, cardiomyopathy, DM2, prostate CA, CKD, HLD, gout.  7/14 - CRRT initiated, TF initiated 7/17 - extubated 7/18 - CRRT d/c 7/19 - Cortrak placed, tip gastric per Cortrak team 7/20 - CRRT restarted, s/p FEES and diet advanced to dysphagia 2 with nectar-thick liquids 7/22 - diet advanced to dysphagia 3 with thin liquids 7/23 - NG tube placed for decompression, Cortrak advanced post-pyloric  Cortrak pulled 8/1 per MD. Intake progressing daily per family. Meal completions charted as 10-100% (67% average). Discussed the importance of protein intake for preservation of lean body mass. Willing to try Ensure.   Plan d/c CIR.   Admission weight: 132 kg  Current weight: 109.5 kg   Drips: albumin Medications: aranesp, SS novolog, levemir, rena-vit Labs: CBG 61-135  Diet Order:   Diet Order            Diet heart healthy/carb modified Room service appropriate? Yes; Fluid consistency: Thin  Diet effective now                 EDUCATION NEEDS:   Not appropriate for education at this time  Skin:  Skin  Assessment: Skin Integrity Issues: Incisions: closed abdomen, neck, back Other: MASD: coccyx, buttocks   Last BM:  7/30  Height:   Ht Readings from Last 1 Encounters:  06/06/20 6\' 2"  (1.88 m)    Weight:   Wt Readings from Last 1 Encounters:  06/10/20 109.5 kg    Ideal Body Weight:  86.4 kg  BMI:  Body mass index is 30.99 kg/m.  Estimated Nutritional Needs:   Kcal:  2350-2550  Protein:  120-140 grams  Fluid:  1 L + UOP  Jaquelin Meaney RD, LDN Clinical Nutrition Pager listed in [place]

## 2020-06-10 NOTE — Progress Notes (Signed)
PROGRESS NOTE  Todd Robertson ZDG:[id number] DOB: [birth year] DOA: 05/16/2020 PCP: Todd Blitz, MD   LOS: 25 days   Brief narrative: As per HPI,  Todd Robertson is a 74 years old male with a history of right renal mass, prostate cancer, osteoarthritis, cardiomyopathy, CKD, diabetes mellitus type 2, gout, hypertension, asthma presented to hospital for elective nephrectomy. 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  He underwent right nephrectomy on 05/16/2020 with retroperitoneal hematoma.  Creatinine on presentation was 2.6.  Postoperatively, patient developed hypotension and patient was transferred to the ICU.  Patient received vasopressors for hypotension.  Patient was subsequently considered medically stable for transfer out of ICU.  Hospitalist team was consulted for medical management.  Assessment/Plan:  Active Problems:   Cancer of kidney ([place])   S/p nephrectomy   Malnutrition of moderate degree  Hypotension Improved.  On midodrine and hydrocortisone, decreased dose. Continue to taper steroids and midodrine if possible. Metoprolol has been initiated for atrial fibrillation..  Patient was on Coreg at home.  Need to closely monitor blood pressure.  New onset atrial fibrillation with RVR. On 7/31. Controlled.  On low-dose metoprolol. 2D echocardiogram on 05/28/2020 showed LV ejection fraction of 45 to 50% with global hypokinesis. Grade 2 diastolic dysfunction.  Patient follows up with cardiology in [place].  Cardiology on board for further recommendations.  Patient will qualify for anticoagulation but he does have elevated HASBLED score.  Risk versus benefits to be ascertained prior to starting anticoagulation.  Hx of asthma. Continue pulmicort, brovana,  Xopenex  ESRD. - off CRRT 7/26.  Patient underwent hemodialysis catheter placement and fistula placement for hemodialysis on 06/06/2020.  Will undergo hemodialysis today.  S/p Rt nephrectomy for renal cell  carcinoma. Management as per primary team.  Anemia postoperative from blood loss plus anemia of chronic disease, retroperitoneal hematoma. Ttransfuse for Hb < 7 or significant bleeding. Status post PRBC I unit on 7/28.  Hemoglobin  of  8.0 today.  Trend has been 8.0-7.9-7.6-8.4- 7.7- 7.2<-6.8.   Low grade fever.  Improved leukocytosis has normalized.. No further spikes.  DM type 2 with hyperglycemia  Continue sliding scale insulin, Levemir, Accu-Cheks diabetic diet.  Adjust insulin depending upon steroid doses.  Chronic diastolic CHF, HLD. Continue to monitor. Compensated.  Deconditioning - PT/OT has recommended CIR placement.  Dysphagia. - speech therapy recommending regular diet with thin liquids from assessment on 7/26.  Off cortrak tube.  Moderate malnutrition.  Courage oral nutrition.  Off feeding tube.  Disposition plan.  CIR, likely today.  Will need ongoing discussion regarding anticoagulation with cardiology and cardiology follow-up for atrial fibrillation.  Overall, medically stable for CIR.   DVT prophylaxis: SCDs Start: 05/16/20 1601   Code Status: Full code  Family Communication:.  None   Consultants: Nephrology Urology PCCM Vascular surgery Cardiology  Procedures: 7/09 Rt nephrectomy 7/13 Rt IJ HD catheter by IR; worsening hypercapnia and AMS >> intubated,  7/14 start CRRT Rt IJ HD catheter 7/13 >> ETT 7/13 >> 7/17 Rt radial a line 7/15 > 7/18 Hemodialysis. Internal jugular hemodialysis catheter placement, left AV graft placement.  On 06/06/2020  Antibiotics:  Cefepime 7/13 >> 7/20 Vancomycin 7/13 >> 7/17  Subjective: Today, patient was seen and examined at bedside.  Has throat discomfort but no chest pain, shortness of breath, fever dizziness.   objective: Vitals:   06/10/20 1138 06/10/20 1139  BP: (!) 93/52 (!) 92/55  Pulse: 69 76  Resp: (!) 33 (!) 32  Temp:    SpO2:  No intake or output data in the 24 hours ending 06/10/20  1259 Filed Weights   06/07/20 1145 06/08/20 0619 06/10/20 0359  Weight: (!) 111.8 kg (!) 115 kg 109.5 kg   Body mass index is 30.99 kg/m.   Physical Exam:  GENERAL: Alert awake oriented and communicative.  Not in obvious distress.    Obese HENT: No scleral pallor or icterus. Pupils equally reactive to light. Oral mucosa is moist NECK: is supple, no gross swelling noted.  Right internal jugular hemodialysis catheter in place. CHEST:   Diminished breath sounds bilaterally.   CVS: S1 and S2 heard, no murmur.  Irregular rhythm.  ABDOMEN: Soft, non-tender, bowel sounds are present.  Obese abdomen. EXTREMITIES: Trace bilateral lower extremity edema.  Left forearm AV graft. CNS: Cranial nerves are intact. No focal motor deficits. SKIN: warm and dry without rashes.  Data Review: I have personally reviewed the following laboratory data and studies,  CBC: Recent Labs  Lab 06/06/20 0418 06/07/20 0200 06/08/20 0241 06/09/20 0123 06/10/20 0307  WBC 13.4* 12.3* 10.4 9.7 7.8  HGB 7.7* 8.4* 7.6* 7.9* 8.0*  HCT 24.0* 25.8* 24.6* 24.9* 25.6*  MCV 94.1 94.2 93.5 93.6 93.4  PLT 300 325 277 321 644   Basic Metabolic Panel: Recent Labs  Lab 06/04/20 0434 06/04/20 0434 06/05/20 0529 06/05/20 0529 06/06/20 0418 06/07/20 0200 06/08/20 0241 06/09/20 0123 06/10/20 0307  NA 134*   < > 133*   < > 133* 132* 135 134* 135  K 4.4   < > 4.0   < > 4.8 4.5 4.4 5.0 5.1  CL 97*   < > 96*   < > 96* 94* 98 97* 97*  CO2 21*   < > 24   < > 21* 19* 23 22 21*  GLUCOSE 349*   < > 326*   < > 344* 335* 108* 159* 88  BUN 89*   < > 66*   < > 100* 122* 60* 86* 108*  CREATININE 9.32*   < > 7.25*   < > 8.92* 10.91* 7.13* 9.68* 11.56*  CALCIUM 8.2*   < > 8.2*   < > 8.4* 8.5* 8.3* 8.2* 8.1*  MG  --   --  2.3  --   --   --  2.1 2.3 2.4  PHOS 4.1  --   --   --   --   --   --   --   --    < > = values in this interval not displayed.   Liver Function Tests: Recent Labs  Lab 06/04/20 0434 06/05/20 0529  AST   --  19  ALT  --  18  ALKPHOS  --  66  BILITOT  --  0.7  PROT  --  5.1*  ALBUMIN 2.4* 2.2*   No results for input(s): LIPASE, AMYLASE in the last 168 hours. No results for input(s): AMMONIA in the last 168 hours. Cardiac Enzymes: No results for input(s): CKTOTAL, CKMB, CKMBINDEX, TROPONINI in the last 168 hours. BNP (last 3 results) No results for input(s): BNP in the last 8760 hours.  ProBNP (last 3 results) No results for input(s): PROBNP in the last 8760 hours.  CBG: Recent Labs  Lab 06/10/20 0357 06/10/20 0807 06/10/20 0820 06/10/20 0833 06/10/20 1046  GLUCAP 84  84 61*  61* 62*  62* 75  75 117*   No results found for this or any previous visit (from the past 240 hour(s)).   Studies: No results  found.    Flora Lipps, MD  [facility] 06/10/2020

## 2020-06-10 NOTE — Progress Notes (Signed)
Subjective:  BP soft in the 90s still -  No complaints Objective Vital signs in last 24 hours: Vitals:   06/09/20 2000 06/09/20 2337 06/10/20 0358 06/10/20 0359  BP: 99/69 (!) 86/55 (!) 93/56   Pulse: 87 90 83   Resp: 20 16 19    Temp: 98.5 F (36.9 C) 98.8 F (37.1 C) 98.9 F (37.2 C)   TempSrc: Oral Oral Oral   SpO2: 93% 95% 97%   Weight:    109.5 kg  Height:       Weight change:  No intake or output data in the 24 hours ending 06/10/20 0730  Assessment/ Plan: Pt is a 74 y.o. yo male with advanced CKD at baseline who was admitted on 05/16/2020 with need for nephrectomy  Assessment/Plan: 1. RCC-  Need for nephrectomy - done on 7/9- c/b bleeding and progression to ESRD 2. ESRD - new diagnosis this admit-  HD initiated-  On a TTS schedule- next due today-  Has TDC and AVG placed 7/30.  CLIP in process-  Family desires [facility] 3. Anemia- hgb in the 7's-  On Darbe 200 weekly and s/p iron repletion 4. Secondary hyperparathyroidism- PTH 116, calc and phos WNL-  No meds 5. HTN/volume-  req midodrine for BP support as well as cortef.  Low dose metoprolol for Afib- min UF with HD 6. Malnutrition-  On supps 7. Dispo-  Possibly to [place]: Basic Metabolic Panel: Recent Labs  Lab 06/04/20 0434 06/05/20 0529 06/08/20 0241 06/09/20 0123 06/10/20 0307  NA 134*   < > 135 134* 135  K 4.4   < > 4.4 5.0 5.1  CL 97*   < > 98 97* 97*  CO2 21*   < > 23 22 21*  GLUCOSE 349*   < > 108* 159* 88  BUN 89*   < > 60* 86* 108*  CREATININE 9.32*   < > 7.13* 9.68* 11.56*  CALCIUM 8.2*   < > 8.3* 8.2* 8.1*  PHOS 4.1  --   --   --   --    < > = values in this interval not displayed.   Liver Function Tests: Recent Labs  Lab 06/04/20 0434 06/05/20 0529  AST  --  19  ALT  --  18  ALKPHOS  --  66  BILITOT  --  0.7  PROT  --  5.1*  ALBUMIN 2.4* 2.2*   No results for input(s): LIPASE, AMYLASE in the last 168 hours. No results for input(s): AMMONIA in the last  168 hours. CBC: Recent Labs  Lab 06/06/20 0418 06/06/20 0418 06/07/20 0200 06/07/20 0200 06/08/20 0241 06/09/20 0123 06/10/20 0307  WBC 13.4*   < > 12.3*   < > 10.4 9.7 7.8  HGB 7.7*   < > 8.4*   < > 7.6* 7.9* 8.0*  HCT 24.0*   < > 25.8*   < > 24.6* 24.9* 25.6*  MCV 94.1  --  94.2  --  93.5 93.6 93.4  PLT 300   < > 325   < > 277 321 283   < > = values in this interval not displayed.   Cardiac Enzymes: No results for input(s): CKTOTAL, CKMB, CKMBINDEX, TROPONINI in the last 168 hours. CBG: Recent Labs  Lab 06/09/20 1202 06/09/20 1620 06/09/20 1958 06/09/20 2336 06/10/20 0357  GLUCAP 148* 148* 135* 118* 84    Iron Studies: No results for input(s): IRON, TIBC, TRANSFERRIN,  FERRITIN in the last 72 hours. Studies/Results: No results found. Medications: Infusions: . sodium chloride Stopped (05/27/20 0829)  . sodium chloride    . sodium chloride    . sodium chloride    . feeding supplement (VITAL 1.5 CAL) Stopped (06/07/20 0654)    Scheduled Medications: . arformoterol  15 mcg Nebulization BID  . budesonide (PULMICORT) nebulizer solution  0.5 mg Nebulization BID  . chlorhexidine gluconate (MEDLINE KIT)  15 mL Mouth Rinse BID  . Chlorhexidine Gluconate Cloth  6 each Topical Q0600  . darbepoetin (ARANESP) injection - NON-DIALYSIS  200 mcg Subcutaneous Q Sat-1800  . feeding supplement (PROSource TF)  90 mL Per Tube BID  . hydrocortisone  10 mg Oral BID WC  . insulin aspart  0-9 Units Subcutaneous TID AC & HS  . insulin aspart  2 Units Subcutaneous Q4H  . insulin detemir  14 Units Subcutaneous QHS  . mouth rinse  15 mL Mouth Rinse q12n4p  . metoprolol tartrate  12.5 mg Oral BID  . midodrine  5 mg Oral TID WC  . sodium chloride flush  10-40 mL Intracatheter Q12H    have reviewed scheduled and prn medications.  Physical Exam: General: obese, pleasant-  Minimal mobility- NAD Heart: RRR Lungs: mostly clear Abdomen: obese, soft, non tender Extremities: min  edema Dialysis Access: left AVG-  thrill - right TDC    06/10/2020,7:30 AM  LOS: 25 days

## 2020-06-10 NOTE — Progress Notes (Signed)
IP rehab admissions:  Patient would like to come to CIR for a short LOS.  He has been medically cleared for CIR.  Bed available and will admit after HD completed today.  Call me for questions.  [phone]

## 2020-06-10 NOTE — Discharge Summary (Signed)
[facility] Discharge Summary  Admit date: 05/16/2020  Discharge date and time: 06/10/20   Discharge to: Home  Discharge Service: Urology  Discharge Attending Physician:  Dr Nicolette Bang  Discharge  Diagnosis: ESRD  Secondary Diagnoses:  Kidney cancer s/p right nephrectomy Aspiration Pneumonia Atrial fibrillation Malnutrition, deconditioning Anemia of chronic disease DM  OR Procedures: Procedure(s): INSERTION OF DIALYSIS CATHETER USING 23CM DOUBLE LUMEN CATHETER LEFT ARM ARTERIOVENOUS (AV) GRAFT USING 45CM GORTEX 06/06/2020   Ancillary Procedures: None   Discharge Day Services: The patient was seen and examined by the Urology team prior to discharge to CIR.  Vital signs and laboratory values were stable and within normal limits.  The physical exam was benign and unchanged and all surgical wounds were examined.   Subjective  No acute events overnight. Underwent HD today. Still intermittently hypotensive. Tolerating regular diet. Pain Controlled. No fever or chills.  Objective Patient Vitals for the past 8 hrs:  BP Temp Temp src Pulse Resp SpO2 Weight  06/10/20 1541 92/62 99.1 F (37.3 C) Oral 97 (!) 22 99 % --  06/10/20 1510 (!) 101/55 99 F (37.2 C) Oral (!) 104 (!) 24 97 % 111.1 kg  06/10/20 1500 (!) 88/76 -- -- 87 -- -- --  06/10/20 1430 94/60 -- -- 84 -- -- --  06/10/20 1400 (!) 90/51 -- -- 85 -- -- --  06/10/20 1330 (!) 91/55 -- -- 81 (!) 27 -- --  06/10/20 1300 (!) 78/50 -- -- 84 (!) 31 -- --  06/10/20 1230 (!) 84/49 -- -- 80 (!) 34 -- --  06/10/20 1200 (!) 93/54 -- -- 84 (!) 31 -- --  06/10/20 1139 (!) 92/55 -- -- 76 (!) 32 -- --  06/10/20 1138 (!) 93/52 -- -- 69 (!) 33 -- --  06/10/20 1130 (!) 100/55 98.8 F (37.1 C) Oral (!) 56 -- 96 % --   Total I/O In: -  Out: 574 [Other:574]  General Appearance:        No acute distress Lungs:                       Normal work of breathing on room air Heart:                                Regular rate and  rhythm Abdomen:                         Soft, non-tender, non-distended Extremities:                      Warm and well perfused   Hospital Course:  Todd Robertson is a 74 years old male with a history of right renal mass, prostate cancer, osteoarthritis, cardiomyopathy, CKD, diabetes mellitus type 2, gout, hypertension, asthma presented to hospital for elective nephrectomy. 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  He underwent right nephrectomy on 05/16/2020. Post operatively, he was transferred to the ICU given persistent hypotension and for possible initiation of dialysis given preoperative CKD. He was persistently anuric post operatively, and creatinine continued to increase. CT scan showed anticipated retroperitoneal hematoma, and patient underwent multiple transfusions during his stay for persistent anemia. Repeat CT scan showed resolving hematoma with no evidence of infection or active bleeding.  On 07/13, patient's mentation worsened and was increasingly hyperkalemic and volume overloaded. Developed SOB and hypoxia  and was subsequently reintubated. CRRT was initiated on 7/14, and patient was febrile with CXR concerning for aspiration PNA. On 07/15, pt went into Afib and was hypotensive while on CRRT, continuing to require pressors. Patient was extubated 7/19 and started on tube feeds via corpak. Given concerns for ileus, NGT was placed. Continued on CRRT and phenylephrine. Patient was eventually weaned off phenylephrine and transferred to progressive care on 7/27. Patient underwent R tunneled IJ HD cath inserted plus L forearm loop AV Gore-tex graft on 7/30. Patient was transitioned to HD.  Prior to discharge to inpatient rehab, corpak and NGT were removed and patient was tolerating regular diet. He was ambulating daily. He was stable on HD, with hospitalist service managing midodrine. Nephrology service was closely involved for management of dialysis, CRRT. Cardiology was  consulted to assist with new onset atrial fibrillation. He was not started on therapeutic anticoagulation prior to discharge.   Follow up: -Cardiology recommendations for ongoing anticoagulation -Nephrology regarding long term HD plan -Hospitalist/PCP for hx of asthma, DM, CHF, malnutrition  Consultants: Nephrology Urology PCCM Vascular surgery Cardiology  Procedures: 7/09 Rt nephrectomy 7/13 Rt IJ HD catheter by IR; worsening hypercapnia and AMS >> intubated,  7/14 start CRRT Rt IJ HD catheter 7/13 >> ETT 7/13 >> 7/17 Rt radial a line 7/15 > 7/18 Hemodialysis. Internal jugular hemodialysis catheter placement, left AV graft placement.  On 06/06/2020  Condition at Discharge: Improved

## 2020-06-10 NOTE — Progress Notes (Signed)
Subjective: Patient underwent HD today, had to stop early given hypotension. Ambulating. Tolerating regular diet, passing flatus. Corpak feeding tube discontinued. Does endorse throat soreness. Last BM yesterday AM. Afebrile. Ready for transition to CIR.   Objective: Vital signs in last 24 hours: Temp:  [97.9 F (36.6 C)-99.1 F (37.3 C)] 99.1 F (37.3 C) (08/03 1541) Pulse Rate:  [56-104] 97 (08/03 1541) Resp:  [16-34] 22 (08/03 1541) BP: (78-127)/(49-76) 92/62 (08/03 1541) SpO2:  [93 %-100 %] 99 % (08/03 1541) Weight:  [109.5 kg-111.1 kg] 111.1 kg (08/03 1510)  Intake/Output from previous day: No intake/output data recorded. Intake/Output this shift: Total I/O In: -  Out: 574 [Other:574]  Physical Exam:  General:alert, cooperative and appears stated age GI: soft, non tender, normal bowel sounds, no palpable masses, no organomegaly, no inguinal hernia Male genitalia: not done Extremities: extremities normal, atraumatic, no cyanosis or edema  Lab Results: Recent Labs    06/08/20 0241 06/09/20 0123 06/10/20 0307  HGB 7.6* 7.9* 8.0*  HCT 24.6* 24.9* 25.6*   BMET Recent Labs    06/09/20 0123 06/10/20 0307  NA 134* 135  K 5.0 5.1  CL 97* 97*  CO2 22 21*  GLUCOSE 159* 88  BUN 86* 108*  CREATININE 9.68* 11.56*  CALCIUM 8.2* 8.1*   No results for input(s): LABPT, INR in the last 72 hours. No results for input(s): LABURIN in the last 72 hours. Results for orders placed or performed during the hospital encounter of 05/16/20  MRSA PCR Screening     Status: None   Collection Time: 05/16/20  4:34 PM   Specimen: Nasal Mucosa; Nasopharyngeal  Result Value Ref Range Status   MRSA by PCR NEGATIVE NEGATIVE Final    Comment:        The GeneXpert MRSA Assay (FDA approved for NASAL specimens only), is one component of a comprehensive MRSA colonization surveillance program. It is not intended to diagnose MRSA infection nor to guide or monitor treatment for MRSA  infections. Performed at [facility], [place] [address], [place], [place] [address]   Culture, blood (routine x 2)     Status: None   Collection Time: 05/21/20  3:36 AM   Specimen: BLOOD  Result Value Ref Range Status   Specimen Description BLOOD LEFT ANTECUBITAL  Final   Special Requests   Final    BOTTLES DRAWN AEROBIC ONLY Blood Culture adequate volume   Culture   Final    NO GROWTH 5 DAYS Performed at [facility], [place] [address] [place], [place] [address]    Report Status 05/26/2020 FINAL  Final  Culture, blood (routine x 2)     Status: None   Collection Time: 05/21/20  3:36 AM   Specimen: BLOOD LEFT FOREARM  Result Value Ref Range Status   Specimen Description BLOOD LEFT FOREARM  Final   Special Requests   Final    BOTTLES DRAWN AEROBIC AND ANAEROBIC Blood Culture adequate volume   Culture   Final    NO GROWTH 5 DAYS Performed at [facility], [place] [address] [place], [place] [address]    Report Status 05/26/2020 FINAL  Final  Culture, respiratory (non-expectorated)     Status: None   Collection Time: 05/21/20  4:25 AM   Specimen: Tracheal Aspirate; Respiratory  Result Value Ref Range Status   Specimen Description TRACHEAL ASPIRATE  Final   Special Requests NONE  Final   Gram Stain   Final    FEW WBC  PRESENT, PREDOMINANTLY PMN RARE BUDDING YEAST SEEN    Culture   Final    FEW Consistent with normal respiratory flora. Performed at [facility], [place] [address] [place], [place] [address]    Report Status 05/23/2020 FINAL  Final  Culture, blood (routine x 2)     Status: None   Collection Time: 05/27/20  3:00 PM   Specimen: BLOOD LEFT HAND  Result Value Ref Range Status   Specimen Description BLOOD LEFT HAND  Final   Special Requests   Final    BOTTLES DRAWN AEROBIC ONLY Blood Culture adequate volume   Culture   Final    NO GROWTH 5 DAYS Performed at [facility], [place] [address], [place], [place] [address]    Report  Status 06/01/2020 FINAL  Final  Culture, blood (routine x 2)     Status: None   Collection Time: 05/27/20  3:15 PM   Specimen: BLOOD RIGHT HAND  Result Value Ref Range Status   Specimen Description BLOOD RIGHT HAND  Final   Special Requests IN PEDIATRIC BOTTLE Blood Culture adequate volume  Final   Culture   Final    NO GROWTH 5 DAYS Performed at [facility], [place] [address] [place], [place] [address]    Report Status 06/01/2020 FINAL  Final    Studies/Results: No results found.  Assessment/Plan: POD#25 Right radical nephrectomy, now with ESRD on HD. Tolerating iHD, regular diet, ready for transition to CIR  1. Continue HD per nephrology 2. Patient going to inpatient rehab today 3. Management of hypotension per hospitalist team 4. Afib management per cardiology    LOS: 25 days   Carmie Kanner 06/10/2020, 4:26 PM

## 2020-06-10 NOTE — Progress Notes (Signed)
OT Cancellation Note  Patient Details Name: Todd Robertson MRN: [id number] DOB: 09-04-1946   Cancelled Treatment:    Reason Eval/Treat Not Completed: Patient at procedure or test/ unavailable;Other (comment); checked on pt x2 this AM, pt initially eating breakfast, on second attempt reported having pain/discomfort in throat and requesting OT return at a later time. Pt was agreeable to OT checking back later however noted is now in HD. Will continue efforts.  Lou Cal, OT Acute Rehabilitation Services Pager [phone] Office [phone]   Raymondo Band 06/10/2020, 12:41 PM

## 2020-06-10 NOTE — Progress Notes (Signed)
Progress Note  Patient Name: Todd Robertson Date of Encounter: 06/10/2020  Primary Cardiologist: Dr. Harl Bowie  Subjective   Pt undergoing dialysis; blood pressure low in the 90s.  Inpatient Medications    Scheduled Meds: . arformoterol  15 mcg Nebulization BID  . budesonide (PULMICORT) nebulizer solution  0.5 mg Nebulization BID  . chlorhexidine gluconate (MEDLINE KIT)  15 mL Mouth Rinse BID  . Chlorhexidine Gluconate Cloth  6 each Topical Q0600  . darbepoetin (ARANESP) injection - NON-DIALYSIS  200 mcg Subcutaneous Q Sat-1800  . feeding supplement (PROSource TF)  90 mL Per Tube BID  . hydrocortisone  10 mg Oral BID WC  . insulin aspart  0-9 Units Subcutaneous TID AC & HS  . insulin aspart  2 Units Subcutaneous Q4H  . insulin detemir  14 Units Subcutaneous QHS  . mouth rinse  15 mL Mouth Rinse q12n4p  . metoprolol tartrate  12.5 mg Oral BID  . midodrine  5 mg Oral TID WC  . sodium chloride flush  10-40 mL Intracatheter Q12H   Continuous Infusions: . sodium chloride Stopped (05/27/20 0829)  . sodium chloride    . sodium chloride    . sodium chloride    . feeding supplement (VITAL 1.5 CAL) Stopped (06/07/20 0654)   PRN Meds: sodium chloride, sodium chloride, sodium chloride, acetaminophen **OR** acetaminophen, diphenhydrAMINE **OR** diphenhydrAMINE, heparin, heparin, levalbuterol, lidocaine, loperamide HCl, menthol-cetylpyridinium, metoprolol tartrate, ondansetron (ZOFRAN) IV, phenol, promethazine, Resource ThickenUp Clear, sodium chloride flush, tiZANidine   Vital Signs    Vitals:   06/10/20 0359 06/10/20 0729 06/10/20 0731 06/10/20 0745  BP:    (!) 127/55  Pulse:    86  Resp:    18  Temp:    97.9 F (36.6 C)  TempSrc:    Oral  SpO2:  95% 95% 100%  Weight: 109.5 kg     Height:       No intake or output data in the 24 hours ending 06/10/20 1225    Filed Weights   06/07/20 1145 06/08/20 0619 06/10/20 0359  Weight: (!) 111.8 kg (!) 115 kg 109.5 kg     Telemetry    Atrial Fibrillaton in the 80 - 90s - Personally Reviewed  ECG    06/07/20 ECG (independently read by me): A flutter 132, RBBB Q 3, aVF  Physical Exam   BP (!) 127/55 (BP Location: Right Arm)   Pulse 86   Temp 97.9 F (36.6 C) (Oral)   Resp 18   Ht 6' 2"  (1.88 m)   Wt 109.5 kg   SpO2 100%   BMI 30.99 kg/m  General: Alert, oriented, no distress.  Skin: normal turgor, no rashes, warm and dry HEENT: Normocephalic, atraumatic. Pupils equal round and reactive to light; sclera anicteric; extraocular muscles intact;  Nose without nasal septal hypertrophy Mouth/Parynx benign; Mallinpatti scale 4 Neck: Thick neckNo JVD, no carotid bruits; normal carotid upstroke Lungs: clear to ausculatation and percussion; no wheezing or rales Chest wall: without tenderness to palpitation Heart: PMI not displaced, irregular irregular, s1 s2 normal, 1/6 systolic murmur, no diastolic murmur, no rubs, gallops, thrills, or heaves Abdomen: central adipositysoft, nontender; no hepatosplenomehaly, BS+; abdominal aorta nontender and not dilated by palpation. Back: no CVA tenderness Pulses 2+ soft thrill L forearm Musculoskeletal: full range of motion, normal strength, no joint deformities Extremities: no clubbing cyanosis or edema, Homan's sign negative  Neurologic: grossly nonfocal; Cranial nerves grossly wnl Psychologic: Normal mood and affect   Labs  Chemistry Recent Labs  Lab 06/04/20 0434 06/04/20 0434 06/05/20 0529 06/06/20 0418 06/08/20 0241 06/09/20 0123 06/10/20 0307  NA 134*   < > 133*   < > 135 134* 135  K 4.4   < > 4.0   < > 4.4 5.0 5.1  CL 97*   < > 96*   < > 98 97* 97*  CO2 21*   < > 24   < > 23 22 21*  GLUCOSE 349*   < > 326*   < > 108* 159* 88  BUN 89*   < > 66*   < > 60* 86* 108*  CREATININE 9.32*   < > 7.25*   < > 7.13* 9.68* 11.56*  CALCIUM 8.2*   < > 8.2*   < > 8.3* 8.2* 8.1*  PROT  --   --  5.1*  --   --   --   --   ALBUMIN 2.4*  --  2.2*  --   --    --   --   AST  --   --  19  --   --   --   --   ALT  --   --  18  --   --   --   --   ALKPHOS  --   --  66  --   --   --   --   BILITOT  --   --  0.7  --   --   --   --   GFRNONAA 5*   < > 7*   < > 7* 5* 4*  GFRAA 6*   < > 8*   < > 8* 6* 4*  ANIONGAP 16*   < > 13   < > 14 15 17*   < > = values in this interval not displayed.     Hematology Recent Labs  Lab 06/08/20 0241 06/09/20 0123 06/10/20 0307  WBC 10.4 9.7 7.8  RBC 2.63* 2.66* 2.74*  HGB 7.6* 7.9* 8.0*  HCT 24.6* 24.9* 25.6*  MCV 93.5 93.6 93.4  MCH 28.9 29.7 29.2  MCHC 30.9 31.7 31.3  RDW 19.2* 18.4* 17.9*  PLT 277 321 283    Cardiac EnzymesNo results for input(s): TROPONINI in the last 168 hours. No results for input(s): TROPIPOC in the last 168 hours.   BNPNo results for input(s): BNP, PROBNP in the last 168 hours.   DDimer No results for input(s): DDIMER in the last 168 hours.   Lipid Panel  No results found for: CHOL, TRIG, HDL, CHOLHDL, VLDL, LDLCALC, LDLDIRECT   Radiology    No results found.  Cardiac Studies    ECHO 05/28/20 IMPRESSIONS  1. Left ventricular ejection fraction, by estimation, is 45 to 50%. The  left ventricle has mildly decreased function. The left ventricle  demonstrates global hypokinesis. There is mild left ventricular  hypertrophy. Left ventricular diastolic parameters  are consistent with Grade II diastolic dysfunction (pseudonormalization).  2. Right ventricular systolic function is normal. The right ventricular  size is mildly enlarged. Tricuspid regurgitation signal is inadequate for  assessing PA pressure.  3. Right atrial size was mildly dilated.  4. The mitral valve is normal in structure. No evidence of mitral valve  regurgitation. No evidence of mitral stenosis.  5. The aortic valve is tricuspid. Aortic valve regurgitation is not  visualized. Mild aortic valve sclerosis is present, with no evidence of  aortic valve stenosis.  6. Aortic dilatation noted. There  is  mild dilatation of the ascending  aorta measuring 41 mm.  7. Technically difficult study with poor images.   Patient Profile  Todd Robertson is a 74 y.o. 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  Assessment & Plan    1. Atrial Fibrillation; rate currently in the 80s.  Undergoing dialysis.  BP is low in dialysis precluding further titration of rate control medication.  Currently he has been on metoprolol tartrate 12.5 mg twice a day.  CHA2DS2-VASc score of 4 is consistent with need for anticoagulation since; however, high bleed risk may preclude initiation.  May need to hold metoprolol on hemodialysis days.  2.  Remote history of cardiomyopathy with EF 25% in 2004.  EF 2018 55 to 60%.  Most recent echo reduced LV function with EF 45 to 50% and grade 2 diastolic dysfunction; appears comfortable, not short of breath.   3.  ESRD  currently undergoing hemodialysis.  Serum creatinine 11.56  Today; left forearm fistula in place  4  Hypotension: Currently on midodrine 5 mg 3 times daily.  Signed, Troy Sine, MD, [facility] 06/10/2020, 12:25 PM

## 2020-06-10 NOTE — Progress Notes (Addendum)
Inpatient Rehabilitation Medication Review by a Pharmacist  A complete drug regimen review was completed for this patient to identify any potential clinically significant medication issues.  Clinically significant medication issues were identified:  no  Check AMION for pharmacist assigned to patient if future medication questions/issues arise during this admission.  Pharmacist comments:   Time spent performing this drug regimen review (minutes):  10 minutes   [place] 06/10/2020 8:34 PM  Pt already got the albumin before transfer. Dc duplicate order.

## 2020-06-10 NOTE — H&P (Signed)
Physical Medicine and Rehabilitation Admission H&P     CC: Debility     HPI: Todd Robertson. 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 He was admitted on 05/16/20 for right nephrectomy with lysis of adhesis by Dr. Alyson Ingles. Post op course significant for hypotension with acute on chronic renal failure as well as ABLA due to large volume hemorrhage into right nephrectomy bed. He developed volume overload as well as acute metabolic encephalopathy due to uremia requiring CRRT. He developed septic shock requiring pressors and acute respiratory failure requiring with reintubation 07/13- 07/17. He was treated with broad spectrum antibiotics for HCAP and was weaned off pressors .  He has recurrent drop in H/H on 07/20 requiring one unit PRBC as well as vasopressors. He was started on midodrine. Repeat CT abdomen on 07/21 showed hematoma resolving.     Hospital course significant for persistent hypotension --required CRRT and stress dose steroids, episode of NSVT,  recurrent N/V concerning for ileus, blood output from NGT, persistent leucocytosis with recurrent fevers. BLE dopplers were negative for DVT.  He defervesced without antibiotics and Right IJ tunneled catheter and left forearm graft placed on 07/31 by Dr. Donnetta Hutching.  He developed A fib with RVR on 07/31 and was started on low dose BB.  Cardiology consulted on 08/02 and felt that patient at high risk for [facility] and heart rate controlled but BP remain soft and may need to hold BB on dialysis days. Stress dose steroids being weaned off and midodrine decreased to 5 mg tid today. Therapy ongoing and CIR recommended due to debility.      Review of Systems  Constitutional: Negative for fever.  HENT: Negative for congestion and ear pain.   Eyes: Negative for double vision.  Respiratory: Positive for cough.   Cardiovascular: Negative for chest pain.  Gastrointestinal:  Negative for nausea and vomiting.  Musculoskeletal: Negative for myalgias and neck pain.  Skin: Negative for rash.  Neurological: Positive for weakness. Negative for dizziness.  Psychiatric/Behavioral: Negative for depression.            Past Medical History:  Diagnosis Date  . Arthritis    . Cancer [facility])      prostate  . Cardiomyopathy      secondary  . CKD (chronic kidney disease)    . DM2 (diabetes mellitus, type 2) ([name])    . Gout    . HTN (hypertension)      unspec  . Hypercholesterolemia    . Nonischemic cardiomyopathy ([place])      a. EF 40-50% by most recent 2-D echo b. EF 25% by cardiac cath in 2004  . Overweight(278.02)             Past Surgical History:  Procedure Laterality Date  . AV FISTULA PLACEMENT Left 06/06/2020    Procedure: LEFT ARM ARTERIOVENOUS (AV) GRAFT USING 45CM GORTEX;  Surgeon: [name], MD;  Location: [place];  Service: Vascular;  Laterality: Left;  . COLONOSCOPY      . HERNIA REPAIR      . INSERTION OF DIALYSIS CATHETER Right 06/06/2020    Procedure: INSERTION OF DIALYSIS CATHETER USING 23CM DOUBLE LUMEN CATHETER;  Surgeon: [name], MD;  Location: [place];  Service: Vascular;  Laterality: Right;  . IR FLUORO GUIDE CV LINE RIGHT   05/20/2020  . IR US GUIDE VASC ACCESS RIGHT   05/20/2020  .  KNEE ARTHROPLASTY Left 08/08/2017    Procedure: LEFT TOTAL KNEE ARTHROPLASTY WITH COMPUTER NAVIGATION;  Surgeon: Rod Can, MD;  Location: [place];  Service: Orthopedics;  Laterality: Left;  Needs RNFA  . KNEE ARTHROPLASTY Right 11/17/2017    Procedure: RIGHT TOTAL KNEE ARTHROPLASTY WITH COMPUTER NAVIGATION;  Surgeon: Rod Can, MD;  Location[facility];  Service: Orthopedics;  Laterality: Right;  NEEDS RNFA  . ROBOT ASSISTED LAPAROSCOPIC NEPHRECTOMY Right 05/16/2020    Procedure: XI ROBOTIC ASSISTED LAPAROSCOPIC NEPHRECTOMY;  Surgeon: Cleon Gustin, MD;  Location: [facility];  Service: Urology;  Laterality: Right;  2.5 hrs           Family History   Problem Relation Age of Onset  . Hypertension Mother    . Kidney disease Mother    . Heart disease Father    . Kidney disease Other        Social History: Married. 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 Active--works out 45 minutes/5 days week at home.  He reports that he quit smoking about 35 years ago. His smoking use included cigarettes. He started smoking about 54 years ago. He has a 10.00 pack-year smoking history. He has never used smokeless tobacco. He reports that he does not drink alcohol and does not use drugs.      Allergies: No Known Allergies            Medications Prior to Admission  Medication Sig Dispense Refill  . acetaminophen (TYLENOL) 500 MG tablet Take 1,000 mg by mouth every 6 (six) hours as needed for moderate pain or headache.      . albuterol (VENTOLIN HFA) 108 (90 Base) MCG/ACT inhaler Inhale 1-2 puffs into the lungs every 6 (six) hours as needed for wheezing or shortness of breath.       . allopurinol (ZYLOPRIM) 300 MG tablet Take 300 mg by mouth every morning.      Marland Kitchen aspirin EC 81 MG tablet Take 81 mg by mouth daily.      Marland Kitchen atorvastatin (LIPITOR) 40 MG tablet Take 1 tablet (40 mg total) by mouth daily. (Patient taking differently: Take 40 mg by mouth every evening. ) 90 tablet 3  . benazepril (LOTENSIN) 20 MG tablet Take 20 mg by mouth every morning.       . budesonide-formoterol (SYMBICORT) 160-4.5 MCG/ACT inhaler Inhale 2 puffs into the lungs 2 (two) times daily.      . bumetanide (BUMEX) 1 MG tablet Take 1 mg by mouth See admin instructions. Take 1 mg daily, may take a second 1 mg dose as needed for swelling      . carvedilol (COREG) 25 MG tablet Take 1 tablet (25 mg total) by mouth 2 (two) times daily. 180 tablet 3  . Cholecalciferol (VITAMIN D) 50 MCG (2000 UT) tablet Take 2,000 Units by mouth daily.      Marland Kitchen loratadine (CLARITIN) 10 MG tablet Take 10 mg by mouth daily.      . Multiple Vitamin (MULTIVITAMIN) tablet Take 1 tablet by mouth daily.      .  Tetrahydrozoline HCl (VISINE OP) Place 1 drop into both eyes daily as needed (dryness).      Marland Kitchen diclofenac Sodium (VOLTAREN) 1 % GEL Apply 1 application topically 4 (four) times daily as needed (pain).      Marland Kitchen senna (SENOKOT) 8.6 MG tablet Take 1 tablet by mouth daily as needed for constipation.       Marland Kitchen tiZANidine (ZANAFLEX) 4 MG tablet Take 4 mg  by mouth every 8 (eight) hours as needed for muscle spasms.           Drug Regimen Review  Drug regimen was reviewed and remains appropriate with no significant issues identified   Home: Home Living Family/patient expects to be discharged to:: Private residence Living Arrangements: Spouse/significant other Available Help at Discharge: Family, Available 24 hours/day Type of Home: House Home Access: Stairs to enter [employer] of Steps: 1 Home Layout: One level Bathroom Shower/Tub: Tub/shower unit, Architectural technologist: Handicapped height Bathroom Accessibility: Yes (to RW, not w/c) Home Equipment: Walker - 2 wheels, Bedside commode   Functional History: Prior Function Level of Independence: Independent Comments: With ADL's and iADL's   Functional Status:  Mobility: Bed Mobility Overal bed mobility: Needs Assistance Bed Mobility: Supine to Sit Rolling: Min assist Sidelying to sit: Mod assist, +2 for safety/equipment, HOB elevated Supine to sit: Min assist, HOB elevated Sit to supine: Min guard General bed mobility comments: Assist to elevate trunk into sitting Transfers Overall transfer level: Needs assistance Equipment used: Rolling walker (2 wheeled) Transfers: Sit to/from Stand, [employer] Transfers Sit to Stand: From elevated surface, Min assist Stand pivot transfers: Min assist  Lateral/Scoot Transfers: +2 physical assistance, Mod assist General transfer comment: Assist to bring hips up and for balance. Verbal cues for hand placement. Ambulation/Gait Ambulation/Gait assistance: Min assist Gait Distance (Feet):  45 Feet (15' x 1, 30' x 1, 45' x 1) Assistive device: Rolling walker (2 wheeled) Gait Pattern/deviations: Step-through pattern, Decreased stride length, Trunk flexed General Gait Details: Assist for balance and support. Pt fatigued quickly. Brought rollator behind pt for pt to sit for frequent rest breaks. Gait velocity: decr Gait velocity interpretation: <1.8 ft/sec, indicate of risk for recurrent falls   ADL: ADL Overall ADL's : Needs assistance/impaired Eating/Feeding: NPO Grooming: Wash/dry hands, Set up, Sitting Grooming Details (indicate cue type and reason): for support at elbow  Upper Body Bathing: Minimal assistance, Sitting, Bed level Upper Body Bathing Details (indicate cue type and reason): minA for support at elbow for shoulder ROM to access armpits for washing and donning deodorant Lower Body Bathing: Total assistance Lower Body Bathing Details (indicate cue type and reason): peri care during session due to void.  Upper Body Dressing : Maximal assistance, Sitting Lower Body Dressing: Maximal assistance Lower Body Dressing Details (indicate cue type and reason): modA+2 to stand  Toilet Transfer: Minimal assistance, +2 for safety/equipment, Ambulation, RW, Grab bars, Regular Toilet Toilet Transfer Details (indicate cue type and reason): mod-max A for sit <> stand. Pt limited by dizziness and nausea with positional changes Toileting- Clothing Manipulation and Hygiene: Maximal assistance, +2 for safety/equipment, Sit to/from stand Toileting - Clothing Manipulation Details (indicate cue type and reason): assist for pericare in standing after BM Functional mobility during ADLs: Minimal assistance, +2 for safety/equipment, Rolling walker General ADL Comments: pt on bed pan that patient reports is extended time but had not called RN staff to help off pan. Only when therapist asked did patient indicate need to get off pan. Pt noted to have diarrhea void at this time with light brown  color. Pt with skin break down at sacrum   Cognition: Cognition Overall Cognitive Status: Within Functional Limits for tasks assessed Orientation Level: Oriented X4 Cognition Arousal/Alertness: Awake/alert Behavior During Therapy: WFL for tasks assessed/performed, Flat affect Overall Cognitive Status: Within Functional Limits for tasks assessed General Comments: requires encouragement to engage with therapist as he is initially very limited in verbalizations, appears [facility] for basic  tasks today      Blood pressure 92/62, pulse 97, temperature 99.1 F (37.3 C), temperature source Oral, resp. rate (!) 22, height 6\' 2"  (1.88 m), weight 111.1 kg, SpO2 99 %. Physical Exam Vitals and nursing note reviewed.  Constitutional:      Appearance: Normal appearance.  HENT:     Head: Normocephalic.     Nose: Nose normal.  Eyes:     Pupils: Pupils are equal, round, and reactive to light.  Cardiovascular:     Rate and Rhythm: Tachycardia present.     Comments: HR in 105-110 at rest Pulmonary:     Effort: Pulmonary effort is normal.  Abdominal:     General: Bowel sounds are normal. There is no distension.     Tenderness: There is no abdominal tenderness.     Comments: RLQ distended--resolving hematoma under incision? Incisions C/D/I with skin glue in place.   Musculoskeletal:        General: Swelling (trace LUE) present.     Cervical back: Normal range of motion.     Comments: Well bilateral TKR incisions.   Skin:    General: Skin is warm.  Neurological:     Mental Status: He is alert and oriented to person, place, and time.     Cranial Nerves: No cranial nerve deficit.     Sensory: No sensory deficit.     Motor: Weakness present.  Psychiatric:     Comments: flat        Lab Results Last 48 Hours        Results for orders placed or performed during the hospital encounter of 05/16/20 (from the past 48 hour(s))  Glucose, capillary     Status: Abnormal    Collection Time: 06/08/20  8:27  PM  Result Value Ref Range    Glucose-Capillary 160 (H) 70 - 99 mg/dL      Comment: Glucose reference range applies only to samples taken after fasting for at least 8 hours.  Glucose, capillary     Status: Abnormal    Collection Time: 06/09/20 12:31 AM  Result Value Ref Range    Glucose-Capillary 150 (H) 70 - 99 mg/dL      Comment: Glucose reference range applies only to samples taken after fasting for at least 8 hours.  CBC     Status: Abnormal    Collection Time: 06/09/20  1:23 AM  Result Value Ref Range    WBC 9.7 4.0 - 10.5 K/uL    RBC 2.66 (L) 4.22 - 5.81 MIL/uL    Hemoglobin 7.9 (L) 13.0 - 17.0 g/dL    HCT 24.9 (L) 39 - 52 %    MCV 93.6 80.0 - 100.0 fL    MCH 29.7 26.0 - 34.0 pg    MCHC 31.7 30.0 - 36.0 g/dL    RDW 18.4 (H) 11.5 - 15.5 %    Platelets 321 150 - 400 K/uL    nRBC 0.2 0.0 - 0.2 %      Comment: Performed at [facility] [address], [place], [place] [address]  Basic metabolic panel     Status: Abnormal    Collection Time: 06/09/20  1:23 AM  Result Value Ref Range    Sodium 134 (L) 135 - 145 mmol/L    Potassium 5.0 3.5 - 5.1 mmol/L    Chloride 97 (L) 98 - 111 mmol/L    CO2 22 22 - 32 mmol/L    Glucose, Bld 159 (H) 70 -  99 mg/dL      Comment: Glucose reference range applies only to samples taken after fasting for at least 8 hours.    BUN 86 (H) 8 - 23 mg/dL    Creatinine, Ser 9.68 (H) 0.61 - 1.24 mg/dL    Calcium 8.2 (L) 8.9 - 10.3 mg/dL    GFR calc non Af Amer 5 (L) >60 mL/min    GFR calc Af Amer 6 (L) >60 mL/min    Anion gap 15 5 - 15      Comment: Performed at [facility] [address], [place], [place] [address]  Magnesium     Status: None    Collection Time: 06/09/20  1:23 AM  Result Value Ref Range    Magnesium 2.3 1.7 - 2.4 mg/dL      Comment: Performed at [facility] [address] [place], [place] [address]  Glucose, capillary     Status: Abnormal    Collection Time: 06/09/20  4:43 AM  Result Value Ref Range     Glucose-Capillary 144 (H) 70 - 99 mg/dL      Comment: Glucose reference range applies only to samples taken after fasting for at least 8 hours.  Glucose, capillary     Status: Abnormal    Collection Time: 06/09/20  7:57 AM  Result Value Ref Range    Glucose-Capillary 109 (H) 70 - 99 mg/dL      Comment: Glucose reference range applies only to samples taken after fasting for at least 8 hours.    Comment 1 Notify RN      Comment 2 Document in Chart    Glucose, capillary     Status: Abnormal    Collection Time: 06/09/20 12:02 PM  Result Value Ref Range    Glucose-Capillary 148 (H) 70 - 99 mg/dL      Comment: Glucose reference range applies only to samples taken after fasting for at least 8 hours.    Comment 1 Notify RN      Comment 2 Document in Chart    Glucose, capillary     Status: Abnormal    Collection Time: 06/09/20  4:20 PM  Result Value Ref Range    Glucose-Capillary 148 (H) 70 - 99 mg/dL      Comment: Glucose reference range applies only to samples taken after fasting for at least 8 hours.    Comment 1 Notify RN      Comment 2 Document in Chart    Glucose, capillary     Status: Abnormal    Collection Time: 06/09/20  7:58 PM  Result Value Ref Range    Glucose-Capillary 135 (H) 70 - 99 mg/dL      Comment: Glucose reference range applies only to samples taken after fasting for at least 8 hours.  Glucose, capillary     Status: Abnormal    Collection Time: 06/09/20 11:36 PM  Result Value Ref Range    Glucose-Capillary 118 (H) 70 - 99 mg/dL      Comment: Glucose reference range applies only to samples taken after fasting for at least 8 hours.  Basic metabolic panel     Status: Abnormal    Collection Time: 06/10/20  3:07 AM  Result Value Ref Range    Sodium 135 135 - 145 mmol/L    Potassium 5.1 3.5 - 5.1 mmol/L    Chloride 97 (L) 98 - 111 mmol/L    CO2 21 (L) 22 - 32 mmol/L  Glucose, Bld 88 70 - 99 mg/dL      Comment: Glucose reference range applies only to samples taken  after fasting for at least 8 hours.    BUN 108 (H) 8 - 23 mg/dL    Creatinine, Ser 11.56 (H) 0.61 - 1.24 mg/dL    Calcium 8.1 (L) 8.9 - 10.3 mg/dL    GFR calc non Af Amer 4 (L) >60 mL/min    GFR calc Af Amer 4 (L) >60 mL/min    Anion gap 17 (H) 5 - 15      Comment: Performed at [facility] [address], [place], [place] [address]  CBC     Status: Abnormal    Collection Time: 06/10/20  3:07 AM  Result Value Ref Range    WBC 7.8 4.0 - 10.5 K/uL    RBC 2.74 (L) 4.22 - 5.81 MIL/uL    Hemoglobin 8.0 (L) 13.0 - 17.0 g/dL    HCT 25.6 (L) 39 - 52 %    MCV 93.4 80.0 - 100.0 fL    MCH 29.2 26.0 - 34.0 pg    MCHC 31.3 30.0 - 36.0 g/dL    RDW 17.9 (H) 11.5 - 15.5 %    Platelets 283 150 - 400 K/uL    nRBC 0.4 (H) 0.0 - 0.2 %      Comment: Performed at [facility], [place] [address] [place], [place] [address]  Magnesium     Status: None    Collection Time: 06/10/20  3:07 AM  Result Value Ref Range    Magnesium 2.4 1.7 - 2.4 mg/dL      Comment: Performed at [facility] [address] [place], [place] [address]  Glucose, capillary     Status: None    Collection Time: 06/10/20  3:57 AM  Result Value Ref Range    Glucose-Capillary 84 70 - 99 mg/dL      Comment: Glucose reference range applies only to samples taken after fasting for at least 8 hours.  Glucose, capillary     Status: None    Collection Time: 06/10/20  3:57 AM  Result Value Ref Range    Glucose-Capillary 84 70 - 99 mg/dL      Comment: Glucose reference range applies only to samples taken after fasting for at least 8 hours.  Glucose, capillary     Status: Abnormal    Collection Time: 06/10/20  8:07 AM  Result Value Ref Range    Glucose-Capillary 61 (L) 70 - 99 mg/dL      Comment: Glucose reference range applies only to samples taken after fasting for at least 8 hours.  Glucose, capillary     Status: Abnormal    Collection Time: 06/10/20  8:07 AM  Result Value Ref Range    Glucose-Capillary 61 (L) 70 - 99  mg/dL      Comment: Glucose reference range applies only to samples taken after fasting for at least 8 hours.  Glucose, capillary     Status: Abnormal    Collection Time: 06/10/20  8:20 AM  Result Value Ref Range    Glucose-Capillary 62 (L) 70 - 99 mg/dL      Comment: Glucose reference range applies only to samples taken after fasting for at least 8 hours.  Glucose, capillary     Status: Abnormal    Collection Time: 06/10/20  8:20 AM  Result Value Ref Range    Glucose-Capillary 62 (L) 70 - 99 mg/dL  Comment: Glucose reference range applies only to samples taken after fasting for at least 8 hours.  Glucose, capillary     Status: None    Collection Time: 06/10/20  8:33 AM  Result Value Ref Range    Glucose-Capillary 75 70 - 99 mg/dL      Comment: Glucose reference range applies only to samples taken after fasting for at least 8 hours.  Glucose, capillary     Status: None    Collection Time: 06/10/20  8:33 AM  Result Value Ref Range    Glucose-Capillary 75 70 - 99 mg/dL      Comment: Glucose reference range applies only to samples taken after fasting for at least 8 hours.  Glucose, capillary     Status: Abnormal    Collection Time: 06/10/20 10:46 AM  Result Value Ref Range    Glucose-Capillary 117 (H) 70 - 99 mg/dL      Comment: Glucose reference range applies only to samples taken after fasting for at least 8 hours.      Imaging Results (Last 48 hours)  No results found.           Medical Problem List and Plan: 1.  Functional and mobility deficits secondary to debility after right nephrectomy and multiple post-op medical complications             -patient may   shower             -ELOS/Goals: 5-7 days, mod I 2.  Antithrombotics: -DVT/anticoagulation:  Mechanical: Sequential compression devices, below knee Bilateral lower extremities             -antiplatelet therapy: N/A 3. Pain Management: Tylenol prn   4. Mood: LCSW to follow for evaluation and support.               -antipsychotic agents: N/A 5. Neuropsych: This patient is capable of making decisions on his own behalf. 6. Skin/Wound Care: Monitor wound for healing. Protein supplements to promote healing.  7. Fluids/Electrolytes/Nutrition: Change to Carb modified/renal diet--not on fluid restriction at this time.  Monitor strict I/O as well as daily weights.  9. Persistent Hypotension: SBP 80-90 range--patient asymptomatic. On midodrine (decreased to 5 mg tid on 08/03)  as well as steroids (decreased to 10 mg bid on 08/02)--recommendations to wean as tolerated.   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 HD TTS--will schedule at the end of the day to help with tolerance of therapy.  11. ABLA:Serial labs with HD. On areanesp weekly and s/p iron repletion.  12. New onset A fib with RVR: Monitor HR tid--on low dose metoprolol bid.  13. H/o Asthma: Continue pulmicort, brovana and Xopenex nebs.   14. T2DM diet controlled: Hgb A1C- 6.2 and controlled. Will monitor BS ac/hs and use SSI for elevated BS--likely due to stress and steroids.  Will continue Lantus for now and wean as able.            Bary Leriche, PA-C 06/10/2020  I have personally performed a face to face diagnostic evaluation of this patient and formulated the key components of the plan.  Additionally, I have personally reviewed laboratory data, imaging studies, as well as relevant notes and concur with the physician assistant's documentation above.  The patient's status has not changed from the original H&P.  Any changes in documentation from the acute care chart have been noted above.  Meredith Staggers, MD, [facility]

## 2020-06-10 NOTE — Progress Notes (Signed)
Renal Navigator met with patient at bedside this morning to discuss referral for OP HD treatment for ESRD. Referral made now that cortrak is out and HepB panel labs are complete. Patient requests that Navigator speak with either his wife or daughter about all arrangements. Renal Navigator received notification from [facility] that they do not have availability in the [place] clinic at this time, but will move him to that clinic when a seat becomes available in the future. They cannot say when a seat will become available. Clinic Manager in [place] is covering [place] clinic also, and states that [facility] can accommodate patient if family is agreeable. Renal Navigator called patient's wife to explain situation and offer seat at [facility] with same MD group vs searching for seat in [place] or [place]. Patient's wife is agreeable to [place] until patient can be moved to [place].  Renal Navigator sent message to [facility] Navigator will follow closely.  Alphonzo Cruise, [place] Renal Navigator [phone]

## 2020-06-10 NOTE — Progress Notes (Signed)
Pt arrived on unit at [place]. Pt is in bed, alert and oriented, no complaints of pain. Family was at bedside.   Bertram Millard, LPN.

## 2020-06-11 ENCOUNTER — Inpatient Hospital Stay (HOSPITAL_COMMUNITY): Payer: Medicare Other | Admitting: Occupational Therapy

## 2020-06-11 ENCOUNTER — Inpatient Hospital Stay (HOSPITAL_COMMUNITY): Payer: Medicare Other

## 2020-06-11 ENCOUNTER — Inpatient Hospital Stay (HOSPITAL_COMMUNITY): Payer: Medicare Other | Admitting: Physical Therapy

## 2020-06-11 LAB — GLUCOSE, CAPILLARY
Glucose-Capillary: 102 mg/dL — ABNORMAL HIGH (ref 70–99)
Glucose-Capillary: 119 mg/dL — ABNORMAL HIGH (ref 70–99)
Glucose-Capillary: 91 mg/dL (ref 70–99)
Glucose-Capillary: 104 mg/dL — ABNORMAL HIGH (ref 70–99)

## 2020-06-11 MED ORDER — CHLORHEXIDINE GLUCONATE CLOTH 2 % EX PADS
6.0000 | MEDICATED_PAD | Freq: Every day | CUTANEOUS | Status: DC
  Administered 2020-06-13: 6 via TOPICAL

## 2020-06-11 MED ORDER — MIDODRINE HCL 5 MG PO TABS
10.0000 mg | ORAL_TABLET | Freq: Three times a day (TID) | ORAL | Status: DC
  Administered 2020-06-11 – 2020-06-25 (×40): 10 mg via ORAL
  Filled 2020-06-11 (×42): qty 2

## 2020-06-11 NOTE — Evaluation (Addendum)
Occupational Therapy Assessment and Plan  Patient Details  Name: JOSHUS ROGAN MRN: [id number] Date of Birth: May 31, 1946  OT Diagnosis: muscle weakness (generalized) Rehab Potential: Rehab Potential (ACUTE ONLY): Good ELOS: 7-10 days   Today's Date: 06/11/2020 OT Individual Time: 1100-1204 OT Individual Time Calculation (min): 64 min     Hospital Problem: Principal Problem:   Physical debility   Past Medical History:  Past Medical History:  Diagnosis Date  . Arthritis   . Cancer [facility])    prostate  . Cardiomyopathy    secondary  . CKD (chronic kidney disease)   . DM2 (diabetes mellitus, type 2) ([place])   . Gout   . HTN (hypertension)    unspec  . Hypercholesterolemia   . Nonischemic cardiomyopathy ([place])    a. EF 40-50% by most recent 2-D echo b. EF 25% by cardiac cath in 2004  . Overweight(278.02)    Past Surgical History:  Past Surgical History:  Procedure Laterality Date  . AV FISTULA PLACEMENT Left 06/06/2020   Procedure: LEFT ARM ARTERIOVENOUS (AV) GRAFT USING 45CM GORTEX;  Surgeon: [name], MD;  Location: [place];  Service: Vascular;  Laterality: Left;  . COLONOSCOPY    . HERNIA REPAIR    . INSERTION OF DIALYSIS CATHETER Right 06/06/2020   Procedure: INSERTION OF DIALYSIS CATHETER USING 23CM DOUBLE LUMEN CATHETER;  Surgeon: [name], MD;  Location: [place];  Service: Vascular;  Laterality: Right;  . IR FLUORO GUIDE CV LINE RIGHT  05/20/2020  . IR US GUIDE VASC ACCESS RIGHT  05/20/2020  . KNEE ARTHROPLASTY Left 08/08/2017   Procedure: LEFT TOTAL KNEE ARTHROPLASTY WITH COMPUTER NAVIGATION;  Surgeon: Rod Can, MD;  Location: [place];  Service: Orthopedics;  Laterality: Left;  Needs RNFA  . KNEE ARTHROPLASTY Right 11/17/2017   Procedure: RIGHT TOTAL KNEE ARTHROPLASTY WITH COMPUTER NAVIGATION;  Surgeon: Rod Can, MD;  [facility];  Service: Orthopedics;  Laterality: Right;  NEEDS RNFA  . 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 He was admitted on 05/16/20 for right nephrectomy with lysis of adhesis by Dr. Alyson Ingles. Post op course significant for hypotension with acute on chronic renal failure as well as ABLA due to large volume hemorrhage into right nephrectomy bed. He developed volume overload as well as acute metabolic encephalopathy due to uremia requiring CRRT. He developed septic shock requiring pressors and acute respiratory failure requiring with reintubation 07/13- 07/17. He was treated with broad spectrum antibiotics for HCAP and was weaned off pressors. He has recurrent drop in H/H on 07/20 requiring one unit PRBC as well as vasopressors. He was started on midodrine. Repeat CT abdomen on 07/21 showed hematoma resolving. Hospital course significant for persistent hypotension --required CRRT and stress dose steroids, episode of NSVT, recurrent N/V concerning for ileus, blood output from NGT, persistent leucocytosis with recurrent fevers. BLE dopplers were negative for DVT. He defervesced without antibiotics and Right IJ tunneled catheter and left forearm graft placed on 07/31 by Dr. Donnetta Hutching. He developed A fib with RVR on 07/31 and was started on low dose BB.Cardiology consulted on 08/02 and felt that patient at high risk for [facility] and heart rate controlled but BP  remain soft and may need to hold BB on dialysis days. Patient transferred to CIR on 06/10/2020 .    Patient currently requires Min to Mod A with basic self-care skills secondary to muscle weakness and decreased cardiorespiratoy endurance.  Prior to hospitalization, patient could complete BADLs/IADLs with independence.  Patient will benefit from  skilled intervention to decrease level of assist with basic self-care skills and increase independence with basic self-care skills prior to discharge home with care partner.  Anticipate patient will require 24 hour supervision and follow up home health.  OT - End of Session Activity Tolerance: Tolerates < 10 min activity, no significant change in vital signs Endurance Deficit: Yes Endurance Deficit Description: Decreased activity tolerance OT Assessment Rehab Potential (ACUTE ONLY): Good OT Patient demonstrates impairments in the following area(s): Balance;Endurance;Safety OT Basic ADL's Functional Problem(s): Grooming;Bathing;Dressing;Toileting OT Transfers Functional Problem(s): Toilet;Tub/Shower OT Plan OT Intensity: Minimum of 1-2 x/day, 45 to 90 minutes OT Frequency: 5 out of 7 days OT Duration/Estimated Length of Stay: 7-10 days OT Treatment/Interventions: [employer] reintegration;Discharge planning;DME/adaptive equipment instruction;Balance/vestibular training;Functional mobility training;Patient/family education;Self Care/advanced ADL retraining;Therapeutic Activities;Therapeutic Exercise;UE/LE Coordination activities OT Self Feeding Anticipated Outcome(s): Independent OT Basic Self-Care Anticipated Outcome(s): Supervision A OT Toileting Anticipated Outcome(s): Supervision A OT Bathroom Transfers Anticipated Outcome(s): Supervision A OT Recommendation Patient destination: Home Follow Up Recommendations: Home health OT Equipment Recommended: Other (comment) Equipment Details: TBD   OT Evaluation Precautions/Restrictions  Precautions Precautions: Fall Restrictions Weight Bearing Restrictions: No General Chart Reviewed: Yes Family/Caregiver Present: No Vital Signs Therapy Vitals Temp: 98.3 F (36.8 C) Pulse Rate: 80 Resp: 19 BP: (!) 92/51 Patient Position (if appropriate): Sitting Oxygen Therapy SpO2: 100 % O2 Device: Room Air Pain Pain Assessment Pain Scale:  0-10 Pain Score: 0-No pain Home Living/Prior Functioning Home Living Family/patient expects to be discharged to:: Private residence Living Arrangements: Spouse/significant other, Children Available Help at Discharge: Family, Available 24 hours/day Type of Home: House Home Access: Stairs to enter Technical brewer of Steps: 1 Entrance Stairs-Rails: None Home Layout: One level Bathroom Shower/Tub: Tub/shower unit, Architectural technologist: Handicapped height Bathroom Accessibility: Yes  Lives With: Spouse IADL History Homemaking Responsibilities: Yes Current License: Yes Mode of Transportation: Car Occupation: Retired Type of Occupation: Quarry manager Leisure and Hobbies: Playing basketball, walking (10 miles daily) IADL Comments: Independent with meal prep and homemaking tasks (share responsibility w/ wife), and driving. Prior Function Level of Independence: Independent with basic ADLs, Independent with homemaking with ambulation  Able to Take Stairs?: Yes Driving: Yes Vocation: Retired Vision Baseline Vision/History: Wears glasses Wears Glasses: At all times Patient Visual Report: No change from baseline Vision Assessment?: Vision impaired- to be further tested in functional context Perception    Praxis   Cognition Overall Cognitive Status: Within Functional Limits for tasks assessed Arousal/Alertness: Awake/alert Orientation Level: Person;Place;Situation Person: Oriented Place: Oriented Situation: Oriented Year: 2021 Month: August Day of Week: Correct Memory: Appears intact Immediate Memory Recall: Sock;Blue;Bed Memory Recall Sock: Without Cue Memory Recall Blue: Without Cue Memory Recall Bed: With Cue Awareness: Appears intact Problem Solving: Appears intact Safety/Judgment: Appears intact Sensation Sensation Light Touch: Appears Intact Coordination Gross Motor Movements are Fluid and Coordinated: No Fine Motor Movements are Fluid and Coordinated:  No Finger Nose Finger Test: Impaired bilaterally Motor  Motor Motor: Within Functional Limits  Trunk/Postural Assessment  Cervical Assessment Cervical Assessment: Exceptions to [facility] (Forward head) Thoracic Assessment Thoracic Assessment: Exceptions to WFL (Kyphotic) Postural Control Postural Control: Deficits on evaluation Righting Reactions: Delayed  Balance Balance Balance Assessed: Yes  Static Sitting Balance Static Sitting - Balance Support: Feet supported Static Sitting - Level of Assistance: 7: Independent Dynamic Sitting Balance Dynamic Sitting - Balance Support: Feet supported Dynamic Sitting - Level of Assistance: 5: Stand by assistance Dynamic Sitting - Balance Activities: Other (comment) Sitting balance - Comments: During BADLs in unsupported sitting at sink level. Static Standing Balance Static Standing - Balance Support: Bilateral upper extremity supported Static Standing - Level of Assistance: 4: Min assist Dynamic Standing Balance Dynamic Standing - Balance Support: Bilateral upper extremity supported Dynamic Standing - Level of Assistance: 4: Min assist Extremity/Trunk Assessment RUE Assessment RUE Assessment: Exceptions to [facility] Passive Range of Motion (PROM) Comments: WFL Active Range of Motion (AROM) Comments: WFL General Strength Comments: 4-/5 grossly LUE Assessment LUE Assessment: Exceptions to [facility] Passive Range of Motion (PROM) Comments: WFL Active Range of Motion (AROM) Comments: WFL General Strength Comments: 4-/5 grossly  Care Tool Care Tool Self Care Eating   Eating Assist Level: Set up assist    Oral Care    Oral Care Assist Level: Set up assist    Bathing   Body parts bathed by patient: Right arm;Left arm;Abdomen;Chest;Front perineal area;Face;Left upper leg;Right upper leg Body parts bathed by helper: Buttocks;Right lower leg;Left lower leg   Assist Level: Moderate Assistance - Patient 50 - 74%    Upper Body Dressing(including  orthotics)   What is the patient wearing?: Pull over shirt   Assist Level: Minimal Assistance - Patient > 75%    Lower Body Dressing (excluding footwear)   What is the patient wearing?: Pants;Underwear/pull up Assist for lower body dressing: Moderate Assistance - Patient 50 - 74%    Putting on/Taking off footwear   What is the patient wearing?: Ted hose;Non-skid slipper socks Assist for footwear: Moderate Assistance - Patient 50 - 74%       Care Tool Toileting Toileting activity         Care Tool Bed Mobility Roll left and right activity        Sit to lying activity        Lying to sitting edge of bed activity         Care Tool Transfers Sit to stand transfer   Sit to stand assist level: Minimal Assistance - Patient > 75%    Chair/bed transfer   Chair/bed transfer assist level: Minimal Assistance - Patient > 75%     Toilet transfer   Assist Level: Minimal Assistance - Patient > 75%     Care Tool Cognition Expression of Ideas and Wants Expression of Ideas and Wants: Without difficulty (complex and basic) - expresses complex messages without difficulty and with speech that is clear and easy to understand   Understanding Verbal and Non-Verbal Content Understanding Verbal and Non-Verbal Content: Understands (complex and basic) - clear comprehension without cues or repetitions   Memory/Recall Ability *first 3 days only Memory/Recall Ability *first 3 days only: Current season;That he or she is in a hospital/hospital unit    Refer to Care Plan for [place] 1 OT Short Term Goal 1 (Week 1): STG = LTG  Recommendations for other services: Other: TBD   Skilled Therapeutic Intervention Patient met seated in wc. No c/o pain at rest or with activity although patient very fatigued from activities of the day. Patient c/o dizziness with BP of 89/41mHg. Thigh-high TED hose applied with BP increasing to 98/51mg. SpO2 >98% throughout tx. session and  HR between 75-80 at rest and with  light activity. OT provided patient education on purpose of rehab, establishment of goals, and ELOS with patient in agreement. Patient completed bathing/dressing seated in wc at sink level with Min A for UB and Mod A for LB. Patient limited by decreased activity tolerance, need for extended rest breaks, decreased dynamic standing balance, and generalized weakness and would benefit from CIR placement to maximize safety and independence with self-care tasks in prep for safe d/c home. Daughter and wife entered room at conclusion of session with OT providing education on process of rehab and confirming home set-up and available DME. Session concluded with patient seated in recliner with call bell within reach, chair alarm activated, and all needs met.   ADL ADL Eating: Set up Grooming: Setup Where Assessed-Grooming: Sitting at sink Upper Body Bathing: Minimal assistance Where Assessed-Upper Body Bathing: Sitting at sink Lower Body Bathing: Moderate assistance Where Assessed-Lower Body Bathing: Sitting at sink Upper Body Dressing: Minimal assistance Where Assessed-Upper Body Dressing: Sitting at sink Lower Body Dressing: Moderate assistance Where Assessed-Lower Body Dressing: Sitting at sink;Standing at sink Toilet Transfer: Minimal assistance Toilet Transfer Method: Ambulating Mobility  Transfers Sit to Stand: Minimal Assistance - Patient > 75% Stand to Sit: Minimal Assistance - Patient > 75%   Discharge Criteria: Patient will be discharged from OT if patient refuses treatment 3 consecutive times without medical reason, if treatment goals not met, if there is a change in medical status, if patient makes no progress towards goals or if patient is discharged from hospital.  The above assessment, treatment plan, treatment alternatives and goals were discussed and mutually agreed upon: by patient  Martavia Tye R [place]-Davis 06/11/2020, 1:03 PM

## 2020-06-11 NOTE — Progress Notes (Signed)
Inpatient Rehabilitation  Patient information reviewed and entered into eRehab system by Arlon Bleier M. Deryl Giroux, M.A., CCC/SLP, PPS Coordinator.  Information including medical coding, functional ability and quality indicators will be reviewed and updated through discharge.    

## 2020-06-11 NOTE — Progress Notes (Signed)
Patient Details  Name: Todd Robertson MRN: [id number] Date of Birth: August 28, 1946  Today's Date: 06/11/2020  Hospital Problems: Principal Problem:   Physical debility  Past Medical History:  Past Medical History:  Diagnosis Date  . Arthritis   . Cancer [facility])    prostate  . Cardiomyopathy    secondary  . CKD (chronic kidney disease)   . DM2 (diabetes mellitus, type 2) ([place])   . Gout   . HTN (hypertension)    unspec  . Hypercholesterolemia   . Nonischemic cardiomyopathy ([place])    a. EF 40-50% by most recent 2-D echo b. EF 25% by cardiac cath in 2004  . Overweight(278.02)    Past Surgical History:  Past Surgical History:  Procedure Laterality Date  . AV FISTULA PLACEMENT Left 06/06/2020   Procedure: LEFT ARM ARTERIOVENOUS (AV) GRAFT USING 45CM GORTEX;  Surgeon: [name], MD;  Location: [place];  Service: Vascular;  Laterality: Left;  . COLONOSCOPY    . HERNIA REPAIR    . INSERTION OF DIALYSIS CATHETER Right 06/06/2020   Procedure: INSERTION OF DIALYSIS CATHETER USING 23CM DOUBLE LUMEN CATHETER;  Surgeon: [name], MD;  Location: [place];  Service: Vascular;  Laterality: Right;  . IR FLUORO GUIDE CV LINE RIGHT  05/20/2020  . IR US GUIDE VASC ACCESS RIGHT  05/20/2020  . KNEE ARTHROPLASTY Left 08/08/2017   Procedure: LEFT TOTAL KNEE ARTHROPLASTY WITH COMPUTER NAVIGATION;  Surgeon: Rod Can, MD;  Location: [place];  Service: Orthopedics;  Laterality: Left;  Needs RNFA  . KNEE ARTHROPLASTY Right 11/17/2017   Procedure: RIGHT TOTAL KNEE ARTHROPLASTY WITH COMPUTER NAVIGATION;  Surgeon: Rod Can, MD;  [facility];  Service: Orthopedics;  Laterality: Right;  NEEDS RNFA  . ROBOT ASSISTED LAPAROSCOPIC NEPHRECTOMY Right 05/16/2020   Procedure: XI ROBOTIC ASSISTED LAPAROSCOPIC NEPHRECTOMY;  Surgeon: Cleon Gustin, MD;  [facility];  Service: Urology;  Laterality: Right;  2.5 hrs   Social History:  reports that he quit smoking about 35 years ago. His smoking use  included cigarettes. He started smoking about 54 years ago. He has a 10.00 pack-year smoking history. He has never used smokeless tobacco. He reports that he does not drink alcohol and does not use drugs.  Family / Support Systems Patient Roles: Spouse Children: 2 Daughters ([place] and [place]) Other Supports: Daughters Husband Anticipated Caregiver: Gaffer Ability/Limitations of Caregiver: Back problems Caregiver Availability: 24/7  Social History Preferred language: English Religion: Baptist Cultural Background: Technician Read: Yes Write: Yes Employment Status: Retired   Abuse/Neglect Abuse/Neglect Assessment Can Be Completed: Yes Physical Abuse: Denies Verbal Abuse: Denies Sexual Abuse: Denies Exploitation of patient/patient's resources: Denies Self-Neglect: Denies  Emotional Status Pt's affect, behavior and adjustment status: Wife reports patient more quiet than normal Recent Psychosocial Issues: no Psychiatric History: no Substance Abuse History: no  Patient / Family Perceptions, Expectations & Goals Pt/Family understanding of illness & functional limitations: yes Pt/family expectations/goals: Goal to discharge home with spouse and daughter to assit with care  [employer]: None Premorbid Home Care/DME Agencies: None Transportation available at discharge: family able to [employer]  Discharge Planning Living Arrangements: Spouse/significant other, Children Support Systems: Spouse/significant other, Children Type of Residence: Private residence (1 level home (no steps to enter front, no steps back door)) Insurance Resources: Medicare Living Expenses: Own Does the patient have any problems obtaining your medications?: No Care Coordinator Anticipated Follow Up Needs: HH/OP Expected length of stay: 5-7 Days  Clinical Impression Sw entered room  spouse and daughter at bedside. Introduced self, explained role and process. Addressed  questions and concerns, sw will continue to follow up.   Dyanne Iha 06/11/2020, 1:36 PM

## 2020-06-11 NOTE — Progress Notes (Signed)
Inpatient Center Individual Statement of Services  Patient Name:  Todd Robertson  Date:  06/11/2020  Welcome to the [facility].  Our goal is to provide you with an individualized program based on your diagnosis and situation, designed to meet your specific needs.  With this comprehensive rehabilitation program, you will be expected to participate in at least 3 hours of rehabilitation therapies Monday-Friday, with modified therapy programming on the weekends.  Your rehabilitation program will include the following services:  Physical Therapy (PT), Occupational Therapy (OT), Speech Therapy (ST), 24 hour per day rehabilitation nursing, Therapeutic Recreaction (TR), Neuropsychology, Care Coordinator, Rehabilitation Medicine, Nutrition Services, Pharmacy Services and Other  Weekly team conferences will be held on Wednesdays to discuss your progress.  Your Inpatient Rehabilitation Care Coordinator will talk with you frequently to get your input and to update you on team discussions.  Team conferences with you and your family in attendance may also be held.  Expected length of stay: 5-7 Days  Overall anticipated outcome: MOD I  Depending on your progress and recovery, your program may change. Your Inpatient Rehabilitation Care Coordinator will coordinate services and will keep you informed of any changes. Your Inpatient Rehabilitation Care Coordinator's name and contact numbers are listed  below.  The following services may also be recommended but are not provided by the [facility]:    [name] will be made to provide these services after discharge if needed.  Arrangements include referral to agencies that provide these services.  Your insurance has been verified to be:  Medicare Your primary doctor is:  Monico Blitz, MD  Pertinent information will be shared  with your doctor and your insurance company.  Inpatient Rehabilitation Care Coordinator:  Erlene Quan, [place] or [phone]  Information discussed with and copy given to patient by: Dyanne Iha, 06/11/2020, 9:13 AM

## 2020-06-11 NOTE — Patient Care Conference (Signed)
Inpatient RehabilitationTeam Conference and Plan of Care Update Date: 06/11/2020   Time: 10:14    Patient Name: Todd Robertson      Medical Record Number: [id number]  Date of Birth: 11-Mar-1946 Sex: Male         Room/Bed: 4M11C/4M11C-01 Payor Info: Payor: MEDICARE / Plan: MEDICARE PART A AND B / Product Type: *No Product type* /    Admit Date/Time:  06/10/2020  7:50 PM  Primary Diagnosis:  Physical debility  Hospital Problems: Principal Problem:   Physical debility    Expected Discharge Date: Expected Discharge Date:  (Evals pending)  Team Members Present: Physician leading conference: Dr. Alysia Penna Care Coodinator Present: Dorien Chihuahua, RN, BSN, CRRN;Christina Sampson Goon, [place] Nurse Present: Other (comment) Renda Rolls , LPN) PT Present: Barrie Folk, PT OT Present: Turner Daniels, OT SLP Present: Jettie Booze, CF-SLP;Other (comment) Nadara Mode, SLP) [place] Coordinator present : Gunnar Fusi, Novella Olive, PT     Current Status/Progress Goal Weekly Team Focus  Bowel/Bladder   Continent of bowel and bladder. LBM is 06/11/2020.  To remain continent.  Assess tolieting needs q2 or as needed.   Swallow/Nutrition/ Hydration             ADL's             Mobility   Eval pending  Eval pending      Communication             Safety/Cognition/ Behavioral Observations            Pain   No complaints of pain.  To remain pain free.  Assess pain q shift or prn.   Skin   Has small open area to right buttock (assessed by wound RN as documented.) It is covered with a foam. 4 Surgical incisions to right side of abdomen OTA with skin glue for closure. Also surgical incision to the right pelvis area with skin glue. Ecchymosis to bilateral hands and right hip. Has old skin tear to right hand that has scabbed over.  To prevent further breakdown, promote healing.  Assess skin q shift.     Discharge Planning:    TBD  Team Discussion: Encephalopathy resolved,  nausea intermittent. HD T, TH, S rotation PTA Patient on target to meet rehab goals: Yes; initial evals incomplete  *See Care Plan and progress notes for long and short-term goals.   Revisions to Treatment Plan:    Teaching Needs: TBD  Current Barriers to Discharge: Hemodialysis  Possible Resolutions to Barriers:      Medical Summary Current Status: On HD, +dyspepsia,  Barriers to Discharge: Hemodialysis;Medical stability  Barriers to Discharge Comments: new HD Possible Resolutions to Barriers/Weekly Focus: Cont rehab, using diatek cath   Continued Need for Acute Rehabilitation Level of Care: The patient requires daily medical management by a physician with specialized training in physical medicine and rehabilitation for the following reasons: Direction of a multidisciplinary physical rehabilitation program to maximize functional independence : Yes Medical management of patient stability for increased activity during participation in an intensive rehabilitation regime.: Yes Analysis of laboratory values and/or radiology reports with any subsequent need for medication adjustment and/or medical intervention. : Yes   I attest that I was present, lead the team conference, and concur with the assessment and plan of the team.   Dorien Chihuahua B 06/11/2020, 1:25 PM

## 2020-06-11 NOTE — Progress Notes (Signed)
Meredith Staggers, MD  Physician  Physical Medicine and Rehabilitation  PMR Pre-admission    Signed  Date of Service:  06/02/2020 11:02 AM      Related encounter: Admission (Discharged) from 05/16/2020 in [facility] 4E CV SURGICAL PROGRESSIVE CARE      Signed       Show:Clear all _0 Manual_1 Template_2 Copied  Added by: _3 Raechel Ache, OT_4 Retta Diones, RN_5 Meredith Staggers, MD  _6 Hover for details PMR Admission Coordinator Pre-Admission Assessment  Patient: Todd Robertson is an 74 y.o., male MRN: [id number] DOB: 03/11/1946 Height: _7  (188 cm) Weight: 109.5 kg  Insurance Information HMO:     PPO:      PCP:      IPA:      80/20: yes     OTHER:  PRIMARY: Medicare A and B      Policy#: [id number]      Subscriber: patient CM Name:       Phone#:      Fax#:  Pre-Cert#:       Employer:  Benefits:  Phone #: online     Name: verified eligibility online via [place] on 06/02/20 Eff. Date: Part A and B effective 02/06/2013     Deduct: $1,484      Out of Pocket Max: NA      Life Max: NA CIR: Covered per Medicare guidelines once yearly deductible has been met      SNF: days 1-20, 100%, days 21-100, 80% Outpatient: 80%     Co-Pay: 20% Home Health: 100%      Co-Pay:  DME: 80%     Co-Pay: 20% Providers: Pt's choice SECONDARY: AARP      Policy#: [id number]     Phone#: [phone]  Financial Counselor:       Phone#:   The "Data Collection Information Summary" for patients in Inpatient Rehabilitation Facilities with attached "Privacy Act [place] Records" was provided and verbally reviewed with: Patient and Family  Emergency Contact Information         Contact Information    Name Relation Home Work Mobile   [place] Spouse [phone]  [place] Daughter   [phone]   [place], Dudzinski Sister   [phone]      Current Medical History  Patient Admitting Diagnosis: Debility from multiple medical complications    History of Present Illness: Pt is a 74 yo male with history of right renal mass, prostate cancer, OA, cardiomyopathy, CKD, DM2, gout, HTN, and asthma. Pt presented to the hospital for an elective right nephrectomy on 05/16/20. Post op, pt was noted to have increased abdominal swelling and progressively lowering BP. CT noted a large volume right retroperitoneal hemorrhage with heterogeneous air. PCCM was consulted to assess for circulatory shock and concern about hemodynamics. Pt also developed AKI with poor urine output. Unfortunately, the patient also experienced new emesis POD2 and gaseous distentsion, with improvement after suppository. On 7/13 pt with worsening mentation and concerns for airway protection; he was then intubated for respiratory failure. Pt also noted to be febrile with CXR showing RLL consolidation, diffuse opacities.Pt was started on CRRT on 7/14 in the setting of septic shock. Pt was extubated 7/17 and transitioned off CRRT, only to be placed back on on 7/20. Pt continued with uremic encephalopathy/ICU delirium and struggled with ileus, needing NGT for decompression. Pt was transitioned off CRRT and iHD initiated. Pt was seen by therapies with recommendation for CIR due to functional decline. Pt is to admit to CIR on 08/03/212.  Patient's medical record from [facility] has been reviewed by the rehabilitation admission coordinator and physician.  Past Medical History      Past Medical History:  Diagnosis Date  . Arthritis   . Cancer [facility])    prostate  . Cardiomyopathy    secondary  . CKD (chronic kidney disease)   . DM2 (diabetes mellitus, type 2) ([place])   . Gout   . HTN (hypertension)    unspec  . Hypercholesterolemia   . Nonischemic cardiomyopathy ([place])    a. EF 40-50% by most recent 2-D echo b. EF 25% by cardiac cath in 2004  . Overweight(278.02)     Family History   family history includes Heart disease in his father; Hypertension  in his mother; Kidney disease in his mother and another family member.  Prior Rehab/Hospitalizations Has the patient had prior rehab or hospitalizations prior to admission? No  Has the patient had major surgery during 100 days prior to admission? Yes             Current Medications  Current Facility-Administered Medications:  .  0.9 %  sodium chloride infusion, , Intravenous, PRN, Ulyses Amor, PA-C, Stopped at 05/27/20 [phone] .  0.9 %  sodium chloride infusion, 250 mL, Intravenous, Continuous, Collins, Emma M, PA-C, New Bag at 06/06/20 1330 .  0.9 %  sodium chloride infusion, 100 mL, Intravenous, PRN, Theda Sers, Emma M, PA-C .  0.9 %  sodium chloride infusion, 100 mL, Intravenous, PRN, Theda Sers, Emma M, PA-C .  acetaminophen (TYLENOL) tablet 650 mg, 650 mg, Oral, Q6H PRN, 650 mg at 06/09/20 1121 **OR** acetaminophen (TYLENOL) suppository 650 mg, 650 mg, Rectal, Q4H PRN, Theda Sers, Emma M, PA-C .  arformoterol (BROVANA) nebulizer solution 15 mcg, 15 mcg, Nebulization, BID, Laurence Slate M, [place], 15 mcg at 06/10/20 9604 .  budesonide (PULMICORT) nebulizer solution 0.5 mg, 0.5 mg, Nebulization, BID, Collins, Emma M, PA-C, 0.5 mg at 06/10/20 5409 .  chlorhexidine gluconate (MEDLINE KIT) (PERIDEX) 0.12 % solution 15 mL, 15 mL, Mouth Rinse, BID, Collins, Emma M, PA-C, 15 mL at 06/09/20 2141 .  Chlorhexidine Gluconate Cloth 2 % PADS 6 each, 6 each, Topical, Q0600, Corliss Parish, MD, 6 each at 06/10/20 0555 .  Darbepoetin Alfa (ARANESP) injection 200 mcg, 200 mcg, Subcutaneous, Q Sat-1800, Collins, Emma M, PA-C, 200 mcg at 06/07/20 1216 .  diphenhydrAMINE (BENADRYL) injection 12.5 mg, 12.5 mg, Intravenous, Q6H PRN **OR** diphenhydrAMINE (BENADRYL) 12.5 MG/5ML elixir 12.5 mg, 12.5 mg, Per Tube, Q6H PRN, Theda Sers, Emma M, PA-C .  feeding supplement (PROSource TF) liquid 90 mL, 90 mL, Per Tube, BID, Laurence Slate M, PA-C, 90 mL at 06/09/20 1138 .  feeding supplement (VITAL 1.5 CAL) liquid 1,190 mL,  1,190 mL, Per Tube, Continuous, Collins, Emma M, PA-C, Stopped at 06/07/20 0654 .  heparin injection 1,000 Units, 1,000 Units, Dialysis, PRN, Laurence Slate M, PA-C .  heparin injection 1,000-6,000 Units, 1,000-6,000 Units, CRRT, PRN, Ulyses Amor, PA-C, 3,000 Units at 06/02/20 0830 .  hydrocortisone (CORTEF) tablet 10 mg, 10 mg, Oral, BID WC, Pokhrel, Laxman, MD, 10 mg at 06/10/20 0827 .  insulin aspart (novoLOG) injection 0-9 Units, 0-9 Units, Subcutaneous, TID AC & HS, Pokhrel, Laxman, MD, 1 Units at 06/09/20 2144 .  insulin aspart (novoLOG) injection 2 Units, 2 Units, Subcutaneous, Q4H, Pokhrel, Laxman, MD, 2 Units at 06/09/20 2143 .  insulin detemir (LEVEMIR) injection 14 Units, 14 Units, Subcutaneous, QHS, Pokhrel, Laxman, MD, 14 Units at 06/09/20 2145 .  levalbuterol (XOPENEX) nebulizer solution 0.63 mg, 0.63 mg, Nebulization, Q8H PRN, Pokhrel, Laxman, MD .  lidocaine (XYLOCAINE) 2 % viscous mouth solution 15 mL, 15 mL, Mouth/Throat, Q6H PRN, Ulyses Amor, PA-C, 15 mL at 06/10/20 0933 .  loperamide HCl (IMODIUM) 1 MG/7.5ML suspension 2 mg, 2 mg, Per Tube, PRN, Ulyses Amor, PA-C, 2 mg at 06/03/20 0816 .  MEDLINE mouth rinse, 15 mL, Mouth Rinse, q12n4p, Laurence Slate M, PA-C, 15 mL at 06/09/20 1702 .  menthol-cetylpyridinium (CEPACOL) lozenge 3 mg, 1 lozenge, Oral, PRN, Ulyses Amor, PA-C, 3 mg at 05/19/20 1000 .  metoprolol tartrate (LOPRESSOR) injection 2.5 mg, 2.5 mg, Intravenous, Q6H PRN, Pokhrel, Laxman, MD .  metoprolol tartrate (LOPRESSOR) tablet 12.5 mg, 12.5 mg, Oral, BID, Pokhrel, Laxman, MD, 12.5 mg at 06/09/20 2147 .  midodrine (PROAMATINE) tablet 5 mg, 5 mg, Oral, TID WC, Pokhrel, Laxman, MD, 5 mg at 06/10/20 0826 .  ondansetron (ZOFRAN) injection 4 mg, 4 mg, Intravenous, Q6H PRN, Ulyses Amor, PA-C, 4 mg at 06/07/20 0016 .  phenol (CHLORASEPTIC) mouth spray 1 spray, 1 spray, Mouth/Throat, PRN, Ulyses Amor, PA-C, 1 spray at 06/03/20 1122 .  promethazine  (PHENERGAN) injection 12.5 mg, 12.5 mg, Intravenous, Q6H PRN, Laurence Slate M, PA-C, 12.5 mg at 05/18/20 0630 .  [employer], , Oral, PRN, Ulyses Amor, PA-C .  sodium chloride flush (NS) 0.9 % injection 10-40 mL, 10-40 mL, Intracatheter, Q12H, Collins, Emma M, PA-C, 10 mL at 06/09/20 2147 .  sodium chloride flush (NS) 0.9 % injection 10-40 mL, 10-40 mL, Intracatheter, PRN, Ulyses Amor, PA-C, 10 mL at 06/01/20 1235 .  tiZANidine (ZANAFLEX) tablet 4 mg, 4 mg, Per Tube, Q8H PRN, Ulyses Amor, PA-C  Patients Current Diet:     Diet Order             Diet heart healthy/carb modified Room service appropriate? Yes; Fluid consistency: Thin  Diet effective now                  Precautions / Restrictions Precautions Precautions: Fall Precaution Comments: watch vitals, NG tube, cortrak Restrictions Weight Bearing Restrictions: No   Has the patient had 2 or more falls or a fall with injury in the past year? No  Prior Activity Level Community (5-7x/wk): retired, driving PTA, Independent PTA.   Prior Functional Level Self Care: Did the patient need help bathing, dressing, using the toilet or eating? Independent  Indoor Mobility: Did the patient need assistance with walking from room to room (with or without device)? Independent  Stairs: Did the patient need assistance with internal or external stairs (with or without device)? Independent  Functional Cognition: Did the patient need help planning regular tasks such as shopping or remembering to take medications? Independent  Home Assistive Devices / [place] Devices/Equipment: Eyeglasses Home Equipment: Walker - 2 wheels, Bedside commode  Prior Device Use: Indicate devices/aids used by the patient prior to current illness, exacerbation or injury? None of the above  Current Functional Level Cognition  Overall Cognitive Status: Within Functional Limits for tasks  assessed Orientation Level: Oriented X4 General Comments: requires encouragement to engage with therapist as he is initially very limited in verbalizations, appears [facility] for basic tasks today     Extremity Assessment (includes Sensation/Coordination)  Upper Extremity Assessment: Generalized weakness RUE Deficits / Details: decreased shoulder ROM (flexion/abduction/horizontal adduction) grip WNL RUE Sensation: WNL RUE Coordination: decreased gross motor LUE Deficits / Details: decreased shoulder AROM in  all directions LUE Sensation: WNL LUE Coordination: decreased gross motor  Lower Extremity Assessment: Generalized weakness    ADLs  Overall ADL's : Needs assistance/impaired Eating/Feeding: NPO Grooming: Wash/dry hands, Set up, Sitting Grooming Details (indicate cue type and reason): for support at elbow  Upper Body Bathing: Minimal assistance, Sitting, Bed level Upper Body Bathing Details (indicate cue type and reason): minA for support at elbow for shoulder ROM to access armpits for washing and donning deodorant Lower Body Bathing: Total assistance Lower Body Bathing Details (indicate cue type and reason): peri care during session due to void.  Upper Body Dressing : Maximal assistance, Sitting Lower Body Dressing: Maximal assistance Lower Body Dressing Details (indicate cue type and reason): modA+2 to stand  Toilet Transfer: Minimal assistance, +2 for safety/equipment, Ambulation, RW, Grab bars, Regular Toilet Toilet Transfer Details (indicate cue type and reason): mod-max A for sit <> stand. Pt limited by dizziness and nausea with positional changes Toileting- Clothing Manipulation and Hygiene: Maximal assistance, +2 for safety/equipment, Sit to/from stand Toileting - Clothing Manipulation Details (indicate cue type and reason): assist for pericare in standing after BM Functional mobility during ADLs: Minimal assistance, +2 for safety/equipment, Rolling walker General ADL  Comments: pt on bed pan that patient reports is extended time but had not called RN staff to help off pan. Only when therapist asked did patient indicate need to get off pan. Pt noted to have diarrhea void at this time with light brown color. Pt with skin break down at sacrum    Mobility  Overal bed mobility: Needs Assistance Bed Mobility: Supine to Sit Rolling: Min assist Sidelying to sit: Mod assist, +2 for safety/equipment, HOB elevated Supine to sit: Min assist, HOB elevated Sit to supine: Min guard General bed mobility comments: Assist to elevate trunk into sitting    Transfers  Overall transfer level: Needs assistance Equipment used: Rolling walker (2 wheeled) Transfers: Sit to/from Stand, [employer] Transfers Sit to Stand: From elevated surface, Min assist Stand pivot transfers: Min assist  Lateral/Scoot Transfers: +2 physical assistance, Mod assist General transfer comment: Assist to bring hips up and for balance. Verbal cues for hand placement.    Ambulation / Gait / Stairs / Wheelchair Mobility  Ambulation/Gait Ambulation/Gait assistance: Herbalist (Feet): 45 Feet (15' x 1, 30' x 1, 45' x 1) Assistive device: Rolling walker (2 wheeled) Gait Pattern/deviations: Step-through pattern, Decreased stride length, Trunk flexed General Gait Details: Assist for balance and support. Pt fatigued quickly. Brought rollator behind pt for pt to sit for frequent rest breaks. Gait velocity: decr Gait velocity interpretation: <1.8 ft/sec, indicate of risk for recurrent falls    Posture / Balance Dynamic Sitting Balance Sitting balance - Comments: Pt tolerated sitting EOB x 5-10 minutes Balance Overall balance assessment: Needs assistance Sitting-balance support: Feet supported Sitting balance-Leahy Scale: Fair Sitting balance - Comments: Pt tolerated sitting EOB x 5-10 minutes Standing balance support: Bilateral upper extremity supported Standing balance-Leahy  Scale: Poor Standing balance comment: walker and min guard for static standing    Special needs/care consideration Continuous Drip IV  N/A  Dialysis: Hemodialysis Tuesday, Thursday and Saturday (clip in progress with [place]-HD coordinator)  Oxygen: on RA  Skin: ecchymosis to abdomen, flank (right; lateral), MASD to coccyx, buttocks (mid), skin tear to right hand, abdominal incision, right chest incision, incision to back (right), incisions for ports (abdomen 1. right, lateral, mid; 2. Right, lateral, upper; 3. Right lateral medial; 4. Right, lataral, lower); wound puncture neck right venous access  puncture site -right IJ   Diabetic management: yes    and Designated visitor: [name] and [place] (from acute therapy documentation) Living Arrangements: Spouse/significant other Available Help at Discharge: Family, Available 24 hours/day Type of Home: House Home Layout: One level Home Access: Stairs to enter [employer] of Steps: 1 Bathroom Shower/Tub: Public librarian, Architectural technologist: Handicapped height Bathroom Accessibility: Yes (to RW, not w/c) [place]: No  Discharge Living Setting Plans for Discharge Living Setting: Patient's home, Lives with (comment) (lives with wife) Type of Home at Discharge: House Discharge Home Layout: One level Discharge Home Access: Stairs to enter Entrance Stairs-Rails: None Entrance Stairs-Number of Steps: 1 Discharge Bathroom Shower/Tub: Tub/shower unit Discharge Bathroom Toilet: Handicapped height Discharge Bathroom Accessibility: Yes How Accessible: Accessible via walker Does the patient have any problems obtaining your medications?: No  Social/Family/Support Systems Patient Roles: Spouse Contact Information: [name] (wife): [phone] Anticipated Caregiver: wife + daughter can assist as well  Anticipated Caregiver's Contact Information: see above Ability/Limitations of  Caregiver: Min A Caregiver Availability: 24/7 Discharge Plan Discussed with Primary Caregiver: Yes (pt, his wife, and daughter) Is Caregiver In Agreement with Plan?: Yes Does Caregiver/Family have Issues with Lodging/Transportation while Pt is in Rehab?: No  Goals Patient/Family Goal for Rehab: PT/OT: Mod I; SLP: NA Expected length of stay: 5-7 days Cultural Considerations: NA Pt/Family Agrees to Admission and willing to participate: Yes Program Orientation Provided & Reviewed with Pt/Caregiver Including Roles  & Responsibilities: Yes (pt, his wife, and daughter)  Barriers to Discharge: Medical stability, Home environment access/layout, Hemodialysis, Nutrition means  Barriers to Discharge Comments: New HD-pt may need to have cortrack out prior to a bed offer for outpatient HD per HD coordinator [place].   Decrease burden of Care through IP rehab admission: NA  Possible need for SNF placement upon discharge: Not anticipated; pt has good social support from his family and his house appears to be accessible to a RW with minimal steps to enter. Anticipate pt can reach a Min A level to return home safely with family support.   Patient Condition: I have reviewed medical records from [facility], spoken with MD, and patient, spouse and daughter. I met with patient at the bedside for inpatient rehabilitation assessment.  Patient will benefit from ongoing PT, OT and SLP, can actively participate in 3 hours of therapy a day 5 days of the week, and can make measurable gains during the admission.  Patient will also benefit from the coordinated team approach during an Inpatient Acute Rehabilitation admission.  The patient will receive intensive therapy as well as Rehabilitation physician, nursing, social worker, and care management interventions.  Due to safety, skin/wound care, disease management, medication administration, pain management and patient education the patient requires 24  hour a day rehabilitation nursing.  The patient is currently Min G with mobility and Min A +2 for basic ADLs.  Discharge setting and therapy post discharge at home with home health is anticipated.  Patient has agreed to participate in the Acute Inpatient Rehabilitation Program and will admit today.  Preadmission Screen Completed By:  Retta Diones, 06/10/2020 10:38 AM ______________________________________________________________________   Discussed status with Dr. Naaman Plummer on 06/10/20 at 1042 and received approval for admission today.  Admission Coordinator:  Retta Diones, RN, time 1042/Date 06/10/20   Assessment/Plan: Diagnosis: debility after multiple medical complications 1. Does the need for close, 24 hr/day Medical supervision in concert with the patient's rehab needs make it unreasonable  for this patient to be served in a less intensive setting? Yes 2. Co-Morbidities requiring supervision/potential complications: right renal mass/prostate CA, retroperitoneal hemorrhage, RLL pneumonia, encephalopathy, CKD, DM, gout 3. Due to bladder management, bowel management, safety, skin/wound care, disease management, medication administration, pain management and patient education, does the patient require 24 hr/day rehab nursing? Yes 4. Does the patient require coordinated care of a physician, rehab nurse, PT, OT, and SLP to address physical and functional deficits in the context of the above medical diagnosis(es)? Yes Addressing deficits in the following areas: balance, endurance, locomotion, strength, transferring, bowel/bladder control, bathing, dressing, feeding, grooming, toileting, cognition and psychosocial support 5. Can the patient actively participate in an intensive therapy program of at least 3 hrs of therapy 5 days a week? Yes 6. The potential for patient to make measurable gains while on inpatient rehab is excellent 7. Anticipated functional outcomes upon discharge from inpatient rehab:  modified independent PT, modified independent OT, n/a SLP 8. Estimated rehab length of stay to reach the above functional goals is: 5-7 days 9. Anticipated discharge destination: Home 10. Overall Rehab/Functional Prognosis: excellent   MD Signature: Meredith Staggers, MD, [place] [facility] 06/10/2020         Revision History                                         Note Details  Author Meredith Staggers, MD File Time 06/10/2020 12:36 PM  Author Type Physician Status Signed  Last Editor Meredith Staggers, MD Service Physical Medicine and [place] # [id number] Admit Date 06/10/2020

## 2020-06-11 NOTE — Progress Notes (Signed)
Subjective:  Moved to rehab-  HD yest only removed 550 due to low BPs Objective Vital signs in last 24 hours: Vitals:   06/10/20 2010 06/10/20 2237 06/11/20 0304 06/11/20 0901  BP: 107/63  107/61   Pulse: (!) 106  80   Resp: 20  20   Temp: 98.4 F (36.9 C)  98.9 F (37.2 C)   TempSrc: Oral  Oral   SpO2: 95%  96% 96%  Weight: 109.8 kg  101.1 kg   Height:  6\' 2"  (1.88 m)     Weight change:   Intake/Output Summary (Last 24 hours) at 06/11/2020 1301 Last data filed at 06/11/2020 0830 Gross per 24 hour  Intake 360 ml  Output --  Net 360 ml    Assessment/ Plan: Pt is a 74 y.o. yo male with advanced CKD at baseline who was admitted on 06/10/2020 with need for nephrectomy  Assessment/Plan: 1. RCC-  Need for nephrectomy - done on 7/9- c/b bleeding and progression to ESRD 2. 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  CLIP in process-  Family desires [facility]-  Will be [facility]  on TTS schedule 3. Anemia- hgb in the 7's-  On Darbe 200 weekly and s/p iron repletion 4. Secondary hyperparathyroidism- PTH 116, calc and phos WNL-  No meds 5. HTN/volume-  req midodrine for BP support  Will inc to max dose as well as cortef.  Low dose metoprolol for Afib- min UF with HD 6. Malnutrition-  On supps 7. Dispo-   CIR but hope eventually to home with OP HD [facility]    Labs: Basic Metabolic Panel: Recent Labs  Lab 06/08/20 0241 06/09/20 0123 06/10/20 0307  NA 135 134* 135  K 4.4 5.0 5.1  CL 98 97* 97*  CO2 23 22 21*  GLUCOSE 108* 159* 88  BUN 60* 86* 108*  CREATININE 7.13* 9.68* 11.56*  CALCIUM 8.3* 8.2* 8.1*   Liver Function Tests: Recent Labs  Lab 06/05/20 0529  AST 19  ALT 18  ALKPHOS 66  BILITOT 0.7  PROT 5.1*  ALBUMIN 2.2*   No results for input(s): LIPASE, AMYLASE in the last 168 hours. No results for input(s): AMMONIA in the last 168 hours. CBC: Recent  Labs  Lab 06/06/20 0418 06/06/20 0418 06/07/20 0200 06/07/20 0200 06/08/20 0241 06/09/20 0123 06/10/20 0307  WBC 13.4*   < > 12.3*   < > 10.4 9.7 7.8  HGB 7.7*   < > 8.4*   < > 7.6* 7.9* 8.0*  HCT 24.0*   < > 25.8*   < > 24.6* 24.9* 25.6*  MCV 94.1  --  94.2  --  93.5 93.6 93.4  PLT 300   < > 325   < > 277 321 283   < > = values in this interval not displayed.   Cardiac Enzymes: No results for input(s): CKTOTAL, CKMB, CKMBINDEX, TROPONINI in the last 168 hours. CBG: Recent Labs  Lab 06/10/20 0833 06/10/20 1046 06/10/20 2204 06/11/20 0558 06/11/20 1209  GLUCAP 75  75 117* 107* 102* 119*    Iron Studies: No results for input(s): IRON, TIBC, TRANSFERRIN, FERRITIN in the last 72 hours. Studies/Results: No results found. Medications: Infusions:   Scheduled Medications: . arformoterol  15 mcg Nebulization BID  . budesonide (PULMICORT) nebulizer solution  0.5 mg Nebulization BID  . Chlorhexidine Gluconate Cloth  6 each Topical Q0600  . [START ON 06/14/2020] darbepoetin (ARANESP) injection - NON-DIALYSIS  200 mcg Subcutaneous Q Sat-1800  . hydrocortisone  10 mg Oral BID WC  . insulin aspart  0-5 Units Subcutaneous QHS  . insulin aspart  0-9 Units Subcutaneous TID WC  . insulin detemir  14 Units Subcutaneous QHS  . mouth rinse  15 mL Mouth Rinse q12n4p  . metoprolol tartrate  12.5 mg Oral BID  . midodrine  5 mg Oral TID WC  . multivitamin  1 tablet Oral QHS    have reviewed scheduled and prn medications.  Physical Exam: General: obese, pleasant-  Minimal mobility- NAD Heart: RRR Lungs: mostly clear Abdomen: obese, soft, non tender Extremities: min edema Dialysis Access: left AVG-  thrill - right TDC    06/11/2020,1:01 PM  LOS: 1 day

## 2020-06-11 NOTE — Evaluation (Signed)
Physical Therapy Assessment and Plan  Patient Details  Name: Todd Robertson MRN: [id number] Date of Birth: 1946/01/15  PT Diagnosis: Difficulty walking and Muscle weakness Rehab Potential: Good ELOS: 7-10 days   Today's Date: 06/11/2020 PT Individual Time: 0903-1000 PT Individual Time Calculation (min): 57 min    Hospital Problem: Principal Problem:   Physical debility   Past Medical History:  Past Medical History:  Diagnosis Date  . Arthritis   . Cancer [facility])    prostate  . Cardiomyopathy    secondary  . CKD (chronic kidney disease)   . DM2 (diabetes mellitus, type 2) ([place])   . Gout   . HTN (hypertension)    unspec  . Hypercholesterolemia   . Nonischemic cardiomyopathy ([place])    a. EF 40-50% by most recent 2-D echo b. EF 25% by cardiac cath in 2004  . Overweight(278.02)    Past Surgical History:  Past Surgical History:  Procedure Laterality Date  . AV FISTULA PLACEMENT Left 06/06/2020   Procedure: LEFT ARM ARTERIOVENOUS (AV) GRAFT USING 45CM GORTEX;  Surgeon: [name], MD;  Location: [place];  Service: Vascular;  Laterality: Left;  . COLONOSCOPY    . HERNIA REPAIR    . INSERTION OF DIALYSIS CATHETER Right 06/06/2020   Procedure: INSERTION OF DIALYSIS CATHETER USING 23CM DOUBLE LUMEN CATHETER;  Surgeon: [name], MD;  Location: [place];  Service: Vascular;  Laterality: Right;  . IR FLUORO GUIDE CV LINE RIGHT  05/20/2020  . IR US GUIDE VASC ACCESS RIGHT  05/20/2020  . KNEE ARTHROPLASTY Left 08/08/2017   Procedure: LEFT TOTAL KNEE ARTHROPLASTY WITH COMPUTER NAVIGATION;  Surgeon: Rod Can, MD;  Location: [place];  Service: Orthopedics;  Laterality: Left;  Needs RNFA  . KNEE ARTHROPLASTY Right 11/17/2017   Procedure: RIGHT TOTAL KNEE ARTHROPLASTY WITH COMPUTER NAVIGATION;  Surgeon: Rod Can, MD;  Location: [facility];  Service: Orthopedics;  Laterality: Right;  NEEDS RNFA  . 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 He was admitted on 05/16/20 for right nephrectomy with lysis of adhesis by Dr. Alyson Ingles. Post op course significant for hypotension with acute on chronic renal failure as well as ABLA due to large volume hemorrhage into right nephrectomy bed. He developed volume overload as well as acute metabolic encephalopathy due to uremia requiring CRRT. He developed septic shock requiring pressors and acute respiratory failure requiring with reintubation 07/13- 07/17. He was treated with broad spectrum antibiotics for HCAP and was weaned off pressors . He has recurrent drop in H/H on 07/20 requiring one unit PRBC as well as vasopressors. He was started on midodrine. Repeat CT abdomen on 07/21 showed hematoma resolving.   Hospital course significant for persistent hypotension --required CRRT and stress dose steroids, episode of NSVT, recurrent N/V concerning for ileus, blood output from NGT, persistent leucocytosis with recurrent fevers. BLE dopplers were negative for DVT. He defervesced without antibiotics and Right IJ tunneled catheter and left forearm graft placed on 07/31 by Dr. Donnetta Hutching. He developed A fib with RVR on 07/31 and was started on low dose BB. Cardiology consulted on 08/02 and felt that patient at high risk for [facility] and heart  rate controlled but BP remain soft and may need to hold BB on dialysis days. Stress dose steroids being weaned off and midodrine decreased to 5 mg tid today.  Patient transferred to CIR on 06/10/2020 .   Patient currently requires min with mobility secondary to muscle weakness, decreased cardiorespiratoy endurance,   and decreased sitting balance, decreased standing balance,  decreased postural control and decreased balance strategies.  Prior to hospitalization, patient was independent  with mobility and lived with Spouse in a House home.  Home access is 1Stairs to enter.  Patient will benefit from skilled PT intervention to maximize safe functional mobility, minimize fall risk and decrease caregiver burden for planned discharge home with 24 hour supervision.  Anticipate patient will benefit from follow up [place] at discharge.  PT - End of Session Activity Tolerance: Tolerates 10 - 20 min activity with multiple rests Endurance Deficit: Yes Endurance Deficit Description: Decreased activity tolerance PT Assessment Rehab Potential (ACUTE/IP ONLY): Good PT Barriers to Discharge: Home environment access/layout;Hemodialysis PT Patient demonstrates impairments in the following area(s): Balance;Endurance;Motor;Pain;Safety PT Transfers Functional Problem(s): Bed Mobility;Bed to Chair;Car PT Locomotion Functional Problem(s): Ambulation;Stairs PT Plan PT Intensity: Minimum of 1-2 x/day ,45 to 90 minutes PT Frequency: 5 out of 7 days PT Duration Estimated Length of Stay: 7-10 days PT Treatment/Interventions: Ambulation/gait training;Community reintegration;DME/adaptive equipment instruction;Neuromuscular re-education;Psychosocial support;Stair training;UE/LE Strength taining/ROM;Balance/vestibular training;Discharge planning;Functional electrical stimulation;Wheelchair propulsion/positioning;Pain management;Therapeutic Activities;UE/LE Coordination activities;Cognitive remediation/compensation;Disease management/prevention;Functional mobility training;Patient/family education;Splinting/orthotics;Therapeutic Exercise;Visual/perceptual remediation/compensation PT Recommendation Follow Up Recommendations: 24 hour supervision/assistance;Home health PT Patient destination: Home Equipment Recommended: To be determined   PT  Evaluation Precautions/Restrictions Precautions Precautions: Fall Restrictions Weight Bearing Restrictions: No Home Living/Prior Functioning Home Living Available Help at Discharge: Family;Available 24 hours/day Type of Home: House Home Access: Stairs to enter [employer] of Steps: 1 Entrance Stairs-Rails: None Home Layout: One level  Lives With: Spouse Prior Function Level of Independence: Independent with basic ADLs;Independent with homemaking with ambulation  Able to Take Stairs?: Yes Driving: Yes Vocation: Retired Art gallery manager: Within [place]: Intact  Cognition Overall Cognitive Status: Within Functional Limits for tasks assessed Arousal/Alertness: Awake/alert Orientation Level: Oriented X4 Safety/Judgment: Appears intact Sensation Sensation Light Touch: Appears Intact Coordination Gross Motor Movements are Fluid and Coordinated: Yes Fine Motor Movements are Fluid and Coordinated: No Finger Nose Finger Test: Impaired bilaterally Motor  Motor Motor: Within Functional Limits   Trunk/Postural Assessment  Cervical Assessment Cervical Assessment: Exceptions to [facility] (forward head) Thoracic Assessment Thoracic Assessment: Exceptions to [facility] (rounded shoulders) Lumbar Assessment Lumbar Assessment: Exceptions to [facility] (posterior pelvic tilt) Postural Control Postural Control: Deficits on evaluation Righting Reactions: Delayed  Balance Balance Balance Assessed: Yes Static Sitting Balance Static Sitting - Balance Support: Feet supported Static Sitting - Level of Assistance: 7: Independent Dynamic Sitting Balance Dynamic Sitting - Balance Support: Feet supported Dynamic Sitting - Level of Assistance: 5: Stand by assistance Static Standing Balance Static Standing - Balance Support: Bilateral upper extremity supported Static Standing - Level of Assistance: 4: Min assist Dynamic Standing Balance Dynamic  Standing - Balance Support: Bilateral upper extremity supported Dynamic Standing - Level of Assistance: 4: Min assist Extremity Assessment  RLE Assessment RLE Assessment: Exceptions to [facility] General Strength Comments: Grossly 4+/5 LLE Assessment LLE Assessment: Exceptions to [facility] General Strength Comments: Grossly 4+/5  Care Tool Care Tool Bed Mobility Roll left and right activity   Roll left and right assist level: Minimal Assistance - Patient > 75%    Sit to lying activity  Sit to lying assist level: Minimal Assistance - Patient > 75%    Lying to sitting edge of bed activity   Lying to sitting edge of bed assist level: Minimal Assistance - Patient > 75%     Care Tool Transfers Sit to stand transfer   Sit to stand assist level: Minimal Assistance - Patient > 75%    Chair/bed transfer   Chair/bed transfer assist level: Minimal Assistance - Patient > 75%     Toilet transfer   Assist Level: Minimal Assistance - Patient > 75%    Car transfer   Car transfer assist level: Minimal Assistance - Patient > 75%      Care Tool Locomotion Ambulation   Assist level: Minimal Assistance - Patient > 75% Assistive device: Walker-rolling Max distance: 7f  Walk 10 feet activity   Assist level: Minimal Assistance - Patient > 75% Assistive device: Walker-rolling   Walk 50 feet with 2 turns activity Walk 50 feet with 2 turns activity did not occur: Safety/medical concerns      Walk 150 feet activity Walk 150 feet activity did not occur: Safety/medical concerns      Walk 10 feet on uneven surfaces activity Walk 10 feet on uneven surfaces activity did not occur: Safety/medical concerns      Stairs Stair activity did not occur: Safety/medical concerns        Walk up/down 1 step activity Walk up/down 1 step or curb (drop down) activity did not occur: Safety/medical concerns     Walk up/down 4 steps activity did not occuR: Safety/medical concerns  Walk up/down 4 steps activity       Walk up/down 12 steps activity Walk up/down 12 steps activity did not occur: Safety/medical concerns      Pick up small objects from floor Pick up small object from the floor (from standing position) activity did not occur: Safety/medical concerns      Wheelchair Will patient use wheelchair at discharge?: No          Wheel 50 feet with 2 turns activity      Wheel 150 feet activity        Refer to Care Plan for Long Term Goals  SHORT TERM GOAL WEEK 1 PT Short Term Goal 1 (Week 1): None due to ELOS  Recommendations for other services: None   Skilled Therapeutic Intervention  Evaluation completed (see details above and below) with education on PT POC and goals and individual treatment initiated with focus on bed mobility, balance, transfers, and ambulation.  Mobility and ambulation detailed below. Pt verbalizes no pain but significant level of fatigue during session and requires frequent rest breaks. Pt also verbalizes feeling like he is going to pass out during toilet transfer. BP taken in sitting at 118/57.   PT sizes pt for WC and cushion. Verbal and tactile cues provided for safe bed mobility, hand placement and sequencing for sit to stand transfer and stand pivot to WC, maintaining upright gaze to improve posture and balance, and importance of upright positioning to improve functional endurance.  Pt left seated in WC with alarm intact and all needs within reach.  Mobility Bed Mobility Bed Mobility: Supine to Sit Supine to Sit: Minimal Assistance - Patient > 75% Transfers Transfers: Sit to Stand;Stand to Sit;Stand Pivot Transfers Sit to Stand: Minimal Assistance - Patient > 75% Stand to Sit: Minimal Assistance - Patient > 75% Stand Pivot Transfers: Minimal Assistance - Patient > 75% Stand Pivot Transfer Details: Verbal cues for sequencing;Verbal  cues for safe use of DME/AE;Verbal cues for technique;Tactile cues for sequencing;Tactile cues for placement Transfer  (Assistive device): Rolling walker Locomotion  Gait Ambulation: Yes Gait Assistance: Minimal Assistance - Patient > 75% Gait Distance (Feet): 10 Feet Assistive device: Rolling walker Gait Assistance Details: Verbal cues for technique;Verbal cues for precautions/safety;Tactile cues for weight shifting;Tactile cues for sequencing Gait Gait: Yes Gait Pattern: Impaired Gait Pattern: Shuffle;Trunk flexed Gait velocity: decreased Stairs / Additional Locomotion Stairs: No Wheelchair Mobility Wheelchair Mobility: No   Discharge Criteria: Patient will be discharged from PT if patient refuses treatment 3 consecutive times without medical reason, if treatment goals not met, if there is a change in medical status, if patient makes no progress towards goals or if patient is discharged from hospital.  The above assessment, treatment plan, treatment alternatives and goals were discussed and mutually agreed upon: by patient  Breck Coons, PT, DPT 06/11/2020, 4:42 PM

## 2020-06-11 NOTE — Plan of Care (Signed)
  Problem: Consults Goal: RH GENERAL PATIENT EDUCATION Description: See Patient Education module for education specifics. Outcome: Progressing Goal: Skin Care Protocol Initiated - if Braden Score 18 or less Description: If consults are not indicated, leave blank or document N/A Outcome: Progressing Goal: Nutrition Consult-if indicated Outcome: Progressing Goal: Diabetes Guidelines if Diabetic/Glucose > 140 Description: If diabetic or lab glucose is > 140 mg/dl - Initiate Diabetes/Hyperglycemia Guidelines & Document Interventions  Outcome: Progressing   Problem: RH BOWEL ELIMINATION Goal: RH STG MANAGE BOWEL WITH ASSISTANCE Description: STG Manage Bowel with min Assistance. Outcome: Progressing Goal: RH STG MANAGE BOWEL W/MEDICATION W/ASSISTANCE Description: STG Manage Bowel with Medication with min Assistance. Outcome: Progressing   Problem: RH BLADDER ELIMINATION Goal: RH STG MANAGE BLADDER WITH ASSISTANCE Description: STG Manage Bladder With min Assistance Outcome: Progressing   Problem: RH SKIN INTEGRITY Goal: RH STG SKIN FREE OF INFECTION/BREAKDOWN Description: Will stay free of infection or new pressure areas  Outcome: Progressing Goal: RH STG MAINTAIN SKIN INTEGRITY WITH ASSISTANCE Description: STG Maintain Skin Integrity With min Assistance. Outcome: Progressing Goal: RH STG ABLE TO PERFORM INCISION/WOUND CARE W/ASSISTANCE Description: STG Able To Perform Incision/Wound Care With min Assistance. Outcome: Progressing   Problem: RH SAFETY Goal: RH STG ADHERE TO SAFETY PRECAUTIONS W/ASSISTANCE/DEVICE Description: STG Adhere to Safety Precautions With Assistance/Device. Outcome: Progressing   Problem: RH PAIN MANAGEMENT Goal: RH STG PAIN MANAGED AT OR BELOW PT'S PAIN GOAL Description: Pain scale <3/10 Outcome: Progressing   Problem: RH KNOWLEDGE DEFICIT GENERAL Goal: RH STG INCREASE KNOWLEDGE OF SELF CARE AFTER HOSPITALIZATION Description: Patient will have  knowledge of illnesses & precautions to take against exacerbation Outcome: Progressing

## 2020-06-11 NOTE — Progress Notes (Signed)
[facility] PHYSICAL MEDICINE & REHABILITATION PROGRESS NOTE   Subjective/Complaints:  occ cough , mild SOB noted but pt denies  ROS- epigastric pain last noc (after eating peanut butter)  , no abd pain , no CP   Objective:   No results found. Recent Labs    06/09/20 0123 06/10/20 0307  WBC 9.7 7.8  HGB 7.9* 8.0*  HCT 24.9* 25.6*  PLT 321 283   Recent Labs    06/09/20 0123 06/10/20 0307  NA 134* 135  K 5.0 5.1  CL 97* 97*  CO2 22 21*  GLUCOSE 159* 88  BUN 86* 108*  CREATININE 9.68* 11.56*  CALCIUM 8.2* 8.1*    Intake/Output Summary (Last 24 hours) at 06/11/2020 0803 Last data filed at 06/10/2020 2217 Gross per 24 hour  Intake 240 ml  Output --  Net 240 ml     Physical Exam: Vital Signs Blood pressure 107/61, pulse 80, temperature 98.9 F (37.2 C), temperature source Oral, resp. rate 20, height 6\' 2"  (1.88 m), weight 101.1 kg, SpO2 96 %.   General: No acute distress Mood and affect are appropriate Heart: Regular rate and rhythm no rubs murmurs or extra sounds Lungs: Clear to auscultation, breathing unlabored, no rales or wheezes Abdomen: Positive bowel sounds, soft nontender to palpation, nondistended Extremities: No clubbing, cyanosis, or edema Skin: No evidence of breakdown, no evidence of rash Diatek cath R upper chest  Neurologic: Cranial nerves II through XII intact, motor strength is 5/5 in bilateral deltoid, bicep, tricep, grip, hip flexor, knee extensors, ankle dorsiflexor and plantar flexor Sensory exam normal sensation to light touch and proprioception in bilateral upper and lower extremities Cerebellar exam normal finger to nose to finger as well as heel to shin in bilateral upper and lower extremities Musculoskeletal: Full range of motion in all 4 extremities. No joint swelling    Assessment/Plan: 1. Functional deficits secondary to debility renal cell carcinoma which require 3+ hours per day of interdisciplinary therapy in a comprehensive  inpatient rehab setting.  Physiatrist is providing close team supervision and 24 hour management of active medical problems listed below.  Physiatrist and rehab team continue to assess barriers to discharge/monitor patient progress toward functional and medical goals  Care Tool:  Bathing              Bathing assist       Upper Body Dressing/Undressing Upper body dressing        Upper body assist      Lower Body Dressing/Undressing Lower body dressing            Lower body assist       Toileting Toileting    Toileting assist       Transfers Chair/bed transfer  Transfers assist           Locomotion Ambulation   Ambulation assist              Walk 10 feet activity   Assist           Walk 50 feet activity   Assist           Walk 150 feet activity   Assist           Walk 10 feet on uneven surface  activity   Assist           Wheelchair     Assist               Wheelchair 50 feet with 2 turns  activity    Assist            Wheelchair 150 feet activity     Assist          Blood pressure 107/61, pulse 80, temperature 98.9 F (37.2 C), temperature source Oral, resp. rate 20, height 6\' 2"  (1.88 m), weight 101.1 kg, SpO2 96 %.  Medical Problem List and Plan: 1.Functional and mobility deficitssecondary to debility after right nephrectomy and multiple post-op medical complications -patient may shower -ELOS/Goals: 5-7 days, mod I 2. Antithrombotics: -DVT/anticoagulation:Mechanical:Sequential compression devices, below kneeBilateral lower extremities -antiplatelet therapy: N/A 3. Pain Management:Tylenol prn 4. Mood:LCSW to follow for evaluation and support. -antipsychotic agents: N/A 5. Neuropsych: This patientiscapable of making decisions onhisown behalf. 6. Skin/Wound Care:Monitor wound for healing. Protein supplements  to promote healing. 7. Fluids/Electrolytes/Nutrition:Change to Carb modified/renal diet--not on fluid restriction at this time. Monitor strict I/O as well as daily weights.  9. Persistent Hypotension: SBP 80-90 range--patient asymptomatic. On midodrine (decreased to 5 mg tid on 08/03) as well as steroids (decreased to 10 mg bid on 08/02)--recommendations to wean as tolerated.  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 HD TTS--will schedule at the end of the day to help with tolerance of therapy.  11. ABLA:Serial labs with HD. On areanesp weekly and s/p iron repletion.  12. New onset A fib with RVR: Monitor HR tid--on low dose metoprolol bid.  Vitals:   06/10/20 2010 06/11/20 0304  BP: 107/63 107/61  Pulse: (!) 106 80  Resp: 20 20  Temp: 98.4 F (36.9 C) 98.9 F (37.2 C)  SpO2: 95% 96%   13. H/o Asthma: Continue pulmicort, brovana and Xopenex nebs.  14. T2DM diet controlled: Hgb A1C- 6.2 and controlled. Will monitor BS ac/hs and use SSI for elevated BS--likely due to stress and steroids. Will continue Lantus for now and wean as able.  CBG (last 3)  Recent Labs    06/10/20 1046 06/10/20 2204 06/11/20 0558  GLUCAP 117* 107* 102*      LOS: 1 days A FACE TO FACE EVALUATION WAS PERFORMED  Luanna Salk Mylinh Cragg 06/11/2020, 8:03 AM

## 2020-06-11 NOTE — Progress Notes (Signed)
Patient ID: Todd Robertson, male   DOB: 15-Jan-1946, 74 y.o.   MRN: [id number]   Patient set up with Theressa Stamps, placed on [facility] waiting list. New HD days Tuesday, Thursday and Saturday

## 2020-06-11 NOTE — Progress Notes (Signed)
Physical Therapy Session Note  Patient Details  Name: Todd Robertson MRN: [id number] Date of Birth: 20-Dec-1945  Today's Date: 06/11/2020 PT Individual Time: 1345-1445 PT Individual Time Calculation (min): 60 min    Skilled Therapeutic Interventions/Progress Updates:    Pt received sitting in WC and agreeable to PT  Orthostatic VS: seated 107/62. HR 99. Unable to attain on first bout of standing due to severe orthostatic hypotension s/s. Following prolonged rest break, Pt then performed stand pivot transfer to bed with RW and min-mod assist to power into standing from recliner. While in standing BP assessed 91/39. HR 88, again reports severe s/s, which improved once sitting. Due to severe orthostatic presentation, PT completed aEOB.  RN made aware of pt's BP   Seated therex: LAQ x 12 HS curl with level 2 tband x12 Hip abduction with level 2 tband  x12 Isometric hip adduction to squeeze therapy ball x12 Hip flexion with levl 2 tband x10 Ankle PF x 15   Supine therex  LE: SAQ x 12 Bridge x 10  SLR x 10  clam shells x 12 with level 2 tband  Ankle DF x 15 UE: Elbow extension x12 Elbow flexion x 12 Chest press x10 AROM on the L due to new placement of fistula and level 2 tband on the RUE.   Cues for decreased speed and full ROM to maximize strengthening aspects of movements throughout.   Sit<>supine  With supervision assist and cues for decreased use of UE on rails as tolerate by pt.   Pt requesting to rest due to fatigue at end of session in bed. Left supine in bed with family present and all needs met      Therapy Documentation Precautions:  Precautions Precautions: Fall Restrictions Weight Bearing Restrictions: No Vital Signs: Therapy Vitals Temp: 98.3 F (36.8 C) Pulse Rate: 80 Resp: 19 BP: (!) 92/51 Patient Position (if appropriate): Sitting Oxygen Therapy SpO2: 100 % O2 Device: Room Air Pain: Pain Assessment Pain Scale: 0-10 Pain Score: 0-No  pain  Therapy/Group: Individual Therapy  [name] 06/11/2020, 3:21 PM

## 2020-06-11 NOTE — Progress Notes (Signed)
Patient has been accepted at [facility] on a TTS schedule with a seat time of 9:00am. Navigator notified patient's wife, who chose this seat over the other option given (MWF 5:45am).  Navigator will monitor for discharge date and will notify OP HD clinic of discharge date/start date with clinic.   Alphonzo Cruise,  Renal Navigator [phone]

## 2020-06-12 ENCOUNTER — Inpatient Hospital Stay (HOSPITAL_COMMUNITY): Payer: Medicare Other | Admitting: Physical Therapy

## 2020-06-12 ENCOUNTER — Inpatient Hospital Stay (HOSPITAL_COMMUNITY): Payer: Medicare Other | Admitting: Occupational Therapy

## 2020-06-12 DIAGNOSIS — L8990 Pressure ulcer of unspecified site, unspecified stage: Secondary | ICD-10-CM | POA: Insufficient documentation

## 2020-06-12 LAB — GLUCOSE, CAPILLARY
Glucose-Capillary: 74 mg/dL (ref 70–99)
Glucose-Capillary: 76 mg/dL (ref 70–99)
Glucose-Capillary: 55 mg/dL — ABNORMAL LOW (ref 70–99)
Glucose-Capillary: 68 mg/dL — ABNORMAL LOW (ref 70–99)
Glucose-Capillary: 109 mg/dL — ABNORMAL HIGH (ref 70–99)
Glucose-Capillary: 136 mg/dL — ABNORMAL HIGH (ref 70–99)

## 2020-06-12 LAB — RENAL FUNCTION PANEL
Albumin: 2.3 g/dL — ABNORMAL LOW (ref 3.5–5.0)
Anion gap: 15 (ref 5–15)
BUN: 69 mg/dL — ABNORMAL HIGH (ref 8–23)
CO2: 22 mmol/L (ref 22–32)
Calcium: 8.1 mg/dL — ABNORMAL LOW (ref 8.9–10.3)
Chloride: 98 mmol/L (ref 98–111)
Creatinine, Ser: 10.73 mg/dL — ABNORMAL HIGH (ref 0.61–1.24)
GFR calc Af Amer: 5 mL/min — ABNORMAL LOW (ref >60)
GFR calc non Af Amer: 4 mL/min — ABNORMAL LOW (ref >60)
Glucose, Bld: 85 mg/dL (ref 70–99)
Phosphorus: 7.2 mg/dL — ABNORMAL HIGH (ref 2.5–4.6)
Potassium: 4.6 mmol/L (ref 3.5–5.1)
Sodium: 135 mmol/L (ref 135–145)

## 2020-06-12 LAB — CBC
HCT: 26.5 % — ABNORMAL LOW (ref 39.0–52.0)
Hemoglobin: 7.9 g/dL — ABNORMAL LOW (ref 13.0–17.0)
MCH: 28.5 pg (ref 26.0–34.0)
MCHC: 29.8 g/dL — ABNORMAL LOW (ref 30.0–36.0)
MCV: 95.7 fL (ref 80.0–100.0)
Platelets: 283 10*3/uL (ref 150–400)
RBC: 2.77 MIL/uL — ABNORMAL LOW (ref 4.22–5.81)
RDW: 18.5 % — ABNORMAL HIGH (ref 11.5–15.5)
WBC: 8.9 10*3/uL (ref 4.0–10.5)
nRBC: 0 % (ref 0.0–0.2)

## 2020-06-12 MED ORDER — HEPARIN SODIUM (PORCINE) 1000 UNIT/ML DIALYSIS
1000.0000 [IU] | INTRAMUSCULAR | Status: DC | PRN
  Filled 2020-06-12: qty 1

## 2020-06-12 MED ORDER — PENTAFLUOROPROP-TETRAFLUOROETH EX AERO: 1.0000 "application " | INHALATION_SPRAY | CUTANEOUS | Status: DC | PRN

## 2020-06-12 MED ORDER — SODIUM CHLORIDE 0.9 % IV SOLN: 100.0000 mL | INTRAVENOUS | Status: DC | PRN

## 2020-06-12 MED ORDER — ALTEPLASE 2 MG IJ SOLR
2.0000 mg | Freq: Once | INTRAMUSCULAR | Status: DC | PRN
  Filled 2020-06-12: qty 2

## 2020-06-12 MED ORDER — HEPARIN SODIUM (PORCINE) 1000 UNIT/ML IJ SOLN
INTRAMUSCULAR | Status: AC
  Administered 2020-06-12: 3800 [IU] via INTRAVENOUS_CENTRAL
  Filled 2020-06-12: qty 4

## 2020-06-12 MED ORDER — LIDOCAINE HCL (PF) 1 % IJ SOLN: 5.0000 mL | INTRAMUSCULAR | Status: DC | PRN

## 2020-06-12 MED ORDER — LIDOCAINE-PRILOCAINE 2.5-2.5 % EX CREA
1.0000 "application " | TOPICAL_CREAM | CUTANEOUS | Status: DC | PRN
  Filled 2020-06-12: qty 5

## 2020-06-12 MED ORDER — HYDROCORTISONE 5 MG PO TABS
5.0000 mg | ORAL_TABLET | Freq: Two times a day (BID) | ORAL | Status: DC
  Administered 2020-06-12 – 2020-06-17 (×12): 5 mg via ORAL
  Filled 2020-06-12 (×16): qty 1

## 2020-06-12 MED ORDER — DARBEPOETIN ALFA 200 MCG/0.4ML IJ SOSY
200.0000 ug | PREFILLED_SYRINGE | INTRAMUSCULAR | Status: DC
  Administered 2020-06-21: 200 ug via INTRAVENOUS
  Filled 2020-06-12 (×2): qty 0.4

## 2020-06-12 MED ORDER — INSULIN DETEMIR 100 UNIT/ML ~~LOC~~ SOLN
10.0000 [IU] | Freq: Every day | SUBCUTANEOUS | Status: DC
  Administered 2020-06-12 – 2020-06-13 (×2): 10 [IU] via SUBCUTANEOUS
  Filled 2020-06-12 (×2): qty 0.1

## 2020-06-12 NOTE — Progress Notes (Signed)
[place] PHYSICAL MEDICINE & REHABILITATION PROGRESS NOTE   Subjective/Complaints:  Pt not aware of skin area on buttocks , no pain  ROS- epigastric pain last noc (after eating peanut butter)  , no abd pain , no CP   Objective:   No results found. Recent Labs    06/10/20 0307  WBC 7.8  HGB 8.0*  HCT 25.6*  PLT 283   Recent Labs    06/10/20 0307  NA 135  K 5.1  CL 97*  CO2 21*  GLUCOSE 88  BUN 108*  CREATININE 11.56*  CALCIUM 8.1*    Intake/Output Summary (Last 24 hours) at 06/12/2020 0805 Last data filed at 06/12/2020 0800 Gross per 24 hour  Intake 782 ml  Output --  Net 782 ml     Physical Exam: Vital Signs Blood pressure 108/67, pulse 86, temperature 98.1 F (36.7 C), resp. rate (!) 22, height 6\' 2"  (1.88 m), weight 101.6 kg, SpO2 95 %.    General: No acute distress Mood and affect are appropriate Heart: Regular rate and rhythm no rubs murmurs or extra sounds Lungs: Clear to auscultation, breathing unlabored, no rales or wheezes Abdomen: Positive bowel sounds, soft nontender to palpation, nondistended Extremities: No clubbing, cyanosis, or edema Skin: small 1cm abrasion Right buttocks, no drainage or erythema or tenderness Neurologic: Cranial nerves II through XII intact, motor strength is 5/5 in bilateral deltoid, bicep, tricep, grip, hip flexor, knee extensors, ankle dorsiflexor and plantar flexor Sensory exam normal sensation to light touch and proprioception in bilateral upper and lower extremities  Musculoskeletal: Full range of motion in all 4 extremities. No joint swelling    Assessment/Plan: 1. Functional deficits secondary to debility renal cell carcinoma which require 3+ hours per day of interdisciplinary therapy in a comprehensive inpatient rehab setting.  Physiatrist is providing close team supervision and 24 hour management of active medical problems listed below.  Physiatrist and rehab team continue to assess barriers to  discharge/monitor patient progress toward functional and medical goals  Care Tool:  Bathing    Body parts bathed by patient: Right arm, Left arm, Abdomen, Chest, Front perineal area, Face, Left upper leg, Right upper leg   Body parts bathed by helper: Buttocks, Right lower leg, Left lower leg     Bathing assist Assist Level: Moderate Assistance - Patient 50 - 74%     Upper Body Dressing/Undressing Upper body dressing   What is the patient wearing?: Pull over shirt    Upper body assist Assist Level: Minimal Assistance - Patient > 75%    Lower Body Dressing/Undressing Lower body dressing      What is the patient wearing?: Pants, Underwear/pull up     Lower body assist Assist for lower body dressing: Moderate Assistance - Patient 50 - 74%     Toileting Toileting    Toileting assist Assist for toileting: Minimal Assistance - Patient > 75%     Transfers Chair/bed transfer  Transfers assist     Chair/bed transfer assist level: Minimal Assistance - Patient > 75%     Locomotion Ambulation   Ambulation assist      Assist level: Minimal Assistance - Patient > 75% Assistive device: Walker-rolling Max distance: 47ft   Walk 10 feet activity   Assist     Assist level: Minimal Assistance - Patient > 75% Assistive device: Walker-rolling   Walk 50 feet activity   Assist Walk 50 feet with 2 turns activity did not occur: Safety/medical concerns  Walk 150 feet activity   Assist Walk 150 feet activity did not occur: Safety/medical concerns         Walk 10 feet on uneven surface  activity   Assist Walk 10 feet on uneven surfaces activity did not occur: Safety/medical concerns         Wheelchair     Assist Will patient use wheelchair at discharge?: No             Wheelchair 50 feet with 2 turns activity    Assist            Wheelchair 150 feet activity     Assist          Blood pressure 108/67, pulse 86,  temperature 98.1 F (36.7 C), resp. rate (!) 22, height 6\' 2"  (1.88 m), weight 101.6 kg, SpO2 95 %.  Medical Problem List and Plan: 1.Functional and mobility deficitssecondary to debility after right nephrectomy and multiple post-op medical complications -patient may shower -ELOS/Goals: 5-7 days, mod I 2. Antithrombotics: -DVT/anticoagulation:Mechanical:Sequential compression devices, below kneeBilateral lower extremities -antiplatelet therapy: N/A 3. Pain Management:Tylenol prn 4. Mood:LCSW to follow for evaluation and support. -antipsychotic agents: N/A 5. Neuropsych: This patientiscapable of making decisions onhisown behalf. 6. Skin/Wound Care:Monitor wound for healing. Protein supplements to promote healing. 7. Fluids/Electrolytes/Nutrition:Change to Carb modified/renal diet--not on fluid restriction at this time. Monitor strict I/O as well as daily weights.  9. Persistent Hypotension: -patient asymptomatic. On midodrine (decreased to 5 mg tid on 08/03) as well as steroids (decreased to 10 mg bid on 08/02)--recommendations to wean as tolerated.  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 HD TTS--will schedule at the end of the day to help with tolerance of therapy.  11. ABLA:Serial labs with HD. On areanesp weekly and s/p iron repletion.  12. New onset A fib with RVR: Monitor HR tid--on low dose metoprolol bid.  Vitals:   06/11/20 2155 06/12/20 0525  BP:  108/67  Pulse:  86  Resp:  (!) 22  Temp:  98.1 F (36.7 C)  SpO2: 92% 95%  sys BPs > 100 will reduce solu cortef 13. H/o Asthma: Continue pulmicort, brovana and Xopenex nebs.  14. T2DM diet controlled: Hgb A1C- 6.2 and controlled. Will monitor BS ac/hs and use SSI for elevated BS--likely due to stress and steroids.  and wean as able.  CBG (last 3)  Recent Labs    06/11/20 1637 06/11/20 2134 06/12/20 0625  GLUCAP 91 104* 74    am CBG on low side will reduce levimir  to 10U   LOS: 2 days A FACE TO FACE EVALUATION WAS PERFORMED  Charlett Blake 06/12/2020, 8:05 AM

## 2020-06-12 NOTE — Progress Notes (Signed)
Occupational Therapy Session Note  Patient Details  Name: Todd Robertson MRN: [id number] Date of Birth: [birth year]  Today's Date: 06/12/2020 OT Individual Time: 1245-8099 OT Individual Time Calculation (min): 53 min    Short Term Goals: Week 1:  OT Short Term Goal 1 (Week 1): STG = LTG  Skilled Therapeutic Interventions/Progress Updates:    Pt up in the wheelchair to start, agreeable to ADL session at the sink.  BP was taken at 121/51 in sitting to start.  He then completed UB bathing with setup at the sink.  Noted dyspnea at 2-3/4 while working in sitting with HR in the upper 60s and O2 sats at 100%.  He was able to complete sit to stand and remove LB clothing at min assist level.  LB bathing sit to stand was at mod assist as he could not stand long enough to assist with washing his buttocks before needing to sit down secondary to light headedness.  Unable to obtain standing BP during session secondary to limitations in standing endurance.  His symptoms would decrease once he sat.  He was able to transfer to the elevated toilet with min assist using the RW.  Max assist for toilet hygiene as well.  He was able to cross his LEs up over each knee for washing his feet as as well as for donning his gripper socks.  Min assist overall for LB dressing, with the exception of thigh length TEDs, which were completed at total assist level.  Finished session with transfer back to the bed in preparation for dialysis at min assist level with use of the RW.  Supervision for supine to sit with call button and phone in reach and safety alarm in place.    Therapy Documentation Precautions:  Precautions Precautions: Fall Restrictions Weight Bearing Restrictions: No  Pain: Pain Assessment Pain Scale: Faces Pain Score: 0-No pain ADL: See Care Tool Section for some details of mobility and selfcare  Therapy/Group: Individual Therapy  Ximena Todaro OTR/L 06/12/2020, 12:41 PM

## 2020-06-12 NOTE — Progress Notes (Signed)
Physical Therapy Session Note  Patient Details  Name: Todd Robertson MRN: [id number] Date of Birth: 1946-06-16  Today's Date: 06/12/2020 PT Individual Time: 0805-0900 PT Individual Time Calculation (min): 55 min   Short Term Goals: Week 1:  PT Short Term Goal 1 (Week 1): None due to ELOS  Skilled Therapeutic Interventions/Progress Updates:   Pt received supine in bed and agreeable to PT. PT applied thigh high teds for BP support. PT assessed orthostatic VS : supine: 104/63, HR 75. Sitting 116/73 HR 66. Standing: 110/58, HR 113. Moderate orthostatic s/s, pt rates lighthededness 7/10.    Supine>sit transfer with supervision assist assist and cues for use of bed rails and safety as needed. Sit<>stand and stand pivot transfer to and from [place] with min assist for safety and cues for pursed lip breathing as pt noted to have labored breathing with transfers and gait.  Pt performed 5xSTS: 66 sec with min assist and BUE support (>15 sec indicates increased fall risk) average of 2 bouts with prolonged rese break between bouts   PT instructed pt in TUG: 32 sec with RW (average of 2 trials; >13.5 sec indicates increased fall risk) min assist for safety and mild knee instability noted at the end of each bout.   Pt declined WC mobility, concerned of causing damage to fistula sight, then returned to room and left sitting in [facility] with call bell in reach and all needs met.          Therapy Documentation Precautions:  Precautions Precautions: Fall Restrictions Weight Bearing Restrictions: No Vital Signs: Therapy Vitals Temp: 98.1 F (36.7 C) Pulse Rate: 86 Resp: (!) 22 BP: 108/67 Patient Position (if appropriate): Lying Oxygen Therapy SpO2: 95 % O2 Device: Room Air Pain:   denies  Therapy/Group: Individual Therapy  [name] 06/12/2020, 9:07 AM

## 2020-06-12 NOTE — Progress Notes (Signed)
Subjective:  Looks good-  Due for HD later today-  Wants throat lozenges and his IVs out Objective Vital signs in last 24 hours: Vitals:   06/11/20 2155 06/12/20 0500 06/12/20 0525 06/12/20 0700  BP:   108/67   Pulse:   86   Resp:   (!) 22   Temp:   98.1 F (36.7 C)   TempSrc:      SpO2: 92%  95%   Weight:  101.4 kg  101.6 kg  Height:       Weight change: -8.4 kg  Intake/Output Summary (Last 24 hours) at 06/12/2020 1053 Last data filed at 06/12/2020 0800 Gross per 24 hour  Intake 662 ml  Output --  Net 662 ml    Assessment/ Plan: Pt is a 74 y.o. yo male with advanced CKD at baseline who was admitted on 06/10/2020 with need for nephrectomy  Assessment/Plan: 1. RCC-  Need for nephrectomy - done on 7/9- c/b bleeding and progression to ESRD 2. ESRD - new diagnosis this admit-  HD initiated-  On a TTS schedule- next due later today-  Has TDC and AVG placed 7/30.  CLIP in process-  Family desires [facility]-  [facility] be [facility]  on TTS schedule 3. Anemia- hgb in the 7's-  On Darbe 200 weekly and s/p iron repletion 4. Secondary hyperparathyroidism- PTH 116, calc and phos WNL-  No meds 5. HTN/volume-  req midodrine for BP support  Will inc to max dose as well as cortef.  Low dose metoprolol for Afib- min UF with HD 6. Malnutrition-  On supps 7. Dispo-   CIR but hope eventually to home with OP HD [facility]    Labs: Basic Metabolic Panel: Recent Labs  Lab 06/08/20 0241 06/09/20 0123 06/10/20 0307  NA 135 134* 135  K 4.4 5.0 5.1  CL 98 97* 97*  CO2 23 22 21*  GLUCOSE 108* 159* 88  BUN 60* 86* 108*  CREATININE 7.13* 9.68* 11.56*  CALCIUM 8.3* 8.2* 8.1*   Liver Function Tests: No results for input(s): AST, ALT, ALKPHOS, BILITOT, PROT, ALBUMIN in the last 168 hours. No results for input(s): LIPASE, AMYLASE in the last 168 hours. No results for input(s): AMMONIA in the last 168 hours. CBC: Recent Labs  Lab 06/06/20 0418 06/06/20 0418  06/07/20 0200 06/07/20 0200 06/08/20 0241 06/09/20 0123 06/10/20 0307  WBC 13.4*   < > 12.3*   < > 10.4 9.7 7.8  HGB 7.7*   < > 8.4*   < > 7.6* 7.9* 8.0*  HCT 24.0*   < > 25.8*   < > 24.6* 24.9* 25.6*  MCV 94.1  --  94.2  --  93.5 93.6 93.4  PLT 300   < > 325   < > 277 321 283   < > = values in this interval not displayed.   Cardiac Enzymes: No results for input(s): CKTOTAL, CKMB, CKMBINDEX, TROPONINI in the last 168 hours. CBG: Recent Labs  Lab 06/11/20 0558 06/11/20 1209 06/11/20 1637 06/11/20 2134 06/12/20 0625  GLUCAP 102* 119* 91 104* 74    Iron Studies: No results for input(s): IRON, TIBC, TRANSFERRIN, FERRITIN in the last 72 hours. Studies/Results: No results found. Medications: Infusions:   Scheduled Medications: . arformoterol  15 mcg Nebulization BID  . budesonide (PULMICORT) nebulizer solution  0.5 mg Nebulization BID  . Chlorhexidine Gluconate Cloth  6 each Topical Q0600  . Chlorhexidine Gluconate Cloth  6 each Topical Q0600  . [  START ON 06/14/2020] darbepoetin (ARANESP) injection - DIALYSIS  200 mcg Intravenous Q Sat-HD  . hydrocortisone  5 mg Oral BID WC  . insulin aspart  0-5 Units Subcutaneous QHS  . insulin aspart  0-9 Units Subcutaneous TID WC  . insulin detemir  10 Units Subcutaneous QHS  . mouth rinse  15 mL Mouth Rinse q12n4p  . metoprolol tartrate  12.5 mg Oral BID  . midodrine  10 mg Oral TID WC  . multivitamin  1 tablet Oral QHS    have reviewed scheduled and prn medications.  Physical Exam: General: obese, pleasant-  Minimal mobility- NAD Heart: RRR Lungs: mostly clear Abdomen: obese, soft, non tender Extremities: min edema Dialysis Access: left AVG-  thrill - right TDC    06/12/2020,10:53 AM  LOS: 2 days

## 2020-06-12 NOTE — Progress Notes (Signed)
Physical Therapy Session Note  Patient Details  Name: Todd Robertson MRN: [id number] Date of Birth: [birth year]  Today's Date: 06/12/2020 PT Individual Time: 0905-1003 PT Individual Time Calculation (min): 58 min   Short Term Goals: Week 1:  PT Short Term Goal 1 (Week 1): None due to ELOS  Skilled Therapeutic Interventions/Progress Updates:   Pt received sitting in w/c having just finished prior PT session but agreeable to therapy session again. Reports slight lightheadedness symptoms vitals assessed: BP 104/58 (MAP 72), HR 78bpm, SpO2 97% Pt still wearing B LE thigh high TED hose.  Transported to/from gym in w/c for time management and energy conservation. Sit<>stands w/c<>RW with CGA/supervision during session. Gait training ~23ft x2 using RW with CGA for safety but no unsteadiness noted - demos slow gait speed with decreased B LE step lengths and foot clearance - assessed vitals after BP 119/51 (MAP 69), HR ranging 86-110bpm and pt reports lightheadedness rated as 6/10. Stand pivot w/c<>Nustep, no AD, with CGA for safety/steadying. Performed 2 bouts of 2 minutes of B UE and B LE reciprocal movements on Nustep against level 3 resistance totaling ~110steps each trial - pt unable to state RPE using Borg scale despite cuing to work around level 11 with handout provided. Stand pivot w/<>EOM, no AD, with CGA for steadying. Dynamic standing balance task with L HHA via alternate B LE foot taps on 4" step with min assist for balance - pt able to complete ~6reps prior to needing to sit due to rated 8/10 lightheadedness. Vitals assessed: BP 83/63 (MAP 71), HR 64bpm - pt reports symptoms improving - reassessed BP 124/51 (MAP 72), HR ranging 174bpm decreased to 119bpm - Pam, PA and Dr. Letta Pate made aware. Transported back to room in w/c and pt left sitting with needs in reach and chair alarm on.  Therapy Documentation Precautions:  Precautions Precautions: Fall Restrictions Weight Bearing Restrictions:  No  Pain: No reports of pain throughout session.  Therapy/Group: Individual Therapy  Tawana Scale , PT, DPT, CSRS  06/12/2020, 7:58 AM

## 2020-06-13 ENCOUNTER — Inpatient Hospital Stay (HOSPITAL_COMMUNITY): Payer: Medicare Other | Admitting: Physical Therapy

## 2020-06-13 ENCOUNTER — Inpatient Hospital Stay (HOSPITAL_COMMUNITY): Payer: Medicare Other | Admitting: Occupational Therapy

## 2020-06-13 ENCOUNTER — Encounter (HOSPITAL_COMMUNITY): Payer: Medicare Other | Admitting: Psychology

## 2020-06-13 LAB — GLUCOSE, CAPILLARY
Glucose-Capillary: 101 mg/dL — ABNORMAL HIGH (ref 70–99)
Glucose-Capillary: 101 mg/dL — ABNORMAL HIGH (ref 70–99)
Glucose-Capillary: 91 mg/dL (ref 70–99)
Glucose-Capillary: 92 mg/dL (ref 70–99)

## 2020-06-13 MED ORDER — CALCIUM ACETATE (PHOS BINDER) 667 MG PO CAPS
1334.0000 mg | ORAL_CAPSULE | Freq: Three times a day (TID) | ORAL | Status: DC
  Administered 2020-06-13 – 2020-06-18 (×13): 1334 mg via ORAL
  Filled 2020-06-13 (×13): qty 2

## 2020-06-13 MED ORDER — CHLORHEXIDINE GLUCONATE CLOTH 2 % EX PADS
6.0000 | MEDICATED_PAD | Freq: Every day | CUTANEOUS | Status: DC
  Administered 2020-06-14 – 2020-06-24 (×9): 6 via TOPICAL

## 2020-06-13 NOTE — Progress Notes (Signed)
Occupational Therapy Session Note  Patient Details  Name: Todd Robertson MRN: [id number] Date of Birth: [birth year]  Today's Date: 06/13/2020 OT Individual Time: 9201-0071 OT Individual Time Calculation (min): 69 min    Short Term Goals: Week 1:  OT Short Term Goal 1 (Week 1): STG = LTG  Skilled Therapeutic Interventions/Progress Updates:  Patient met lying supine in bed in agreement with OT treatment session with focus on activity tolerance, functional tranfers, and self-are re-education as detailed below. Supine to EOB with supervision A and increased time. Vitals assessed with BP of 106/84, HR of 79bpm, and SpO2 100% on RA. Attempted BP in standing but patient unable to maintain position long enough. Functional mobility to sink level for grooming tasks in sitting with set-up assist. Total A for wc transport to ortho gym for energy conservation. Patient tolerated 15 minute on arm ergometer with rest breaks every 2-2.5 minutes 2/2 fatigue and DOE. Education on pursed lip breathing with patient able to return demonstrate throughout therapeutic exercise. Patient completed sit to stand x5 trials from elevated mat table without use of RW to challenge standing balance and endurance. Session concluded with patient lying supine in bed, call bell within reach, and all needs met.   Therapy Documentation Precautions:  Precautions Precautions: Fall Restrictions Weight Bearing Restrictions: No General:    Therapy/Group: Individual Therapy  Jes Costales R Howerton-Davis 06/13/2020, 9:18 AM

## 2020-06-13 NOTE — Progress Notes (Signed)
Physical Therapy Session Note  Patient Details  Name: Todd Robertson MRN: [id number] Date of Birth: 1946-06-18  Today's Date: 06/13/2020 PT Individual Time: 0802-0859 PT Individual Time Calculation (min): 57 min   Short Term Goals: Week 1:  PT Short Term Goal 1 (Week 1): None due to ELOS  Skilled Therapeutic Interventions/Progress Updates: Pt presents supine in bed w/ nurse taking BP of 97/60, HR 92.  Thigh-high TEDS pullde up past knees and agreeable to sit EOB.  Performed w/ supervision and use of bed rails.  Pt sat EOB w/ slouched posture and states "feels weird, but not dizzy"  BP at 88/64, HR 99.  Pt performed calf raises while sitting.  Attempted to perform standing orthostatics but unable to remain standing.  Pt transferred sit to stand from elevated bed w/ min A and then used RW to transfer to w/c.  Wheeled to gym for energy and time conservation.  Pt performed multiple sit to stand transfers w/ min A and encouragement for initiation.  Pt able to stand multiple trials only up to 30 seconds per trial, unable to register BP. Pt required extended seated rest breaks, but O2 sats on RA remained > 98% and heart rate increased to 112 bpm.   Pt returned to room  And remained in w/c w/ seat alarm on and all needs in reach.     Therapy Documentation Precautions:  Precautions Precautions: Fall Restrictions Weight Bearing Restrictions: No General:   Vital Signs: Therapy Vitals Temp: 99.5 F (37.5 C) Temp Source: Oral Pulse Rate: 92 Resp: 19 BP: 97/60 Patient Position (if appropriate): Sitting Oxygen Therapy SpO2: 98 % O2 Device: Room Air Pain: 0/10      Therapy/Group: Individual Therapy  Ladoris Gene 06/13/2020, 9:02 AM

## 2020-06-13 NOTE — Progress Notes (Signed)
Subjective:  Looks good-  HD late yesterday - removed 2 liters Objective Vital signs in last 24 hours: Vitals:   06/12/20 2052 06/13/20 0500 06/13/20 0531 06/13/20 0800  BP:   106/61 97/60  Pulse: 91  100 92  Resp: 18  19   Temp:   99.5 F (37.5 C)   TempSrc:   Oral   SpO2: 97%  98%   Weight:  108.7 kg    Height:       Weight change: 8.4 kg  Intake/Output Summary (Last 24 hours) at 06/13/2020 1206 Last data filed at 06/12/2020 1858 Gross per 24 hour  Intake 222 ml  Output 2086 ml  Net -1864 ml    Assessment/ Plan: Pt is a 74 y.o. yo male with advanced CKD at baseline who was admitted on 06/10/2020 with need for nephrectomy  Assessment/Plan: 1. RCC-  Need for nephrectomy - done on 7/9- c/b bleeding and progression to ESRD 2. ESRD - new diagnosis this admit-  HD initiated-  On a TTS schedule- next due Saturday-  Has TDC and AVG placed 7/30.    Will be [facility] on TTS schedule upon discharge 3. Anemia- hgb in the 7's-  On Darbe 200 weekly and s/p iron repletion 4. Secondary hyperparathyroidism- PTH 116, calc WNL-  Last phos high-  Will start phoslo for binding  5. HTN/volume-  req midodrine for BP support  Will inc to max dose as well as cortef.  Low dose metoprolol for Afib-  UF as able with HD 6. Malnutrition-  On supps 7. Dispo-   CIR but hope eventually to home with OP HD [facility] TTS  Louis Meckel    Labs: Basic Metabolic Panel: Recent Labs  Lab 06/09/20 0123 06/10/20 0307 06/12/20 1226  NA 134* 135 135  K 5.0 5.1 4.6  CL 97* 97* 98  CO2 22 21* 22  GLUCOSE 159* 88 85  BUN 86* 108* 69*  CREATININE 9.68* 11.56* 10.73*  CALCIUM 8.2* 8.1* 8.1*  PHOS  --   --  7.2*   Liver Function Tests: Recent Labs  Lab 06/12/20 1226  ALBUMIN 2.3*   No results for input(s): LIPASE, AMYLASE in the last 168 hours. No results for input(s): AMMONIA in the last 168 hours. CBC: Recent Labs  Lab 06/07/20 0200 06/07/20 0200 06/08/20 0241 06/08/20 0241  06/09/20 0123 06/10/20 0307 06/12/20 1226  WBC 12.3*   < > 10.4   < > 9.7 7.8 8.9  HGB 8.4*   < > 7.6*   < > 7.9* 8.0* 7.9*  HCT 25.8*   < > 24.6*   < > 24.9* 25.6* 26.5*  MCV 94.2  --  93.5  --  93.6 93.4 95.7  PLT 325   < > 277   < > 321 283 283   < > = values in this interval not displayed.   Cardiac Enzymes: No results for input(s): CKTOTAL, CKMB, CKMBINDEX, TROPONINI in the last 168 hours. CBG: Recent Labs  Lab 06/12/20 1826 06/12/20 1854 06/12/20 2105 06/13/20 0613 06/13/20 1123  GLUCAP 68* 109* 136* 101* 101*    Iron Studies: No results for input(s): IRON, TIBC, TRANSFERRIN, FERRITIN in the last 72 hours. Studies/Results: No results found. Medications: Infusions:   Scheduled Medications: . arformoterol  15 mcg Nebulization BID  . budesonide (PULMICORT) nebulizer solution  0.5 mg Nebulization BID  . Chlorhexidine Gluconate Cloth  6 each Topical Q0600  . Chlorhexidine Gluconate Cloth  6 each Topical Q0600  . [  START ON 06/14/2020] darbepoetin (ARANESP) injection - DIALYSIS  200 mcg Intravenous Q Sat-HD  . hydrocortisone  5 mg Oral BID WC  . insulin aspart  0-5 Units Subcutaneous QHS  . insulin aspart  0-9 Units Subcutaneous TID WC  . insulin detemir  10 Units Subcutaneous QHS  . mouth rinse  15 mL Mouth Rinse q12n4p  . metoprolol tartrate  12.5 mg Oral BID  . midodrine  10 mg Oral TID WC  . multivitamin  1 tablet Oral QHS    have reviewed scheduled and prn medications.  Physical Exam: General: obese, pleasant-  Minimal mobility- NAD Heart: RRR Lungs: mostly clear Abdomen: obese, soft, non tender Extremities: min edema Dialysis Access: left AVG-  thrill - right [facility]    06/13/2020,12:06 PM  LOS: 3 days

## 2020-06-13 NOTE — Plan of Care (Signed)
  Problem: RH BOWEL ELIMINATION Goal: RH STG MANAGE BOWEL WITH ASSISTANCE Description: STG Manage Bowel with mod I Assistance. Outcome: Progressing Goal: RH STG MANAGE BOWEL W/MEDICATION W/ASSISTANCE Description: STG Manage Bowel with Medication with mod I Assistance. Outcome: Progressing   Problem: RH BLADDER ELIMINATION Goal: RH STG MANAGE BLADDER WITH ASSISTANCE Description: STG Manage Bladder With min Assistance Outcome: Progressing

## 2020-06-13 NOTE — Progress Notes (Signed)
Physical Therapy Session Note  Patient Details  Name: Todd Robertson MRN: [id number] Date of Birth: 07-11-1946  Today's Date: 06/13/2020 PT Individual Time: 1420-1515 PT Individual Time Calculation (min): 55 min   Short Term Goals: Week 1:  PT Short Term Goal 1 (Week 1): None due to ELOS  Skilled Therapeutic Interventions/Progress Updates:   Pt received sitting in [facility] and requires encouragement to be agreeable to PT. When PT questioned pt about the events of the day, pt was noted to have severe SOB  With difficulty coordinating breath with phonation. PT assessed VS via Dynamap. HR 54-99. SpO2 97%. PT manually assessed HR with noted irregularly irregular rhythm at 80 BPM. RN notified and then present to assess pt, HR found to mildly irregular at 98bpm. Pt performed stand pivot transfer to [facility] with CGA and RW. Once EOB additional RN present to administer breathing treatment. Pt declined functional reaching of fine motor task while sitting EOB for breathing treatment. Once treatment completed, pt performed sit>supine with supervision assist for safety.   Agreeable to bed level therex. With level 2 tband: SAQ, heel slides, ankle PF, hip flexion. Pt performed each exercise x 12 with cues for full ROM and decreased speed as tolerated. Throughout supine therex, pt noted to continue to have shallow respirations at a rate of 25-30 breaths per min; no change noted in respiratory rate following cues from PT to improve depth of breaths. HR noted to fluctuate 50-90 bpm when assessed manually by PT. Due to pt fatigue and high respiratory rate, therapy session ended. Pt left supine in bed with call bell in reach. RN made aware of pt's presentation.        Therapy Documentation Precautions:  Precautions Precautions: Fall Restrictions Weight Bearing Restrictions: No    Vital Signs: Therapy Vitals Temp: 98.8 F (37.1 C) Pulse Rate: 100 Resp: 28 BP: 123/62 Patient Position (if appropriate):  Lying Oxygen Therapy SpO2: 99 % O2 Device: Room Air Pain: denies   Therapy/Group: Individual Therapy  [name] 06/13/2020, 3:13 PM

## 2020-06-13 NOTE — IPOC Note (Signed)
Overall Plan of Care [facility]) Patient Details Name: Todd Robertson MRN: [id number] DOB: 04/22/1946  Admitting Diagnosis: Physical debility  Hospital Problems: Principal Problem:   Physical debility Active Problems:   Pressure injury of skin     Functional Problem List: Nursing Safety, Nutrition, Endurance, Medication Management, Skin Integrity  PT Balance, Endurance, Motor, Pain, Safety  OT Balance, Endurance, Safety  SLP    TR         Basic ADL's: OT Grooming, Bathing, Dressing, Toileting     Advanced  ADL's: OT       Transfers: PT Bed Mobility, Bed to Chair, Car  OT Toilet, Tub/Shower     Locomotion: PT Ambulation, Stairs     Additional Impairments: OT    SLP        TR      Anticipated Outcomes Item Anticipated Outcome  Self Feeding Independent  Swallowing      Basic self-care  Supervision A  Toileting  Supervision A   Bathroom Transfers Supervision A  Bowel/Bladder  Will remain continenet  Transfers     Locomotion     Communication     Cognition     Pain  Will have pain controlled at an acceptable level  Safety/Judgment  will not have any falls with injury while on CIR   Therapy Plan: PT Intensity: Minimum of 1-2 x/day ,45 to 90 minutes PT Frequency: 5 out of 7 days PT Duration Estimated Length of Stay: 7-10 days OT Intensity: Minimum of 1-2 x/day, 45 to 90 minutes OT Frequency: 5 out of 7 days OT Duration/Estimated Length of Stay: 7-10 days     Due to the current state of emergency, patients may not be receiving their 3-hours of Medicare-mandated therapy.   Team Interventions: Nursing Interventions Medication Management, Disease Management/Prevention, Skin Care/Wound Management, Discharge Planning, Patient/Family Education  PT interventions Ambulation/gait training, Community reintegration, DME/adaptive equipment instruction, Neuromuscular re-education, Psychosocial support, Stair training, UE/LE Strength taining/ROM,  Training and development officer, Discharge planning, Functional electrical stimulation, Wheelchair propulsion/positioning, Pain management, Therapeutic Activities, UE/LE Coordination activities, Cognitive remediation/compensation, Disease management/prevention, Functional mobility training, Patient/family education, Splinting/orthotics, Therapeutic Exercise, Visual/perceptual remediation/compensation  OT Interventions Community reintegration, Discharge planning, Engineer, drilling, Training and development officer, Functional mobility training, Patient/family education, Self Care/advanced ADL retraining, Therapeutic Activities, Therapeutic Exercise, UE/LE Coordination activities  SLP Interventions    TR Interventions    SW/CM Interventions Discharge Planning, Psychosocial Support, Patient/Family Education   Barriers to Discharge MD  Medical stability and Hemodialysis  Nursing Hemodialysis    PT Home environment access/layout, Hemodialysis    OT      SLP      SW       Team Discharge Planning: Destination: PT-Home ,OT- Home , SLP-  Projected Follow-up: PT-24 hour supervision/assistance, Home health PT, OT-  Home health OT, SLP-  Projected Equipment Needs: PT-To be determined, OT- Other (comment), SLP-  Equipment Details: PT- , OT-TBD Patient/family involved in discharge planning: PT- Patient,  OT-Patient, SLP-   MD ELOS: 7d Medical Rehab Prognosis:  Good Assessment:  74 year old male with history of T2DM-diet controlled, NICM, CKD, 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 He was admitted on 05/16/20 for right nephrectomy with lysis of adhesis by Dr. Alyson Ingles. Post op course significant for hypotension with acute on chronic renal failure as well as ABLA due to large volume hemorrhage into right nephrectomy bed. He developed volume overload as well as acute metabolic encephalopathy due to uremia requiring CRRT. He developed septic shock requiring  pressors and acute  respiratory failure requiring with reintubation 07/13- 07/17. He was treated with broad spectrum antibiotics for HCAP and was weaned off pressors . He has recurrent drop in H/H on 07/20 requiring one unit PRBC as well as vasopressors. He was started on midodrine. Repeat CT abdomen on 07/21 showed hematoma resolving.   Hospital course significant for persistent hypotension --required CRRT and stress dose steroids, episode of NSVT, recurrent N/V concerning for ileus, blood output from NGT, persistent leucocytosis with recurrent fevers. BLE dopplers were negative for DVT. He defervesced without antibiotics and Right IJ tunneled catheter and left forearm graft placed on 07/31 by Dr. Donnetta Hutching. He developed A fib with RVR on 07/31 and was started on low dose BB. Cardiology consulted on 08/02 and felt that patient at high risk for [facility] and heart rate controlled but BP remain soft and may need to hold BB on dialysis days. Stress dose steroids being weaned off and midodrine decreased to 5 mg tid today   Now requiring 24/7 Rehab RN,MD, as well as CIR level PT, OT and SLP.  Treatment team will focus on ADLs and mobility with goals set at sup  See Team Conference Notes for weekly updates to the plan of care

## 2020-06-13 NOTE — Progress Notes (Signed)
[place] PHYSICAL MEDICINE & REHABILITATION PROGRESS NOTE   Subjective/Complaints:  Slept poorly , dislikes hosp bed, no breathing issues, discussed that cepacol is prn and pt must call nurse   ROS-no CP, SOB, N/V/D Objective:   No results found. Recent Labs    06/12/20 1226  WBC 8.9  HGB 7.9*  HCT 26.5*  PLT 283   Recent Labs    06/12/20 1226  NA 135  K 4.6  CL 98  CO2 22  GLUCOSE 85  BUN 69*  CREATININE 10.73*  CALCIUM 8.1*    Intake/Output Summary (Last 24 hours) at 06/13/2020 0752 Last data filed at 06/12/2020 1858 Gross per 24 hour  Intake 462 ml  Output 2086 ml  Net -1624 ml     Physical Exam: Vital Signs Blood pressure 106/61, pulse 100, temperature 99.5 F (37.5 C), temperature source Oral, resp. rate 19, height 6\' 2"  (1.88 m), weight 108.7 kg, SpO2 98 %.    General: No acute distress Mood and affect are appropriate Heart: Regular rate and rhythm no rubs murmurs or extra sounds Lungs: Clear to auscultation, breathing unlabored, no rales or wheezes Abdomen: Positive bowel sounds, soft nontender to palpation, nondistended Extremities: No clubbing, cyanosis, or edema Skin: small 1cm abrasion Right buttocks, no drainage or erythema or tenderness Neurologic: Cranial nerves II through XII intact, motor strength is 5/5 in bilateral deltoid, bicep, tricep, grip, hip flexor, knee extensors, ankle dorsiflexor and plantar flexor Sensory exam normal sensation to light touch and proprioception in bilateral upper and lower extremities  Musculoskeletal: Full range of motion in all 4 extremities. No joint swelling    Assessment/Plan: 1. Functional deficits secondary to debility renal cell carcinoma which require 3+ hours per day of interdisciplinary therapy in a comprehensive inpatient rehab setting.  Physiatrist is providing close team supervision and 24 hour management of active medical problems listed below.  Physiatrist and rehab team continue to assess  barriers to discharge/monitor patient progress toward functional and medical goals  Care Tool:  Bathing    Body parts bathed by patient: Right arm, Left arm, Abdomen, Chest, Front perineal area, Face, Left upper leg, Right upper leg, Right lower leg, Left lower leg   Body parts bathed by helper: Buttocks     Bathing assist Assist Level: Minimal Assistance - Patient > 75%     Upper Body Dressing/Undressing Upper body dressing   What is the patient wearing?: Pull over shirt    Upper body assist Assist Level: Minimal Assistance - Patient > 75%    Lower Body Dressing/Undressing Lower body dressing      What is the patient wearing?: Pants     Lower body assist Assist for lower body dressing: Moderate Assistance - Patient 50 - 74%     Toileting Toileting    Toileting assist Assist for toileting: Moderate Assistance - Patient 50 - 74%     Transfers Chair/bed transfer  Transfers assist     Chair/bed transfer assist level: Contact Guard/Touching assist     Locomotion Ambulation   Ambulation assist      Assist level: Contact Guard/Touching assist Assistive device: Walker-rolling Max distance: 61ft   Walk 10 feet activity   Assist     Assist level: Contact Guard/Touching assist Assistive device: Walker-rolling   Walk 50 feet activity   Assist Walk 50 feet with 2 turns activity did not occur: Safety/medical concerns         Walk 150 feet activity   Assist Walk 150 feet activity  did not occur: Safety/medical concerns         Walk 10 feet on uneven surface  activity   Assist Walk 10 feet on uneven surfaces activity did not occur: Safety/medical concerns         Wheelchair     Assist Will patient use wheelchair at discharge?: No   Wheelchair activity did not occur: Safety/medical concerns         Wheelchair 50 feet with 2 turns activity    Assist    Wheelchair 50 feet with 2 turns activity did not occur: Safety/medical  concerns       Wheelchair 150 feet activity     Assist  Wheelchair 150 feet activity did not occur: Safety/medical concerns       Blood pressure 106/61, pulse 100, temperature 99.5 F (37.5 C), temperature source Oral, resp. rate 19, height 6\' 2"  (1.88 m), weight 108.7 kg, SpO2 98 %.  Medical Problem List and Plan: 1.Functional and mobility deficitssecondary to debility after right nephrectomy and multiple post-op medical complications -patient may shower -ELOS/Goals: 5-7 days, mod I CIR PT< OT  2. Antithrombotics: -DVT/anticoagulation:Mechanical:Sequential compression devices, below kneeBilateral lower extremities -antiplatelet therapy: N/A 3. Pain Management:Tylenol prn 4. Mood:LCSW to follow for evaluation and support. -antipsychotic agents: N/A 5. Neuropsych: This patientiscapable of making decisions onhisown behalf. 6. Skin/Wound Care:Monitor wound for healing. Protein supplements to promote healing. 7. Fluids/Electrolytes/Nutrition:Change to Carb modified/renal diet--not on fluid restriction at this time. Monitor strict I/O as well as daily weights.  9. Persistent Hypotension: -patient asymptomatic. On midodrine (decreased to 5 mg tid on 08/03) as well as steroids (decreased to 10 mg bid on 08/02)--recommendations to wean as tolerated.  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 HD TTS--will schedule at the end of the day to help with tolerance of therapy.  11. ABLA:Serial labs with HD. On areanesp weekly and s/p iron repletion.  12. New onset A fib with RVR: Monitor HR tid--on low dose metoprolol bid.  Vitals:   06/12/20 2052 06/13/20 0531  BP:  106/61  Pulse: 91 100  Resp: 18 19  Temp:  99.5 F (37.5 C)  SpO2: 97% 98%  sys BPs > 100 will reduce solu cortef 13. H/o Asthma: Continue pulmicort, brovana and Xopenex nebs.  14. T2DM diet controlled: Hgb A1C- 6.2 and controlled. Will monitor BS ac/hs and use SSI  for elevated BS--likely due to stress and steroids.  and wean as able.  CBG (last 3)  Recent Labs    06/12/20 1854 06/12/20 2105 06/13/20 0613  GLUCAP 109* 136* 101*    am CBG on low side will reduce levimir to 10U on 8/6 improved   LOS: 3 days A FACE TO FACE EVALUATION WAS PERFORMED  Charlett Blake 06/13/2020, 7:52 AM

## 2020-06-13 NOTE — Consult Note (Signed)
Neuropsychological Consultation   Patient:   Todd Robertson   DOB:   1946/01/04  MR Number:  [id number]  Location:  [place] [address] CENTER [address] [place] [id number] [place] [address] Dept: [place]: [phone]           Date of Service:   06/13/2020  Start Time:   9 AM End Time:   10 AM  Provider/Observer:  Ilean Skill, Psy.D.       Clinical Neuropsychologist       Billing Code/Service: 73710  Chief Complaint:    Todd Robertson. 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  Patient was admitted on 05/16/2020 for right nephrectomy with lysis of adhesis.  Postop course significant for hypertension with acute on chronic renal failure as well as ABLA due to large volume hemorrhage into right nephrectomy bed.  Patient developed acute metabolic encephalopathy due to uremia requiring CRRT.  Patient developed septic shock requiring pressors and acute respiratory failure requiring reintubation.  Patient treated with broad-spectrum antibiotics.  Developed numerous persistent metabolic and medical issues.  Patient referred to CIR recommendations due to debility.  Reason for Service:  The patient was referred for neuropsychological consultation due to coping and adjustment with extended hospital stay and significant medical issues.  Below see HPI for the current admission.  GYI:RSWNIO 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 He was admitted on 05/16/20 for right nephrectomy with lysis of adhesis by Dr. Alyson Ingles. Post op course significant for hypotension with acute on chronic renal failure as well as ABLA due to large volume hemorrhage into right nephrectomy bed. He developed volume overload as well as acute  metabolic encephalopathy due to uremia requiring CRRT. He developed septic shock requiring pressors and acute respiratory failure requiring with reintubation 07/13- 07/17. He was treated with broad spectrum antibiotics for HCAP and was weaned off pressors . He has recurrent drop in H/H on 07/20 requiring one unit PRBC as well as vasopressors. He was started on midodrine. Repeat CT abdomen on 07/21 showed hematoma resolving.   Hospital course significant for persistent hypotension --required CRRT and stress dose steroids, episode of NSVT, recurrent N/V concerning for ileus, blood output from NGT, persistent leucocytosis with recurrent fevers. BLE dopplers were negative for DVT. He defervesced without antibiotics and Right IJ tunneled catheter and left forearm graft placed on 07/31 by Dr. Donnetta Hutching. He developed A fib with RVR on 07/31 and was started on low dose BB. Cardiology consulted on 08/02 and felt that patient at high risk for [facility] and heart rate controlled but BP remain soft and may need to hold BB on dialysis days. Stress dose steroids being weaned off and midodrine decreased to 5 mg tid today. Therapy ongoing and CIR recommended due to debility.   Current Status:  The patient was lethargic but oriented with good mental status beyond lethargy.  He did have some limits to his understanding of the overall status and said that he often relied on his wife and daughter to understand and manage medical details.  His wife and daughter were not present for this visit today.  The patient denied any significant anxiety and depressive symptoms at this time and overall feels like he is coping and managing relatively well with  extended hospital stay.  The patient reports that he understands the purpose and efforts with rehab and is motivated to continue to work on recovering.  However, he has rather frustrated with extended hospital stay but denies that emotional distress is keeping him from actively participating  in therapeutic process.  Behavioral Observation: [name]  presents as a 74 y.o.-year-old Right African American Male who appeared his stated age. his dress was Appropriate and he was Well Groomed and his manners were Appropriate to the situation.  his participation was indicative of Appropriate and Redirectable behaviors.  There were any physical disabilities noted.  he displayed an appropriate level of cooperation and motivation.     Interactions:    Active Appropriate and Redirectable  Attention:   abnormal and attention span appeared shorter than expected for age  Memory:   within normal limits; recent and remote memory intact  Visuo-spatial:  not examined  Speech (Volume):  low  Speech:   normal; slowed response style  Thought Process:  Coherent and Relevant  Though Content:  WNL; not suicidal and not homicidal  Orientation:   person, place, time/date and situation  Judgment:   Fair  Planning:   Fair  Affect:    Lethargic  Mood:    Dysphoric  Insight:   Fair  Intelligence:   normal  Medical History:   Past Medical History:  Diagnosis Date  . Arthritis   . Cancer [facility])    prostate  . Cardiomyopathy    secondary  . CKD (chronic kidney disease)   . DM2 (diabetes mellitus, type 2) ([place])   . Gout   . HTN (hypertension)    unspec  . Hypercholesterolemia   . Nonischemic cardiomyopathy ([place])    a. EF 40-50% by most recent 2-D echo b. EF 25% by cardiac cath in 2004  . Overweight(278.02)    Psychiatric History:  No prior psychiatric history  Family Med/Psych History:  Family History  Problem Relation Age of Onset  . Hypertension Mother   . Kidney disease Mother   . Heart disease Father   . 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  Patient was admitted on 05/16/2020 for right  nephrectomy with lysis of adhesis.  Postop course significant for hypertension with acute on chronic renal failure as well as ABLA due to large volume hemorrhage into right nephrectomy bed.  Patient developed acute metabolic encephalopathy due to uremia requiring CRRT.  Patient developed septic shock requiring pressors and acute respiratory failure requiring reintubation.  Patient treated with broad-spectrum antibiotics.  Developed numerous persistent metabolic and medical issues.  Patient referred to CIR recommendations due to debility.  The patient was lethargic but oriented with good mental status beyond lethargy.  He did have some limits to his understanding of the overall status and said that he often relied on his wife and daughter to understand and manage medical details.  His wife and daughter were not present for this visit today.  The patient denied any significant anxiety and depressive symptoms at this time and overall feels like he is coping and managing relatively well with extended hospital stay.  The patient reports that he understands the purpose and efforts with rehab and is motivated to continue to work on recovering.  However, he has rather frustrated with extended hospital stay but denies  that emotional distress is keeping him from actively participating in therapeutic process.  Disposition/Plan:  Today we worked on coping and adjustment issues and the patient does appear to be managing fairly well.  Diagnosis:    No diagnosis found.         Electronically Signed   _______________________ Ilean Skill, Psy.D.

## 2020-06-14 ENCOUNTER — Inpatient Hospital Stay (HOSPITAL_COMMUNITY): Payer: Medicare Other

## 2020-06-14 LAB — RENAL FUNCTION PANEL
Albumin: 2.3 g/dL — ABNORMAL LOW (ref 3.5–5.0)
Anion gap: 14 (ref 5–15)
BUN: 33 mg/dL — ABNORMAL HIGH (ref 8–23)
CO2: 25 mmol/L (ref 22–32)
Calcium: 8.3 mg/dL — ABNORMAL LOW (ref 8.9–10.3)
Chloride: 97 mmol/L — ABNORMAL LOW (ref 98–111)
Creatinine, Ser: 7.34 mg/dL — ABNORMAL HIGH (ref 0.61–1.24)
GFR calc Af Amer: 8 mL/min — ABNORMAL LOW (ref >60)
GFR calc non Af Amer: 7 mL/min — ABNORMAL LOW (ref >60)
Glucose, Bld: 88 mg/dL (ref 70–99)
Phosphorus: 4.3 mg/dL (ref 2.5–4.6)
Potassium: 4 mmol/L (ref 3.5–5.1)
Sodium: 136 mmol/L (ref 135–145)

## 2020-06-14 LAB — CBC
HCT: 28 % — ABNORMAL LOW (ref 39.0–52.0)
Hemoglobin: 8.5 g/dL — ABNORMAL LOW (ref 13.0–17.0)
MCH: 28.8 pg (ref 26.0–34.0)
MCHC: 30.4 g/dL (ref 30.0–36.0)
MCV: 94.9 fL (ref 80.0–100.0)
Platelets: 220 10*3/uL (ref 150–400)
RBC: 2.95 MIL/uL — ABNORMAL LOW (ref 4.22–5.81)
RDW: 18 % — ABNORMAL HIGH (ref 11.5–15.5)
WBC: 10 10*3/uL (ref 4.0–10.5)
nRBC: 0 % (ref 0.0–0.2)

## 2020-06-14 LAB — GLUCOSE, CAPILLARY
Glucose-Capillary: 85 mg/dL (ref 70–99)
Glucose-Capillary: 94 mg/dL (ref 70–99)
Glucose-Capillary: 88 mg/dL (ref 70–99)
Glucose-Capillary: 125 mg/dL — ABNORMAL HIGH (ref 70–99)

## 2020-06-14 MED ORDER — HEPARIN SODIUM (PORCINE) 1000 UNIT/ML IJ SOLN
INTRAMUSCULAR | Status: AC
  Administered 2020-06-14: 3800 [IU] via INTRAVENOUS_CENTRAL
  Filled 2020-06-14: qty 4

## 2020-06-14 MED ORDER — DARBEPOETIN ALFA 200 MCG/0.4ML IJ SOSY
PREFILLED_SYRINGE | INTRAMUSCULAR | Status: AC
  Administered 2020-06-14: 200 ug via INTRAVENOUS
  Filled 2020-06-14: qty 0.4

## 2020-06-14 MED ORDER — INSULIN DETEMIR 100 UNIT/ML ~~LOC~~ SOLN
7.0000 [IU] | Freq: Every day | SUBCUTANEOUS | Status: DC
  Administered 2020-06-14 – 2020-06-23 (×10): 7 [IU] via SUBCUTANEOUS
  Filled 2020-06-14 (×11): qty 0.07

## 2020-06-14 MED ORDER — FLUTICASONE PROPIONATE 50 MCG/ACT NA SUSP
2.0000 | Freq: Every day | NASAL | Status: DC
  Administered 2020-06-14 – 2020-06-25 (×12): 2 via NASAL
  Filled 2020-06-14 (×2): qty 16

## 2020-06-14 NOTE — Progress Notes (Addendum)
Subjective:  Looks good-  No c/o's - due for HD later today  Objective Vital signs in last 24 hours: Vitals:   06/13/20 1956 06/13/20 2049 06/14/20 0424 06/14/20 0448  BP: 112/83  99/64   Pulse: 78  72   Resp: 17  19   Temp: 98.6 F (37 C)  99.1 F (37.3 C)   TempSrc:   Oral   SpO2: 95% 95% 98%   Weight:    106.1 kg  Height:       Weight change: -3.7 kg  Intake/Output Summary (Last 24 hours) at 06/14/2020 1139 Last data filed at 06/14/2020 0815 Gross per 24 hour  Intake 372 ml  Output --  Net 372 ml    Assessment/ Plan: Pt is a 74 y.o. yo male with advanced CKD at baseline who was admitted on 06/10/2020 with need for nephrectomy  Assessment/Plan: 1. RCC-  Need for nephrectomy - done on 7/9- c/b bleeding and progression to ESRD 2. ESRD - new diagnosis this admit-  HD initiated-  On a TTS schedule- next due later today-  Has TDC and AVG placed 7/30.    Will be [facility] on TTS schedule upon discharge 3. Anemia- hgb in the 7's-  On Darbe 200 weekly and s/p iron repletion 4. Secondary hyperparathyroidism- PTH 116, calc WNL-  Last phos high-  Have started phoslo for binding  5. HTN/volume-  req midodrine for BP support  Will inc to max dose as well as cortef.  Low dose metoprolol for Afib-  UF as able with HD 6. Malnutrition-  On supps 7. Dispo-   CIR but hope eventually to home with OP HD [facility] TTS  Renal will not see tomorrow- will follow up again on Monday-  Call with any concerns tomorrow   Louis Meckel    Labs: Basic Metabolic Panel: Recent Labs  Lab 06/09/20 0123 06/10/20 0307 06/12/20 1226  NA 134* 135 135  K 5.0 5.1 4.6  CL 97* 97* 98  CO2 22 21* 22  GLUCOSE 159* 88 85  BUN 86* 108* 69*  CREATININE 9.68* 11.56* 10.73*  CALCIUM 8.2* 8.1* 8.1*  PHOS  --   --  7.2*   Liver Function Tests: Recent Labs  Lab 06/12/20 1226  ALBUMIN 2.3*   No results for input(s): LIPASE, AMYLASE in the last 168 hours. No results for input(s):  AMMONIA in the last 168 hours. CBC: Recent Labs  Lab 06/08/20 0241 06/08/20 0241 06/09/20 0123 06/10/20 0307 06/12/20 1226  WBC 10.4   < > 9.7 7.8 8.9  HGB 7.6*   < > 7.9* 8.0* 7.9*  HCT 24.6*   < > 24.9* 25.6* 26.5*  MCV 93.5  --  93.6 93.4 95.7  PLT 277   < > 321 283 283   < > = values in this interval not displayed.   Cardiac Enzymes: No results for input(s): CKTOTAL, CKMB, CKMBINDEX, TROPONINI in the last 168 hours. CBG: Recent Labs  Lab 06/13/20 0613 06/13/20 1123 06/13/20 1644 06/13/20 2117 06/14/20 0620  GLUCAP 101* 101* 91 92 85    Iron Studies: No results for input(s): IRON, TIBC, TRANSFERRIN, FERRITIN in the last 72 hours. Studies/Results: DG Abd 1 View  Result Date: 06/14/2020 CLINICAL DATA:  Abdominal distention. EXAM: ABDOMEN - 1 VIEW COMPARISON:  05/30/2020 FINDINGS: Nasogastric tube has been removed. Bowel gas pattern is nonobstructed. No evidence for free intraperitoneal air or organomegaly. IMPRESSION: Nonobstructive bowel gas pattern. Electronically Signed   By: Benjamine Mola  Owens Shark M.D.   On: 06/14/2020 10:14   Medications: Infusions:   Scheduled Medications: . arformoterol  15 mcg Nebulization BID  . budesonide (PULMICORT) nebulizer solution  0.5 mg Nebulization BID  . calcium acetate  1,334 mg Oral TID WC  . Chlorhexidine Gluconate Cloth  6 each Topical Q0600  . darbepoetin (ARANESP) injection - DIALYSIS  200 mcg Intravenous Q Sat-HD  . hydrocortisone  5 mg Oral BID WC  . insulin aspart  0-5 Units Subcutaneous QHS  . insulin aspart  0-9 Units Subcutaneous TID WC  . insulin detemir  7 Units Subcutaneous QHS  . mouth rinse  15 mL Mouth Rinse q12n4p  . metoprolol tartrate  12.5 mg Oral BID  . midodrine  10 mg Oral TID WC  . multivitamin  1 tablet Oral QHS    have reviewed scheduled and prn medications.  Physical Exam: General: obese, pleasant-  Minimal mobility- NAD Heart: RRR Lungs: mostly clear Abdomen: obese, soft, non tender Extremities:  min edema Dialysis Access: left AVG-  thrill - right TDC    06/14/2020,11:39 AM  LOS: 4 days

## 2020-06-14 NOTE — Progress Notes (Signed)
PHYSICAL MEDICINE & REHABILITATION PROGRESS NOTE   Subjective/Complaints:  + freq loose stools yesterday , doesn't feel like it was related to something he ate, + Nausea but neg vomiting   ROS-no CP, SOB, no abd pain  Objective:   No results found. Recent Labs    06/12/20 1226  WBC 8.9  HGB 7.9*  HCT 26.5*  PLT 283   Recent Labs    06/12/20 1226  NA 135  K 4.6  CL 98  CO2 22  GLUCOSE 85  BUN 69*  CREATININE 10.73*  CALCIUM 8.1*    Intake/Output Summary (Last 24 hours) at 06/14/2020 0536 Last data filed at 06/13/2020 1716 Gross per 24 hour  Intake 322 ml  Output --  Net 322 ml     Physical Exam: Vital Signs Blood pressure 99/64, pulse 72, temperature 99.1 F (37.3 C), temperature source Oral, resp. rate 19, height 6\' 2"  (1.88 m), weight 106.1 kg, SpO2 98 %.     Neurologic: Cranial nerves II through XII intact, motor strength is 5/5 in bilateral deltoid, bicep, tricep, grip, hip flexor, knee extensors, ankle dorsiflexor and plantar flexor Sensory exam normal sensation to light touch and proprioception in bilateral upper and lower extremities  Musculoskeletal: Full range of motion in all 4 extremities. No joint swelling    Assessment/Plan: 1. Functional deficits secondary to debility renal cell carcinoma which require 3+ hours per day of interdisciplinary therapy in a comprehensive inpatient rehab setting.  Physiatrist is providing close team supervision and 24 hour management of active medical problems listed below.  Physiatrist and rehab team continue to assess barriers to discharge/monitor patient progress toward functional and medical goals  Care Tool:  Bathing    Body parts bathed by patient: Right arm, Left arm, Abdomen, Chest, Front perineal area, Face, Left upper leg, Right upper leg, Right lower leg, Left lower leg   Body parts bathed by helper: Buttocks     Bathing assist Assist Level: Minimal Assistance - Patient > 75%     Upper  Body Dressing/Undressing Upper body dressing   What is the patient wearing?: Pull over shirt    Upper body assist Assist Level: Minimal Assistance - Patient > 75%    Lower Body Dressing/Undressing Lower body dressing      What is the patient wearing?: Pants     Lower body assist Assist for lower body dressing: Moderate Assistance - Patient 50 - 74%     Toileting Toileting    Toileting assist Assist for toileting: Moderate Assistance - Patient 50 - 74%     Transfers Chair/bed transfer  Transfers assist     Chair/bed transfer assist level: Contact Guard/Touching assist     Locomotion Ambulation   Ambulation assist      Assist level: Contact Guard/Touching assist Assistive device: Walker-rolling Max distance: 47ft   Walk 10 feet activity   Assist     Assist level: Contact Guard/Touching assist Assistive device: Walker-rolling   Walk 50 feet activity   Assist Walk 50 feet with 2 turns activity did not occur: Safety/medical concerns         Walk 150 feet activity   Assist Walk 150 feet activity did not occur: Safety/medical concerns         Walk 10 feet on uneven surface  activity   Assist Walk 10 feet on uneven surfaces activity did not occur: Safety/medical concerns         Wheelchair     Assist Will patient  use wheelchair at discharge?: No   Wheelchair activity did not occur: Safety/medical concerns         Wheelchair 50 feet with 2 turns activity    Assist    Wheelchair 50 feet with 2 turns activity did not occur: Safety/medical concerns       Wheelchair 150 feet activity     Assist  Wheelchair 150 feet activity did not occur: Safety/medical concerns       Blood pressure 99/64, pulse 72, temperature 99.1 F (37.3 C), temperature source Oral, resp. rate 19, height 6\' 2"  (1.88 m), weight 106.1 kg, SpO2 98 %.  Medical Problem List and Plan: 1.Functional and mobility deficitssecondary to debility  after right nephrectomy and multiple post-op medical complications -patient may shower -ELOS/Goals: 5-7 days, mod I CIR PT< OT  2. Antithrombotics: -DVT/anticoagulation:Mechanical:Sequential compression devices, below kneeBilateral lower extremities -antiplatelet therapy: N/A 3. Pain Management:Tylenol prn 4. Mood:LCSW to follow for evaluation and support. -antipsychotic agents: N/A 5. Neuropsych: This patientiscapable of making decisions onhisown behalf. 6. Skin/Wound Care:Monitor wound for healing. Protein supplements to promote healing. 7. Fluids/Electrolytes/Nutrition:Change to Carb modified/renal diet--not on fluid restriction at this time. Monitor strict I/O as well as daily weights.  9. Persistent Hypotension: -patient asymptomatic. On midodrine (decreased to 5 mg tid on 08/03) as well as steroids (decreased to 10 mg bid on 08/02)--recommendations to wean as tolerated.  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 HD TTS--will schedule at the end of the day to help with tolerance of therapy.  11. ABLA:Serial labs with HD. On areanesp weekly and s/p iron repletion.  12. New onset A fib with RVR: Monitor HR tid--on low dose metoprolol bid.  Vitals:   06/13/20 2049 06/14/20 0424  BP:  99/64  Pulse:  72  Resp:  19  Temp:  99.1 F (37.3 C)  SpO2: 95% 98%  HR controlled BPs a little soft but still ~100 mmHg sitting 13. H/o Asthma: Continue pulmicort, brovana and Xopenex nebs.  14. T2DM diet controlled: Hgb A1C- 6.2 and controlled. Will monitor BS ac/hs and use SSI for elevated BS--likely due to stress and steroids.  and wean as able.  CBG (last 3)  Recent Labs    06/13/20 1123 06/13/20 1644 06/13/20 2117  GLUCAP 101* 91 92    am CBG on low side will reduce levimir to 10U on 8/6 , Will reduce to 7U 8/7 15.  Diarrhea no laxative use , only 1 liquid stool thus far, no recent abx, no other meds suspected, If liquid stools cont  would check C diff, will check KUB an dkeep on clear liq for now  LOS: 4 days A FACE TO [place] E Maks Cavallero 06/14/2020, 5:36 AM

## 2020-06-15 ENCOUNTER — Inpatient Hospital Stay (HOSPITAL_COMMUNITY): Payer: Medicare Other

## 2020-06-15 ENCOUNTER — Inpatient Hospital Stay (HOSPITAL_COMMUNITY): Payer: Medicare Other | Admitting: Physical Therapy

## 2020-06-15 ENCOUNTER — Inpatient Hospital Stay (HOSPITAL_COMMUNITY): Payer: Medicare Other | Admitting: Occupational Therapy

## 2020-06-15 LAB — GLUCOSE, CAPILLARY
Glucose-Capillary: 119 mg/dL — ABNORMAL HIGH (ref 70–99)
Glucose-Capillary: 100 mg/dL — ABNORMAL HIGH (ref 70–99)
Glucose-Capillary: 114 mg/dL — ABNORMAL HIGH (ref 70–99)
Glucose-Capillary: 121 mg/dL — ABNORMAL HIGH (ref 70–99)

## 2020-06-15 MED ORDER — ONDANSETRON HCL 4 MG PO TABS
4.0000 mg | ORAL_TABLET | Freq: Three times a day (TID) | ORAL | Status: DC | PRN
  Administered 2020-06-15 – 2020-06-24 (×6): 4 mg via ORAL
  Filled 2020-06-15 (×6): qty 1

## 2020-06-15 NOTE — Progress Notes (Signed)
Physical Therapy Session Note  Patient Details  Name: IZAYAH MINER MRN: [id number] Date of Birth: [birth year]  Today's Date: 06/15/2020 PT Individual Time: 0801-0840 PT Individual Time Calculation (min): 39 min  Missed PT time: 20 min Short Term Goals: Week 1:  PT Short Term Goal 1 (Week 1): None due to ELOS    Skilled Therapeutic Interventions/Progress Updates: Pt presents supine in bed, stating just vomited.  Spoke w/ nursing and aware, waiting for PO Zofran, but states that pt feels better now after vomiting.  Pt agreeable to LE there ex in bed.  Pt performed 3 x 10-15  AP, HS, abd/add w/ extended rest breaks, c/o SOB.  BP in supine 122/64 and O2 on RA of 98%, HR increased to 115 bpm.  Pt transferred sup to sit w/ supervision to scoot to [facility] and BP checked at 79/66, returned to supine and BP improved to 103/79.  Bed alarm on and all needs in reach.  Spoke w/ nursing re: vitals and will re-assess.  Missed PT time of 20 min 2/2 fatigue and vital signs w/ activity.     Therapy Documentation Precautions:  Precautions Precautions: Fall Restrictions Weight Bearing Restrictions: No General: PT Amount of Missed Time (min): 20 Minutes PT Missed Treatment Reason: Patient fatigue;Other (Comment) (2/2 vital signs.) Vital Signs: Therapy Vitals BP: (!) 79/66 Patient Position (if appropriate): Sitting Oxygen Therapy O2 Device: Room Air Pain: 0/10 pain. Pain Assessment Pain Scale: 0-10 Pain Score: 0-No pain Mobility:      Therapy/Group: Individual Therapy  Ladoris Gene 06/15/2020, 8:50 AM

## 2020-06-15 NOTE — Progress Notes (Signed)
Physical Therapy Session Note  Patient Details  Name: Todd Robertson MRN: [id number] Date of Birth: [birth year]  Today's Date: 06/15/2020 PT Individual Time: 1108-1207 PT Individual Time Calculation (min): 59 min   Short Term Goals: Week 1:  PT Short Term Goal 1 (Week 1): None due to ELOS  Skilled Therapeutic Interventions/Progress Updates:     Patient in bed upon PT arrival. Patient alert and agreeable to PT session. Patient denied pain during session. Reports bouts of emisis following breakfast this morning and SOB with mild "light headedness" in lying, orthostatic vitals below. Reports that he ate 50% of breakfast, no change in diet, that feeling light headed and SOB have been going on for several days. Reports HD was "excellent" yesterday. Does report that he has not been sleeping well in the hospital. PT donned B thigh high TED hose with total A with patient in supine prior to mobility.  Orthostatic Vitals: Supine: BP 105/69, HR 79, SPO2 97% Sitting: BP 95/75, HR 77, SPO2 98% (increased SOB and light headed) After standing: BP 97/69, HR 101, SPO2 97% (increased SOB and light headed) HR Irregular with manual pulse on sitting and standing trials, RN made aware and stated this is not new for this patient.   Patient unable to tolerate standing and had to return to sitting for standing vitals assessment due to symptoms.   Therapeutic Activity: Bed Mobility: Patient performed supine to/from sit with mod I with HOB elevated due to SOB.  Transfers: Patient performed stand pivot bed<>w/c with supervision-CGA without AD for safety due to symptoms. Provided verbal cues for reaching back to sit and controlled descent for safety. Bathing/Dressing:Patient requested to bath at the sink during session. Doffed/donned t-shirt with set-up assist, doffed pants and underwear with mod I, required min A for pulling up pants and underwear, able to thread LEs through in sitting mod I. Performed UB bathing with  set-up assist, declined LB bathing due to fatigue.  Educated on SOB, diaphragmatic breathing, energy conservation techniques, and discussed d/c planning. Patient requesting to go home soon, states that he will move more and sleep better at home. Educated on risk of falls and decreased activity tolerance, however will pass on patient's request to lead therapist.   Patient in bed at end of session with breaks locked, bed alarm set, and all needs within reach. Vitals: BP 113/85, HR 86, SP02 96% (mild SOB and light headedness) in supine at end of session. Patient requesting breathing treatment, RN made aware.   Therapy Documentation Precautions:  Precautions Precautions: Fall Restrictions Weight Bearing Restrictions: No    Therapy/Group: Individual Therapy  Mayley Lish L Lendy Dittrich PT, DPT  06/15/2020, 12:23 PM

## 2020-06-15 NOTE — Progress Notes (Addendum)
PHYSICAL MEDICINE & REHABILITATION PROGRESS NOTE   Subjective/Complaints: Appreciate renal note 2 more loose stools, KUB looks ok .  Pt feels loose stools are "slowing down" may be diet related   ROS-no CP, SOB, no abd pain  Objective:   DG Abd 1 View  Result Date: 06/14/2020 CLINICAL DATA:  Abdominal distention. EXAM: ABDOMEN - 1 VIEW COMPARISON:  05/30/2020 FINDINGS: Nasogastric tube has been removed. Bowel gas pattern is nonobstructed. No evidence for free intraperitoneal air or organomegaly. IMPRESSION: Nonobstructive bowel gas pattern. Electronically Signed   By: Nolon Nations M.D.   On: 06/14/2020 10:14   Recent Labs    06/12/20 1226 06/14/20 1248  WBC 8.9 10.0  HGB 7.9* 8.5*  HCT 26.5* 28.0*  PLT 283 220   Recent Labs    06/12/20 1226 06/14/20 1248  NA 135 136  K 4.6 4.0  CL 98 97*  CO2 22 25  GLUCOSE 85 88  BUN 69* 33*  CREATININE 10.73* 7.34*  CALCIUM 8.1* 8.3*    Intake/Output Summary (Last 24 hours) at 06/15/2020 0553 Last data filed at 06/14/2020 2115 Gross per 24 hour  Intake 290 ml  Output 1465 ml  Net -1175 ml     Physical Exam: Vital Signs Blood pressure (!) 124/58, pulse (!) 105, temperature 99.2 F (37.3 C), resp. rate 15, height 6\' 2"  (1.88 m), weight 105.5 kg, SpO2 94 %.    General: No acute distress Mood and affect are appropriate Heart: Regular rate and rhythm no rubs murmurs or extra sounds Lungs: Clear to auscultation, breathing unlabored, no rales or wheezes Abdomen: Positive bowel sounds, soft nontender to palpation, nondistended Extremities: No clubbing, cyanosis, or edema Skin: No evidence of breakdown, no evidence of rash, incision LUE CDI   Neurologicmotor strength is 5/5 in bilateral deltoid, bicep, tricep, grip, hip flexor, knee extensors, ankle dorsiflexor and plantar flexor   Musculoskeletal: Full range of motion in all 4 extremities. No joint swelling    Assessment/Plan: 1. Functional deficits secondary  to debility renal cell carcinoma which require 3+ hours per day of interdisciplinary therapy in a comprehensive inpatient rehab setting.  Physiatrist is providing close team supervision and 24 hour management of active medical problems listed below.  Physiatrist and rehab team continue to assess barriers to discharge/monitor patient progress toward functional and medical goals  Care Tool:  Bathing    Body parts bathed by patient: Right arm, Left arm, Abdomen, Chest, Front perineal area, Face, Left upper leg, Right upper leg, Right lower leg, Left lower leg   Body parts bathed by helper: Buttocks     Bathing assist Assist Level: Minimal Assistance - Patient > 75%     Upper Body Dressing/Undressing Upper body dressing   What is the patient wearing?: Pull over shirt    Upper body assist Assist Level: Minimal Assistance - Patient > 75%    Lower Body Dressing/Undressing Lower body dressing      What is the patient wearing?: Pants     Lower body assist Assist for lower body dressing: Moderate Assistance - Patient 50 - 74%     Toileting Toileting    Toileting assist Assist for toileting: Moderate Assistance - Patient 50 - 74%     Transfers Chair/bed transfer  Transfers assist     Chair/bed transfer assist level: Contact Guard/Touching assist     Locomotion Ambulation   Ambulation assist      Assist level: Contact Guard/Touching assist Assistive device: Walker-rolling Max distance: 20ft  Walk 10 feet activity   Assist     Assist level: Contact Guard/Touching assist Assistive device: Walker-rolling   Walk 50 feet activity   Assist Walk 50 feet with 2 turns activity did not occur: Safety/medical concerns         Walk 150 feet activity   Assist Walk 150 feet activity did not occur: Safety/medical concerns         Walk 10 feet on uneven surface  activity   Assist Walk 10 feet on uneven surfaces activity did not occur: Safety/medical  concerns         Wheelchair     Assist Will patient use wheelchair at discharge?: No   Wheelchair activity did not occur: Safety/medical concerns         Wheelchair 50 feet with 2 turns activity    Assist    Wheelchair 50 feet with 2 turns activity did not occur: Safety/medical concerns       Wheelchair 150 feet activity     Assist  Wheelchair 150 feet activity did not occur: Safety/medical concerns       Blood pressure (!) 124/58, pulse (!) 105, temperature 99.2 F (37.3 C), resp. rate 15, height 6\' 2"  (1.88 m), weight 105.5 kg, SpO2 94 %.  Medical Problem List and Plan: 1.Functional and mobility deficitssecondary to debility after right nephrectomy and multiple post-op medical complications -patient may shower -ELOS/Goals: 5-7 days, mod I CIR PT< OT  2. Antithrombotics: -DVT/anticoagulation:Mechanical:Sequential compression devices, below kneeBilateral lower extremities -antiplatelet therapy: N/A 3. Pain Management:Tylenol prn 4. Mood:LCSW to follow for evaluation and support. -antipsychotic agents: N/A 5. Neuropsych: This patientiscapable of making decisions onhisown behalf. 6. Skin/Wound Care:Monitor wound for healing. Protein supplements to promote healing. 7. Fluids/Electrolytes/Nutrition:Change to Carb modified/renal diet--not on fluid restriction at this time. Monitor strict I/O as well as daily weights.  9. Persistent Hypotension: -patient asymptomatic. On midodrine (decreased to 5 mg tid on 08/03) as well as steroids (decreased to 10 mg bid on 08/02)--recommendations to wean as tolerated.  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 HD TTS--will schedule at the end of the day to help with tolerance of therapy.  11. ABLA:Serial labs with HD. On areanesp weekly and s/p iron repletion. Improving 12. New onset A fib with RVR: Monitor HR tid--on low dose metoprolol bid.  Vitals:   06/14/20 2124  06/15/20 0406  BP:  (!) 124/58  Pulse:  (!) 105  Resp:  15  Temp:  99.2 F (37.3 C)  SpO2: 96% 94%  HR controlled 8/8 13. H/o Asthma: Continue pulmicort, brovana and Xopenex nebs.  14. T2DM diet controlled: Hgb A1C- 6.2 and controlled. Will monitor BS ac/hs and use SSI for elevated BS--likely due to stress and steroids.  and wean as able.  CBG (last 3)  Recent Labs    06/14/20 1142 06/14/20 1725 06/14/20 2122  GLUCAP 94 88 125*    am CBG on low side will reduce levimir to 10U on 8/6 , Will reduce to 7U 8/7 15.  Diarrhea no laxative use , only 1 liquid stool thus far, no recent abx, no other meds suspected, If liquid stools cont would check C diff, will check KUB an dkeep on clear liq for now  LOS: 5 days A FACE TO FACE EVALUATION WAS PERFORMED  Charlett Blake 06/15/2020, 5:53 AM

## 2020-06-15 NOTE — Plan of Care (Signed)
°  Problem: Consults Goal: RH GENERAL PATIENT EDUCATION Description: See Patient Education module for education specifics. Outcome: Progressing Goal: Skin Care Protocol Initiated - if Braden Score 18 or less Description: If consults are not indicated, leave blank or document N/A Outcome: Progressing Goal: Nutrition Consult-if indicated Outcome: Progressing Goal: Diabetes Guidelines if Diabetic/Glucose > 140 Description: If diabetic or lab glucose is > 140 mg/dl - Initiate Diabetes/Hyperglycemia Guidelines & Document Interventions  Outcome: Progressing   Problem: RH BOWEL ELIMINATION Goal: RH STG MANAGE BOWEL WITH ASSISTANCE Description: STG Manage Bowel with mod I Assistance. Outcome: Progressing Goal: RH STG MANAGE BOWEL W/MEDICATION W/ASSISTANCE Description: STG Manage Bowel with Medication with mod I Assistance. Outcome: Progressing   Problem: RH BLADDER ELIMINATION Goal: RH STG MANAGE BLADDER WITH ASSISTANCE Description: STG Manage Bladder With min Assistance Outcome: Progressing   Problem: RH SKIN INTEGRITY Goal: RH STG SKIN FREE OF INFECTION/BREAKDOWN Description: Will stay free of infection or new pressure areas  Outcome: Progressing Goal: RH STG MAINTAIN SKIN INTEGRITY WITH ASSISTANCE Description: STG Maintain Skin Integrity With min Assistance. Outcome: Progressing Goal: RH STG ABLE TO PERFORM INCISION/WOUND CARE W/ASSISTANCE Description: STG Able To Perform Incision/Wound Care With min Assistance. Outcome: Progressing   Problem: RH SAFETY Goal: RH STG ADHERE TO SAFETY PRECAUTIONS W/ASSISTANCE/DEVICE Description: STG Adhere to Safety Precautions With cues/reminders Assistance/Device. Outcome: Progressing   Problem: RH PAIN MANAGEMENT Goal: RH STG PAIN MANAGED AT OR BELOW PT'S PAIN GOAL Description: Pain scale <3/10 Outcome: Progressing   Problem: RH KNOWLEDGE DEFICIT GENERAL Goal: RH STG INCREASE KNOWLEDGE OF SELF CARE AFTER HOSPITALIZATION Description:  Patient will have knowledge of illnesses & precautions to take against exacerbation with cues/reminders Outcome: Progressing

## 2020-06-15 NOTE — Progress Notes (Signed)
Occupational Therapy Session Note  Patient Details  Name: Todd Robertson MRN: [id number] Date of Birth: 25-Feb-1946  Today's Date: 06/15/2020 OT Individual Time: 1350-1500 OT Individual Time Calculation (min): 70 min   Short Term Goals: Week 1:  OT Short Term Goal 1 (Week 1): STG = LTG  Skilled Therapeutic Interventions/Progress Updates:    Pt greeted while sitting on toilet, in the middle of voiding B+B. Spouse [name] and daughter [name] present in the room. When pt was finished, he needed 2 seated rest breaks before hygiene was thoroughly completed by therapist. CGA for sit<stands and standing balance. Pt needed assistance with elevating pants post void as well. CGA for stand pivot<w/c without AD. He washed his hands while sitting at the sink and we discussed meaningful activity to complete during session. Pt agreeable to go outside. Escorted him via w/c to the outdoor patio. Explained and demonstrated diaphragmatic breathing technique to pt and family. Discussed implications when SOB increases during ADL routine and also when sleeping at night. Education also provided regarding forward posture breathing. While in the atrium area, pt practiced diaphragmatic breathing with instruction from therapist. To work on UB strengthening and endurance he also completed w/c mobility in the food court area. Note poor endurance with pt quickly requiring rest from activity. Pt was then returned to room via w/c. While OT removed adhesive glue from his arms, discussed home layout and d/c plans with family. Pt will need a TTB for home. At this time pt hopes he will not need a BSC for nighttime toileting, states that his bathroom is right next to his bedroom. Per spouse, living areas are both w/c and walker accessible, 1 bathroom may be w/c accessible. At end of session pt remained sitting in the w/c, left with all needs within reach and chair alarm set.   Also provided pt with an antinausea aromatherapy blend during  session. Pt premedicated for nausea.   02 sats on RA at rest and during activity remained between 94-97% throughout session  Therapy Documentation Precautions:  Precautions Precautions: Fall Restrictions Weight Bearing Restrictions: No Vital Signs: Therapy Vitals Temp: 99.1 F (37.3 C) Pulse Rate: (!) 102 Resp: 18 BP: 126/68 Patient Position (if appropriate): Lying Oxygen Therapy SpO2: 98 % O2 Device: Room Air Pain: no c/o pain during tx   ADL: ADL Eating: Set up Grooming: Setup Where Assessed-Grooming: Sitting at sink Upper Body Bathing: Minimal assistance Where Assessed-Upper Body Bathing: Sitting at sink Lower Body Bathing: Moderate assistance Where Assessed-Lower Body Bathing: Sitting at sink Upper Body Dressing: Minimal assistance Where Assessed-Upper Body Dressing: Sitting at sink Lower Body Dressing: Moderate assistance Where Assessed-Lower Body Dressing: Sitting at sink, Standing at sink Toilet Transfer: Minimal assistance Toilet Transfer Method: Ambulating :     Therapy/Group: Individual Therapy  Neyda Durango A Dayonna Selbe 06/15/2020, 3:56 PM

## 2020-06-16 ENCOUNTER — Inpatient Hospital Stay (HOSPITAL_COMMUNITY): Payer: Medicare Other

## 2020-06-16 ENCOUNTER — Inpatient Hospital Stay (HOSPITAL_COMMUNITY): Payer: Medicare Other | Admitting: Occupational Therapy

## 2020-06-16 LAB — GLUCOSE, CAPILLARY
Glucose-Capillary: 106 mg/dL — ABNORMAL HIGH (ref 70–99)
Glucose-Capillary: 109 mg/dL — ABNORMAL HIGH (ref 70–99)
Glucose-Capillary: 92 mg/dL (ref 70–99)
Glucose-Capillary: 100 mg/dL — ABNORMAL HIGH (ref 70–99)

## 2020-06-16 MED ORDER — LEVALBUTEROL HCL 0.63 MG/3ML IN NEBU: 0.6300 mg | INHALATION_SOLUTION | RESPIRATORY_TRACT | Status: DC | PRN

## 2020-06-16 NOTE — Plan of Care (Signed)
  Problem: Consults Goal: RH GENERAL PATIENT EDUCATION Description: See Patient Education module for education specifics. Outcome: Progressing Goal: Skin Care Protocol Initiated - if Braden Score 18 or less Description: If consults are not indicated, leave blank or document N/A Outcome: Progressing   Problem: RH BOWEL ELIMINATION Goal: RH STG MANAGE BOWEL WITH ASSISTANCE Description: STG Manage Bowel with mod I Assistance. Outcome: Progressing Goal: RH STG MANAGE BOWEL W/MEDICATION W/ASSISTANCE Description: STG Manage Bowel with Medication with mod I Assistance. Outcome: Progressing

## 2020-06-16 NOTE — Progress Notes (Signed)
  [facility] Progress Note   74 y.o. yo male with advanced CKD at baseline who was admitted on 06/10/2020 with need for nephrectomy   Assessment/ Plan:   1. RCC-  Need for nephrectomy - done on 7/9- c/b bleeding and progression to ESRD 2. ESRD - new diagnosis this admit-  HD initiated-  On a TTS schedule- next due later today-  Has TDC and AVG placed 7/30.    Will be [facility] on TTS schedule upon discharge  - Next HD tomorrow.  3. Anemia- hgb in the 7's-  On Darbe 200 weekly (last given 8/7) and s/p iron repletion 4. Secondary hyperparathyroidism- PTH 116, calc WNL-  Last phos high-  Have started phoslo for binding (phos down to 4.3 on 8/7) -> will recheck tomorrow. 5. HTN/volume-  req midodrine for BP support  Will inc to max dose as well as cortef.  Low dose metoprolol for Afib-  UF as able with HD 6. Malnutrition-  On supps 7. Dispo-   CIR but hope eventually to home with OP HD [facility] TTS  Subjective:   Feeling better; denies f/c/n/v or cramping during RRT.   Objective:   BP 116/67 (BP Location: Right Arm)   Pulse (!) 56   Temp 98.7 F (37.1 C)   Resp 18   Ht 6\' 2"  (1.88 m) Comment: stated  Wt 105.5 kg   SpO2 100%   BMI 29.86 kg/m   Intake/Output Summary (Last 24 hours) at 06/16/2020 1330 Last data filed at 06/16/2020 0900 Gross per 24 hour  Intake 564 ml  Output --  Net 564 ml   Weight change:   Physical Exam: General: obese, pleasant-  Minimal mobility- NAD Heart: RRR Lungs: mostly clear Abdomen: obese, soft, non tender Extremities: min edema Dialysis Access: left AVG-  thrill - right TDC   Imaging: No results found.  Labs: BMET Recent Labs  Lab 06/10/20 0307 06/12/20 1226 06/14/20 1248  NA 135 135 136  K 5.1 4.6 4.0  CL 97* 98 97*  CO2 21* 22 25  GLUCOSE 88 85 88  BUN 108* 69* 33*  CREATININE 11.56* 10.73* 7.34*  CALCIUM 8.1* 8.1* 8.3*  PHOS  --  7.2* 4.3   CBC Recent Labs  Lab 06/10/20 0307 06/12/20 1226  06/14/20 1248  WBC 7.8 8.9 10.0  HGB 8.0* 7.9* 8.5*  HCT 25.6* 26.5* 28.0*  MCV 93.4 95.7 94.9  PLT 283 283 220    Medications:    . arformoterol  15 mcg Nebulization BID  . budesonide (PULMICORT) nebulizer solution  0.5 mg Nebulization BID  . calcium acetate  1,334 mg Oral TID WC  . Chlorhexidine Gluconate Cloth  6 each Topical Q0600  . darbepoetin (ARANESP) injection - DIALYSIS  200 mcg Intravenous Q Sat-HD  . fluticasone  2 spray Each Nare Daily  . hydrocortisone  5 mg Oral BID WC  . insulin aspart  0-5 Units Subcutaneous QHS  . insulin aspart  0-9 Units Subcutaneous TID WC  . insulin detemir  7 Units Subcutaneous QHS  . mouth rinse  15 mL Mouth Rinse q12n4p  . metoprolol tartrate  12.5 mg Oral BID  . midodrine  10 mg Oral TID WC  . multivitamin  1 tablet Oral QHS      Otelia Santee, MD 06/16/2020, 1:30 PM

## 2020-06-16 NOTE — Progress Notes (Signed)
Physical Therapy Session Note  Patient Details  Name: Todd Robertson MRN: [id number] Date of Birth: 10-Jul-1946  Today's Date: 06/16/2020 PT Individual Time: 8828-0034 PT Individual Time Calculation (min): 65 min   Short Term Goals: Week 1:  PT Short Term Goal 1 (Week 1): None due to ELOS  Skilled Therapeutic Interventions/Progress Updates:     Patient in bed with his wife at bedside upon PT arrival. Patient alert and agreeable to PT session. Patient denied pain during session. Focused session on household mobility based on patient's desire to d/c asap, stated yesterday.   Patient reported feeling light headed throughout session, even at rest and in lying, with no change with position. Also, with significant SOB and fatigue with minimal mobility during session. Vitals: BP 118/73 in sitting and 115/70 after activity, HR 32-122 via monitor, 77 and 81 bpm and irregular in sitting and after activity. RN made aware and to discuss with PA. Reports this is not new for this patient. SPO2 95-100% throughout.   Therapeutic Activity: Bed Mobility: Patient performed supine to/from sit with supervision on hospital bed and ADL bed. Provided verbal cues for lying in the middle of the bed rather than close to the edge for safety. Transfers: Patient performed stand pivot x3 and sit to/from stand x1 with close supervision for safety with and without the RW. Provided verbal cues for pausing in standing to ensure his head was clear and it was safe to step away from the bed or w/c.  Gait Training:  Patient ambulated 20 feet using RW with close supervision for safety. Ambulated with decreased gait speed, decreased step length and height, mild forward trunk lean and downward head gaze. Provided verbal cues for paced breathing throughout, and errect posture for improved breath support.  Patient required significantly increased time for minimal activity due to symptoms listed above. Following ambulation patient began  coughing up mucus. Following 1 bout of coughing patient had difficulty taking a breath in. Guided patient through diaphragmatic breathing and clearing his throat with notable improvement in breathing, SPO2 96%. Patient then requesting to return to bed due to fatigue and SOB. Encouraged patient to use IS in the room every hour and elevated HOB in lying to improved expulsion of secretions.  Patient missed 10 min of skilled PT due to fatigue, RN made aware. Will attempt to make-up missed time as able.    Patient in bed with his wife at bedside at end of session with breaks locked, bed alarm set, and all needs within reach.    Therapy Documentation Precautions:  Precautions Precautions: Fall Restrictions Weight Bearing Restrictions: No General: PT Amount of Missed Time (min): 10 Minutes PT Missed Treatment Reason: Patient fatigue    Therapy/Group: Individual Therapy  Jenea Dake L Guy Seese PT, DPT  06/16/2020, 4:46 PM

## 2020-06-16 NOTE — Progress Notes (Signed)
Occupational Therapy Session Note  Patient Details  Name: Todd Robertson MRN: [id number] Date of Birth: Mar 31, 1946  Today's Date: 06/16/2020 OT Individual Time: 8757-9728 OT Individual Time Calculation (min): 58 min   Short Term Goals: Week 1:  OT Short Term Goal 1 (Week 1): STG = LTG  Skilled Therapeutic Interventions/Progress Updates:    Pt greeted in bed with no c/o pain. Requesting to participate in bathing/dressing tasks during session. Started with LB bathing/dressing bedlevel per pt request. Pt able to utilize reclined figure 4 position to wash his lower legs with Min A. Max A for perihygiene in the back with pt rolling Rt>Lt with Min A. Total A for brief, Teds and gripper socks, Max A to thread LEs into pants where pt then assisted with elevating pants over hips using bridge technique. Supine<sit completed with increased time and HOB slightly raised. CGA for stand pivot<w/c without AD, bed also raised at this time. He resumed with UB bathing/dressing and oral care tasks while sitting at the sink with setup. Note that he required increased time and initiated several rest breaks due to profoundly limited endurance. 02 sats during sinkside activity 94-97% on RA. At end of session pt remained sitting up in the w/c with chair alarm set, agreeable to remain sitting up to eat his breakfast. Left him with call bell within reach. Tx focus placed on activity tolerance/OOB tolerance, ADL retraining, and engagement in functional activity while maintaining stable vitals.    Therapy Documentation Precautions:  Precautions Precautions: Fall Restrictions Weight Bearing Restrictions: No Vital Signs: Therapy Vitals Pulse Rate: (!) 59 BP: (!) 105/52 ADL: ADL Eating: Set up Grooming: Setup Where Assessed-Grooming: Sitting at sink Upper Body Bathing: Minimal assistance Where Assessed-Upper Body Bathing: Sitting at sink Lower Body Bathing: Moderate assistance Where Assessed-Lower Body Bathing:  Sitting at sink Upper Body Dressing: Minimal assistance Where Assessed-Upper Body Dressing: Sitting at sink Lower Body Dressing: Moderate assistance Where Assessed-Lower Body Dressing: Sitting at sink, Standing at sink Toilet Transfer: Minimal assistance Toilet Transfer Method: Ambulating      Therapy/Group: Individual Therapy  Shermon Bozzi A Zyron Deeley 06/16/2020, 12:26 PM

## 2020-06-16 NOTE — Progress Notes (Signed)
Occupational Therapy Session Note  Patient Details  Name: Todd Robertson MRN: [id number] Date of Birth: [birth year]  Today's Date: 06/16/2020 OT Individual Time: 1100-1159 OT Individual Time Calculation (min): 59 min    Short Term Goals: Week 1:  OT Short Term Goal 1 (Week 1): STG = LTG  Skilled Therapeutic Interventions/Progress Updates:    Patient seated in w/c, denies pain but states that he is feeling nausea and fatigue at this time.  Assisted to therapy gym via w/c.  SPT with RW to/from mat with CGA.  He tolerated unsupported sitting for 25 minutes with focus on posture, deep breathing techniques, trunk mobility, UB/LB AROM - he requires rest breaks and c/o SOB at times (O2 sat 100% with spot check just following activity).   Completed UB ergometer:  2 min, 45 sec, 1 min.   Set up for ambulation but patient states that he is feeling light headed.  BP 128/62.  Patient requested to attempt short walk - ambulation CGA/CS with RW x 34 feet and 2nd walk 45 feet with w/c follow.  He returned to bed at close of session, bed alarm set and call bell in reach, wife present at close of session.    Therapy Documentation Precautions:  Precautions Precautions: Fall Restrictions Weight Bearing Restrictions: No   Therapy/Group: Individual Therapy  Carlos Levering 06/16/2020, 7:54 AM

## 2020-06-16 NOTE — Progress Notes (Signed)
[facility] PHYSICAL MEDICINE & REHABILITATION PROGRESS NOTE   Subjective/Complaints:  Pt reports he's breathing better since had resp treatment at 0400 this AM0 but not quite back to normal-  Nursing asking for O2 prn order due to increased struggle to breathe as day goes on.   Pt admits to a "little cough".   Says bowels are doing "somewhat better".    Pt denies SOB, abd pain, CP, N/V/C/D, and vision changes  Objective:   No results found. Recent Labs    06/14/20 1248  WBC 10.0  HGB 8.5*  HCT 28.0*  PLT 220   Recent Labs    06/14/20 1248  NA 136  K 4.0  CL 97*  CO2 25  GLUCOSE 88  BUN 33*  CREATININE 7.34*  CALCIUM 8.3*    Intake/Output Summary (Last 24 hours) at 06/16/2020 1942 Last data filed at 06/16/2020 1835 Gross per 24 hour  Intake 684 ml  Output --  Net 684 ml     Physical Exam: Vital Signs Blood pressure 127/72, pulse 71, temperature 99.2 F (37.3 C), temperature source Oral, resp. rate 18, height 6\' 2"  (1.88 m), weight 105.5 kg, SpO2 93 %.    General: No acute distress- laying in bed- appropriate, NAD Mood and affect are appropriate Heart: if irregular rhythm, is regular rate Lungs: slightly using accessory muscles- very mild- good abdominal breathing- good air movement - coarse breath sounds Abdomen: Soft, NT, ND, (+)BS  Extremities: No clubbing, cyanosis, or edema Skin: No evidence of breakdown, no evidence of rash, incision LUE CDI   Neurologicmotor strength is 5/5 in bilateral deltoid, bicep, tricep, grip, hip flexor, knee extensors, ankle dorsiflexor and plantar flexor   Musculoskeletal: Full range of motion in all 4 extremities. No joint swelling    Assessment/Plan: 1. Functional deficits secondary to debility renal cell carcinoma which require 3+ hours per day of interdisciplinary therapy in a comprehensive inpatient rehab setting.  Physiatrist is providing close team supervision and 24 hour management of active medical problems  listed below.  Physiatrist and rehab team continue to assess barriers to discharge/monitor patient progress toward functional and medical goals  Care Tool:  Bathing    Body parts bathed by patient: Right arm, Left arm, Chest, Abdomen, Front perineal area, Face, Right upper leg, Left upper leg   Body parts bathed by helper: Buttocks, Right lower leg, Left lower leg Body parts n/a: Front perineal area, Buttocks, Right upper leg, Left upper leg, Right lower leg, Left lower leg   Bathing assist Assist Level: Moderate Assistance - Patient 50 - 74%     Upper Body Dressing/Undressing Upper body dressing   What is the patient wearing?: Pull over shirt    Upper body assist Assist Level: Set up assist    Lower Body Dressing/Undressing Lower body dressing      What is the patient wearing?: Incontinence brief, Pants     Lower body assist Assist for lower body dressing: Maximal Assistance - Patient 25 - 49%     Toileting Toileting    Toileting assist Assist for toileting: Moderate Assistance - Patient 50 - 74%     Transfers Chair/bed transfer  Transfers assist     Chair/bed transfer assist level: Supervision/Verbal cueing Chair/bed transfer assistive device: Programmer, multimedia   Ambulation assist      Assist level: Supervision/Verbal cueing Assistive device: Walker-rolling Max distance: 20 ft   Walk 10 feet activity   Assist     Assist level: Supervision/Verbal  cueing Assistive device: Walker-rolling   Walk 50 feet activity   Assist Walk 50 feet with 2 turns activity did not occur: Safety/medical concerns         Walk 150 feet activity   Assist Walk 150 feet activity did not occur: Safety/medical concerns         Walk 10 feet on uneven surface  activity   Assist Walk 10 feet on uneven surfaces activity did not occur: Safety/medical concerns         Wheelchair     Assist Will patient use wheelchair at discharge?: No    Wheelchair activity did not occur: Safety/medical concerns         Wheelchair 50 feet with 2 turns activity    Assist    Wheelchair 50 feet with 2 turns activity did not occur: Safety/medical concerns       Wheelchair 150 feet activity     Assist  Wheelchair 150 feet activity did not occur: Safety/medical concerns       Blood pressure 127/72, pulse 71, temperature 99.2 F (37.3 C), temperature source Oral, resp. rate 18, height 6\' 2"  (1.88 m), weight 105.5 kg, SpO2 93 %.  Medical Problem List and Plan: 1.Functional and mobility deficitssecondary to debility after right nephrectomy and multiple post-op medical complications -patient may shower -ELOS/Goals: 5-7 days, mod I CIR PT< OT  2. Antithrombotics: -DVT/anticoagulation:Mechanical:Sequential compression devices, below kneeBilateral lower extremities -antiplatelet therapy: N/A 3. Pain Management:Tylenol prn 4. Mood:LCSW to follow for evaluation and support. -antipsychotic agents: N/A 5. Neuropsych: This patientiscapable of making decisions onhisown behalf. 6. Skin/Wound Care:Monitor wound for healing. Protein supplements to promote healing. 7. Fluids/Electrolytes/Nutrition:Change to Carb modified/renal diet--not on fluid restriction at this time. Monitor strict I/O as well as daily weights.  9. Persistent Hypotension: -patient asymptomatic. On midodrine (decreased to 5 mg tid on 08/03) as well as steroids (decreased to 10 mg bid on 08/02)--recommendations to wean as tolerated.  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 HD TTS--will schedule at the end of the day to help with tolerance of therapy.  11. ABLA:Serial labs with HD. On areanesp weekly and s/p iron repletion. Improving 12. New onset A fib with RVR: Monitor HR tid--on low dose metoprolol bid.  Vitals:   06/16/20 1601 06/16/20 1941  BP: 136/67 127/72  Pulse: 94 71  Resp:  18  Temp:  99.2 F  (37.3 C)  SpO2: 95% 93%  8/9- rate controlled- BP OK with cortef and Midodrine 13. H/o Asthma: Continue pulmicort, brovana and Xopenex nebs  8/9- appeared more uncomfortable- will increase xopenex to q4 hours prn and added O2 prn.   14. T2DM diet controlled: Hgb A1C- 6.2 and controlled. Will monitor BS ac/hs and use SSI for elevated BS--likely due to stress and steroids.  and wean as able.  CBG (last 3)  Recent Labs    06/16/20 0613 06/16/20 1216 06/16/20 1626  GLUCAP 106* 109* 92  8/9- BGs 92-109- doing better -con't regimen 15.  Diarrhea no laxative use , only 1 liquid stool thus far, no recent abx, no other meds suspected, If liquid stools cont would check C diff, will check KUB an dkeep on clear liq for now   8/9- per pt, is doing better-  LOS: 6 days A FACE TO FACE EVALUATION WAS PERFORMED  Greenley Martone 06/16/2020, 7:42 PM

## 2020-06-17 ENCOUNTER — Inpatient Hospital Stay (HOSPITAL_COMMUNITY): Payer: Medicare Other

## 2020-06-17 ENCOUNTER — Inpatient Hospital Stay (HOSPITAL_COMMUNITY): Payer: Medicare Other | Admitting: Occupational Therapy

## 2020-06-17 LAB — RENAL FUNCTION PANEL
Albumin: 2.5 g/dL — ABNORMAL LOW (ref 3.5–5.0)
Anion gap: 16 — ABNORMAL HIGH (ref 5–15)
BUN: 52 mg/dL — ABNORMAL HIGH (ref 8–23)
CO2: 23 mmol/L (ref 22–32)
Calcium: 8.4 mg/dL — ABNORMAL LOW (ref 8.9–10.3)
Chloride: 95 mmol/L — ABNORMAL LOW (ref 98–111)
Creatinine, Ser: 11.22 mg/dL — ABNORMAL HIGH (ref 0.61–1.24)
GFR calc Af Amer: 5 mL/min — ABNORMAL LOW (ref >60)
GFR calc non Af Amer: 4 mL/min — ABNORMAL LOW (ref >60)
Glucose, Bld: 124 mg/dL — ABNORMAL HIGH (ref 70–99)
Phosphorus: 6.2 mg/dL — ABNORMAL HIGH (ref 2.5–4.6)
Potassium: 4.7 mmol/L (ref 3.5–5.1)
Sodium: 134 mmol/L — ABNORMAL LOW (ref 135–145)

## 2020-06-17 LAB — CBC
HCT: 27.8 % — ABNORMAL LOW (ref 39.0–52.0)
Hemoglobin: 8.3 g/dL — ABNORMAL LOW (ref 13.0–17.0)
MCH: 29.2 pg (ref 26.0–34.0)
MCHC: 29.9 g/dL — ABNORMAL LOW (ref 30.0–36.0)
MCV: 97.9 fL (ref 80.0–100.0)
Platelets: 197 10*3/uL (ref 150–400)
RBC: 2.84 MIL/uL — ABNORMAL LOW (ref 4.22–5.81)
RDW: 17.8 % — ABNORMAL HIGH (ref 11.5–15.5)
WBC: 14.5 10*3/uL — ABNORMAL HIGH (ref 4.0–10.5)
nRBC: 0.1 % (ref 0.0–0.2)

## 2020-06-17 LAB — GLUCOSE, CAPILLARY
Glucose-Capillary: 98 mg/dL (ref 70–99)
Glucose-Capillary: 119 mg/dL — ABNORMAL HIGH (ref 70–99)
Glucose-Capillary: 87 mg/dL (ref 70–99)
Glucose-Capillary: 102 mg/dL — ABNORMAL HIGH (ref 70–99)

## 2020-06-17 MED ORDER — ATORVASTATIN CALCIUM 40 MG PO TABS
40.0000 mg | ORAL_TABLET | Freq: Every day | ORAL | Status: DC
  Administered 2020-06-18 – 2020-06-25 (×8): 40 mg via ORAL
  Filled 2020-06-17 (×9): qty 1

## 2020-06-17 MED ORDER — HEPARIN SODIUM (PORCINE) 1000 UNIT/ML IJ SOLN
INTRAMUSCULAR | Status: AC
  Administered 2020-06-17: 3800 [IU] via INTRAVENOUS_CENTRAL
  Filled 2020-06-17: qty 4

## 2020-06-17 MED ORDER — PSYLLIUM 95 % PO PACK
1.0000 | PACK | Freq: Every day | ORAL | Status: DC
  Administered 2020-06-17 – 2020-06-19 (×2): 1 via ORAL
  Filled 2020-06-17 (×10): qty 1

## 2020-06-17 MED ORDER — ALBUMIN HUMAN 25 % IV SOLN
INTRAVENOUS | Status: AC
  Filled 2020-06-17: qty 100

## 2020-06-17 MED ORDER — ALBUMIN HUMAN 25 % IV SOLN
25.0000 g | Freq: Once | INTRAVENOUS | Status: AC
  Administered 2020-06-17: 25 g via INTRAVENOUS

## 2020-06-17 NOTE — Progress Notes (Signed)
Occupational Therapy Session Note  Patient Details  Name: Todd Robertson MRN: [id number] Date of Birth: 03-18-1946  Today's Date: 06/17/2020 OT Individual Time: 1100-1155 OT Individual Time Calculation (min): 55 min    Short Term Goals: Week 1:  OT Short Term Goal 1 (Week 1): STG = LTG  Skilled Therapeutic Interventions/Progress Updates:    patient with PT at start of session, completing toileting activity - he denies pain but c/o fatigue.  Min A of 2 to stand from low toilet surface, max A for hygiene, washing buttocks.  Short distance ambulation with RW from toilet to w/c CGA.  Completed grooming, UB bathing and dressing w/c level at sink with set up.   LB dressing max A for incontinence brief, CS for shorts over feet, CS to stand but requires max A for brief and pants over hips.  Short distance ambulation to bed with CGA.  Sit to supine CS.   Completed incentive spirometer - good technique noted.  Completed UB AROM activities in supine position with good tolerance, brief rest between sets.  He remained in bed at close of session, bed alarm set and call bell in reach.     Therapy Documentation Precautions:  Precautions Precautions: Fall Restrictions Weight Bearing Restrictions: No  Therapy/Group: Individual Therapy  Carlos Levering 06/17/2020, 7:39 AM

## 2020-06-17 NOTE — Progress Notes (Signed)
Physical Therapy Session Note  Patient Details  Name: Todd Robertson MRN: [id number] Date of Birth: 08-Sep-1946  Today's Date: 06/17/2020 PT Individual Time: 1000-1100 PT Individual Time Calculation (min): 60 min   Short Term Goals: Week 1:  PT Short Term Goal 1 (Week 1): None due to ELOS Week 2:    Week 3:     Skilled Therapeutic Interventions/Progress Updates:   FIRST SESSION PAIN denies pain Pt initially supine, dressed, agreeable to session. Requesting use of BR. BP supine 96/69, HR 92 Reviewed diaphragmatic breathing/"belly breathing" w/patient and practiced this during BP check  Pt supine to sit w/supervision.   BP ;in sitting 103/90, hr 101 Pt sat x 3-4 min, reviewed importance of allowing BP to adjust to changes in position. STS w/cga and gait 66ft to commode, spt to commode w/cga, assist required to lower pants. Pt continent of bowels//loose stool.  STS from commode w/cga and dependent for perineal hygiene and raising brief/pants in standing. Gait 30ft to wc w/cga to close supervisi8on.  Pt transported to gym for continued session. STS at hi/lo table and performed the following: Sidestepping 2 steps L/R x 3 Seated Rest x 3-4 min 02 sats 99, hr 111 Mini squats x 6 Seated Rest x  5-6 min BP 95/69, hr 101 Standing Hs curls x 5 each.  Gait 22 Ft w/rw w/wc follow due to fatigue, close supervision.overall flexed posture.  Pt requires frequent extended rest breaks due to very limited endurance/SOB.  No issues w/OH this session.   At end of session, pt transported to room and Pt left oob in wc w/alarm belt set and needs in reach, MD in to see pt.   SECOND SESSION PAIN denies pain  Pt initially oob in wc and agreeable to session.  Transported to gym for the following:  STS and Step ups on 3in step using 2 rails x 3 reps w/close supervision.  Performed in prep for negotiating threshold at home entry.  Fatigues w/3reps.   Gertie Exon at 90cm/sec performed for  cardiovascular conditioning, RPE 7/10 5min 45sec rst x 61min 2.5 min Rest x 1.66min 2.53min  Gait: sts and gait 21ft w/RW including turning at doorway to enter room, close supervision and wc follow at pt request due to fatigue.  Commode transfer w/RW, use of grab bars to lower, use of walker for STS from low commode w/min to mod assist due to commode ht without elevated BSC in place.    In sitting pt able to remove shirt independently, pants/brief w/max assist from therapist due to fatigue.  Pt handed off to OT for continuation of bathing/dressing. Therapy Documentation Precautions:  Precautions Precautions: Fall Restrictions Weight Bearing Restrictions: No    Therapy/Group: Individual Therapy  Callie Fielding, [place] 06/17/2020, 12:43 PM

## 2020-06-17 NOTE — Progress Notes (Signed)
[facility] PHYSICAL MEDICINE & REHABILITATION PROGRESS NOTE   Subjective/Complaints:  Patient still having loose stools but otherwise no complaints.  Fatigues easily in physical therapy per PT   Pt denies SOB, abd pain, CP, N/V/C/D, Objective:   No results found. Recent Labs    06/14/20 1248  WBC 10.0  HGB 8.5*  HCT 28.0*  PLT 220   Recent Labs    06/14/20 1248  NA 136  K 4.0  CL 97*  CO2 25  GLUCOSE 88  BUN 33*  CREATININE 7.34*  CALCIUM 8.3*    Intake/Output Summary (Last 24 hours) at 06/17/2020 0819 Last data filed at 06/16/2020 1835 Gross per 24 hour  Intake 564 ml  Output --  Net 564 ml     Physical Exam: Vital Signs Blood pressure (!) 85/52, pulse 87, temperature 99.1 F (37.3 C), resp. rate 16, height 6\' 2"  (1.88 m), weight 105.4 kg, SpO2 94 %.    General: No acute distress Mood and affect are appropriate Heart: Regular rate and rhythm no rubs murmurs or extra sounds Lungs: Clear to auscultation, breathing unlabored, no rales or wheezes Abdomen: Positive bowel sounds, soft nontender to palpation, nondistended Extremities: No clubbing, cyanosis, or edema Skin: No evidence of breakdown, no evidence of rash   Neurologicmotor strength is 5/5 in bilateral deltoid, bicep, tricep, grip, hip flexor, knee extensors, ankle dorsiflexor and plantar flexor   Musculoskeletal: Full range of motion in all 4 extremities. No joint swelling    Assessment/Plan: 1. Functional deficits secondary to debility renal cell carcinoma which require 3+ hours per day of interdisciplinary therapy in a comprehensive inpatient rehab setting.  Physiatrist is providing close team supervision and 24 hour management of active medical problems listed below.  Physiatrist and rehab team continue to assess barriers to discharge/monitor patient progress toward functional and medical goals  Care Tool:  Bathing    Body parts bathed by patient: Right arm, Left arm, Chest, Abdomen,  Front perineal area, Face, Right upper leg, Left upper leg   Body parts bathed by helper: Buttocks, Right lower leg, Left lower leg Body parts n/a: Front perineal area, Buttocks, Right upper leg, Left upper leg, Right lower leg, Left lower leg   Bathing assist Assist Level: Moderate Assistance - Patient 50 - 74%     Upper Body Dressing/Undressing Upper body dressing   What is the patient wearing?: Pull over shirt    Upper body assist Assist Level: Set up assist    Lower Body Dressing/Undressing Lower body dressing      What is the patient wearing?: Incontinence brief, Pants     Lower body assist Assist for lower body dressing: Maximal Assistance - Patient 25 - 49%     Toileting Toileting    Toileting assist Assist for toileting: Moderate Assistance - Patient 50 - 74%     Transfers Chair/bed transfer  Transfers assist     Chair/bed transfer assist level: Supervision/Verbal cueing Chair/bed transfer assistive device: Programmer, multimedia   Ambulation assist      Assist level: Supervision/Verbal cueing Assistive device: Walker-rolling Max distance: 20 ft   Walk 10 feet activity   Assist     Assist level: Supervision/Verbal cueing Assistive device: Walker-rolling   Walk 50 feet activity   Assist Walk 50 feet with 2 turns activity did not occur: Safety/medical concerns         Walk 150 feet activity   Assist Walk 150 feet activity did not occur: Safety/medical concerns  Walk 10 feet on uneven surface  activity   Assist Walk 10 feet on uneven surfaces activity did not occur: Safety/medical concerns         Wheelchair     Assist Will patient use wheelchair at discharge?: No   Wheelchair activity did not occur: Safety/medical concerns         Wheelchair 50 feet with 2 turns activity    Assist    Wheelchair 50 feet with 2 turns activity did not occur: Safety/medical concerns       Wheelchair 150  feet activity     Assist  Wheelchair 150 feet activity did not occur: Safety/medical concerns       Blood pressure (!) 85/52, pulse 87, temperature 99.1 F (37.3 C), resp. rate 16, height 6\' 2"  (1.88 m), weight 105.4 kg, SpO2 94 %.  Medical Problem List and Plan: 1.Functional and mobility deficitssecondary to debility after right nephrectomy and multiple post-op medical complications -patient may shower -ELOS/Goals: 5-7 days, mod I Team conf in am cont CIR PT, OT 2. Antithrombotics: -DVT/anticoagulation:Mechanical:Sequential compression devices, below kneeBilateral lower extremities -antiplatelet therapy: N/A 3. Pain Management:Tylenol prn 4. Mood:LCSW to follow for evaluation and support. -antipsychotic agents: N/A 5. Neuropsych: This patientiscapable of making decisions onhisown behalf. 6. Skin/Wound Care:Monitor wound for healing. Protein supplements to promote healing. 7. Fluids/Electrolytes/Nutrition:Change to Carb modified/renal diet--not on fluid restriction at this time. Monitor strict I/O as well as daily weights.  9. Persistent Hypotension: -patient asymptomatic. On midodrine (decreased to 5 mg tid on 08/03) as well as steroids (decreased to 10 mg bid on 08/02)--recommendations to wean as tolerated.  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 HD TTS--will schedule at the end of the day to help with tolerance of therapy.  11. ABLA:Serial labs with HD. On areanesp weekly and s/p iron repletion. Improving 12. New onset A fib with RVR: Monitor HR tid--on low dose metoprolol bid.  Vitals:   06/16/20 2124 06/17/20 0609  BP: 110/63 (!) 85/52  Pulse: 96 87  Resp:  16  Temp:  99.1 F (37.3 C)  SpO2: 95% 94%  Still low following hemodialysis, continue Cortef and midodrine 13. H/o Asthma: Continue pulmicort, brovana and Xopenex nebs  8/9- appeared more uncomfortable- will increase xopenex to q4 hours prn and added O2  prn.   14. T2DM diet controlled: Hgb A1C- 6.2 and controlled. Will monitor BS ac/hs and use SSI for elevated BS--likely due to stress and steroids.  and wean as able.  CBG (last 3)  Recent Labs    06/16/20 1626 06/16/20 2122 06/17/20 0619  GLUCAP 92 100* 98  8/9- BGs 92-109- doing better -con't regimen 15.  Diarrhea no laxative use , only 1 liquid stool thus far, no recent abx, no other meds suspected, If liquid stools cont would check C diff, will check KUB an dkeep on clear liq for now   8/9- per pt, is doing better-  LOS: 7 days A FACE TO Lilly E Todd Robertson 06/17/2020, 8:19 AM

## 2020-06-17 NOTE — Progress Notes (Signed)
.    [facility] Progress Note   74 y.o.yo malewith advanced CKD at baseline who was admitted on 8/3/2021with need for nephrectomy   Assessment/ Plan:   1. RCC- Need for nephrectomy - done on 7/9- c/b bleeding and progression to ESRD 2. 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 Will be [facility] on TTS schedule upon discharge  - Next HD today .  3. Anemia-hgb in the 7's- On Darbe 200 weekly (last given 8/7) and s/p iron repletion 4. Secondary hyperparathyroidism- PTH 116, calc WNL- Last phos high- Have startedphoslo for binding (phos down to 4.3 on 8/7) -> will recheck tomorrow. 5. HTN/volume- req midodrine for BP support Will inc to max dose as well as cortef. Low dose metoprolol for Afib- UF as able with HD 6. Malnutrition-On supps 7. Dispo- CIR but hope eventually to home with OP HD [facility] TTS  Subjective:   Feeling better; denies f/c/n/v or cramping during RRT. No events overnight Working with PT; able to transfer to chair   Objective:   BP (!) 87/59 (BP Location: Right Arm)   Pulse 67   Temp 99.1 F (37.3 C)   Resp 16   Ht 6\' 2"  (1.88 m) Comment: stated  Wt 105.4 kg   SpO2 96%   BMI 29.83 kg/m   Intake/Output Summary (Last 24 hours) at 06/17/2020 1109 Last data filed at 06/17/2020 0800 Gross per 24 hour  Intake 564 ml  Output --  Net 564 ml   Weight change:   Physical Exam: General: obese, pleasant- Minimal mobility- NAD Heart: RRR Lungs: mostly clear Abdomen: obese, soft, non tender Extremities: min edema Dialysis Access: left AVG  - right TDC   Imaging: No results found.  Labs: BMET Recent Labs  Lab 06/12/20 1226 06/14/20 1248  NA 135 136  K 4.6 4.0  CL 98 97*  CO2 22 25  GLUCOSE 85 88  BUN 69* 33*  CREATININE 10.73* 7.34*  CALCIUM 8.1* 8.3*  PHOS 7.2* 4.3   CBC Recent Labs  Lab 06/12/20 1226 06/14/20 1248   WBC 8.9 10.0  HGB 7.9* 8.5*  HCT 26.5* 28.0*  MCV 95.7 94.9  PLT 283 220    Medications:    . arformoterol  15 mcg Nebulization BID  . atorvastatin  40 mg Oral Daily  . budesonide (PULMICORT) nebulizer solution  0.5 mg Nebulization BID  . calcium acetate  1,334 mg Oral TID WC  . Chlorhexidine Gluconate Cloth  6 each Topical Q0600  . darbepoetin (ARANESP) injection - DIALYSIS  200 mcg Intravenous Q Sat-HD  . fluticasone  2 spray Each Nare Daily  . hydrocortisone  5 mg Oral BID WC  . insulin aspart  0-5 Units Subcutaneous QHS  . insulin aspart  0-9 Units Subcutaneous TID WC  . insulin detemir  7 Units Subcutaneous QHS  . mouth rinse  15 mL Mouth Rinse q12n4p  . metoprolol tartrate  12.5 mg Oral BID  . midodrine  10 mg Oral TID WC  . multivitamin  1 tablet Oral QHS  . psyllium  1 packet Oral Daily      Otelia Santee, MD 06/17/2020, 11:09 AM

## 2020-06-17 NOTE — Plan of Care (Signed)
  Problem: Consults Goal: RH GENERAL PATIENT EDUCATION Description: See Patient Education module for education specifics. Outcome: Progressing Goal: Skin Care Protocol Initiated - if Braden Score 18 or less Description: If consults are not indicated, leave blank or document N/A Outcome: Progressing Goal: Nutrition Consult-if indicated Outcome: Progressing Goal: Diabetes Guidelines if Diabetic/Glucose > 140 Description: If diabetic or lab glucose is > 140 mg/dl - Initiate Diabetes/Hyperglycemia Guidelines & Document Interventions  Outcome: Progressing   Problem: RH BOWEL ELIMINATION Goal: RH STG MANAGE BOWEL WITH ASSISTANCE Description: STG Manage Bowel with mod I Assistance. Outcome: Progressing Goal: RH STG MANAGE BOWEL W/MEDICATION W/ASSISTANCE Description: STG Manage Bowel with Medication with mod I Assistance. Outcome: Progressing   Problem: RH BLADDER ELIMINATION Goal: RH STG MANAGE BLADDER WITH ASSISTANCE Description: STG Manage Bladder With min Assistance Outcome: Progressing   Problem: RH SKIN INTEGRITY Goal: RH STG SKIN FREE OF INFECTION/BREAKDOWN Description: Will stay free of infection or new pressure areas  Outcome: Progressing Goal: RH STG MAINTAIN SKIN INTEGRITY WITH ASSISTANCE Description: STG Maintain Skin Integrity With min Assistance. Outcome: Progressing Goal: RH STG ABLE TO PERFORM INCISION/WOUND CARE W/ASSISTANCE Description: STG Able To Perform Incision/Wound Care With min Assistance. Outcome: Progressing   Problem: RH SAFETY Goal: RH STG ADHERE TO SAFETY PRECAUTIONS W/ASSISTANCE/DEVICE Description: STG Adhere to Safety Precautions With cues/reminders Assistance/Device. Outcome: Progressing   Problem: RH PAIN MANAGEMENT Goal: RH STG PAIN MANAGED AT OR BELOW PT'S PAIN GOAL Description: Pain scale <3/10 Outcome: Progressing   Problem: RH KNOWLEDGE DEFICIT GENERAL Goal: RH STG INCREASE KNOWLEDGE OF SELF CARE AFTER HOSPITALIZATION Description:  Patient will have knowledge of illnesses & precautions to take against exacerbation with cues/reminders Outcome: Progressing

## 2020-06-18 ENCOUNTER — Inpatient Hospital Stay (HOSPITAL_COMMUNITY): Payer: Medicare Other | Admitting: Physical Therapy

## 2020-06-18 ENCOUNTER — Inpatient Hospital Stay (HOSPITAL_COMMUNITY): Payer: Medicare Other | Admitting: Occupational Therapy

## 2020-06-18 ENCOUNTER — Inpatient Hospital Stay (HOSPITAL_COMMUNITY): Payer: Medicare Other

## 2020-06-18 LAB — GLUCOSE, CAPILLARY
Glucose-Capillary: 96 mg/dL (ref 70–99)
Glucose-Capillary: 117 mg/dL — ABNORMAL HIGH (ref 70–99)
Glucose-Capillary: 99 mg/dL (ref 70–99)
Glucose-Capillary: 98 mg/dL (ref 70–99)

## 2020-06-18 MED ORDER — CALCIUM ACETATE (PHOS BINDER) 667 MG PO CAPS
2001.0000 mg | ORAL_CAPSULE | Freq: Three times a day (TID) | ORAL | Status: DC
  Administered 2020-06-18 – 2020-06-25 (×20): 2001 mg via ORAL
  Filled 2020-06-18 (×24): qty 3

## 2020-06-18 MED ORDER — HYDROCORTISONE 10 MG PO TABS
10.0000 mg | ORAL_TABLET | Freq: Two times a day (BID) | ORAL | Status: DC
  Administered 2020-06-18 – 2020-06-25 (×14): 10 mg via ORAL
  Filled 2020-06-18 (×14): qty 1

## 2020-06-18 NOTE — Progress Notes (Signed)
Patient ID: Todd Robertson, male   DOB: 1946-06-29, 74 y.o.   MRN: [id number]   Team Conference Report to Patient/Family  Team Conference discussion was reviewed with the patient and caregiver, including goals, any changes in plan of care and target discharge date.  Patient and caregiver express understanding and are in agreement.  The patient has a target discharge date of 06/26/20.  Dyanne Iha 06/18/2020, 2:26 PM

## 2020-06-18 NOTE — Progress Notes (Signed)
[facility] PHYSICAL MEDICINE & REHABILITATION PROGRESS NOTE   Subjective/Complaints:  No issues overnite, fatigues easily but no dizziness   Pt denies SOB, abd pain, CP, N/V/C/D, Objective:   No results found. Recent Labs    06/17/20 1344  WBC 14.5*  HGB 8.3*  HCT 27.8*  PLT 197   Recent Labs    06/17/20 1344  NA 134*  K 4.7  CL 95*  CO2 23  GLUCOSE 124*  BUN 52*  CREATININE 11.22*  CALCIUM 8.4*    Intake/Output Summary (Last 24 hours) at 06/18/2020 0826 Last data filed at 06/18/2020 0731 Gross per 24 hour  Intake 622 ml  Output 490 ml  Net 132 ml     Physical Exam: Vital Signs Blood pressure 98/65, pulse 84, temperature 100.1 F (37.8 C), temperature source Oral, resp. rate 16, height 6' 2"  (1.88 m), weight 106.3 kg, SpO2 (!) 88 %.  General: No acute distress Mood and affect are appropriate Heart: Regular rate and rhythm no rubs murmurs or extra sounds Lungs: Clear to auscultation, breathing unlabored, no rales or wheezes Abdomen: Positive bowel sounds, soft nontender to palpation, nondistended Extremities: No clubbing, cyanosis, or edema Skin: No evidence of breakdown, no evidence of rash  Neurologicmotor strength is5/5 in bilateral deltoid, bicep, tricep, grip, hip flexor, knee extensors, ankle dorsiflexor and plantar flexor   Musculoskeletal: Full range of motion in all 4 extremities. No joint swelling    Assessment/Plan: 1. Functional deficits secondary to debility renal cell carcinoma which require 3+ hours per day of interdisciplinary therapy in a comprehensive inpatient rehab setting.  Physiatrist is providing close team supervision and 24 hour management of active medical problems listed below.  Physiatrist and rehab team continue to assess barriers to discharge/monitor patient progress toward functional and medical goals  Care Tool:  Bathing    Body parts bathed by patient: Right arm, Left arm, Chest, Abdomen, Front perineal area, Face,  Right upper leg, Left upper leg   Body parts bathed by helper: Buttocks, Right lower leg, Left lower leg Body parts n/a: Front perineal area, Buttocks, Right upper leg, Left upper leg, Right lower leg, Left lower leg   Bathing assist Assist Level: Moderate Assistance - Patient 50 - 74%     Upper Body Dressing/Undressing Upper body dressing   What is the patient wearing?: Pull over shirt    Upper body assist Assist Level: Set up assist    Lower Body Dressing/Undressing Lower body dressing      What is the patient wearing?: Incontinence brief, Pants     Lower body assist Assist for lower body dressing: Moderate Assistance - Patient 50 - 74%     Toileting Toileting    Toileting assist Assist for toileting: Moderate Assistance - Patient 50 - 74%     Transfers Chair/bed transfer  Transfers assist     Chair/bed transfer assist level: Supervision/Verbal cueing Chair/bed transfer assistive device: Programmer, multimedia   Ambulation assist      Assist level: Supervision/Verbal cueing Assistive device: Walker-rolling Max distance: 30   Walk 10 feet activity   Assist     Assist level: Supervision/Verbal cueing Assistive device: Walker-rolling   Walk 50 feet activity   Assist Walk 50 feet with 2 turns activity did not occur: Safety/medical concerns         Walk 150 feet activity   Assist Walk 150 feet activity did not occur: Safety/medical concerns         Walk 10 feet  on uneven surface  activity   Assist Walk 10 feet on uneven surfaces activity did not occur: Safety/medical concerns         Wheelchair     Assist Will patient use wheelchair at discharge?: No   Wheelchair activity did not occur: Safety/medical concerns         Wheelchair 50 feet with 2 turns activity    Assist    Wheelchair 50 feet with 2 turns activity did not occur: Safety/medical concerns       Wheelchair 150 feet activity     Assist   Wheelchair 150 feet activity did not occur: Safety/medical concerns       Blood pressure 98/65, pulse 84, temperature 100.1 F (37.8 C), temperature source Oral, resp. rate 16, height 6' 2"  (1.88 m), weight 106.3 kg, SpO2 (!) 88 %.  Medical Problem List and Plan: 1.Functional and mobility deficitssecondary to debility after right nephrectomy and multiple post-op medical complications -patient may shower -ELOS/Goals: 5-7 days, mod I Team conference today please see physician documentation under team conference tab, met with team  to discuss problems,progress, and goals. Formulized individual treatment plan based on medical history, underlying problem and comorbidities.  2. Antithrombotics: -DVT/anticoagulation:Mechanical:Sequential compression devices, below kneeBilateral lower extremities -antiplatelet therapy: N/A 3. Pain Management:Tylenol prn 4. Mood:LCSW to follow for evaluation and support. -antipsychotic agents: N/A 5. Neuropsych: This patientiscapable of making decisions onhisown behalf. 6. Skin/Wound Care:Monitor wound for healing. Protein supplements to promote healing. 7. Fluids/Electrolytes/Nutrition:Change to Carb modified/renal diet--not on fluid restriction at this time. Monitor strict I/O as well as daily weights.  9. Persistent Hypotension: -patient asymptomatic. On midodrine (decreased to 5 mg tid on 08/03) as well as steroids (decreased to 10 mg bid on 08/02)--recommendations to wean as tolerated.  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 HD TTS--will schedule at the end of the day to help with tolerance of therapy.  11. ABLA:Serial labs with HD. On areanesp weekly and s/p iron repletion. Improving 12. New onset A fib with RVR: Monitor HR tid--on low dose metoprolol bid.  Vitals:   06/18/20 0553 06/18/20 0806  BP: 101/63 98/65  Pulse: 96 84  Resp: 16   Temp: 100.1 F (37.8 C)   SpO2: (!) 88%   Still low  following hemodialysis,low in therapy seated position EOB with TEDs on  continue Cortef, will increase back to 96m and midodrine 13. H/o Asthma: Continue pulmicort, brovana and Xopenex nebs  8/9- appeared more uncomfortable- will increase xopenex to q4 hours prn and added O2 prn.   14. T2DM diet controlled: Hgb A1C- 6.2 and controlled. Will monitor BS ac/hs and use SSI for elevated BS--likely due to stress and steroids.  and wean as able.  CBG (last 3)  Recent Labs    06/17/20 1755 06/17/20 2150 06/18/20 0609  GLUCAP 87 102* 96  8/9- BGs 92-109- doing better -con't regimen 15.  Diarrhea no laxative use , only 1 liquid stool thus far, no recent abx, no other meds suspected, If liquid stools cont would check C diff, will check KUB an dkeep on clear liq for now   8/9- per pt, is doing better-  LOS: 8 days A FACE TO FACE EVALUATION WAS PERFORMED  ACharlett Blake8/09/2020, 8:26 AM

## 2020-06-18 NOTE — Progress Notes (Signed)
Physical Therapy Session Note  Patient Details  Name: Todd Robertson MRN: [id number] Date of Birth: 05-26-1946  Today's Date: 06/18/2020 PT Individual Time: 0806-0900 and 7001-7494 PT Individual Time Calculation (min): 54 min and 48 min  Short Term Goals: Week 1:  PT Short Term Goal 1 (Week 1): None due to ELOS  Skilled Therapeutic Interventions/Progress Updates: Pt presented in bed agreeable to therapy. Pt denies pain during session. Pt noted to have slept in TED hose, discussed removing hose at night and sign added to remind nsg as well. BP checked supine 105/57 (70). Pt performed supine to sit with supervision and use of bed features. Pt indicated no s/s hypotension BP checked after 3 min 70/57 (60). MD arrived for daily assessment. Performed ambulatory transfer to recliner CGA and pt performed ankle pumps, LAQ, and hip flexion x 15 bilaterally. BP rechecked 101/69 (80). Participated in gait 39f and 339fhowever required 4-62m66mseated rest breaks between for recovery. Pt noted to have heavy use of accessory ms with PTA attempting to provide education on diaphragmatic breathing and explanation on how use of accessory ms can be more fatiguing.  Pt then indicated episode of lightheadedness with vitals stable (HR 104 SpO2 97%), deferred additional ambulation and transported pt back to room. Pt agreeable to remain in w/c and left with belt alarm on, call bell within reach and needs met.    Tx2:  Pt presented in bed with wife present agreeable to therapy. Pt denies pain at start of session. Performed bed mobility with features and supervision. Performed stand pivot transfer without AD and CGA to w/c. Pt transported to day room and performed stand pivot to NuStep CGA. Participated in NuStep for endurance purposes L1 maintaining 40-45 SPM with pt indicating 5 on mBORG and requiring approx 4-5 min for recovery. Pt performed at 2, 3, and 4 min respectively with extended rest between bouts. Pt noted to be  attempting more controlled/diaphragmatic breathing during rest breaks. Pt returned to w/c via stand pivot in same manner. Pt ambulated 64f53fth RW CGA into room with extensive encouragement. Pt declined further activity and left in recliner with seat alarm on, call bell within reach and wife present. Pt missed 12 min skilled PT due to fatigue.     Therapy Documentation Precautions:  Precautions Precautions: Fall Restrictions Weight Bearing Restrictions: No General: PT Amount of Missed Time (min): 12 Minutes PT Missed Treatment Reason: Patient fatigue Vital Signs: Therapy Vitals Temp: 99 F (37.2 C) Pulse Rate: 81 Resp: 17 BP: (!) 119/55 Patient Position (if appropriate): Lying Oxygen Therapy SpO2: 98 % O2 Device: Room Air    Therapy/Group: Individual Therapy  Todd Robertson  Todd Robertson, PTA  06/18/2020, 2:29 PM

## 2020-06-18 NOTE — Plan of Care (Signed)
  Problem: RH BOWEL ELIMINATION Goal: RH STG MANAGE BOWEL WITH ASSISTANCE Description: STG Manage Bowel with mod I Assistance. Outcome: Progressing Goal: RH STG MANAGE BOWEL W/MEDICATION W/ASSISTANCE Description: STG Manage Bowel with Medication with mod I Assistance. Outcome: Progressing   Problem: RH BLADDER ELIMINATION Goal: RH STG MANAGE BLADDER WITH ASSISTANCE Description: STG Manage Bladder With min Assistance Outcome: Progressing

## 2020-06-18 NOTE — Progress Notes (Addendum)
  [facility] Progress Note   74 y.o.yo malewith advanced CKD at baseline who was admitted on 8/3/2021with need for nephrectomy  Assessment/ Plan:   1. RCC- Need for nephrectomy - done on 7/9- c/b bleeding and progression to ESRD 2. 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 Will be [facility] on TTS schedule upon discharge  - Next HD tomorrow on TTS regimen .  3. Anemia-hgb in the 7's- On Darbe 200 weekly(last given 8/7)and s/p iron repletion 4. Secondary hyperparathyroidism- PTH 116, calc WNL- Last phos high- Have startedphoslo for binding(phos down to 4.3 on 8/7) -> recheck 8/11 (6.2) -> incr to 3 tabs TIDM (appetite likely improving) and then will recheck on sat or sun 5. HTN/volume- req midodrine for BP support Will inc to max dose as well as cortef. Low dose metoprolol for Afib- UF as able with HD 6. Malnutrition-On supps 7. Dispo- CIR but hope eventually to home with OP HD [facility] TTS  Subjective:   Feeling better; denies f/c/n/v or cramping during RRT. No events overnight; sitting in a wheelchair this am    Objective:   BP 98/65 (BP Location: Right Arm)   Pulse 84   Temp 100.1 F (37.8 C) (Oral)   Resp 16   Ht 6\' 2"  (1.88 m) Comment: stated  Wt 106.3 kg   SpO2 (!) 88%   BMI 30.09 kg/m   Intake/Output Summary (Last 24 hours) at 06/18/2020 6948 Last data filed at 06/18/2020 0731 Gross per 24 hour  Intake 622 ml  Output 490 ml  Net 132 ml   Weight change: -0.4 kg  Physical Exam: General: obese, pleasant- Minimal mobility- NAD Heart: RRR Lungs: mostly clear Abdomen: obese, soft, non tender Extremities: min edema Dialysis Access: left FAL AVG + bruit  - right TDC  Imaging: No results found.  Labs: BMET Recent Labs  Lab 06/12/20 1226 06/14/20 1248 06/17/20 1344  NA 135 136 134*  K 4.6 4.0 4.7  CL 98 97* 95*  CO2 22 25  23   GLUCOSE 85 88 124*  BUN 69* 33* 52*  CREATININE 10.73* 7.34* 11.22*  CALCIUM 8.1* 8.3* 8.4*  PHOS 7.2* 4.3 6.2*   CBC Recent Labs  Lab 06/12/20 1226 06/14/20 1248 06/17/20 1344  WBC 8.9 10.0 14.5*  HGB 7.9* 8.5* 8.3*  HCT 26.5* 28.0* 27.8*  MCV 95.7 94.9 97.9  PLT 283 220 197    Medications:    . arformoterol  15 mcg Nebulization BID  . atorvastatin  40 mg Oral Daily  . budesonide (PULMICORT) nebulizer solution  0.5 mg Nebulization BID  . calcium acetate  1,334 mg Oral TID WC  . Chlorhexidine Gluconate Cloth  6 each Topical Q0600  . darbepoetin (ARANESP) injection - DIALYSIS  200 mcg Intravenous Q Sat-HD  . fluticasone  2 spray Each Nare Daily  . hydrocortisone  10 mg Oral BID WC  . insulin aspart  0-5 Units Subcutaneous QHS  . insulin aspart  0-9 Units Subcutaneous TID WC  . insulin detemir  7 Units Subcutaneous QHS  . mouth rinse  15 mL Mouth Rinse q12n4p  . metoprolol tartrate  12.5 mg Oral BID  . midodrine  10 mg Oral TID WC  . multivitamin  1 tablet Oral QHS  . psyllium  1 packet Oral Daily      Otelia Santee, MD 06/18/2020, 9:25 AM

## 2020-06-18 NOTE — Patient Care Conference (Signed)
Inpatient RehabilitationTeam Conference and Plan of Care Update Date: 06/18/2020   Time: 10:20 AM   Patient Name: Todd Robertson      Medical Record Number: [id number]  Date of Birth: August 02, 1946 Sex: Male         Room/Bed: 4M11C/4M11C-01 Payor Info: Payor: MEDICARE / Plan: MEDICARE PART A AND B / Product Type: *No Product type* /    Admit Date/Time:  06/10/2020  7:50 PM  Primary Diagnosis:  Physical debility  Hospital Problems: Principal Problem:   Physical debility Active Problems:   Pressure injury of skin    Expected Discharge Date: Expected Discharge Date: 06/26/20  Team Members Present: Physician leading conference: Dr. Alysia Penna Care Coodinator Present: Dorien Chihuahua, RN, BSN, CRRN;Christina Sampson Goon, [place] Nurse Present: Ellison Carwin, LPN PT Present: Phylliss Bob, PTA OT Present: Laverle Hobby, OT PPS Coordinator present : Ileana Ladd, Burna Mortimer, SLP     Current Status/Progress Goal Weekly Team Focus  Bowel/Bladder   Continent of B&B, last BM 06/16/20. Patient is oliguric.  Remain continent of B&B  Assess toileting needs every 2 hrs and prn    Swallow/Nutrition/ Hydration             ADL's   CGA stand pivot toilet transfers, Mod A bathing bedlevel + w/c level at sink, Setup UB dressing, Max-Toal A LB dressing, Max A toileting  Supervision overall  Activity tolerance, ADL retraining, dynamic balance, pt/family education, energy conservation/work simplification   Mobility             Communication             Safety/Cognition/ Behavioral Observations            Pain   Pateint has not voiced any c/o pain  Remain free from pain  Assess pain every shfit and prn    Skin   Surgical incision to chest scabbing over OTA,   Monitor areas for s/s of infection, prevent skin breakdown   Assess skin every shift and prn     Discharge Planning:  Goal to home with spouse and DTR to provide care   Team Discussion: Orthostasis noted. Progress  impaired by poor endurance and needs extended rest breaks between activities.  Patient on target to meet rehab goals: yes, slow steady progress  *See Care Plan and progress notes for long and short-term goals. Supervision goals set for discharge.  Revisions to Treatment Plan:  PT downgraded goals for ambulation Teaching Needs: TBD; transfers, toileting, ADL assistance, medications, etc.  Current Barriers to Discharge: None identified  Possible Resolutions to Barriers:      Medical Summary Current Status: On HD, loose BMs , orthostatic hypotension  Barriers to Discharge: Other (comments);Medical stability;Hemodialysis  Barriers to Discharge Comments: fatigue Possible Resolutions to [employer]: work on endurance   Continued Need for Acute Rehabilitation Level of Care: The patient requires daily medical management by a physician with specialized training in physical medicine and rehabilitation for the following reasons: Direction of a multidisciplinary physical rehabilitation program to maximize functional independence : Yes Medical management of patient stability for increased activity during participation in an intensive rehabilitation regime.: Yes Analysis of laboratory values and/or radiology reports with any subsequent need for medication adjustment and/or medical intervention. : Yes   I attest that I was present, lead the team conference, and concur with the assessment and plan of the team.   Dorien Chihuahua B 06/18/2020, 2:23 PM

## 2020-06-18 NOTE — Plan of Care (Signed)
°  Problem: RH Ambulation Goal: LTG Patient will ambulate in controlled environment (PT) Description: LTG: Patient will ambulate in a controlled environment, # of feet with assistance (PT). Flowsheets (Taken 06/18/2020 1022) LTG: Ambulation distance in controlled environment: 39ft with LRAD

## 2020-06-18 NOTE — Progress Notes (Signed)
Occupational Therapy Session Note  Patient Details  Name: Todd Robertson MRN: [id number] Date of Birth: [birth year]  Today's Date: 06/18/2020 OT Individual Time: 1000-1100 OT Individual Time Calculation (min): 60 min    Short Term Goals: Week 1:  OT Short Term Goal 1 (Week 1): STG = LTG  Skilled Therapeutic Interventions/Progress Updates:    patient seated on toilet with NT upon arrival.  He was continent of BM.  Dependent for hygiene, able to donn pants, max A for incontinence brief and dependent for pants over hips.  Sit to stand from commode with CGA - he is able to maintain stance for hygiene but needed to sit to rest prior to pants up and SPT to w/c.  He denies pain but reports that he feels SOB. Note that he is using accessory muscles for breathing with exertion.  O2 sat 95-99% during session.  Completed grooming/oral care, UB bathing and dressing w/c level at sink with set up.  Assisted to gym via w/c - completed SPT with RW CS to/from mat table.  Tolerated unsupported sitting for 20 minutes with trunk control, posture and deep breathing activities.  Provided w/c solid back support to assist with upright posture when w/c level - he denies discomfort with this positioning.    He remained seated in w/c at close of session, seat alarm set, call bell in reach.  He states that he is tired but feeling okay.    Therapy Documentation Precautions:  Precautions Precautions: Fall Restrictions Weight Bearing Restrictions: No  Therapy/Group: Individual Therapy  Carlos Levering 06/18/2020, 7:38 AM

## 2020-06-18 NOTE — Progress Notes (Signed)
Occupational Therapy Session Note  Patient Details  Name: Todd Robertson MRN: [id number] Date of Birth: [birth year]  Today's Date: 06/18/2020 OT Individual Time: 1130-1200 OT Individual Time Calculation (min): 30 min    Short Term Goals: Week 1:  OT Short Term Goal 1 (Week 1): STG = LTG  Skilled Therapeutic Interventions/Progress Updates:    Pt received sitting in w/c, flexed forward and c/o fatigue. Pt agreeable to complete session focused on sit <> stands and functional activity tolerance. Vitals monitored throughout session, with all VSS. Pt completed 3x sit <> stands with no more than 10 seconds standing with CGA. Pt immediately requiring seated rest break for 5+ minutes each time- all VSS. Discussed pulse oximeter use at home with pt's wife and provided edu re reading results. Pt completed stand pivot transfer back to bed with close (S). Pt was left supine with all needs met, bed alarm set.   Therapy Documentation Precautions:  Precautions Precautions: Fall Restrictions Weight Bearing Restrictions: No     Therapy/Group: Individual Therapy  Curtis Sites 06/18/2020, 8:30 AM

## 2020-06-19 ENCOUNTER — Inpatient Hospital Stay (HOSPITAL_COMMUNITY): Payer: Medicare Other | Admitting: Occupational Therapy

## 2020-06-19 ENCOUNTER — Inpatient Hospital Stay (HOSPITAL_COMMUNITY): Payer: Medicare Other | Admitting: Physical Therapy

## 2020-06-19 LAB — RENAL FUNCTION PANEL
Albumin: 2.7 g/dL — ABNORMAL LOW (ref 3.5–5.0)
Anion gap: 13 (ref 5–15)
BUN: 37 mg/dL — ABNORMAL HIGH (ref 8–23)
CO2: 26 mmol/L (ref 22–32)
Calcium: 8.9 mg/dL (ref 8.9–10.3)
Chloride: 97 mmol/L — ABNORMAL LOW (ref 98–111)
Creatinine, Ser: 10.01 mg/dL — ABNORMAL HIGH (ref 0.61–1.24)
GFR calc Af Amer: 5 mL/min — ABNORMAL LOW (ref >60)
GFR calc non Af Amer: 5 mL/min — ABNORMAL LOW (ref >60)
Glucose, Bld: 104 mg/dL — ABNORMAL HIGH (ref 70–99)
Phosphorus: 5.1 mg/dL — ABNORMAL HIGH (ref 2.5–4.6)
Potassium: 4.5 mmol/L (ref 3.5–5.1)
Sodium: 136 mmol/L (ref 135–145)

## 2020-06-19 LAB — CBC
HCT: 27.9 % — ABNORMAL LOW (ref 39.0–52.0)
Hemoglobin: 8.3 g/dL — ABNORMAL LOW (ref 13.0–17.0)
MCH: 29.6 pg (ref 26.0–34.0)
MCHC: 29.7 g/dL — ABNORMAL LOW (ref 30.0–36.0)
MCV: 99.6 fL (ref 80.0–100.0)
Platelets: 165 10*3/uL (ref 150–400)
RBC: 2.8 MIL/uL — ABNORMAL LOW (ref 4.22–5.81)
RDW: 19.6 % — ABNORMAL HIGH (ref 11.5–15.5)
WBC: 10.6 10*3/uL — ABNORMAL HIGH (ref 4.0–10.5)
nRBC: 0 % (ref 0.0–0.2)

## 2020-06-19 LAB — GLUCOSE, CAPILLARY
Glucose-Capillary: 79 mg/dL (ref 70–99)
Glucose-Capillary: 101 mg/dL — ABNORMAL HIGH (ref 70–99)
Glucose-Capillary: 89 mg/dL (ref 70–99)
Glucose-Capillary: 122 mg/dL — ABNORMAL HIGH (ref 70–99)

## 2020-06-19 MED ORDER — HEPARIN SODIUM (PORCINE) 1000 UNIT/ML IJ SOLN
INTRAMUSCULAR | Status: AC
  Administered 2020-06-19: 1000 [IU]
  Filled 2020-06-19: qty 4

## 2020-06-19 NOTE — Progress Notes (Signed)
Physical Therapy Session Note  Patient Details  Name: Todd Robertson MRN: [id number] Date of Birth: [birth year]  Today's Date: 06/19/2020 PT Individual Time: 0354-6568 PT Individual Time Calculation (min): 41 min   Short Term Goals: Week 1:  PT Short Term Goal 1 (Week 1): None due to ELOS  Skilled Therapeutic Interventions/Progress Updates: Pt presents sitting in w/c, agreeable to therapy.  BP taken in seated position at 112/66 and wheeled to gym.  Pt performed multiple sit to stand transfers w/ supervision throughout session.  Pt amb multiple trials up to 40' including turns to return to seat.  BP taken after amb and at 117/55 w/o c/o although noted accessory muscle use for breathing.  Pt required 4-5' rest break before attempting again.  O2 sats remained at 100% after gait w/ HR increased to 112 bpm.  Pt returned to room and walked from hallway to bed, approx. 15' w/ RW and CGA.  Pt transferred sit to supine w/ supervision.  Bed alarm on and all needs in reach.     Therapy Documentation Precautions:  Precautions Precautions: Fall Restrictions Weight Bearing Restrictions: No General: PT Amount of Missed Time (min): 37 Minutes PT Missed Treatment Reason: Patient fatigue Vital Signs: Therapy Vitals BP: 127/63 Oxygen Therapy SpO2: 96 % O2 Device: Room Air Pain: pt states a stomach ache.      Therapy/Group: Individual Therapy  Ladoris Gene 06/19/2020, 12:30 PM

## 2020-06-19 NOTE — Progress Notes (Signed)
Occupational Therapy Session Note  Patient Details  Name: Todd Robertson MRN: [id number] Date of Birth: 08-Feb-1946  Today's Date: 06/19/2020 OT Individual Time: 2197-5883 OT Individual Time Calculation (min): 73 min    Short Term Goals: Week 1:  OT Short Term Goal 1 (Week 1): STG = LTG Week 2:     Skilled Therapeutic Interventions/Progress Updates:    patient in bed, alert and ready for am therapy session.   He states that he slept well last night, denies pain and reports that his breathing is "about the same"  .  Supine to sitting edge of bed with CS.  Sit to stand and ambulation with RW to toilet CGA.  He was continent of loose BM (nursing aware)  Requires max A for hygiene.  SPT to w/c CGA.  Completed oral care, grooming, bathing and dressing w/c level.  Mod I grooming tasks seated, set up for UB bathing/dressing, mod A to wash lower body (buttocks and bilateral feet) set up to donn pants, max A incontinence brief, CG A for pants over hips in stance, max A teds, CS and initial instruction for use of sock aide to donn nonskid slipper socks.   Completed UB ergometer - 2 min x 3 with 2 minute rest break between sets.  O2 sat 98% Returned to room, remained seated in w/c at close of session, seat alarm set and call bell in reach.   Therapy Documentation Precautions:  Precautions Precautions: Fall Restrictions Weight Bearing Restrictions: No Therapy/Group: Individual Therapy  Carlos Levering 06/19/2020, 7:34 AM

## 2020-06-19 NOTE — Discharge Planning (Signed)
Notified by Education officer, museum that patient is scheduled for discharge on 8/18. Contacted [facility] to expect patient to start on 8/19. Patient's outpatient schedule is TTS @ 8:30a.m

## 2020-06-19 NOTE — Progress Notes (Signed)
Physical Therapy Session Note  Patient Details  Name: Todd Robertson MRN: [id number] Date of Birth: 1946-10-01  Today's Date: 06/19/2020 PT Individual Time: 3795-5831 PT Individual Time Calculation (min): 38 min   Short Term Goals: Week 1:  PT Short Term Goal 1 (Week 1): None due to ELOS  Skilled Therapeutic Interventions/Progress Updates:   Pt received sitting in WC and agreeable to PT. Pt transported to day room in [facility]. Gait training performed with RW 2 x 64f with supervision assist. Pt noted to use excessive UE support on RW to improve breathing with use of accessory muscles. Pt required multiple prolonged rest breaks following each bout for gait training. Sit<>stand transfers performed with supervision assist from PT throughout session and [facility] then reports increasing SOB and pain in the abdomin.  PT recommends to attempt BM, but pt declines and reports that he wont be able to to perform any additional standing activites due to pain in the abdomin. SpO2 >98% throughout session. And need for breathing treatment. [facility] to [facility] and all needs met.  RN aware of pt condition.        Therapy Documentation Precautions:  Precautions Precautions: Fall Restrictions Weight Bearing Restrictions: No   Pain: 3/10 abdomin, deep. RN made aware   Therapy/Group: Individual Therapy  ALorie Phenix8/10/2020, 10:49 AM

## 2020-06-19 NOTE — Progress Notes (Addendum)
[facility] PHYSICAL MEDICINE & REHABILITATION PROGRESS NOTE   Subjective/Complaints:   Pt sponge bathing, some accessory muscle use but no dyspnea  Pt denies SOB, abd pain, CP, N/V/C/D, Objective:   No results found. Recent Labs    06/17/20 1344  WBC 14.5*  HGB 8.3*  HCT 27.8*  PLT 197   Recent Labs    06/17/20 1344  NA 134*  K 4.7  CL 95*  CO2 23  GLUCOSE 124*  BUN 52*  CREATININE 11.22*  CALCIUM 8.4*    Intake/Output Summary (Last 24 hours) at 06/19/2020 0807 Last data filed at 06/19/2020 0738 Gross per 24 hour  Intake 542 ml  Output --  Net 542 ml     Physical Exam: Vital Signs Blood pressure (!) 97/59, pulse 100, temperature 99.4 F (37.4 C), resp. rate 16, height 6\' 2"  (1.88 m), weight 104.8 kg, SpO2 94 %.  General: No acute distress Mood and affect are appropriate Heart: Regular rate and rhythm no rubs murmurs or extra sounds Lungs: Clear to auscultation, breathing unlabored, no rales or wheezes Abdomen: Positive bowel sounds, soft nontender to palpation, nondistended Extremities: No clubbing, cyanosis, or edema Skin: No evidence of breakdown, no evidence of rash  Neurologicmotor strength is5/5 in bilateral deltoid, bicep, tricep, grip, hip flexor, knee extensors, ankle dorsiflexor and plantar flexor   Musculoskeletal: Full range of motion in all 4 extremities. No joint swelling    Assessment/Plan: 1. Functional deficits secondary to debility renal cell carcinoma which require 3+ hours per day of interdisciplinary therapy in a comprehensive inpatient rehab setting.  Physiatrist is providing close team supervision and 24 hour management of active medical problems listed below.  Physiatrist and rehab team continue to assess barriers to discharge/monitor patient progress toward functional and medical goals  Care Tool:  Bathing    Body parts bathed by patient: Right arm, Left arm, Chest, Abdomen, Front perineal area, Face, Right upper leg, Left  upper leg   Body parts bathed by helper: Buttocks, Right lower leg, Left lower leg Body parts n/a: Front perineal area, Buttocks, Right upper leg, Left upper leg, Right lower leg, Left lower leg   Bathing assist Assist Level: Moderate Assistance - Patient 50 - 74%     Upper Body Dressing/Undressing Upper body dressing   What is the patient wearing?: Pull over shirt    Upper body assist Assist Level: Set up assist    Lower Body Dressing/Undressing Lower body dressing      What is the patient wearing?: Pants, Incontinence brief     Lower body assist Assist for lower body dressing: Minimal Assistance - Patient > 75%     Toileting Toileting    Toileting assist Assist for toileting: Maximal Assistance - Patient 25 - 49%     Transfers Chair/bed transfer  Transfers assist     Chair/bed transfer assist level: Contact Guard/Touching assist Chair/bed transfer assistive device: Programmer, multimedia   Ambulation assist      Assist level: Contact Guard/Touching assist Assistive device: Walker-rolling Max distance: 36   Walk 10 feet activity   Assist     Assist level: Contact Guard/Touching assist Assistive device: Walker-rolling   Walk 50 feet activity   Assist Walk 50 feet with 2 turns activity did not occur: Safety/medical concerns         Walk 150 feet activity   Assist Walk 150 feet activity did not occur: Safety/medical concerns         Walk 10 feet  on uneven surface  activity   Assist Walk 10 feet on uneven surfaces activity did not occur: Safety/medical concerns         Wheelchair     Assist Will patient use wheelchair at discharge?: No   Wheelchair activity did not occur: Safety/medical concerns         Wheelchair 50 feet with 2 turns activity    Assist    Wheelchair 50 feet with 2 turns activity did not occur: Safety/medical concerns       Wheelchair 150 feet activity     Assist  Wheelchair 150  feet activity did not occur: Safety/medical concerns       Blood pressure (!) 97/59, pulse 100, temperature 99.4 F (37.4 C), resp. rate 16, height 6\' 2"  (1.88 m), weight 104.8 kg, SpO2 94 %.  Medical Problem List and Plan: 1.Functional and mobility deficitssecondary to debility after right nephrectomy and multiple post-op medical complications -patient may not shower for 4 more weeks due to new diatek cath  -ELOS/Goals: 5-7 days, mod I   2. Antithrombotics: -DVT/anticoagulation:Mechanical:Sequential compression devices, below kneeBilateral lower extremities -antiplatelet therapy: N/A 3. Pain Management:Tylenol prn 4. Mood:LCSW to follow for evaluation and support. -antipsychotic agents: N/A 5. Neuropsych: This patientiscapable of making decisions onhisown behalf. 6. Skin/Wound Care:Monitor wound for healing. Protein supplements to promote healing. 7. Fluids/Electrolytes/Nutrition:Change to Carb modified/renal diet--not on fluid restriction at this time. Monitor strict I/O as well as daily weights.  9. Persistent Hypotension: -patient asymptomatic. On midodrine (decreased to 5 mg tid on 08/03) as well as steroids (decreased to 10 mg bid on 08/02)--recommendations to wean as tolerated.  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 HD TTS--will schedule at the end of the day to help with tolerance of therapy.  11. ABLA:Serial labs with HD. On areanesp weekly and s/p iron repletion. Improving 12. New onset A fib with RVR: Monitor HR tid--on low dose metoprolol bid.  Vitals:   06/18/20 2050 06/19/20 0546  BP:  (!) 97/59  Pulse: (!) 103 100  Resp: 16 16  Temp:  99.4 F (37.4 C)  SpO2: 96% 94%  Still low following hemodialysis,low in therapy seated position EOB with TEDs on  continue Cortef, will increase back to 10mg  and midodrine, will monitor on increased dose , no dizziness during therapy  13. H/o Asthma: Continue pulmicort,  brovana and Xopenex nebs  8/9- appeared more uncomfortable- will increase xopenex to q4 hours prn and added O2 prn.   14. T2DM diet controlled: Hgb A1C- 6.2 and controlled. Will monitor BS ac/hs and use SSI for elevated BS--likely due to stress and steroids.  and wean as able.  CBG (last 3)  Recent Labs    06/18/20 1646 06/18/20 2129 06/19/20 0625  GLUCAP 99 98 79  may d/c cbg 15.  Loose stools  better-daily and cont LOS: 9 days A FACE TO [place] E Kaydense Rizo 06/19/2020, 8:07 AM

## 2020-06-19 NOTE — Progress Notes (Signed)
  [facility] Progress Note   74 y.o.yo malewith advanced CKD at baseline who was admitted on 8/3/2021with need for nephrectomy  Assessment/ Plan:   1. RCC- Need for nephrectomy - done on 7/9- c/b bleeding and progression to ESRD 2. 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 Will be [facility] on TTS schedule upon discharge  - Next HDtoday and he does need it; mildly dyspneic.  3. Anemia-hgb in the 7's- On Darbe 200 weekly(last given 8/7)and s/p iron repletion 4. Secondary hyperparathyroidism- PTH 116, calc WNL- Last phos high- Have startedphoslo for binding(phos down to 4.3 on 8/7) -> recheck 8/11 (6.2) -> incr to 3 tabs TIDM (appetite likely improving) and then will recheck on sat or sun 5. HTN/volume- req midodrine for BP support Will inc to max dose as well as cortef. Low dose metoprolol for Afib- UF as able with HD 6. Malnutrition-On supps 7. Dispo- CIR but hope eventually to home with OP HD [facility] TTS  Subjective:   Mild dyspnea is present but denies n/v/cp.   Objective:   BP (!) 97/59 (BP Location: Right Arm)   Pulse 100   Temp 99.4 F (37.4 C)   Resp 16   Ht 6\' 2"  (1.88 m) Comment: stated  Wt 104.8 kg   SpO2 96%   BMI 29.66 kg/m   Intake/Output Summary (Last 24 hours) at 06/19/2020 1108 Last data filed at 06/19/2020 9833 Gross per 24 hour  Intake 542 ml  Output --  Net 542 ml   Weight change: -0.2 kg  Physical Exam: General: obese, pleasant- Minimal mobility- NAD Heart: RRR Lungs: mostly clear Abdomen: obese, soft, non tender Extremities: min edema Dialysis Access: left FAL AVG + bruit - right TDC  Imaging: No results found.  Labs: BMET Recent Labs  Lab 06/12/20 1226 06/14/20 1248 06/17/20 1344  NA 135 136 134*  K 4.6 4.0 4.7  CL 98 97* 95*  CO2 22 25 23   GLUCOSE 85 88 124*  BUN 69* 33* 52*  CREATININE 10.73*  7.34* 11.22*  CALCIUM 8.1* 8.3* 8.4*  PHOS 7.2* 4.3 6.2*   CBC Recent Labs  Lab 06/12/20 1226 06/14/20 1248 06/17/20 1344  WBC 8.9 10.0 14.5*  HGB 7.9* 8.5* 8.3*  HCT 26.5* 28.0* 27.8*  MCV 95.7 94.9 97.9  PLT 283 220 197    Medications:    . arformoterol  15 mcg Nebulization BID  . atorvastatin  40 mg Oral Daily  . budesonide (PULMICORT) nebulizer solution  0.5 mg Nebulization BID  . calcium acetate  2,001 mg Oral TID WC  . Chlorhexidine Gluconate Cloth  6 each Topical Q0600  . darbepoetin (ARANESP) injection - DIALYSIS  200 mcg Intravenous Q Sat-HD  . fluticasone  2 spray Each Nare Daily  . hydrocortisone  10 mg Oral BID WC  . insulin aspart  0-5 Units Subcutaneous QHS  . insulin aspart  0-9 Units Subcutaneous TID WC  . insulin detemir  7 Units Subcutaneous QHS  . mouth rinse  15 mL Mouth Rinse q12n4p  . metoprolol tartrate  12.5 mg Oral BID  . midodrine  10 mg Oral TID WC  . multivitamin  1 tablet Oral QHS  . psyllium  1 packet Oral Daily      Otelia Santee, MD 06/19/2020, 11:08 AM

## 2020-06-19 NOTE — Progress Notes (Signed)
Patient ID: Todd Robertson, male   DOB: [birth year], 74 y.o.   MRN: [id number]   Sw has informed pt daughter [name]) of pt new discharge date due to HD schedule. SW also informed dtr of HD location, days and seat time. SW will follow up with pt spouse tomorrow

## 2020-06-20 ENCOUNTER — Inpatient Hospital Stay (HOSPITAL_COMMUNITY): Payer: Medicare Other | Admitting: Occupational Therapy

## 2020-06-20 ENCOUNTER — Inpatient Hospital Stay (HOSPITAL_COMMUNITY): Payer: Medicare Other | Admitting: Physical Therapy

## 2020-06-20 LAB — GLUCOSE, CAPILLARY
Glucose-Capillary: 99 mg/dL (ref 70–99)
Glucose-Capillary: 131 mg/dL — ABNORMAL HIGH (ref 70–99)
Glucose-Capillary: 107 mg/dL — ABNORMAL HIGH (ref 70–99)
Glucose-Capillary: 121 mg/dL — ABNORMAL HIGH (ref 70–99)

## 2020-06-20 NOTE — Progress Notes (Signed)
[facility] PHYSICAL MEDICINE & REHABILITATION PROGRESS NOTE   Subjective/Complaints:  Bowels are doing better per patient report, bladder is emptying, patient uses accessory muscles with breathing but does not complain of shortness of breath Stools are less frequent and patient is continent Pt denies SOB, abd pain, CP, N/V/C/D, Objective:   No results found. Recent Labs    06/17/20 1344 06/19/20 1436  WBC 14.5* 10.6*  HGB 8.3* 8.3*  HCT 27.8* 27.9*  PLT 197 165   Recent Labs    06/17/20 1344 06/19/20 1437  NA 134* 136  K 4.7 4.5  CL 95* 97*  CO2 23 26  GLUCOSE 124* 104*  BUN 52* 37*  CREATININE 11.22* 10.01*  CALCIUM 8.4* 8.9    Intake/Output Summary (Last 24 hours) at 06/20/2020 0911 Last data filed at 06/20/2020 0803 Gross per 24 hour  Intake 540 ml  Output 2000 ml  Net -1460 ml     Physical Exam: Vital Signs Blood pressure 107/64, pulse 100, temperature 98.2 F (36.8 C), resp. rate 18, height 6\' 2"  (1.88 m), weight 102.4 kg, SpO2 97 %.   General: No acute distress Mood and affect are appropriate Heart: Regular rate and rhythm no rubs murmurs or extra sounds Lungs: Clear to auscultation, breathing unlabored, no rales or wheezes Abdomen: Positive bowel sounds, soft nontender to palpation, nondistended Extremities: No clubbing, cyanosis, or edema Skin: No evidence of breakdown, no evidence of rash  Neurologic motor strength is5/5 in bilateral deltoid, bicep, tricep, grip, hip flexor, knee extensors, ankle dorsiflexor and plantar flexor   Musculoskeletal: Full range of motion in all 4 extremities. No joint swelling    Assessment/Plan: 1. Functional deficits secondary to debility renal cell carcinoma which require 3+ hours per day of interdisciplinary therapy in a comprehensive inpatient rehab setting.  Physiatrist is providing close team supervision and 24 hour management of active medical problems listed below.  Physiatrist and rehab team continue to  assess barriers to discharge/monitor patient progress toward functional and medical goals  Care Tool:  Bathing    Body parts bathed by patient: Right arm, Left arm, Chest, Abdomen, Front perineal area, Face, Right upper leg, Left upper leg   Body parts bathed by helper: Buttocks, Right lower leg, Left lower leg Body parts n/a: Front perineal area, Buttocks, Right upper leg, Left upper leg, Right lower leg, Left lower leg   Bathing assist Assist Level: Minimal Assistance - Patient > 75%     Upper Body Dressing/Undressing Upper body dressing   What is the patient wearing?: Pull over shirt    Upper body assist Assist Level: Set up assist    Lower Body Dressing/Undressing Lower body dressing      What is the patient wearing?: Pants, Incontinence brief     Lower body assist Assist for lower body dressing: Minimal Assistance - Patient > 75%     Toileting Toileting    Toileting assist Assist for toileting: Moderate Assistance - Patient 50 - 74%     Transfers Chair/bed transfer  Transfers assist     Chair/bed transfer assist level: Contact Guard/Touching assist Chair/bed transfer assistive device: Programmer, multimedia   Ambulation assist      Assist level: Contact Guard/Touching assist Assistive device: Walker-rolling Max distance: 40   Walk 10 feet activity   Assist     Assist level: Contact Guard/Touching assist Assistive device: Walker-rolling   Walk 50 feet activity   Assist Walk 50 feet with 2 turns activity did not occur:  Safety/medical concerns         Walk 150 feet activity   Assist Walk 150 feet activity did not occur: Safety/medical concerns         Walk 10 feet on uneven surface  activity   Assist Walk 10 feet on uneven surfaces activity did not occur: Safety/medical concerns         Wheelchair     Assist Will patient use wheelchair at discharge?: No   Wheelchair activity did not occur: Safety/medical  concerns         Wheelchair 50 feet with 2 turns activity    Assist    Wheelchair 50 feet with 2 turns activity did not occur: Safety/medical concerns       Wheelchair 150 feet activity     Assist  Wheelchair 150 feet activity did not occur: Safety/medical concerns       Blood pressure 107/64, pulse 100, temperature 98.2 F (36.8 C), resp. rate 18, height 6\' 2"  (1.88 m), weight 102.4 kg, SpO2 97 %.  Medical Problem List and Plan: 1.Functional and mobility deficitssecondary to debility after right nephrectomy and multiple post-op medical complications -patient may not shower until mid Sept due to new diatek cath  -ELOS/Goals: 5-7 days, mod I   2. Antithrombotics: -DVT/anticoagulation:Mechanical:Sequential compression devices, below kneeBilateral lower extremities -antiplatelet therapy: N/A 3. Pain Management:Tylenol prn 4. Mood:LCSW to follow for evaluation and support. -antipsychotic agents: N/A 5. Neuropsych: This patientiscapable of making decisions onhisown behalf. 6. Skin/Wound Care:Monitor wound for healing. Protein supplements to promote healing. 7. Fluids/Electrolytes/Nutrition:Change to Carb modified/renal diet--not on fluid restriction at this time. Monitor strict I/O as well as daily weights.  9. Persistent Hypotension: -patient asymptomatic. 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 HD TTS--will schedule at the end of the day to help with tolerance of therapy.  11. ABLA:Serial labs with HD. On areanesp weekly and s/p iron repletion. Improving 12. New onset A fib with RVR: Monitor HR tid--on low dose metoprolol bid.  Vitals:   06/19/20 2015 06/20/20 0525  BP:  107/64  Pulse:  100  Resp:  18  Temp:  98.2 F (36.8 C)  SpO2: 100% 97%  Still low  following hemodialysis,low in therapy seated position EOB with TEDs on  continue Cortef, will increase back to 10mg  and midodrine, will monitor on increased dose , no dizziness during therapy  13. H/o Asthma: Continue pulmicort, brovana and Xopenex nebs  8/9- appeared more uncomfortable- will increase xopenex to q4 hours prn and added O2 prn.   14. T2DM diet controlled: Hgb A1C- 6.2 and controlled. Will monitor BS ac/hs and use SSI for elevated BS--likely due to stress and steroids.  and wean as able.  CBG (last 3)  Recent Labs    06/19/20 1846 06/19/20 2101 06/20/20 0611  GLUCAP 89 122* 99  may d/c cbg 15.  Loose stools  better-daily and cont LOS: 10 days A FACE TO FACE EVALUATION WAS PERFORMED  Charlett Blake 06/20/2020, 9:11 AM

## 2020-06-20 NOTE — Progress Notes (Signed)
Patient ID: Todd Robertson, male   DOB: 1946-07-14, 74 y.o.   MRN: [id number]   Pt referral sent to [facility], awaiting decision. Will updates spouse.

## 2020-06-20 NOTE — Progress Notes (Signed)
Occupational Therapy Session Note  Patient Details  Name: Todd Robertson MRN: [id number] Date of Birth: 22-May-1946  Today's Date: 06/20/2020 OT Individual Time: 1420-1500 OT Individual Time Calculation (min): 40 min   Short Term Goals: Week 1:  OT Short Term Goal 1 (Week 1): STG = LTG  Skilled Therapeutic Interventions/Progress Updates:    Pt greeted semi-reclined in bed and agreeable to OT treatment session. Pt reported he was fatigued. Pt came to sitting EOB with increased time and supervision. Stand-pivot to wc with CGA. Pt reported feeling dizzy s/p transfer. BP 91/61. Pt brought down to therapy gym and completed 5 mins x 2 on UE Ergometer on level 3. Pt needed extended rest breaks in between sets. Pt then completed LB there-ex with 3 sets of 10 seated toe taps on medium cones, hip adduction squeeze with ball, and seated knee extension. Pt needed extended rest breaks in between LE sets as well 2/2 fatigue. Pt returned to room and left seated in wc with chair alarm on, call bell in reach, and needs met.   Therapy Documentation Precautions:  Precautions Precautions: Fall Restrictions Weight Bearing Restrictions: No Pain:   denies pain at this time.   Therapy/Group: Individual Therapy  Valma Cava 06/20/2020, 3:01 PM

## 2020-06-20 NOTE — Progress Notes (Signed)
Physical Therapy Session Note  Patient Details  Name: Todd Robertson MRN: [id number] Date of Birth: [birth year]  Today's Date: 06/20/2020 PT Individual Time: 1015-1108 PT Individual Time Calculation (min): 53 min   Short Term Goals: Week 1:  PT Short Term Goal 1 (Week 1): None due to ELOS  Skilled Therapeutic Interventions/Progress Updates:   Pt received sitting in WC and agreeable to PT. Pt transported to day room. nustep endurance training x 5 min BLE.  Reciprocal foot tap on 4 inch step. Lateral foot tap on 4 inch step. Supervision assist from PT with cues for posture and pursed lip breathing. Gait training with RW x 22f; supervision assist from PT and cues for safety in turn to transition to sitting. Sit<>stand transfer training throughout session with supervision assist and increased to initiate movement due to fatigue and SOB. Pt required multiple prolonged rest breaks throughout session but reports decreased orthostasis s/s with movement. Pt returned to room and performed stand pivot transfer to bed with RW and supervision assist. Sit>supine completed without assist, and left supine in bed with call bell in reach and all needs met.        Therapy Documentation Precautions:  Precautions Precautions: Fall Restrictions Weight Bearing Restrictions: No  Vital Signs: Oxygen Therapy SpO2: 97 % O2 Device: Room Air Pain: Denies       Therapy/Group: Individual Therapy  ALorie Phenix8/13/2021, 1:05 PM

## 2020-06-20 NOTE — Progress Notes (Signed)
Occupational Therapy Weekly Progress Note  Patient Details  Name: Todd Robertson MRN: [id number] Date of Birth: Jul 22, 1946  Beginning of progress report period: 06/11/2020 End of progress report period: 06/20/2020   Patient has not yet met his LTGs at this time.   Due to pts limited activity tolerance and orthostatic symptoms, his LOS has been extended. Pt can at present completed stand pivot and short distance ambulatory transfers with CGA. He completes self care tasks at [facility] A level overall sit<stand at sink. Family education has been scheduled for Monday 8/16. Continue POC.   Patient continues to demonstrate the following deficits: muscle weakness, decreased cardiorespiratoy endurance and decreased standing balance and therefore will continue to benefit from skilled OT intervention to enhance overall performance with BADL and iADL.  Patient progressing toward long term goals..  Continue plan of care.  OT Short Term Goals Week 1:  OT Short Term Goal 1 (Week 1): STG = LTG OT Short Term Goal 1 - Progress (Week 1): Progressing toward goal Week 2:  OT Short Term Goal 1 (Week 2): STGs=LTGs due to ELOS   Therapy Documentation Precautions:  Precautions Precautions: Fall Restrictions Weight Bearing Restrictions: No Vital Signs: Therapy Vitals Temp: 98.3 F (36.8 C) Pulse Rate: 74 Resp: 16 BP: 118/62 Patient Position (if appropriate): Sitting Oxygen Therapy SpO2: 99 % O2 Device: Room Air ADL: ADL Eating: Set up Grooming: Setup Where Assessed-Grooming: Sitting at sink Upper Body Bathing: Minimal assistance Where Assessed-Upper Body Bathing: Sitting at sink Lower Body Bathing: Moderate assistance Where Assessed-Lower Body Bathing: Sitting at sink Upper Body Dressing: Minimal assistance Where Assessed-Upper Body Dressing: Sitting at sink Lower Body Dressing: Moderate assistance Where Assessed-Lower Body Dressing: Sitting at sink, Standing at sink Toilet Transfer: Minimal  assistance Toilet Transfer Method: Ambulating     Therapy/Group: Individual Therapy  Izaias Krupka A Akshith Moncus 06/20/2020, 4:34 PM

## 2020-06-20 NOTE — Progress Notes (Signed)
Occupational Therapy Session Note  Patient Details  Name: Todd Robertson MRN: [id number] Date of Birth: 10/09/1946  Today's Date: 06/20/2020 OT Individual Time: 0756-0900 OT Individual Time Calculation (min): 64 min    Short Term Goals: Week 1:  OT Short Term Goal 1 (Week 1): STG = LTG  Skilled Therapeutic Interventions/Progress Updates:    patient alert and ready for am therapy session.  He denies pain, states that he slept well, reports no change to breathing.  Supine to sitting edge of bed with CS, sit to stand and short distance ambulation to w/c with RW CS.  Able to doff all clothing with S using dressing stick for LB.  Grooming and UB bathing/dressing completed seated at sink with set up.  Provided long handled sponge for ease of washing feet/lower legs.  Patient able to wash lower body with crossing legs to reach feet today, CG A in stance to wash buttocks.   Min a to fasten incontinence brief, pants set up, CGA for clothing management.   Max A to donn thigh high teds, set up for slipper socks using sock aide.  Rest breaks needed at times during session.  Completed incentive spirometer.  Called and spoke with wife to set up family education session for Monday am - scheduler and CM informed of plan.  Patient remained in w/c at close of session, call bell in hand.    Therapy Documentation Precautions:  Precautions Precautions: Fall Restrictions Weight Bearing Restrictions: No  Therapy/Group: Individual Therapy  Carlos Levering 06/20/2020, 7:28 AM

## 2020-06-20 NOTE — Progress Notes (Signed)
  [facility] Progress Note   74 y.o.yo malewith advanced CKD at baseline who was admitted on 8/3/2021with need for nephrectomy  Assessment/ Plan:   1. RCC- Need for nephrectomy - done on 7/9- c/b bleeding and progression to ESRD 2. 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 Will be [facility] on TTS schedule upon discharge  - Next HD tomorrow; will try for 2.5 liter net given the  Dyspnea. Weight curently 102.4kg. Trying to get down to 101-101.5kg  3. Anemia-hgb in the 7's- On Darbe 200 weekly(last given 8/7)and s/p iron repletion 4. Secondary hyperparathyroidism- PTH 116, calc WNL- Last phos high- Have startedphoslo for binding(phos down to 4.3 on 8/7) -> recheck8/11 (6.2) -> incr to3tabs TIDM (appetite likely improving) and then will recheck on sat or sun 5. HTN/volume- req midodrine for BP support Will inc to max dose as well as cortef. Low dose metoprolol for Afib- UF as able with HD 6. Malnutrition-On supps 7. Dispo- CIR but hope eventually to home with OP HD [facility] TTS  Subjective:   Dyspnea is present but denies n/v/cp.   Objective:   BP 118/62 (BP Location: Right Arm)   Pulse 74   Temp 98.3 F (36.8 C)   Resp 16   Ht 6\' 2"  (1.88 m) Comment: stated  Wt 102.4 kg   SpO2 99%   BMI 28.98 kg/m   Intake/Output Summary (Last 24 hours) at 06/20/2020 1540 Last data filed at 06/20/2020 1331 Gross per 24 hour  Intake 762 ml  Output 2000 ml  Net -1238 ml   Weight change: 0.1 kg  Physical Exam: General: obese, pleasant- Minimal mobility- NAD Heart: RRR Lungs: mostly clear Abdomen: obese, soft, non tender Extremities: min edema Dialysis Access: leftFALAVG+ bruit- right TDC  Imaging: No results found.  Labs: BMET Recent Labs  Lab 06/14/20 1248 06/17/20 1344 06/19/20 1437  NA 136 134* 136  K 4.0 4.7 4.5  CL 97* 95* 97*   CO2 25 23 26   GLUCOSE 88 124* 104*  BUN 33* 52* 37*  CREATININE 7.34* 11.22* 10.01*  CALCIUM 8.3* 8.4* 8.9  PHOS 4.3 6.2* 5.1*   CBC Recent Labs  Lab 06/14/20 1248 06/17/20 1344 06/19/20 1436  WBC 10.0 14.5* 10.6*  HGB 8.5* 8.3* 8.3*  HCT 28.0* 27.8* 27.9*  MCV 94.9 97.9 99.6  PLT 220 197 165    Medications:    . arformoterol  15 mcg Nebulization BID  . atorvastatin  40 mg Oral Daily  . budesonide (PULMICORT) nebulizer solution  0.5 mg Nebulization BID  . calcium acetate  2,001 mg Oral TID WC  . Chlorhexidine Gluconate Cloth  6 each Topical Q0600  . darbepoetin (ARANESP) injection - DIALYSIS  200 mcg Intravenous Q Sat-HD  . fluticasone  2 spray Each Nare Daily  . hydrocortisone  10 mg Oral BID WC  . insulin aspart  0-5 Units Subcutaneous QHS  . insulin aspart  0-9 Units Subcutaneous TID WC  . insulin detemir  7 Units Subcutaneous QHS  . mouth rinse  15 mL Mouth Rinse q12n4p  . metoprolol tartrate  12.5 mg Oral BID  . midodrine  10 mg Oral TID WC  . multivitamin  1 tablet Oral QHS  . psyllium  1 packet Oral Daily      Otelia Santee, MD 06/20/2020, 3:40 PM

## 2020-06-20 NOTE — Progress Notes (Signed)
Patient ID: Todd Robertson, male   DOB: [birth year], 74 y.o.   MRN: [id number]   Pt TTB ordered through [facility]

## 2020-06-20 NOTE — Progress Notes (Signed)
Physical Therapy Session Note  Patient Details  Name: Todd Robertson MRN: [id number] Date of Birth: [birth year]  Today's Date: 06/20/2020 PT Individual Time: 1645-1710 PT Individual Time Calculation (min): 25 min   Short Term Goals: Week 1:  PT Short Term Goal 1 (Week 1): None due to ELOS  Skilled Therapeutic Interventions/Progress Updates:   Pt received sitting in WC and agreeable to PT. Pt transported to rehab gym in [facility].   PT reports that he is too fatigued to attempt gait training this afternoon, but agreeable to to seated LE therex with level 2 tband x 2 bouts: LAQ x10, HS curl x 10, hip flexion x 8, ankle PF x 15, hip abduction x 12, hip extension x 15. Cues for decreased speed with eccentric movements. Pt continues to decline gait training due to fatigue and drowsiness due to Benadryl. Patient returned to room and performed stand pivot to recliner with supervision assist without AD. Pt left sitting in recliner with call bell in reach and all needs met.           Therapy Documentation Precautions:  Precautions Precautions: Fall Restrictions Weight Bearing Restrictions: No General: PT Amount of Missed Time (min): 20 Minutes PT Missed Treatment Reason: Patient fatigue Vital Signs: Therapy Vitals Temp: 98.3 F (36.8 C) Pulse Rate: 74 Resp: 16 BP: 118/62 Patient Position (if appropriate): Sitting Oxygen Therapy SpO2: 99 % O2 Device: Room Air Pain: denies   Therapy/Group: Individual Therapy  [name] 06/20/2020, 5:23 PM

## 2020-06-21 LAB — RENAL FUNCTION PANEL
Albumin: 2.7 g/dL — ABNORMAL LOW (ref 3.5–5.0)
Anion gap: 14 (ref 5–15)
BUN: 30 mg/dL — ABNORMAL HIGH (ref 8–23)
CO2: 25 mmol/L (ref 22–32)
Calcium: 8.8 mg/dL — ABNORMAL LOW (ref 8.9–10.3)
Chloride: 98 mmol/L (ref 98–111)
Creatinine, Ser: 9.19 mg/dL — ABNORMAL HIGH (ref 0.61–1.24)
GFR calc Af Amer: 6 mL/min — ABNORMAL LOW (ref >60)
GFR calc non Af Amer: 5 mL/min — ABNORMAL LOW (ref >60)
Glucose, Bld: 107 mg/dL — ABNORMAL HIGH (ref 70–99)
Phosphorus: 4.5 mg/dL (ref 2.5–4.6)
Potassium: 4 mmol/L (ref 3.5–5.1)
Sodium: 137 mmol/L (ref 135–145)

## 2020-06-21 LAB — CBC
HCT: 28.7 % — ABNORMAL LOW (ref 39.0–52.0)
Hemoglobin: 8.9 g/dL — ABNORMAL LOW (ref 13.0–17.0)
MCH: 31 pg (ref 26.0–34.0)
MCHC: 31 g/dL (ref 30.0–36.0)
MCV: 100 fL (ref 80.0–100.0)
Platelets: 163 10*3/uL (ref 150–400)
RBC: 2.87 MIL/uL — ABNORMAL LOW (ref 4.22–5.81)
RDW: 20.3 % — ABNORMAL HIGH (ref 11.5–15.5)
WBC: 9 10*3/uL (ref 4.0–10.5)
nRBC: 0 % (ref 0.0–0.2)

## 2020-06-21 LAB — GLUCOSE, CAPILLARY
Glucose-Capillary: 101 mg/dL — ABNORMAL HIGH (ref 70–99)
Glucose-Capillary: 106 mg/dL — ABNORMAL HIGH (ref 70–99)
Glucose-Capillary: 103 mg/dL — ABNORMAL HIGH (ref 70–99)
Glucose-Capillary: 105 mg/dL — ABNORMAL HIGH (ref 70–99)

## 2020-06-21 MED ORDER — SODIUM CHLORIDE 0.9 % IV SOLN
100.0000 mL | INTRAVENOUS | Status: DC | PRN
Start: 2020-06-21 — End: 2020-06-21

## 2020-06-21 MED ORDER — HEPARIN SODIUM (PORCINE) 1000 UNIT/ML DIALYSIS: 1000.0000 [IU] | INTRAMUSCULAR | Status: DC | PRN

## 2020-06-21 MED ORDER — SODIUM CHLORIDE 0.9 % IV SOLN: 100.0000 mL | INTRAVENOUS | Status: DC | PRN

## 2020-06-21 MED ORDER — PENTAFLUOROPROP-TETRAFLUOROETH EX AERO: 1.0000 "application " | INHALATION_SPRAY | CUTANEOUS | Status: DC | PRN

## 2020-06-21 MED ORDER — PENTAFLUOROPROP-TETRAFLUOROETH EX AERO
1.0000 | INHALATION_SPRAY | CUTANEOUS | Status: DC | PRN
Start: 2020-06-21 — End: 2020-06-21

## 2020-06-21 MED ORDER — LIDOCAINE HCL (PF) 1 % IJ SOLN
5.0000 mL | INTRAMUSCULAR | Status: DC | PRN
Start: 2020-06-21 — End: 2020-06-21

## 2020-06-21 MED ORDER — ALTEPLASE 2 MG IJ SOLR: 2.0000 mg | Freq: Once | INTRAMUSCULAR | Status: DC | PRN

## 2020-06-21 MED ORDER — LIDOCAINE-PRILOCAINE 2.5-2.5 % EX CREA: 1.0000 "application " | TOPICAL_CREAM | CUTANEOUS | Status: DC | PRN

## 2020-06-21 MED ORDER — DARBEPOETIN ALFA 200 MCG/0.4ML IJ SOSY
PREFILLED_SYRINGE | INTRAMUSCULAR | Status: AC
  Filled 2020-06-21: qty 0.4

## 2020-06-21 MED ORDER — HEPARIN SODIUM (PORCINE) 1000 UNIT/ML IJ SOLN
INTRAMUSCULAR | Status: AC
  Administered 2020-06-21: 1000 [IU]
  Filled 2020-06-21: qty 4

## 2020-06-21 MED ORDER — HEPARIN SODIUM (PORCINE) 1000 UNIT/ML DIALYSIS
1000.0000 [IU] | INTRAMUSCULAR | Status: DC | PRN
Start: 2020-06-21 — End: 2020-06-21

## 2020-06-21 MED ORDER — LIDOCAINE HCL (PF) 1 % IJ SOLN: 5.0000 mL | INTRAMUSCULAR | Status: DC | PRN

## 2020-06-21 MED ORDER — LIDOCAINE-PRILOCAINE 2.5-2.5 % EX CREA
1.0000 | TOPICAL_CREAM | CUTANEOUS | Status: DC | PRN
Start: 2020-06-21 — End: 2020-06-21

## 2020-06-21 NOTE — Progress Notes (Signed)
  [facility] Progress Note   74 y.o.yo malewith advanced CKD at baseline who was admitted on 8/3/2021with need for nephrectomy  Assessment/ Plan:   1. RCC- Need for nephrectomy - done on 7/9- c/b bleeding and progression to ESRD 2. 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 Will be [facility] on TTS schedule upon discharge  - Next HD today;  will try for 2.5 liter net given the  Dyspnea. Weight currently 102.4kg. Trying to get down to 101-101.5kg  3. Anemia-hgb in the 7's- On Darbe 200 weekly(last given 8/7)and s/p iron repletion 4. Secondary hyperparathyroidism- PTH 116, calc WNL- Last phos high- Have startedphoslo for binding(phos down to 4.3 on 8/7) -> recheck8/11 (6.2) -> incr to3tabs TIDM (appetite likely improving) and then will recheck on sat or sun 5. HTN/volume- req midodrine for BP support Will inc to max dose as well as cortef. Low dose metoprolol for Afib- UF as able with HD 6. Malnutrition-On supps 7. Dispo- CIR but hope eventually to home with OP HD [facility] TTS  Subjective:   Dyspnea is present but denies n/v/cp. No events overnight   Objective:   BP (!) 103/47 (BP Location: Right Arm)   Pulse 100   Temp 98.5 F (36.9 C) (Oral)   Resp 17   Ht 6\' 2"  (1.88 m) Comment: stated  Wt 102.7 kg   SpO2 97%   BMI 29.07 kg/m   Intake/Output Summary (Last 24 hours) at 06/21/2020 1054 Last data filed at 06/21/2020 0900 Gross per 24 hour  Intake 804 ml  Output --  Net 804 ml   Weight change: -2.2 kg  Physical Exam: General: obese, pleasant- Minimal mobility- NAD Heart: RRR Lungs: mostly clear Abdomen: obese, soft, non tender Extremities: min edema Dialysis Access: leftFALAVG+ bruit- right TDC  Imaging: No results found.  Labs: BMET Recent Labs  Lab 06/14/20 1248 06/17/20 1344 06/19/20 1437  NA 136 134* 136  K 4.0  4.7 4.5  CL 97* 95* 97*  CO2 25 23 26   GLUCOSE 88 124* 104*  BUN 33* 52* 37*  CREATININE 7.34* 11.22* 10.01*  CALCIUM 8.3* 8.4* 8.9  PHOS 4.3 6.2* 5.1*   CBC Recent Labs  Lab 06/14/20 1248 06/17/20 1344 06/19/20 1436  WBC 10.0 14.5* 10.6*  HGB 8.5* 8.3* 8.3*  HCT 28.0* 27.8* 27.9*  MCV 94.9 97.9 99.6  PLT 220 197 165    Medications:    . arformoterol  15 mcg Nebulization BID  . atorvastatin  40 mg Oral Daily  . budesonide (PULMICORT) nebulizer solution  0.5 mg Nebulization BID  . calcium acetate  2,001 mg Oral TID WC  . Chlorhexidine Gluconate Cloth  6 each Topical Q0600  . darbepoetin (ARANESP) injection - DIALYSIS  200 mcg Intravenous Q Sat-HD  . fluticasone  2 spray Each Nare Daily  . hydrocortisone  10 mg Oral BID WC  . insulin aspart  0-5 Units Subcutaneous QHS  . insulin aspart  0-9 Units Subcutaneous TID WC  . insulin detemir  7 Units Subcutaneous QHS  . mouth rinse  15 mL Mouth Rinse q12n4p  . metoprolol tartrate  12.5 mg Oral BID  . midodrine  10 mg Oral TID WC  . multivitamin  1 tablet Oral QHS  . psyllium  1 packet Oral Daily      Otelia Santee, MD 06/21/2020, 10:54 AM

## 2020-06-21 NOTE — Progress Notes (Signed)
[facility] PHYSICAL MEDICINE & REHABILITATION PROGRESS NOTE   Subjective/Complaints: No complaints this morning. Had HD today  Pt denies SOB, abd pain, CP, N/V/C/D  Objective:   No results found. Recent Labs    06/19/20 1436 06/21/20 1313  WBC 10.6* 9.0  HGB 8.3* 8.9*  HCT 27.9* 28.7*  PLT 165 163   Recent Labs    06/19/20 1437 06/21/20 1313  NA 136 137  K 4.5 4.0  CL 97* 98  CO2 26 25  GLUCOSE 104* 107*  BUN 37* 30*  CREATININE 10.01* 9.19*  CALCIUM 8.9 8.8*    Intake/Output Summary (Last 24 hours) at 06/21/2020 1855 Last data filed at 06/21/2020 1705 Gross per 24 hour  Intake 480 ml  Output 2750 ml  Net -2270 ml     Physical Exam: Vital Signs Blood pressure (!) 129/48, pulse 75, temperature 98.7 F (37.1 C), temperature source Oral, resp. rate 16, height 6\' 2"  (1.88 m), weight 99.5 kg, SpO2 98 %.    General: Alert and oriented, No apparent distress HEENT: Head is normocephalic, atraumatic, PERRLA, EOMI, sclera anicteric, oral mucosa pink and moist, dentition intact, ext ear canals clear,  Neck: Supple without JVD or lymphadenopathy Heart: Reg rate and rhythm. No murmurs rubs or gallops Chest: CTA bilaterally without wheezes, rales, or rhonchi; no distress Abdomen: Soft, non-tender, non-distended, bowel sounds positive. Extremities: No clubbing, cyanosis, or edema. Pulses are 2+ Skin: Clean and intact without signs of breakdown  Neurologic motor strength is5/5 in bilateral deltoid, bicep, tricep, grip, hip flexor, knee extensors, ankle dorsiflexor and plantar flexor   Musculoskeletal: Full range of motion in all 4 extremities. No joint swelling    Assessment/Plan: 1. Functional deficits secondary to debility renal cell carcinoma which require 3+ hours per day of interdisciplinary therapy in a comprehensive inpatient rehab setting.  Physiatrist is providing close team supervision and 24 hour management of active medical problems listed  below.  Physiatrist and rehab team continue to assess barriers to discharge/monitor patient progress toward functional and medical goals  Care Tool:  Bathing    Body parts bathed by patient: Right arm, Left arm, Chest, Abdomen, Front perineal area, Face, Right upper leg, Left upper leg, Buttocks, Right lower leg, Left lower leg   Body parts bathed by helper: Buttocks, Right lower leg, Left lower leg Body parts n/a: Front perineal area, Buttocks, Right upper leg, Left upper leg, Right lower leg, Left lower leg   Bathing assist Assist Level: Contact Guard/Touching assist     Upper Body Dressing/Undressing Upper body dressing   What is the patient wearing?: Pull over shirt    Upper body assist Assist Level: Set up assist    Lower Body Dressing/Undressing Lower body dressing      What is the patient wearing?: Pants, Incontinence brief     Lower body assist Assist for lower body dressing: Maximal Assistance - Patient 25 - 49%     Toileting Toileting    Toileting assist Assist for toileting: Moderate Assistance - Patient 50 - 74%     Transfers Chair/bed transfer  Transfers assist     Chair/bed transfer assist level: Contact Guard/Touching assist Chair/bed transfer assistive device: Programmer, multimedia   Ambulation assist      Assist level: Contact Guard/Touching assist Assistive device: Walker-rolling Max distance: 40   Walk 10 feet activity   Assist     Assist level: Contact Guard/Touching assist Assistive device: Walker-rolling   Walk 50 feet activity   Assist  Walk 50 feet with 2 turns activity did not occur: Safety/medical concerns         Walk 150 feet activity   Assist Walk 150 feet activity did not occur: Safety/medical concerns         Walk 10 feet on uneven surface  activity   Assist Walk 10 feet on uneven surfaces activity did not occur: Safety/medical concerns         Wheelchair     Assist Will patient  use wheelchair at discharge?: No   Wheelchair activity did not occur: Safety/medical concerns         Wheelchair 50 feet with 2 turns activity    Assist    Wheelchair 50 feet with 2 turns activity did not occur: Safety/medical concerns       Wheelchair 150 feet activity     Assist  Wheelchair 150 feet activity did not occur: Safety/medical concerns       Blood pressure (!) 129/48, pulse 75, temperature 98.7 F (37.1 C), temperature source Oral, resp. rate 16, height 6\' 2"  (1.88 m), weight 99.5 kg, SpO2 98 %.  Medical Problem List and Plan: 1.Functional and mobility deficitssecondary to debility after right nephrectomy and multiple post-op medical complications -patient may not shower until mid Sept due to new diatek cath  -ELOS/Goals: 5-7 days, mod I  Continue CIR 2. Antithrombotics: -DVT/anticoagulation:Mechanical:Sequential compression devices, below kneeBilateral lower extremities -antiplatelet therapy: N/A 3. Pain Management:Tylenol prn. Well controlled 4. Mood:LCSW to follow for evaluation and support. -antipsychotic agents: N/A 5. Neuropsych: This patientiscapable of making decisions onhisown behalf. 6. Skin/Wound Care:Monitor wound for healing. Protein supplements to promote healing. 7. Fluids/Electrolytes/Nutrition:Change to Carb modified/renal diet--not on fluid restriction at this time. Monitor strict I/O as well as daily weights.  9. Persistent Hypotension: -patient asymptomatic. 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 HD TTS--will schedule at the end of the day to help with tolerance of therapy.  11. ABLA:Serial labs with HD. On areanesp weekly and s/p iron repletion. Hgb improved to 8.9 on 8/14.  12. New onset A fib with RVR: Monitor HR tid--on low dose  metoprolol bid.  Vitals:   06/21/20 1645 06/21/20 1705  BP: (!) 116/51 (!) 129/48  Pulse: 64 75  Resp:  16  Temp:  98.7 F (37.1 C)  SpO2:  98%  BP better controlled.  13. H/o Asthma: Continue pulmicort, brovana and Xopenex nebs  8/9- appeared more uncomfortable- will increase xopenex to q4 hours prn and added O2 prn.   14. T2DM diet controlled: Hgb A1C- 6.2 and controlled. Will monitor BS ac/hs and use SSI for elevated BS--likely due to stress and steroids.  and wean as able.  CBG (last 3)  Recent Labs    06/21/20 0625 06/21/20 1153 06/21/20 1810  GLUCAP 101* 106* 103*  may d/c cbg 15.  Loose stools  better-daily and cont LOS: 11 days A FACE TO FACE EVALUATION WAS PERFORMED  Clide Deutscher Conlan Miceli 06/21/2020, 6:55 PM

## 2020-06-22 ENCOUNTER — Inpatient Hospital Stay (HOSPITAL_COMMUNITY): Payer: Medicare Other | Admitting: Occupational Therapy

## 2020-06-22 LAB — PHOSPHORUS: Phosphorus: 3.4 mg/dL (ref 2.5–4.6)

## 2020-06-22 LAB — GLUCOSE, CAPILLARY
Glucose-Capillary: 94 mg/dL (ref 70–99)
Glucose-Capillary: 124 mg/dL — ABNORMAL HIGH (ref 70–99)
Glucose-Capillary: 145 mg/dL — ABNORMAL HIGH (ref 70–99)
Glucose-Capillary: 115 mg/dL — ABNORMAL HIGH (ref 70–99)

## 2020-06-22 NOTE — Progress Notes (Signed)
Occupational Therapy Session Note  Patient Details  Name: Todd Robertson MRN: [id number] Date of Birth: [birth year]  Today's Date: 06/22/2020 OT Individual Time: 1093-2355 and 7322-0254 OT Individual Time Calculation (min): 43 min and 42 min  Short Term Goals: Week 2:  OT Short Term Goal 1 (Week 2): STGs=LTGs due to ELOS  Skilled Therapeutic Interventions/Progress Updates:    Pt greeted in bed with no c/o pain, just finishing his breathing treatment. He wanted to get washed up today, requesting to complete LB self care bedlevel. OT set up his bed flat without bedrails, per bed setup at home. He was able to doff LB clothing including brief with cue to initiate, LB self care was then completed with min cues for sequencing. Pt utilized small rolls, bridges, and reclined figure 4 method to meet task demands given increased time. OT donned Teds. Supine<sit from flat bed without bedrail completed with setup assistance. Supervision for stand pivot<w/c without AD. He then resumed with UB bathing/dressing, oral care, and grooming tasks while sitting at the sink with setup assistance. Pt initiated multiple rest breaks due to fatigue, denied symptoms of orthostatics. He requested to return to bed at the end of session, supervision for stand pivot<bed, which was still flat without bedrail. Pt transitioned to supine unassisted where OT then raised the HOB. Educated him to use pillows to increase HOB height at home if this was more comfortable for breathing/sleeping. Left him with all needs within reach and bed alarm set. Tx focus placed on ADL retraining, adaptive self care skills to promote energy conservation, and activity tolerance.    2nd Session 1:1 tx (43 min) Pt greeted in the recliner, just finished eating lunch. Spouse [name] present. Started tx by discussing toileting setup at home. Pt expressed interest in having the w/c at home, already has a RW. We talked about placing the w/c at the doorway of the  bathroom and then pt standing with the RW and pivotting over to toilet. Pt agreeable to practice this in a simulated fashion. Stand pivot<w/c completed without device and supervision assistance. Once pt sat in the w/c he began to vomit, reporting dizziness. Vitals assessed with RN present, WNL. OT provided him with an antinausea aromatherapy blend and lavender aromatherapy to address his HA. Discussed with [name] pts bathing options for home, whether bedlevel for LB to conserve energy and w/c level at sink for UB, or sinkside bathing/dressing at sit<stand level. Also discussed that pt has an MD order that he can shower, discussed methods for covering HD port to ensure dryness. Recommended bedlevel and/or w/c level bathing at home until pts activity tolerance increases. Both pt and spouse agreeable. Pt transferred back to the recliner with CGA. Left pt in the recliner with all needs within reach and chair alarm set.    Therapy Documentation Precautions:  Precautions Precautions: Fall Restrictions Weight Bearing Restrictions: No Vital Signs: Therapy Vitals Temp: 98.7 F (37.1 C) Temp Source: Oral Pulse Rate: 88 Resp: 18 BP: 104/62 Patient Position (if appropriate): Sitting Oxygen Therapy SpO2: 100 % O2 Device: Room Air Pain: no c/o pain during 2nd session, just dizziness and "not feeling right." RN made aware during session   ADL: ADL Eating: Set up Grooming: Setup Where Assessed-Grooming: Sitting at sink Upper Body Bathing: Minimal assistance Where Assessed-Upper Body Bathing: Sitting at sink Lower Body Bathing: Moderate assistance Where Assessed-Lower Body Bathing: Sitting at sink Upper Body Dressing: Minimal assistance Where Assessed-Upper Body Dressing: Sitting at sink Lower Body Dressing: Moderate assistance  Where Assessed-Lower Body Dressing: Sitting at sink, Standing at sink Toilet Transfer: Minimal assistance Toilet Transfer Method: Ambulating     Therapy/Group:  Individual Therapy  Mykela Mewborn A Eliz Nigg 06/22/2020, 4:15 PM

## 2020-06-22 NOTE — Progress Notes (Signed)
Patient c/o nausea. Prn meds given. Refused dinner at this time.

## 2020-06-22 NOTE — Progress Notes (Signed)
[facility] PHYSICAL MEDICINE & REHABILITATION PROGRESS NOTE   Subjective/Complaints: No complaints this morning Resting, easily arousable HD labs from yesterday reviewed and are stable  ROS: Pt denies SOB, abd pain, CP, N/V/C/D  Objective:   No results found. Recent Labs    06/19/20 1436 06/21/20 1313  WBC 10.6* 9.0  HGB 8.3* 8.9*  HCT 27.9* 28.7*  PLT 165 163   Recent Labs    06/19/20 1437 06/21/20 1313  NA 136 137  K 4.5 4.0  CL 97* 98  CO2 26 25  GLUCOSE 104* 107*  BUN 37* 30*  CREATININE 10.01* 9.19*  CALCIUM 8.9 8.8*    Intake/Output Summary (Last 24 hours) at 06/22/2020 1112 Last data filed at 06/22/2020 0215 Gross per 24 hour  Intake 390 ml  Output 2750 ml  Net -2360 ml     Physical Exam: Vital Signs Blood pressure (!) 104/59, pulse (!) 104, temperature 98.5 F (36.9 C), temperature source Oral, resp. rate 20, height 6\' 2"  (1.88 m), weight 101 kg, SpO2 99 %. General: Alert and oriented x 3, No apparent distress HEENT: Head is normocephalic, atraumatic, PERRLA, EOMI, sclera anicteric, oral mucosa pink and moist, dentition intact, ext ear canals clear,  Neck: Supple without JVD or lymphadenopathy Heart: Reg rate and rhythm. No murmurs rubs or gallops Chest: CTA bilaterally without wheezes, rales, or rhonchi; no distress Abdomen: Soft, non-tender, non-distended, bowel sounds positive. Extremities: No clubbing, cyanosis, or edema. Pulses are 2+ Skin: Clean and intact without signs of breakdown Neurologic motor strength is5/5 in bilateral deltoid, bicep, tricep, grip, hip flexor, knee extensors, ankle dorsiflexor and plantar flexor Musculoskeletal: Full range of motion in all 4 extremities. No joint swelling Psych: Pt's affect is appropriate. Pt is cooperative  Assessment/Plan: 1. Functional deficits secondary to debility renal cell carcinoma which require 3+ hours per day of interdisciplinary therapy in a comprehensive inpatient rehab  setting.  Physiatrist is providing close team supervision and 24 hour management of active medical problems listed below.  Physiatrist and rehab team continue to assess barriers to discharge/monitor patient progress toward functional and medical goals  Care Tool:  Bathing    Body parts bathed by patient: Right arm, Left arm, Chest, Abdomen, Front perineal area, Face, Right upper leg, Left upper leg, Buttocks, Right lower leg, Left lower leg   Body parts bathed by helper: Buttocks, Right lower leg, Left lower leg Body parts n/a: Front perineal area, Buttocks, Right upper leg, Left upper leg, Right lower leg, Left lower leg   Bathing assist Assist Level: Supervision/Verbal cueing (bedlevel + w/c level at sink)     Upper Body Dressing/Undressing Upper body dressing   What is the patient wearing?: Pull over shirt    Upper body assist Assist Level: Set up assist    Lower Body Dressing/Undressing Lower body dressing      What is the patient wearing?: Pants, Underwear/pull up     Lower body assist Assist for lower body dressing: Supervision/Verbal cueing     Toileting Toileting    Toileting assist Assist for toileting: Moderate Assistance - Patient 50 - 74%     Transfers Chair/bed transfer  Transfers assist     Chair/bed transfer assist level: Contact Guard/Touching assist Chair/bed transfer assistive device: Programmer, multimedia   Ambulation assist      Assist level: Contact Guard/Touching assist Assistive device: Walker-rolling Max distance: 40   Walk 10 feet activity   Assist     Assist level: Contact Guard/Touching assist  Assistive device: Walker-rolling   Walk 50 feet activity   Assist Walk 50 feet with 2 turns activity did not occur: Safety/medical concerns         Walk 150 feet activity   Assist Walk 150 feet activity did not occur: Safety/medical concerns         Walk 10 feet on uneven surface  activity   Assist  Walk 10 feet on uneven surfaces activity did not occur: Safety/medical concerns         Wheelchair     Assist Will patient use wheelchair at discharge?: No   Wheelchair activity did not occur: Safety/medical concerns         Wheelchair 50 feet with 2 turns activity    Assist    Wheelchair 50 feet with 2 turns activity did not occur: Safety/medical concerns       Wheelchair 150 feet activity     Assist  Wheelchair 150 feet activity did not occur: Safety/medical concerns       Blood pressure (!) 104/59, pulse (!) 104, temperature 98.5 F (36.9 C), temperature source Oral, resp. rate 20, height 6\' 2"  (1.88 m), weight 101 kg, SpO2 99 %.  Medical Problem List and Plan: 1.Functional and mobility deficitssecondary to debility after right nephrectomy and multiple post-op medical complications -patient may not shower until mid Sept due to new diatek cath  -ELOS/Goals: 5-7 days, mod I  Continue CIR 2. Antithrombotics: -DVT/anticoagulation:Mechanical:Sequential compression devices, below kneeBilateral lower extremities -antiplatelet therapy: N/A 3. Pain Management:Tylenol prn. Well controlled 4. Mood:LCSW to follow for evaluation and support. -antipsychotic agents: N/A 5. Neuropsych: This patientiscapable of making decisions onhisown behalf. 6. Skin/Wound Care:Monitor wound for healing. Protein supplements to promote healing. 7. Fluids/Electrolytes/Nutrition:Change to Carb modified/renal diet--not on fluid restriction at this time. Monitor strict I/O as well as daily weights.  9. Persistent Hypotension: -patient asymptomatic. 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 HD TTS--will schedule at the end of the day to help with tolerance of therapy. Appreciate nephrology  note. 2.5L removed Saturday 11. ABLA:Serial labs with HD. On areanesp weekly and s/p iron repletion. Hgb improved to 8.9 on 8/14.  12. New onset A fib with RVR: Monitor HR tid--on low dose metoprolol bid.  Vitals:   06/22/20 0602 06/22/20 0836  BP: (!) 104/59   Pulse: (!) 58 (!) 104  Resp: (!) 22 20  Temp: 98.5 F (36.9 C)   SpO2: 98% 99%  BP soft- had HD Saturday 13. H/o Asthma: Continue pulmicort, brovana and Xopenex nebs  8/9- appeared more uncomfortable- will increase xopenex to q4 hours prn and added O2 prn.   14. T2DM diet controlled: Hgb A1C- 6.2 and controlled. Will monitor BS ac/hs and use SSI for elevated BS--likely due to stress and steroids.  and wean as able.  CBG (last 3)  Recent Labs    06/21/20 1810 06/21/20 2230 06/22/20 0612  GLUCAP 103* 105* 94  may d/c cbg 15.  Loose stools  better-daily and cont LOS: 12 days A FACE TO FACE EVALUATION WAS PERFORMED  Oren Barella P Casey Fye 06/22/2020, 11:12 AM

## 2020-06-22 NOTE — Progress Notes (Signed)
[facility] Progress Note   74 y.o.yo malewith advanced CKD at baseline who was admitted on 8/3/2021with need for nephrectomy  Assessment/ Plan:   1. RCC- Need for nephrectomy - done on 7/9- c/b bleeding and progression to ESRD 2. 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 Will be [facility] on TTS schedule upon discharge  Last  HD 8/14 (UF net 2.75L) pt had dropped and I turned off the UF. try for 2.5 liter net given theDyspnea.Weight currently 102.4kg. EDW s ~100.5  3. Anemia-hgb in the 7's- On Darbe 200 weekly(last given 8/7)and s/p iron repletion 4. Secondary hyperparathyroidism- PTH 116, calc WNL- Last phos high- Have startedphoslo for binding(phos down to 4.3 on 8/7) -> recheck8/11 (6.2) -> incr to3tabs TIDM (appetite likely improving) --> P 3.4 on 8/15 5. HTN/volume- req midodrine for BP support Will inc to max dose as well as cortef. Low dose metoprolol for Afib- UF as able with HD 6. Malnutrition-On supps 7. Dispo- CIR but hope eventually to home with OP HD [facility] TTS   Subjective:   Dyspnea is present but denies n/v/cp. No events overnight; tolerated hd yest   Objective:   BP (!) 104/59 (BP Location: Right Arm)   Pulse (!) 104   Temp 98.5 F (36.9 C) (Oral)   Resp 20   Ht 6\' 2"  (1.88 m) Comment: stated  Wt 101 kg   SpO2 99%   BMI 28.59 kg/m   Intake/Output Summary (Last 24 hours) at 06/22/2020 1202 Last data filed at 06/22/2020 0215 Gross per 24 hour  Intake 390 ml  Output 2750 ml  Net -2360 ml   Weight change: 0 kg  Physical Exam: General: obese, pleasant- Minimal mobility- NAD Heart: RRR Lungs: mostly clear Abdomen: obese, soft, non tender Extremities: min edema Dialysis Access: leftFALAVG+ bruit- right TDC  Imaging: No results found.  Labs: BMET Recent Labs  Lab 06/17/20 1344 06/19/20 1437  06/21/20 1313 06/22/20 0442  NA 134* 136 137  --   K 4.7 4.5 4.0  --   CL 95* 97* 98  --   CO2 23 26 25   --   GLUCOSE 124* 104* 107*  --   BUN 52* 37* 30*  --   CREATININE 11.22* 10.01* 9.19*  --   CALCIUM 8.4* 8.9 8.8*  --   PHOS 6.2* 5.1* 4.5 3.4   CBC Recent Labs  Lab 06/17/20 1344 06/19/20 1436 06/21/20 1313  WBC 14.5* 10.6* 9.0  HGB 8.3* 8.3* 8.9*  HCT 27.8* 27.9* 28.7*  MCV 97.9 99.6 100.0  PLT 197 165 163    Medications:    . arformoterol  15 mcg Nebulization BID  . atorvastatin  40 mg Oral Daily  . budesonide (PULMICORT) nebulizer solution  0.5 mg Nebulization BID  . calcium acetate  2,001 mg Oral TID WC  . Chlorhexidine Gluconate Cloth  6 each Topical Q0600  . darbepoetin (ARANESP) injection - DIALYSIS  200 mcg Intravenous Q Sat-HD  . fluticasone  2 spray Each Nare Daily  . hydrocortisone  10 mg Oral BID WC  . insulin aspart  0-5 Units Subcutaneous QHS  . insulin aspart  0-9 Units Subcutaneous TID WC  . insulin detemir  7 Units Subcutaneous QHS  . mouth rinse  15 mL Mouth Rinse q12n4p  . metoprolol tartrate  12.5 mg Oral BID  . midodrine  10 mg Oral TID WC  . multivitamin  1 tablet Oral QHS  . psyllium  1 packet Oral Daily      Otelia Santee, MD 06/22/2020, 12:02 PM

## 2020-06-23 ENCOUNTER — Inpatient Hospital Stay (HOSPITAL_COMMUNITY): Payer: Medicare Other | Admitting: Occupational Therapy

## 2020-06-23 ENCOUNTER — Encounter (HOSPITAL_COMMUNITY): Payer: Medicare Other | Admitting: Occupational Therapy

## 2020-06-23 ENCOUNTER — Ambulatory Visit (HOSPITAL_COMMUNITY): Payer: Medicare Other | Admitting: Physical Therapy

## 2020-06-23 LAB — GLUCOSE, CAPILLARY
Glucose-Capillary: 97 mg/dL (ref 70–99)
Glucose-Capillary: 101 mg/dL — ABNORMAL HIGH (ref 70–99)
Glucose-Capillary: 108 mg/dL — ABNORMAL HIGH (ref 70–99)
Glucose-Capillary: 105 mg/dL — ABNORMAL HIGH (ref 70–99)

## 2020-06-23 NOTE — Progress Notes (Signed)
Patient ID: Todd Robertson, male   DOB: [birth year], 74 y.o.   MRN: [id number]   Pt TTB ordered through [facility].

## 2020-06-23 NOTE — Progress Notes (Signed)
Patient ID: Todd Robertson, male   DOB: [birth year], 74 y.o.   MRN: [id number]   Pt follow up [facility] orders sent to [facility], awaiting decision.

## 2020-06-23 NOTE — Progress Notes (Signed)
Occupational Therapy Session Note  Patient Details  Name: OLA FAWVER MRN: [id number] Date of Birth: [birth year]  Today's Date: 06/23/2020 OT Individual Time: 4175-3010 OT Individual Time Calculation (min): 55 min    Short Term Goals: Week 2:  OT Short Term Goal 1 (Week 2): STGs=LTGs due to ELOS  Skilled Therapeutic Interventions/Progress Updates:    Patient in bed, alert and ready for therapy session.   Patient's wife and daughter present for family education session.   He denies pain or difficulty with breathing this am.  Supine to sitting mod I.  Sit to stand and short distance ambulation with RW CS bed to w/c.  Completed grooming/oral care, UB bathing and dressing with set up.  LB bathing and dressing CS seated in w/c at sink.  Reviewed energy conservation techniques, home set up, supervision needs, use of TEDs stockings (MD confirmed to use for edema and w/ c/o of dizziness)   teds donned this am.  Reviewed use of shower shield over port site - patient declined shower this am.  Family would like tub bench ordered for future use.  Wife and daughter demonstrate good understanding and awareness of patient needs.  Patient remained seated in w/c at close of session with needs met.    Therapy Documentation Precautions:  Precautions Precautions: Fall Restrictions Weight Bearing Restrictions: No  Therapy/Group: Individual Therapy  Carlos Levering 06/23/2020, 7:32 AM

## 2020-06-23 NOTE — Progress Notes (Signed)
Physical Therapy Session Note  Patient Details  Name: Todd Robertson MRN: [id number] Date of Birth: 02-Jan-1946  Today's Date: 06/23/2020 PT Individual Time: 0930-1020 PT Individual Time Calculation (min): 50 min   Short Term Goals: Week 1:  PT Short Term Goal 1 (Week 1): None due to ELOS  Skilled Therapeutic Interventions/Progress Updates: Pt presented in w/c with NT present taking vitals. BP 83/49 HR 40 with pt symptomatic thus activities with pt limited this session. Session focused on family education with emphasis on monitoring for hypotension and requiring rest breaks as needed. PTA explained that at times movements need to be broken up ie getting OOB/sitting at EOB for 2-3 minutes before standing and performing task. PTA also had family go to rehab gym and PTA demonstrated stepping onto 4 in step with RW with PTA emphasizing safe use of RW.  Family returned to room and BP checked again with improved reading however pt continuing to state feeling "swimmy". Pt agreeable to try car transfer with family. Pt transported to ortho gym and participated in car transfer without AD at supervision level with verbal cues for sequencing as pt placed one foot in and leaned to step into vehicle. Discussed sitting and rotating due to pt's poor endurance and hypotensive occurences with pt and family voicing understanding. Pt transported back to room and performed ambulatory transfer back to flat bed without rails at bed height similar to home set up. Pt returned to bed mod I and repositioned self to comfort. Pt left in bed with alarm on, call bell within reach and family present.      Therapy Documentation Precautions:  Precautions Precautions: Fall Restrictions Weight Bearing Restrictions: No General:   Vital Signs: Therapy Vitals Temp: 99 F (37.2 C) Pulse Rate: 98 Resp: 17 BP: 129/74 Patient Position (if appropriate): Lying Oxygen Therapy SpO2: 99 % O2 Device: Room Air Pain: Pain  Assessment Pain Scale: 0-10 Pain Score: 1  Pain Type: Acute pain Pain Location: Head Pain Descriptors / Indicators: Headache Pain Onset: Gradual Patients Stated Pain Goal: 0 Pain Intervention(s): Medication (See eMAR) Mobility:   Locomotion :    Trunk/Postural Assessment :    Balance: Balance Balance Assessed: Yes Dynamic Sitting Balance Dynamic Sitting - Balance Support: Feet supported;During functional activity Dynamic Sitting - Level of Assistance: 5: Stand by assistance (TTB transfer) Dynamic Standing Balance Dynamic Standing - Balance Support: Bilateral upper extremity supported;During functional activity Dynamic Standing - Level of Assistance: 5: Stand by assistance (toilet transfer using RW) Exercises:   Other Treatments:      Therapy/Group: Individual Therapy  [name] 06/23/2020, 4:27 PM

## 2020-06-23 NOTE — Plan of Care (Signed)
  Problem: Consults Goal: RH GENERAL PATIENT EDUCATION Description: See Patient Education module for education specifics. Outcome: Progressing Goal: Skin Care Protocol Initiated - if Braden Score 18 or less Description: If consults are not indicated, leave blank or document N/A Outcome: Progressing Goal: Nutrition Consult-if indicated Outcome: Progressing Goal: Diabetes Guidelines if Diabetic/Glucose > 140 Description: If diabetic or lab glucose is > 140 mg/dl - Initiate Diabetes/Hyperglycemia Guidelines & Document Interventions  Outcome: Progressing   Problem: RH BOWEL ELIMINATION Goal: RH STG MANAGE BOWEL WITH ASSISTANCE Description: STG Manage Bowel with mod I Assistance. Outcome: Progressing Goal: RH STG MANAGE BOWEL W/MEDICATION W/ASSISTANCE Description: STG Manage Bowel with Medication with mod I Assistance. Outcome: Progressing   Problem: RH SKIN INTEGRITY Goal: RH STG SKIN FREE OF INFECTION/BREAKDOWN Description: Will stay free of infection or new pressure areas  Outcome: Progressing Goal: RH STG MAINTAIN SKIN INTEGRITY WITH ASSISTANCE Description: STG Maintain Skin Integrity With min Assistance. Outcome: Progressing Goal: RH STG ABLE TO PERFORM INCISION/WOUND CARE W/ASSISTANCE Description: STG Able To Perform Incision/Wound Care With min Assistance. Outcome: Progressing   Problem: RH SAFETY Goal: RH STG ADHERE TO SAFETY PRECAUTIONS W/ASSISTANCE/DEVICE Description: STG Adhere to Safety Precautions With cues/reminders Assistance/Device. Outcome: Progressing   Problem: RH PAIN MANAGEMENT Goal: RH STG PAIN MANAGED AT OR BELOW PT'S PAIN GOAL Description: Pain scale <3/10 Outcome: Progressing   Problem: RH KNOWLEDGE DEFICIT GENERAL Goal: RH STG INCREASE KNOWLEDGE OF SELF CARE AFTER HOSPITALIZATION Description: Patient will have knowledge of illnesses & precautions to take against exacerbation with cues/reminders Outcome: Progressing

## 2020-06-23 NOTE — Progress Notes (Signed)
Occupational Therapy Session Note  Patient Details  Name: Todd Robertson MRN: [id number] Date of Birth: October 10, 1946  Today's Date: 06/23/2020 OT Individual Time: 1420-1521 OT Individual Time Calculation (min): 61 min  14 minutes missed due to pt fatigue  Short Term Goals: Week 2:  OT Short Term Goal 1 (Week 2): STGs=LTGs due to ELOS  Skilled Therapeutic Interventions/Progress Updates:    Pt greeted in bed with no c/o pain, however feeling "terrible" with s/s orthostatics and nausea. Spouse [name] present for family education. Supine<sit completed from flat bed without bedrails with setup assist. Supervision for stand pivot<w/c where BP was then assessed, reading 104/69 (WNL). Pt was escorted via w/c down to the tub shower room. Candidly discussed with spouse that pt will most likely be sponge bathing at home for a long period of time before he feels ready to shower. Demonstrated and explained transfer technique using RW and then pt practiced this transfer with spouse providing supervision assist. Recommended having nonslip tread for the shower floor and aiming overhead shower hose away from pts HD port site. Showed spouse the shower shield and demonstrated how to cover HD port in prep for showers at home. Advised to keep the bathroom door ajar to help increase ventilation as well. Back in the room, spouse had practice supervising pt during toilet transfers and toileting tasks using RW. Pt able to engage in simulated hygiene and complete pants up/down with close supervision for balance. Orthostatic symptoms increased at this time so BP was assessed, reading 123/72 (WNL). After he rested for a few minutes, he returned to the w/c using RW with spouse supervising transfer. Reviewed with [name] w/c parts mgt and safety in prep for transfers. She assisted pt with stand pivot<bed with bed elevated to their height at home (fairly high). Pt transitioned to supine without bedrail use and no additional physical  assist needed. Provided him with lavender aromatherapy to address HA. Spouse had no further questions from OT regarding ADLs at home. Left pt with all needs within reach and bed alarm set. Time missed due to pt fatigue.   Therapy Documentation Precautions:  Precautions Precautions: Fall Restrictions Weight Bearing Restrictions: No Vital Signs: Therapy Vitals Temp: 99 F (37.2 C) Pulse Rate: 98 Resp: 17 BP: 129/74 Patient Position (if appropriate): Lying Oxygen Therapy SpO2: 99 % O2 Device: Room Air Pain: Pain Assessment Pain Scale: 0-10 Pain Score: 3  Pain Type: Acute pain Pain Location: Head Pain Descriptors / Indicators: Headache Pain Onset: Gradual Patients Stated Pain Goal: 0 Pain Intervention(s): Medication (See eMAR) ADL: ADL Eating: Independent Grooming: Independent Where Assessed-Grooming: Sitting at sink Upper Body Bathing: Setup Where Assessed-Upper Body Bathing: Sitting at sink Lower Body Bathing: Supervision/safety Where Assessed-Lower Body Bathing: Sitting at sink, Standing at sink Upper Body Dressing: Setup Where Assessed-Upper Body Dressing: Sitting at sink Lower Body Dressing: Supervision/safety Where Assessed-Lower Body Dressing: Sitting at sink, Standing at sink Toileting: Supervision/safety Where Assessed-Toileting: Glass blower/designer: Close supervision Toilet Transfer Method: Stand pivot (RW) Science writer: Raised toilet seat Tub/Shower Transfer: Close supervison Clinical cytogeneticist Method: Stand pivot (RW) Tub/Shower Equipment: Radio broadcast assistant      Therapy/Group: Individual Therapy  Tariana Moldovan A Edgar Corrigan 06/23/2020, 3:57 PM

## 2020-06-23 NOTE — Progress Notes (Signed)
°  [facility] Progress Note   74 y.o.yo malewith advanced CKD at baseline who was admitted on 8/3/2021with need for nephrectomy  Assessment/ Plan:   1. RCC- Need for nephrectomy - done on 7/9- c/b bleeding and progression to ESRD 2. 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 Will be [facility] [place] on TTS schedule upon discharge.  Difficulties with hypotension on dialysis.  On midodrine and Cortef.  EDW s ~100.5 3. Anemia-hgb in the 7's- On Darbe 200 weekly(last given 8/7)and s/p iron repletion 4. Secondary hyperparathyroidism- PTH 116, calc WNL-continue PhosLo and obtain phosphorus tomorrow. 5.  Hypotension/volume-continue midodrine and Cortef.  Ultrafiltration as able 6. Malnutrition-On supps 7. Dispo- CIR but hope eventually to home with OP HD [facility]  TTS   Subjective:   Patient states he feels slightly lightheaded sitting in the chair but otherwise feels well.  Denies any other significant complaints.     Objective:   BP (!) 98/53    Pulse 92    Temp 98.1 F (36.7 C) (Oral)    Resp 18    Ht 6\' 2"  (1.88 m) Comment: stated   Wt 101.2 kg    SpO2 96%    BMI 28.65 kg/m   Intake/Output Summary (Last 24 hours) at 06/23/2020 1306 Last data filed at 06/23/2020 0800 Gross per 24 hour  Intake 712 ml  Output 0 ml  Net 712 ml   Weight change: -1.5 kg  Physical Exam: General: obese, pleasant- Minimal mobility- NAD Heart: RRR Lungs: mostly clear Abdomen: obese, soft, non tender Extremities: min edema Dialysis Access: leftFALAVG+ bruit- right TDC  Imaging: No results found.  Labs: BMET Recent Labs  Lab 06/17/20 1344 06/19/20 1437 06/21/20 1313 06/22/20 0442  NA 134* 136 137  --   K 4.7 4.5 4.0  --   CL 95* 97* 98  --   CO2 23 26 25   --   GLUCOSE 124* 104* 107*  --   BUN 52* 37* 30*  --   CREATININE 11.22* 10.01* 9.19*  --   CALCIUM 8.4* 8.9 8.8*   --   PHOS 6.2* 5.1* 4.5 3.4   CBC Recent Labs  Lab 06/17/20 1344 06/19/20 1436 06/21/20 1313  WBC 14.5* 10.6* 9.0  HGB 8.3* 8.3* 8.9*  HCT 27.8* 27.9* 28.7*  MCV 97.9 99.6 100.0  PLT 197 165 163    Medications:     arformoterol  15 mcg Nebulization BID   atorvastatin  40 mg Oral Daily   budesonide (PULMICORT) nebulizer solution  0.5 mg Nebulization BID   calcium acetate  2,001 mg Oral TID WC   Chlorhexidine Gluconate Cloth  6 each Topical Q0600   darbepoetin (ARANESP) injection - DIALYSIS  200 mcg Intravenous Q Sat-HD   fluticasone  2 spray Each Nare Daily   hydrocortisone  10 mg Oral BID WC   insulin aspart  0-5 Units Subcutaneous QHS   insulin aspart  0-9 Units Subcutaneous TID WC   insulin detemir  7 Units Subcutaneous QHS   mouth rinse  15 mL Mouth Rinse q12n4p   metoprolol tartrate  12.5 mg Oral BID   midodrine  10 mg Oral TID WC   multivitamin  1 tablet Oral QHS   psyllium  1 packet Oral Daily      Reesa Chew  06/23/2020, 1:06 PM

## 2020-06-23 NOTE — Progress Notes (Signed)
[facility] PHYSICAL MEDICINE & REHABILITATION PROGRESS NOTE   Subjective/Complaints: Nauseous last night and refused dinner. Feeling a little nauseous this morning but says he was able to tolerate breakfast. Therapy says he has been satting well during sessions Family has questions.   ROS: Pt denies SOB, abd pain, CP, N/V/C/D  Objective:   No results found. Recent Labs    06/21/20 1313  WBC 9.0  HGB 8.9*  HCT 28.7*  PLT 163   Recent Labs    06/21/20 1313  NA 137  K 4.0  CL 98  CO2 25  GLUCOSE 107*  BUN 30*  CREATININE 9.19*  CALCIUM 8.8*    Intake/Output Summary (Last 24 hours) at 06/23/2020 0841 Last data filed at 06/23/2020 0100 Gross per 24 hour  Intake 610 ml  Output 0 ml  Net 610 ml     Physical Exam: Vital Signs Blood pressure (!) 98/53, pulse 92, temperature 98.1 F (36.7 C), temperature source Oral, resp. rate 19, height 6\' 2"  (1.88 m), weight 101.2 kg, SpO2 96 %. General: Alert and oriented x 3, No apparent distress HEENT: Head is normocephalic, atraumatic, PERRLA, EOMI, sclera anicteric, oral mucosa pink and moist, dentition intact, ext ear canals clear,  Neck: Supple without JVD or lymphadenopathy Heart: Reg rate and rhythm. No murmurs rubs or gallops Chest: CTA bilaterally without wheezes, rales, or rhonchi; no distress Abdomen: Soft, non-tender, non-distended, bowel sounds positive. Extremities: No clubbing, cyanosis, or edema. Pulses are 2+ Skin: Clean and intact without signs of breakdown Neurologic motor strength is5/5 in bilateral deltoid, bicep, tricep, grip, hip flexor, knee extensors, ankle dorsiflexor and plantar flexor Musculoskeletal: Full range of motion in all 4 extremities. No joint swelling Psych: Pt's affect is appropriate. Pt is cooperative   Assessment/Plan: 1. Functional deficits secondary to debility renal cell carcinoma which require 3+ hours per day of interdisciplinary therapy in a comprehensive inpatient rehab  setting.  Physiatrist is providing close team supervision and 24 hour management of active medical problems listed below.  Physiatrist and rehab team continue to assess barriers to discharge/monitor patient progress toward functional and medical goals  Care Tool:  Bathing    Body parts bathed by patient: Right arm, Left arm, Chest, Abdomen, Front perineal area, Face, Right upper leg, Left upper leg, Buttocks, Right lower leg, Left lower leg   Body parts bathed by helper: Buttocks, Right lower leg, Left lower leg Body parts n/a: Front perineal area, Buttocks, Right upper leg, Left upper leg, Right lower leg, Left lower leg   Bathing assist Assist Level: Supervision/Verbal cueing (bedlevel + w/c level at sink)     Upper Body Dressing/Undressing Upper body dressing   What is the patient wearing?: Pull over shirt    Upper body assist Assist Level: Set up assist    Lower Body Dressing/Undressing Lower body dressing      What is the patient wearing?: Pants, Underwear/pull up     Lower body assist Assist for lower body dressing: Supervision/Verbal cueing     Toileting Toileting    Toileting assist Assist for toileting: Moderate Assistance - Patient 50 - 74%     Transfers Chair/bed transfer  Transfers assist     Chair/bed transfer assist level: Contact Guard/Touching assist Chair/bed transfer assistive device: Programmer, multimedia   Ambulation assist      Assist level: Contact Guard/Touching assist Assistive device: Walker-rolling Max distance: 40   Walk 10 feet activity   Assist     Assist level: Contact  Guard/Touching assist Assistive device: Walker-rolling   Walk 50 feet activity   Assist Walk 50 feet with 2 turns activity did not occur: Safety/medical concerns         Walk 150 feet activity   Assist Walk 150 feet activity did not occur: Safety/medical concerns         Walk 10 feet on uneven surface  activity   Assist  Walk 10 feet on uneven surfaces activity did not occur: Safety/medical concerns         Wheelchair     Assist Will patient use wheelchair at discharge?: No   Wheelchair activity did not occur: Safety/medical concerns         Wheelchair 50 feet with 2 turns activity    Assist    Wheelchair 50 feet with 2 turns activity did not occur: Safety/medical concerns       Wheelchair 150 feet activity     Assist  Wheelchair 150 feet activity did not occur: Safety/medical concerns       Blood pressure (!) 98/53, pulse 92, temperature 98.1 F (36.7 C), temperature source Oral, resp. rate 19, height 6\' 2"  (1.88 m), weight 101.2 kg, SpO2 96 %.  Medical Problem List and Plan: 1.Functional and mobility deficitssecondary to debility after right nephrectomy and multiple post-op medical complications -patient may not shower until mid Sept due to new diatek cath  -ELOS/Goals: 5-7 days, mod I  Continue CIR 2. Antithrombotics: -DVT/anticoagulation:Mechanical:Sequential compression devices, below kneeBilateral lower extremities -antiplatelet therapy: N/A 3. Pain Management:Tylenol prn. Well controlled.  4. Mood:LCSW to follow for evaluation and support. -antipsychotic agents: N/A 5. Neuropsych: This patientiscapable of making decisions onhisown behalf. 6. Skin/Wound Care:Monitor wound for healing. Protein supplements to promote healing. 7. Fluids/Electrolytes/Nutrition:Change to Carb modified/renal diet--not on fluid restriction at this time. Monitor strict I/O as well as daily weights.  9. Persistent Hypotension: -patient asymptomatic. 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 HD TTS--will schedule at the end of the day to help with tolerance of therapy. Appreciate nephrology note.  2.5L removed Saturday 11. ABLA:Serial labs with HD. On areanesp weekly and s/p iron repletion. Hgb improved to 8.9 on 8/14.  12. New onset A fib with RVR: Monitor HR tid--on low dose metoprolol bid.  Vitals:   06/23/20 0542 06/23/20 0827  BP: (!) 96/54 (!) 98/53  Pulse: 92   Resp: 19   Temp: 98.1 F (36.7 C)   SpO2:    BP soft- had HD Saturday. Discussed with wife and daughter to check BP at home daily and when symptomatic and record values and symptoms and bring in to f/u appointment.  13. H/o Asthma: Continue pulmicort, brovana and Xopenex nebs  8/9- appeared more uncomfortable- will increase xopenex to q4 hours prn and added O2 prn.   14. T2DM diet controlled: Hgb A1C- 6.2 and controlled. Will monitor BS ac/hs and use SSI for elevated BS--likely due to stress and steroids.  and wean as able.  CBG (last 3)  Recent Labs    06/22/20 1619 06/22/20 2119 06/23/20 0616  GLUCAP 145* 115* 97  may d/c cbg 15.  Loose stools  better-daily and cont 16. Nausea: Improved with prn medication LOS: 13 days A FACE TO FACE EVALUATION WAS PERFORMED  Clide Deutscher Smayan Hackbart 06/23/2020, 8:41 AM

## 2020-06-23 NOTE — Progress Notes (Signed)
Patient ID: Todd Robertson, male   DOB: 1946-05-18, 74 y.o.   MRN: [id number]   Patient WC ordered through [facility]

## 2020-06-23 NOTE — Progress Notes (Signed)
Occupational Therapy Discharge Summary  Patient Details  Name: Todd Robertson MRN: [id number] Date of Birth: 07-07-1946  Patient has met 8 of 8 long term goals due to improved activity tolerance, improved balance, ability to compensate for deficits and improved coordination.  Patient to discharge at overall Supervision level.  Patient's daughter and spouse have attended family education to provide the necessary assistance at discharge.    All goals met.   Recommendation:  Patient will benefit from ongoing skilled OT services in home health setting to continue to advance functional skills in the area of BADL and iADL.  Equipment: TTB  Reasons for discharge: treatment goals met and discharge from hospital  Patient/family agrees with progress made and goals achieved: Yes  OT Discharge Vital Signs Therapy Vitals Temp: 99 F (37.2 C) Pulse Rate: 98 Resp: 17 BP: 129/74 Patient Position (if appropriate): Lying Oxygen Therapy SpO2: 99 % O2 Device: Room Air Pain Pain Assessment Pain Scale: 0-10 Pain Score: 3  Pain Type: Acute pain Pain Location: Head Pain Descriptors / Indicators: Headache Pain Onset: Gradual Patients Stated Pain Goal: 0 Pain Intervention(s): Medication (See eMAR) ADL ADL Eating: Independent Grooming: Independent Where Assessed-Grooming: Sitting at sink Upper Body Bathing: Setup Where Assessed-Upper Body Bathing: Sitting at sink Lower Body Bathing: Supervision/safety Where Assessed-Lower Body Bathing: Sitting at sink, Standing at sink Upper Body Dressing: Setup Where Assessed-Upper Body Dressing: Sitting at sink Lower Body Dressing: Supervision/safety Where Assessed-Lower Body Dressing: Sitting at sink, Standing at sink Toileting: Supervision/safety Where Assessed-Toileting: Glass blower/designer: Close supervision Toilet Transfer Method: Stand pivot (RW) Science writer: Raised toilet seat Tub/Shower Transfer: Close  supervison Clinical cytogeneticist Method: Stand pivot (RW) Tub/Shower Equipment: Tourist information centre manager Orientation Level: Oriented X4 Safety/Judgment: Appears intact Motor  Motor Motor: Within Functional Limits Mobility    Supervision stand pivot bathroom transfers using RW Balance Balance Balance Assessed: Yes Dynamic Sitting Balance Dynamic Sitting - Balance Support: Feet supported;During functional activity Dynamic Sitting - Level of Assistance: 5: Stand by assistance (TTB transfer) Dynamic Standing Balance Dynamic Standing - Balance Support: Bilateral upper extremity supported;During functional activity Dynamic Standing - Level of Assistance: 5: Stand by assistance (toilet transfer using RW) Extremity/Trunk Assessment RUE Assessment RUE Assessment: Within Functional Limits LUE Assessment LUE Assessment: Within Functional Limits   Michaela A Hoffman 06/23/2020, 3:44 PM

## 2020-06-24 ENCOUNTER — Inpatient Hospital Stay (HOSPITAL_COMMUNITY): Payer: Medicare Other | Admitting: Physical Therapy

## 2020-06-24 ENCOUNTER — Inpatient Hospital Stay (HOSPITAL_COMMUNITY): Payer: Medicare Other | Admitting: Occupational Therapy

## 2020-06-24 LAB — RENAL FUNCTION PANEL
Albumin: 2.9 g/dL — ABNORMAL LOW (ref 3.5–5.0)
Anion gap: 18 — ABNORMAL HIGH (ref 5–15)
BUN: 48 mg/dL — ABNORMAL HIGH (ref 8–23)
CO2: 25 mmol/L (ref 22–32)
Calcium: 9.5 mg/dL (ref 8.9–10.3)
Chloride: 92 mmol/L — ABNORMAL LOW (ref 98–111)
Creatinine, Ser: 11 mg/dL — ABNORMAL HIGH (ref 0.61–1.24)
GFR calc Af Amer: 5 mL/min — ABNORMAL LOW (ref >60)
GFR calc non Af Amer: 4 mL/min — ABNORMAL LOW (ref >60)
Glucose, Bld: 95 mg/dL (ref 70–99)
Phosphorus: 4.3 mg/dL (ref 2.5–4.6)
Potassium: 4.5 mmol/L (ref 3.5–5.1)
Sodium: 135 mmol/L (ref 135–145)

## 2020-06-24 LAB — GLUCOSE, CAPILLARY
Glucose-Capillary: 94 mg/dL (ref 70–99)
Glucose-Capillary: 115 mg/dL — ABNORMAL HIGH (ref 70–99)
Glucose-Capillary: 86 mg/dL (ref 70–99)
Glucose-Capillary: 81 mg/dL (ref 70–99)

## 2020-06-24 LAB — CBC
HCT: 32.2 % — ABNORMAL LOW (ref 39.0–52.0)
Hemoglobin: 9.7 g/dL — ABNORMAL LOW (ref 13.0–17.0)
MCH: 30.2 pg (ref 26.0–34.0)
MCHC: 30.1 g/dL (ref 30.0–36.0)
MCV: 100.3 fL — ABNORMAL HIGH (ref 80.0–100.0)
Platelets: 183 10*3/uL (ref 150–400)
RBC: 3.21 MIL/uL — ABNORMAL LOW (ref 4.22–5.81)
RDW: 20.2 % — ABNORMAL HIGH (ref 11.5–15.5)
WBC: 13.5 10*3/uL — ABNORMAL HIGH (ref 4.0–10.5)
nRBC: 0 % (ref 0.0–0.2)

## 2020-06-24 MED ORDER — RENA-VITE PO TABS: 1.0000 | ORAL_TABLET | Freq: Every day | ORAL | 0 refills | Status: DC

## 2020-06-24 MED ORDER — METOPROLOL TARTRATE 25 MG PO TABS: 12.5000 mg | ORAL_TABLET | Freq: Two times a day (BID) | ORAL | 0 refills | Status: DC

## 2020-06-24 MED ORDER — MIDODRINE HCL 10 MG PO TABS: 10.0000 mg | ORAL_TABLET | Freq: Three times a day (TID) | ORAL | 0 refills | Status: DC

## 2020-06-24 MED ORDER — ATORVASTATIN CALCIUM 40 MG PO TABS
40.0000 mg | ORAL_TABLET | Freq: Every day | ORAL | 3 refills | Status: DC
Start: 2020-06-24 — End: 2024-08-15

## 2020-06-24 MED ORDER — CALCIUM ACETATE (PHOS BINDER) 667 MG PO CAPS: 2001.0000 mg | ORAL_CAPSULE | Freq: Three times a day (TID) | ORAL | 1 refills | Status: DC

## 2020-06-24 MED ORDER — VITAMIN D 50 MCG (2000 UT) PO TABS: 2000.0000 [IU] | ORAL_TABLET | Freq: Every day | ORAL | 0 refills | Status: DC

## 2020-06-24 MED ORDER — ALBUTEROL SULFATE HFA 108 (90 BASE) MCG/ACT IN AERS: 1.0000 | INHALATION_SPRAY | Freq: Four times a day (QID) | RESPIRATORY_TRACT | 1 refills | Status: DC | PRN

## 2020-06-24 MED ORDER — ACETAMINOPHEN 325 MG PO TABS: 325.0000 mg | ORAL_TABLET | ORAL | Status: DC | PRN

## 2020-06-24 MED ORDER — HYDROCORTISONE 10 MG PO TABS: 10.0000 mg | ORAL_TABLET | Freq: Two times a day (BID) | ORAL | 0 refills | Status: DC

## 2020-06-24 MED ORDER — HEPARIN SODIUM (PORCINE) 1000 UNIT/ML IJ SOLN
INTRAMUSCULAR | Status: AC
  Administered 2020-06-24: 3800 [IU] via INTRAVENOUS_CENTRAL
  Filled 2020-06-24: qty 4

## 2020-06-24 MED ORDER — INSULIN DETEMIR 100 UNIT/ML ~~LOC~~ SOLN
4.0000 [IU] | Freq: Every day | SUBCUTANEOUS | Status: DC
  Filled 2020-06-24: qty 0.04

## 2020-06-24 MED ORDER — BUDESONIDE-FORMOTEROL FUMARATE 160-4.5 MCG/ACT IN AERO: 2.0000 | INHALATION_SPRAY | Freq: Two times a day (BID) | RESPIRATORY_TRACT | 12 refills | Status: DC

## 2020-06-24 MED ORDER — INSULIN DETEMIR 100 UNIT/ML ~~LOC~~ SOLN
6.0000 [IU] | Freq: Every day | SUBCUTANEOUS | Status: DC
  Filled 2020-06-24: qty 0.06

## 2020-06-24 NOTE — Progress Notes (Signed)
Occupational Therapy Session Note  Patient Details  Name: Todd Robertson MRN: [id number] Date of Birth: 1946-10-19  Today's Date: 06/24/2020 OT Individual Time: 1916-6060   &   1045-1130 OT Individual Time Calculation (min): 60 min &   45 min  Missed 30 minutes of OT session this am due to fatigue, will attempt to make up time pending dialysis schedule.     Short Term Goals: Week 2:  OT Short Term Goal 1 (Week 2): STGs=LTGs due to ELOS  Skilled Therapeutic Interventions/Progress Updates:    Session 1:   Patient alert and ready for therapy this am.  He notes that he did not sleep well, but denies pain.  He reports that his breathing is a little better.  Supine to sitting edge of bed mod I.  Sit to stand and short distance ambulation in room to/from bed and w/c with RW CS.   Grooming and UB bathing and dressing mod I seated at sink.  LB bathing and dressing CS, with exception of donning teds - max A.  CM in stance CS.   Completed UB ergometer 2 x 3 minutes, reviewed DBT, used incentive spirometer.  He remained seated in w/c at close of session with call bell in reach.     Session 2:   Patient seated on toilet, continent of bowel.  He is able to complete hygiene and clothing management DS.  He ambulates with RW toilet to bed with CS.  Provided with washcloth for hand hygiene.   Patient denies pain but notes significant fatigue and mild nausea.   Completed supine UE AROM exercises 3 x 10 reps of each shoulder exercise - no resistance.  Bed mobility mod I.   SPT to w/c with CS.  Short distance ambulation with RW to/from NuStep - tolerated 3 minutes on level 1 of NuStep with LEs only.  Returned to bed at close of session due to ongoing fatigue and request to rest before dialysis.  Bed alarm set and callbell in reach.  He states that he is pleased with his progress to date and ready to go home.  Reviewed [facility] therapy and goals for future.      Therapy Documentation Precautions:   Precautions Precautions: Fall Restrictions Weight Bearing Restrictions: No   Therapy/Group: Individual Therapy  Carlos Levering 06/24/2020, 7:28 AM

## 2020-06-24 NOTE — Progress Notes (Signed)
Physical Therapy Discharge Summary  Patient Details  Name: Todd Robertson MRN: [id number] Date of Birth: 12-Dec-1945  Today's Date: 06/24/2020    Patient has met 8 of 10 long term goals due to improved activity tolerance, improved balance, improved postural control, improved awareness and improved coordination.  Patient to discharge at an ambulatory level Supervision.   Patient's care partner is independent to provide the necessary physical assistance at discharge.  Reasons goals not met: Pt continues to be limited by poor endurance threshold and episodes of hypotension therefore pt was unable to consistently achieve distance of 3f ambulation and required 5-7 minutes after ambulation for recovery.   Recommendation:  Patient will benefit from ongoing skilled PT services in home health setting to continue to advance safe functional mobility, address ongoing impairments in balance safety, endurance, transfers, strength and minimize fall risk.  Equipment: 18x18 WC  Reasons for discharge: treatment goals met  Patient/family agrees with progress made and goals achieved: Yes  PT Discharge Precautions/Restrictions Precautions Precautions: Fall Restrictions Weight Bearing Restrictions: No Vital Signs Therapy Vitals Pulse Rate: 62 Resp: 20 Patient Position (if appropriate): Sitting Oxygen Therapy SpO2: 96 % O2 Device: Room Air Pain Pain Assessment Pain Scale: 0-10 Pain Score: 0-No pain Pain Type: Acute pain Pain Location: Head Pain Descriptors / Indicators: Aching Patients Stated Pain Goal: 0 Pain Intervention(s): Medication (See eMAR) Vision/Perception    WFL Cognition Overall Cognitive Status: Within Functional Limits for tasks assessed Arousal/Alertness: Awake/alert Orientation Level: Oriented X4 Sensation Sensation Light Touch: Appears Intact Proprioception: Appears Intact Coordination Gross Motor Movements are Fluid and Coordinated: Yes Motor  Motor Motor:  Within Functional Limits  Mobility Bed Mobility Bed Mobility: Supine to Sit Supine to Sit: Independent Transfers Transfers: Sit to Stand;Stand to Sit;Stand Pivot Transfers Sit to Stand: Independent with assistive device Stand to Sit: Independent with assistive device Stand Pivot Transfers: Independent with assistive device Transfer (Assistive device): Rolling walker Locomotion  Gait Ambulation: Yes Gait Assistance: Supervision/Verbal cueing Gait Distance (Feet): 50 Feet Assistive device: Rolling walker Gait Gait: Yes Gait Pattern: Impaired Gait Pattern: Trunk flexed Stairs / Additional Locomotion Stairs: No Curb: Supervision/Verbal cueing Wheelchair Mobility Wheelchair Mobility: No  Trunk/Postural Assessment  Cervical Assessment Cervical Assessment: Exceptions to [facility](forward head) Thoracic Assessment Thoracic Assessment: Exceptions to [facility](rounded shoulders) Lumbar Assessment Lumbar Assessment: Exceptions to [facility](posterior pelvic tilt) Postural Control Postural Control: Deficits on evaluation  Balance Balance Balance Assessed: Yes Static Sitting Balance Static Sitting - Balance Support: Feet supported Static Sitting - Level of Assistance: 7: Independent Dynamic Sitting Balance Dynamic Sitting - Level of Assistance: 5: Stand by assistance Static Standing Balance Static Standing - Balance Support: Bilateral upper extremity supported Static Standing - Level of Assistance: 6: Modified independent (Device/Increase time) Dynamic Standing Balance Dynamic Standing - Balance Support: Bilateral upper extremity supported;During functional activity Dynamic Standing - Level of Assistance: 5: Stand by assistance Extremity Assessment  RUE Assessment RUE Assessment: Within Functional Limits LUE Assessment LUE Assessment: Within Functional Limits RLE Assessment RLE Assessment: Within Functional Limits General Strength Comments: Grossly 4+/5 LLE Assessment LLE Assessment:  Within Functional Limits General Strength Comments: Grossly 4+/5    Rosita DeChalus 06/24/2020, 10:18 AM

## 2020-06-24 NOTE — Progress Notes (Signed)
[facility] PHYSICAL MEDICINE & REHABILITATION PROGRESS NOTE   Subjective/Complaints: No complaints this morning. Sugars have been well controlled- discussed titrating down insulin. Excited for DC home tomorrow  ROS: Pt denies SOB, abd pain, CP, N/V/C/D  Objective:   No results found. Recent Labs    06/21/20 1313  WBC 9.0  HGB 8.9*  HCT 28.7*  PLT 163   Recent Labs    06/21/20 1313  NA 137  K 4.0  CL 98  CO2 25  GLUCOSE 107*  BUN 30*  CREATININE 9.19*  CALCIUM 8.8*    Intake/Output Summary (Last 24 hours) at 06/24/2020 1001 Last data filed at 06/24/2020 0851 Gross per 24 hour  Intake 684 ml  Output --  Net 684 ml     Physical Exam: Vital Signs Blood pressure (!) 103/51, pulse 62, temperature 98.2 F (36.8 C), resp. rate 20, height 6\' 2"  (1.88 m), weight 98.4 kg, SpO2 96 %. General: Alert and oriented x 3, No apparent distress HEENT: Head is normocephalic, atraumatic, PERRLA, EOMI, sclera anicteric, oral mucosa pink and moist, dentition intact, ext ear canals clear,  Neck: Supple without JVD or lymphadenopathy Heart: Reg rate and rhythm. No murmurs rubs or gallops Chest: CTA bilaterally without wheezes, rales, or rhonchi; no distress Abdomen: Soft, non-tender, non-distended, bowel sounds positive. Extremities: No clubbing, cyanosis, or edema. Pulses are 2+ Skin: Clean and intact without signs of breakdown Neurologic motor strength is5/5 in bilateral deltoid, bicep, tricep, grip, hip flexor, knee extensors, ankle dorsiflexor and plantar flexor Musculoskeletal: Full range of motion in all 4 extremities. No joint swelling Donning pants with supervision of therapy Psych: Pt's affect is appropriate. Pt is cooperative    Assessment/Plan: 1. Functional deficits secondary to debility renal cell carcinoma which require 3+ hours per day of interdisciplinary therapy in a comprehensive inpatient rehab setting.  Physiatrist is providing close team supervision and 24  hour management of active medical problems listed below.  Physiatrist and rehab team continue to assess barriers to discharge/monitor patient progress toward functional and medical goals  Care Tool:  Bathing    Body parts bathed by patient: Right arm, Left arm, Chest, Abdomen, Front perineal area, Face, Right upper leg, Left upper leg, Buttocks, Right lower leg, Left lower leg   Body parts bathed by helper: Buttocks, Right lower leg, Left lower leg Body parts n/a: Front perineal area, Buttocks, Right upper leg, Left upper leg, Right lower leg, Left lower leg   Bathing assist Assist Level: Supervision/Verbal cueing     Upper Body Dressing/Undressing Upper body dressing   What is the patient wearing?: Pull over shirt    Upper body assist Assist Level: Set up assist    Lower Body Dressing/Undressing Lower body dressing      What is the patient wearing?: Underwear/pull up, Pants     Lower body assist Assist for lower body dressing: Supervision/Verbal cueing     Toileting Toileting    Toileting assist Assist for toileting: Supervision/Verbal cueing (simulated)     Transfers Chair/bed transfer  Transfers assist     Chair/bed transfer assist level: Contact Guard/Touching assist Chair/bed transfer assistive device: Programmer, multimedia   Ambulation assist      Assist level: Contact Guard/Touching assist Assistive device: Walker-rolling Max distance: 40   Walk 10 feet activity   Assist     Assist level: Contact Guard/Touching assist Assistive device: Walker-rolling   Walk 50 feet activity   Assist Walk 50 feet with 2 turns activity did  not occur: Safety/medical concerns         Walk 150 feet activity   Assist Walk 150 feet activity did not occur: Safety/medical concerns         Walk 10 feet on uneven surface  activity   Assist Walk 10 feet on uneven surfaces activity did not occur: Safety/medical concerns          Wheelchair     Assist Will patient use wheelchair at discharge?: No   Wheelchair activity did not occur: Safety/medical concerns         Wheelchair 50 feet with 2 turns activity    Assist    Wheelchair 50 feet with 2 turns activity did not occur: Safety/medical concerns       Wheelchair 150 feet activity     Assist  Wheelchair 150 feet activity did not occur: Safety/medical concerns       Blood pressure (!) 103/51, pulse 62, temperature 98.2 F (36.8 C), resp. rate 20, height 6\' 2"  (1.88 m), weight 98.4 kg, SpO2 96 %.  Medical Problem List and Plan: 1.Functional and mobility deficitssecondary to debility after right nephrectomy and multiple post-op medical complications -patient may not shower until mid Sept due to new diatek cath  -ELOS/Goals: 5-7 days, mod I  Continue CIR 2. Antithrombotics: -DVT/anticoagulation:Mechanical:Sequential compression devices, below kneeBilateral lower extremities -antiplatelet therapy: N/A 3. Pain Management:Tylenol prn. Well controlled 4. Mood:LCSW to follow for evaluation and support. -antipsychotic agents: N/A 5. Neuropsych: This patientiscapable of making decisions onhisown behalf. 6. Skin/Wound Care:Monitor wound for healing. Protein supplements to promote healing. 7. Fluids/Electrolytes/Nutrition:Change to Carb modified/renal diet--not on fluid restriction at this time. Monitor strict I/O as well as daily weights.  9. Persistent Hypotension: -patient asymptomatic. 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 HD TTS--will schedule at the end of the day to help with tolerance of therapy. Appreciate nephrology note. 2.5L removed Saturday 11. ABLA:Serial labs with HD. On areanesp weekly and s/p iron repletion. Hgb improved to 8.9  on 8/14.  12. New onset A fib with RVR: Monitor HR tid--on low dose metoprolol bid.  Vitals:   06/24/20 0539 06/24/20 0910  BP: (!) 103/51   Pulse: 74 62  Resp: 16 20  Temp: 98.2 F (36.8 C)   SpO2: 100% 96%  BP soft- had HD Saturday. Discussed with wife and daughter to check BP at home daily and when symptomatic and record values and symptoms and bring in to f/u appointment.  13. H/o Asthma: Continue pulmicort, brovana and Xopenex nebs  8/9- appeared more uncomfortable- will increase xopenex to q4 hours prn and added O2 prn.   14. T2DM diet controlled: Hgb A1C- 6.2 and controlled. Will monitor BS ac/hs and use SSI for elevated BS--likely due to stress and steroids. .  CBG (last 3)  Recent Labs    06/23/20 1140 06/23/20 1635 06/23/20 2145  GLUCAP 101* 108* 105*  Decrease insulin to 4U- wean off as able 15.  Loose stools  better-daily and cont 16. Nausea: Improved with prn medication LOS: 14 days A FACE TO FACE EVALUATION WAS PERFORMED  Shonique Pelphrey P Travers Goodley 06/24/2020, 10:01 AM

## 2020-06-24 NOTE — Progress Notes (Signed)
  [facility] Progress Note   74 y.o.yo malewith advanced CKD at baseline who was admitted on 8/3/2021with need for nephrectomy  Assessment/ Plan:   1. RCC- Need for nephrectomy - done on 7/9- c/b bleeding and progression to ESRD 2. ESRD- new diagnosis this admit- HD initiated- On a TTS schedule- Has TDC and AVG placed 7/30. Will be [facility] on TTS schedule upon discharge.  Difficulties with hypotension on dialysis.  On midodrine and Cortef.  EDW is ~100.5 -HD today 3. Anemia-hgb in the 7's, last hgb 8.9- On Darbe 200 weekly(last given 8/7)and s/p iron repletion 4. Secondary hyperparathyroidism- PTH 116, calc WNL-continue PhosLo. Last phos ok 5.  Hypotension/volume-continue midodrine and Cortef.  Ultrafiltration as able 6. Malnutrition-On protein supplmentation 7. Dispo- CIR. eventually to home with OP HD [facility] TTS.  [facility] 06/24/2020, 10:03 AM   Subjective:   No acute events, no complaints. Eager to go home.   Objective:   BP (!) 103/51 (BP Location: Right Arm)   Pulse 62   Temp 98.2 F (36.8 C)   Resp 20   Ht 6\' 2"  (1.88 m) Comment: stated  Wt 98.4 kg   SpO2 96%   BMI 27.85 kg/m   Intake/Output Summary (Last 24 hours) at 06/24/2020 1003 Last data filed at 06/24/2020 0851 Gross per 24 hour  Intake 684 ml  Output --  Net 684 ml   Weight change: -2.8 kg  Physical Exam: General: obese, pleasant- Minimal mobility- NAD Heart: RRR Lungs: mostly clear Abdomen: obese, soft, non tender Extremities: min edema Dialysis Access: leftFALAVG+ bruit- right TDC  Imaging: No results found.  Labs: BMET Recent Labs  Lab 06/17/20 1344 06/19/20 1437 06/21/20 1313 06/22/20 0442  NA 134* 136 137  --   K 4.7 4.5 4.0  --   CL 95* 97* 98  --   CO2 23 26 25   --   GLUCOSE 124* 104* 107*  --   BUN 52* 37* 30*  --   CREATININE 11.22* 10.01* 9.19*  --   CALCIUM 8.4* 8.9 8.8*  --   PHOS  6.2* 5.1* 4.5 3.4   CBC Recent Labs  Lab 06/17/20 1344 06/19/20 1436 06/21/20 1313  WBC 14.5* 10.6* 9.0  HGB 8.3* 8.3* 8.9*  HCT 27.8* 27.9* 28.7*  MCV 97.9 99.6 100.0  PLT 197 165 163    Medications:    . arformoterol  15 mcg Nebulization BID  . atorvastatin  40 mg Oral Daily  . budesonide (PULMICORT) nebulizer solution  0.5 mg Nebulization BID  . calcium acetate  2,001 mg Oral TID WC  . Chlorhexidine Gluconate Cloth  6 each Topical Q0600  . darbepoetin (ARANESP) injection - DIALYSIS  200 mcg Intravenous Q Sat-HD  . fluticasone  2 spray Each Nare Daily  . hydrocortisone  10 mg Oral BID WC  . insulin aspart  0-5 Units Subcutaneous QHS  . insulin aspart  0-9 Units Subcutaneous TID WC  . insulin detemir  6 Units Subcutaneous QHS  . mouth rinse  15 mL Mouth Rinse q12n4p  . metoprolol tartrate  12.5 mg Oral BID  . midodrine  10 mg Oral TID WC  . multivitamin  1 tablet Oral QHS  . psyllium  1 packet Oral Daily

## 2020-06-24 NOTE — Plan of Care (Signed)
°  Problem: Consults Goal: RH GENERAL PATIENT EDUCATION Description: See Patient Education module for education specifics. Outcome: Progressing Goal: Skin Care Protocol Initiated - if Braden Score 18 or less Description: If consults are not indicated, leave blank or document N/A Outcome: Progressing Goal: Nutrition Consult-if indicated Outcome: Progressing Goal: Diabetes Guidelines if Diabetic/Glucose > 140 Description: If diabetic or lab glucose is > 140 mg/dl - Initiate Diabetes/Hyperglycemia Guidelines & Document Interventions  Outcome: Progressing   Problem: RH BOWEL ELIMINATION Goal: RH STG MANAGE BOWEL WITH ASSISTANCE Description: STG Manage Bowel with mod I Assistance. Outcome: Progressing Goal: RH STG MANAGE BOWEL W/MEDICATION W/ASSISTANCE Description: STG Manage Bowel with Medication with mod I Assistance. Outcome: Progressing   Problem: RH SKIN INTEGRITY Goal: RH STG SKIN FREE OF INFECTION/BREAKDOWN Description: Will stay free of infection or new pressure areas  Outcome: Progressing Goal: RH STG MAINTAIN SKIN INTEGRITY WITH ASSISTANCE Description: STG Maintain Skin Integrity With min Assistance. Outcome: Progressing Goal: RH STG ABLE TO PERFORM INCISION/WOUND CARE W/ASSISTANCE Description: STG Able To Perform Incision/Wound Care With min Assistance. Outcome: Progressing   Problem: RH SAFETY Goal: RH STG ADHERE TO SAFETY PRECAUTIONS W/ASSISTANCE/DEVICE Description: STG Adhere to Safety Precautions With cues/reminders Assistance/Device. Outcome: Progressing   Problem: RH PAIN MANAGEMENT Goal: RH STG PAIN MANAGED AT OR BELOW PT'S PAIN GOAL Description: Pain scale <3/10 Outcome: Progressing   Problem: RH KNOWLEDGE DEFICIT GENERAL Goal: RH STG INCREASE KNOWLEDGE OF SELF CARE AFTER HOSPITALIZATION Description: Patient will have knowledge of illnesses & precautions to take against exacerbation with cues/reminders Outcome: Progressing

## 2020-06-24 NOTE — Progress Notes (Signed)
Physical Therapy Session Note  Patient Details  Name: Todd Robertson MRN: [id number] Date of Birth: 1946/01/25  Today's Date: 06/24/2020 PT Individual Time: 0915-1009 PT Individual Time Calculation (min): 54 min   Short Term Goals: Week 1:  PT Short Term Goal 1 (Week 1): None due to ELOS  Skilled Therapeutic Interventions/Progress Updates: Pt presented in w/c agreeable to therapy. Pt denies pain throughout session although heavy use of accessory ms for breathing throughout session. Pt transported to rehab gym and participated in ambulation, ambulation on compliant surface, ambulation to 5in curb to simulate home entry. Pt unable to complete additional activities due to fatigue and bouts of "lightheadedness" vitals checked 103/74 (80) HR 103. Pt required a significant amount of time to perform each activity as pt stated needed to "catch his breath" SpO2 remained >90% throughout. Pt returned to room and performed ambulatory transfer back to bed mod I. Pt left in bed with bed alarm on, call bell within reach and needs met.      Therapy Documentation Precautions:  Precautions Precautions: Fall Restrictions Weight Bearing Restrictions: No General:   Vital Signs: Therapy Vitals Temp: 98.9 F (37.2 C) Temp Source: Oral Pulse Rate: 88 Resp: 17 BP: 113/71 Patient Position (if appropriate): Lying Oxygen Therapy SpO2: 94 % O2 Device: Room Air   Therapy/Group: Individual Therapy  Glennon Kopko  Montina Dorrance, PTA  06/24/2020, 4:17 PM

## 2020-06-24 NOTE — Progress Notes (Signed)
Patient ID: Todd Robertson, male   DOB: 05-31-1946, 75 y.o.   MRN: [id number]   Sw received notification spouse cancelled pt TTB order, will follow up.

## 2020-06-24 NOTE — Progress Notes (Signed)
Patient's OP HD clinic/[facility] has notified Navigator of their new practice where all new patients must have a COVID test before starting. Navigator has requested a COVID test be ordered by primary team and will fax results to clinic.  Alphonzo Cruise, [facility] [phone]

## 2020-06-25 LAB — GLUCOSE, CAPILLARY: Glucose-Capillary: 140 mg/dL — ABNORMAL HIGH (ref 70–99)

## 2020-06-25 LAB — SARS CORONAVIRUS 2 (TAT 6-24 HRS): SARS Coronavirus 2: NEGATIVE

## 2020-06-25 NOTE — Discharge Instructions (Signed)
Inpatient Rehab Discharge Instructions  [name] Discharge date and time: No discharge date for patient encounter.   Activities/Precautions/ Functional Status: Activity: As tolerated Diet: Renal diabetic diet with 1200 mL fluid restriction Wound Care: Routine skin checks Functional status:  ___ No restrictions     ___ Walk up steps independently ___ 24/7 supervision/assistance   ___ Walk up steps with assistance ___ Intermittent supervision/assistance  ___ Bathe/dress independently ___ Walk with walker     ___ Bathe/dress with assistance ___ Walk Independently    ___ Shower independently ___ Walk with assistance    ___ Shower with assistance ___ No alcohol     ___ Return to work/school ________ COMMUNITY REFERRALS UPON DISCHARGE:    Home Health:   PT     OT     ST    Aide                Agency: [facility] Phone: [phone]   Medical Equipment/Items Ordered: Tub Producer, television/film/video, Wheelchair                                                 Agency/Supplier: [employer]  Special Instructions: No driving smoking or alcohol  Continue hemodialysis as directed   My questions have been answered and I understand these instructions. I will adhere to these goals and the provided educational materials after my discharge from the hospital.  Patient/Caregiver Signature _______________________________ Date __________  Clinician Signature _______________________________________ Date __________  Please bring this form and your medication list with you to all your follow-up doctor's appointments.

## 2020-06-25 NOTE — Progress Notes (Signed)
Todd Robertson PHYSICAL MEDICINE & REHABILITATION PROGRESS NOTE   Subjective/Complaints: Aware of discharge plans today.  ROS: Pt denies SOB, abd pain, CP, N/V/C/D  Objective:   No results found. Recent Labs    06/24/20 1230  WBC 13.5*  HGB 9.7*  HCT 32.2*  PLT 183   Recent Labs    06/24/20 1304  NA 135  K 4.5  CL 92*  CO2 25  GLUCOSE 95  BUN 48*  CREATININE 11.00*  CALCIUM 9.5    Intake/Output Summary (Last 24 hours) at 06/25/2020 2956 Last data filed at 06/25/2020 0813 Gross per 24 hour  Intake 300 ml  Output 1000 ml  Net -700 ml     Physical Exam: Vital Signs Blood pressure (!) 128/97, pulse 89, temperature 98.5 F (36.9 C), resp. rate 17, height 6\' 2"  (1.88 m), weight 99.3 kg, SpO2 98 %.  General: No acute distress Mood and affect are appropriate Heart: Regular rate and rhythm no rubs murmurs or extra sounds Lungs: Clear to auscultation, breathing unlabored, no rales or wheezes Abdomen: Positive bowel sounds, soft nontender to palpation, nondistended Extremities: No clubbing, cyanosis, or edema Skin: No evidence of breakdown, no evidence of rash Neurologic: Cranial nerves II through XII intact, motor strength is 4/5 in bilateral deltoid, bicep, tricep, grip, hip flexor, knee extensors, ankle dorsiflexor and plantar flexor Sensory exam normal sensation to light touch and proprioception in bilateral upper and lower extremities Cerebellar exam normal finger to nose to finger as well as heel to shin in bilateral upper and lower extremities Musculoskeletal: Full range of motion in all 4 extremities. No joint swelling     Assessment/Plan: 1. Functional deficits secondary to debility renal cell carcinoma  Stable for D/C today F/u PCP in 3-4 weeks F/u PM&R 2 weeks See D/C summary See D/C instructions Care Tool:  Bathing    Body parts bathed by patient: Right arm, Left arm, Chest, Abdomen, Front perineal area, Face, Right upper leg, Left upper leg,  Buttocks, Right lower leg, Left lower leg   Body parts bathed by helper: Buttocks, Right lower leg, Left lower leg Body parts n/a: Front perineal area, Buttocks, Right upper leg, Left upper leg, Right lower leg, Left lower leg   Bathing assist Assist Level: Supervision/Verbal cueing     Upper Body Dressing/Undressing Upper body dressing   What is the patient wearing?: Pull over shirt    Upper body assist Assist Level: Set up assist    Lower Body Dressing/Undressing Lower body dressing      What is the patient wearing?: Underwear/pull up, Pants     Lower body assist Assist for lower body dressing: Supervision/Verbal cueing     Toileting Toileting    Toileting assist Assist for toileting: Supervision/Verbal cueing     Transfers Chair/bed transfer  Transfers assist     Chair/bed transfer assist level: Contact Guard/Touching assist Chair/bed transfer assistive device: Programmer, multimedia   Ambulation assist      Assist level: Contact Guard/Touching assist Assistive device: Walker-rolling Max distance: 40   Walk 10 feet activity   Assist     Assist level: Contact Guard/Touching assist Assistive device: Walker-rolling   Walk 50 feet activity   Assist Walk 50 feet with 2 turns activity did not occur: Safety/medical concerns         Walk 150 feet activity   Assist Walk 150 feet activity did not occur: Safety/medical concerns         Walk 10 feet on  uneven surface  activity   Assist Walk 10 feet on uneven surfaces activity did not occur: Safety/medical concerns         Wheelchair     Assist Will patient use wheelchair at discharge?: No   Wheelchair activity did not occur: Safety/medical concerns         Wheelchair 50 feet with 2 turns activity    Assist    Wheelchair 50 feet with 2 turns activity did not occur: Safety/medical concerns       Wheelchair 150 feet activity     Assist  Wheelchair 150  feet activity did not occur: Safety/medical concerns       Blood pressure (!) 128/97, pulse 89, temperature 98.5 F (36.9 C), resp. rate 17, height 6\' 2"  (1.88 m), weight 99.3 kg, SpO2 98 %.  Medical Problem List and Plan: 1.Functional and mobility deficitssecondary to debility after right nephrectomy and multiple post-op medical complications -patient may not shower until mid Sept due to new diatek cath  -ELOS/Goals: 5-7 days, mod I Discharge home today 2. Antithrombotics: -DVT/anticoagulation:Mechanical:Sequential compression devices, below kneeBilateral lower extremities -antiplatelet therapy: N/A 3. Pain Management:Tylenol prn. Well controlled 4. Mood:LCSW to follow for evaluation and support. -antipsychotic agents: N/A 5. Neuropsych: This patientiscapable of making decisions onhisown behalf. 6. Skin/Wound Care:Monitor wound for healing. Protein supplements to promote healing. 7. Fluids/Electrolytes/Nutrition:Change to Carb modified/renal diet--not on fluid restriction at this time. Monitor strict I/O as well as daily weights.  9. Persistent Hypotension: -patient asymptomatic. 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 HD TTS--will schedule at the end of the day to help with tolerance of therapy. Appreciate nephrology note.  Covid negative results sent to outpatient dialysis center 11. ABLA:Serial labs with HD. On areanesp weekly and s/p iron repletion. Hgb improved to 8.9 on 8/14.  12. New onset A fib with RVR: Monitor HR tid--on low dose metoprolol bid.  Vitals:   06/25/20 0545 06/25/20 0820  BP: (!) 128/97   Pulse: 89   Resp:    Temp: 98.5 F (36.9 C)   SpO2: 99% 98%   13. H/o Asthma: Continue pulmicort, brovana and Xopenex nebs  8/9- appeared more uncomfortable- will  increase xopenex to q4 hours prn and added O2 prn.   14. T2DM diet controlled: Hgb A1C- 6.2 and controlled. Will monitor BS ac/hs and use SSI for elevated BS--likely due to stress and steroids. .  CBG (last 3)  Recent Labs    06/24/20 1744 06/24/20 2055 06/25/20 0613  GLUCAP 86 81 140*  On sliding scale only 15.  Loose stools  better-daily and cont 16. Nausea: Improved with prn medication LOS: 15 days A FACE TO [place] E Jayli Todd Robertson 06/25/2020, 9:37 AM

## 2020-06-25 NOTE — Progress Notes (Signed)
Pt discharge done by PA. Pt assisted to pack belongings. Rolled down to vehicle in wheelchair by staff.

## 2020-06-25 NOTE — Plan of Care (Signed)
  Problem: Consults Goal: RH GENERAL PATIENT EDUCATION Description: See Patient Education module for education specifics. Outcome: Completed/Met Goal: Skin Care Protocol Initiated - if Braden Score 18 or less Description: If consults are not indicated, leave blank or document N/A Outcome: Completed/Met Goal: Nutrition Consult-if indicated Outcome: Completed/Met Goal: Diabetes Guidelines if Diabetic/Glucose > 140 Description: If diabetic or lab glucose is > 140 mg/dl - Initiate Diabetes/Hyperglycemia Guidelines & Document Interventions  Outcome: Completed/Met   Problem: Consults Goal: RH GENERAL PATIENT EDUCATION Description: See Patient Education module for education specifics. Outcome: Completed/Met Goal: Skin Care Protocol Initiated - if Braden Score 18 or less Description: If consults are not indicated, leave blank or document N/A Outcome: Completed/Met Goal: Nutrition Consult-if indicated Outcome: Completed/Met Goal: Diabetes Guidelines if Diabetic/Glucose > 140 Description: If diabetic or lab glucose is > 140 mg/dl - Initiate Diabetes/Hyperglycemia Guidelines & Document Interventions  Outcome: Completed/Met   Problem: RH BOWEL ELIMINATION Goal: RH STG MANAGE BOWEL WITH ASSISTANCE Description: STG Manage Bowel with mod I Assistance. Outcome: Completed/Met Goal: RH STG MANAGE BOWEL W/MEDICATION W/ASSISTANCE Description: STG Manage Bowel with Medication with mod I Assistance. Outcome: Completed/Met   Problem: RH SKIN INTEGRITY Goal: RH STG SKIN FREE OF INFECTION/BREAKDOWN Description: Will stay free of infection or new pressure areas  Outcome: Completed/Met Goal: RH STG MAINTAIN SKIN INTEGRITY WITH ASSISTANCE Description: STG Maintain Skin Integrity With min Assistance. Outcome: Completed/Met Goal: RH STG ABLE TO PERFORM INCISION/WOUND CARE W/ASSISTANCE Description: STG Able To Perform Incision/Wound Care With min Assistance. Outcome: Completed/Met   Problem: RH  SAFETY Goal: RH STG ADHERE TO SAFETY PRECAUTIONS W/ASSISTANCE/DEVICE Description: STG Adhere to Safety Precautions With cues/reminders Assistance/Device. Outcome: Completed/Met   Problem: RH PAIN MANAGEMENT Goal: RH STG PAIN MANAGED AT OR BELOW PT'S PAIN GOAL Description: Pain scale <3/10 Outcome: Completed/Met   Problem: RH KNOWLEDGE DEFICIT GENERAL Goal: RH STG INCREASE KNOWLEDGE OF SELF CARE AFTER HOSPITALIZATION Description: Patient will have knowledge of illnesses & precautions to take against exacerbation with cues/reminders Outcome: Completed/Met

## 2020-06-25 NOTE — Progress Notes (Signed)
Renal Navigator faxed negative COVID test result to OP HD clinic/[facility]. Patient to start at OP HD clinic tomorrow, Thursday, 06/26/20 at 8:30am.  Renal Navigator contacted patient's wife to confirm plan. She agreed.  Alphonzo Cruise, [place] Renal Navigator [phone]

## 2020-06-25 NOTE — Progress Notes (Signed)
Inpatient Rehabilitation Care Coordinator  Discharge Note  The overall goal for the admission was met for:   Discharge location: Yes  Length of Stay: Yes  Discharge activity level: Yes  Home/community participation: Yes  Services provided included: MD, RD, PT, OT, SLP, RN, CM, TR, Pharmacy and SW  Financial Services: Medicare  Follow-up services arranged: Home Health: [facility]  Comments (or additional information):  Patient/Family verbalized understanding of follow-up arrangements: Yes  Individual responsible for coordination of the follow-up plan: [phone]  Confirmed correct DME delivered: Dyanne Iha 06/25/2020    Dyanne Iha

## 2020-06-26 DIAGNOSIS — N186 End stage renal disease: Secondary | ICD-10-CM | POA: Diagnosis not present

## 2020-06-26 DIAGNOSIS — E1122 Type 2 diabetes mellitus with diabetic chronic kidney disease: Secondary | ICD-10-CM | POA: Diagnosis not present

## 2020-06-26 DIAGNOSIS — D631 Anemia in chronic kidney disease: Secondary | ICD-10-CM | POA: Diagnosis not present

## 2020-06-26 DIAGNOSIS — I10 Essential (primary) hypertension: Secondary | ICD-10-CM | POA: Diagnosis not present

## 2020-06-26 DIAGNOSIS — N2581 Secondary hyperparathyroidism of renal origin: Secondary | ICD-10-CM | POA: Diagnosis not present

## 2020-06-26 DIAGNOSIS — Z794 Long term (current) use of insulin: Secondary | ICD-10-CM | POA: Diagnosis not present

## 2020-06-26 DIAGNOSIS — Z992 Dependence on renal dialysis: Secondary | ICD-10-CM | POA: Diagnosis not present

## 2020-06-26 DIAGNOSIS — D509 Iron deficiency anemia, unspecified: Secondary | ICD-10-CM | POA: Diagnosis not present

## 2020-06-26 DIAGNOSIS — E119 Type 2 diabetes mellitus without complications: Secondary | ICD-10-CM | POA: Diagnosis not present

## 2020-06-26 DIAGNOSIS — C641 Malignant neoplasm of right kidney, except renal pelvis: Secondary | ICD-10-CM | POA: Diagnosis not present

## 2020-06-26 DIAGNOSIS — Z299 Encounter for prophylactic measures, unspecified: Secondary | ICD-10-CM | POA: Diagnosis not present

## 2020-06-27 DIAGNOSIS — Z7951 Long term (current) use of inhaled steroids: Secondary | ICD-10-CM | POA: Diagnosis not present

## 2020-06-27 DIAGNOSIS — M150 Primary generalized (osteo)arthritis: Secondary | ICD-10-CM | POA: Diagnosis not present

## 2020-06-27 DIAGNOSIS — I132 Hypertensive heart and chronic kidney disease with heart failure and with stage 5 chronic kidney disease, or end stage renal disease: Secondary | ICD-10-CM | POA: Diagnosis not present

## 2020-06-27 DIAGNOSIS — I9589 Other hypotension: Secondary | ICD-10-CM | POA: Diagnosis not present

## 2020-06-27 DIAGNOSIS — Z905 Acquired absence of kidney: Secondary | ICD-10-CM | POA: Diagnosis not present

## 2020-06-27 DIAGNOSIS — Z8546 Personal history of malignant neoplasm of prostate: Secondary | ICD-10-CM | POA: Diagnosis not present

## 2020-06-27 DIAGNOSIS — I2589 Other forms of chronic ischemic heart disease: Secondary | ICD-10-CM | POA: Diagnosis not present

## 2020-06-27 DIAGNOSIS — D631 Anemia in chronic kidney disease: Secondary | ICD-10-CM | POA: Diagnosis not present

## 2020-06-27 DIAGNOSIS — N99840 Postprocedural hematoma of a genitourinary system organ or structure following a genitourinary system procedure: Secondary | ICD-10-CM | POA: Diagnosis not present

## 2020-06-27 DIAGNOSIS — Z87891 Personal history of nicotine dependence: Secondary | ICD-10-CM | POA: Diagnosis not present

## 2020-06-27 DIAGNOSIS — N186 End stage renal disease: Secondary | ICD-10-CM | POA: Diagnosis not present

## 2020-06-27 DIAGNOSIS — Z992 Dependence on renal dialysis: Secondary | ICD-10-CM | POA: Diagnosis not present

## 2020-06-27 DIAGNOSIS — I4891 Unspecified atrial fibrillation: Secondary | ICD-10-CM | POA: Diagnosis not present

## 2020-06-27 DIAGNOSIS — J45909 Unspecified asthma, uncomplicated: Secondary | ICD-10-CM | POA: Diagnosis not present

## 2020-06-27 DIAGNOSIS — E1122 Type 2 diabetes mellitus with diabetic chronic kidney disease: Secondary | ICD-10-CM | POA: Diagnosis not present

## 2020-06-27 DIAGNOSIS — D62 Acute posthemorrhagic anemia: Secondary | ICD-10-CM | POA: Diagnosis not present

## 2020-06-27 DIAGNOSIS — M1030 Gout due to renal impairment, unspecified site: Secondary | ICD-10-CM | POA: Diagnosis not present

## 2020-06-27 DIAGNOSIS — J690 Pneumonitis due to inhalation of food and vomit: Secondary | ICD-10-CM | POA: Diagnosis not present

## 2020-06-27 DIAGNOSIS — C641 Malignant neoplasm of right kidney, except renal pelvis: Secondary | ICD-10-CM | POA: Diagnosis not present

## 2020-06-27 DIAGNOSIS — E46 Unspecified protein-calorie malnutrition: Secondary | ICD-10-CM | POA: Diagnosis not present

## 2020-06-27 DIAGNOSIS — Z794 Long term (current) use of insulin: Secondary | ICD-10-CM | POA: Diagnosis not present

## 2020-06-27 DIAGNOSIS — D630 Anemia in neoplastic disease: Secondary | ICD-10-CM | POA: Diagnosis not present

## 2020-06-27 DIAGNOSIS — I509 Heart failure, unspecified: Secondary | ICD-10-CM | POA: Diagnosis not present

## 2020-06-27 DIAGNOSIS — E7800 Pure hypercholesterolemia, unspecified: Secondary | ICD-10-CM | POA: Diagnosis not present

## 2020-06-28 DIAGNOSIS — D509 Iron deficiency anemia, unspecified: Secondary | ICD-10-CM | POA: Diagnosis not present

## 2020-06-28 DIAGNOSIS — D631 Anemia in chronic kidney disease: Secondary | ICD-10-CM | POA: Diagnosis not present

## 2020-06-28 DIAGNOSIS — N186 End stage renal disease: Secondary | ICD-10-CM | POA: Diagnosis not present

## 2020-06-28 DIAGNOSIS — N2581 Secondary hyperparathyroidism of renal origin: Secondary | ICD-10-CM | POA: Diagnosis not present

## 2020-06-28 DIAGNOSIS — Z992 Dependence on renal dialysis: Secondary | ICD-10-CM | POA: Diagnosis not present

## 2020-06-30 DIAGNOSIS — D638 Anemia in other chronic diseases classified elsewhere: Secondary | ICD-10-CM

## 2020-06-30 DIAGNOSIS — I132 Hypertensive heart and chronic kidney disease with heart failure and with stage 5 chronic kidney disease, or end stage renal disease: Secondary | ICD-10-CM | POA: Diagnosis not present

## 2020-06-30 DIAGNOSIS — I9589 Other hypotension: Secondary | ICD-10-CM | POA: Diagnosis not present

## 2020-06-30 DIAGNOSIS — I959 Hypotension, unspecified: Secondary | ICD-10-CM

## 2020-06-30 DIAGNOSIS — C641 Malignant neoplasm of right kidney, except renal pelvis: Secondary | ICD-10-CM | POA: Diagnosis not present

## 2020-06-30 DIAGNOSIS — N186 End stage renal disease: Secondary | ICD-10-CM

## 2020-06-30 DIAGNOSIS — Z992 Dependence on renal dialysis: Secondary | ICD-10-CM

## 2020-06-30 DIAGNOSIS — I2589 Other forms of chronic ischemic heart disease: Secondary | ICD-10-CM | POA: Diagnosis not present

## 2020-06-30 DIAGNOSIS — N99840 Postprocedural hematoma of a genitourinary system organ or structure following a genitourinary system procedure: Secondary | ICD-10-CM | POA: Diagnosis not present

## 2020-06-30 DIAGNOSIS — D630 Anemia in neoplastic disease: Secondary | ICD-10-CM | POA: Diagnosis not present

## 2020-06-30 NOTE — Discharge Summary (Signed)
Physician Discharge Summary  Patient ID: Todd Robertson MRN: [id number] DOB/AGE: 07-29-1946 74 y.o.  Admit date: 06/10/2020 Discharge date: 06/25/2020  Discharge Diagnoses:  Principal Problem:   Physical debility Active Problems:   T2DM (type 2 diabetes mellitus) ([place])   Cancer of kidney ([facility])   Pressure injury of skin   Hypotension   ESRD on dialysis ([place])   Anemia, chronic disease   Discharged Condition: stable.  Significant Diagnostic Studies: DG Abd 1 View  Result Date: 06/14/2020 CLINICAL DATA:  Abdominal distention. EXAM: ABDOMEN - 1 VIEW COMPARISON:  05/30/2020 FINDINGS: Nasogastric tube has been removed. Bowel gas pattern is nonobstructed. No evidence for free intraperitoneal air or organomegaly. IMPRESSION: Nonobstructive bowel gas pattern. Electronically Signed   By: Nolon Nations M.D.   On: 06/14/2020 10:14    Labs:  Basic Metabolic Panel: BMP Latest Ref Rng & Units 06/24/2020 06/21/2020 06/19/2020  Glucose 70 - 99 mg/dL 95 107(H) 104(H)  BUN 8 - 23 mg/dL 48(H) 30(H) 37(H)  Creatinine 0.61 - 1.24 mg/dL 11.00(H) 9.19(H) 10.01(H)  Sodium 135 - 145 mmol/L 135 137 136  Potassium 3.5 - 5.1 mmol/L 4.5 4.0 4.5  Chloride 98 - 111 mmol/L 92(L) 98 97(L)  CO2 22 - 32 mmol/L 25 25 26   Calcium 8.9 - 10.3 mg/dL 9.5 8.8(L) 8.9    CBC: CBC Latest Ref Rng & Units 06/24/2020 06/21/2020 06/19/2020  WBC 4.0 - 10.5 K/uL 13.5(H) 9.0 10.6(H)  Hemoglobin 13.0 - 17.0 g/dL 9.7(L) 8.9(L) 8.3(L)  Hematocrit 39 - 52 % 32.2(L) 28.7(L) 27.9(L)  Platelets 150 - 400 K/uL 183 163 165    CBG: Recent Labs  Lab 06/24/20 0605 06/24/20 1143 06/24/20 1744 06/24/20 2055 06/25/20 0613  GLUCAP 94 115* 86 81 140*    Brief HPI:   Todd Robertson is a 74 y.o. 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  He was admitted on 05/16/1990 for right nephrectomy with lysis of adhesion by Dr. Alyson Ingles.  Postop course significant for issues with acute on chronic renal  failure, acute blood loss anemia due to large volume hemorrhage into the right nephrectomy bed as well as issues with volume overload and metabolic encephalopathy due to uremia.  He developed septic shock and was treated with broad-spectrum antibiotics for HCAP.  He has had complicated hospital course with recurrent fevers issues with persistent hypotension as well as A. fib with RVR.  He required CRRT and has been transitioned to hemodialysis with left forearm graft placed on 07/31 by Dr. Donnetta Hutching.  Midodrine on board to help manage hypotension and stress dose steroids were being weaned off.  Therapy was ongoing and CIR was recommended due to debility.     Hospital Course: ALDAHIR LITAKER was admitted to rehab 06/10/2020 for inpatient therapies to consist of PT and OT at least three hours five days a week. Past admission physiatrist, therapy team and rehab RN have worked together to provide customized collaborative inpatient rehab.  SCDs were used for DVT prophylaxis as patient at high risk for recurrent bleed.  Carb modified restriction added to diet and blood sugars were monitored on ac/hs basis. He was maintained on Aranesp weekly for acute on chronic anemia and follow-up CBC showed hemoglobin improved to 9.7. Heart rate has been controlled on low-dose metoprolol.   Blood pressures were monitored on TID basis and remain borderline low on midrodine tid as well as steroids bid.  Hemodialysis has been ongoing on TTS at the end of  the day to help with tolerance of therapy.  He has had difficulty with hypotension during hemodialysis.  EDW is approximately 100.5.  Diabetes has been monitored with ac/hs CBG checks and SSI was use prn for tighter BS control.  With increasing mobility blood sugars showed good control and Lantus was weaned off.  Respiratory status is stable but his endurance level continues to be an issue with episodic hypotension limiting overall activity.  Supervision is recommended at discharge.  He  will continue to receive follow-up therapy by [place].   Rehab course: During patient's stay in rehab weekly team conferences were held to monitor patient's progress, set goals and discuss barriers to discharge. At admission, patient required min assist with mobility and min to mod assist with ADL tasks. He  has had improvement in activity tolerance, balance, postural control as well as ability to compensate for deficits.  He is able to complete ADL tasks with supervision. He is independent for transfers and is able to ambulate 20' with use of RW and supervision.   Discharge disposition: 01-Home or Self Care  Diet: Renal/ Carb Modified.   Special Instructions: 1. No driving or strenuous activity.   Allergies as of 06/25/2020   No Known Allergies     Medication List    STOP taking these medications   allopurinol 300 MG tablet Commonly known as: ZYLOPRIM   aspirin EC 81 MG tablet   benazepril 20 MG tablet Commonly known as: LOTENSIN   bumetanide 1 MG tablet Commonly known as: BUMEX   carvedilol 25 MG tablet Commonly known as: COREG   diclofenac Sodium 1 % Gel Commonly known as: VOLTAREN   multivitamin tablet   tiZANidine 4 MG tablet Commonly known as: ZANAFLEX   VISINE OP     TAKE these medications   acetaminophen 325 MG tablet Commonly known as: TYLENOL Take 1-2 tablets (325-650 mg total) by mouth every 4 (four) hours as needed for mild pain. What changed:   medication strength  how much to take  when to take this  reasons to take this   albuterol 108 (90 Base) MCG/ACT inhaler Commonly known as: VENTOLIN HFA Inhale 1-2 puffs into the lungs every 6 (six) hours as needed for wheezing or shortness of breath.   atorvastatin 40 MG tablet Commonly known as: LIPITOR Take 1 tablet (40 mg total) by mouth daily. What changed: when to take this   budesonide-formoterol 160-4.5 MCG/ACT inhaler Commonly known as: SYMBICORT Inhale 2 puffs into the  lungs 2 (two) times daily. What changed: when to take this   calcium acetate 667 MG capsule Commonly known as: PHOSLO Take 3 capsules (2,001 mg total) by mouth 3 (three) times daily with meals.   hydrocortisone 10 MG tablet Commonly known as: CORTEF Take 1 tablet (10 mg total) by mouth 2 (two) times daily with a meal.   loratadine 10 MG tablet Commonly known as: CLARITIN Take 10 mg by mouth daily.   metoprolol tartrate 25 MG tablet Commonly known as: LOPRESSOR Take 0.5 tablets (12.5 mg total) by mouth 2 (two) times daily.   midodrine 10 MG tablet Commonly known as: PROAMATINE Take 1 tablet (10 mg total) by mouth 3 (three) times daily with meals.   multivitamin Tabs tablet Take 1 tablet by mouth at bedtime.   senna 8.6 MG tablet Commonly known as: SENOKOT Take 1 tablet by mouth daily as needed for constipation.   Vitamin D 50 MCG (2000 UT) tablet Take 1 tablet (2,000 Units  total) by mouth daily.       Follow-up Information    Kirsteins, Luanna Salk, MD Follow up.   Specialty: Physical Medicine and Rehabilitation Why: Only as needed Contact information: [place] [address] [phone]        Cleon Gustin, MD Follow up.   Specialty: Urology Why: Call for appointment Contact information: [place] [address] [phone]        Arnoldo Lenis, MD Follow up.   Specialty: Cardiology Why: Call for appointment as needed Contact information: [address] [place] [address] [phone]        Roney Jaffe, MD Follow up.   Specialty: Nephrology Why: Call for appointment Contact information: [place] [address] [phone]               Signed: Bary Leriche 06/30/2020, 5:45 PM

## 2020-07-01 ENCOUNTER — Telehealth: Payer: Self-pay

## 2020-07-01 DIAGNOSIS — D509 Iron deficiency anemia, unspecified: Secondary | ICD-10-CM | POA: Diagnosis not present

## 2020-07-01 DIAGNOSIS — N186 End stage renal disease: Secondary | ICD-10-CM | POA: Diagnosis not present

## 2020-07-01 DIAGNOSIS — N2581 Secondary hyperparathyroidism of renal origin: Secondary | ICD-10-CM | POA: Diagnosis not present

## 2020-07-01 DIAGNOSIS — Z992 Dependence on renal dialysis: Secondary | ICD-10-CM | POA: Diagnosis not present

## 2020-07-01 DIAGNOSIS — D631 Anemia in chronic kidney disease: Secondary | ICD-10-CM | POA: Diagnosis not present

## 2020-07-01 NOTE — Telephone Encounter (Signed)
Ramiro Harvest, PA from HD center called to ask if pt needed to be seen prior to cannulating pt's L forearm AVG placed on 06/06/20. He has been having outpatient HD. Per PA he does not need to be seen first. I have let Juanda Crumble know.

## 2020-07-02 DIAGNOSIS — D630 Anemia in neoplastic disease: Secondary | ICD-10-CM | POA: Diagnosis not present

## 2020-07-02 DIAGNOSIS — N99840 Postprocedural hematoma of a genitourinary system organ or structure following a genitourinary system procedure: Secondary | ICD-10-CM | POA: Diagnosis not present

## 2020-07-02 DIAGNOSIS — I9589 Other hypotension: Secondary | ICD-10-CM | POA: Diagnosis not present

## 2020-07-02 DIAGNOSIS — I132 Hypertensive heart and chronic kidney disease with heart failure and with stage 5 chronic kidney disease, or end stage renal disease: Secondary | ICD-10-CM | POA: Diagnosis not present

## 2020-07-02 DIAGNOSIS — I2589 Other forms of chronic ischemic heart disease: Secondary | ICD-10-CM | POA: Diagnosis not present

## 2020-07-02 DIAGNOSIS — C641 Malignant neoplasm of right kidney, except renal pelvis: Secondary | ICD-10-CM | POA: Diagnosis not present

## 2020-07-03 DIAGNOSIS — D509 Iron deficiency anemia, unspecified: Secondary | ICD-10-CM | POA: Diagnosis not present

## 2020-07-03 DIAGNOSIS — N186 End stage renal disease: Secondary | ICD-10-CM | POA: Diagnosis not present

## 2020-07-03 DIAGNOSIS — D631 Anemia in chronic kidney disease: Secondary | ICD-10-CM | POA: Diagnosis not present

## 2020-07-03 DIAGNOSIS — Z992 Dependence on renal dialysis: Secondary | ICD-10-CM | POA: Diagnosis not present

## 2020-07-03 DIAGNOSIS — N2581 Secondary hyperparathyroidism of renal origin: Secondary | ICD-10-CM | POA: Diagnosis not present

## 2020-07-04 DIAGNOSIS — I2589 Other forms of chronic ischemic heart disease: Secondary | ICD-10-CM | POA: Diagnosis not present

## 2020-07-04 DIAGNOSIS — I9589 Other hypotension: Secondary | ICD-10-CM | POA: Diagnosis not present

## 2020-07-04 DIAGNOSIS — C641 Malignant neoplasm of right kidney, except renal pelvis: Secondary | ICD-10-CM | POA: Diagnosis not present

## 2020-07-04 DIAGNOSIS — D630 Anemia in neoplastic disease: Secondary | ICD-10-CM | POA: Diagnosis not present

## 2020-07-04 DIAGNOSIS — N99840 Postprocedural hematoma of a genitourinary system organ or structure following a genitourinary system procedure: Secondary | ICD-10-CM | POA: Diagnosis not present

## 2020-07-04 DIAGNOSIS — I132 Hypertensive heart and chronic kidney disease with heart failure and with stage 5 chronic kidney disease, or end stage renal disease: Secondary | ICD-10-CM | POA: Diagnosis not present

## 2020-07-05 DIAGNOSIS — N186 End stage renal disease: Secondary | ICD-10-CM | POA: Diagnosis not present

## 2020-07-05 DIAGNOSIS — N2581 Secondary hyperparathyroidism of renal origin: Secondary | ICD-10-CM | POA: Diagnosis not present

## 2020-07-05 DIAGNOSIS — D509 Iron deficiency anemia, unspecified: Secondary | ICD-10-CM | POA: Diagnosis not present

## 2020-07-05 DIAGNOSIS — D631 Anemia in chronic kidney disease: Secondary | ICD-10-CM | POA: Diagnosis not present

## 2020-07-05 DIAGNOSIS — Z992 Dependence on renal dialysis: Secondary | ICD-10-CM | POA: Diagnosis not present

## 2020-07-07 DIAGNOSIS — I132 Hypertensive heart and chronic kidney disease with heart failure and with stage 5 chronic kidney disease, or end stage renal disease: Secondary | ICD-10-CM | POA: Diagnosis not present

## 2020-07-07 DIAGNOSIS — I2589 Other forms of chronic ischemic heart disease: Secondary | ICD-10-CM | POA: Diagnosis not present

## 2020-07-07 DIAGNOSIS — C641 Malignant neoplasm of right kidney, except renal pelvis: Secondary | ICD-10-CM | POA: Diagnosis not present

## 2020-07-07 DIAGNOSIS — D630 Anemia in neoplastic disease: Secondary | ICD-10-CM | POA: Diagnosis not present

## 2020-07-07 DIAGNOSIS — I9589 Other hypotension: Secondary | ICD-10-CM | POA: Diagnosis not present

## 2020-07-07 DIAGNOSIS — N99840 Postprocedural hematoma of a genitourinary system organ or structure following a genitourinary system procedure: Secondary | ICD-10-CM | POA: Diagnosis not present

## 2020-07-08 DIAGNOSIS — D631 Anemia in chronic kidney disease: Secondary | ICD-10-CM | POA: Diagnosis not present

## 2020-07-08 DIAGNOSIS — N2581 Secondary hyperparathyroidism of renal origin: Secondary | ICD-10-CM | POA: Diagnosis not present

## 2020-07-08 DIAGNOSIS — N186 End stage renal disease: Secondary | ICD-10-CM | POA: Diagnosis not present

## 2020-07-08 DIAGNOSIS — Z992 Dependence on renal dialysis: Secondary | ICD-10-CM | POA: Diagnosis not present

## 2020-07-08 DIAGNOSIS — D509 Iron deficiency anemia, unspecified: Secondary | ICD-10-CM | POA: Diagnosis not present

## 2020-07-09 DIAGNOSIS — C641 Malignant neoplasm of right kidney, except renal pelvis: Secondary | ICD-10-CM | POA: Diagnosis not present

## 2020-07-09 DIAGNOSIS — D630 Anemia in neoplastic disease: Secondary | ICD-10-CM | POA: Diagnosis not present

## 2020-07-09 DIAGNOSIS — I9589 Other hypotension: Secondary | ICD-10-CM | POA: Diagnosis not present

## 2020-07-09 DIAGNOSIS — I132 Hypertensive heart and chronic kidney disease with heart failure and with stage 5 chronic kidney disease, or end stage renal disease: Secondary | ICD-10-CM | POA: Diagnosis not present

## 2020-07-09 DIAGNOSIS — I2589 Other forms of chronic ischemic heart disease: Secondary | ICD-10-CM | POA: Diagnosis not present

## 2020-07-09 DIAGNOSIS — N99840 Postprocedural hematoma of a genitourinary system organ or structure following a genitourinary system procedure: Secondary | ICD-10-CM | POA: Diagnosis not present

## 2020-07-10 DIAGNOSIS — Z992 Dependence on renal dialysis: Secondary | ICD-10-CM | POA: Diagnosis not present

## 2020-07-10 DIAGNOSIS — D509 Iron deficiency anemia, unspecified: Secondary | ICD-10-CM | POA: Diagnosis not present

## 2020-07-10 DIAGNOSIS — N186 End stage renal disease: Secondary | ICD-10-CM | POA: Diagnosis not present

## 2020-07-10 DIAGNOSIS — D631 Anemia in chronic kidney disease: Secondary | ICD-10-CM | POA: Diagnosis not present

## 2020-07-11 ENCOUNTER — Encounter: Payer: Medicare Other | Attending: Physical Medicine & Rehabilitation | Admitting: Physical Medicine & Rehabilitation

## 2020-07-11 ENCOUNTER — Ambulatory Visit (INDEPENDENT_AMBULATORY_CARE_PROVIDER_SITE_OTHER): Payer: Medicare Other | Admitting: Urology

## 2020-07-11 ENCOUNTER — Other Ambulatory Visit: Payer: Self-pay

## 2020-07-11 ENCOUNTER — Encounter: Payer: Self-pay | Admitting: Urology

## 2020-07-11 ENCOUNTER — Encounter: Payer: Self-pay | Admitting: Physical Medicine & Rehabilitation

## 2020-07-11 VITALS — BP 148/70 | HR 74 | Temp 98.8°F | Ht 74.0 in | Wt 223.0 lb

## 2020-07-11 VITALS — BP 113/58 | HR 73 | Temp 98.2°F | Ht 74.0 in | Wt 218.9 lb

## 2020-07-11 DIAGNOSIS — R5381 Other malaise: Secondary | ICD-10-CM | POA: Diagnosis not present

## 2020-07-11 DIAGNOSIS — C641 Malignant neoplasm of right kidney, except renal pelvis: Secondary | ICD-10-CM

## 2020-07-11 NOTE — Progress Notes (Signed)
07/11/2020 12:09 PM   Todd Robertson 04-24-1946 [id number]  Referring provider: Monico Blitz, MD [address] [place],   [address]  followup right radical nephroectomy   HPI: Mr Entwistle is a 74yo here for followup for Papillary RCC. He underwent radical on 05/16/2020. Hospital course was complicated by renal failure requiring dialysis and respiratory failure requiring reintubation. Patient was discharged to a nurseing facility and was discharged this past week. He in doing well. Good mobility. Strength improving. He is on HD 3 times per week. He makes 2-3 cups of urine daily. No LUTS    PMH: Past Medical History:  Diagnosis Date  . Arthritis   . Cancer [facility])    prostate  . Cardiomyopathy    secondary  . CKD (chronic kidney disease)   . DM2 (diabetes mellitus, type 2) ([place])   . Gout   . HTN (hypertension)    unspec  . Hypercholesterolemia   . Nonischemic cardiomyopathy ([place])    a. EF 40-50% by most recent 2-D echo b. EF 25% by cardiac cath in 2004  . Overweight(278.02)     Surgical History: Past Surgical History:  Procedure Laterality Date  . AV FISTULA PLACEMENT Left 06/06/2020   Procedure: LEFT ARM ARTERIOVENOUS (AV) GRAFT USING 45CM GORTEX;  Surgeon: [name], MD;  Location: [place];  Service: Vascular;  Laterality: Left;  . COLONOSCOPY    . HERNIA REPAIR    . INSERTION OF DIALYSIS CATHETER Right 06/06/2020   Procedure: INSERTION OF DIALYSIS CATHETER USING 23CM DOUBLE LUMEN CATHETER;  Surgeon: [name], MD;  Location: [place];  Service: Vascular;  Laterality: Right;  . IR FLUORO GUIDE CV LINE RIGHT  05/20/2020  . IR US GUIDE VASC ACCESS RIGHT  05/20/2020  . KNEE ARTHROPLASTY Left 08/08/2017   Procedure: LEFT TOTAL KNEE ARTHROPLASTY WITH COMPUTER NAVIGATION;  Surgeon: Rod Can, MD;  Location: [place];  Service: Orthopedics;  Laterality: Left;  Needs RNFA  . KNEE ARTHROPLASTY Right 11/17/2017   Procedure: RIGHT TOTAL KNEE ARTHROPLASTY WITH COMPUTER NAVIGATION;   Surgeon: Rod Can, MD;  [facility];  Service: Orthopedics;  Laterality: Right;  NEEDS RNFA  . ROBOT ASSISTED LAPAROSCOPIC NEPHRECTOMY Right 05/16/2020   Procedure: XI ROBOTIC ASSISTED LAPAROSCOPIC NEPHRECTOMY;  Surgeon: Cleon Gustin, MD;  [facility];  Service: Urology;  Laterality: Right;  2.5 hrs    Home Medications:  Allergies as of 07/11/2020   No Known Allergies     Medication List       Accurate as of July 11, 2020 12:09 PM. If you have any questions, ask your nurse or doctor.        acetaminophen 325 MG tablet Commonly known as: TYLENOL Take 1-2 tablets (325-650 mg total) by mouth every 4 (four) hours as needed for mild pain.   albuterol 108 (90 Base) MCG/ACT inhaler Commonly known as: VENTOLIN HFA Inhale 1-2 puffs into the lungs every 6 (six) hours as needed for wheezing or shortness of breath.   atorvastatin 40 MG tablet Commonly known as: LIPITOR Take 1 tablet (40 mg total) by mouth daily.   budesonide-formoterol 160-4.5 MCG/ACT inhaler Commonly known as: SYMBICORT Inhale 2 puffs into the lungs 2 (two) times daily.   calcium acetate 667 MG capsule Commonly known as: PHOSLO Take 3 capsules (2,001 mg total) by mouth 3 (three) times daily with meals.   hydrocortisone 10 MG tablet Commonly known as: CORTEF Take 1 tablet (10 mg total) by mouth 2 (two) times daily  with a meal.   insulin lispro 100 UNIT/ML KwikPen Commonly known as: HUMALOG Inject into the skin.   loratadine 10 MG tablet Commonly known as: CLARITIN Take 10 mg by mouth daily.   metoprolol tartrate 25 MG tablet Commonly known as: LOPRESSOR Take 0.5 tablets (12.5 mg total) by mouth 2 (two) times daily.   midodrine 10 MG tablet Commonly known as: PROAMATINE Take 1 tablet (10 mg total) by mouth 3 (three) times daily with meals.   multivitamin Tabs tablet Take 1 tablet by mouth at bedtime.   senna 8.6 MG tablet Commonly known as: SENOKOT Take 1 tablet by mouth daily  as needed for constipation.   Vitamin D 50 MCG (2000 UT) tablet Take 1 tablet (2,000 Units total) by mouth daily.       Allergies: No Known Allergies  Family History: Family History  Problem Relation Age of Onset  . Hypertension Mother   . Kidney disease Mother   . Heart disease Father   . Kidney disease Other     Social History:  reports that he quit smoking about 35 years ago. His smoking use included cigarettes. He started smoking about 54 years ago. He has a 10.00 pack-year smoking history. He has never used smokeless tobacco. He reports that he does not drink alcohol and does not use drugs.  ROS: All other review of systems were reviewed and are negative except what is noted above in HPI  Physical Exam: BP (!) 113/58   Pulse 73   Temp 98.2 F (36.8 C)   Ht 6\' 2"  (1.88 m)   Wt 218 lb 14.7 oz (99.3 kg)   BMI 28.11 kg/m   Constitutional:  Alert and oriented, No acute distress. HEENT: [place] AT, moist mucus membranes.  Trachea midline, no masses. Cardiovascular: No clubbing, cyanosis, or edema. Respiratory: Normal respiratory effort, no increased work of breathing. GI: Abdomen is soft, nontender, nondistended, no abdominal masses GU: No CVA tenderness.  Lymph: No cervical or inguinal lymphadenopathy. Skin: No rashes, bruises or suspicious lesions. Neurologic: Grossly intact, no focal deficits, moving all 4 extremities. Psychiatric: Normal mood and affect.  Laboratory Data: Lab Results  Component Value Date   WBC 13.5 (H) 06/24/2020   HGB 9.7 (L) 06/24/2020   HCT 32.2 (L) 06/24/2020   MCV 100.3 (H) 06/24/2020   PLT 183 06/24/2020    Lab Results  Component Value Date   CREATININE 11.00 (H) 06/24/2020    No results found for: PSA  No results found for: TESTOSTERONE  Lab Results  Component Value Date   HGBA1C 6.2 (H) 05/13/2020    Urinalysis    Component Value Date/Time   BILIRUBINUR neg 02/13/2020 1122   PROTEINUR Negative 02/13/2020 1122    UROBILINOGEN 0.2 02/13/2020 1122   NITRITE neg 02/13/2020 1122   LEUKOCYTESUR Negative 02/13/2020 1122    No results found for: LABMICR, [place], RBCUA, LABEPIT, MUCUS, BACTERIA  Pertinent Imaging:  Results for orders placed during the hospital encounter of 06/10/20  DG Abd 1 View  Narrative CLINICAL DATA:  Abdominal distention.  EXAM: ABDOMEN - 1 VIEW  COMPARISON:  05/30/2020  FINDINGS: Nasogastric tube has been removed. Bowel gas pattern is nonobstructed. No evidence for free intraperitoneal air or organomegaly.  IMPRESSION: Nonobstructive bowel gas pattern.   Electronically Signed By: Nolon Nations M.D. On: 06/14/2020 10:14  No results found for this or any previous visit.  No results found for this or any previous visit.  No results found for this or any  previous visit.  Results for orders placed during the hospital encounter of 12/25/19  US RENAL  Narrative CLINICAL DATA:  Chronic kidney disease.  EXAM: RENAL / URINARY TRACT ULTRASOUND COMPLETE  COMPARISON:  None.  FINDINGS: Right Kidney:  Renal measurements: 14.0 x 8.9 x 7.9 cm = volume: 511 mL. Complex avascular versus hypovascular mass in the lower lateral kidney measures 5.7 x 2.4 x 5.8 cm. Additional 2.9 x 2.4 x 2.5 cm cyst is seen in the mid kidney. No hydronephrosis.  Left Kidney:  Renal measurements: 12.9 x 6.5 x 5.7 cm = volume: 252 mL. Echogenicity within normal limits. Cyst in the upper medial kidney measures 1.6 x 1.5 x 1.6 cm. No solid mass or hydronephrosis visualized.  Bladder:  Nondistended and not well evaluated.  Other:  None.  IMPRESSION: 1. Complex right renal mass measuring greater than 5 cm. This is incompletely characterized by sonography. Recommend renal protocol MRI (IV contrast if renal function permits). 2. Additional cysts in both kidneys. 3. Bladder nondistended and not evaluated.   Electronically Signed By: Keith Rake M.D. On: 12/25/2019  23:26  No results found for this or any previous visit.  No results found for this or any previous visit.  Results for orders placed during the hospital encounter of 02/01/20  CT RENAL STONE STUDY  Narrative CLINICAL DATA:  Indeterminate right renal mass on recent ultrasound. Renal insufficiency.  EXAM: CT ABDOMEN AND PELVIS WITHOUT CONTRAST  TECHNIQUE: Multidetector CT imaging of the abdomen and pelvis was performed following the standard protocol without IV contrast.  COMPARISON:  12/25/2019 renal sonogram.  FINDINGS: Lower chest: No significant pulmonary nodules or acute consolidative airspace disease. Coronary atherosclerosis.  Hepatobiliary: Normal liver size. No liver mass. Normal gallbladder with no radiopaque cholelithiasis. No biliary ductal dilatation.  Pancreas: Normal, with no mass or duct dilation.  Spleen: Normal size. No mass.  Adrenals/Urinary Tract: Normal adrenals. Soft tissue density partially exophytic 6.4 x 5.4 cm renal cortical mass in the medial upper right kidney (series 2/image 24). Simple 2.8 cm posterior interpolar right renal cyst. Simple 1.7 cm anterior lower left renal cyst. No renal stones. No hydronephrosis. Normal caliber ureters. No ureteral stones. Normal bladder.  Stomach/Bowel: Normal non-distended stomach. Normal caliber small bowel with no small bowel wall thickening. Normal appendix. Scattered minimal colonic diverticulosis with no large bowel wall thickening or significant pericolonic fat stranding.  Vascular/Lymphatic: Atherosclerotic nonaneurysmal abdominal aorta. No pathologically enlarged lymph nodes in the abdomen or pelvis.  Reproductive: Top-normal size prostate. Fiducial markers surrounding the prostate  Other: No pneumoperitoneum, ascites or focal fluid collection. Small fat containing umbilical hernia.  Musculoskeletal: No aggressive appearing focal osseous lesions. Marked lumbar spondylosis.  IMPRESSION: 1.  Soft tissue density partially exophytic 6.4 cm renal cortical mass in the medial upper right kidney, incompletely characterized on this noncontrast study (patient unable to receive IV contrast due to GFR below 30). Renal cell carcinoma is to be excluded. If the patient's renal function improves (GFR greater than or equal to 30), renal mass protocol MRI (preferred) or CT abdomen without and with IV contrast is indicated for further evaluation. 2. No evidence of metastatic disease in the abdomen or pelvis on this noncontrast CT study. 3. Coronary atherosclerosis. 4. Aortic Atherosclerosis (ICD10-I70.0).   Electronically Signed By: Ilona Sorrel M.D. On: 02/02/2020 16:52   Assessment & Plan:    1. Renal cell carcinoma of right kidney (HCC) -RTC 6 months with renal US - Urinalysis, Routine w reflex microscopic   No  follow-ups on file.  Nicolette Bang, MD  [facility] [place]

## 2020-07-11 NOTE — Progress Notes (Signed)
Subjective:    Patient ID: Todd Robertson, male    DOB: Feb 03, 1946, 74 y.o.   MRN: [id number] 74 y.o. 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  He was admitted on 05/16/1990 for right nephrectomy with lysis of adhesion by Dr. Alyson Ingles.  Postop course significant for issues with acute on chronic renal failure, acute blood loss anemia due to large volume hemorrhage into the right nephrectomy bed as well as issues with volume overload and metabolic encephalopathy due to uremia.  He developed septic shock and was treated with broad-spectrum antibiotics for HCAP.  He has had complicated hospital course with recurrent fevers issues with persistent hypotension as well as A. fib with RVR.  He required CRRT and has been transitioned to hemodialysis with left forearm graft placed on 07/31 by Dr. Donnetta Hutching.  Midodrine on board to help manage hypotension and stress dose steroids were being weaned off.  Admit date: 06/10/2020 Discharge date: 06/25/2020  HPI  74 year old male with history of cardiomyopathy and renal failure who is here in follow-up after discharge from inpatient rehabilitation on 06/25/2020. His length of stay was approximately 2 weeks. He discharged to home where he is living with his wife. Wife accompanies patient. He is not driving. His cognitive status has cleared and feels like he is doing well at home. Home health therapy is assisting the patient and they told him they would be working with him for approximately 1 month. SOB with ambulation in house, especially if he walks on his deck and it is hot out. Has appt with cardiology next week  Dresses Mod I, Needs help with bathing Walks without assistive device Getting dialysis Tues, THurs, Sat  via catheter again since graft did not work  Pain Inventory Average Pain 0 Pain Right Now 0 My pain is No pain.  In the last 24 hours, has pain interfered with the following? General activity 0 Relation with others  0 Enjoyment of life 0 What TIME of day is your pain at its worst? No pain. Sleep (in general) Good  Pain is worse with: No pain. Pain improves with: No problem. Relief from Meds: No pain meds taken.  use a walker how many minutes can you walk? 2 - 3 mins ability to climb steps?  yes do you drive?  no use a wheelchair  retired I need assistance with the following:  dressing and bathing Do you have any goals in this area?  yes  trouble walking  Any changes since last visit?  no New Patient   Any changes since last visit?  no New Patient    Family History  Problem Relation Age of Onset  . Hypertension Mother   . Kidney disease Mother   . Heart disease Father   . Kidney disease Other    Social History   Socioeconomic History  . Marital status: Married    Spouse name: Not on file  . Number of children: 2  . Years of education: Not on file  . Highest education level: Not on file  Occupational History  . Occupation: retired  Tobacco Use  . Smoking status: Former Smoker    Packs/day: 0.50    Years: 20.00    Pack years: 10.00    Types: Cigarettes    Start date: 02/12/1966    Quit date: 11/08/1984    Years since quitting: 35.6  . Smokeless tobacco: Never Used  Vaping Use  . Vaping Use: Never used  Substance and Sexual Activity  . Alcohol use: No    Alcohol/week: 0.0 standard drinks  . Drug use: No  . Sexual activity: Yes    Birth control/protection: None  Other Topics Concern  . Not on file  Social History Narrative   Full time.    Social Determinants of Health   Financial Resource Strain:   . Difficulty of Paying Living Expenses: Not on file  Food Insecurity:   . Worried About Charity fundraiser in the Last Year: Not on file  . Ran Out of Food in the Last Year: Not on file  Transportation Needs:   . Lack of Transportation (Medical): Not on file  . Lack of Transportation (Non-Medical): Not on file  Physical Activity:   . Days of Exercise per Week:  Not on file  . Minutes of Exercise per Session: Not on file  Stress:   . Feeling of Stress : Not on file  Social Connections:   . Frequency of Communication with Friends and Family: Not on file  . Frequency of Social Gatherings with Friends and Family: Not on file  . Attends Religious Services: Not on file  . Active Member of Clubs or Organizations: Not on file  . Attends Archivist Meetings: Not on file  . Marital Status: Not on file   Past Surgical History:  Procedure Laterality Date  . AV FISTULA PLACEMENT Left 06/06/2020   Procedure: LEFT ARM ARTERIOVENOUS (AV) GRAFT USING 45CM GORTEX;  Surgeon: [name], MD;  Location: [place];  Service: Vascular;  Laterality: Left;  . COLONOSCOPY    . HERNIA REPAIR    . INSERTION OF DIALYSIS CATHETER Right 06/06/2020   Procedure: INSERTION OF DIALYSIS CATHETER USING 23CM DOUBLE LUMEN CATHETER;  Surgeon: [name], MD;  Location: [place];  Service: Vascular;  Laterality: Right;  . IR FLUORO GUIDE CV LINE RIGHT  05/20/2020  . IR US GUIDE VASC ACCESS RIGHT  05/20/2020  . KNEE ARTHROPLASTY Left 08/08/2017   Procedure: LEFT TOTAL KNEE ARTHROPLASTY WITH COMPUTER NAVIGATION;  Surgeon: Rod Can, MD;  Location: [place];  Service: Orthopedics;  Laterality: Left;  Needs RNFA  . KNEE ARTHROPLASTY Right 11/17/2017   Procedure: RIGHT TOTAL KNEE ARTHROPLASTY WITH COMPUTER NAVIGATION;  Surgeon: Rod Can, MD;  [facility];  Service: Orthopedics;  Laterality: Right;  NEEDS RNFA  . ROBOT ASSISTED LAPAROSCOPIC NEPHRECTOMY Right 05/16/2020   Procedure: XI ROBOTIC ASSISTED LAPAROSCOPIC NEPHRECTOMY;  Surgeon: Cleon Gustin, MD;  [facility];  Service: Urology;  Laterality: Right;  2.5 hrs   Past Medical History:  Diagnosis Date  . Arthritis   . Cancer [facility])    prostate  . Cardiomyopathy    secondary  . CKD (chronic kidney disease)   . DM2 (diabetes mellitus, type 2) ([place])   . Gout   . HTN (hypertension)    unspec  .  Hypercholesterolemia   . Nonischemic cardiomyopathy ([place])    a. EF 40-50% by most recent 2-D echo b. EF 25% by cardiac cath in 2004  . Overweight(278.02)    BP (!) 148/70   Pulse 74   Temp 98.8 F (37.1 C)   Ht 6\' 2"  (1.88 m)   Wt 223 lb (101.2 kg)   SpO2 97%   BMI 28.63 kg/m   Opioid Risk Score:   Fall Risk Score:  `1  Depression screen PHQ 2/9  No flowsheet data found. Review of Systems  Constitutional: Negative.   HENT: Negative.  Eyes: Negative.   Respiratory: Positive for shortness of breath.   Cardiovascular: Negative.   Gastrointestinal: Negative.   Endocrine: Negative.   Genitourinary: Negative.   Musculoskeletal: Positive for gait problem.  Skin: Negative.   Allergic/Immunologic: Negative.   Hematological: Negative.   Psychiatric/Behavioral: Negative.   All other systems reviewed and are negative.      Objective:   Physical Exam Vitals and nursing note reviewed.  Constitutional:      Appearance: He is normal weight.  HENT:     Head: Normocephalic and atraumatic.  Eyes:     Extraocular Movements: Extraocular movements intact.     Conjunctiva/sclera: Conjunctivae normal.     Pupils: Pupils are equal, round, and reactive to light.  Cardiovascular:     Rate and Rhythm: Normal rate and regular rhythm.  Pulmonary:     Effort: No respiratory distress.     Breath sounds: Normal breath sounds. No stridor.  Abdominal:     General: Abdomen is flat. There is no distension.     Palpations: Abdomen is soft. There is no mass.     Tenderness: There is no abdominal tenderness.  Musculoskeletal:     Cervical back: Normal range of motion.  Neurological:     Mental Status: He is alert and oriented to person, place, and time.     Comments: Motor strength is 5/5 bilateral deltoid bicep tricep grip hip flexion extensor ankle dorsiflexor Patient is able to ambulate without assist device no evidence of toe drag or knee instability he has a wide-based support and tends  to rock backwards.  Psychiatric:        Mood and Affect: Mood normal.        Behavior: Behavior normal.           Assessment & Plan:  #1. Debility related to renal carcinoma with complicated postoperative course after nephrectomy, now on dialysis. His encephalopathy has resolved. He still has overall weakness, endurance is poor and is related to cardiomyopathy as well as his renal failure. He is functioning at a near independent level however. Once he finishes home health PT OT he will likely not need any outpatient therapy. I plan to see him on a as needed basis. He will follow-up with nephrology as well as cardiology and urology in addition to his primary physician

## 2020-07-11 NOTE — Patient Instructions (Signed)
Kidney Cancer  Kidney cancer is an abnormal growth of cells in one or both kidneys. The kidneys filter waste from your blood and produce urine. Kidney cancer may spread to other parts of your body. This type of cancer may also be called renal cell carcinoma. What are the causes? The cause of this condition is not always known. In some cases, abnormal changes to genes (genetic mutations) can cause cells to form cancer. What increases the risk? You may be more likely to develop kidney cancer if you:  Are over age 60. The risk increases with age.  Have a family history of kidney cancer.  Are of African-American, Native American, or Native [place] descent.  Smoke.  Are male.  Are obese.  Have high blood pressure (hypertension).  Have advanced kidney disease, especially if you need long-term dialysis.  Have certain conditions that are passed from parent to child (inherited), such as von Hippel-Lindau disease, tuberous sclerosis, or hereditary papillary renal carcinoma.  Have been exposed to certain chemicals. What are the signs or symptoms? In the early stages, kidney cancer does not cause symptoms. As the cancer grows, symptoms may include:  Blood in the urine.  Pain in the upper back or abdomen, just below the rib cage. You may feel pain on one or both sides of the body.  Fatigue.  Unexplained weight loss.  Fever. How is this diagnosed? This condition may be diagnosed based on:  Your symptoms and medical history.  A physical exam.  Blood and urine tests.  X-rays.  Imaging tests, such as CT scans, MRIs, and PET scans.  Having dye injected into your blood through an IV, and then having X-rays taken of: ? Your kidneys and the rest of the organs involved in making and storing urine (intravenous pyelogram). ? Your blood vessels (angiogram).  Removal and testing of a kidney tissue sample (biopsy). Your cancer will be assessed (staged), based on how severe it is and how  much it has spread. How is this treated? Treatment depends on the type and stage of the cancer. Treatment may include one or more of the following:  Surgery. This may include surgery to remove: ? Just the tumor (nephron-sparing surgery). ? The entire kidney (nephrectomy). ? The kidney, some of the surrounding healthy tissue, nearby lymph nodes, and the adrenal gland in certain cases (radical nephrectomy).  Medicines that kill cancer cells (chemotherapy).  High-energy rays that kill cancer cells (radiation therapy).  Targeted therapy. This targets specific parts of cancer cells and the area around them to block the growth and the spread of the cancer. Targeted therapy can help to limit the damage to healthy cells.  Medicines that help your body's disease-fighting system (immune system) fight cancer cells (immunotherapy).  Freezing cancer cells using gas or liquid that is delivered through a needle (cryoablation).  Destroying cancer cells using high-energy radio waves that are delivered through a needle-like probe (radiofrequency ablation).  A procedure to block the artery that supplies blood to the tumor, which kills the cancer cells (embolization). Follow these instructions at home: Eating and drinking  Some of your treatments might affect your appetite and your ability to chew and swallow. If you are having problems eating, or if you do not have an appetite, meet with a diet and nutrition specialist (dietitian).  If you have side effects that affect eating, it may help to: ? Eat smaller meals and snacks often. ? Drink high-nutrition and high-calorie shakes or supplements. ? Eat bland and soft   foods that are easy to eat. ? Not eat foods that are hot, spicy, or hard to swallow. Lifestyle  Do not drink alcohol.  Do not use any products that contain nicotine or tobacco, such as cigarettes and e-cigarettes. If you need help quitting, ask your health care provider. General  instructions   Take over-the-counter and prescription medicines only as told by your health care provider. This includes vitamins, supplements, and herbal products.  Consider joining a support group to help you cope with the stress of having kidney cancer.  Work with your health care provider to manage any side effects of treatment.  Keep all follow-up visits as told by your health care provider. This is important. Where to find more information  American Cancer Society: https://www.cancer.org  National Cancer Institute (NCI): https://www.cancer.gov Contact a health care provider if you:  Notice that you bruise or bleed easily.  Are losing weight without trying.  Have new or increased fatigue or weakness. Get help right away if you have:  Blood in your urine.  A sudden increase in pain.  A fever.  Shortness of breath.  Chest pain.  Yellow skin or whites of your eyes (jaundice). Summary  Kidney cancer is an abnormal growth of cells (tumor) in one or both kidneys. Tumors may spread to other parts of your body.  In the early stages, kidney cancer does not cause symptoms. As the cancer grows, symptoms may include blood in the urine, pain in the upper back or abdomen, unexplained weight loss, fatigue, and fever.  Treatment depends on the type and stage of the cancer. It may include surgery to remove the tumor, procedures and medicines to kill the cancer cells, or medicines to help your body fight cancer cells. This information is not intended to replace advice given to you by your health care provider. Make sure you discuss any questions you have with your health care provider. Document Revised: 11/16/2017 Document Reviewed: 11/13/2017 Elsevier Patient Education  2020 Elsevier Inc.  

## 2020-07-11 NOTE — Progress Notes (Signed)
  Urological Symptom Review  Patient is experiencing the following symptoms: none   Review of Systems  Gastrointestinal (upper)  : Indigestion/heartburn  Gastrointestinal (lower) : Constipation  Constitutional : Fatigue  Skin: Itching  Eyes: Negative for eye symptoms  Ear/Nose/Throat : Negative for Ear/Nose/Throat symptoms  Hematologic/Lymphatic: Negative for Hematologic/Lymphatic symptoms  Cardiovascular : Negative for cardiovascular symptoms  Respiratory : Cough  Endocrine: Negative for endocrine symptoms  Musculoskeletal: Negative for musculoskeletal symptoms  Neurological: Headaches Dizziness  Psychologic: Negative for psychiatric symptoms

## 2020-07-11 NOTE — Patient Instructions (Signed)
Please keep up with home exercise program once home health therapy finishes

## 2020-07-12 DIAGNOSIS — D631 Anemia in chronic kidney disease: Secondary | ICD-10-CM | POA: Diagnosis not present

## 2020-07-12 DIAGNOSIS — Z992 Dependence on renal dialysis: Secondary | ICD-10-CM | POA: Diagnosis not present

## 2020-07-12 DIAGNOSIS — N186 End stage renal disease: Secondary | ICD-10-CM | POA: Diagnosis not present

## 2020-07-12 DIAGNOSIS — D509 Iron deficiency anemia, unspecified: Secondary | ICD-10-CM | POA: Diagnosis not present

## 2020-07-14 DIAGNOSIS — I132 Hypertensive heart and chronic kidney disease with heart failure and with stage 5 chronic kidney disease, or end stage renal disease: Secondary | ICD-10-CM | POA: Diagnosis not present

## 2020-07-14 DIAGNOSIS — D630 Anemia in neoplastic disease: Secondary | ICD-10-CM | POA: Diagnosis not present

## 2020-07-14 DIAGNOSIS — I2589 Other forms of chronic ischemic heart disease: Secondary | ICD-10-CM | POA: Diagnosis not present

## 2020-07-14 DIAGNOSIS — C641 Malignant neoplasm of right kidney, except renal pelvis: Secondary | ICD-10-CM | POA: Diagnosis not present

## 2020-07-14 DIAGNOSIS — I9589 Other hypotension: Secondary | ICD-10-CM | POA: Diagnosis not present

## 2020-07-14 DIAGNOSIS — N99840 Postprocedural hematoma of a genitourinary system organ or structure following a genitourinary system procedure: Secondary | ICD-10-CM | POA: Diagnosis not present

## 2020-07-15 DIAGNOSIS — D631 Anemia in chronic kidney disease: Secondary | ICD-10-CM | POA: Diagnosis not present

## 2020-07-15 DIAGNOSIS — N186 End stage renal disease: Secondary | ICD-10-CM | POA: Diagnosis not present

## 2020-07-15 DIAGNOSIS — D509 Iron deficiency anemia, unspecified: Secondary | ICD-10-CM | POA: Diagnosis not present

## 2020-07-15 DIAGNOSIS — Z992 Dependence on renal dialysis: Secondary | ICD-10-CM | POA: Diagnosis not present

## 2020-07-16 ENCOUNTER — Other Ambulatory Visit: Payer: Self-pay

## 2020-07-16 ENCOUNTER — Ambulatory Visit (INDEPENDENT_AMBULATORY_CARE_PROVIDER_SITE_OTHER): Payer: Medicare Other | Admitting: Family Medicine

## 2020-07-16 ENCOUNTER — Encounter: Payer: Self-pay | Admitting: Family Medicine

## 2020-07-16 VITALS — BP 106/60 | HR 94 | Ht 74.0 in | Wt 235.8 lb

## 2020-07-16 DIAGNOSIS — I10 Essential (primary) hypertension: Secondary | ICD-10-CM | POA: Diagnosis not present

## 2020-07-16 DIAGNOSIS — E1122 Type 2 diabetes mellitus with diabetic chronic kidney disease: Secondary | ICD-10-CM | POA: Diagnosis not present

## 2020-07-16 DIAGNOSIS — I428 Other cardiomyopathies: Secondary | ICD-10-CM | POA: Diagnosis not present

## 2020-07-16 DIAGNOSIS — I871 Compression of vein: Secondary | ICD-10-CM | POA: Diagnosis not present

## 2020-07-16 DIAGNOSIS — Z992 Dependence on renal dialysis: Secondary | ICD-10-CM | POA: Diagnosis not present

## 2020-07-16 DIAGNOSIS — I493 Ventricular premature depolarization: Secondary | ICD-10-CM | POA: Diagnosis not present

## 2020-07-16 DIAGNOSIS — Z299 Encounter for prophylactic measures, unspecified: Secondary | ICD-10-CM | POA: Diagnosis not present

## 2020-07-16 DIAGNOSIS — Z6830 Body mass index (BMI) 30.0-30.9, adult: Secondary | ICD-10-CM | POA: Diagnosis not present

## 2020-07-16 DIAGNOSIS — N186 End stage renal disease: Secondary | ICD-10-CM | POA: Diagnosis not present

## 2020-07-16 DIAGNOSIS — E782 Mixed hyperlipidemia: Secondary | ICD-10-CM

## 2020-07-16 DIAGNOSIS — E1165 Type 2 diabetes mellitus with hyperglycemia: Secondary | ICD-10-CM | POA: Diagnosis not present

## 2020-07-16 DIAGNOSIS — T82868A Thrombosis of vascular prosthetic devices, implants and grafts, initial encounter: Secondary | ICD-10-CM | POA: Diagnosis not present

## 2020-07-16 MED ORDER — METOPROLOL TARTRATE 25 MG PO TABS: 12.5000 mg | ORAL_TABLET | Freq: Two times a day (BID) | ORAL | 3 refills | Status: DC

## 2020-07-16 MED ORDER — MIDODRINE HCL 10 MG PO TABS: 10.0000 mg | ORAL_TABLET | Freq: Three times a day (TID) | ORAL | 3 refills | Status: DC

## 2020-07-16 NOTE — Progress Notes (Signed)
Cardiology Office Note  Date: 07/16/2020   ID: Todd Robertson, DOB 02/08/1946, MRN [id number]  PCP:  Monico Blitz, MD  Cardiologist:  Carlyle Dolly, MD Electrophysiologist:  None   Chief Complaint: Nonischemic cardiomyopathy  History of Present Illness: Todd Robertson is a 74 y.o. male with a history of nonischemic cardiomyopathy, HTN, HLD, CKD, PVCs, renal mass/renal cell carcinoma status post right nephrectomy.  NICM: Previous significant LV dysfunction 25 to 30% in 2004.  EF had normalized on echo 2011 with EF of 60.  Cath 2004 no significant CAD.  Echo 2018 EF 55 to 60%  Last saw Dr. Harl Bowie 03/31/2020.  Was having no shortness of breath or dyspnea on exertion.  No edema, home blood pressures 120s over 70s.  Compliant with statin medications, prostate cancer treated with [facility] at [facility] followed by urology.  Previous history of NSVT during knee replacement.  No recent palpitations.  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  Plans were for nephrectomy.  He was riding his stationary bike 5 days a week with no symptoms.  Recent right nephrectomy on 05/16/2020 by Dr. Alyson Ingles secondary to renal cell carcinoma.  He was admitted on 06/10/2020 and discharged on 06/25/2020.  Postop course was significant for acute on chronic renal failure, acute blood loss anemia due to large volume hemorrhage into the right nephrectomy bed as well as issues with volume overload and metabolic encephalopathy secondary to acute uremia.  He developed septic shock and was treated with antibiotics for hospital/community acquired pneumonia.  Had a subsequent insertion of dialysis catheter for HD access.  Status post AV fistula placement on 06/06/2020 by Dr. Donnetta Hutching.  Had some issues with A. fib with RVR during hospital stay.  He required CRRT and had been transitioned to hemodialysis with left arm forearm graft placed by Dr. Dr. Donnetta Hutching.  He experienced some metabolic  encephalopathy due to uremia.   He is here today status post hospital follow up. He states he is doing well. Feels a little tired and has some mild dyspnea on exertional activity. He is undergoing HD on Tuesdays, Thursdays, and Saturdays. He is tolerating well. Denies any anginal symptoms, palpitations, orthostatic symptoms, PND, orthopnea.   Past Medical History:  Diagnosis Date   Arthritis    Cancer ([place])    prostate   Cardiomyopathy    secondary   CKD (chronic kidney disease)    DM2 (diabetes mellitus, type 2) (HCC)    Gout    HTN (hypertension)    unspec   Hypercholesterolemia    Nonischemic cardiomyopathy (HCC)    a. EF 40-50% by most recent 2-D echo b. EF 25% by cardiac cath in 2004   Overweight(278.02)     Past Surgical History:  Procedure Laterality Date   AV FISTULA PLACEMENT Left 06/06/2020   Procedure: LEFT ARM ARTERIOVENOUS (AV) GRAFT USING 45CM GORTEX;  Surgeon: [name], MD;  Location: [facility];  Service: Vascular;  Laterality: Left;   COLONOSCOPY     HERNIA REPAIR     INSERTION OF DIALYSIS CATHETER Right 06/06/2020   Procedure: INSERTION OF DIALYSIS CATHETER USING 23CM DOUBLE LUMEN CATHETER;  Surgeon: [name], MD;  Location: [facility];  Service: Vascular;  Laterality: Right;   IR FLUORO GUIDE CV LINE RIGHT  05/20/2020   IR US GUIDE VASC ACCESS RIGHT  05/20/2020   KNEE ARTHROPLASTY Left 08/08/2017   Procedure: LEFT TOTAL KNEE ARTHROPLASTY WITH COMPUTER NAVIGATION;  Surgeon: Lyla Glassing,  Aaron Edelman, MD;  Location: [facility];  Service: Orthopedics;  Laterality: Left;  Needs RNFA   KNEE ARTHROPLASTY Right 11/17/2017   Procedure: RIGHT TOTAL KNEE ARTHROPLASTY WITH COMPUTER NAVIGATION;  Surgeon: Rod Can, MD;  Location: [facility];  Service: Orthopedics;  Laterality: Right;  NEEDS RNFA   ROBOT ASSISTED LAPAROSCOPIC NEPHRECTOMY Right 05/16/2020   Procedure: XI ROBOTIC ASSISTED LAPAROSCOPIC NEPHRECTOMY;  Surgeon: Cleon Gustin, MD;  Location: [facility];  Service:  Urology;  Laterality: Right;  2.5 hrs    Current Outpatient Medications  Medication Sig Dispense Refill   acetaminophen (TYLENOL) 325 MG tablet Take 1-2 tablets (325-650 mg total) by mouth every 4 (four) hours as needed for mild pain.     albuterol (VENTOLIN HFA) 108 (90 Base) MCG/ACT inhaler Inhale 1-2 puffs into the lungs every 6 (six) hours as needed for wheezing or shortness of breath. 6.7 g 1   atorvastatin (LIPITOR) 40 MG tablet Take 1 tablet (40 mg total) by mouth daily. 90 tablet 3   budesonide-formoterol (SYMBICORT) 160-4.5 MCG/ACT inhaler Inhale 2 puffs into the lungs 2 (two) times daily. 1 each 12   calcium acetate (PHOSLO) 667 MG capsule Take 3 capsules (2,001 mg total) by mouth 3 (three) times daily with meals. 180 capsule 1   Cholecalciferol (VITAMIN D) 50 MCG (2000 UT) tablet Take 1 tablet (2,000 Units total) by mouth daily. 30 tablet 0   hydrocortisone (CORTEF) 10 MG tablet Take 1 tablet (10 mg total) by mouth 2 (two) times daily with a meal. 60 tablet 0   insulin lispro (HUMALOG) 100 UNIT/ML KwikPen Inject into the skin.     loratadine (CLARITIN) 10 MG tablet Take 10 mg by mouth daily.     metoprolol tartrate (LOPRESSOR) 25 MG tablet Take 0.5 tablets (12.5 mg total) by mouth 2 (two) times daily. 90 tablet 3   midodrine (PROAMATINE) 10 MG tablet Take 1 tablet (10 mg total) by mouth 3 (three) times daily with meals. 90 tablet 3   Multiple Vitamins-Minerals (CENTRUM SILVER 50+MEN) TABS Take 1 tablet by mouth daily.     multivitamin (RENA-VIT) TABS tablet Take 1 tablet by mouth at bedtime. 30 tablet 0   senna (SENOKOT) 8.6 MG tablet Take 1 tablet by mouth daily as needed for constipation.      No current facility-administered medications for this visit.   Allergies:  Patient has no known allergies.   Social History: The patient  reports that he quit smoking about 35 years ago. His smoking use included cigarettes. He started smoking about 54 years ago. He has a 10.00  pack-year smoking history. He has never used smokeless tobacco. He reports that he does not drink alcohol and does not use drugs.   Family History: The patient's family history includes Heart disease in his father; Hypertension in his mother; Kidney disease in his mother and another family member.   ROS:  Please see the history of present illness. Otherwise, complete review of systems is positive for none.  All other systems are reviewed and negative.   Physical Exam: VS:  BP 106/60    Pulse 94    Ht 6\' 2"  (1.88 m)    Wt 235 lb 12.8 oz (107 kg)    SpO2 99%    BMI 30.27 kg/m , BMI Body mass index is 30.27 kg/m.  Wt Readings from Last 3 Encounters:  07/16/20 235 lb 12.8 oz (107 kg)  07/11/20 223 lb (101.2 kg)  07/11/20 218 lb 14.7 oz (99.3 kg)  General: Patient appears comfortable at rest. Neck: Supple, no elevated JVP or carotid bruits, no thyromegaly. Lungs: Clear to auscultation, nonlabored breathing at rest. Cardiac: Regular rate and rhythm, no S3 or significant systolic murmur, no pericardial rub. Extremities: No pitting edema, distal pulses 2+. Skin: Warm and dry. Musculoskeletal: No kyphosis. Neuropsychiatric: Alert and oriented x3, affect grossly appropriate.  ECG:  EKG June 17, 2020 atrial flutter with RVR with 2-1 conduction.  Left axis deviation, right bundle branch block rate of 103.  Recent Labwork: 06/05/2020: ALT 18; AST 19 06/10/2020: Magnesium 2.4 06/24/2020: BUN 48; Creatinine, Ser 11.00; Hemoglobin 9.7; Platelets 183; Potassium 4.5; Sodium 135  No results found for: CHOL, TRIG, HDL, CHOLHDL, VLDL, LDLCALC, LDLDIRECT  Other Studies Reviewed Today:  Echocardiogram 05/28/2020. 1. Left ventricular ejection fraction, by estimation, is 45 to 50%. The left ventricle has mildly decreased function. The left ventricle demonstrates global hypokinesis. There is mild left ventricular hypertrophy. Left ventricular diastolic parameters are consistent with Grade II diastolic  dysfunction (pseudonormalization). 2. Right ventricular systolic function is normal. The right ventricular size is mildly enlarged. Tricuspid regurgitation signal is inadequate for assessing PA pressure. 3. Right atrial size was mildly dilated. 4. The mitral valve is normal in structure. No evidence of mitral valve regurgitation. No evidence of mitral stenosis. 5. The aortic valve is tricuspid. Aortic valve regurgitation is not visualized. Mild aortic valve sclerosis is present, with no evidence of aortic valve stenosis. 6. Aortic dilatation noted. 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 7. Technically difficult study with poor images.   Lower venous Doppler study 05/29/2020 RIGHT - There is no evidence of deep vein thrombosis in the lower extremity. - No cystic structure found in the popliteal fossa. LEFT: - There is no evidence of deep vein thrombosis in the lower extremity. - No cystic structure found in the popliteal fossa.  Assessment and Plan:  1. NICM (nonischemic cardiomyopathy) ([place])   2. Essential hypertension   3. Mixed hyperlipidemia   4. PVC's (premature ventricular contractions)    1. NICM (nonischemic cardiomyopathy) ([place]) Echo on 05/28/2020: EF 45 to 50%, global hypokinesis mild LVH, G2 DD, mild dilation of ascending aorta measuring 41 mm. Will recheck echocardiogram in 3 months to re-assess LV and diastolic function.. Previous echo 2018 LVEF 55-60%. G1DD, Mild MR.   2. Essential hypertension Blood pressure today 106/60. Was having low BP's during hospital stay and started on midodrine. Continue Midodrine 10 mg tid for now.  3. Mixed hyperlipidemia Continue atorvastatin. 40 mg po daily.  4. PVC's (premature ventricular contractions) Denies any palpitations or arrhythmias currently.  Patient had episodes of rapid atrial fib/atrial flutter during hospital stay.  Continue metoprolol 12.5 mg po bid.   Medication Adjustments/Labs and Tests  Ordered: Current medicines are reviewed at length with the patient today.  Concerns regarding medicines are outlined above.   Disposition: Follow-up with Dr Harl Bowie or APP 3 months  Signed, Levell July, NP 07/16/2020 10:44 PM    [facility] [address], [place], [place] [address] Phone: [phone]; Fax: [phone]

## 2020-07-16 NOTE — Patient Instructions (Addendum)
Medication Instructions:   Your physician recommends that you continue on your current medications as directed. Please refer to the Current Medication list given to you today.  Labwork:  None  Testing/Procedures: Your physician has requested that you have an echocardiogram in 4 months just before your next visit. Echocardiography is a painless test that uses sound waves to create images of your heart. It provides your doctor with information about the size and shape of your heart and how well your heart's chambers and valves are working. This procedure takes approximately one hour. There are no restrictions for this procedure.  Follow-Up:  Your physician recommends that you schedule a follow-up appointment in: 4 months.  Any Other Special Instructions Will Be Listed Below (If Applicable).  If you need a refill on your cardiac medications before your next appointment, please call your pharmacy.

## 2020-07-17 DIAGNOSIS — D631 Anemia in chronic kidney disease: Secondary | ICD-10-CM | POA: Diagnosis not present

## 2020-07-17 DIAGNOSIS — Z992 Dependence on renal dialysis: Secondary | ICD-10-CM | POA: Diagnosis not present

## 2020-07-17 DIAGNOSIS — E119 Type 2 diabetes mellitus without complications: Secondary | ICD-10-CM | POA: Diagnosis not present

## 2020-07-17 DIAGNOSIS — N186 End stage renal disease: Secondary | ICD-10-CM | POA: Diagnosis not present

## 2020-07-17 DIAGNOSIS — D509 Iron deficiency anemia, unspecified: Secondary | ICD-10-CM | POA: Diagnosis not present

## 2020-07-18 DIAGNOSIS — N99840 Postprocedural hematoma of a genitourinary system organ or structure following a genitourinary system procedure: Secondary | ICD-10-CM | POA: Diagnosis not present

## 2020-07-18 DIAGNOSIS — I2589 Other forms of chronic ischemic heart disease: Secondary | ICD-10-CM | POA: Diagnosis not present

## 2020-07-18 DIAGNOSIS — D630 Anemia in neoplastic disease: Secondary | ICD-10-CM | POA: Diagnosis not present

## 2020-07-18 DIAGNOSIS — C641 Malignant neoplasm of right kidney, except renal pelvis: Secondary | ICD-10-CM | POA: Diagnosis not present

## 2020-07-18 DIAGNOSIS — I132 Hypertensive heart and chronic kidney disease with heart failure and with stage 5 chronic kidney disease, or end stage renal disease: Secondary | ICD-10-CM | POA: Diagnosis not present

## 2020-07-18 DIAGNOSIS — I9589 Other hypotension: Secondary | ICD-10-CM | POA: Diagnosis not present

## 2020-07-19 DIAGNOSIS — N186 End stage renal disease: Secondary | ICD-10-CM | POA: Diagnosis not present

## 2020-07-19 DIAGNOSIS — Z992 Dependence on renal dialysis: Secondary | ICD-10-CM | POA: Diagnosis not present

## 2020-07-19 DIAGNOSIS — D631 Anemia in chronic kidney disease: Secondary | ICD-10-CM | POA: Diagnosis not present

## 2020-07-19 DIAGNOSIS — D509 Iron deficiency anemia, unspecified: Secondary | ICD-10-CM | POA: Diagnosis not present

## 2020-07-21 DIAGNOSIS — I2589 Other forms of chronic ischemic heart disease: Secondary | ICD-10-CM | POA: Diagnosis not present

## 2020-07-21 DIAGNOSIS — N99840 Postprocedural hematoma of a genitourinary system organ or structure following a genitourinary system procedure: Secondary | ICD-10-CM | POA: Diagnosis not present

## 2020-07-21 DIAGNOSIS — D630 Anemia in neoplastic disease: Secondary | ICD-10-CM | POA: Diagnosis not present

## 2020-07-21 DIAGNOSIS — I9589 Other hypotension: Secondary | ICD-10-CM | POA: Diagnosis not present

## 2020-07-21 DIAGNOSIS — C641 Malignant neoplasm of right kidney, except renal pelvis: Secondary | ICD-10-CM | POA: Diagnosis not present

## 2020-07-21 DIAGNOSIS — I132 Hypertensive heart and chronic kidney disease with heart failure and with stage 5 chronic kidney disease, or end stage renal disease: Secondary | ICD-10-CM | POA: Diagnosis not present

## 2020-07-22 DIAGNOSIS — Z992 Dependence on renal dialysis: Secondary | ICD-10-CM | POA: Diagnosis not present

## 2020-07-22 DIAGNOSIS — D631 Anemia in chronic kidney disease: Secondary | ICD-10-CM | POA: Diagnosis not present

## 2020-07-22 DIAGNOSIS — D509 Iron deficiency anemia, unspecified: Secondary | ICD-10-CM | POA: Diagnosis not present

## 2020-07-22 DIAGNOSIS — N186 End stage renal disease: Secondary | ICD-10-CM | POA: Diagnosis not present

## 2020-07-23 DIAGNOSIS — I132 Hypertensive heart and chronic kidney disease with heart failure and with stage 5 chronic kidney disease, or end stage renal disease: Secondary | ICD-10-CM | POA: Diagnosis not present

## 2020-07-23 DIAGNOSIS — C641 Malignant neoplasm of right kidney, except renal pelvis: Secondary | ICD-10-CM | POA: Diagnosis not present

## 2020-07-23 DIAGNOSIS — D630 Anemia in neoplastic disease: Secondary | ICD-10-CM | POA: Diagnosis not present

## 2020-07-23 DIAGNOSIS — N99840 Postprocedural hematoma of a genitourinary system organ or structure following a genitourinary system procedure: Secondary | ICD-10-CM | POA: Diagnosis not present

## 2020-07-23 DIAGNOSIS — I9589 Other hypotension: Secondary | ICD-10-CM | POA: Diagnosis not present

## 2020-07-23 DIAGNOSIS — I2589 Other forms of chronic ischemic heart disease: Secondary | ICD-10-CM | POA: Diagnosis not present

## 2020-07-24 DIAGNOSIS — N2581 Secondary hyperparathyroidism of renal origin: Secondary | ICD-10-CM | POA: Diagnosis not present

## 2020-07-24 DIAGNOSIS — I259 Chronic ischemic heart disease, unspecified: Secondary | ICD-10-CM | POA: Diagnosis not present

## 2020-07-24 DIAGNOSIS — Z79899 Other long term (current) drug therapy: Secondary | ICD-10-CM | POA: Diagnosis not present

## 2020-07-24 DIAGNOSIS — D509 Iron deficiency anemia, unspecified: Secondary | ICD-10-CM | POA: Diagnosis not present

## 2020-07-24 DIAGNOSIS — Z992 Dependence on renal dialysis: Secondary | ICD-10-CM | POA: Diagnosis not present

## 2020-07-24 DIAGNOSIS — N186 End stage renal disease: Secondary | ICD-10-CM | POA: Diagnosis not present

## 2020-07-24 DIAGNOSIS — D631 Anemia in chronic kidney disease: Secondary | ICD-10-CM | POA: Diagnosis not present

## 2020-07-24 DIAGNOSIS — Z23 Encounter for immunization: Secondary | ICD-10-CM | POA: Diagnosis not present

## 2020-07-25 DIAGNOSIS — C641 Malignant neoplasm of right kidney, except renal pelvis: Secondary | ICD-10-CM | POA: Diagnosis not present

## 2020-07-25 DIAGNOSIS — I9589 Other hypotension: Secondary | ICD-10-CM | POA: Diagnosis not present

## 2020-07-25 DIAGNOSIS — I2589 Other forms of chronic ischemic heart disease: Secondary | ICD-10-CM | POA: Diagnosis not present

## 2020-07-25 DIAGNOSIS — D630 Anemia in neoplastic disease: Secondary | ICD-10-CM | POA: Diagnosis not present

## 2020-07-25 DIAGNOSIS — N99840 Postprocedural hematoma of a genitourinary system organ or structure following a genitourinary system procedure: Secondary | ICD-10-CM | POA: Diagnosis not present

## 2020-07-25 DIAGNOSIS — I132 Hypertensive heart and chronic kidney disease with heart failure and with stage 5 chronic kidney disease, or end stage renal disease: Secondary | ICD-10-CM | POA: Diagnosis not present

## 2020-07-26 DIAGNOSIS — Z992 Dependence on renal dialysis: Secondary | ICD-10-CM | POA: Diagnosis not present

## 2020-07-26 DIAGNOSIS — D631 Anemia in chronic kidney disease: Secondary | ICD-10-CM | POA: Diagnosis not present

## 2020-07-26 DIAGNOSIS — D509 Iron deficiency anemia, unspecified: Secondary | ICD-10-CM | POA: Diagnosis not present

## 2020-07-26 DIAGNOSIS — Z23 Encounter for immunization: Secondary | ICD-10-CM | POA: Diagnosis not present

## 2020-07-26 DIAGNOSIS — N2581 Secondary hyperparathyroidism of renal origin: Secondary | ICD-10-CM | POA: Diagnosis not present

## 2020-07-26 DIAGNOSIS — N186 End stage renal disease: Secondary | ICD-10-CM | POA: Diagnosis not present

## 2020-07-27 DIAGNOSIS — I2589 Other forms of chronic ischemic heart disease: Secondary | ICD-10-CM | POA: Diagnosis not present

## 2020-07-27 DIAGNOSIS — Z8546 Personal history of malignant neoplasm of prostate: Secondary | ICD-10-CM | POA: Diagnosis not present

## 2020-07-27 DIAGNOSIS — I132 Hypertensive heart and chronic kidney disease with heart failure and with stage 5 chronic kidney disease, or end stage renal disease: Secondary | ICD-10-CM | POA: Diagnosis not present

## 2020-07-27 DIAGNOSIS — D630 Anemia in neoplastic disease: Secondary | ICD-10-CM | POA: Diagnosis not present

## 2020-07-27 DIAGNOSIS — E7800 Pure hypercholesterolemia, unspecified: Secondary | ICD-10-CM | POA: Diagnosis not present

## 2020-07-27 DIAGNOSIS — D62 Acute posthemorrhagic anemia: Secondary | ICD-10-CM | POA: Diagnosis not present

## 2020-07-27 DIAGNOSIS — M150 Primary generalized (osteo)arthritis: Secondary | ICD-10-CM | POA: Diagnosis not present

## 2020-07-27 DIAGNOSIS — M1030 Gout due to renal impairment, unspecified site: Secondary | ICD-10-CM | POA: Diagnosis not present

## 2020-07-27 DIAGNOSIS — Z794 Long term (current) use of insulin: Secondary | ICD-10-CM | POA: Diagnosis not present

## 2020-07-27 DIAGNOSIS — Z992 Dependence on renal dialysis: Secondary | ICD-10-CM | POA: Diagnosis not present

## 2020-07-27 DIAGNOSIS — Z87891 Personal history of nicotine dependence: Secondary | ICD-10-CM | POA: Diagnosis not present

## 2020-07-27 DIAGNOSIS — J45909 Unspecified asthma, uncomplicated: Secondary | ICD-10-CM | POA: Diagnosis not present

## 2020-07-27 DIAGNOSIS — Z905 Acquired absence of kidney: Secondary | ICD-10-CM | POA: Diagnosis not present

## 2020-07-27 DIAGNOSIS — C641 Malignant neoplasm of right kidney, except renal pelvis: Secondary | ICD-10-CM | POA: Diagnosis not present

## 2020-07-27 DIAGNOSIS — I4891 Unspecified atrial fibrillation: Secondary | ICD-10-CM | POA: Diagnosis not present

## 2020-07-27 DIAGNOSIS — E1122 Type 2 diabetes mellitus with diabetic chronic kidney disease: Secondary | ICD-10-CM | POA: Diagnosis not present

## 2020-07-27 DIAGNOSIS — E46 Unspecified protein-calorie malnutrition: Secondary | ICD-10-CM | POA: Diagnosis not present

## 2020-07-27 DIAGNOSIS — Z7951 Long term (current) use of inhaled steroids: Secondary | ICD-10-CM | POA: Diagnosis not present

## 2020-07-27 DIAGNOSIS — D631 Anemia in chronic kidney disease: Secondary | ICD-10-CM | POA: Diagnosis not present

## 2020-07-27 DIAGNOSIS — I509 Heart failure, unspecified: Secondary | ICD-10-CM | POA: Diagnosis not present

## 2020-07-27 DIAGNOSIS — I9589 Other hypotension: Secondary | ICD-10-CM | POA: Diagnosis not present

## 2020-07-27 DIAGNOSIS — N186 End stage renal disease: Secondary | ICD-10-CM | POA: Diagnosis not present

## 2020-07-27 DIAGNOSIS — N99840 Postprocedural hematoma of a genitourinary system organ or structure following a genitourinary system procedure: Secondary | ICD-10-CM | POA: Diagnosis not present

## 2020-07-27 DIAGNOSIS — J690 Pneumonitis due to inhalation of food and vomit: Secondary | ICD-10-CM | POA: Diagnosis not present

## 2020-07-28 DIAGNOSIS — I9589 Other hypotension: Secondary | ICD-10-CM | POA: Diagnosis not present

## 2020-07-28 DIAGNOSIS — N99840 Postprocedural hematoma of a genitourinary system organ or structure following a genitourinary system procedure: Secondary | ICD-10-CM | POA: Diagnosis not present

## 2020-07-28 DIAGNOSIS — C641 Malignant neoplasm of right kidney, except renal pelvis: Secondary | ICD-10-CM | POA: Diagnosis not present

## 2020-07-28 DIAGNOSIS — I132 Hypertensive heart and chronic kidney disease with heart failure and with stage 5 chronic kidney disease, or end stage renal disease: Secondary | ICD-10-CM | POA: Diagnosis not present

## 2020-07-28 DIAGNOSIS — I2589 Other forms of chronic ischemic heart disease: Secondary | ICD-10-CM | POA: Diagnosis not present

## 2020-07-28 DIAGNOSIS — D630 Anemia in neoplastic disease: Secondary | ICD-10-CM | POA: Diagnosis not present

## 2020-07-29 DIAGNOSIS — N2581 Secondary hyperparathyroidism of renal origin: Secondary | ICD-10-CM | POA: Diagnosis not present

## 2020-07-29 DIAGNOSIS — D509 Iron deficiency anemia, unspecified: Secondary | ICD-10-CM | POA: Diagnosis not present

## 2020-07-29 DIAGNOSIS — N186 End stage renal disease: Secondary | ICD-10-CM | POA: Diagnosis not present

## 2020-07-29 DIAGNOSIS — Z992 Dependence on renal dialysis: Secondary | ICD-10-CM | POA: Diagnosis not present

## 2020-07-29 DIAGNOSIS — D631 Anemia in chronic kidney disease: Secondary | ICD-10-CM | POA: Diagnosis not present

## 2020-07-29 DIAGNOSIS — Z23 Encounter for immunization: Secondary | ICD-10-CM | POA: Diagnosis not present

## 2020-07-31 DIAGNOSIS — Z992 Dependence on renal dialysis: Secondary | ICD-10-CM | POA: Diagnosis not present

## 2020-07-31 DIAGNOSIS — D509 Iron deficiency anemia, unspecified: Secondary | ICD-10-CM | POA: Diagnosis not present

## 2020-07-31 DIAGNOSIS — N2581 Secondary hyperparathyroidism of renal origin: Secondary | ICD-10-CM | POA: Diagnosis not present

## 2020-07-31 DIAGNOSIS — N186 End stage renal disease: Secondary | ICD-10-CM | POA: Diagnosis not present

## 2020-07-31 DIAGNOSIS — D631 Anemia in chronic kidney disease: Secondary | ICD-10-CM | POA: Diagnosis not present

## 2020-07-31 DIAGNOSIS — Z23 Encounter for immunization: Secondary | ICD-10-CM | POA: Diagnosis not present

## 2020-08-01 DIAGNOSIS — N99840 Postprocedural hematoma of a genitourinary system organ or structure following a genitourinary system procedure: Secondary | ICD-10-CM | POA: Diagnosis not present

## 2020-08-01 DIAGNOSIS — I132 Hypertensive heart and chronic kidney disease with heart failure and with stage 5 chronic kidney disease, or end stage renal disease: Secondary | ICD-10-CM | POA: Diagnosis not present

## 2020-08-01 DIAGNOSIS — C641 Malignant neoplasm of right kidney, except renal pelvis: Secondary | ICD-10-CM | POA: Diagnosis not present

## 2020-08-01 DIAGNOSIS — I9589 Other hypotension: Secondary | ICD-10-CM | POA: Diagnosis not present

## 2020-08-01 DIAGNOSIS — D630 Anemia in neoplastic disease: Secondary | ICD-10-CM | POA: Diagnosis not present

## 2020-08-01 DIAGNOSIS — I2589 Other forms of chronic ischemic heart disease: Secondary | ICD-10-CM | POA: Diagnosis not present

## 2020-08-02 DIAGNOSIS — Z992 Dependence on renal dialysis: Secondary | ICD-10-CM | POA: Diagnosis not present

## 2020-08-02 DIAGNOSIS — D631 Anemia in chronic kidney disease: Secondary | ICD-10-CM | POA: Diagnosis not present

## 2020-08-02 DIAGNOSIS — Z23 Encounter for immunization: Secondary | ICD-10-CM | POA: Diagnosis not present

## 2020-08-02 DIAGNOSIS — N186 End stage renal disease: Secondary | ICD-10-CM | POA: Diagnosis not present

## 2020-08-02 DIAGNOSIS — D509 Iron deficiency anemia, unspecified: Secondary | ICD-10-CM | POA: Diagnosis not present

## 2020-08-02 DIAGNOSIS — N2581 Secondary hyperparathyroidism of renal origin: Secondary | ICD-10-CM | POA: Diagnosis not present

## 2020-08-04 DIAGNOSIS — I132 Hypertensive heart and chronic kidney disease with heart failure and with stage 5 chronic kidney disease, or end stage renal disease: Secondary | ICD-10-CM | POA: Diagnosis not present

## 2020-08-04 DIAGNOSIS — C641 Malignant neoplasm of right kidney, except renal pelvis: Secondary | ICD-10-CM | POA: Diagnosis not present

## 2020-08-04 DIAGNOSIS — D630 Anemia in neoplastic disease: Secondary | ICD-10-CM | POA: Diagnosis not present

## 2020-08-04 DIAGNOSIS — I9589 Other hypotension: Secondary | ICD-10-CM | POA: Diagnosis not present

## 2020-08-04 DIAGNOSIS — I2589 Other forms of chronic ischemic heart disease: Secondary | ICD-10-CM | POA: Diagnosis not present

## 2020-08-04 DIAGNOSIS — N99840 Postprocedural hematoma of a genitourinary system organ or structure following a genitourinary system procedure: Secondary | ICD-10-CM | POA: Diagnosis not present

## 2020-08-04 DIAGNOSIS — Z452 Encounter for adjustment and management of vascular access device: Secondary | ICD-10-CM | POA: Diagnosis not present

## 2020-08-05 DIAGNOSIS — N99840 Postprocedural hematoma of a genitourinary system organ or structure following a genitourinary system procedure: Secondary | ICD-10-CM | POA: Diagnosis not present

## 2020-08-05 DIAGNOSIS — D630 Anemia in neoplastic disease: Secondary | ICD-10-CM | POA: Diagnosis not present

## 2020-08-05 DIAGNOSIS — I9589 Other hypotension: Secondary | ICD-10-CM | POA: Diagnosis not present

## 2020-08-05 DIAGNOSIS — Z23 Encounter for immunization: Secondary | ICD-10-CM | POA: Diagnosis not present

## 2020-08-05 DIAGNOSIS — N186 End stage renal disease: Secondary | ICD-10-CM | POA: Diagnosis not present

## 2020-08-05 DIAGNOSIS — Z992 Dependence on renal dialysis: Secondary | ICD-10-CM | POA: Diagnosis not present

## 2020-08-05 DIAGNOSIS — C641 Malignant neoplasm of right kidney, except renal pelvis: Secondary | ICD-10-CM | POA: Diagnosis not present

## 2020-08-05 DIAGNOSIS — D631 Anemia in chronic kidney disease: Secondary | ICD-10-CM | POA: Diagnosis not present

## 2020-08-05 DIAGNOSIS — I132 Hypertensive heart and chronic kidney disease with heart failure and with stage 5 chronic kidney disease, or end stage renal disease: Secondary | ICD-10-CM | POA: Diagnosis not present

## 2020-08-05 DIAGNOSIS — N2581 Secondary hyperparathyroidism of renal origin: Secondary | ICD-10-CM | POA: Diagnosis not present

## 2020-08-05 DIAGNOSIS — I2589 Other forms of chronic ischemic heart disease: Secondary | ICD-10-CM | POA: Diagnosis not present

## 2020-08-05 DIAGNOSIS — D509 Iron deficiency anemia, unspecified: Secondary | ICD-10-CM | POA: Diagnosis not present

## 2020-08-07 DIAGNOSIS — E7800 Pure hypercholesterolemia, unspecified: Secondary | ICD-10-CM | POA: Diagnosis not present

## 2020-08-07 DIAGNOSIS — D509 Iron deficiency anemia, unspecified: Secondary | ICD-10-CM | POA: Diagnosis not present

## 2020-08-07 DIAGNOSIS — I10 Essential (primary) hypertension: Secondary | ICD-10-CM | POA: Diagnosis not present

## 2020-08-07 DIAGNOSIS — Z23 Encounter for immunization: Secondary | ICD-10-CM | POA: Diagnosis not present

## 2020-08-07 DIAGNOSIS — N2581 Secondary hyperparathyroidism of renal origin: Secondary | ICD-10-CM | POA: Diagnosis not present

## 2020-08-07 DIAGNOSIS — Z992 Dependence on renal dialysis: Secondary | ICD-10-CM | POA: Diagnosis not present

## 2020-08-07 DIAGNOSIS — D631 Anemia in chronic kidney disease: Secondary | ICD-10-CM | POA: Diagnosis not present

## 2020-08-07 DIAGNOSIS — N186 End stage renal disease: Secondary | ICD-10-CM | POA: Diagnosis not present

## 2020-08-07 DIAGNOSIS — M159 Polyosteoarthritis, unspecified: Secondary | ICD-10-CM | POA: Diagnosis not present

## 2020-08-08 DIAGNOSIS — I132 Hypertensive heart and chronic kidney disease with heart failure and with stage 5 chronic kidney disease, or end stage renal disease: Secondary | ICD-10-CM | POA: Diagnosis not present

## 2020-08-08 DIAGNOSIS — C641 Malignant neoplasm of right kidney, except renal pelvis: Secondary | ICD-10-CM | POA: Diagnosis not present

## 2020-08-08 DIAGNOSIS — I2589 Other forms of chronic ischemic heart disease: Secondary | ICD-10-CM | POA: Diagnosis not present

## 2020-08-08 DIAGNOSIS — D630 Anemia in neoplastic disease: Secondary | ICD-10-CM | POA: Diagnosis not present

## 2020-08-08 DIAGNOSIS — I9589 Other hypotension: Secondary | ICD-10-CM | POA: Diagnosis not present

## 2020-08-08 DIAGNOSIS — N99840 Postprocedural hematoma of a genitourinary system organ or structure following a genitourinary system procedure: Secondary | ICD-10-CM | POA: Diagnosis not present

## 2020-08-09 DIAGNOSIS — D509 Iron deficiency anemia, unspecified: Secondary | ICD-10-CM | POA: Diagnosis not present

## 2020-08-09 DIAGNOSIS — Z992 Dependence on renal dialysis: Secondary | ICD-10-CM | POA: Diagnosis not present

## 2020-08-09 DIAGNOSIS — N2581 Secondary hyperparathyroidism of renal origin: Secondary | ICD-10-CM | POA: Diagnosis not present

## 2020-08-09 DIAGNOSIS — N186 End stage renal disease: Secondary | ICD-10-CM | POA: Diagnosis not present

## 2020-08-09 DIAGNOSIS — D631 Anemia in chronic kidney disease: Secondary | ICD-10-CM | POA: Diagnosis not present

## 2020-08-09 DIAGNOSIS — Z23 Encounter for immunization: Secondary | ICD-10-CM | POA: Diagnosis not present

## 2020-08-12 DIAGNOSIS — D631 Anemia in chronic kidney disease: Secondary | ICD-10-CM | POA: Diagnosis not present

## 2020-08-12 DIAGNOSIS — N186 End stage renal disease: Secondary | ICD-10-CM | POA: Diagnosis not present

## 2020-08-12 DIAGNOSIS — Z992 Dependence on renal dialysis: Secondary | ICD-10-CM | POA: Diagnosis not present

## 2020-08-12 DIAGNOSIS — Z23 Encounter for immunization: Secondary | ICD-10-CM | POA: Diagnosis not present

## 2020-08-12 DIAGNOSIS — D509 Iron deficiency anemia, unspecified: Secondary | ICD-10-CM | POA: Diagnosis not present

## 2020-08-12 DIAGNOSIS — N2581 Secondary hyperparathyroidism of renal origin: Secondary | ICD-10-CM | POA: Diagnosis not present

## 2020-08-14 DIAGNOSIS — D631 Anemia in chronic kidney disease: Secondary | ICD-10-CM | POA: Diagnosis not present

## 2020-08-14 DIAGNOSIS — Z23 Encounter for immunization: Secondary | ICD-10-CM | POA: Diagnosis not present

## 2020-08-14 DIAGNOSIS — Z992 Dependence on renal dialysis: Secondary | ICD-10-CM | POA: Diagnosis not present

## 2020-08-14 DIAGNOSIS — N186 End stage renal disease: Secondary | ICD-10-CM | POA: Diagnosis not present

## 2020-08-14 DIAGNOSIS — N2581 Secondary hyperparathyroidism of renal origin: Secondary | ICD-10-CM | POA: Diagnosis not present

## 2020-08-14 DIAGNOSIS — D509 Iron deficiency anemia, unspecified: Secondary | ICD-10-CM | POA: Diagnosis not present

## 2020-08-15 DIAGNOSIS — Z299 Encounter for prophylactic measures, unspecified: Secondary | ICD-10-CM | POA: Diagnosis not present

## 2020-08-15 DIAGNOSIS — N186 End stage renal disease: Secondary | ICD-10-CM | POA: Diagnosis not present

## 2020-08-15 DIAGNOSIS — E1122 Type 2 diabetes mellitus with diabetic chronic kidney disease: Secondary | ICD-10-CM | POA: Diagnosis not present

## 2020-08-15 DIAGNOSIS — I10 Essential (primary) hypertension: Secondary | ICD-10-CM | POA: Diagnosis not present

## 2020-08-15 DIAGNOSIS — E1165 Type 2 diabetes mellitus with hyperglycemia: Secondary | ICD-10-CM | POA: Diagnosis not present

## 2020-08-15 DIAGNOSIS — Z6829 Body mass index (BMI) 29.0-29.9, adult: Secondary | ICD-10-CM | POA: Diagnosis not present

## 2020-08-16 DIAGNOSIS — D509 Iron deficiency anemia, unspecified: Secondary | ICD-10-CM | POA: Diagnosis not present

## 2020-08-16 DIAGNOSIS — N2581 Secondary hyperparathyroidism of renal origin: Secondary | ICD-10-CM | POA: Diagnosis not present

## 2020-08-16 DIAGNOSIS — N186 End stage renal disease: Secondary | ICD-10-CM | POA: Diagnosis not present

## 2020-08-16 DIAGNOSIS — Z992 Dependence on renal dialysis: Secondary | ICD-10-CM | POA: Diagnosis not present

## 2020-08-16 DIAGNOSIS — Z23 Encounter for immunization: Secondary | ICD-10-CM | POA: Diagnosis not present

## 2020-08-16 DIAGNOSIS — D631 Anemia in chronic kidney disease: Secondary | ICD-10-CM | POA: Diagnosis not present

## 2020-08-19 DIAGNOSIS — N186 End stage renal disease: Secondary | ICD-10-CM | POA: Diagnosis not present

## 2020-08-19 DIAGNOSIS — N2581 Secondary hyperparathyroidism of renal origin: Secondary | ICD-10-CM | POA: Diagnosis not present

## 2020-08-19 DIAGNOSIS — D631 Anemia in chronic kidney disease: Secondary | ICD-10-CM | POA: Diagnosis not present

## 2020-08-19 DIAGNOSIS — Z23 Encounter for immunization: Secondary | ICD-10-CM | POA: Diagnosis not present

## 2020-08-19 DIAGNOSIS — D509 Iron deficiency anemia, unspecified: Secondary | ICD-10-CM | POA: Diagnosis not present

## 2020-08-19 DIAGNOSIS — Z992 Dependence on renal dialysis: Secondary | ICD-10-CM | POA: Diagnosis not present

## 2020-08-21 DIAGNOSIS — D631 Anemia in chronic kidney disease: Secondary | ICD-10-CM | POA: Diagnosis not present

## 2020-08-21 DIAGNOSIS — D509 Iron deficiency anemia, unspecified: Secondary | ICD-10-CM | POA: Diagnosis not present

## 2020-08-21 DIAGNOSIS — Z992 Dependence on renal dialysis: Secondary | ICD-10-CM | POA: Diagnosis not present

## 2020-08-21 DIAGNOSIS — N186 End stage renal disease: Secondary | ICD-10-CM | POA: Diagnosis not present

## 2020-08-21 DIAGNOSIS — Z23 Encounter for immunization: Secondary | ICD-10-CM | POA: Diagnosis not present

## 2020-08-21 DIAGNOSIS — N2581 Secondary hyperparathyroidism of renal origin: Secondary | ICD-10-CM | POA: Diagnosis not present

## 2020-08-23 DIAGNOSIS — D631 Anemia in chronic kidney disease: Secondary | ICD-10-CM | POA: Diagnosis not present

## 2020-08-23 DIAGNOSIS — D509 Iron deficiency anemia, unspecified: Secondary | ICD-10-CM | POA: Diagnosis not present

## 2020-08-23 DIAGNOSIS — Z23 Encounter for immunization: Secondary | ICD-10-CM | POA: Diagnosis not present

## 2020-08-23 DIAGNOSIS — Z992 Dependence on renal dialysis: Secondary | ICD-10-CM | POA: Diagnosis not present

## 2020-08-23 DIAGNOSIS — N186 End stage renal disease: Secondary | ICD-10-CM | POA: Diagnosis not present

## 2020-08-23 DIAGNOSIS — N2581 Secondary hyperparathyroidism of renal origin: Secondary | ICD-10-CM | POA: Diagnosis not present

## 2020-08-25 DIAGNOSIS — B351 Tinea unguium: Secondary | ICD-10-CM | POA: Diagnosis not present

## 2020-08-25 DIAGNOSIS — E1140 Type 2 diabetes mellitus with diabetic neuropathy, unspecified: Secondary | ICD-10-CM | POA: Diagnosis not present

## 2020-08-25 DIAGNOSIS — E1151 Type 2 diabetes mellitus with diabetic peripheral angiopathy without gangrene: Secondary | ICD-10-CM | POA: Diagnosis not present

## 2020-08-25 DIAGNOSIS — L110 Acquired keratosis follicularis: Secondary | ICD-10-CM | POA: Diagnosis not present

## 2020-08-26 DIAGNOSIS — N186 End stage renal disease: Secondary | ICD-10-CM | POA: Diagnosis not present

## 2020-08-26 DIAGNOSIS — D509 Iron deficiency anemia, unspecified: Secondary | ICD-10-CM | POA: Diagnosis not present

## 2020-08-26 DIAGNOSIS — D631 Anemia in chronic kidney disease: Secondary | ICD-10-CM | POA: Diagnosis not present

## 2020-08-26 DIAGNOSIS — Z992 Dependence on renal dialysis: Secondary | ICD-10-CM | POA: Diagnosis not present

## 2020-08-26 DIAGNOSIS — Z23 Encounter for immunization: Secondary | ICD-10-CM | POA: Diagnosis not present

## 2020-08-26 DIAGNOSIS — N2581 Secondary hyperparathyroidism of renal origin: Secondary | ICD-10-CM | POA: Diagnosis not present

## 2020-08-28 DIAGNOSIS — Z23 Encounter for immunization: Secondary | ICD-10-CM | POA: Diagnosis not present

## 2020-08-28 DIAGNOSIS — D509 Iron deficiency anemia, unspecified: Secondary | ICD-10-CM | POA: Diagnosis not present

## 2020-08-28 DIAGNOSIS — Z992 Dependence on renal dialysis: Secondary | ICD-10-CM | POA: Diagnosis not present

## 2020-08-28 DIAGNOSIS — D631 Anemia in chronic kidney disease: Secondary | ICD-10-CM | POA: Diagnosis not present

## 2020-08-28 DIAGNOSIS — N186 End stage renal disease: Secondary | ICD-10-CM | POA: Diagnosis not present

## 2020-08-28 DIAGNOSIS — N2581 Secondary hyperparathyroidism of renal origin: Secondary | ICD-10-CM | POA: Diagnosis not present

## 2020-08-30 DIAGNOSIS — Z992 Dependence on renal dialysis: Secondary | ICD-10-CM | POA: Diagnosis not present

## 2020-08-30 DIAGNOSIS — N186 End stage renal disease: Secondary | ICD-10-CM | POA: Diagnosis not present

## 2020-08-30 DIAGNOSIS — D631 Anemia in chronic kidney disease: Secondary | ICD-10-CM | POA: Diagnosis not present

## 2020-08-30 DIAGNOSIS — Z23 Encounter for immunization: Secondary | ICD-10-CM | POA: Diagnosis not present

## 2020-08-30 DIAGNOSIS — D509 Iron deficiency anemia, unspecified: Secondary | ICD-10-CM | POA: Diagnosis not present

## 2020-08-30 DIAGNOSIS — N2581 Secondary hyperparathyroidism of renal origin: Secondary | ICD-10-CM | POA: Diagnosis not present

## 2020-09-02 DIAGNOSIS — Z23 Encounter for immunization: Secondary | ICD-10-CM | POA: Diagnosis not present

## 2020-09-02 DIAGNOSIS — D509 Iron deficiency anemia, unspecified: Secondary | ICD-10-CM | POA: Diagnosis not present

## 2020-09-02 DIAGNOSIS — Z992 Dependence on renal dialysis: Secondary | ICD-10-CM | POA: Diagnosis not present

## 2020-09-02 DIAGNOSIS — N186 End stage renal disease: Secondary | ICD-10-CM | POA: Diagnosis not present

## 2020-09-02 DIAGNOSIS — N2581 Secondary hyperparathyroidism of renal origin: Secondary | ICD-10-CM | POA: Diagnosis not present

## 2020-09-02 DIAGNOSIS — D631 Anemia in chronic kidney disease: Secondary | ICD-10-CM | POA: Diagnosis not present

## 2020-09-04 DIAGNOSIS — D509 Iron deficiency anemia, unspecified: Secondary | ICD-10-CM | POA: Diagnosis not present

## 2020-09-04 DIAGNOSIS — Z992 Dependence on renal dialysis: Secondary | ICD-10-CM | POA: Diagnosis not present

## 2020-09-04 DIAGNOSIS — D631 Anemia in chronic kidney disease: Secondary | ICD-10-CM | POA: Diagnosis not present

## 2020-09-04 DIAGNOSIS — N186 End stage renal disease: Secondary | ICD-10-CM | POA: Diagnosis not present

## 2020-09-04 DIAGNOSIS — Z23 Encounter for immunization: Secondary | ICD-10-CM | POA: Diagnosis not present

## 2020-09-04 DIAGNOSIS — N2581 Secondary hyperparathyroidism of renal origin: Secondary | ICD-10-CM | POA: Diagnosis not present

## 2020-09-05 DIAGNOSIS — E7800 Pure hypercholesterolemia, unspecified: Secondary | ICD-10-CM | POA: Diagnosis not present

## 2020-09-05 DIAGNOSIS — I10 Essential (primary) hypertension: Secondary | ICD-10-CM | POA: Diagnosis not present

## 2020-09-05 DIAGNOSIS — M159 Polyosteoarthritis, unspecified: Secondary | ICD-10-CM | POA: Diagnosis not present

## 2020-09-06 DIAGNOSIS — N2581 Secondary hyperparathyroidism of renal origin: Secondary | ICD-10-CM | POA: Diagnosis not present

## 2020-09-06 DIAGNOSIS — Z992 Dependence on renal dialysis: Secondary | ICD-10-CM | POA: Diagnosis not present

## 2020-09-06 DIAGNOSIS — Z23 Encounter for immunization: Secondary | ICD-10-CM | POA: Diagnosis not present

## 2020-09-06 DIAGNOSIS — D509 Iron deficiency anemia, unspecified: Secondary | ICD-10-CM | POA: Diagnosis not present

## 2020-09-06 DIAGNOSIS — D631 Anemia in chronic kidney disease: Secondary | ICD-10-CM | POA: Diagnosis not present

## 2020-09-06 DIAGNOSIS — N186 End stage renal disease: Secondary | ICD-10-CM | POA: Diagnosis not present

## 2020-09-07 DIAGNOSIS — N186 End stage renal disease: Secondary | ICD-10-CM | POA: Diagnosis not present

## 2020-09-07 DIAGNOSIS — Z992 Dependence on renal dialysis: Secondary | ICD-10-CM | POA: Diagnosis not present

## 2020-09-09 DIAGNOSIS — Z992 Dependence on renal dialysis: Secondary | ICD-10-CM | POA: Diagnosis not present

## 2020-09-09 DIAGNOSIS — D631 Anemia in chronic kidney disease: Secondary | ICD-10-CM | POA: Diagnosis not present

## 2020-09-09 DIAGNOSIS — N186 End stage renal disease: Secondary | ICD-10-CM | POA: Diagnosis not present

## 2020-09-09 DIAGNOSIS — Z23 Encounter for immunization: Secondary | ICD-10-CM | POA: Diagnosis not present

## 2020-09-09 DIAGNOSIS — D509 Iron deficiency anemia, unspecified: Secondary | ICD-10-CM | POA: Diagnosis not present

## 2020-09-11 DIAGNOSIS — N186 End stage renal disease: Secondary | ICD-10-CM | POA: Diagnosis not present

## 2020-09-11 DIAGNOSIS — Z23 Encounter for immunization: Secondary | ICD-10-CM | POA: Diagnosis not present

## 2020-09-11 DIAGNOSIS — Z992 Dependence on renal dialysis: Secondary | ICD-10-CM | POA: Diagnosis not present

## 2020-09-11 DIAGNOSIS — D631 Anemia in chronic kidney disease: Secondary | ICD-10-CM | POA: Diagnosis not present

## 2020-09-11 DIAGNOSIS — D509 Iron deficiency anemia, unspecified: Secondary | ICD-10-CM | POA: Diagnosis not present

## 2020-09-13 DIAGNOSIS — Z992 Dependence on renal dialysis: Secondary | ICD-10-CM | POA: Diagnosis not present

## 2020-09-13 DIAGNOSIS — D509 Iron deficiency anemia, unspecified: Secondary | ICD-10-CM | POA: Diagnosis not present

## 2020-09-13 DIAGNOSIS — Z23 Encounter for immunization: Secondary | ICD-10-CM | POA: Diagnosis not present

## 2020-09-13 DIAGNOSIS — D631 Anemia in chronic kidney disease: Secondary | ICD-10-CM | POA: Diagnosis not present

## 2020-09-13 DIAGNOSIS — N186 End stage renal disease: Secondary | ICD-10-CM | POA: Diagnosis not present

## 2020-09-16 DIAGNOSIS — D631 Anemia in chronic kidney disease: Secondary | ICD-10-CM | POA: Diagnosis not present

## 2020-09-16 DIAGNOSIS — Z992 Dependence on renal dialysis: Secondary | ICD-10-CM | POA: Diagnosis not present

## 2020-09-16 DIAGNOSIS — N186 End stage renal disease: Secondary | ICD-10-CM | POA: Diagnosis not present

## 2020-09-16 DIAGNOSIS — Z23 Encounter for immunization: Secondary | ICD-10-CM | POA: Diagnosis not present

## 2020-09-16 DIAGNOSIS — D509 Iron deficiency anemia, unspecified: Secondary | ICD-10-CM | POA: Diagnosis not present

## 2020-09-18 DIAGNOSIS — N186 End stage renal disease: Secondary | ICD-10-CM | POA: Diagnosis not present

## 2020-09-18 DIAGNOSIS — Z992 Dependence on renal dialysis: Secondary | ICD-10-CM | POA: Diagnosis not present

## 2020-09-18 DIAGNOSIS — D509 Iron deficiency anemia, unspecified: Secondary | ICD-10-CM | POA: Diagnosis not present

## 2020-09-18 DIAGNOSIS — D631 Anemia in chronic kidney disease: Secondary | ICD-10-CM | POA: Diagnosis not present

## 2020-09-18 DIAGNOSIS — Z23 Encounter for immunization: Secondary | ICD-10-CM | POA: Diagnosis not present

## 2020-09-20 DIAGNOSIS — D509 Iron deficiency anemia, unspecified: Secondary | ICD-10-CM | POA: Diagnosis not present

## 2020-09-20 DIAGNOSIS — N186 End stage renal disease: Secondary | ICD-10-CM | POA: Diagnosis not present

## 2020-09-20 DIAGNOSIS — D631 Anemia in chronic kidney disease: Secondary | ICD-10-CM | POA: Diagnosis not present

## 2020-09-20 DIAGNOSIS — Z23 Encounter for immunization: Secondary | ICD-10-CM | POA: Diagnosis not present

## 2020-09-20 DIAGNOSIS — Z992 Dependence on renal dialysis: Secondary | ICD-10-CM | POA: Diagnosis not present

## 2020-09-23 DIAGNOSIS — D631 Anemia in chronic kidney disease: Secondary | ICD-10-CM | POA: Diagnosis not present

## 2020-09-23 DIAGNOSIS — Z23 Encounter for immunization: Secondary | ICD-10-CM | POA: Diagnosis not present

## 2020-09-23 DIAGNOSIS — Z992 Dependence on renal dialysis: Secondary | ICD-10-CM | POA: Diagnosis not present

## 2020-09-23 DIAGNOSIS — N186 End stage renal disease: Secondary | ICD-10-CM | POA: Diagnosis not present

## 2020-09-23 DIAGNOSIS — D509 Iron deficiency anemia, unspecified: Secondary | ICD-10-CM | POA: Diagnosis not present

## 2020-09-25 DIAGNOSIS — Z992 Dependence on renal dialysis: Secondary | ICD-10-CM | POA: Diagnosis not present

## 2020-09-25 DIAGNOSIS — D509 Iron deficiency anemia, unspecified: Secondary | ICD-10-CM | POA: Diagnosis not present

## 2020-09-25 DIAGNOSIS — D631 Anemia in chronic kidney disease: Secondary | ICD-10-CM | POA: Diagnosis not present

## 2020-09-25 DIAGNOSIS — Z23 Encounter for immunization: Secondary | ICD-10-CM | POA: Diagnosis not present

## 2020-09-25 DIAGNOSIS — N186 End stage renal disease: Secondary | ICD-10-CM | POA: Diagnosis not present

## 2020-09-27 DIAGNOSIS — Z23 Encounter for immunization: Secondary | ICD-10-CM | POA: Diagnosis not present

## 2020-09-27 DIAGNOSIS — D631 Anemia in chronic kidney disease: Secondary | ICD-10-CM | POA: Diagnosis not present

## 2020-09-27 DIAGNOSIS — D509 Iron deficiency anemia, unspecified: Secondary | ICD-10-CM | POA: Diagnosis not present

## 2020-09-27 DIAGNOSIS — Z992 Dependence on renal dialysis: Secondary | ICD-10-CM | POA: Diagnosis not present

## 2020-09-27 DIAGNOSIS — N186 End stage renal disease: Secondary | ICD-10-CM | POA: Diagnosis not present

## 2020-09-30 DIAGNOSIS — D631 Anemia in chronic kidney disease: Secondary | ICD-10-CM | POA: Diagnosis not present

## 2020-09-30 DIAGNOSIS — Z23 Encounter for immunization: Secondary | ICD-10-CM | POA: Diagnosis not present

## 2020-09-30 DIAGNOSIS — Z992 Dependence on renal dialysis: Secondary | ICD-10-CM | POA: Diagnosis not present

## 2020-09-30 DIAGNOSIS — N186 End stage renal disease: Secondary | ICD-10-CM | POA: Diagnosis not present

## 2020-09-30 DIAGNOSIS — D509 Iron deficiency anemia, unspecified: Secondary | ICD-10-CM | POA: Diagnosis not present

## 2020-10-02 DIAGNOSIS — Z992 Dependence on renal dialysis: Secondary | ICD-10-CM | POA: Diagnosis not present

## 2020-10-02 DIAGNOSIS — D631 Anemia in chronic kidney disease: Secondary | ICD-10-CM | POA: Diagnosis not present

## 2020-10-02 DIAGNOSIS — N186 End stage renal disease: Secondary | ICD-10-CM | POA: Diagnosis not present

## 2020-10-02 DIAGNOSIS — D509 Iron deficiency anemia, unspecified: Secondary | ICD-10-CM | POA: Diagnosis not present

## 2020-10-02 DIAGNOSIS — Z23 Encounter for immunization: Secondary | ICD-10-CM | POA: Diagnosis not present

## 2020-10-04 DIAGNOSIS — N186 End stage renal disease: Secondary | ICD-10-CM | POA: Diagnosis not present

## 2020-10-04 DIAGNOSIS — D631 Anemia in chronic kidney disease: Secondary | ICD-10-CM | POA: Diagnosis not present

## 2020-10-04 DIAGNOSIS — Z23 Encounter for immunization: Secondary | ICD-10-CM | POA: Diagnosis not present

## 2020-10-04 DIAGNOSIS — Z992 Dependence on renal dialysis: Secondary | ICD-10-CM | POA: Diagnosis not present

## 2020-10-04 DIAGNOSIS — D509 Iron deficiency anemia, unspecified: Secondary | ICD-10-CM | POA: Diagnosis not present

## 2020-10-07 DIAGNOSIS — D631 Anemia in chronic kidney disease: Secondary | ICD-10-CM | POA: Diagnosis not present

## 2020-10-07 DIAGNOSIS — M159 Polyosteoarthritis, unspecified: Secondary | ICD-10-CM | POA: Diagnosis not present

## 2020-10-07 DIAGNOSIS — Z23 Encounter for immunization: Secondary | ICD-10-CM | POA: Diagnosis not present

## 2020-10-07 DIAGNOSIS — N186 End stage renal disease: Secondary | ICD-10-CM | POA: Diagnosis not present

## 2020-10-07 DIAGNOSIS — D509 Iron deficiency anemia, unspecified: Secondary | ICD-10-CM | POA: Diagnosis not present

## 2020-10-07 DIAGNOSIS — E7800 Pure hypercholesterolemia, unspecified: Secondary | ICD-10-CM | POA: Diagnosis not present

## 2020-10-07 DIAGNOSIS — I10 Essential (primary) hypertension: Secondary | ICD-10-CM | POA: Diagnosis not present

## 2020-10-07 DIAGNOSIS — Z992 Dependence on renal dialysis: Secondary | ICD-10-CM | POA: Diagnosis not present

## 2020-10-09 DIAGNOSIS — Z992 Dependence on renal dialysis: Secondary | ICD-10-CM | POA: Diagnosis not present

## 2020-10-09 DIAGNOSIS — D631 Anemia in chronic kidney disease: Secondary | ICD-10-CM | POA: Diagnosis not present

## 2020-10-09 DIAGNOSIS — N2581 Secondary hyperparathyroidism of renal origin: Secondary | ICD-10-CM | POA: Diagnosis not present

## 2020-10-09 DIAGNOSIS — D509 Iron deficiency anemia, unspecified: Secondary | ICD-10-CM | POA: Diagnosis not present

## 2020-10-09 DIAGNOSIS — N186 End stage renal disease: Secondary | ICD-10-CM | POA: Diagnosis not present

## 2020-10-09 DIAGNOSIS — Z23 Encounter for immunization: Secondary | ICD-10-CM | POA: Diagnosis not present

## 2020-10-11 DIAGNOSIS — D509 Iron deficiency anemia, unspecified: Secondary | ICD-10-CM | POA: Diagnosis not present

## 2020-10-11 DIAGNOSIS — Z23 Encounter for immunization: Secondary | ICD-10-CM | POA: Diagnosis not present

## 2020-10-11 DIAGNOSIS — N2581 Secondary hyperparathyroidism of renal origin: Secondary | ICD-10-CM | POA: Diagnosis not present

## 2020-10-11 DIAGNOSIS — D631 Anemia in chronic kidney disease: Secondary | ICD-10-CM | POA: Diagnosis not present

## 2020-10-11 DIAGNOSIS — Z992 Dependence on renal dialysis: Secondary | ICD-10-CM | POA: Diagnosis not present

## 2020-10-11 DIAGNOSIS — N186 End stage renal disease: Secondary | ICD-10-CM | POA: Diagnosis not present

## 2020-10-14 DIAGNOSIS — N186 End stage renal disease: Secondary | ICD-10-CM | POA: Diagnosis not present

## 2020-10-14 DIAGNOSIS — D631 Anemia in chronic kidney disease: Secondary | ICD-10-CM | POA: Diagnosis not present

## 2020-10-14 DIAGNOSIS — Z23 Encounter for immunization: Secondary | ICD-10-CM | POA: Diagnosis not present

## 2020-10-14 DIAGNOSIS — D509 Iron deficiency anemia, unspecified: Secondary | ICD-10-CM | POA: Diagnosis not present

## 2020-10-14 DIAGNOSIS — Z992 Dependence on renal dialysis: Secondary | ICD-10-CM | POA: Diagnosis not present

## 2020-10-14 DIAGNOSIS — N2581 Secondary hyperparathyroidism of renal origin: Secondary | ICD-10-CM | POA: Diagnosis not present

## 2020-10-16 DIAGNOSIS — Z992 Dependence on renal dialysis: Secondary | ICD-10-CM | POA: Diagnosis not present

## 2020-10-16 DIAGNOSIS — D631 Anemia in chronic kidney disease: Secondary | ICD-10-CM | POA: Diagnosis not present

## 2020-10-16 DIAGNOSIS — N2581 Secondary hyperparathyroidism of renal origin: Secondary | ICD-10-CM | POA: Diagnosis not present

## 2020-10-16 DIAGNOSIS — Z23 Encounter for immunization: Secondary | ICD-10-CM | POA: Diagnosis not present

## 2020-10-16 DIAGNOSIS — D509 Iron deficiency anemia, unspecified: Secondary | ICD-10-CM | POA: Diagnosis not present

## 2020-10-16 DIAGNOSIS — N186 End stage renal disease: Secondary | ICD-10-CM | POA: Diagnosis not present

## 2020-10-18 DIAGNOSIS — N186 End stage renal disease: Secondary | ICD-10-CM | POA: Diagnosis not present

## 2020-10-18 DIAGNOSIS — D509 Iron deficiency anemia, unspecified: Secondary | ICD-10-CM | POA: Diagnosis not present

## 2020-10-18 DIAGNOSIS — Z23 Encounter for immunization: Secondary | ICD-10-CM | POA: Diagnosis not present

## 2020-10-18 DIAGNOSIS — Z992 Dependence on renal dialysis: Secondary | ICD-10-CM | POA: Diagnosis not present

## 2020-10-18 DIAGNOSIS — N2581 Secondary hyperparathyroidism of renal origin: Secondary | ICD-10-CM | POA: Diagnosis not present

## 2020-10-18 DIAGNOSIS — D631 Anemia in chronic kidney disease: Secondary | ICD-10-CM | POA: Diagnosis not present

## 2020-10-20 DIAGNOSIS — N2581 Secondary hyperparathyroidism of renal origin: Secondary | ICD-10-CM | POA: Diagnosis not present

## 2020-10-20 DIAGNOSIS — H35033 Hypertensive retinopathy, bilateral: Secondary | ICD-10-CM | POA: Diagnosis not present

## 2020-10-20 DIAGNOSIS — Z992 Dependence on renal dialysis: Secondary | ICD-10-CM | POA: Diagnosis not present

## 2020-10-20 DIAGNOSIS — Z23 Encounter for immunization: Secondary | ICD-10-CM | POA: Diagnosis not present

## 2020-10-20 DIAGNOSIS — D631 Anemia in chronic kidney disease: Secondary | ICD-10-CM | POA: Diagnosis not present

## 2020-10-20 DIAGNOSIS — N186 End stage renal disease: Secondary | ICD-10-CM | POA: Diagnosis not present

## 2020-10-20 DIAGNOSIS — D509 Iron deficiency anemia, unspecified: Secondary | ICD-10-CM | POA: Diagnosis not present

## 2020-10-21 DIAGNOSIS — M545 Low back pain, unspecified: Secondary | ICD-10-CM | POA: Diagnosis not present

## 2020-10-21 DIAGNOSIS — M160 Bilateral primary osteoarthritis of hip: Secondary | ICD-10-CM | POA: Diagnosis not present

## 2020-10-21 DIAGNOSIS — M5136 Other intervertebral disc degeneration, lumbar region: Secondary | ICD-10-CM | POA: Diagnosis not present

## 2020-10-21 DIAGNOSIS — E7800 Pure hypercholesterolemia, unspecified: Secondary | ICD-10-CM | POA: Diagnosis not present

## 2020-10-21 DIAGNOSIS — I10 Essential (primary) hypertension: Secondary | ICD-10-CM | POA: Diagnosis not present

## 2020-10-21 DIAGNOSIS — J449 Chronic obstructive pulmonary disease, unspecified: Secondary | ICD-10-CM | POA: Diagnosis not present

## 2020-10-21 DIAGNOSIS — M4316 Spondylolisthesis, lumbar region: Secondary | ICD-10-CM | POA: Diagnosis not present

## 2020-10-21 DIAGNOSIS — Z299 Encounter for prophylactic measures, unspecified: Secondary | ICD-10-CM | POA: Diagnosis not present

## 2020-10-22 DIAGNOSIS — D509 Iron deficiency anemia, unspecified: Secondary | ICD-10-CM | POA: Diagnosis not present

## 2020-10-22 DIAGNOSIS — N2581 Secondary hyperparathyroidism of renal origin: Secondary | ICD-10-CM | POA: Diagnosis not present

## 2020-10-22 DIAGNOSIS — Z23 Encounter for immunization: Secondary | ICD-10-CM | POA: Diagnosis not present

## 2020-10-22 DIAGNOSIS — N186 End stage renal disease: Secondary | ICD-10-CM | POA: Diagnosis not present

## 2020-10-22 DIAGNOSIS — D631 Anemia in chronic kidney disease: Secondary | ICD-10-CM | POA: Diagnosis not present

## 2020-10-22 DIAGNOSIS — Z992 Dependence on renal dialysis: Secondary | ICD-10-CM | POA: Diagnosis not present

## 2020-10-24 DIAGNOSIS — D509 Iron deficiency anemia, unspecified: Secondary | ICD-10-CM | POA: Diagnosis not present

## 2020-10-24 DIAGNOSIS — Z23 Encounter for immunization: Secondary | ICD-10-CM | POA: Diagnosis not present

## 2020-10-24 DIAGNOSIS — N2581 Secondary hyperparathyroidism of renal origin: Secondary | ICD-10-CM | POA: Diagnosis not present

## 2020-10-24 DIAGNOSIS — N186 End stage renal disease: Secondary | ICD-10-CM | POA: Diagnosis not present

## 2020-10-24 DIAGNOSIS — D631 Anemia in chronic kidney disease: Secondary | ICD-10-CM | POA: Diagnosis not present

## 2020-10-24 DIAGNOSIS — Z992 Dependence on renal dialysis: Secondary | ICD-10-CM | POA: Diagnosis not present

## 2020-10-27 DIAGNOSIS — D509 Iron deficiency anemia, unspecified: Secondary | ICD-10-CM | POA: Diagnosis not present

## 2020-10-27 DIAGNOSIS — Z992 Dependence on renal dialysis: Secondary | ICD-10-CM | POA: Diagnosis not present

## 2020-10-27 DIAGNOSIS — M5432 Sciatica, left side: Secondary | ICD-10-CM | POA: Diagnosis not present

## 2020-10-27 DIAGNOSIS — K5909 Other constipation: Secondary | ICD-10-CM | POA: Diagnosis not present

## 2020-10-27 DIAGNOSIS — M1710 Unilateral primary osteoarthritis, unspecified knee: Secondary | ICD-10-CM | POA: Diagnosis not present

## 2020-10-27 DIAGNOSIS — D631 Anemia in chronic kidney disease: Secondary | ICD-10-CM | POA: Diagnosis not present

## 2020-10-27 DIAGNOSIS — N2581 Secondary hyperparathyroidism of renal origin: Secondary | ICD-10-CM | POA: Diagnosis not present

## 2020-10-27 DIAGNOSIS — Z789 Other specified health status: Secondary | ICD-10-CM | POA: Diagnosis not present

## 2020-10-27 DIAGNOSIS — I10 Essential (primary) hypertension: Secondary | ICD-10-CM | POA: Diagnosis not present

## 2020-10-27 DIAGNOSIS — N186 End stage renal disease: Secondary | ICD-10-CM | POA: Diagnosis not present

## 2020-10-27 DIAGNOSIS — Z299 Encounter for prophylactic measures, unspecified: Secondary | ICD-10-CM | POA: Diagnosis not present

## 2020-10-27 DIAGNOSIS — Z23 Encounter for immunization: Secondary | ICD-10-CM | POA: Diagnosis not present

## 2020-10-29 DIAGNOSIS — N2581 Secondary hyperparathyroidism of renal origin: Secondary | ICD-10-CM | POA: Diagnosis not present

## 2020-10-29 DIAGNOSIS — Z992 Dependence on renal dialysis: Secondary | ICD-10-CM | POA: Diagnosis not present

## 2020-10-29 DIAGNOSIS — N186 End stage renal disease: Secondary | ICD-10-CM | POA: Diagnosis not present

## 2020-10-29 DIAGNOSIS — Z23 Encounter for immunization: Secondary | ICD-10-CM | POA: Diagnosis not present

## 2020-10-29 DIAGNOSIS — D631 Anemia in chronic kidney disease: Secondary | ICD-10-CM | POA: Diagnosis not present

## 2020-10-29 DIAGNOSIS — D509 Iron deficiency anemia, unspecified: Secondary | ICD-10-CM | POA: Diagnosis not present

## 2020-10-31 DIAGNOSIS — D509 Iron deficiency anemia, unspecified: Secondary | ICD-10-CM | POA: Diagnosis not present

## 2020-10-31 DIAGNOSIS — Z992 Dependence on renal dialysis: Secondary | ICD-10-CM | POA: Diagnosis not present

## 2020-10-31 DIAGNOSIS — Z23 Encounter for immunization: Secondary | ICD-10-CM | POA: Diagnosis not present

## 2020-10-31 DIAGNOSIS — N2581 Secondary hyperparathyroidism of renal origin: Secondary | ICD-10-CM | POA: Diagnosis not present

## 2020-10-31 DIAGNOSIS — D631 Anemia in chronic kidney disease: Secondary | ICD-10-CM | POA: Diagnosis not present

## 2020-10-31 DIAGNOSIS — N186 End stage renal disease: Secondary | ICD-10-CM | POA: Diagnosis not present

## 2020-11-03 DIAGNOSIS — N2581 Secondary hyperparathyroidism of renal origin: Secondary | ICD-10-CM | POA: Diagnosis not present

## 2020-11-03 DIAGNOSIS — N186 End stage renal disease: Secondary | ICD-10-CM | POA: Diagnosis not present

## 2020-11-03 DIAGNOSIS — D631 Anemia in chronic kidney disease: Secondary | ICD-10-CM | POA: Diagnosis not present

## 2020-11-03 DIAGNOSIS — Z992 Dependence on renal dialysis: Secondary | ICD-10-CM | POA: Diagnosis not present

## 2020-11-03 DIAGNOSIS — Z23 Encounter for immunization: Secondary | ICD-10-CM | POA: Diagnosis not present

## 2020-11-03 DIAGNOSIS — D509 Iron deficiency anemia, unspecified: Secondary | ICD-10-CM | POA: Diagnosis not present

## 2020-11-03 DIAGNOSIS — E119 Type 2 diabetes mellitus without complications: Secondary | ICD-10-CM | POA: Diagnosis not present

## 2020-11-04 DIAGNOSIS — Z299 Encounter for prophylactic measures, unspecified: Secondary | ICD-10-CM | POA: Diagnosis not present

## 2020-11-04 DIAGNOSIS — Z6830 Body mass index (BMI) 30.0-30.9, adult: Secondary | ICD-10-CM | POA: Diagnosis not present

## 2020-11-04 DIAGNOSIS — E7800 Pure hypercholesterolemia, unspecified: Secondary | ICD-10-CM | POA: Diagnosis not present

## 2020-11-04 DIAGNOSIS — N2581 Secondary hyperparathyroidism of renal origin: Secondary | ICD-10-CM | POA: Diagnosis not present

## 2020-11-04 DIAGNOSIS — Z7189 Other specified counseling: Secondary | ICD-10-CM | POA: Diagnosis not present

## 2020-11-04 DIAGNOSIS — Z1331 Encounter for screening for depression: Secondary | ICD-10-CM | POA: Diagnosis not present

## 2020-11-04 DIAGNOSIS — M545 Low back pain, unspecified: Secondary | ICD-10-CM | POA: Diagnosis not present

## 2020-11-04 DIAGNOSIS — Z0000 Encounter for general adult medical examination without abnormal findings: Secondary | ICD-10-CM | POA: Diagnosis not present

## 2020-11-04 DIAGNOSIS — I10 Essential (primary) hypertension: Secondary | ICD-10-CM | POA: Diagnosis not present

## 2020-11-04 DIAGNOSIS — R5383 Other fatigue: Secondary | ICD-10-CM | POA: Diagnosis not present

## 2020-11-04 DIAGNOSIS — Z1339 Encounter for screening examination for other mental health and behavioral disorders: Secondary | ICD-10-CM | POA: Diagnosis not present

## 2020-11-04 DIAGNOSIS — E119 Type 2 diabetes mellitus without complications: Secondary | ICD-10-CM | POA: Diagnosis not present

## 2020-11-05 DIAGNOSIS — D631 Anemia in chronic kidney disease: Secondary | ICD-10-CM | POA: Diagnosis not present

## 2020-11-05 DIAGNOSIS — Z992 Dependence on renal dialysis: Secondary | ICD-10-CM | POA: Diagnosis not present

## 2020-11-05 DIAGNOSIS — D509 Iron deficiency anemia, unspecified: Secondary | ICD-10-CM | POA: Diagnosis not present

## 2020-11-05 DIAGNOSIS — N186 End stage renal disease: Secondary | ICD-10-CM | POA: Diagnosis not present

## 2020-11-05 DIAGNOSIS — Z23 Encounter for immunization: Secondary | ICD-10-CM | POA: Diagnosis not present

## 2020-11-05 DIAGNOSIS — N2581 Secondary hyperparathyroidism of renal origin: Secondary | ICD-10-CM | POA: Diagnosis not present

## 2020-11-06 DIAGNOSIS — E7800 Pure hypercholesterolemia, unspecified: Secondary | ICD-10-CM | POA: Diagnosis not present

## 2020-11-06 DIAGNOSIS — I10 Essential (primary) hypertension: Secondary | ICD-10-CM | POA: Diagnosis not present

## 2020-11-06 DIAGNOSIS — M159 Polyosteoarthritis, unspecified: Secondary | ICD-10-CM | POA: Diagnosis not present

## 2020-11-07 DIAGNOSIS — N186 End stage renal disease: Secondary | ICD-10-CM | POA: Diagnosis not present

## 2020-11-07 DIAGNOSIS — N2581 Secondary hyperparathyroidism of renal origin: Secondary | ICD-10-CM | POA: Diagnosis not present

## 2020-11-07 DIAGNOSIS — Z992 Dependence on renal dialysis: Secondary | ICD-10-CM | POA: Diagnosis not present

## 2020-11-07 DIAGNOSIS — D509 Iron deficiency anemia, unspecified: Secondary | ICD-10-CM | POA: Diagnosis not present

## 2020-11-07 DIAGNOSIS — Z23 Encounter for immunization: Secondary | ICD-10-CM | POA: Diagnosis not present

## 2020-11-07 DIAGNOSIS — D631 Anemia in chronic kidney disease: Secondary | ICD-10-CM | POA: Diagnosis not present

## 2020-11-10 DIAGNOSIS — N2581 Secondary hyperparathyroidism of renal origin: Secondary | ICD-10-CM | POA: Diagnosis not present

## 2020-11-10 DIAGNOSIS — Z992 Dependence on renal dialysis: Secondary | ICD-10-CM | POA: Diagnosis not present

## 2020-11-10 DIAGNOSIS — D631 Anemia in chronic kidney disease: Secondary | ICD-10-CM | POA: Diagnosis not present

## 2020-11-10 DIAGNOSIS — N186 End stage renal disease: Secondary | ICD-10-CM | POA: Diagnosis not present

## 2020-11-10 DIAGNOSIS — D509 Iron deficiency anemia, unspecified: Secondary | ICD-10-CM | POA: Diagnosis not present

## 2020-11-12 ENCOUNTER — Other Ambulatory Visit: Payer: Medicare Other

## 2020-11-12 DIAGNOSIS — N2581 Secondary hyperparathyroidism of renal origin: Secondary | ICD-10-CM | POA: Diagnosis not present

## 2020-11-12 DIAGNOSIS — Z992 Dependence on renal dialysis: Secondary | ICD-10-CM | POA: Diagnosis not present

## 2020-11-12 DIAGNOSIS — D509 Iron deficiency anemia, unspecified: Secondary | ICD-10-CM | POA: Diagnosis not present

## 2020-11-12 DIAGNOSIS — D631 Anemia in chronic kidney disease: Secondary | ICD-10-CM | POA: Diagnosis not present

## 2020-11-12 DIAGNOSIS — N186 End stage renal disease: Secondary | ICD-10-CM | POA: Diagnosis not present

## 2020-11-13 ENCOUNTER — Ambulatory Visit (INDEPENDENT_AMBULATORY_CARE_PROVIDER_SITE_OTHER): Payer: Medicare Other

## 2020-11-13 ENCOUNTER — Other Ambulatory Visit: Payer: Self-pay

## 2020-11-13 DIAGNOSIS — Z992 Dependence on renal dialysis: Secondary | ICD-10-CM | POA: Diagnosis not present

## 2020-11-13 DIAGNOSIS — Z6830 Body mass index (BMI) 30.0-30.9, adult: Secondary | ICD-10-CM | POA: Diagnosis not present

## 2020-11-13 DIAGNOSIS — I428 Other cardiomyopathies: Secondary | ICD-10-CM | POA: Diagnosis not present

## 2020-11-13 DIAGNOSIS — Z299 Encounter for prophylactic measures, unspecified: Secondary | ICD-10-CM | POA: Diagnosis not present

## 2020-11-13 DIAGNOSIS — Z87891 Personal history of nicotine dependence: Secondary | ICD-10-CM | POA: Diagnosis not present

## 2020-11-13 DIAGNOSIS — I10 Essential (primary) hypertension: Secondary | ICD-10-CM | POA: Diagnosis not present

## 2020-11-13 DIAGNOSIS — E1165 Type 2 diabetes mellitus with hyperglycemia: Secondary | ICD-10-CM | POA: Diagnosis not present

## 2020-11-13 DIAGNOSIS — M545 Low back pain, unspecified: Secondary | ICD-10-CM | POA: Diagnosis not present

## 2020-11-14 DIAGNOSIS — N2581 Secondary hyperparathyroidism of renal origin: Secondary | ICD-10-CM | POA: Diagnosis not present

## 2020-11-14 DIAGNOSIS — D631 Anemia in chronic kidney disease: Secondary | ICD-10-CM | POA: Diagnosis not present

## 2020-11-14 DIAGNOSIS — N186 End stage renal disease: Secondary | ICD-10-CM | POA: Diagnosis not present

## 2020-11-14 DIAGNOSIS — D509 Iron deficiency anemia, unspecified: Secondary | ICD-10-CM | POA: Diagnosis not present

## 2020-11-14 DIAGNOSIS — Z992 Dependence on renal dialysis: Secondary | ICD-10-CM | POA: Diagnosis not present

## 2020-11-14 LAB — ECHOCARDIOGRAM COMPLETE
AR max vel: 2.37 cm2
AV Area VTI: 2.48 cm2
AV Area mean vel: 2.2 cm2
AV Mean grad: 3.6 mmHg
AV Peak grad: 6.9 mmHg
Ao pk vel: 1.31 m/s
Area-P 1/2: 2.76 cm2
Calc EF: 54.8 %
MV M vel: 4.36 m/s
MV Peak grad: 75.9 mmHg
P 1/2 time: 1066 msec
S' Lateral: 4.32 cm
Single Plane A2C EF: 56.8 %
Single Plane A4C EF: 50.7 %

## 2020-11-17 ENCOUNTER — Other Ambulatory Visit: Payer: Self-pay | Admitting: Family Medicine

## 2020-11-17 DIAGNOSIS — D509 Iron deficiency anemia, unspecified: Secondary | ICD-10-CM | POA: Diagnosis not present

## 2020-11-17 DIAGNOSIS — N186 End stage renal disease: Secondary | ICD-10-CM | POA: Diagnosis not present

## 2020-11-17 DIAGNOSIS — B351 Tinea unguium: Secondary | ICD-10-CM | POA: Diagnosis not present

## 2020-11-17 DIAGNOSIS — N2581 Secondary hyperparathyroidism of renal origin: Secondary | ICD-10-CM | POA: Diagnosis not present

## 2020-11-17 DIAGNOSIS — E1140 Type 2 diabetes mellitus with diabetic neuropathy, unspecified: Secondary | ICD-10-CM | POA: Diagnosis not present

## 2020-11-17 DIAGNOSIS — L110 Acquired keratosis follicularis: Secondary | ICD-10-CM | POA: Diagnosis not present

## 2020-11-17 DIAGNOSIS — D631 Anemia in chronic kidney disease: Secondary | ICD-10-CM | POA: Diagnosis not present

## 2020-11-17 DIAGNOSIS — Z992 Dependence on renal dialysis: Secondary | ICD-10-CM | POA: Diagnosis not present

## 2020-11-17 DIAGNOSIS — E1151 Type 2 diabetes mellitus with diabetic peripheral angiopathy without gangrene: Secondary | ICD-10-CM | POA: Diagnosis not present

## 2020-11-19 DIAGNOSIS — D631 Anemia in chronic kidney disease: Secondary | ICD-10-CM | POA: Diagnosis not present

## 2020-11-19 DIAGNOSIS — N186 End stage renal disease: Secondary | ICD-10-CM | POA: Diagnosis not present

## 2020-11-19 DIAGNOSIS — N2581 Secondary hyperparathyroidism of renal origin: Secondary | ICD-10-CM | POA: Diagnosis not present

## 2020-11-19 DIAGNOSIS — D509 Iron deficiency anemia, unspecified: Secondary | ICD-10-CM | POA: Diagnosis not present

## 2020-11-19 DIAGNOSIS — Z992 Dependence on renal dialysis: Secondary | ICD-10-CM | POA: Diagnosis not present

## 2020-11-21 DIAGNOSIS — N186 End stage renal disease: Secondary | ICD-10-CM | POA: Diagnosis not present

## 2020-11-21 DIAGNOSIS — D631 Anemia in chronic kidney disease: Secondary | ICD-10-CM | POA: Diagnosis not present

## 2020-11-21 DIAGNOSIS — N2581 Secondary hyperparathyroidism of renal origin: Secondary | ICD-10-CM | POA: Diagnosis not present

## 2020-11-21 DIAGNOSIS — D509 Iron deficiency anemia, unspecified: Secondary | ICD-10-CM | POA: Diagnosis not present

## 2020-11-21 DIAGNOSIS — Z992 Dependence on renal dialysis: Secondary | ICD-10-CM | POA: Diagnosis not present

## 2020-11-24 ENCOUNTER — Ambulatory Visit: Payer: Medicare Other | Admitting: Cardiology

## 2020-11-24 DIAGNOSIS — D631 Anemia in chronic kidney disease: Secondary | ICD-10-CM | POA: Diagnosis not present

## 2020-11-24 DIAGNOSIS — N2581 Secondary hyperparathyroidism of renal origin: Secondary | ICD-10-CM | POA: Diagnosis not present

## 2020-11-24 DIAGNOSIS — N186 End stage renal disease: Secondary | ICD-10-CM | POA: Diagnosis not present

## 2020-11-24 DIAGNOSIS — D509 Iron deficiency anemia, unspecified: Secondary | ICD-10-CM | POA: Diagnosis not present

## 2020-11-24 DIAGNOSIS — Z992 Dependence on renal dialysis: Secondary | ICD-10-CM | POA: Diagnosis not present

## 2020-11-26 DIAGNOSIS — N186 End stage renal disease: Secondary | ICD-10-CM | POA: Diagnosis not present

## 2020-11-26 DIAGNOSIS — N2581 Secondary hyperparathyroidism of renal origin: Secondary | ICD-10-CM | POA: Diagnosis not present

## 2020-11-26 DIAGNOSIS — D509 Iron deficiency anemia, unspecified: Secondary | ICD-10-CM | POA: Diagnosis not present

## 2020-11-26 DIAGNOSIS — Z992 Dependence on renal dialysis: Secondary | ICD-10-CM | POA: Diagnosis not present

## 2020-11-26 DIAGNOSIS — D631 Anemia in chronic kidney disease: Secondary | ICD-10-CM | POA: Diagnosis not present

## 2020-11-27 ENCOUNTER — Encounter: Payer: Self-pay | Admitting: Cardiology

## 2020-11-27 ENCOUNTER — Encounter: Payer: Self-pay | Admitting: *Deleted

## 2020-11-27 ENCOUNTER — Ambulatory Visit (INDEPENDENT_AMBULATORY_CARE_PROVIDER_SITE_OTHER): Payer: Medicare Other | Admitting: Cardiology

## 2020-11-27 VITALS — BP 166/88 | HR 72 | Ht 74.0 in | Wt 234.6 lb

## 2020-11-27 DIAGNOSIS — I493 Ventricular premature depolarization: Secondary | ICD-10-CM | POA: Diagnosis not present

## 2020-11-27 DIAGNOSIS — I428 Other cardiomyopathies: Secondary | ICD-10-CM | POA: Diagnosis not present

## 2020-11-27 DIAGNOSIS — I4891 Unspecified atrial fibrillation: Secondary | ICD-10-CM

## 2020-11-27 NOTE — Progress Notes (Signed)
Clinical Summary Todd Robertson is a 75 y.o.male seen today for follow up of the following medical problems.   1. NICM  - previously significant LV dysfunction with LVEF 25-30% in 2004, last echo 2011 shows normalized LVEF at 60%. Cath 2004 with no significant coronary disease - 08/2017 echo LVEF 55-60%   - no recent SOB/DOE - no recent edema - compliant with meds   05/2020 echo LVEF 45-50%.  Jan 2022 Echo: LVEF 38-75%, grade I diastolic dysfunction - compliant with meds - on midodrine 10mg  tid.   2. HTN  - more recent issue since starting HD is low bp;s, has been on midodrine.    3. HL  -compliant with statin  4. Prostate Cancer - treated radiation at [facility] - followed by urologist.   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   7. Renal cell carcinoma - s/p nephrectomy  8. Dilated aortic root - 07/2020 echo aortic root 41 mm   9. Afib - issues after nephrectomy during 06/2020 admission. Admission also complicated by postop bleed, sepsis - was not started on anticoag due to postop bleeding - no recent palpitations.    Past Medical History:  Diagnosis Date  . Arthritis   . Cancer [facility])    prostate  . Cardiomyopathy    secondary  . CKD (chronic kidney disease)   . DM2 (diabetes mellitus, type 2) ([place])   . Gout   . HTN (hypertension)    unspec  . Hypercholesterolemia   . Nonischemic cardiomyopathy ([place])    a. EF 40-50% by most recent 2-D echo b. EF 25% by cardiac cath in 2004  . Overweight(278.02)      No Known Allergies   Current Outpatient Medications  Medication Sig Dispense Refill  . acetaminophen (TYLENOL) 325 MG tablet Take 1-2 tablets (325-650 mg total) by mouth every 4 (four) hours as needed for mild pain.    Marland Kitchen albuterol (VENTOLIN HFA) 108 (90 Base) MCG/ACT inhaler Inhale 1-2 puffs into the lungs  every 6 (six) hours as needed for wheezing or shortness of breath. 6.7 g 1  . atorvastatin (LIPITOR) 40 MG tablet Take 1 tablet (40 mg total) by mouth daily. 90 tablet 3  . budesonide-formoterol (SYMBICORT) 160-4.5 MCG/ACT inhaler Inhale 2 puffs into the lungs 2 (two) times daily. 1 each 12  . calcium acetate (PHOSLO) 667 MG capsule Take 3 capsules (2,001 mg total) by mouth 3 (three) times daily with meals. 180 capsule 1  . Cholecalciferol (VITAMIN D) 50 MCG (2000 UT) tablet Take 1 tablet (2,000 Units total) by mouth daily. 30 tablet 0  . hydrocortisone (CORTEF) 10 MG tablet Take 1 tablet (10 mg total) by mouth 2 (two) times daily with a meal. 60 tablet 0  . insulin lispro (HUMALOG) 100 UNIT/ML KwikPen Inject into the skin.    Marland Kitchen loratadine (CLARITIN) 10 MG tablet Take 10 mg by mouth daily.    . metoprolol tartrate (LOPRESSOR) 25 MG tablet Take 0.5 tablets (12.5 mg total) by mouth 2 (two) times daily. 90 tablet 3  . midodrine (PROAMATINE) 10 MG tablet TAKE 1 TABLET BY MOUTH THREE TIMES DAILY WITH MEALS 90 tablet 1  . Multiple Vitamins-Minerals (CENTRUM SILVER 50+MEN) TABS Take 1 tablet by mouth daily.    . multivitamin (RENA-VIT) TABS tablet Take 1 tablet by mouth at bedtime. 30 tablet 0  . senna (SENOKOT)  8.6 MG tablet Take 1 tablet by mouth daily as needed for constipation.      No current facility-administered medications for this visit.     Past Surgical History:  Procedure Laterality Date  . AV FISTULA PLACEMENT Left 06/06/2020   Procedure: LEFT ARM ARTERIOVENOUS (AV) GRAFT USING 45CM GORTEX;  Surgeon: [name], MD;  Location: [place];  Service: Vascular;  Laterality: Left;  . COLONOSCOPY    . HERNIA REPAIR    . INSERTION OF DIALYSIS CATHETER Right 06/06/2020   Procedure: INSERTION OF DIALYSIS CATHETER USING 23CM DOUBLE LUMEN CATHETER;  Surgeon: [name], MD;  Location: [place];  Service: Vascular;  Laterality: Right;  . IR FLUORO GUIDE CV LINE RIGHT  05/20/2020  . IR US GUIDE VASC  ACCESS RIGHT  05/20/2020  . KNEE ARTHROPLASTY Left 08/08/2017   Procedure: LEFT TOTAL KNEE ARTHROPLASTY WITH COMPUTER NAVIGATION;  Surgeon: Rod Can, MD;  Location: [place];  Service: Orthopedics;  Laterality: Left;  Needs RNFA  . KNEE ARTHROPLASTY Right 11/17/2017   Procedure: RIGHT TOTAL KNEE ARTHROPLASTY WITH COMPUTER NAVIGATION;  Surgeon: Rod Can, MD;  [facility];  Service: Orthopedics;  Laterality: Right;  NEEDS RNFA  . ROBOT ASSISTED LAPAROSCOPIC NEPHRECTOMY Right 05/16/2020   Procedure: XI ROBOTIC ASSISTED LAPAROSCOPIC NEPHRECTOMY;  Surgeon: Cleon Gustin, MD;  Location: [facility];  Service: Urology;  Laterality: Right;  2.5 hrs     No Known Allergies    Family History  Problem Relation Age of Onset  . Hypertension Mother   . Kidney disease Mother   . Heart disease Father   . Kidney disease Other      Social History Todd Robertson reports that he quit smoking about 36 years ago. His smoking use included cigarettes. He started smoking about 54 years ago. He has a 10.00 pack-year smoking history. He has never used smokeless tobacco. Todd Robertson reports no history of alcohol use.   Review of Systems CONSTITUTIONAL: No weight loss, fever, chills, weakness or fatigue.  HEENT: Eyes: No visual loss, blurred vision, double vision or yellow sclerae.No hearing loss, sneezing, congestion, runny nose or sore throat.  SKIN: No rash or itching.  CARDIOVASCULAR: per hpi RESPIRATORY: No shortness of breath, cough or sputum.  GASTROINTESTINAL: No anorexia, nausea, vomiting or diarrhea. No abdominal pain or blood.  GENITOURINARY: No burning on urination, no polyuria NEUROLOGICAL: No headache, dizziness, syncope, paralysis, ataxia, numbness or tingling in the extremities. No change in bowel or bladder control.  MUSCULOSKELETAL: No muscle, back pain, joint pain or stiffness.  LYMPHATICS: No enlarged nodes. No history of splenectomy.  PSYCHIATRIC: No history of depression or  anxiety.  ENDOCRINOLOGIC: No reports of sweating, cold or heat intolerance. No polyuria or polydipsia.  Marland Kitchen   Physical Examination Today's Vitals   11/27/20 1407  BP: (!) 166/88  Pulse: 72  SpO2: 98%  Weight: 234 lb 9.6 oz (106.4 kg)  Height: 6\' 2"  (1.88 m)   Body mass index is 30.12 kg/m.  Gen: resting comfortably, no acute distress HEENT: no scleral icterus, pupils equal round and reactive, no palptable cervical adenopathy,  CV: RRR, no m/r/g, no jvd Resp: Clear to auscultation bilaterally GI: abdomen is soft, non-tender, non-distended, normal bowel sounds, no hepatosplenomegaly MSK: extremities are warm, no edema.  Skin: warm, no rash Neuro:  no focal deficits Psych: appropriate affect   Diagnostic Studies  Echocardiogram 05/28/2020. 1. Left ventricular ejection fraction, by estimation, is 45 to 50%. The left ventricle has mildly decreased function.  The left ventricle demonstrates global hypokinesis. There is mild left ventricular hypertrophy. Left ventricular diastolic parameters are consistent with Grade II diastolic dysfunction (pseudonormalization). 2. Right ventricular systolic function is normal. The right ventricular size is mildly enlarged. Tricuspid regurgitation signal is inadequate for assessing PA pressure. 3. Right atrial size was mildly dilated. 4. The mitral valve is normal in structure. No evidence of mitral valve regurgitation. No evidence of mitral stenosis. 5. The aortic valve is tricuspid. Aortic valve regurgitation is not visualized. Mild aortic valve sclerosis is present, with no evidence of aortic valve stenosis. 6. Aortic dilatation noted. 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 7. Technically difficult study with poor images.   Lower venous Doppler study 05/29/2020 RIGHT - There is no evidence of deep vein thrombosis in the lower extremity. - No cystic structure found in the popliteal fossa. LEFT: - There is no  evidence of deep vein thrombosis in the lower extremity. - No cystic structure found in the popliteal fossa.   Assessment and Plan  1. Chronic systolic HF/NICM - most recent echo shows LVEF has normalized - fluid management per HD - medical therapy limited by low bp's, currently on midodrine - continue to monitor  2. Afib - appears isolated during admission in postop setting complicated by sepsis, severe anemia. - EKG today shows NSR - monitor at this time, has not been committed to long term anticoag  3. PVCs - no symptoms, continue beta blocker.       Arnoldo Lenis, M.D

## 2020-11-27 NOTE — Patient Instructions (Signed)

## 2020-11-28 DIAGNOSIS — N2581 Secondary hyperparathyroidism of renal origin: Secondary | ICD-10-CM | POA: Diagnosis not present

## 2020-11-28 DIAGNOSIS — E1122 Type 2 diabetes mellitus with diabetic chronic kidney disease: Secondary | ICD-10-CM | POA: Diagnosis not present

## 2020-11-28 DIAGNOSIS — D509 Iron deficiency anemia, unspecified: Secondary | ICD-10-CM | POA: Diagnosis not present

## 2020-11-28 DIAGNOSIS — I120 Hypertensive chronic kidney disease with stage 5 chronic kidney disease or end stage renal disease: Secondary | ICD-10-CM | POA: Diagnosis not present

## 2020-11-28 DIAGNOSIS — N186 End stage renal disease: Secondary | ICD-10-CM | POA: Diagnosis not present

## 2020-11-28 DIAGNOSIS — E1165 Type 2 diabetes mellitus with hyperglycemia: Secondary | ICD-10-CM | POA: Diagnosis not present

## 2020-11-28 DIAGNOSIS — Z87891 Personal history of nicotine dependence: Secondary | ICD-10-CM | POA: Diagnosis not present

## 2020-11-28 DIAGNOSIS — Z794 Long term (current) use of insulin: Secondary | ICD-10-CM | POA: Diagnosis not present

## 2020-11-28 DIAGNOSIS — D631 Anemia in chronic kidney disease: Secondary | ICD-10-CM | POA: Diagnosis not present

## 2020-11-28 DIAGNOSIS — J449 Chronic obstructive pulmonary disease, unspecified: Secondary | ICD-10-CM | POA: Diagnosis not present

## 2020-11-28 DIAGNOSIS — C641 Malignant neoplasm of right kidney, except renal pelvis: Secondary | ICD-10-CM | POA: Diagnosis not present

## 2020-11-28 DIAGNOSIS — Z992 Dependence on renal dialysis: Secondary | ICD-10-CM | POA: Diagnosis not present

## 2020-12-01 DIAGNOSIS — D631 Anemia in chronic kidney disease: Secondary | ICD-10-CM | POA: Diagnosis not present

## 2020-12-01 DIAGNOSIS — N186 End stage renal disease: Secondary | ICD-10-CM | POA: Diagnosis not present

## 2020-12-01 DIAGNOSIS — D509 Iron deficiency anemia, unspecified: Secondary | ICD-10-CM | POA: Diagnosis not present

## 2020-12-01 DIAGNOSIS — Z992 Dependence on renal dialysis: Secondary | ICD-10-CM | POA: Diagnosis not present

## 2020-12-01 DIAGNOSIS — N2581 Secondary hyperparathyroidism of renal origin: Secondary | ICD-10-CM | POA: Diagnosis not present

## 2020-12-03 DIAGNOSIS — N2581 Secondary hyperparathyroidism of renal origin: Secondary | ICD-10-CM | POA: Diagnosis not present

## 2020-12-03 DIAGNOSIS — I120 Hypertensive chronic kidney disease with stage 5 chronic kidney disease or end stage renal disease: Secondary | ICD-10-CM | POA: Diagnosis not present

## 2020-12-03 DIAGNOSIS — D631 Anemia in chronic kidney disease: Secondary | ICD-10-CM | POA: Diagnosis not present

## 2020-12-03 DIAGNOSIS — E1122 Type 2 diabetes mellitus with diabetic chronic kidney disease: Secondary | ICD-10-CM | POA: Diagnosis not present

## 2020-12-03 DIAGNOSIS — N186 End stage renal disease: Secondary | ICD-10-CM | POA: Diagnosis not present

## 2020-12-03 DIAGNOSIS — D509 Iron deficiency anemia, unspecified: Secondary | ICD-10-CM | POA: Diagnosis not present

## 2020-12-03 DIAGNOSIS — J449 Chronic obstructive pulmonary disease, unspecified: Secondary | ICD-10-CM | POA: Diagnosis not present

## 2020-12-03 DIAGNOSIS — E1165 Type 2 diabetes mellitus with hyperglycemia: Secondary | ICD-10-CM | POA: Diagnosis not present

## 2020-12-03 DIAGNOSIS — Z992 Dependence on renal dialysis: Secondary | ICD-10-CM | POA: Diagnosis not present

## 2020-12-03 DIAGNOSIS — C641 Malignant neoplasm of right kidney, except renal pelvis: Secondary | ICD-10-CM | POA: Diagnosis not present

## 2020-12-04 DIAGNOSIS — T402X5A Adverse effect of other opioids, initial encounter: Secondary | ICD-10-CM | POA: Diagnosis not present

## 2020-12-04 DIAGNOSIS — N186 End stage renal disease: Secondary | ICD-10-CM | POA: Diagnosis not present

## 2020-12-04 DIAGNOSIS — M9973 Connective tissue and disc stenosis of intervertebral foramina of lumbar region: Secondary | ICD-10-CM | POA: Diagnosis not present

## 2020-12-04 DIAGNOSIS — M5126 Other intervertebral disc displacement, lumbar region: Secondary | ICD-10-CM | POA: Diagnosis not present

## 2020-12-04 DIAGNOSIS — E1122 Type 2 diabetes mellitus with diabetic chronic kidney disease: Secondary | ICD-10-CM | POA: Diagnosis not present

## 2020-12-04 DIAGNOSIS — M48061 Spinal stenosis, lumbar region without neurogenic claudication: Secondary | ICD-10-CM | POA: Diagnosis not present

## 2020-12-04 DIAGNOSIS — K5903 Drug induced constipation: Secondary | ICD-10-CM | POA: Diagnosis not present

## 2020-12-04 DIAGNOSIS — M5416 Radiculopathy, lumbar region: Secondary | ICD-10-CM | POA: Diagnosis not present

## 2020-12-04 DIAGNOSIS — M4316 Spondylolisthesis, lumbar region: Secondary | ICD-10-CM | POA: Diagnosis not present

## 2020-12-04 DIAGNOSIS — Z299 Encounter for prophylactic measures, unspecified: Secondary | ICD-10-CM | POA: Diagnosis not present

## 2020-12-04 DIAGNOSIS — M47816 Spondylosis without myelopathy or radiculopathy, lumbar region: Secondary | ICD-10-CM | POA: Diagnosis not present

## 2020-12-04 DIAGNOSIS — M792 Neuralgia and neuritis, unspecified: Secondary | ICD-10-CM | POA: Diagnosis not present

## 2020-12-04 DIAGNOSIS — M5136 Other intervertebral disc degeneration, lumbar region: Secondary | ICD-10-CM | POA: Diagnosis not present

## 2020-12-04 DIAGNOSIS — E1165 Type 2 diabetes mellitus with hyperglycemia: Secondary | ICD-10-CM | POA: Diagnosis not present

## 2020-12-04 DIAGNOSIS — Z79891 Long term (current) use of opiate analgesic: Secondary | ICD-10-CM | POA: Diagnosis not present

## 2020-12-04 DIAGNOSIS — I10 Essential (primary) hypertension: Secondary | ICD-10-CM | POA: Diagnosis not present

## 2020-12-04 DIAGNOSIS — Z87891 Personal history of nicotine dependence: Secondary | ICD-10-CM | POA: Diagnosis not present

## 2020-12-04 DIAGNOSIS — I120 Hypertensive chronic kidney disease with stage 5 chronic kidney disease or end stage renal disease: Secondary | ICD-10-CM | POA: Diagnosis not present

## 2020-12-04 DIAGNOSIS — M545 Low back pain, unspecified: Secondary | ICD-10-CM | POA: Diagnosis not present

## 2020-12-04 DIAGNOSIS — Z6830 Body mass index (BMI) 30.0-30.9, adult: Secondary | ICD-10-CM | POA: Diagnosis not present

## 2020-12-05 DIAGNOSIS — I120 Hypertensive chronic kidney disease with stage 5 chronic kidney disease or end stage renal disease: Secondary | ICD-10-CM | POA: Diagnosis not present

## 2020-12-05 DIAGNOSIS — N2581 Secondary hyperparathyroidism of renal origin: Secondary | ICD-10-CM | POA: Diagnosis not present

## 2020-12-05 DIAGNOSIS — D509 Iron deficiency anemia, unspecified: Secondary | ICD-10-CM | POA: Diagnosis not present

## 2020-12-05 DIAGNOSIS — E1165 Type 2 diabetes mellitus with hyperglycemia: Secondary | ICD-10-CM | POA: Diagnosis not present

## 2020-12-05 DIAGNOSIS — E1122 Type 2 diabetes mellitus with diabetic chronic kidney disease: Secondary | ICD-10-CM | POA: Diagnosis not present

## 2020-12-05 DIAGNOSIS — Z992 Dependence on renal dialysis: Secondary | ICD-10-CM | POA: Diagnosis not present

## 2020-12-05 DIAGNOSIS — C641 Malignant neoplasm of right kidney, except renal pelvis: Secondary | ICD-10-CM | POA: Diagnosis not present

## 2020-12-05 DIAGNOSIS — N186 End stage renal disease: Secondary | ICD-10-CM | POA: Diagnosis not present

## 2020-12-05 DIAGNOSIS — D631 Anemia in chronic kidney disease: Secondary | ICD-10-CM | POA: Diagnosis not present

## 2020-12-05 DIAGNOSIS — J449 Chronic obstructive pulmonary disease, unspecified: Secondary | ICD-10-CM | POA: Diagnosis not present

## 2020-12-08 DIAGNOSIS — D509 Iron deficiency anemia, unspecified: Secondary | ICD-10-CM | POA: Diagnosis not present

## 2020-12-08 DIAGNOSIS — M5416 Radiculopathy, lumbar region: Secondary | ICD-10-CM | POA: Diagnosis not present

## 2020-12-08 DIAGNOSIS — N2581 Secondary hyperparathyroidism of renal origin: Secondary | ICD-10-CM | POA: Diagnosis not present

## 2020-12-08 DIAGNOSIS — D631 Anemia in chronic kidney disease: Secondary | ICD-10-CM | POA: Diagnosis not present

## 2020-12-08 DIAGNOSIS — N186 End stage renal disease: Secondary | ICD-10-CM | POA: Diagnosis not present

## 2020-12-08 DIAGNOSIS — Z992 Dependence on renal dialysis: Secondary | ICD-10-CM | POA: Diagnosis not present

## 2020-12-09 DIAGNOSIS — C641 Malignant neoplasm of right kidney, except renal pelvis: Secondary | ICD-10-CM | POA: Diagnosis not present

## 2020-12-09 DIAGNOSIS — E1165 Type 2 diabetes mellitus with hyperglycemia: Secondary | ICD-10-CM | POA: Diagnosis not present

## 2020-12-09 DIAGNOSIS — N186 End stage renal disease: Secondary | ICD-10-CM | POA: Diagnosis not present

## 2020-12-09 DIAGNOSIS — I120 Hypertensive chronic kidney disease with stage 5 chronic kidney disease or end stage renal disease: Secondary | ICD-10-CM | POA: Diagnosis not present

## 2020-12-09 DIAGNOSIS — E1122 Type 2 diabetes mellitus with diabetic chronic kidney disease: Secondary | ICD-10-CM | POA: Diagnosis not present

## 2020-12-09 DIAGNOSIS — J449 Chronic obstructive pulmonary disease, unspecified: Secondary | ICD-10-CM | POA: Diagnosis not present

## 2020-12-10 DIAGNOSIS — D509 Iron deficiency anemia, unspecified: Secondary | ICD-10-CM | POA: Diagnosis not present

## 2020-12-10 DIAGNOSIS — Z992 Dependence on renal dialysis: Secondary | ICD-10-CM | POA: Diagnosis not present

## 2020-12-10 DIAGNOSIS — D631 Anemia in chronic kidney disease: Secondary | ICD-10-CM | POA: Diagnosis not present

## 2020-12-10 DIAGNOSIS — N186 End stage renal disease: Secondary | ICD-10-CM | POA: Diagnosis not present

## 2020-12-10 IMAGING — DX DG CHEST 1V PORT SAME DAY
1 series · 1 of 1 positions shown · non-contrast
Comparison: 05/26/2020

CLINICAL DATA: Postop.

EXAM:
PORTABLE CHEST 1 VIEW

[chest]
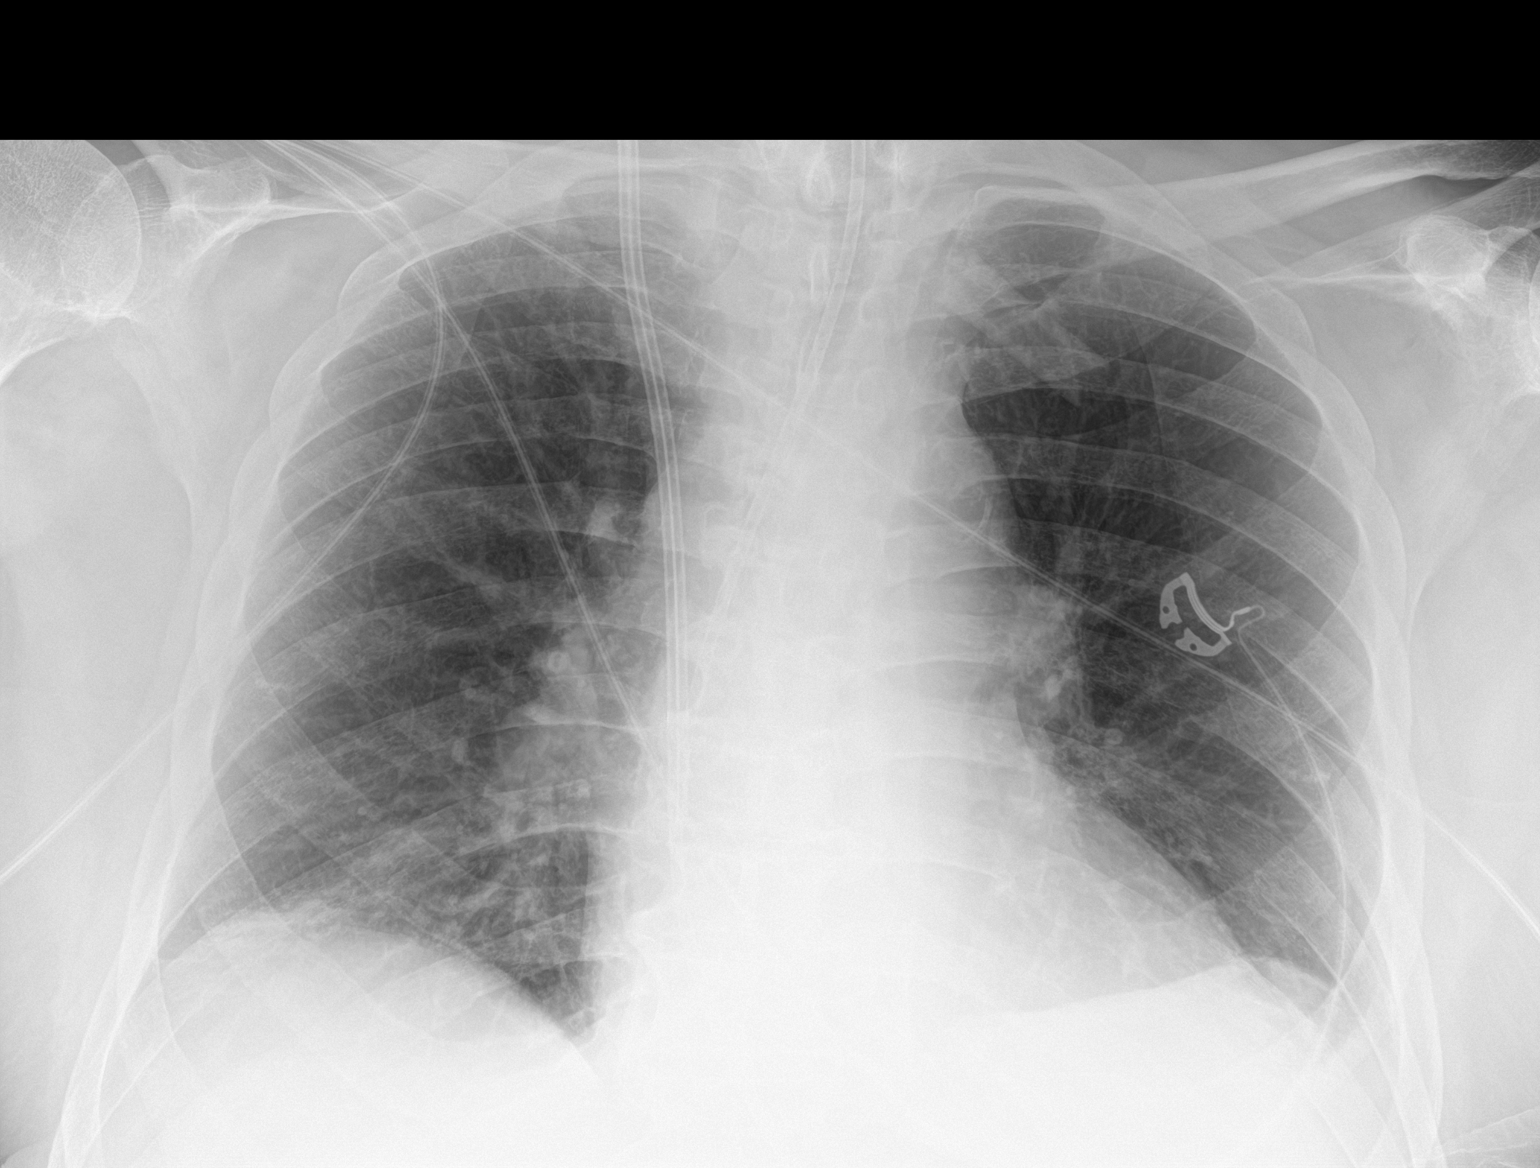

[1 of 1 positions shown; findings below may reference images not displayed]

FINDINGS: Right internal jugular central venous catheter tip at the atrial
caval junction. Weighted enteric tube in place with tip below the
diaphragm not included in the field of view. No pneumothorax. Stable
heart size and mediastinal contours. Improvement in left basilar
airspace disease from prior exam. Unchanged right basilar
atelectasis. No significant pleural effusion. No other interval
change.
IMPRESSION: 1. Right-sided dialysis catheter tip at the atrial caval junction.
No pneumothorax.
2. Improvement in left basilar airspace disease from prior exam.
Unchanged right basilar atelectasis.

## 2020-12-12 DIAGNOSIS — E1165 Type 2 diabetes mellitus with hyperglycemia: Secondary | ICD-10-CM | POA: Diagnosis not present

## 2020-12-12 DIAGNOSIS — E1122 Type 2 diabetes mellitus with diabetic chronic kidney disease: Secondary | ICD-10-CM | POA: Diagnosis not present

## 2020-12-12 DIAGNOSIS — Z992 Dependence on renal dialysis: Secondary | ICD-10-CM | POA: Diagnosis not present

## 2020-12-12 DIAGNOSIS — N186 End stage renal disease: Secondary | ICD-10-CM | POA: Diagnosis not present

## 2020-12-12 DIAGNOSIS — J449 Chronic obstructive pulmonary disease, unspecified: Secondary | ICD-10-CM | POA: Diagnosis not present

## 2020-12-12 DIAGNOSIS — D631 Anemia in chronic kidney disease: Secondary | ICD-10-CM | POA: Diagnosis not present

## 2020-12-12 DIAGNOSIS — I120 Hypertensive chronic kidney disease with stage 5 chronic kidney disease or end stage renal disease: Secondary | ICD-10-CM | POA: Diagnosis not present

## 2020-12-12 DIAGNOSIS — C641 Malignant neoplasm of right kidney, except renal pelvis: Secondary | ICD-10-CM | POA: Diagnosis not present

## 2020-12-12 DIAGNOSIS — D509 Iron deficiency anemia, unspecified: Secondary | ICD-10-CM | POA: Diagnosis not present

## 2020-12-15 DIAGNOSIS — D631 Anemia in chronic kidney disease: Secondary | ICD-10-CM | POA: Diagnosis not present

## 2020-12-15 DIAGNOSIS — D509 Iron deficiency anemia, unspecified: Secondary | ICD-10-CM | POA: Diagnosis not present

## 2020-12-15 DIAGNOSIS — Z992 Dependence on renal dialysis: Secondary | ICD-10-CM | POA: Diagnosis not present

## 2020-12-15 DIAGNOSIS — N186 End stage renal disease: Secondary | ICD-10-CM | POA: Diagnosis not present

## 2020-12-16 DIAGNOSIS — E1122 Type 2 diabetes mellitus with diabetic chronic kidney disease: Secondary | ICD-10-CM | POA: Diagnosis not present

## 2020-12-16 DIAGNOSIS — J449 Chronic obstructive pulmonary disease, unspecified: Secondary | ICD-10-CM | POA: Diagnosis not present

## 2020-12-16 DIAGNOSIS — C641 Malignant neoplasm of right kidney, except renal pelvis: Secondary | ICD-10-CM | POA: Diagnosis not present

## 2020-12-16 DIAGNOSIS — I120 Hypertensive chronic kidney disease with stage 5 chronic kidney disease or end stage renal disease: Secondary | ICD-10-CM | POA: Diagnosis not present

## 2020-12-16 DIAGNOSIS — E1165 Type 2 diabetes mellitus with hyperglycemia: Secondary | ICD-10-CM | POA: Diagnosis not present

## 2020-12-16 DIAGNOSIS — N186 End stage renal disease: Secondary | ICD-10-CM | POA: Diagnosis not present

## 2020-12-17 DIAGNOSIS — D509 Iron deficiency anemia, unspecified: Secondary | ICD-10-CM | POA: Diagnosis not present

## 2020-12-17 DIAGNOSIS — D631 Anemia in chronic kidney disease: Secondary | ICD-10-CM | POA: Diagnosis not present

## 2020-12-17 DIAGNOSIS — Z992 Dependence on renal dialysis: Secondary | ICD-10-CM | POA: Diagnosis not present

## 2020-12-17 DIAGNOSIS — N186 End stage renal disease: Secondary | ICD-10-CM | POA: Diagnosis not present

## 2020-12-18 DIAGNOSIS — C641 Malignant neoplasm of right kidney, except renal pelvis: Secondary | ICD-10-CM | POA: Diagnosis not present

## 2020-12-18 DIAGNOSIS — E1122 Type 2 diabetes mellitus with diabetic chronic kidney disease: Secondary | ICD-10-CM | POA: Diagnosis not present

## 2020-12-18 DIAGNOSIS — N186 End stage renal disease: Secondary | ICD-10-CM | POA: Diagnosis not present

## 2020-12-18 DIAGNOSIS — J449 Chronic obstructive pulmonary disease, unspecified: Secondary | ICD-10-CM | POA: Diagnosis not present

## 2020-12-18 DIAGNOSIS — I120 Hypertensive chronic kidney disease with stage 5 chronic kidney disease or end stage renal disease: Secondary | ICD-10-CM | POA: Diagnosis not present

## 2020-12-18 DIAGNOSIS — E1165 Type 2 diabetes mellitus with hyperglycemia: Secondary | ICD-10-CM | POA: Diagnosis not present

## 2020-12-18 IMAGING — DX DG ABDOMEN 1V
1 series · 1 of 1 positions shown · non-contrast
Comparison: 05/30/2020

CLINICAL DATA: Abdominal distention.

EXAM:
ABDOMEN - 1 VIEW

[abdomen kub]
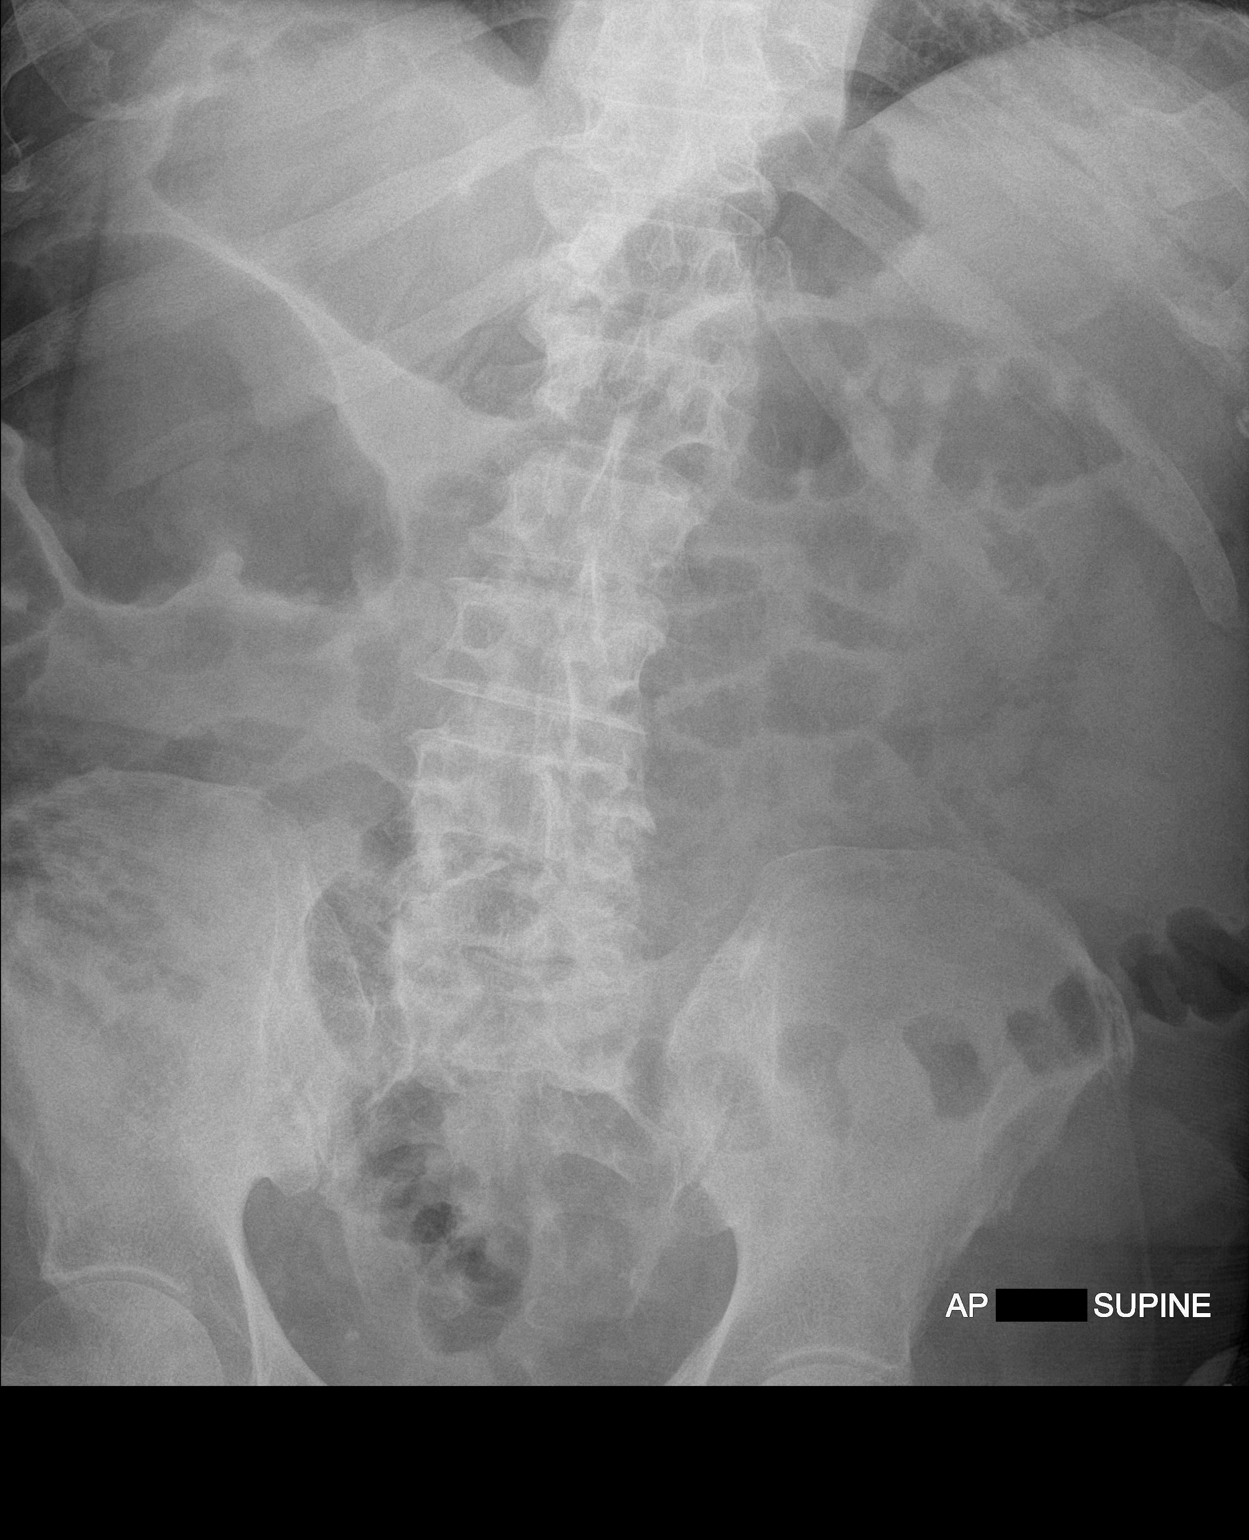

[1 of 1 positions shown; findings below may reference images not displayed]

FINDINGS: Nasogastric tube has been removed. Bowel gas pattern is
nonobstructed. No evidence for free intraperitoneal air or
organomegaly.
IMPRESSION: Nonobstructive bowel gas pattern.

## 2020-12-19 DIAGNOSIS — Z992 Dependence on renal dialysis: Secondary | ICD-10-CM | POA: Diagnosis not present

## 2020-12-19 DIAGNOSIS — D631 Anemia in chronic kidney disease: Secondary | ICD-10-CM | POA: Diagnosis not present

## 2020-12-19 DIAGNOSIS — N186 End stage renal disease: Secondary | ICD-10-CM | POA: Diagnosis not present

## 2020-12-19 DIAGNOSIS — D509 Iron deficiency anemia, unspecified: Secondary | ICD-10-CM | POA: Diagnosis not present

## 2020-12-22 DIAGNOSIS — E1165 Type 2 diabetes mellitus with hyperglycemia: Secondary | ICD-10-CM | POA: Diagnosis not present

## 2020-12-22 DIAGNOSIS — J449 Chronic obstructive pulmonary disease, unspecified: Secondary | ICD-10-CM | POA: Diagnosis not present

## 2020-12-22 DIAGNOSIS — C641 Malignant neoplasm of right kidney, except renal pelvis: Secondary | ICD-10-CM | POA: Diagnosis not present

## 2020-12-22 DIAGNOSIS — N186 End stage renal disease: Secondary | ICD-10-CM | POA: Diagnosis not present

## 2020-12-22 DIAGNOSIS — D509 Iron deficiency anemia, unspecified: Secondary | ICD-10-CM | POA: Diagnosis not present

## 2020-12-22 DIAGNOSIS — D631 Anemia in chronic kidney disease: Secondary | ICD-10-CM | POA: Diagnosis not present

## 2020-12-22 DIAGNOSIS — Z992 Dependence on renal dialysis: Secondary | ICD-10-CM | POA: Diagnosis not present

## 2020-12-22 DIAGNOSIS — E1122 Type 2 diabetes mellitus with diabetic chronic kidney disease: Secondary | ICD-10-CM | POA: Diagnosis not present

## 2020-12-22 DIAGNOSIS — I120 Hypertensive chronic kidney disease with stage 5 chronic kidney disease or end stage renal disease: Secondary | ICD-10-CM | POA: Diagnosis not present

## 2020-12-24 DIAGNOSIS — N186 End stage renal disease: Secondary | ICD-10-CM | POA: Diagnosis not present

## 2020-12-24 DIAGNOSIS — D509 Iron deficiency anemia, unspecified: Secondary | ICD-10-CM | POA: Diagnosis not present

## 2020-12-24 DIAGNOSIS — D631 Anemia in chronic kidney disease: Secondary | ICD-10-CM | POA: Diagnosis not present

## 2020-12-24 DIAGNOSIS — Z992 Dependence on renal dialysis: Secondary | ICD-10-CM | POA: Diagnosis not present

## 2020-12-26 DIAGNOSIS — Z992 Dependence on renal dialysis: Secondary | ICD-10-CM | POA: Diagnosis not present

## 2020-12-26 DIAGNOSIS — D631 Anemia in chronic kidney disease: Secondary | ICD-10-CM | POA: Diagnosis not present

## 2020-12-26 DIAGNOSIS — N186 End stage renal disease: Secondary | ICD-10-CM | POA: Diagnosis not present

## 2020-12-26 DIAGNOSIS — D509 Iron deficiency anemia, unspecified: Secondary | ICD-10-CM | POA: Diagnosis not present

## 2020-12-28 DIAGNOSIS — Z992 Dependence on renal dialysis: Secondary | ICD-10-CM | POA: Diagnosis not present

## 2020-12-28 DIAGNOSIS — N186 End stage renal disease: Secondary | ICD-10-CM | POA: Diagnosis not present

## 2020-12-28 DIAGNOSIS — Z794 Long term (current) use of insulin: Secondary | ICD-10-CM | POA: Diagnosis not present

## 2020-12-28 DIAGNOSIS — C641 Malignant neoplasm of right kidney, except renal pelvis: Secondary | ICD-10-CM | POA: Diagnosis not present

## 2020-12-28 DIAGNOSIS — J449 Chronic obstructive pulmonary disease, unspecified: Secondary | ICD-10-CM | POA: Diagnosis not present

## 2020-12-28 DIAGNOSIS — I120 Hypertensive chronic kidney disease with stage 5 chronic kidney disease or end stage renal disease: Secondary | ICD-10-CM | POA: Diagnosis not present

## 2020-12-28 DIAGNOSIS — Z87891 Personal history of nicotine dependence: Secondary | ICD-10-CM | POA: Diagnosis not present

## 2020-12-28 DIAGNOSIS — E1122 Type 2 diabetes mellitus with diabetic chronic kidney disease: Secondary | ICD-10-CM | POA: Diagnosis not present

## 2020-12-28 DIAGNOSIS — E1165 Type 2 diabetes mellitus with hyperglycemia: Secondary | ICD-10-CM | POA: Diagnosis not present

## 2020-12-29 DIAGNOSIS — D631 Anemia in chronic kidney disease: Secondary | ICD-10-CM | POA: Diagnosis not present

## 2020-12-29 DIAGNOSIS — D509 Iron deficiency anemia, unspecified: Secondary | ICD-10-CM | POA: Diagnosis not present

## 2020-12-29 DIAGNOSIS — N186 End stage renal disease: Secondary | ICD-10-CM | POA: Diagnosis not present

## 2020-12-29 DIAGNOSIS — Z992 Dependence on renal dialysis: Secondary | ICD-10-CM | POA: Diagnosis not present

## 2020-12-31 DIAGNOSIS — D631 Anemia in chronic kidney disease: Secondary | ICD-10-CM | POA: Diagnosis not present

## 2020-12-31 DIAGNOSIS — Z992 Dependence on renal dialysis: Secondary | ICD-10-CM | POA: Diagnosis not present

## 2020-12-31 DIAGNOSIS — N186 End stage renal disease: Secondary | ICD-10-CM | POA: Diagnosis not present

## 2020-12-31 DIAGNOSIS — D509 Iron deficiency anemia, unspecified: Secondary | ICD-10-CM | POA: Diagnosis not present

## 2021-01-01 DIAGNOSIS — C641 Malignant neoplasm of right kidney, except renal pelvis: Secondary | ICD-10-CM | POA: Diagnosis not present

## 2021-01-01 DIAGNOSIS — E1122 Type 2 diabetes mellitus with diabetic chronic kidney disease: Secondary | ICD-10-CM | POA: Diagnosis not present

## 2021-01-01 DIAGNOSIS — J449 Chronic obstructive pulmonary disease, unspecified: Secondary | ICD-10-CM | POA: Diagnosis not present

## 2021-01-01 DIAGNOSIS — N186 End stage renal disease: Secondary | ICD-10-CM | POA: Diagnosis not present

## 2021-01-01 DIAGNOSIS — E1165 Type 2 diabetes mellitus with hyperglycemia: Secondary | ICD-10-CM | POA: Diagnosis not present

## 2021-01-01 DIAGNOSIS — I120 Hypertensive chronic kidney disease with stage 5 chronic kidney disease or end stage renal disease: Secondary | ICD-10-CM | POA: Diagnosis not present

## 2021-01-02 DIAGNOSIS — N186 End stage renal disease: Secondary | ICD-10-CM | POA: Diagnosis not present

## 2021-01-02 DIAGNOSIS — D509 Iron deficiency anemia, unspecified: Secondary | ICD-10-CM | POA: Diagnosis not present

## 2021-01-02 DIAGNOSIS — D631 Anemia in chronic kidney disease: Secondary | ICD-10-CM | POA: Diagnosis not present

## 2021-01-02 DIAGNOSIS — Z992 Dependence on renal dialysis: Secondary | ICD-10-CM | POA: Diagnosis not present

## 2021-01-05 DIAGNOSIS — N186 End stage renal disease: Secondary | ICD-10-CM | POA: Diagnosis not present

## 2021-01-05 DIAGNOSIS — Z992 Dependence on renal dialysis: Secondary | ICD-10-CM | POA: Diagnosis not present

## 2021-01-05 DIAGNOSIS — D509 Iron deficiency anemia, unspecified: Secondary | ICD-10-CM | POA: Diagnosis not present

## 2021-01-05 DIAGNOSIS — M47819 Spondylosis without myelopathy or radiculopathy, site unspecified: Secondary | ICD-10-CM | POA: Diagnosis not present

## 2021-01-05 DIAGNOSIS — D631 Anemia in chronic kidney disease: Secondary | ICD-10-CM | POA: Diagnosis not present

## 2021-01-06 ENCOUNTER — Telehealth: Payer: Self-pay

## 2021-01-06 DIAGNOSIS — K5900 Constipation, unspecified: Secondary | ICD-10-CM | POA: Diagnosis not present

## 2021-01-06 DIAGNOSIS — I10 Essential (primary) hypertension: Secondary | ICD-10-CM | POA: Diagnosis not present

## 2021-01-06 DIAGNOSIS — Z299 Encounter for prophylactic measures, unspecified: Secondary | ICD-10-CM | POA: Diagnosis not present

## 2021-01-06 DIAGNOSIS — N2581 Secondary hyperparathyroidism of renal origin: Secondary | ICD-10-CM | POA: Diagnosis not present

## 2021-01-06 DIAGNOSIS — M549 Dorsalgia, unspecified: Secondary | ICD-10-CM | POA: Diagnosis not present

## 2021-01-06 DIAGNOSIS — Z6830 Body mass index (BMI) 30.0-30.9, adult: Secondary | ICD-10-CM | POA: Diagnosis not present

## 2021-01-06 DIAGNOSIS — E1165 Type 2 diabetes mellitus with hyperglycemia: Secondary | ICD-10-CM | POA: Diagnosis not present

## 2021-01-06 NOTE — Telephone Encounter (Signed)
Patient renal u/s not completed for tomorrows OV with Dr. Alyson Ingles.  email April Pait with scheduling to have completed.  appt rescheduled with wife and mailed.

## 2021-01-07 ENCOUNTER — Ambulatory Visit: Payer: Medicare Other | Admitting: Urology

## 2021-01-07 DIAGNOSIS — D631 Anemia in chronic kidney disease: Secondary | ICD-10-CM | POA: Diagnosis not present

## 2021-01-07 DIAGNOSIS — N186 End stage renal disease: Secondary | ICD-10-CM | POA: Diagnosis not present

## 2021-01-07 DIAGNOSIS — Z992 Dependence on renal dialysis: Secondary | ICD-10-CM | POA: Diagnosis not present

## 2021-01-07 DIAGNOSIS — D509 Iron deficiency anemia, unspecified: Secondary | ICD-10-CM | POA: Diagnosis not present

## 2021-01-09 DIAGNOSIS — E1165 Type 2 diabetes mellitus with hyperglycemia: Secondary | ICD-10-CM | POA: Diagnosis not present

## 2021-01-09 DIAGNOSIS — D509 Iron deficiency anemia, unspecified: Secondary | ICD-10-CM | POA: Diagnosis not present

## 2021-01-09 DIAGNOSIS — D631 Anemia in chronic kidney disease: Secondary | ICD-10-CM | POA: Diagnosis not present

## 2021-01-09 DIAGNOSIS — N186 End stage renal disease: Secondary | ICD-10-CM | POA: Diagnosis not present

## 2021-01-09 DIAGNOSIS — E1122 Type 2 diabetes mellitus with diabetic chronic kidney disease: Secondary | ICD-10-CM | POA: Diagnosis not present

## 2021-01-09 DIAGNOSIS — J449 Chronic obstructive pulmonary disease, unspecified: Secondary | ICD-10-CM | POA: Diagnosis not present

## 2021-01-09 DIAGNOSIS — C641 Malignant neoplasm of right kidney, except renal pelvis: Secondary | ICD-10-CM | POA: Diagnosis not present

## 2021-01-09 DIAGNOSIS — Z992 Dependence on renal dialysis: Secondary | ICD-10-CM | POA: Diagnosis not present

## 2021-01-09 DIAGNOSIS — I120 Hypertensive chronic kidney disease with stage 5 chronic kidney disease or end stage renal disease: Secondary | ICD-10-CM | POA: Diagnosis not present

## 2021-01-12 DIAGNOSIS — M5126 Other intervertebral disc displacement, lumbar region: Secondary | ICD-10-CM | POA: Diagnosis not present

## 2021-01-12 DIAGNOSIS — M48062 Spinal stenosis, lumbar region with neurogenic claudication: Secondary | ICD-10-CM | POA: Diagnosis not present

## 2021-01-12 DIAGNOSIS — D631 Anemia in chronic kidney disease: Secondary | ICD-10-CM | POA: Diagnosis not present

## 2021-01-12 DIAGNOSIS — D509 Iron deficiency anemia, unspecified: Secondary | ICD-10-CM | POA: Diagnosis not present

## 2021-01-12 DIAGNOSIS — Z992 Dependence on renal dialysis: Secondary | ICD-10-CM | POA: Diagnosis not present

## 2021-01-12 DIAGNOSIS — M47816 Spondylosis without myelopathy or radiculopathy, lumbar region: Secondary | ICD-10-CM | POA: Diagnosis not present

## 2021-01-12 DIAGNOSIS — M4316 Spondylolisthesis, lumbar region: Secondary | ICD-10-CM | POA: Diagnosis not present

## 2021-01-12 DIAGNOSIS — N186 End stage renal disease: Secondary | ICD-10-CM | POA: Diagnosis not present

## 2021-01-14 DIAGNOSIS — J449 Chronic obstructive pulmonary disease, unspecified: Secondary | ICD-10-CM | POA: Diagnosis not present

## 2021-01-14 DIAGNOSIS — D509 Iron deficiency anemia, unspecified: Secondary | ICD-10-CM | POA: Diagnosis not present

## 2021-01-14 DIAGNOSIS — D631 Anemia in chronic kidney disease: Secondary | ICD-10-CM | POA: Diagnosis not present

## 2021-01-14 DIAGNOSIS — I120 Hypertensive chronic kidney disease with stage 5 chronic kidney disease or end stage renal disease: Secondary | ICD-10-CM | POA: Diagnosis not present

## 2021-01-14 DIAGNOSIS — N186 End stage renal disease: Secondary | ICD-10-CM | POA: Diagnosis not present

## 2021-01-14 DIAGNOSIS — C641 Malignant neoplasm of right kidney, except renal pelvis: Secondary | ICD-10-CM | POA: Diagnosis not present

## 2021-01-14 DIAGNOSIS — E1122 Type 2 diabetes mellitus with diabetic chronic kidney disease: Secondary | ICD-10-CM | POA: Diagnosis not present

## 2021-01-14 DIAGNOSIS — Z992 Dependence on renal dialysis: Secondary | ICD-10-CM | POA: Diagnosis not present

## 2021-01-14 DIAGNOSIS — E1165 Type 2 diabetes mellitus with hyperglycemia: Secondary | ICD-10-CM | POA: Diagnosis not present

## 2021-01-15 ENCOUNTER — Telehealth: Payer: Self-pay | Admitting: Cardiology

## 2021-01-15 NOTE — Telephone Encounter (Signed)
   Primary Cardiologist: Carlyle Dolly, MD  Chart reviewed as part of pre-operative protocol coverage. Given past medical history and time since last visit, based on ACC/AHA guidelines, Todd Robertson would be at acceptable risk for the planned procedure without further cardiovascular testing.   Patient is not currently on any anticoagulation.  No medications need to be held prior to his surgery.  I will route this recommendation to the requesting party via Epic fax function and remove from pre-op pool.  Please call with questions.  Jossie Ng. Carter Kaman NP-C    01/15/2021, 4:23 PM [facility] [address] Office [phone] Fax [phone]

## 2021-01-15 NOTE — Telephone Encounter (Signed)
   [facility] Pre-operative Risk Assessment    HEARTCARE STAFF: - Please ensure there is not already an duplicate clearance open for this procedure. - Under Visit Info/Reason for Call, type in Other and utilize the format Clearance MM/DD/YY or Clearance TBD. Do not use dashes or single digits. - If request is for dental extraction, please clarify the # of teeth to be extracted.  Request for surgical clearance:  1. What type of surgery is being performed? Bilateral laminectomy and microdiscectomy at L4/5  2. When is this surgery scheduled? 02/04/21  3. What type of clearance is required (medical clearance vs. Pharmacy clearance to hold med vs. Both)? both  4. Are there any medications that need to be held prior to surgery and how long?  Please advise   5. Practice name and name of physician performing surgery? [facility]   6. What is the office phone number?[phone]   7.   What is the office fax number?  [phone]   8.   Anesthesia type (None, local, MAC, general) ? Not specified    Todd Robertson 01/15/2021, 4:01 PM  _________________________________________________________________   (provider comments below)

## 2021-01-16 ENCOUNTER — Telehealth: Payer: Self-pay

## 2021-01-16 DIAGNOSIS — N186 End stage renal disease: Secondary | ICD-10-CM | POA: Diagnosis not present

## 2021-01-16 DIAGNOSIS — D509 Iron deficiency anemia, unspecified: Secondary | ICD-10-CM | POA: Diagnosis not present

## 2021-01-16 DIAGNOSIS — Z992 Dependence on renal dialysis: Secondary | ICD-10-CM | POA: Diagnosis not present

## 2021-01-16 DIAGNOSIS — D631 Anemia in chronic kidney disease: Secondary | ICD-10-CM | POA: Diagnosis not present

## 2021-01-16 NOTE — Telephone Encounter (Signed)
Papers received from orthopedic surgeron for medical clearance from Dr. Alyson Ingles to proceed with back surgery.   Papers placed in MD office to complete. Will have MD complete Monday, March 14th. Wife called and made aware.

## 2021-01-18 ENCOUNTER — Other Ambulatory Visit: Payer: Self-pay | Admitting: Cardiology

## 2021-01-19 ENCOUNTER — Telehealth: Payer: Self-pay

## 2021-01-19 DIAGNOSIS — D631 Anemia in chronic kidney disease: Secondary | ICD-10-CM | POA: Diagnosis not present

## 2021-01-19 DIAGNOSIS — N186 End stage renal disease: Secondary | ICD-10-CM | POA: Diagnosis not present

## 2021-01-19 DIAGNOSIS — Z992 Dependence on renal dialysis: Secondary | ICD-10-CM | POA: Diagnosis not present

## 2021-01-19 DIAGNOSIS — D509 Iron deficiency anemia, unspecified: Secondary | ICD-10-CM | POA: Diagnosis not present

## 2021-01-19 NOTE — Telephone Encounter (Signed)
Received clearance letter from [facility] to have surgery.   Dr. Alyson Ingles completed and faxed back with recommendations to have patient PCP and [facility] complete one as well.  Faxed sheet back to [facility].

## 2021-01-21 DIAGNOSIS — N186 End stage renal disease: Secondary | ICD-10-CM | POA: Diagnosis not present

## 2021-01-21 DIAGNOSIS — D631 Anemia in chronic kidney disease: Secondary | ICD-10-CM | POA: Diagnosis not present

## 2021-01-21 DIAGNOSIS — D509 Iron deficiency anemia, unspecified: Secondary | ICD-10-CM | POA: Diagnosis not present

## 2021-01-21 DIAGNOSIS — Z992 Dependence on renal dialysis: Secondary | ICD-10-CM | POA: Diagnosis not present

## 2021-01-22 DIAGNOSIS — N186 End stage renal disease: Secondary | ICD-10-CM | POA: Diagnosis not present

## 2021-01-22 DIAGNOSIS — E1122 Type 2 diabetes mellitus with diabetic chronic kidney disease: Secondary | ICD-10-CM | POA: Diagnosis not present

## 2021-01-22 DIAGNOSIS — C641 Malignant neoplasm of right kidney, except renal pelvis: Secondary | ICD-10-CM | POA: Diagnosis not present

## 2021-01-22 DIAGNOSIS — E1165 Type 2 diabetes mellitus with hyperglycemia: Secondary | ICD-10-CM | POA: Diagnosis not present

## 2021-01-22 DIAGNOSIS — I120 Hypertensive chronic kidney disease with stage 5 chronic kidney disease or end stage renal disease: Secondary | ICD-10-CM | POA: Diagnosis not present

## 2021-01-22 DIAGNOSIS — J449 Chronic obstructive pulmonary disease, unspecified: Secondary | ICD-10-CM | POA: Diagnosis not present

## 2021-01-23 DIAGNOSIS — Z992 Dependence on renal dialysis: Secondary | ICD-10-CM | POA: Diagnosis not present

## 2021-01-23 DIAGNOSIS — D509 Iron deficiency anemia, unspecified: Secondary | ICD-10-CM | POA: Diagnosis not present

## 2021-01-23 DIAGNOSIS — N186 End stage renal disease: Secondary | ICD-10-CM | POA: Diagnosis not present

## 2021-01-23 DIAGNOSIS — D631 Anemia in chronic kidney disease: Secondary | ICD-10-CM | POA: Diagnosis not present

## 2021-01-26 DIAGNOSIS — N186 End stage renal disease: Secondary | ICD-10-CM | POA: Diagnosis not present

## 2021-01-26 DIAGNOSIS — D509 Iron deficiency anemia, unspecified: Secondary | ICD-10-CM | POA: Diagnosis not present

## 2021-01-26 DIAGNOSIS — D631 Anemia in chronic kidney disease: Secondary | ICD-10-CM | POA: Diagnosis not present

## 2021-01-26 DIAGNOSIS — Z992 Dependence on renal dialysis: Secondary | ICD-10-CM | POA: Diagnosis not present

## 2021-01-27 DIAGNOSIS — Z01818 Encounter for other preprocedural examination: Secondary | ICD-10-CM | POA: Diagnosis not present

## 2021-01-27 DIAGNOSIS — Z8546 Personal history of malignant neoplasm of prostate: Secondary | ICD-10-CM | POA: Diagnosis not present

## 2021-01-27 DIAGNOSIS — D539 Nutritional anemia, unspecified: Secondary | ICD-10-CM | POA: Diagnosis not present

## 2021-01-27 DIAGNOSIS — E213 Hyperparathyroidism, unspecified: Secondary | ICD-10-CM | POA: Diagnosis not present

## 2021-01-27 DIAGNOSIS — J449 Chronic obstructive pulmonary disease, unspecified: Secondary | ICD-10-CM | POA: Diagnosis not present

## 2021-01-27 DIAGNOSIS — D649 Anemia, unspecified: Secondary | ICD-10-CM | POA: Diagnosis not present

## 2021-01-27 DIAGNOSIS — M5116 Intervertebral disc disorders with radiculopathy, lumbar region: Secondary | ICD-10-CM | POA: Diagnosis not present

## 2021-01-27 DIAGNOSIS — Z01811 Encounter for preprocedural respiratory examination: Secondary | ICD-10-CM | POA: Diagnosis not present

## 2021-01-27 DIAGNOSIS — I10 Essential (primary) hypertension: Secondary | ICD-10-CM | POA: Diagnosis not present

## 2021-01-27 DIAGNOSIS — E119 Type 2 diabetes mellitus without complications: Secondary | ICD-10-CM | POA: Diagnosis not present

## 2021-01-27 DIAGNOSIS — Z01810 Encounter for preprocedural cardiovascular examination: Secondary | ICD-10-CM | POA: Diagnosis not present

## 2021-01-27 DIAGNOSIS — E785 Hyperlipidemia, unspecified: Secondary | ICD-10-CM | POA: Diagnosis not present

## 2021-01-27 DIAGNOSIS — I428 Other cardiomyopathies: Secondary | ICD-10-CM | POA: Diagnosis not present

## 2021-01-27 DIAGNOSIS — I4891 Unspecified atrial fibrillation: Secondary | ICD-10-CM | POA: Diagnosis not present

## 2021-01-27 DIAGNOSIS — M48062 Spinal stenosis, lumbar region with neurogenic claudication: Secondary | ICD-10-CM | POA: Diagnosis not present

## 2021-01-27 DIAGNOSIS — N186 End stage renal disease: Secondary | ICD-10-CM | POA: Diagnosis not present

## 2021-01-27 DIAGNOSIS — R791 Abnormal coagulation profile: Secondary | ICD-10-CM | POA: Diagnosis not present

## 2021-01-27 DIAGNOSIS — Z79899 Other long term (current) drug therapy: Secondary | ICD-10-CM | POA: Diagnosis not present

## 2021-01-28 DIAGNOSIS — Z992 Dependence on renal dialysis: Secondary | ICD-10-CM | POA: Diagnosis not present

## 2021-01-28 DIAGNOSIS — N186 End stage renal disease: Secondary | ICD-10-CM | POA: Diagnosis not present

## 2021-01-28 DIAGNOSIS — D631 Anemia in chronic kidney disease: Secondary | ICD-10-CM | POA: Diagnosis not present

## 2021-01-28 DIAGNOSIS — D509 Iron deficiency anemia, unspecified: Secondary | ICD-10-CM | POA: Diagnosis not present

## 2021-01-30 DIAGNOSIS — D509 Iron deficiency anemia, unspecified: Secondary | ICD-10-CM | POA: Diagnosis not present

## 2021-01-30 DIAGNOSIS — N186 End stage renal disease: Secondary | ICD-10-CM | POA: Diagnosis not present

## 2021-01-30 DIAGNOSIS — Z992 Dependence on renal dialysis: Secondary | ICD-10-CM | POA: Diagnosis not present

## 2021-01-30 DIAGNOSIS — D631 Anemia in chronic kidney disease: Secondary | ICD-10-CM | POA: Diagnosis not present

## 2021-02-02 ENCOUNTER — Ambulatory Visit (HOSPITAL_COMMUNITY)
Admission: RE | Admit: 2021-02-02 | Discharge: 2021-02-02 | Disposition: A | Discharge status: Home / Self Care | Payer: Medicare Other | Source: Ambulatory Visit | Attending: Urology | Admitting: Urology

## 2021-02-02 DIAGNOSIS — C641 Malignant neoplasm of right kidney, except renal pelvis: Secondary | ICD-10-CM | POA: Diagnosis not present

## 2021-02-02 DIAGNOSIS — D509 Iron deficiency anemia, unspecified: Secondary | ICD-10-CM | POA: Diagnosis not present

## 2021-02-02 DIAGNOSIS — Z992 Dependence on renal dialysis: Secondary | ICD-10-CM | POA: Diagnosis not present

## 2021-02-02 DIAGNOSIS — B351 Tinea unguium: Secondary | ICD-10-CM | POA: Diagnosis not present

## 2021-02-02 DIAGNOSIS — Z905 Acquired absence of kidney: Secondary | ICD-10-CM | POA: Diagnosis not present

## 2021-02-02 DIAGNOSIS — L110 Acquired keratosis follicularis: Secondary | ICD-10-CM | POA: Diagnosis not present

## 2021-02-02 DIAGNOSIS — E1151 Type 2 diabetes mellitus with diabetic peripheral angiopathy without gangrene: Secondary | ICD-10-CM | POA: Diagnosis not present

## 2021-02-02 DIAGNOSIS — E1140 Type 2 diabetes mellitus with diabetic neuropathy, unspecified: Secondary | ICD-10-CM | POA: Diagnosis not present

## 2021-02-02 DIAGNOSIS — N281 Cyst of kidney, acquired: Secondary | ICD-10-CM | POA: Diagnosis not present

## 2021-02-02 DIAGNOSIS — E119 Type 2 diabetes mellitus without complications: Secondary | ICD-10-CM | POA: Diagnosis not present

## 2021-02-02 DIAGNOSIS — N186 End stage renal disease: Secondary | ICD-10-CM | POA: Diagnosis not present

## 2021-02-02 DIAGNOSIS — D631 Anemia in chronic kidney disease: Secondary | ICD-10-CM | POA: Diagnosis not present

## 2021-02-03 DIAGNOSIS — N186 End stage renal disease: Secondary | ICD-10-CM | POA: Diagnosis not present

## 2021-02-03 DIAGNOSIS — Z992 Dependence on renal dialysis: Secondary | ICD-10-CM | POA: Diagnosis not present

## 2021-02-03 DIAGNOSIS — D509 Iron deficiency anemia, unspecified: Secondary | ICD-10-CM | POA: Diagnosis not present

## 2021-02-03 DIAGNOSIS — D631 Anemia in chronic kidney disease: Secondary | ICD-10-CM | POA: Diagnosis not present

## 2021-02-04 DIAGNOSIS — I10 Essential (primary) hypertension: Secondary | ICD-10-CM | POA: Diagnosis not present

## 2021-02-04 DIAGNOSIS — M961 Postlaminectomy syndrome, not elsewhere classified: Secondary | ICD-10-CM | POA: Diagnosis not present

## 2021-02-04 DIAGNOSIS — I428 Other cardiomyopathies: Secondary | ICD-10-CM | POA: Diagnosis not present

## 2021-02-04 DIAGNOSIS — Z992 Dependence on renal dialysis: Secondary | ICD-10-CM | POA: Diagnosis not present

## 2021-02-04 DIAGNOSIS — J449 Chronic obstructive pulmonary disease, unspecified: Secondary | ICD-10-CM | POA: Diagnosis not present

## 2021-02-04 DIAGNOSIS — Z6829 Body mass index (BMI) 29.0-29.9, adult: Secondary | ICD-10-CM | POA: Diagnosis not present

## 2021-02-04 DIAGNOSIS — E669 Obesity, unspecified: Secondary | ICD-10-CM | POA: Diagnosis not present

## 2021-02-04 DIAGNOSIS — Z794 Long term (current) use of insulin: Secondary | ICD-10-CM | POA: Diagnosis not present

## 2021-02-04 DIAGNOSIS — E785 Hyperlipidemia, unspecified: Secondary | ICD-10-CM | POA: Diagnosis not present

## 2021-02-04 DIAGNOSIS — M5416 Radiculopathy, lumbar region: Secondary | ICD-10-CM | POA: Diagnosis not present

## 2021-02-04 DIAGNOSIS — Z85528 Personal history of other malignant neoplasm of kidney: Secondary | ICD-10-CM | POA: Diagnosis not present

## 2021-02-04 DIAGNOSIS — E119 Type 2 diabetes mellitus without complications: Secondary | ICD-10-CM | POA: Diagnosis not present

## 2021-02-04 DIAGNOSIS — Z79899 Other long term (current) drug therapy: Secondary | ICD-10-CM | POA: Diagnosis not present

## 2021-02-04 DIAGNOSIS — Z87891 Personal history of nicotine dependence: Secondary | ICD-10-CM | POA: Diagnosis not present

## 2021-02-05 DIAGNOSIS — Z992 Dependence on renal dialysis: Secondary | ICD-10-CM | POA: Diagnosis not present

## 2021-02-05 DIAGNOSIS — N186 End stage renal disease: Secondary | ICD-10-CM | POA: Diagnosis not present

## 2021-02-06 DIAGNOSIS — N186 End stage renal disease: Secondary | ICD-10-CM | POA: Diagnosis not present

## 2021-02-06 DIAGNOSIS — Z992 Dependence on renal dialysis: Secondary | ICD-10-CM | POA: Diagnosis not present

## 2021-02-06 DIAGNOSIS — D631 Anemia in chronic kidney disease: Secondary | ICD-10-CM | POA: Diagnosis not present

## 2021-02-06 DIAGNOSIS — E559 Vitamin D deficiency, unspecified: Secondary | ICD-10-CM | POA: Diagnosis not present

## 2021-02-06 DIAGNOSIS — D509 Iron deficiency anemia, unspecified: Secondary | ICD-10-CM | POA: Diagnosis not present

## 2021-02-06 DIAGNOSIS — Z23 Encounter for immunization: Secondary | ICD-10-CM | POA: Diagnosis not present

## 2021-02-09 DIAGNOSIS — Z992 Dependence on renal dialysis: Secondary | ICD-10-CM | POA: Diagnosis not present

## 2021-02-09 DIAGNOSIS — N186 End stage renal disease: Secondary | ICD-10-CM | POA: Diagnosis not present

## 2021-02-09 DIAGNOSIS — D509 Iron deficiency anemia, unspecified: Secondary | ICD-10-CM | POA: Diagnosis not present

## 2021-02-09 DIAGNOSIS — Z23 Encounter for immunization: Secondary | ICD-10-CM | POA: Diagnosis not present

## 2021-02-09 DIAGNOSIS — D631 Anemia in chronic kidney disease: Secondary | ICD-10-CM | POA: Diagnosis not present

## 2021-02-09 DIAGNOSIS — E559 Vitamin D deficiency, unspecified: Secondary | ICD-10-CM | POA: Diagnosis not present

## 2021-02-11 DIAGNOSIS — D631 Anemia in chronic kidney disease: Secondary | ICD-10-CM | POA: Diagnosis not present

## 2021-02-11 DIAGNOSIS — D509 Iron deficiency anemia, unspecified: Secondary | ICD-10-CM | POA: Diagnosis not present

## 2021-02-11 DIAGNOSIS — E559 Vitamin D deficiency, unspecified: Secondary | ICD-10-CM | POA: Diagnosis not present

## 2021-02-11 DIAGNOSIS — Z23 Encounter for immunization: Secondary | ICD-10-CM | POA: Diagnosis not present

## 2021-02-11 DIAGNOSIS — N186 End stage renal disease: Secondary | ICD-10-CM | POA: Diagnosis not present

## 2021-02-11 DIAGNOSIS — Z992 Dependence on renal dialysis: Secondary | ICD-10-CM | POA: Diagnosis not present

## 2021-02-13 DIAGNOSIS — E559 Vitamin D deficiency, unspecified: Secondary | ICD-10-CM | POA: Diagnosis not present

## 2021-02-13 DIAGNOSIS — Z23 Encounter for immunization: Secondary | ICD-10-CM | POA: Diagnosis not present

## 2021-02-13 DIAGNOSIS — Z992 Dependence on renal dialysis: Secondary | ICD-10-CM | POA: Diagnosis not present

## 2021-02-13 DIAGNOSIS — D509 Iron deficiency anemia, unspecified: Secondary | ICD-10-CM | POA: Diagnosis not present

## 2021-02-13 DIAGNOSIS — N186 End stage renal disease: Secondary | ICD-10-CM | POA: Diagnosis not present

## 2021-02-13 DIAGNOSIS — D631 Anemia in chronic kidney disease: Secondary | ICD-10-CM | POA: Diagnosis not present

## 2021-02-16 DIAGNOSIS — E559 Vitamin D deficiency, unspecified: Secondary | ICD-10-CM | POA: Diagnosis not present

## 2021-02-16 DIAGNOSIS — Z992 Dependence on renal dialysis: Secondary | ICD-10-CM | POA: Diagnosis not present

## 2021-02-16 DIAGNOSIS — D631 Anemia in chronic kidney disease: Secondary | ICD-10-CM | POA: Diagnosis not present

## 2021-02-16 DIAGNOSIS — Z23 Encounter for immunization: Secondary | ICD-10-CM | POA: Diagnosis not present

## 2021-02-16 DIAGNOSIS — N186 End stage renal disease: Secondary | ICD-10-CM | POA: Diagnosis not present

## 2021-02-16 DIAGNOSIS — D509 Iron deficiency anemia, unspecified: Secondary | ICD-10-CM | POA: Diagnosis not present

## 2021-02-18 DIAGNOSIS — E559 Vitamin D deficiency, unspecified: Secondary | ICD-10-CM | POA: Diagnosis not present

## 2021-02-18 DIAGNOSIS — Z23 Encounter for immunization: Secondary | ICD-10-CM | POA: Diagnosis not present

## 2021-02-18 DIAGNOSIS — N186 End stage renal disease: Secondary | ICD-10-CM | POA: Diagnosis not present

## 2021-02-18 DIAGNOSIS — D631 Anemia in chronic kidney disease: Secondary | ICD-10-CM | POA: Diagnosis not present

## 2021-02-18 DIAGNOSIS — D509 Iron deficiency anemia, unspecified: Secondary | ICD-10-CM | POA: Diagnosis not present

## 2021-02-18 DIAGNOSIS — Z992 Dependence on renal dialysis: Secondary | ICD-10-CM | POA: Diagnosis not present

## 2021-02-20 DIAGNOSIS — D631 Anemia in chronic kidney disease: Secondary | ICD-10-CM | POA: Diagnosis not present

## 2021-02-20 DIAGNOSIS — Z992 Dependence on renal dialysis: Secondary | ICD-10-CM | POA: Diagnosis not present

## 2021-02-20 DIAGNOSIS — N186 End stage renal disease: Secondary | ICD-10-CM | POA: Diagnosis not present

## 2021-02-20 DIAGNOSIS — Z23 Encounter for immunization: Secondary | ICD-10-CM | POA: Diagnosis not present

## 2021-02-20 DIAGNOSIS — E559 Vitamin D deficiency, unspecified: Secondary | ICD-10-CM | POA: Diagnosis not present

## 2021-02-20 DIAGNOSIS — D509 Iron deficiency anemia, unspecified: Secondary | ICD-10-CM | POA: Diagnosis not present

## 2021-02-23 DIAGNOSIS — M47816 Spondylosis without myelopathy or radiculopathy, lumbar region: Secondary | ICD-10-CM | POA: Diagnosis not present

## 2021-02-23 DIAGNOSIS — D631 Anemia in chronic kidney disease: Secondary | ICD-10-CM | POA: Diagnosis not present

## 2021-02-23 DIAGNOSIS — M79675 Pain in left toe(s): Secondary | ICD-10-CM | POA: Diagnosis not present

## 2021-02-23 DIAGNOSIS — Z992 Dependence on renal dialysis: Secondary | ICD-10-CM | POA: Diagnosis not present

## 2021-02-23 DIAGNOSIS — M5136 Other intervertebral disc degeneration, lumbar region: Secondary | ICD-10-CM | POA: Diagnosis not present

## 2021-02-23 DIAGNOSIS — M79672 Pain in left foot: Secondary | ICD-10-CM | POA: Diagnosis not present

## 2021-02-23 DIAGNOSIS — Z09 Encounter for follow-up examination after completed treatment for conditions other than malignant neoplasm: Secondary | ICD-10-CM | POA: Diagnosis not present

## 2021-02-23 DIAGNOSIS — E559 Vitamin D deficiency, unspecified: Secondary | ICD-10-CM | POA: Diagnosis not present

## 2021-02-23 DIAGNOSIS — L600 Ingrowing nail: Secondary | ICD-10-CM | POA: Diagnosis not present

## 2021-02-23 DIAGNOSIS — N186 End stage renal disease: Secondary | ICD-10-CM | POA: Diagnosis not present

## 2021-02-23 DIAGNOSIS — Z23 Encounter for immunization: Secondary | ICD-10-CM | POA: Diagnosis not present

## 2021-02-23 DIAGNOSIS — D509 Iron deficiency anemia, unspecified: Secondary | ICD-10-CM | POA: Diagnosis not present

## 2021-02-23 DIAGNOSIS — M5416 Radiculopathy, lumbar region: Secondary | ICD-10-CM | POA: Diagnosis not present

## 2021-02-24 ENCOUNTER — Ambulatory Visit (INDEPENDENT_AMBULATORY_CARE_PROVIDER_SITE_OTHER): Payer: Medicare Other | Admitting: Urology

## 2021-02-24 ENCOUNTER — Other Ambulatory Visit: Payer: Self-pay

## 2021-02-24 ENCOUNTER — Encounter: Payer: Self-pay | Admitting: Urology

## 2021-02-24 VITALS — BP 158/87 | HR 96 | Temp 98.5°F | Ht 74.0 in | Wt 228.0 lb

## 2021-02-24 DIAGNOSIS — C641 Malignant neoplasm of right kidney, except renal pelvis: Secondary | ICD-10-CM | POA: Diagnosis not present

## 2021-02-24 DIAGNOSIS — N3001 Acute cystitis with hematuria: Secondary | ICD-10-CM | POA: Diagnosis not present

## 2021-02-24 LAB — BLADDER SCAN AMB NON-IMAGING: Scan Result: 191

## 2021-02-24 MED ORDER — CEFUROXIME AXETIL 250 MG PO TABS: 250.0000 mg | ORAL_TABLET | Freq: Every day | ORAL | 0 refills | Status: DC

## 2021-02-24 NOTE — Progress Notes (Signed)
02/24/2021 2:29 PM   Todd Robertson 1945/11/19 [id number]  Referring provider: Monico Blitz, MD [address] [place],  [place] [address]  Followup renal cell carcinoma  HPI: Todd Robertson is a 75yo here for followup for right RCC. Renal US 02/03/2021 shows 2 left renal cysts. He is ESRD on hemodialysis. No issues with dialysis. He notes worsening dysuria, urinary frequency, urgency and a feeling of incomplete emptying for the past 2 weeks. UA is concerning for infection. No other associated symptoms. No exacerbating/alleviaiting events.   PMH: Past Medical History:  Diagnosis Date  . Arthritis   . Cancer [facility])    prostate  . Cardiomyopathy    secondary  . CKD (chronic kidney disease)   . DM2 (diabetes mellitus, type 2) ([place])   . Gout   . HTN (hypertension)    unspec  . Hypercholesterolemia   . Nonischemic cardiomyopathy ([place])    a. EF 40-50% by most recent 2-D echo b. EF 25% by cardiac cath in 2004  . Overweight(278.02)     Surgical History: Past Surgical History:  Procedure Laterality Date  . AV FISTULA PLACEMENT Left 06/06/2020   Procedure: LEFT ARM ARTERIOVENOUS (AV) GRAFT USING 45CM GORTEX;  Surgeon: [name], MD;  Location: [place];  Service: Vascular;  Laterality: Left;  . COLONOSCOPY    . HERNIA REPAIR    . INSERTION OF DIALYSIS CATHETER Right 06/06/2020   Procedure: INSERTION OF DIALYSIS CATHETER USING 23CM DOUBLE LUMEN CATHETER;  Surgeon: [name], MD;  Location: [place];  Service: Vascular;  Laterality: Right;  . IR FLUORO GUIDE CV LINE RIGHT  05/20/2020  . IR US GUIDE VASC ACCESS RIGHT  05/20/2020  . KNEE ARTHROPLASTY Left 08/08/2017   Procedure: LEFT TOTAL KNEE ARTHROPLASTY WITH COMPUTER NAVIGATION;  Surgeon: Rod Can, MD;  Location: [place];  Service: Orthopedics;  Laterality: Left;  Needs RNFA  . KNEE ARTHROPLASTY Right 11/17/2017   Procedure: RIGHT TOTAL KNEE ARTHROPLASTY WITH COMPUTER NAVIGATION;  Surgeon: Rod Can, MD;  [facility];   Service: Orthopedics;  Laterality: Right;  NEEDS RNFA  . ROBOT ASSISTED LAPAROSCOPIC NEPHRECTOMY Right 05/16/2020   Procedure: XI ROBOTIC ASSISTED LAPAROSCOPIC NEPHRECTOMY;  Surgeon: Cleon Gustin, MD;  [facility];  Service: Urology;  Laterality: Right;  2.5 hrs    Home Medications:  Allergies as of 02/24/2021      Reactions   No Known Allergies       Medication List       Accurate as of February 24, 2021  2:29 PM. If you have any questions, ask your nurse or doctor.        acetaminophen 325 MG tablet Commonly known as: TYLENOL Take 1-2 tablets (325-650 mg total) by mouth every 4 (four) hours as needed for mild pain.   albuterol 108 (90 Base) MCG/ACT inhaler Commonly known as: VENTOLIN HFA Inhale 1-2 puffs into the lungs every 6 (six) hours as needed for wheezing or shortness of breath.   allopurinol 300 MG tablet Commonly known as: ZYLOPRIM Take 300 mg by mouth daily.   atorvastatin 40 MG tablet Commonly known as: LIPITOR Take 1 tablet (40 mg total) by mouth daily.   budesonide-formoterol 160-4.5 MCG/ACT inhaler Commonly known as: SYMBICORT Inhale 2 puffs into the lungs 2 (two) times daily.   calcium acetate 667 MG capsule Commonly known as: PHOSLO Take 3 capsules (2,001 mg total) by mouth 3 (three) times daily with meals.   Centrum Silver 50+Men Tabs Take 1 tablet  by mouth daily.   cetirizine 10 MG tablet Commonly known as: ZYRTEC Take by mouth.   HYDROcodone-acetaminophen 10-325 MG tablet Commonly known as: NORCO Take 1 tablet by mouth 4 (four) times daily as needed.   hydrocortisone 10 MG tablet Commonly known as: CORTEF Take 1 tablet (10 mg total) by mouth 2 (two) times daily with a meal.   insulin lispro 100 UNIT/ML KwikPen Commonly known as: HUMALOG Inject into the skin.   linaclotide 290 MCG Caps capsule Commonly known as: LINZESS Take by mouth.   loratadine 10 MG tablet Commonly known as: CLARITIN Take 10 mg by mouth daily.    metoprolol tartrate 25 MG tablet Commonly known as: LOPRESSOR Take 0.5 tablets (12.5 mg total) by mouth 2 (two) times daily.   midodrine 10 MG tablet Commonly known as: PROAMATINE TAKE 1 TABLET BY MOUTH THREE TIMES DAILY WITH MEALS   multivitamin Tabs tablet Take 1 tablet by mouth at bedtime.   Oxycodone HCl 10 MG Tabs Take 10 mg by mouth every 8 (eight) hours as needed.   senna 8.6 MG tablet Commonly known as: SENOKOT Take 1 tablet by mouth daily as needed for constipation.   silver sulfADIAZINE 1 % cream Commonly known as: SILVADENE Apply to affected area daily   tiZANidine 2 MG tablet Commonly known as: ZANAFLEX Take by mouth.   Vitamin D 50 MCG (2000 UT) tablet Take 1 tablet (2,000 Units total) by mouth daily.       Allergies:  Allergies  Allergen Reactions  . No Known Allergies     Family History: Family History  Problem Relation Age of Onset  . Hypertension Mother   . Kidney disease Mother   . Heart disease Father   . Kidney disease Other     Social History:  reports that he quit smoking about 36 years ago. His smoking use included cigarettes. He started smoking about 55 years ago. He has a 10.00 pack-year smoking history. He has never used smokeless tobacco. He reports that he does not drink alcohol and does not use drugs.  ROS: All other review of systems were reviewed and are negative except what is noted above in HPI  Physical Exam: BP (!) 158/87   Pulse 96   Temp 98.5 F (36.9 C)   Ht 6\' 2"  (1.88 m)   Wt 228 lb (103.4 kg)   BMI 29.27 kg/m   Constitutional:  Alert and oriented, No acute distress. HEENT: [place] AT, moist mucus membranes.  Trachea midline, no masses. Cardiovascular: No clubbing, cyanosis, or edema. Respiratory: Normal respiratory effort, no increased work of breathing. GI: Abdomen is soft, nontender, nondistended, no abdominal masses GU: No CVA tenderness.  Lymph: No cervical or inguinal lymphadenopathy. Skin: No rashes,  bruises or suspicious lesions. Neurologic: Grossly intact, no focal deficits, moving all 4 extremities. Psychiatric: Normal mood and affect.  Laboratory Data: Lab Results  Component Value Date   WBC 13.5 (H) 06/24/2020   HGB 9.7 (L) 06/24/2020   HCT 32.2 (L) 06/24/2020   MCV 100.3 (H) 06/24/2020   PLT 183 06/24/2020    Lab Results  Component Value Date   CREATININE 11.00 (H) 06/24/2020    No results found for: PSA  No results found for: TESTOSTERONE  Lab Results  Component Value Date   HGBA1C 6.2 (H) 05/13/2020    Urinalysis    Component Value Date/Time   BILIRUBINUR neg 02/13/2020 1122   PROTEINUR Negative 02/13/2020 1122   UROBILINOGEN 0.2 02/13/2020 1122   NITRITE  neg 02/13/2020 1122   LEUKOCYTESUR Negative 02/13/2020 1122    No results found for: LABMICR, WBCUA, RBCUA, LABEPIT, MUCUS, BACTERIA  Pertinent Imaging: Renal US 02/03/2021: Images reviewed and discussed with the patient Results for orders placed during the hospital encounter of 06/10/20  DG Abd 1 View  Narrative CLINICAL DATA:  Abdominal distention.  EXAM: ABDOMEN - 1 VIEW  COMPARISON:  05/30/2020  FINDINGS: Nasogastric tube has been removed. Bowel gas pattern is nonobstructed. No evidence for free intraperitoneal air or organomegaly.  IMPRESSION: Nonobstructive bowel gas pattern.   Electronically Signed By: Nolon Nations M.D. On: 06/14/2020 10:14  No results found for this or any previous visit.  No results found for this or any previous visit.  No results found for this or any previous visit.  Results for orders placed during the hospital encounter of 02/02/21  Ultrasound renal complete  Narrative CLINICAL DATA:  Right nephrectomy.  EXAM: RENAL / URINARY TRACT ULTRASOUND COMPLETE  COMPARISON:  CT 05/28/2020.  FINDINGS: Right Kidney:  Renal measurements: Right nephrectomy.  Scratch  Left Kidney:  Renal measurements: 12.0 x 5.9 x 5.5 cm = volume: 196.0  mL. Echogenicity within normal limits. 2.6 and 1.4 cm simple cysts. No hydronephrosis visualized.  Bladder:  Appears normal for degree of bladder distention. Ureteral jets not visualized.  Other:  None.  IMPRESSION: 1. Right nephrectomy. 2. Two simple cysts left kidney. No solid mass. No hydronephrosis or bladder distention.   Electronically Signed By: Marcello Moores  Register On: 02/03/2021 06:19  No results found for this or any previous visit.  No results found for this or any previous visit.  Results for orders placed during the hospital encounter of 02/01/20  CT RENAL STONE STUDY  Narrative CLINICAL DATA:  Indeterminate right renal mass on recent ultrasound. Renal insufficiency.  EXAM: CT ABDOMEN AND PELVIS WITHOUT CONTRAST  TECHNIQUE: Multidetector CT imaging of the abdomen and pelvis was performed following the standard protocol without IV contrast.  COMPARISON:  12/25/2019 renal sonogram.  FINDINGS: Lower chest: No significant pulmonary nodules or acute consolidative airspace disease. Coronary atherosclerosis.  Hepatobiliary: Normal liver size. No liver mass. Normal gallbladder with no radiopaque cholelithiasis. No biliary ductal dilatation.  Pancreas: Normal, with no mass or duct dilation.  Spleen: Normal size. No mass.  Adrenals/Urinary Tract: Normal adrenals. Soft tissue density partially exophytic 6.4 x 5.4 cm renal cortical mass in the medial upper right kidney (series 2/image 24). Simple 2.8 cm posterior interpolar right renal cyst. Simple 1.7 cm anterior lower left renal cyst. No renal stones. No hydronephrosis. Normal caliber ureters. No ureteral stones. Normal bladder.  Stomach/Bowel: Normal non-distended stomach. Normal caliber small bowel with no small bowel wall thickening. Normal appendix. Scattered minimal colonic diverticulosis with no large bowel wall thickening or significant pericolonic fat stranding.  Vascular/Lymphatic:  Atherosclerotic nonaneurysmal abdominal aorta. No pathologically enlarged lymph nodes in the abdomen or pelvis.  Reproductive: Top-normal size prostate. Fiducial markers surrounding the prostate  Other: No pneumoperitoneum, ascites or focal fluid collection. Small fat containing umbilical hernia.  Musculoskeletal: No aggressive appearing focal osseous lesions. Marked lumbar spondylosis.  IMPRESSION: 1. Soft tissue density partially exophytic 6.4 cm renal cortical mass in the medial upper right kidney, incompletely characterized on this noncontrast study (patient unable to receive IV contrast due to GFR below 30). Renal cell carcinoma is to be excluded. If the patient's renal function improves (GFR greater than or equal to 30), renal mass protocol MRI (preferred) or CT abdomen without and with IV contrast is indicated  for further evaluation. 2. No evidence of metastatic disease in the abdomen or pelvis on this noncontrast CT study. 3. Coronary atherosclerosis. 4. Aortic Atherosclerosis (ICD10-I70.0).   Electronically Signed By: Ilona Sorrel M.D. On: 02/02/2020 16:52   Assessment & Plan:    1. Renal cell carcinoma of right kidney (HCC) -RTC 6 months with CT stone study - Urinalysis, Routine w reflex microscopic - BLADDER SCAN AMB NON-IMAGING  2. Acute cystitis -Urine for culture -ceftin 250mg  daily   No follow-ups on file.  Nicolette Bang, MD  [facility]

## 2021-02-24 NOTE — Patient Instructions (Addendum)
Urinary Tract Infection, Adult A urinary tract infection (UTI) is an infection of any part of the urinary tract. The urinary tract includes:  The kidneys.  The ureters.  The bladder.  The urethra. These organs make, store, and get rid of pee (urine) in the body. What are the causes? This infection is caused by germs (bacteria) in your genital area. These germs grow and cause swelling (inflammation) of your urinary tract. What increases the risk? The following factors may make you more likely to develop this condition:  Using a small, thin tube (catheter) to drain pee.  Not being able to control when you pee or poop (incontinence).  Being male. If you are male, these things can increase the risk: ? Using these methods to prevent pregnancy:  A medicine that kills sperm (spermicide).  A device that blocks sperm (diaphragm). ? Having low levels of a male hormone (estrogen). ? Being pregnant. You are more likely to develop this condition if:  You have genes that add to your risk.  You are sexually active.  You take antibiotic medicines.  You have trouble peeing because of: ? A prostate that is bigger than normal, if you are male. ? A blockage in the part of your body that drains pee from the bladder. ? A kidney stone. ? A nerve condition that affects your bladder. ? Not getting enough to drink. ? Not peeing often enough.  You have other conditions, such as: ? Diabetes. ? A weak disease-fighting system (immune system). ? Sickle cell disease. ? Gout. ? Injury of the spine. What are the signs or symptoms? Symptoms of this condition include:  Needing to pee right away.  Peeing small amounts often.  Pain or burning when peeing.  Blood in the pee.  Pee that smells bad or not like normal.  Trouble peeing.  Pee that is cloudy.  Fluid coming from the vagina, if you are male.  Pain in the belly or lower back. Other symptoms include:  Vomiting.  Not  feeling hungry.  Feeling mixed up (confused). This may be the first symptom in older adults.  Being tired and grouchy (irritable).  A fever.  Watery poop (diarrhea). How is this treated?  Taking antibiotic medicine.  Taking other medicines.  Drinking enough water. In some cases, you may need to see a specialist. Follow these instructions at home: Medicines  Take over-the-counter and prescription medicines only as told by your doctor.  If you were prescribed an antibiotic medicine, take it as told by your doctor. Do not stop taking it even if you start to feel better. General instructions  Make sure you: ? Pee until your bladder is empty. ? Do not hold pee for a long time. ? Empty your bladder after sex. ? Wipe from front to back after peeing or pooping if you are a male. Use each tissue one time when you wipe.  Drink enough fluid to keep your pee pale yellow.  Keep all follow-up visits.   Contact a doctor if:  You do not get better after 1-2 days.  Your symptoms go away and then come back. Get help right away if:  You have very bad back pain.  You have very bad pain in your lower belly.  You have a fever.  You have chills.  You feeling like you will vomit or you vomit. Summary  A urinary tract infection (UTI) is an infection of any part of the urinary tract.  This condition is caused by   germs in your genital area.  There are many risk factors for a UTI.  Treatment includes antibiotic medicines.  Drink enough fluid to keep your pee pale yellow. This information is not intended to replace advice given to you by your health care provider. Make sure you discuss any questions you have with your health care provider. Document Revised: 06/06/2020 Document Reviewed: 06/06/2020 Elsevier Patient Education  2021 [place].  Urinary Tract Infection, Adult  A urinary tract infection (UTI) is an infection of any part of the urinary tract. The urinary tract  includes the kidneys, ureters, bladder, and urethra. These organs make, store, and get rid of urine in the body. An upper UTI affects the ureters and kidneys. A lower UTI affects the bladder and urethra. What are the causes? Most urinary tract infections are caused by bacteria in your genital area around your urethra, where urine leaves your body. These bacteria grow and cause inflammation of your urinary tract. What increases the risk? You are more likely to develop this condition if:  You have a urinary catheter that stays in place.  You are not able to control when you urinate or have a bowel movement (incontinence).  You are male and you: ? Use a spermicide or diaphragm for birth control. ? Have low estrogen levels. ? Are pregnant.  You have certain genes that increase your risk.  You are sexually active.  You take antibiotic medicines.  You have a condition that causes your flow of urine to slow down, such as: ? An enlarged prostate, if you are male. ? Blockage in your urethra. ? A kidney stone. ? A nerve condition that affects your bladder control (neurogenic bladder). ? Not getting enough to drink, or not urinating often.  You have certain medical conditions, such as: ? Diabetes. ? A weak disease-fighting system (immunesystem). ? Sickle cell disease. ? Gout. ? Spinal cord injury. What are the signs or symptoms? Symptoms of this condition include:  Needing to urinate right away (urgency).  Frequent urination. This may include small amounts of urine each time you urinate.  Pain or burning with urination.  Blood in the urine.  Urine that smells bad or unusual.  Trouble urinating.  Cloudy urine.  Vaginal discharge, if you are male.  Pain in the abdomen or the lower back. You may also have:  Vomiting or a decreased appetite.  Confusion.  Irritability or tiredness.  A fever or chills.  Diarrhea. The first symptom in older adults may be confusion.  In some cases, they may not have any symptoms until the infection has worsened. How is this diagnosed? This condition is diagnosed based on your medical history and a physical exam. You may also have other tests, including:  Urine tests.  Blood tests.  Tests for STIs (sexually transmitted infections). If you have had more than one UTI, a cystoscopy or imaging studies may be done to determine the cause of the infections. How is this treated? Treatment for this condition includes:  Antibiotic medicine.  Over-the-counter medicines to treat discomfort.  Drinking enough water to stay hydrated. If you have frequent infections or have other conditions such as a kidney stone, you may need to see a health care provider who specializes in the urinary tract (urologist). In rare cases, urinary tract infections can cause sepsis. Sepsis is a life-threatening condition that occurs when the body responds to an infection. Sepsis is treated in the hospital with IV antibiotics, fluids, and other medicines. Follow these instructions at  home: Medicines  Take over-the-counter and prescription medicines only as told by your health care provider.  If you were prescribed an antibiotic medicine, take it as told by your health care provider. Do not stop using the antibiotic even if you start to feel better. General instructions  Make sure you: ? Empty your bladder often and completely. Do not hold urine for long periods of time. ? Empty your bladder after sex. ? Wipe from front to back after urinating or having a bowel movement if you are male. Use each tissue only one time when you wipe.  Drink enough fluid to keep your urine pale yellow.  Keep all follow-up visits. This is important.   Contact a health care provider if:  Your symptoms do not get better after 1-2 days.  Your symptoms go away and then return. Get help right away if:  You have severe pain in your back or your lower abdomen.  You  have a fever or chills.  You have nausea or vomiting. Summary  A urinary tract infection (UTI) is an infection of any part of the urinary tract, which includes the kidneys, ureters, bladder, and urethra.  Most urinary tract infections are caused by bacteria in your genital area.  Treatment for this condition often includes antibiotic medicines.  If you were prescribed an antibiotic medicine, take it as told by your health care provider. Do not stop using the antibiotic even if you start to feel better.  Keep all follow-up visits. This is important. This information is not intended to replace advice given to you by your health care provider. Make sure you discuss any questions you have with your health care provider. Document Revised: 06/06/2020 Document Reviewed: 06/06/2020 Elsevier Patient Education  [place].

## 2021-02-24 NOTE — Progress Notes (Signed)
post void residual= 191  Urological Symptom Review  Patient is experiencing the following symptoms: Burning/pain with urination Get up at night to urinate Leakage of urine Stream starts and stops Trouble starting stream Blood in urine Erection problems (male only)   Review of Systems  Gastrointestinal (upper)  : Negative for upper GI symptoms  Gastrointestinal (lower) : Constipation  Constitutional : Negative for symptoms  Skin: Itching  Eyes: Negative for eye symptoms  Ear/Nose/Throat : Negative for Ear/Nose/Throat symptoms  Hematologic/Lymphatic: Negative for Hematologic/Lymphatic symptoms  Cardiovascular : Leg swelling  Respiratory : Cough  Endocrine: Negative for endocrine symptoms  Musculoskeletal: Negative for musculoskeletal symptoms  Neurological: Negative for neurological symptoms  Psychologic: Negative for psychiatric symptoms

## 2021-02-25 DIAGNOSIS — E559 Vitamin D deficiency, unspecified: Secondary | ICD-10-CM | POA: Diagnosis not present

## 2021-02-25 DIAGNOSIS — Z23 Encounter for immunization: Secondary | ICD-10-CM | POA: Diagnosis not present

## 2021-02-25 DIAGNOSIS — N186 End stage renal disease: Secondary | ICD-10-CM | POA: Diagnosis not present

## 2021-02-25 DIAGNOSIS — D631 Anemia in chronic kidney disease: Secondary | ICD-10-CM | POA: Diagnosis not present

## 2021-02-25 DIAGNOSIS — Z992 Dependence on renal dialysis: Secondary | ICD-10-CM | POA: Diagnosis not present

## 2021-02-25 DIAGNOSIS — D509 Iron deficiency anemia, unspecified: Secondary | ICD-10-CM | POA: Diagnosis not present

## 2021-02-25 LAB — URINALYSIS, ROUTINE W REFLEX MICROSCOPIC
Bilirubin, UA: NEGATIVE
Glucose, UA: NEGATIVE
Ketones, UA: NEGATIVE
Nitrite, UA: NEGATIVE
Specific Gravity, UA: 1.02 (ref 1.005–1.030)
Urobilinogen, Ur: 0.2 mg/dL (ref 0.2–1.0)
pH, UA: 7 (ref 5.0–7.5)

## 2021-02-25 LAB — MICROSCOPIC EXAMINATION: WBC, UA: >30 /hpf — ABNORMAL HIGH (ref 0–5)

## 2021-02-26 DIAGNOSIS — M48062 Spinal stenosis, lumbar region with neurogenic claudication: Secondary | ICD-10-CM | POA: Diagnosis not present

## 2021-02-26 DIAGNOSIS — Z981 Arthrodesis status: Secondary | ICD-10-CM | POA: Diagnosis not present

## 2021-02-26 DIAGNOSIS — M5116 Intervertebral disc disorders with radiculopathy, lumbar region: Secondary | ICD-10-CM | POA: Diagnosis not present

## 2021-02-26 DIAGNOSIS — Z992 Dependence on renal dialysis: Secondary | ICD-10-CM | POA: Diagnosis not present

## 2021-02-26 DIAGNOSIS — J449 Chronic obstructive pulmonary disease, unspecified: Secondary | ICD-10-CM | POA: Diagnosis not present

## 2021-02-26 DIAGNOSIS — N186 End stage renal disease: Secondary | ICD-10-CM | POA: Diagnosis not present

## 2021-02-26 DIAGNOSIS — Z4789 Encounter for other orthopedic aftercare: Secondary | ICD-10-CM | POA: Diagnosis not present

## 2021-02-26 DIAGNOSIS — Z87891 Personal history of nicotine dependence: Secondary | ICD-10-CM | POA: Diagnosis not present

## 2021-02-26 DIAGNOSIS — E1122 Type 2 diabetes mellitus with diabetic chronic kidney disease: Secondary | ICD-10-CM | POA: Diagnosis not present

## 2021-02-26 DIAGNOSIS — Z6829 Body mass index (BMI) 29.0-29.9, adult: Secondary | ICD-10-CM | POA: Diagnosis not present

## 2021-02-26 DIAGNOSIS — I120 Hypertensive chronic kidney disease with stage 5 chronic kidney disease or end stage renal disease: Secondary | ICD-10-CM | POA: Diagnosis not present

## 2021-02-26 DIAGNOSIS — Z794 Long term (current) use of insulin: Secondary | ICD-10-CM | POA: Diagnosis not present

## 2021-02-26 DIAGNOSIS — Z9181 History of falling: Secondary | ICD-10-CM | POA: Diagnosis not present

## 2021-02-26 DIAGNOSIS — M4727 Other spondylosis with radiculopathy, lumbosacral region: Secondary | ICD-10-CM | POA: Diagnosis not present

## 2021-02-26 LAB — URINE CULTURE

## 2021-02-27 DIAGNOSIS — Z992 Dependence on renal dialysis: Secondary | ICD-10-CM | POA: Diagnosis not present

## 2021-02-27 DIAGNOSIS — D509 Iron deficiency anemia, unspecified: Secondary | ICD-10-CM | POA: Diagnosis not present

## 2021-02-27 DIAGNOSIS — D631 Anemia in chronic kidney disease: Secondary | ICD-10-CM | POA: Diagnosis not present

## 2021-02-27 DIAGNOSIS — Z23 Encounter for immunization: Secondary | ICD-10-CM | POA: Diagnosis not present

## 2021-02-27 DIAGNOSIS — E559 Vitamin D deficiency, unspecified: Secondary | ICD-10-CM | POA: Diagnosis not present

## 2021-02-27 DIAGNOSIS — N186 End stage renal disease: Secondary | ICD-10-CM | POA: Diagnosis not present

## 2021-02-27 NOTE — Progress Notes (Signed)
Sent via mychart

## 2021-03-02 DIAGNOSIS — E1122 Type 2 diabetes mellitus with diabetic chronic kidney disease: Secondary | ICD-10-CM | POA: Diagnosis not present

## 2021-03-02 DIAGNOSIS — M4727 Other spondylosis with radiculopathy, lumbosacral region: Secondary | ICD-10-CM | POA: Diagnosis not present

## 2021-03-02 DIAGNOSIS — D631 Anemia in chronic kidney disease: Secondary | ICD-10-CM | POA: Diagnosis not present

## 2021-03-02 DIAGNOSIS — I120 Hypertensive chronic kidney disease with stage 5 chronic kidney disease or end stage renal disease: Secondary | ICD-10-CM | POA: Diagnosis not present

## 2021-03-02 DIAGNOSIS — M5116 Intervertebral disc disorders with radiculopathy, lumbar region: Secondary | ICD-10-CM | POA: Diagnosis not present

## 2021-03-02 DIAGNOSIS — M48062 Spinal stenosis, lumbar region with neurogenic claudication: Secondary | ICD-10-CM | POA: Diagnosis not present

## 2021-03-02 DIAGNOSIS — Z4789 Encounter for other orthopedic aftercare: Secondary | ICD-10-CM | POA: Diagnosis not present

## 2021-03-02 DIAGNOSIS — Z23 Encounter for immunization: Secondary | ICD-10-CM | POA: Diagnosis not present

## 2021-03-02 DIAGNOSIS — E559 Vitamin D deficiency, unspecified: Secondary | ICD-10-CM | POA: Diagnosis not present

## 2021-03-02 DIAGNOSIS — D509 Iron deficiency anemia, unspecified: Secondary | ICD-10-CM | POA: Diagnosis not present

## 2021-03-02 DIAGNOSIS — N186 End stage renal disease: Secondary | ICD-10-CM | POA: Diagnosis not present

## 2021-03-02 DIAGNOSIS — Z992 Dependence on renal dialysis: Secondary | ICD-10-CM | POA: Diagnosis not present

## 2021-03-04 DIAGNOSIS — Z23 Encounter for immunization: Secondary | ICD-10-CM | POA: Diagnosis not present

## 2021-03-04 DIAGNOSIS — Z992 Dependence on renal dialysis: Secondary | ICD-10-CM | POA: Diagnosis not present

## 2021-03-04 DIAGNOSIS — D631 Anemia in chronic kidney disease: Secondary | ICD-10-CM | POA: Diagnosis not present

## 2021-03-04 DIAGNOSIS — D509 Iron deficiency anemia, unspecified: Secondary | ICD-10-CM | POA: Diagnosis not present

## 2021-03-04 DIAGNOSIS — N186 End stage renal disease: Secondary | ICD-10-CM | POA: Diagnosis not present

## 2021-03-04 DIAGNOSIS — E559 Vitamin D deficiency, unspecified: Secondary | ICD-10-CM | POA: Diagnosis not present

## 2021-03-06 DIAGNOSIS — Z992 Dependence on renal dialysis: Secondary | ICD-10-CM | POA: Diagnosis not present

## 2021-03-06 DIAGNOSIS — N186 End stage renal disease: Secondary | ICD-10-CM | POA: Diagnosis not present

## 2021-03-06 DIAGNOSIS — E559 Vitamin D deficiency, unspecified: Secondary | ICD-10-CM | POA: Diagnosis not present

## 2021-03-06 DIAGNOSIS — D631 Anemia in chronic kidney disease: Secondary | ICD-10-CM | POA: Diagnosis not present

## 2021-03-06 DIAGNOSIS — D509 Iron deficiency anemia, unspecified: Secondary | ICD-10-CM | POA: Diagnosis not present

## 2021-03-06 DIAGNOSIS — Z23 Encounter for immunization: Secondary | ICD-10-CM | POA: Diagnosis not present

## 2021-03-06 DIAGNOSIS — N3001 Acute cystitis with hematuria: Secondary | ICD-10-CM | POA: Insufficient documentation

## 2021-03-07 DIAGNOSIS — Z992 Dependence on renal dialysis: Secondary | ICD-10-CM | POA: Diagnosis not present

## 2021-03-07 DIAGNOSIS — N186 End stage renal disease: Secondary | ICD-10-CM | POA: Diagnosis not present

## 2021-03-09 ENCOUNTER — Other Ambulatory Visit: Payer: Self-pay

## 2021-03-09 ENCOUNTER — Telehealth: Payer: Self-pay | Admitting: Urology

## 2021-03-09 DIAGNOSIS — N186 End stage renal disease: Secondary | ICD-10-CM | POA: Diagnosis not present

## 2021-03-09 DIAGNOSIS — D509 Iron deficiency anemia, unspecified: Secondary | ICD-10-CM | POA: Diagnosis not present

## 2021-03-09 DIAGNOSIS — N3001 Acute cystitis with hematuria: Secondary | ICD-10-CM

## 2021-03-09 DIAGNOSIS — Z992 Dependence on renal dialysis: Secondary | ICD-10-CM | POA: Diagnosis not present

## 2021-03-09 DIAGNOSIS — D631 Anemia in chronic kidney disease: Secondary | ICD-10-CM | POA: Diagnosis not present

## 2021-03-09 NOTE — Telephone Encounter (Signed)
Pt's wife called wanting to speak with someone about her husband. She said that when he goes to the bathroom it's like a drip and he's saying that he's hurting. Please call when you can.

## 2021-03-10 ENCOUNTER — Other Ambulatory Visit: Payer: Medicare Other

## 2021-03-10 ENCOUNTER — Other Ambulatory Visit: Payer: Self-pay

## 2021-03-10 DIAGNOSIS — N3001 Acute cystitis with hematuria: Secondary | ICD-10-CM | POA: Diagnosis not present

## 2021-03-10 LAB — URINALYSIS, ROUTINE W REFLEX MICROSCOPIC
Bilirubin, UA: NEGATIVE
Glucose, UA: NEGATIVE
Ketones, UA: NEGATIVE
Nitrite, UA: NEGATIVE
Specific Gravity, UA: 1.015 (ref 1.005–1.030)
Urobilinogen, Ur: 0.2 mg/dL (ref 0.2–1.0)
pH, UA: 7 (ref 5.0–7.5)

## 2021-03-10 LAB — MICROSCOPIC EXAMINATION
Renal Epithel, UA: NONE SEEN /hpf
WBC, UA: >30 /hpf — AB (ref 0–5)

## 2021-03-10 NOTE — Telephone Encounter (Signed)
Please see below Ua results sent to you, culture to follow

## 2021-03-11 DIAGNOSIS — Z992 Dependence on renal dialysis: Secondary | ICD-10-CM | POA: Diagnosis not present

## 2021-03-11 DIAGNOSIS — D509 Iron deficiency anemia, unspecified: Secondary | ICD-10-CM | POA: Diagnosis not present

## 2021-03-11 DIAGNOSIS — N186 End stage renal disease: Secondary | ICD-10-CM | POA: Diagnosis not present

## 2021-03-11 DIAGNOSIS — D631 Anemia in chronic kidney disease: Secondary | ICD-10-CM | POA: Diagnosis not present

## 2021-03-12 ENCOUNTER — Telehealth: Payer: Self-pay | Admitting: Urology

## 2021-03-12 NOTE — Telephone Encounter (Signed)
Pt's wife called and wanted to get results of his urine he did. Please call when you can.

## 2021-03-12 NOTE — Telephone Encounter (Signed)
Correction- urine culture pending.   I called and spoke with wife, informed wife culture was pending and waiting for report. Wife voiced understanding.

## 2021-03-12 NOTE — Telephone Encounter (Signed)
Returned call to wife. Left message to return call.   After reviewing chart, no culture was ordered.

## 2021-03-13 ENCOUNTER — Other Ambulatory Visit: Payer: Self-pay

## 2021-03-13 DIAGNOSIS — E1122 Type 2 diabetes mellitus with diabetic chronic kidney disease: Secondary | ICD-10-CM | POA: Diagnosis not present

## 2021-03-13 DIAGNOSIS — M4727 Other spondylosis with radiculopathy, lumbosacral region: Secondary | ICD-10-CM | POA: Diagnosis not present

## 2021-03-13 DIAGNOSIS — Z4789 Encounter for other orthopedic aftercare: Secondary | ICD-10-CM | POA: Diagnosis not present

## 2021-03-13 DIAGNOSIS — M48062 Spinal stenosis, lumbar region with neurogenic claudication: Secondary | ICD-10-CM | POA: Diagnosis not present

## 2021-03-13 DIAGNOSIS — Z992 Dependence on renal dialysis: Secondary | ICD-10-CM | POA: Diagnosis not present

## 2021-03-13 DIAGNOSIS — M5136 Other intervertebral disc degeneration, lumbar region: Secondary | ICD-10-CM | POA: Diagnosis not present

## 2021-03-13 DIAGNOSIS — M5116 Intervertebral disc disorders with radiculopathy, lumbar region: Secondary | ICD-10-CM | POA: Diagnosis not present

## 2021-03-13 DIAGNOSIS — D631 Anemia in chronic kidney disease: Secondary | ICD-10-CM | POA: Diagnosis not present

## 2021-03-13 DIAGNOSIS — N186 End stage renal disease: Secondary | ICD-10-CM | POA: Diagnosis not present

## 2021-03-13 DIAGNOSIS — D509 Iron deficiency anemia, unspecified: Secondary | ICD-10-CM | POA: Diagnosis not present

## 2021-03-13 DIAGNOSIS — Z09 Encounter for follow-up examination after completed treatment for conditions other than malignant neoplasm: Secondary | ICD-10-CM | POA: Diagnosis not present

## 2021-03-13 DIAGNOSIS — M47816 Spondylosis without myelopathy or radiculopathy, lumbar region: Secondary | ICD-10-CM | POA: Diagnosis not present

## 2021-03-13 DIAGNOSIS — I120 Hypertensive chronic kidney disease with stage 5 chronic kidney disease or end stage renal disease: Secondary | ICD-10-CM | POA: Diagnosis not present

## 2021-03-16 DIAGNOSIS — Z992 Dependence on renal dialysis: Secondary | ICD-10-CM | POA: Diagnosis not present

## 2021-03-16 DIAGNOSIS — D509 Iron deficiency anemia, unspecified: Secondary | ICD-10-CM | POA: Diagnosis not present

## 2021-03-16 DIAGNOSIS — N186 End stage renal disease: Secondary | ICD-10-CM | POA: Diagnosis not present

## 2021-03-16 DIAGNOSIS — D631 Anemia in chronic kidney disease: Secondary | ICD-10-CM | POA: Diagnosis not present

## 2021-03-16 LAB — CULTURE, URINE COMPREHENSIVE

## 2021-03-18 ENCOUNTER — Telehealth: Payer: Self-pay

## 2021-03-18 DIAGNOSIS — N186 End stage renal disease: Secondary | ICD-10-CM | POA: Diagnosis not present

## 2021-03-18 DIAGNOSIS — Z992 Dependence on renal dialysis: Secondary | ICD-10-CM | POA: Diagnosis not present

## 2021-03-18 DIAGNOSIS — D631 Anemia in chronic kidney disease: Secondary | ICD-10-CM | POA: Diagnosis not present

## 2021-03-18 DIAGNOSIS — D509 Iron deficiency anemia, unspecified: Secondary | ICD-10-CM | POA: Diagnosis not present

## 2021-03-18 DIAGNOSIS — N3001 Acute cystitis with hematuria: Secondary | ICD-10-CM

## 2021-03-18 MED ORDER — SULFAMETHOXAZOLE-TRIMETHOPRIM 400-80 MG PO TABS: 1.0000 | ORAL_TABLET | Freq: Every day | ORAL | 0 refills | Status: DC

## 2021-03-18 NOTE — Telephone Encounter (Signed)
-----   Message from Cleon Gustin, MD sent at 03/18/2021  4:22 PM EDT ----- Bactrim SS daily for 7 days ----- Message ----- From: Dorisann Frames, RN Sent: 03/16/2021   4:27 PM EDT To: Cleon Gustin, MD  Please review

## 2021-03-18 NOTE — Telephone Encounter (Signed)
Prescription sent to pharmacy. Message left with wife.

## 2021-03-20 DIAGNOSIS — D509 Iron deficiency anemia, unspecified: Secondary | ICD-10-CM | POA: Diagnosis not present

## 2021-03-20 DIAGNOSIS — D631 Anemia in chronic kidney disease: Secondary | ICD-10-CM | POA: Diagnosis not present

## 2021-03-20 DIAGNOSIS — Z992 Dependence on renal dialysis: Secondary | ICD-10-CM | POA: Diagnosis not present

## 2021-03-20 DIAGNOSIS — N186 End stage renal disease: Secondary | ICD-10-CM | POA: Diagnosis not present

## 2021-03-23 DIAGNOSIS — D631 Anemia in chronic kidney disease: Secondary | ICD-10-CM | POA: Diagnosis not present

## 2021-03-23 DIAGNOSIS — Z992 Dependence on renal dialysis: Secondary | ICD-10-CM | POA: Diagnosis not present

## 2021-03-23 DIAGNOSIS — N186 End stage renal disease: Secondary | ICD-10-CM | POA: Diagnosis not present

## 2021-03-23 DIAGNOSIS — D509 Iron deficiency anemia, unspecified: Secondary | ICD-10-CM | POA: Diagnosis not present

## 2021-03-25 DIAGNOSIS — D509 Iron deficiency anemia, unspecified: Secondary | ICD-10-CM | POA: Diagnosis not present

## 2021-03-25 DIAGNOSIS — Z992 Dependence on renal dialysis: Secondary | ICD-10-CM | POA: Diagnosis not present

## 2021-03-25 DIAGNOSIS — N186 End stage renal disease: Secondary | ICD-10-CM | POA: Diagnosis not present

## 2021-03-25 DIAGNOSIS — D631 Anemia in chronic kidney disease: Secondary | ICD-10-CM | POA: Diagnosis not present

## 2021-03-26 DIAGNOSIS — Z4789 Encounter for other orthopedic aftercare: Secondary | ICD-10-CM | POA: Diagnosis not present

## 2021-03-26 DIAGNOSIS — M48062 Spinal stenosis, lumbar region with neurogenic claudication: Secondary | ICD-10-CM | POA: Diagnosis not present

## 2021-03-26 DIAGNOSIS — M5116 Intervertebral disc disorders with radiculopathy, lumbar region: Secondary | ICD-10-CM | POA: Diagnosis not present

## 2021-03-26 DIAGNOSIS — I120 Hypertensive chronic kidney disease with stage 5 chronic kidney disease or end stage renal disease: Secondary | ICD-10-CM | POA: Diagnosis not present

## 2021-03-26 DIAGNOSIS — E1122 Type 2 diabetes mellitus with diabetic chronic kidney disease: Secondary | ICD-10-CM | POA: Diagnosis not present

## 2021-03-26 DIAGNOSIS — M4727 Other spondylosis with radiculopathy, lumbosacral region: Secondary | ICD-10-CM | POA: Diagnosis not present

## 2021-03-27 ENCOUNTER — Telehealth: Payer: Self-pay

## 2021-03-27 DIAGNOSIS — N186 End stage renal disease: Secondary | ICD-10-CM | POA: Diagnosis not present

## 2021-03-27 DIAGNOSIS — D631 Anemia in chronic kidney disease: Secondary | ICD-10-CM | POA: Diagnosis not present

## 2021-03-27 DIAGNOSIS — Z992 Dependence on renal dialysis: Secondary | ICD-10-CM | POA: Diagnosis not present

## 2021-03-27 DIAGNOSIS — D509 Iron deficiency anemia, unspecified: Secondary | ICD-10-CM | POA: Diagnosis not present

## 2021-03-27 NOTE — Telephone Encounter (Signed)
Patient finished antibiotics.  He is not seeing any improvement since last visit.  Please call wife [name]) back at [phone].  Thanks, Helene Kelp

## 2021-03-27 NOTE — Telephone Encounter (Signed)
Returned call. Patient given appointment to see Dr on next open office day.

## 2021-03-28 DIAGNOSIS — Z9181 History of falling: Secondary | ICD-10-CM | POA: Diagnosis not present

## 2021-03-28 DIAGNOSIS — I120 Hypertensive chronic kidney disease with stage 5 chronic kidney disease or end stage renal disease: Secondary | ICD-10-CM | POA: Diagnosis not present

## 2021-03-28 DIAGNOSIS — Z992 Dependence on renal dialysis: Secondary | ICD-10-CM | POA: Diagnosis not present

## 2021-03-28 DIAGNOSIS — Z6829 Body mass index (BMI) 29.0-29.9, adult: Secondary | ICD-10-CM | POA: Diagnosis not present

## 2021-03-28 DIAGNOSIS — Z87891 Personal history of nicotine dependence: Secondary | ICD-10-CM | POA: Diagnosis not present

## 2021-03-28 DIAGNOSIS — M4727 Other spondylosis with radiculopathy, lumbosacral region: Secondary | ICD-10-CM | POA: Diagnosis not present

## 2021-03-28 DIAGNOSIS — J449 Chronic obstructive pulmonary disease, unspecified: Secondary | ICD-10-CM | POA: Diagnosis not present

## 2021-03-28 DIAGNOSIS — M48062 Spinal stenosis, lumbar region with neurogenic claudication: Secondary | ICD-10-CM | POA: Diagnosis not present

## 2021-03-28 DIAGNOSIS — N186 End stage renal disease: Secondary | ICD-10-CM | POA: Diagnosis not present

## 2021-03-28 DIAGNOSIS — Z794 Long term (current) use of insulin: Secondary | ICD-10-CM | POA: Diagnosis not present

## 2021-03-28 DIAGNOSIS — Z981 Arthrodesis status: Secondary | ICD-10-CM | POA: Diagnosis not present

## 2021-03-28 DIAGNOSIS — M5116 Intervertebral disc disorders with radiculopathy, lumbar region: Secondary | ICD-10-CM | POA: Diagnosis not present

## 2021-03-28 DIAGNOSIS — Z4789 Encounter for other orthopedic aftercare: Secondary | ICD-10-CM | POA: Diagnosis not present

## 2021-03-28 DIAGNOSIS — E1122 Type 2 diabetes mellitus with diabetic chronic kidney disease: Secondary | ICD-10-CM | POA: Diagnosis not present

## 2021-03-30 DIAGNOSIS — I120 Hypertensive chronic kidney disease with stage 5 chronic kidney disease or end stage renal disease: Secondary | ICD-10-CM | POA: Diagnosis not present

## 2021-03-30 DIAGNOSIS — Z992 Dependence on renal dialysis: Secondary | ICD-10-CM | POA: Diagnosis not present

## 2021-03-30 DIAGNOSIS — D631 Anemia in chronic kidney disease: Secondary | ICD-10-CM | POA: Diagnosis not present

## 2021-03-30 DIAGNOSIS — D509 Iron deficiency anemia, unspecified: Secondary | ICD-10-CM | POA: Diagnosis not present

## 2021-03-30 DIAGNOSIS — Z4789 Encounter for other orthopedic aftercare: Secondary | ICD-10-CM | POA: Diagnosis not present

## 2021-03-30 DIAGNOSIS — M5116 Intervertebral disc disorders with radiculopathy, lumbar region: Secondary | ICD-10-CM | POA: Diagnosis not present

## 2021-03-30 DIAGNOSIS — N186 End stage renal disease: Secondary | ICD-10-CM | POA: Diagnosis not present

## 2021-03-30 DIAGNOSIS — M4727 Other spondylosis with radiculopathy, lumbosacral region: Secondary | ICD-10-CM | POA: Diagnosis not present

## 2021-03-30 DIAGNOSIS — M48062 Spinal stenosis, lumbar region with neurogenic claudication: Secondary | ICD-10-CM | POA: Diagnosis not present

## 2021-03-30 DIAGNOSIS — E1122 Type 2 diabetes mellitus with diabetic chronic kidney disease: Secondary | ICD-10-CM | POA: Diagnosis not present

## 2021-04-01 DIAGNOSIS — D509 Iron deficiency anemia, unspecified: Secondary | ICD-10-CM | POA: Diagnosis not present

## 2021-04-01 DIAGNOSIS — Z992 Dependence on renal dialysis: Secondary | ICD-10-CM | POA: Diagnosis not present

## 2021-04-01 DIAGNOSIS — N186 End stage renal disease: Secondary | ICD-10-CM | POA: Diagnosis not present

## 2021-04-01 DIAGNOSIS — D631 Anemia in chronic kidney disease: Secondary | ICD-10-CM | POA: Diagnosis not present

## 2021-04-02 ENCOUNTER — Other Ambulatory Visit: Payer: Self-pay

## 2021-04-02 ENCOUNTER — Encounter: Payer: Self-pay | Admitting: Urology

## 2021-04-02 ENCOUNTER — Ambulatory Visit (INDEPENDENT_AMBULATORY_CARE_PROVIDER_SITE_OTHER): Payer: Medicare Other | Admitting: Urology

## 2021-04-02 VITALS — BP 152/84 | HR 99 | Temp 98.5°F | Ht 74.0 in | Wt 228.0 lb

## 2021-04-02 DIAGNOSIS — N390 Urinary tract infection, site not specified: Secondary | ICD-10-CM

## 2021-04-02 DIAGNOSIS — N3001 Acute cystitis with hematuria: Secondary | ICD-10-CM

## 2021-04-02 DIAGNOSIS — R3912 Poor urinary stream: Secondary | ICD-10-CM

## 2021-04-02 DIAGNOSIS — Z8546 Personal history of malignant neoplasm of prostate: Secondary | ICD-10-CM

## 2021-04-02 DIAGNOSIS — R300 Dysuria: Secondary | ICD-10-CM

## 2021-04-02 LAB — URINALYSIS, ROUTINE W REFLEX MICROSCOPIC
Bilirubin, UA: NEGATIVE
Glucose, UA: NEGATIVE
Nitrite, UA: NEGATIVE
Specific Gravity, UA: 1.015 (ref 1.005–1.030)
Urobilinogen, Ur: 0.2 mg/dL (ref 0.2–1.0)
pH, UA: 7 (ref 5.0–7.5)

## 2021-04-02 LAB — MICROSCOPIC EXAMINATION: Renal Epithel, UA: NONE SEEN /hpf

## 2021-04-02 MED ORDER — GENTAMICIN SULFATE 40 MG/ML IJ SOLN
160.0000 mg | Freq: Once | INTRAMUSCULAR | Status: AC
  Administered 2021-04-02: 160 mg via INTRAMUSCULAR

## 2021-04-02 NOTE — Progress Notes (Addendum)
160mg  of Gentamicin given in the left buttocks. Patient tolerated injection well.  post void residual= 214

## 2021-04-02 NOTE — Progress Notes (Signed)
Subjective:  1. Urinary tract infection without hematuria, site unspecified   2. History of prostate cancer   3. Dysuria   4. Weak urinary stream     Todd Robertson returns today in f/u for his history of cystitis.  He has dysuria and difficulty voiding.  He has some urgency and frequency.  He has had no hematuria.   He has been treated with bactrim after a round of ceftin.  He has been off of the anitbiotics for about 2 weeks.  He has had no fever.  He had Klebsiella on 03/10/21 and it was sensitive to the bactrim which he was last given.   He has variable urine output.   He has increased frequency in the afternoon.  He will generally go 4-5x daily.   He has a history of prostate cancer treated with radiation therapy 7-8 years ago.   He had a right radical nephrectomy in 7/21.   The urinary symptoms began after the procedure.   The last PSA's I find were 4.1 in 1/17 and 1.4 in 8/17.       ROS:  ROS:  A complete review of systems was performed.  All systems are negative except for pertinent findings as noted.   ROS  Allergies  Allergen Reactions  . No Known Allergies     Outpatient Encounter Medications as of 04/02/2021  Medication Sig  . acetaminophen (TYLENOL) 325 MG tablet Take 1-2 tablets (325-650 mg total) by mouth every 4 (four) hours as needed for mild pain.  Marland Kitchen albuterol (VENTOLIN HFA) 108 (90 Base) MCG/ACT inhaler Inhale 1-2 puffs into the lungs every 6 (six) hours as needed for wheezing or shortness of breath.  . allopurinol (ZYLOPRIM) 300 MG tablet Take 300 mg by mouth daily.  Marland Kitchen atorvastatin (LIPITOR) 40 MG tablet Take 1 tablet (40 mg total) by mouth daily.  . budesonide-formoterol (SYMBICORT) 160-4.5 MCG/ACT inhaler Inhale 2 puffs into the lungs 2 (two) times daily.  . calcium acetate (PHOSLO) 667 MG capsule Take 3 capsules (2,001 mg total) by mouth 3 (three) times daily with meals.  . cefUROXime (CEFTIN) 250 MG tablet Take 1 tablet (250 mg total) by mouth daily.  . cetirizine  (ZYRTEC) 10 MG tablet Take by mouth.  . Cholecalciferol (VITAMIN D) 50 MCG (2000 UT) tablet Take 1 tablet (2,000 Units total) by mouth daily.  Marland Kitchen HYDROcodone-acetaminophen (NORCO) 10-325 MG tablet Take 1 tablet by mouth 4 (four) times daily as needed.  . hydrocortisone (CORTEF) 10 MG tablet Take 1 tablet (10 mg total) by mouth 2 (two) times daily with a meal.  . insulin lispro (HUMALOG) 100 UNIT/ML KwikPen Inject into the skin.  Marland Kitchen linaclotide (LINZESS) 290 MCG CAPS capsule Take by mouth.  . loratadine (CLARITIN) 10 MG tablet Take 10 mg by mouth daily.  . metoprolol tartrate (LOPRESSOR) 25 MG tablet Take 0.5 tablets (12.5 mg total) by mouth 2 (two) times daily.  . midodrine (PROAMATINE) 10 MG tablet TAKE 1 TABLET BY MOUTH THREE TIMES DAILY WITH MEALS  . Multiple Vitamins-Minerals (CENTRUM SILVER 50+MEN) TABS Take 1 tablet by mouth daily.  . multivitamin (RENA-VIT) TABS tablet Take 1 tablet by mouth at bedtime.  . Oxycodone HCl 10 MG TABS Take 10 mg by mouth every 8 (eight) hours as needed.  . senna (SENOKOT) 8.6 MG tablet Take 1 tablet by mouth daily as needed for constipation.   . silver sulfADIAZINE (SILVADENE) 1 % cream Apply to affected area daily  . [DISCONTINUED] sulfamethoxazole-trimethoprim (BACTRIM) 400-80 MG  tablet Take 1 tablet by mouth daily. (Patient not taking: Reported on 04/02/2021)  . [EXPIRED] gentamicin (GARAMYCIN) injection 160 mg    No facility-administered encounter medications on file as of 04/02/2021.    Past Medical History:  Diagnosis Date  . Arthritis   . Cancer [facility])    prostate  . Cardiomyopathy    secondary  . CKD (chronic kidney disease)   . DM2 (diabetes mellitus, type 2) ([place])   . Gout   . HTN (hypertension)    unspec  . Hypercholesterolemia   . Nonischemic cardiomyopathy ([place])    a. EF 40-50% by most recent 2-D echo b. EF 25% by cardiac cath in 2004  . Overweight(278.02)     Past Surgical History:  Procedure Laterality Date  . AV FISTULA PLACEMENT  Left 06/06/2020   Procedure: LEFT ARM ARTERIOVENOUS (AV) GRAFT USING 45CM GORTEX;  Surgeon: [name], MD;  Location: [facility];  Service: Vascular;  Laterality: Left;  . COLONOSCOPY    . HERNIA REPAIR    . INSERTION OF DIALYSIS CATHETER Right 06/06/2020   Procedure: INSERTION OF DIALYSIS CATHETER USING 23CM DOUBLE LUMEN CATHETER;  Surgeon: [name], MD;  Location: [facility];  Service: Vascular;  Laterality: Right;  . IR FLUORO GUIDE CV LINE RIGHT  05/20/2020  . IR US GUIDE VASC ACCESS RIGHT  05/20/2020  . KNEE ARTHROPLASTY Left 08/08/2017   Procedure: LEFT TOTAL KNEE ARTHROPLASTY WITH COMPUTER NAVIGATION;  Surgeon: Rod Can, MD;  Location: [facility];  Service: Orthopedics;  Laterality: Left;  Needs RNFA  . KNEE ARTHROPLASTY Right 11/17/2017   Procedure: RIGHT TOTAL KNEE ARTHROPLASTY WITH COMPUTER NAVIGATION;  Surgeon: Rod Can, MD;  Location: [facility];  Service: Orthopedics;  Laterality: Right;  NEEDS RNFA  . ROBOT ASSISTED LAPAROSCOPIC NEPHRECTOMY Right 05/16/2020   Procedure: XI ROBOTIC ASSISTED LAPAROSCOPIC NEPHRECTOMY;  Surgeon: Cleon Gustin, MD;  Location: [facility];  Service: Urology;  Laterality: Right;  2.5 hrs    Social History   Socioeconomic History  . Marital status: Married    Spouse name: Not on file  . Number of children: 2  . Years of education: Not on file  . Highest education level: Not on file  Occupational History  . Occupation: retired  Tobacco Use  . Smoking status: Former Smoker    Packs/day: 0.50    Years: 20.00    Pack years: 10.00    Types: Cigarettes    Start date: 02/12/1966    Quit date: 11/08/1984    Years since quitting: 36.4  . Smokeless tobacco: Never Used  Vaping Use  . Vaping Use: Never used  Substance and Sexual Activity  . Alcohol use: No    Alcohol/week: 0.0 standard drinks  . Drug use: No  . Sexual activity: Yes    Birth control/protection: None  Other Topics Concern  . Not on file  Social History Narrative   Full time.     Social Determinants of Health   Financial Resource Strain: Not on file  Food Insecurity: Not on file  Transportation Needs: Not on file  Physical Activity: Not on file  Stress: Not on file  Social Connections: Not on file  Intimate Partner Violence: Not on file    Family History  Problem Relation Age of Onset  . Hypertension Mother   . Kidney disease Mother   . Heart disease Father   . Kidney disease Other        Objective: Vitals:   04/02/21 1341  BP: (!) 152/84  Pulse: 99  Temp: 98.5 F (36.9 C)     Physical Exam  Lab Results:  PSA No results found for: PSA No results found for: TESTOSTERONE  Results for orders placed or performed in visit on 04/02/21 (from the past 24 hour(s))  Urinalysis, Routine w reflex microscopic     Status: Abnormal   Collection Time: 04/02/21  1:40 PM  Result Value Ref Range   Specific Gravity, UA 1.015 1.005 - 1.030   pH, UA 7.0 5.0 - 7.5   Color, UA Yellow Yellow   Appearance Ur Clear Clear   Leukocytes,UA 1+ (A) Negative   Protein,UA 2+ (A) Negative/Trace   Glucose, UA Negative Negative   Ketones, UA Trace (A) Negative   RBC, UA Trace (A) Negative   Bilirubin, UA Negative Negative   Urobilinogen, Ur 0.2 0.2 - 1.0 mg/dL   Nitrite, UA Negative Negative   Microscopic Examination See below:    Narrative   Performed at:  [facility] [address], [place], [place]  [id number] Lab Director: Mina Marble MT, Phone:  [phone]  Microscopic Examination     Status: Abnormal   Collection Time: 04/02/21  1:40 PM   Urine  Result Value Ref Range   WBC, UA 11-30 (A) 0 - 5 /hpf   RBC 0-2 0 - 2 /hpf   Epithelial Cells (non renal) 0-10 0 - 10 /hpf   Renal Epithel, UA None seen None seen /hpf   Mucus, UA Present Not Estab.   Bacteria, UA Few (A) None seen/Few   Narrative   Performed at:  [facility] [address] [place], [place]  [id number] Lab Director: Mina Marble MT, Phone:  [phone]   PSA     Status: None   Collection Time: 04/02/21  2:36 PM  Result Value Ref Range   Prostate Specific Ag, Serum 0.6 0.0 - 4.0 ng/mL   Narrative   Performed at:  [address], [name], [place]  [id number] Lab Director: Rush Farmer MD, Phone:  [phone]     Studies/Results: No results found. Results for orders placed during the hospital encounter of 06/10/20  DG Abd 1 View  Narrative CLINICAL DATA:  Abdominal distention.  EXAM: ABDOMEN - 1 VIEW  COMPARISON:  05/30/2020  FINDINGS: Nasogastric tube has been removed. Bowel gas pattern is nonobstructed. No evidence for free intraperitoneal air or organomegaly.  IMPRESSION: Nonobstructive bowel gas pattern.   Electronically Signed By: Nolon Nations M.D. On: 06/14/2020 10:14  No results found for this or any previous visit.  No results found for this or any previous visit.  No results found for this or any previous visit.  Results for orders placed during the hospital encounter of 02/02/21  Ultrasound renal complete  Narrative CLINICAL DATA:  Right nephrectomy.  EXAM: RENAL / URINARY TRACT ULTRASOUND COMPLETE  COMPARISON:  CT 05/28/2020.  FINDINGS: Right Kidney:  Renal measurements: Right nephrectomy.  Scratch  Left Kidney:  Renal measurements: 12.0 x 5.9 x 5.5 cm = volume: 196.0 mL. Echogenicity within normal limits. 2.6 and 1.4 cm simple cysts. No hydronephrosis visualized.  Bladder:  Appears normal for degree of bladder distention. Ureteral jets not visualized.  Other:  None.  IMPRESSION: 1. Right nephrectomy. 2. Two simple cysts left kidney. No solid mass. No hydronephrosis or bladder distention.   Electronically Signed By: Marcello Moores  Register On: 02/03/2021 06:19  No results found for this or any previous visit.  No results found for this or any previous visit.  Results for orders placed during the hospital encounter of 02/01/20  CT RENAL STONE  STUDY  Narrative CLINICAL DATA:  Indeterminate right renal mass on recent ultrasound. Renal insufficiency.  EXAM: CT ABDOMEN AND PELVIS WITHOUT CONTRAST  TECHNIQUE: Multidetector CT imaging of the abdomen and pelvis was performed following the standard protocol without IV contrast.  COMPARISON:  12/25/2019 renal sonogram.  FINDINGS: Lower chest: No significant pulmonary nodules or acute consolidative airspace disease. Coronary atherosclerosis.  Hepatobiliary: Normal liver size. No liver mass. Normal gallbladder with no radiopaque cholelithiasis. No biliary ductal dilatation.  Pancreas: Normal, with no mass or duct dilation.  Spleen: Normal size. No mass.  Adrenals/Urinary Tract: Normal adrenals. Soft tissue density partially exophytic 6.4 x 5.4 cm renal cortical mass in the medial upper right kidney (series 2/image 24). Simple 2.8 cm posterior interpolar right renal cyst. Simple 1.7 cm anterior lower left renal cyst. No renal stones. No hydronephrosis. Normal caliber ureters. No ureteral stones. Normal bladder.  Stomach/Bowel: Normal non-distended stomach. Normal caliber small bowel with no small bowel wall thickening. Normal appendix. Scattered minimal colonic diverticulosis with no large bowel wall thickening or significant pericolonic fat stranding.  Vascular/Lymphatic: Atherosclerotic nonaneurysmal abdominal aorta. No pathologically enlarged lymph nodes in the abdomen or pelvis.  Reproductive: Top-normal size prostate. Fiducial markers surrounding the prostate  Other: No pneumoperitoneum, ascites or focal fluid collection. Small fat containing umbilical hernia.  Musculoskeletal: No aggressive appearing focal osseous lesions. Marked lumbar spondylosis.  IMPRESSION: 1. Soft tissue density partially exophytic 6.4 cm renal cortical mass in the medial upper right kidney, incompletely characterized on this noncontrast study (patient unable to receive IV contrast due  to GFR below 30). Renal cell carcinoma is to be excluded. If the patient's renal function improves (GFR greater than or equal to 30), renal mass protocol MRI (preferred) or CT abdomen without and with IV contrast is indicated for further evaluation. 2. No evidence of metastatic disease in the abdomen or pelvis on this noncontrast CT study. 3. Coronary atherosclerosis. 4. Aortic Atherosclerosis (ICD10-I70.0).   Electronically Signed By: Ilona Sorrel M.D. On: 02/02/2020 16:52    Assessment & Plan: LUTS with Dysuria and history of UTI.   Todd Robertson has persistent voiding symptoms despite 2 rounds of anitbiotics with the last culture appropriate.  Urine recultured today.  He was given Gentamycin 160mg  IM pending the culture.     Hx of prostate cancer with prior radiation therapy.   I am going to get a PSA.  (it is 0.6)  Incomplete bladder emptying.   He has had symptoms since a foley at the time of his nephrectomy.  I am going to have him return for cystoscopy.     Meds ordered this encounter  Medications  . gentamicin (GARAMYCIN) injection 160 mg     Orders Placed This Encounter  Procedures  . Urine Culture  . Microscopic Examination  . Urinalysis, Routine w reflex microscopic  . PSA  . Bladder scan      Return in about 2 weeks (around 04/16/2021) for for cystoscopy with me or Dr. Alyson Ingles. .   CC: Monico Blitz, MD      Irine Seal 04/03/2021 Patient ID: Todd Robertson, male   DOB: Mar 30, 1946, 74 y.o.   MRN: [id number]

## 2021-04-02 NOTE — Progress Notes (Signed)
Urological Symptom Review  Patient is experiencing the following symptoms: Burning/pain with urination Get up at night to urinate Leakage of urine Stream starts and stops Trouble starting stream Have to strain to urinate Erection problems (male only)   Review of Systems  Gastrointestinal (upper)  : Negative for upper GI symptoms  Gastrointestinal (lower) : Constipation  Constitutional : Negative for symptoms  Skin: Itching  Eyes: Negative for eye symptoms  Ear/Nose/Throat : Negative for Ear/Nose/Throat symptoms  Hematologic/Lymphatic: Negative for Hematologic/Lymphatic symptoms  Cardiovascular : Leg swelling  Respiratory : Cough  Endocrine: Negative for endocrine symptoms  Musculoskeletal: Back pain  Neurological: Negative for neurological symptoms  Psychologic: Negative for psychiatric symptoms

## 2021-04-03 DIAGNOSIS — Z992 Dependence on renal dialysis: Secondary | ICD-10-CM | POA: Diagnosis not present

## 2021-04-03 DIAGNOSIS — D631 Anemia in chronic kidney disease: Secondary | ICD-10-CM | POA: Diagnosis not present

## 2021-04-03 DIAGNOSIS — D509 Iron deficiency anemia, unspecified: Secondary | ICD-10-CM | POA: Diagnosis not present

## 2021-04-03 DIAGNOSIS — N186 End stage renal disease: Secondary | ICD-10-CM | POA: Diagnosis not present

## 2021-04-03 LAB — PSA: Prostate Specific Ag, Serum: 0.6 ng/mL (ref 0.0–4.0)

## 2021-04-03 LAB — BLADDER SCAN: Scan Result: 214

## 2021-04-05 LAB — URINE CULTURE

## 2021-04-06 DIAGNOSIS — D631 Anemia in chronic kidney disease: Secondary | ICD-10-CM | POA: Diagnosis not present

## 2021-04-06 DIAGNOSIS — D509 Iron deficiency anemia, unspecified: Secondary | ICD-10-CM | POA: Diagnosis not present

## 2021-04-06 DIAGNOSIS — N186 End stage renal disease: Secondary | ICD-10-CM | POA: Diagnosis not present

## 2021-04-06 DIAGNOSIS — Z992 Dependence on renal dialysis: Secondary | ICD-10-CM | POA: Diagnosis not present

## 2021-04-07 DIAGNOSIS — M48062 Spinal stenosis, lumbar region with neurogenic claudication: Secondary | ICD-10-CM | POA: Diagnosis not present

## 2021-04-07 DIAGNOSIS — M4727 Other spondylosis with radiculopathy, lumbosacral region: Secondary | ICD-10-CM | POA: Diagnosis not present

## 2021-04-07 DIAGNOSIS — N186 End stage renal disease: Secondary | ICD-10-CM | POA: Diagnosis not present

## 2021-04-07 DIAGNOSIS — I120 Hypertensive chronic kidney disease with stage 5 chronic kidney disease or end stage renal disease: Secondary | ICD-10-CM | POA: Diagnosis not present

## 2021-04-07 DIAGNOSIS — Z992 Dependence on renal dialysis: Secondary | ICD-10-CM | POA: Diagnosis not present

## 2021-04-07 DIAGNOSIS — Z4789 Encounter for other orthopedic aftercare: Secondary | ICD-10-CM | POA: Diagnosis not present

## 2021-04-07 DIAGNOSIS — E1122 Type 2 diabetes mellitus with diabetic chronic kidney disease: Secondary | ICD-10-CM | POA: Diagnosis not present

## 2021-04-07 DIAGNOSIS — M5116 Intervertebral disc disorders with radiculopathy, lumbar region: Secondary | ICD-10-CM | POA: Diagnosis not present

## 2021-04-08 ENCOUNTER — Telehealth: Payer: Self-pay

## 2021-04-08 DIAGNOSIS — D631 Anemia in chronic kidney disease: Secondary | ICD-10-CM | POA: Diagnosis not present

## 2021-04-08 DIAGNOSIS — D509 Iron deficiency anemia, unspecified: Secondary | ICD-10-CM | POA: Diagnosis not present

## 2021-04-08 DIAGNOSIS — Z23 Encounter for immunization: Secondary | ICD-10-CM | POA: Diagnosis not present

## 2021-04-08 DIAGNOSIS — N186 End stage renal disease: Secondary | ICD-10-CM | POA: Diagnosis not present

## 2021-04-08 DIAGNOSIS — Z992 Dependence on renal dialysis: Secondary | ICD-10-CM | POA: Diagnosis not present

## 2021-04-08 NOTE — Telephone Encounter (Signed)
Wife notified. appt scheduled for tomorrow.

## 2021-04-08 NOTE — Telephone Encounter (Signed)
-----   Message from Irine Seal, MD sent at 04/07/2021  9:43 AM EDT ----- Urine culture is growing Mx species so I don't think he needs additional antibiotics at this time.  He just needs to get back in for cystoscopy ASAP.

## 2021-04-09 ENCOUNTER — Other Ambulatory Visit: Payer: Self-pay

## 2021-04-09 ENCOUNTER — Ambulatory Visit (INDEPENDENT_AMBULATORY_CARE_PROVIDER_SITE_OTHER): Payer: Medicare Other | Admitting: Urology

## 2021-04-09 VITALS — BP 152/84 | HR 106

## 2021-04-09 DIAGNOSIS — N390 Urinary tract infection, site not specified: Secondary | ICD-10-CM | POA: Diagnosis not present

## 2021-04-09 DIAGNOSIS — R300 Dysuria: Secondary | ICD-10-CM | POA: Diagnosis not present

## 2021-04-09 DIAGNOSIS — Z8546 Personal history of malignant neoplasm of prostate: Secondary | ICD-10-CM | POA: Diagnosis not present

## 2021-04-09 DIAGNOSIS — R3912 Poor urinary stream: Secondary | ICD-10-CM

## 2021-04-09 LAB — MICROSCOPIC EXAMINATION
RBC, Urine: NONE SEEN /hpf (ref 0–2)
Renal Epithel, UA: NONE SEEN /hpf
WBC, UA: >30 /hpf — AB (ref 0–5)

## 2021-04-09 LAB — URINALYSIS, ROUTINE W REFLEX MICROSCOPIC
Bilirubin, UA: NEGATIVE
Glucose, UA: NEGATIVE
Ketones, UA: NEGATIVE
Nitrite, UA: NEGATIVE
RBC, UA: NEGATIVE
Specific Gravity, UA: 1.015 (ref 1.005–1.030)
Urobilinogen, Ur: 0.2 mg/dL (ref 0.2–1.0)
pH, UA: 8 — ABNORMAL HIGH (ref 5.0–7.5)

## 2021-04-09 MED ORDER — CIPROFLOXACIN HCL 500 MG PO TABS: 500.0000 mg | ORAL_TABLET | Freq: Once | ORAL | Status: DC

## 2021-04-09 MED ORDER — GENTAMICIN SULFATE 40 MG/ML IJ SOLN
160.0000 mg | Freq: Once | INTRAMUSCULAR | Status: AC
  Administered 2021-04-09: 160 mg via INTRAMUSCULAR

## 2021-04-09 NOTE — Progress Notes (Signed)
PVR: 49ml

## 2021-04-09 NOTE — Progress Notes (Signed)
  Uroflow  Peak Flow: 29ml Average Flow: 20ml Voided Volume: 69ml Voiding Time: 12sec Flow Time: 12sec Time to Peak Flow: 1sec      Urological Symptom Review  Patient is experiencing the following symptoms: Hard to postpone urination Burning/pain with urination Get up at night to urinate Leakage of urine Stream starts and stops Trouble starting stream Have to strain to urinate Urinary tract infection Weak stream Erection problems (male only)   Review of Systems  Gastrointestinal (upper)  : Negative for upper GI symptoms  Gastrointestinal (lower) : Constipation  Constitutional : Negative for symptoms  Skin: Itching  Eyes: Negative for eye symptoms  Ear/Nose/Throat : Negative for Ear/Nose/Throat symptoms  Hematologic/Lymphatic: Negative for Hematologic/Lymphatic symptoms  Cardiovascular : Negative for cardiovascular symptoms  Respiratory : Cough  Endocrine: Negative for endocrine symptoms  Musculoskeletal: Negative for musculoskeletal symptoms  Neurological: Negative for neurological symptoms  Psychologic: Negative for psychiatric symptoms

## 2021-04-09 NOTE — Progress Notes (Signed)
Subjective:  1. Weak urinary stream   2. Dysuria   3. History of prostate cancer   4. Urinary tract infection without hematuria, site unspecified      04/09/21: Todd Robertson returns today in f/u for cystoscopy to evaluate his persistent voiding symptoms and cystitis.   His culture on 04/02/21 was negative.   His PSA is 0.6 which is down from the prior levels.  He was given gent 160mg  at his last visit and had some transient symptom improvement.     GU Hx: Todd Robertson returns today in f/u for his history of cystitis.  He has dysuria and difficulty voiding.  He has some urgency and frequency.  He has had no hematuria.   He has been treated with bactrim after a round of ceftin.  He has been off of the anitbiotics for about 2 weeks.  He has had no fever.  He had Klebsiella on 03/10/21 and it was sensitive to the bactrim which he was last given.   He has variable urine output.   He has increased frequency in the afternoon.  He will generally go 4-5x daily.   He has a history of prostate cancer treated with radiation therapy 7-8 years ago.   He had a right radical nephrectomy in 7/21.   The urinary symptoms began after the procedure.   The last PSA's I find were 4.1 in 1/17 and 1.4 in 8/17.       ROS:  ROS:  A complete review of systems was performed.  All systems are negative except for pertinent findings as noted.   ROS  Allergies  Allergen Reactions  . No Known Allergies     Outpatient Encounter Medications as of 04/09/2021  Medication Sig  . acetaminophen (TYLENOL) 325 MG tablet Take 1-2 tablets (325-650 mg total) by mouth every 4 (four) hours as needed for mild pain.  Marland Kitchen albuterol (VENTOLIN HFA) 108 (90 Base) MCG/ACT inhaler Inhale 1-2 puffs into the lungs every 6 (six) hours as needed for wheezing or shortness of breath.  . allopurinol (ZYLOPRIM) 300 MG tablet Take 300 mg by mouth daily.  Marland Kitchen atorvastatin (LIPITOR) 40 MG tablet Take 1 tablet (40 mg total) by mouth daily.  .  budesonide-formoterol (SYMBICORT) 160-4.5 MCG/ACT inhaler Inhale 2 puffs into the lungs 2 (two) times daily.  . calcium acetate (PHOSLO) 667 MG capsule Take 3 capsules (2,001 mg total) by mouth 3 (three) times daily with meals.  . cefUROXime (CEFTIN) 250 MG tablet Take 1 tablet (250 mg total) by mouth daily.  . cetirizine (ZYRTEC) 10 MG tablet Take by mouth.  . Cholecalciferol (VITAMIN D) 50 MCG (2000 UT) tablet Take 1 tablet (2,000 Units total) by mouth daily.  Marland Kitchen HYDROcodone-acetaminophen (NORCO) 10-325 MG tablet Take 1 tablet by mouth 4 (four) times daily as needed.  . hydrocortisone (CORTEF) 10 MG tablet Take 1 tablet (10 mg total) by mouth 2 (two) times daily with a meal.  . insulin lispro (HUMALOG) 100 UNIT/ML KwikPen Inject into the skin.  Marland Kitchen linaclotide (LINZESS) 290 MCG CAPS capsule Take by mouth.  . loratadine (CLARITIN) 10 MG tablet Take 10 mg by mouth daily.  . metoprolol tartrate (LOPRESSOR) 25 MG tablet Take 0.5 tablets (12.5 mg total) by mouth 2 (two) times daily.  . midodrine (PROAMATINE) 10 MG tablet TAKE 1 TABLET BY MOUTH THREE TIMES DAILY WITH MEALS  . Multiple Vitamins-Minerals (CENTRUM SILVER 50+MEN) TABS Take 1 tablet by mouth daily.  . multivitamin (RENA-VIT) TABS tablet Take 1  tablet by mouth at bedtime.  . Oxycodone HCl 10 MG TABS Take 10 mg by mouth every 8 (eight) hours as needed.  . senna (SENOKOT) 8.6 MG tablet Take 1 tablet by mouth daily as needed for constipation.   . silver sulfADIAZINE (SILVADENE) 1 % cream Apply to affected area daily  . [EXPIRED] gentamicin (GARAMYCIN) injection 160 mg   . [DISCONTINUED] ciprofloxacin (CIPRO) tablet 500 mg    No facility-administered encounter medications on file as of 04/09/2021.    Past Medical History:  Diagnosis Date  . Arthritis   . Cancer [facility])    prostate  . Cardiomyopathy    secondary  . CKD (chronic kidney disease)   . DM2 (diabetes mellitus, type 2) ([place])   . Gout   . HTN (hypertension)    unspec  .  Hypercholesterolemia   . Nonischemic cardiomyopathy ([place])    a. EF 40-50% by most recent 2-D echo b. EF 25% by cardiac cath in 2004  . Overweight(278.02)     Past Surgical History:  Procedure Laterality Date  . AV FISTULA PLACEMENT Left 06/06/2020   Procedure: LEFT ARM ARTERIOVENOUS (AV) GRAFT USING 45CM GORTEX;  Surgeon: [name], MD;  Location: [place];  Service: Vascular;  Laterality: Left;  . COLONOSCOPY    . HERNIA REPAIR    . INSERTION OF DIALYSIS CATHETER Right 06/06/2020   Procedure: INSERTION OF DIALYSIS CATHETER USING 23CM DOUBLE LUMEN CATHETER;  Surgeon: [name], MD;  Location: [place];  Service: Vascular;  Laterality: Right;  . IR FLUORO GUIDE CV LINE RIGHT  05/20/2020  . IR US GUIDE VASC ACCESS RIGHT  05/20/2020  . KNEE ARTHROPLASTY Left 08/08/2017   Procedure: LEFT TOTAL KNEE ARTHROPLASTY WITH COMPUTER NAVIGATION;  Surgeon: Rod Can, MD;  Location: [place];  Service: Orthopedics;  Laterality: Left;  Needs RNFA  . KNEE ARTHROPLASTY Right 11/17/2017   Procedure: RIGHT TOTAL KNEE ARTHROPLASTY WITH COMPUTER NAVIGATION;  Surgeon: Rod Can, MD;  [facility];  Service: Orthopedics;  Laterality: Right;  NEEDS RNFA  . ROBOT ASSISTED LAPAROSCOPIC NEPHRECTOMY Right 05/16/2020   Procedure: XI ROBOTIC ASSISTED LAPAROSCOPIC NEPHRECTOMY;  Surgeon: Cleon Gustin, MD;  Location[facility];  Service: Urology;  Laterality: Right;  2.5 hrs    Social History   Socioeconomic History  . Marital status: Married    Spouse name: Not on file  . Number of children: 2  . Years of education: Not on file  . Highest education level: Not on file  Occupational History  . Occupation: retired  Tobacco Use  . Smoking status: Former Smoker    Packs/day: 0.50    Years: 20.00    Pack years: 10.00    Types: Cigarettes    Start date: 02/12/1966    Quit date: 11/08/1984    Years since quitting: 36.4  . Smokeless tobacco: Never Used  Vaping Use  . Vaping Use: Never used  Substance and  Sexual Activity  . Alcohol use: No    Alcohol/week: 0.0 standard drinks  . Drug use: No  . Sexual activity: Yes    Birth control/protection: None  Other Topics Concern  . Not on file  Social History Narrative   Full time.    Social Determinants of Health   Financial Resource Strain: Not on file  Food Insecurity: Not on file  Transportation Needs: Not on file  Physical Activity: Not on file  Stress: Not on file  Social Connections: Not on file  Intimate Partner Violence:  Not on file    Family History  Problem Relation Age of Onset  . Hypertension Mother   . Kidney disease Mother   . Heart disease Father   . Kidney disease Other        Objective: Vitals:   04/09/21 1318  BP: (!) 152/84  Pulse: (!) 106     Physical Exam  Lab Results:  PSA No results found for: PSA No results found for: TESTOSTERONE  No results found for this or any previous visit (from the past 24 hour(s)).  UA has >30 WBC's with a few bacteria.     Studies/Results:   Procedure: Flexible cystoscopy.  He was prepped with betadine and draped.  2% lidocaine was instilled.   The flexible scope was passed.  The urethra is normal.  The prostate is about 2-3cm with lateral lobe hyperplasia and coaptation.  There is some blanching of the mucosa consistent with radiation effect.  The bladder has mild trabeculation with some mild mucosal erythema and cloudy urine.  No tumors or stones are noted.  The UO's are unremarkable.     Flow rate was done.    Assessment & Plan: LUTS with Dysuria and history of UTI.   The last culture was negative but the urine remains quite cloudy.  He got some benefit with the dose of gentamycin at the last visit so he was given another dose today.   I will reach out to his nephrologist about a more prolonged course of the gentamycin.  Marland Kitchen     Hx of prostate cancer with prior radiation therapy.  PSA is 0.6 which is down from the last we have.   Incomplete bladder emptying.   His stream is weak with a small voiding volume and the PVR is only 42ml.  I will see if we can use tamsulosin on him.    Meds ordered this encounter  Medications  . DISCONTD: ciprofloxacin (CIPRO) tablet 500 mg  . gentamicin (GARAMYCIN) injection 160 mg     Orders Placed This Encounter  Procedures  . Urine Culture  . Urinalysis, Routine w reflex microscopic      Return in about 1 month (around 05/09/2021).   CC: Monico Blitz, MD      Irine Seal 04/09/2021 Patient ID: Todd Robertson, male   DOB: 09/11/1946, 75 y.o.   MRN: [id number] Patient ID: Todd Robertson, male   DOB: 1946/03/26, 75 y.o.   MRN: [id number]

## 2021-04-10 DIAGNOSIS — D631 Anemia in chronic kidney disease: Secondary | ICD-10-CM | POA: Diagnosis not present

## 2021-04-10 DIAGNOSIS — D509 Iron deficiency anemia, unspecified: Secondary | ICD-10-CM | POA: Diagnosis not present

## 2021-04-10 DIAGNOSIS — N186 End stage renal disease: Secondary | ICD-10-CM | POA: Diagnosis not present

## 2021-04-10 DIAGNOSIS — Z992 Dependence on renal dialysis: Secondary | ICD-10-CM | POA: Diagnosis not present

## 2021-04-10 DIAGNOSIS — Z23 Encounter for immunization: Secondary | ICD-10-CM | POA: Diagnosis not present

## 2021-04-11 LAB — URINE CULTURE: Organism ID, Bacteria: NO GROWTH

## 2021-04-13 ENCOUNTER — Telehealth: Payer: Self-pay

## 2021-04-13 DIAGNOSIS — E1122 Type 2 diabetes mellitus with diabetic chronic kidney disease: Secondary | ICD-10-CM | POA: Diagnosis not present

## 2021-04-13 DIAGNOSIS — M5116 Intervertebral disc disorders with radiculopathy, lumbar region: Secondary | ICD-10-CM | POA: Diagnosis not present

## 2021-04-13 DIAGNOSIS — R3912 Poor urinary stream: Secondary | ICD-10-CM

## 2021-04-13 DIAGNOSIS — E1151 Type 2 diabetes mellitus with diabetic peripheral angiopathy without gangrene: Secondary | ICD-10-CM | POA: Diagnosis not present

## 2021-04-13 DIAGNOSIS — Z4789 Encounter for other orthopedic aftercare: Secondary | ICD-10-CM | POA: Diagnosis not present

## 2021-04-13 DIAGNOSIS — L110 Acquired keratosis follicularis: Secondary | ICD-10-CM | POA: Diagnosis not present

## 2021-04-13 DIAGNOSIS — I120 Hypertensive chronic kidney disease with stage 5 chronic kidney disease or end stage renal disease: Secondary | ICD-10-CM | POA: Diagnosis not present

## 2021-04-13 DIAGNOSIS — M4727 Other spondylosis with radiculopathy, lumbosacral region: Secondary | ICD-10-CM | POA: Diagnosis not present

## 2021-04-13 DIAGNOSIS — D509 Iron deficiency anemia, unspecified: Secondary | ICD-10-CM | POA: Diagnosis not present

## 2021-04-13 DIAGNOSIS — Z992 Dependence on renal dialysis: Secondary | ICD-10-CM | POA: Diagnosis not present

## 2021-04-13 DIAGNOSIS — B351 Tinea unguium: Secondary | ICD-10-CM | POA: Diagnosis not present

## 2021-04-13 DIAGNOSIS — M48062 Spinal stenosis, lumbar region with neurogenic claudication: Secondary | ICD-10-CM | POA: Diagnosis not present

## 2021-04-13 DIAGNOSIS — D631 Anemia in chronic kidney disease: Secondary | ICD-10-CM | POA: Diagnosis not present

## 2021-04-13 DIAGNOSIS — Z23 Encounter for immunization: Secondary | ICD-10-CM | POA: Diagnosis not present

## 2021-04-13 DIAGNOSIS — E1140 Type 2 diabetes mellitus with diabetic neuropathy, unspecified: Secondary | ICD-10-CM | POA: Diagnosis not present

## 2021-04-13 DIAGNOSIS — N186 End stage renal disease: Secondary | ICD-10-CM | POA: Diagnosis not present

## 2021-04-13 MED ORDER — TAMSULOSIN HCL 0.4 MG PO CAPS: 0.4000 mg | ORAL_CAPSULE | Freq: Every day | ORAL | 11 refills | Status: DC

## 2021-04-13 NOTE — Telephone Encounter (Signed)
-----   Message from Irine Seal, MD sent at 04/12/2021  8:17 AM EDT ----- Regarding: FW: Please send a script for tamsulosin 0.4mg  po qday #30 with refills and let Mr. Methot know that his nephrologist is ok with it.    thanks ----- Message ----- From: Liana Gerold, MD Sent: 04/11/2021   7:15 PM EDT To: Irine Seal, MD Subject: RE:                                            Good evening,  No reason you cannot give it . Please do if you think pt needs it.  Thank you ----- Message ----- From: Irine Seal, MD Sent: 04/09/2021   1:55 PM EDT To: Liana Gerold, MD  I am considering tamsulosin on this patient who has ESRD on dialysis.  Do you know of any reason I shouldn't.  I don't find dosing recommendations for tamsulosin in dialysis patients.

## 2021-04-13 NOTE — Telephone Encounter (Signed)
Prescription sent in . Called patient. No answer.

## 2021-04-14 ENCOUNTER — Telehealth: Payer: Self-pay

## 2021-04-14 DIAGNOSIS — N390 Urinary tract infection, site not specified: Secondary | ICD-10-CM

## 2021-04-14 MED ORDER — LEVOFLOXACIN 750 MG PO TABS: 750.0000 mg | ORAL_TABLET | ORAL | 0 refills | Status: DC

## 2021-04-14 NOTE — Telephone Encounter (Signed)
-----   Message from Irine Seal, MD sent at 04/13/2021  4:55 PM EDT ----- Regarding: Antibiotics  Dr Johnnye Sima recommended Levaquin 750mg  po 3xweekly after dialysis for two weeks instead of the gentamicin.  Can you send that in for the patient.  Thx

## 2021-04-14 NOTE — Telephone Encounter (Signed)
Prescription sent in and wife notified.

## 2021-04-15 DIAGNOSIS — D631 Anemia in chronic kidney disease: Secondary | ICD-10-CM | POA: Diagnosis not present

## 2021-04-15 DIAGNOSIS — N186 End stage renal disease: Secondary | ICD-10-CM | POA: Diagnosis not present

## 2021-04-15 DIAGNOSIS — D509 Iron deficiency anemia, unspecified: Secondary | ICD-10-CM | POA: Diagnosis not present

## 2021-04-15 DIAGNOSIS — Z23 Encounter for immunization: Secondary | ICD-10-CM | POA: Diagnosis not present

## 2021-04-15 DIAGNOSIS — Z992 Dependence on renal dialysis: Secondary | ICD-10-CM | POA: Diagnosis not present

## 2021-04-17 DIAGNOSIS — D509 Iron deficiency anemia, unspecified: Secondary | ICD-10-CM | POA: Diagnosis not present

## 2021-04-17 DIAGNOSIS — D631 Anemia in chronic kidney disease: Secondary | ICD-10-CM | POA: Diagnosis not present

## 2021-04-17 DIAGNOSIS — Z992 Dependence on renal dialysis: Secondary | ICD-10-CM | POA: Diagnosis not present

## 2021-04-17 DIAGNOSIS — Z23 Encounter for immunization: Secondary | ICD-10-CM | POA: Diagnosis not present

## 2021-04-17 DIAGNOSIS — N186 End stage renal disease: Secondary | ICD-10-CM | POA: Diagnosis not present

## 2021-04-20 DIAGNOSIS — Z992 Dependence on renal dialysis: Secondary | ICD-10-CM | POA: Diagnosis not present

## 2021-04-20 DIAGNOSIS — D509 Iron deficiency anemia, unspecified: Secondary | ICD-10-CM | POA: Diagnosis not present

## 2021-04-20 DIAGNOSIS — D631 Anemia in chronic kidney disease: Secondary | ICD-10-CM | POA: Diagnosis not present

## 2021-04-20 DIAGNOSIS — Z23 Encounter for immunization: Secondary | ICD-10-CM | POA: Diagnosis not present

## 2021-04-20 DIAGNOSIS — N186 End stage renal disease: Secondary | ICD-10-CM | POA: Diagnosis not present

## 2021-04-21 DIAGNOSIS — E1122 Type 2 diabetes mellitus with diabetic chronic kidney disease: Secondary | ICD-10-CM | POA: Diagnosis not present

## 2021-04-21 DIAGNOSIS — M5116 Intervertebral disc disorders with radiculopathy, lumbar region: Secondary | ICD-10-CM | POA: Diagnosis not present

## 2021-04-21 DIAGNOSIS — I120 Hypertensive chronic kidney disease with stage 5 chronic kidney disease or end stage renal disease: Secondary | ICD-10-CM | POA: Diagnosis not present

## 2021-04-21 DIAGNOSIS — Z4789 Encounter for other orthopedic aftercare: Secondary | ICD-10-CM | POA: Diagnosis not present

## 2021-04-21 DIAGNOSIS — M4727 Other spondylosis with radiculopathy, lumbosacral region: Secondary | ICD-10-CM | POA: Diagnosis not present

## 2021-04-21 DIAGNOSIS — M48062 Spinal stenosis, lumbar region with neurogenic claudication: Secondary | ICD-10-CM | POA: Diagnosis not present

## 2021-04-22 DIAGNOSIS — Z23 Encounter for immunization: Secondary | ICD-10-CM | POA: Diagnosis not present

## 2021-04-22 DIAGNOSIS — N186 End stage renal disease: Secondary | ICD-10-CM | POA: Diagnosis not present

## 2021-04-22 DIAGNOSIS — D631 Anemia in chronic kidney disease: Secondary | ICD-10-CM | POA: Diagnosis not present

## 2021-04-22 DIAGNOSIS — D509 Iron deficiency anemia, unspecified: Secondary | ICD-10-CM | POA: Diagnosis not present

## 2021-04-22 DIAGNOSIS — Z992 Dependence on renal dialysis: Secondary | ICD-10-CM | POA: Diagnosis not present

## 2021-04-24 DIAGNOSIS — D631 Anemia in chronic kidney disease: Secondary | ICD-10-CM | POA: Diagnosis not present

## 2021-04-24 DIAGNOSIS — Z23 Encounter for immunization: Secondary | ICD-10-CM | POA: Diagnosis not present

## 2021-04-24 DIAGNOSIS — N186 End stage renal disease: Secondary | ICD-10-CM | POA: Diagnosis not present

## 2021-04-24 DIAGNOSIS — Z992 Dependence on renal dialysis: Secondary | ICD-10-CM | POA: Diagnosis not present

## 2021-04-24 DIAGNOSIS — D509 Iron deficiency anemia, unspecified: Secondary | ICD-10-CM | POA: Diagnosis not present

## 2021-04-27 DIAGNOSIS — N186 End stage renal disease: Secondary | ICD-10-CM | POA: Diagnosis not present

## 2021-04-27 DIAGNOSIS — D509 Iron deficiency anemia, unspecified: Secondary | ICD-10-CM | POA: Diagnosis not present

## 2021-04-27 DIAGNOSIS — Z992 Dependence on renal dialysis: Secondary | ICD-10-CM | POA: Diagnosis not present

## 2021-04-27 DIAGNOSIS — Z23 Encounter for immunization: Secondary | ICD-10-CM | POA: Diagnosis not present

## 2021-04-27 DIAGNOSIS — D631 Anemia in chronic kidney disease: Secondary | ICD-10-CM | POA: Diagnosis not present

## 2021-04-29 DIAGNOSIS — Z23 Encounter for immunization: Secondary | ICD-10-CM | POA: Diagnosis not present

## 2021-04-29 DIAGNOSIS — Z992 Dependence on renal dialysis: Secondary | ICD-10-CM | POA: Diagnosis not present

## 2021-04-29 DIAGNOSIS — D509 Iron deficiency anemia, unspecified: Secondary | ICD-10-CM | POA: Diagnosis not present

## 2021-04-29 DIAGNOSIS — N186 End stage renal disease: Secondary | ICD-10-CM | POA: Diagnosis not present

## 2021-04-29 DIAGNOSIS — D631 Anemia in chronic kidney disease: Secondary | ICD-10-CM | POA: Diagnosis not present

## 2021-04-30 ENCOUNTER — Other Ambulatory Visit: Payer: Medicare Other | Admitting: Urology

## 2021-05-01 DIAGNOSIS — Z23 Encounter for immunization: Secondary | ICD-10-CM | POA: Diagnosis not present

## 2021-05-01 DIAGNOSIS — Z992 Dependence on renal dialysis: Secondary | ICD-10-CM | POA: Diagnosis not present

## 2021-05-01 DIAGNOSIS — N186 End stage renal disease: Secondary | ICD-10-CM | POA: Diagnosis not present

## 2021-05-01 DIAGNOSIS — D509 Iron deficiency anemia, unspecified: Secondary | ICD-10-CM | POA: Diagnosis not present

## 2021-05-01 DIAGNOSIS — D631 Anemia in chronic kidney disease: Secondary | ICD-10-CM | POA: Diagnosis not present

## 2021-05-04 DIAGNOSIS — D509 Iron deficiency anemia, unspecified: Secondary | ICD-10-CM | POA: Diagnosis not present

## 2021-05-04 DIAGNOSIS — Z23 Encounter for immunization: Secondary | ICD-10-CM | POA: Diagnosis not present

## 2021-05-04 DIAGNOSIS — M47816 Spondylosis without myelopathy or radiculopathy, lumbar region: Secondary | ICD-10-CM | POA: Diagnosis not present

## 2021-05-04 DIAGNOSIS — Z09 Encounter for follow-up examination after completed treatment for conditions other than malignant neoplasm: Secondary | ICD-10-CM | POA: Diagnosis not present

## 2021-05-04 DIAGNOSIS — D631 Anemia in chronic kidney disease: Secondary | ICD-10-CM | POA: Diagnosis not present

## 2021-05-04 DIAGNOSIS — M48062 Spinal stenosis, lumbar region with neurogenic claudication: Secondary | ICD-10-CM | POA: Diagnosis not present

## 2021-05-04 DIAGNOSIS — E119 Type 2 diabetes mellitus without complications: Secondary | ICD-10-CM | POA: Diagnosis not present

## 2021-05-04 DIAGNOSIS — Z992 Dependence on renal dialysis: Secondary | ICD-10-CM | POA: Diagnosis not present

## 2021-05-04 DIAGNOSIS — M5136 Other intervertebral disc degeneration, lumbar region: Secondary | ICD-10-CM | POA: Diagnosis not present

## 2021-05-04 DIAGNOSIS — N186 End stage renal disease: Secondary | ICD-10-CM | POA: Diagnosis not present

## 2021-05-06 DIAGNOSIS — Z992 Dependence on renal dialysis: Secondary | ICD-10-CM | POA: Diagnosis not present

## 2021-05-06 DIAGNOSIS — Z23 Encounter for immunization: Secondary | ICD-10-CM | POA: Diagnosis not present

## 2021-05-06 DIAGNOSIS — D631 Anemia in chronic kidney disease: Secondary | ICD-10-CM | POA: Diagnosis not present

## 2021-05-06 DIAGNOSIS — D509 Iron deficiency anemia, unspecified: Secondary | ICD-10-CM | POA: Diagnosis not present

## 2021-05-06 DIAGNOSIS — N186 End stage renal disease: Secondary | ICD-10-CM | POA: Diagnosis not present

## 2021-05-07 DIAGNOSIS — N186 End stage renal disease: Secondary | ICD-10-CM | POA: Diagnosis not present

## 2021-05-07 DIAGNOSIS — Z992 Dependence on renal dialysis: Secondary | ICD-10-CM | POA: Diagnosis not present

## 2021-05-08 DIAGNOSIS — Z23 Encounter for immunization: Secondary | ICD-10-CM | POA: Diagnosis not present

## 2021-05-08 DIAGNOSIS — D631 Anemia in chronic kidney disease: Secondary | ICD-10-CM | POA: Diagnosis not present

## 2021-05-08 DIAGNOSIS — E559 Vitamin D deficiency, unspecified: Secondary | ICD-10-CM | POA: Diagnosis not present

## 2021-05-08 DIAGNOSIS — Z992 Dependence on renal dialysis: Secondary | ICD-10-CM | POA: Diagnosis not present

## 2021-05-08 DIAGNOSIS — N250 Renal osteodystrophy: Secondary | ICD-10-CM | POA: Diagnosis not present

## 2021-05-08 DIAGNOSIS — D509 Iron deficiency anemia, unspecified: Secondary | ICD-10-CM | POA: Diagnosis not present

## 2021-05-08 DIAGNOSIS — N186 End stage renal disease: Secondary | ICD-10-CM | POA: Diagnosis not present

## 2021-05-11 DIAGNOSIS — Z992 Dependence on renal dialysis: Secondary | ICD-10-CM | POA: Diagnosis not present

## 2021-05-11 DIAGNOSIS — N186 End stage renal disease: Secondary | ICD-10-CM | POA: Diagnosis not present

## 2021-05-11 DIAGNOSIS — N250 Renal osteodystrophy: Secondary | ICD-10-CM | POA: Diagnosis not present

## 2021-05-11 DIAGNOSIS — D509 Iron deficiency anemia, unspecified: Secondary | ICD-10-CM | POA: Diagnosis not present

## 2021-05-11 DIAGNOSIS — Z23 Encounter for immunization: Secondary | ICD-10-CM | POA: Diagnosis not present

## 2021-05-11 DIAGNOSIS — D631 Anemia in chronic kidney disease: Secondary | ICD-10-CM | POA: Diagnosis not present

## 2021-05-13 DIAGNOSIS — N250 Renal osteodystrophy: Secondary | ICD-10-CM | POA: Diagnosis not present

## 2021-05-13 DIAGNOSIS — Z23 Encounter for immunization: Secondary | ICD-10-CM | POA: Diagnosis not present

## 2021-05-13 DIAGNOSIS — D509 Iron deficiency anemia, unspecified: Secondary | ICD-10-CM | POA: Diagnosis not present

## 2021-05-13 DIAGNOSIS — N186 End stage renal disease: Secondary | ICD-10-CM | POA: Diagnosis not present

## 2021-05-13 DIAGNOSIS — D631 Anemia in chronic kidney disease: Secondary | ICD-10-CM | POA: Diagnosis not present

## 2021-05-13 DIAGNOSIS — Z992 Dependence on renal dialysis: Secondary | ICD-10-CM | POA: Diagnosis not present

## 2021-05-14 DIAGNOSIS — Z20822 Contact with and (suspected) exposure to covid-19: Secondary | ICD-10-CM | POA: Diagnosis not present

## 2021-05-15 DIAGNOSIS — N250 Renal osteodystrophy: Secondary | ICD-10-CM | POA: Diagnosis not present

## 2021-05-15 DIAGNOSIS — Z23 Encounter for immunization: Secondary | ICD-10-CM | POA: Diagnosis not present

## 2021-05-15 DIAGNOSIS — N186 End stage renal disease: Secondary | ICD-10-CM | POA: Diagnosis not present

## 2021-05-15 DIAGNOSIS — D631 Anemia in chronic kidney disease: Secondary | ICD-10-CM | POA: Diagnosis not present

## 2021-05-15 DIAGNOSIS — Z992 Dependence on renal dialysis: Secondary | ICD-10-CM | POA: Diagnosis not present

## 2021-05-15 DIAGNOSIS — D509 Iron deficiency anemia, unspecified: Secondary | ICD-10-CM | POA: Diagnosis not present

## 2021-05-18 DIAGNOSIS — Z23 Encounter for immunization: Secondary | ICD-10-CM | POA: Diagnosis not present

## 2021-05-18 DIAGNOSIS — N250 Renal osteodystrophy: Secondary | ICD-10-CM | POA: Diagnosis not present

## 2021-05-18 DIAGNOSIS — D631 Anemia in chronic kidney disease: Secondary | ICD-10-CM | POA: Diagnosis not present

## 2021-05-18 DIAGNOSIS — N186 End stage renal disease: Secondary | ICD-10-CM | POA: Diagnosis not present

## 2021-05-18 DIAGNOSIS — Z992 Dependence on renal dialysis: Secondary | ICD-10-CM | POA: Diagnosis not present

## 2021-05-18 DIAGNOSIS — D509 Iron deficiency anemia, unspecified: Secondary | ICD-10-CM | POA: Diagnosis not present

## 2021-05-20 DIAGNOSIS — N250 Renal osteodystrophy: Secondary | ICD-10-CM | POA: Diagnosis not present

## 2021-05-20 DIAGNOSIS — Z992 Dependence on renal dialysis: Secondary | ICD-10-CM | POA: Diagnosis not present

## 2021-05-20 DIAGNOSIS — Z23 Encounter for immunization: Secondary | ICD-10-CM | POA: Diagnosis not present

## 2021-05-20 DIAGNOSIS — N186 End stage renal disease: Secondary | ICD-10-CM | POA: Diagnosis not present

## 2021-05-20 DIAGNOSIS — D509 Iron deficiency anemia, unspecified: Secondary | ICD-10-CM | POA: Diagnosis not present

## 2021-05-20 DIAGNOSIS — D631 Anemia in chronic kidney disease: Secondary | ICD-10-CM | POA: Diagnosis not present

## 2021-05-22 DIAGNOSIS — Z23 Encounter for immunization: Secondary | ICD-10-CM | POA: Diagnosis not present

## 2021-05-22 DIAGNOSIS — D509 Iron deficiency anemia, unspecified: Secondary | ICD-10-CM | POA: Diagnosis not present

## 2021-05-22 DIAGNOSIS — N186 End stage renal disease: Secondary | ICD-10-CM | POA: Diagnosis not present

## 2021-05-22 DIAGNOSIS — D631 Anemia in chronic kidney disease: Secondary | ICD-10-CM | POA: Diagnosis not present

## 2021-05-22 DIAGNOSIS — N250 Renal osteodystrophy: Secondary | ICD-10-CM | POA: Diagnosis not present

## 2021-05-22 DIAGNOSIS — Z992 Dependence on renal dialysis: Secondary | ICD-10-CM | POA: Diagnosis not present

## 2021-05-25 DIAGNOSIS — Z992 Dependence on renal dialysis: Secondary | ICD-10-CM | POA: Diagnosis not present

## 2021-05-25 DIAGNOSIS — N250 Renal osteodystrophy: Secondary | ICD-10-CM | POA: Diagnosis not present

## 2021-05-25 DIAGNOSIS — D631 Anemia in chronic kidney disease: Secondary | ICD-10-CM | POA: Diagnosis not present

## 2021-05-25 DIAGNOSIS — N186 End stage renal disease: Secondary | ICD-10-CM | POA: Diagnosis not present

## 2021-05-25 DIAGNOSIS — Z23 Encounter for immunization: Secondary | ICD-10-CM | POA: Diagnosis not present

## 2021-05-25 DIAGNOSIS — D509 Iron deficiency anemia, unspecified: Secondary | ICD-10-CM | POA: Diagnosis not present

## 2021-05-26 DIAGNOSIS — Z23 Encounter for immunization: Secondary | ICD-10-CM | POA: Diagnosis not present

## 2021-05-27 DIAGNOSIS — Z992 Dependence on renal dialysis: Secondary | ICD-10-CM | POA: Diagnosis not present

## 2021-05-27 DIAGNOSIS — D509 Iron deficiency anemia, unspecified: Secondary | ICD-10-CM | POA: Diagnosis not present

## 2021-05-27 DIAGNOSIS — N186 End stage renal disease: Secondary | ICD-10-CM | POA: Diagnosis not present

## 2021-05-27 DIAGNOSIS — Z23 Encounter for immunization: Secondary | ICD-10-CM | POA: Diagnosis not present

## 2021-05-27 DIAGNOSIS — D631 Anemia in chronic kidney disease: Secondary | ICD-10-CM | POA: Diagnosis not present

## 2021-05-27 DIAGNOSIS — N250 Renal osteodystrophy: Secondary | ICD-10-CM | POA: Diagnosis not present

## 2021-05-29 DIAGNOSIS — D509 Iron deficiency anemia, unspecified: Secondary | ICD-10-CM | POA: Diagnosis not present

## 2021-05-29 DIAGNOSIS — N186 End stage renal disease: Secondary | ICD-10-CM | POA: Diagnosis not present

## 2021-05-29 DIAGNOSIS — D631 Anemia in chronic kidney disease: Secondary | ICD-10-CM | POA: Diagnosis not present

## 2021-05-29 DIAGNOSIS — N250 Renal osteodystrophy: Secondary | ICD-10-CM | POA: Diagnosis not present

## 2021-05-29 DIAGNOSIS — Z992 Dependence on renal dialysis: Secondary | ICD-10-CM | POA: Diagnosis not present

## 2021-05-29 DIAGNOSIS — Z23 Encounter for immunization: Secondary | ICD-10-CM | POA: Diagnosis not present

## 2021-06-01 DIAGNOSIS — Z23 Encounter for immunization: Secondary | ICD-10-CM | POA: Diagnosis not present

## 2021-06-01 DIAGNOSIS — N250 Renal osteodystrophy: Secondary | ICD-10-CM | POA: Diagnosis not present

## 2021-06-01 DIAGNOSIS — Z992 Dependence on renal dialysis: Secondary | ICD-10-CM | POA: Diagnosis not present

## 2021-06-01 DIAGNOSIS — D509 Iron deficiency anemia, unspecified: Secondary | ICD-10-CM | POA: Diagnosis not present

## 2021-06-01 DIAGNOSIS — D631 Anemia in chronic kidney disease: Secondary | ICD-10-CM | POA: Diagnosis not present

## 2021-06-01 DIAGNOSIS — N186 End stage renal disease: Secondary | ICD-10-CM | POA: Diagnosis not present

## 2021-06-03 DIAGNOSIS — Z992 Dependence on renal dialysis: Secondary | ICD-10-CM | POA: Diagnosis not present

## 2021-06-03 DIAGNOSIS — D631 Anemia in chronic kidney disease: Secondary | ICD-10-CM | POA: Diagnosis not present

## 2021-06-03 DIAGNOSIS — N250 Renal osteodystrophy: Secondary | ICD-10-CM | POA: Diagnosis not present

## 2021-06-03 DIAGNOSIS — D509 Iron deficiency anemia, unspecified: Secondary | ICD-10-CM | POA: Diagnosis not present

## 2021-06-03 DIAGNOSIS — N186 End stage renal disease: Secondary | ICD-10-CM | POA: Diagnosis not present

## 2021-06-03 DIAGNOSIS — Z23 Encounter for immunization: Secondary | ICD-10-CM | POA: Diagnosis not present

## 2021-06-04 DIAGNOSIS — I10 Essential (primary) hypertension: Secondary | ICD-10-CM | POA: Diagnosis not present

## 2021-06-04 DIAGNOSIS — Z6828 Body mass index (BMI) 28.0-28.9, adult: Secondary | ICD-10-CM | POA: Diagnosis not present

## 2021-06-04 DIAGNOSIS — E1165 Type 2 diabetes mellitus with hyperglycemia: Secondary | ICD-10-CM | POA: Diagnosis not present

## 2021-06-04 DIAGNOSIS — B079 Viral wart, unspecified: Secondary | ICD-10-CM | POA: Diagnosis not present

## 2021-06-04 DIAGNOSIS — R030 Elevated blood-pressure reading, without diagnosis of hypertension: Secondary | ICD-10-CM | POA: Diagnosis not present

## 2021-06-04 DIAGNOSIS — Z299 Encounter for prophylactic measures, unspecified: Secondary | ICD-10-CM | POA: Diagnosis not present

## 2021-06-04 DIAGNOSIS — N2581 Secondary hyperparathyroidism of renal origin: Secondary | ICD-10-CM | POA: Diagnosis not present

## 2021-06-04 DIAGNOSIS — Z992 Dependence on renal dialysis: Secondary | ICD-10-CM | POA: Diagnosis not present

## 2021-06-04 DIAGNOSIS — N186 End stage renal disease: Secondary | ICD-10-CM | POA: Diagnosis not present

## 2021-06-05 DIAGNOSIS — D631 Anemia in chronic kidney disease: Secondary | ICD-10-CM | POA: Diagnosis not present

## 2021-06-05 DIAGNOSIS — Z23 Encounter for immunization: Secondary | ICD-10-CM | POA: Diagnosis not present

## 2021-06-05 DIAGNOSIS — Z992 Dependence on renal dialysis: Secondary | ICD-10-CM | POA: Diagnosis not present

## 2021-06-05 DIAGNOSIS — D509 Iron deficiency anemia, unspecified: Secondary | ICD-10-CM | POA: Diagnosis not present

## 2021-06-05 DIAGNOSIS — N250 Renal osteodystrophy: Secondary | ICD-10-CM | POA: Diagnosis not present

## 2021-06-05 DIAGNOSIS — N186 End stage renal disease: Secondary | ICD-10-CM | POA: Diagnosis not present

## 2021-06-07 DIAGNOSIS — N186 End stage renal disease: Secondary | ICD-10-CM | POA: Diagnosis not present

## 2021-06-07 DIAGNOSIS — Z992 Dependence on renal dialysis: Secondary | ICD-10-CM | POA: Diagnosis not present

## 2021-06-08 DIAGNOSIS — N2581 Secondary hyperparathyroidism of renal origin: Secondary | ICD-10-CM | POA: Diagnosis not present

## 2021-06-08 DIAGNOSIS — Z992 Dependence on renal dialysis: Secondary | ICD-10-CM | POA: Diagnosis not present

## 2021-06-08 DIAGNOSIS — Z23 Encounter for immunization: Secondary | ICD-10-CM | POA: Diagnosis not present

## 2021-06-08 DIAGNOSIS — D509 Iron deficiency anemia, unspecified: Secondary | ICD-10-CM | POA: Diagnosis not present

## 2021-06-08 DIAGNOSIS — D631 Anemia in chronic kidney disease: Secondary | ICD-10-CM | POA: Diagnosis not present

## 2021-06-08 DIAGNOSIS — N186 End stage renal disease: Secondary | ICD-10-CM | POA: Diagnosis not present

## 2021-06-10 DIAGNOSIS — D631 Anemia in chronic kidney disease: Secondary | ICD-10-CM | POA: Diagnosis not present

## 2021-06-10 DIAGNOSIS — N2581 Secondary hyperparathyroidism of renal origin: Secondary | ICD-10-CM | POA: Diagnosis not present

## 2021-06-10 DIAGNOSIS — D509 Iron deficiency anemia, unspecified: Secondary | ICD-10-CM | POA: Diagnosis not present

## 2021-06-10 DIAGNOSIS — N186 End stage renal disease: Secondary | ICD-10-CM | POA: Diagnosis not present

## 2021-06-10 DIAGNOSIS — Z23 Encounter for immunization: Secondary | ICD-10-CM | POA: Diagnosis not present

## 2021-06-10 DIAGNOSIS — Z992 Dependence on renal dialysis: Secondary | ICD-10-CM | POA: Diagnosis not present

## 2021-06-12 DIAGNOSIS — Z23 Encounter for immunization: Secondary | ICD-10-CM | POA: Diagnosis not present

## 2021-06-12 DIAGNOSIS — D631 Anemia in chronic kidney disease: Secondary | ICD-10-CM | POA: Diagnosis not present

## 2021-06-12 DIAGNOSIS — Z992 Dependence on renal dialysis: Secondary | ICD-10-CM | POA: Diagnosis not present

## 2021-06-12 DIAGNOSIS — D509 Iron deficiency anemia, unspecified: Secondary | ICD-10-CM | POA: Diagnosis not present

## 2021-06-12 DIAGNOSIS — N186 End stage renal disease: Secondary | ICD-10-CM | POA: Diagnosis not present

## 2021-06-12 DIAGNOSIS — N2581 Secondary hyperparathyroidism of renal origin: Secondary | ICD-10-CM | POA: Diagnosis not present

## 2021-06-15 DIAGNOSIS — N186 End stage renal disease: Secondary | ICD-10-CM | POA: Diagnosis not present

## 2021-06-15 DIAGNOSIS — D631 Anemia in chronic kidney disease: Secondary | ICD-10-CM | POA: Diagnosis not present

## 2021-06-15 DIAGNOSIS — D509 Iron deficiency anemia, unspecified: Secondary | ICD-10-CM | POA: Diagnosis not present

## 2021-06-15 DIAGNOSIS — Z992 Dependence on renal dialysis: Secondary | ICD-10-CM | POA: Diagnosis not present

## 2021-06-15 DIAGNOSIS — N2581 Secondary hyperparathyroidism of renal origin: Secondary | ICD-10-CM | POA: Diagnosis not present

## 2021-06-15 DIAGNOSIS — Z23 Encounter for immunization: Secondary | ICD-10-CM | POA: Diagnosis not present

## 2021-06-17 DIAGNOSIS — N186 End stage renal disease: Secondary | ICD-10-CM | POA: Diagnosis not present

## 2021-06-17 DIAGNOSIS — Z992 Dependence on renal dialysis: Secondary | ICD-10-CM | POA: Diagnosis not present

## 2021-06-17 DIAGNOSIS — Z23 Encounter for immunization: Secondary | ICD-10-CM | POA: Diagnosis not present

## 2021-06-17 DIAGNOSIS — D509 Iron deficiency anemia, unspecified: Secondary | ICD-10-CM | POA: Diagnosis not present

## 2021-06-17 DIAGNOSIS — D631 Anemia in chronic kidney disease: Secondary | ICD-10-CM | POA: Diagnosis not present

## 2021-06-17 DIAGNOSIS — N2581 Secondary hyperparathyroidism of renal origin: Secondary | ICD-10-CM | POA: Diagnosis not present

## 2021-06-18 ENCOUNTER — Ambulatory Visit (INDEPENDENT_AMBULATORY_CARE_PROVIDER_SITE_OTHER): Payer: Medicare Other | Admitting: Urology

## 2021-06-18 ENCOUNTER — Other Ambulatory Visit: Payer: Self-pay

## 2021-06-18 VITALS — BP 112/71 | HR 79

## 2021-06-18 DIAGNOSIS — Z8546 Personal history of malignant neoplasm of prostate: Secondary | ICD-10-CM | POA: Diagnosis not present

## 2021-06-18 DIAGNOSIS — Z87440 Personal history of urinary (tract) infections: Secondary | ICD-10-CM | POA: Diagnosis not present

## 2021-06-18 NOTE — Progress Notes (Signed)
Urological Symptom Review  Patient is experiencing the following symptoms: Get up at night to urinate Erection problems (male only)   Review of Systems  Gastrointestinal (upper)  : Negative for upper GI symptoms  Gastrointestinal (lower) : Constipation  Constitutional : Negative for symptoms  Skin: Itching  Eyes: Negative for eye symptoms  Ear/Nose/Throat : Negative for Ear/Nose/Throat symptoms  Hematologic/Lymphatic: Negative for Hematologic/Lymphatic symptoms  Cardiovascular : Negative for cardiovascular symptoms  Respiratory : Cough  Endocrine: Negative for endocrine symptoms  Musculoskeletal: Negative for musculoskeletal symptoms  Neurological: Negative for neurological symptoms  Psychologic: Negative for psychiatric symptoms

## 2021-06-18 NOTE — Progress Notes (Signed)
Subjective:  1. History of urinary infection   2. History of prostate cancer    06/18/21:  Todd Robertson returns today in f/u.  He is doing well with an IPSS of 3.  He has had no symptoms of a recurrent infection.   He didn't get a UA today.   He remains on tamsulosin but is off of of the Levaquin prescribed for post dialysis use on 04/15/21.    He had a right nephrectomy for RCCa in 7/21 and is to have an [place] and CXR next month.   04/09/21: Todd Robertson returns today in f/u for cystoscopy to evaluate his persistent voiding symptoms and cystitis.   His culture on 04/02/21 was negative.   His PSA is 0.6 which is down from the prior levels.  He was given gent 160mg  at his last visit and had some transient symptom improvement.     GU Hx: Todd Robertson returns today in f/u for his history of cystitis.  He has dysuria and difficulty voiding.  He has some urgency and frequency.  He has had no hematuria.   He has been treated with bactrim after a round of ceftin.  He has been off of the anitbiotics for about 2 weeks.  He has had no fever.  He had Klebsiella on 03/10/21 and it was sensitive to the bactrim which he was last given.   He has variable urine output.   He has increased frequency in the afternoon.  He will generally go 4-5x daily.   He has a history of prostate cancer treated with radiation therapy 7-8 years ago.   He had a right radical nephrectomy in 7/21.   The urinary symptoms began after the procedure.   The last PSA's I find were 4.1 in 1/17 and 1.4 in 8/17.       ROS:  ROS:  A complete review of systems was performed.  All systems are negative except for pertinent findings as noted.   ROS  Allergies  Allergen Reactions   No Known Allergies     Outpatient Encounter Medications as of 06/18/2021  Medication Sig   acetaminophen (TYLENOL) 325 MG tablet Take 1-2 tablets (325-650 mg total) by mouth every 4 (four) hours as needed for mild pain.   albuterol (VENTOLIN HFA) 108 (90 Base) MCG/ACT  inhaler Inhale 1-2 puffs into the lungs every 6 (six) hours as needed for wheezing or shortness of breath.   allopurinol (ZYLOPRIM) 300 MG tablet Take 300 mg by mouth daily.   atorvastatin (LIPITOR) 40 MG tablet Take 1 tablet (40 mg total) by mouth daily.   budesonide-formoterol (SYMBICORT) 160-4.5 MCG/ACT inhaler Inhale 2 puffs into the lungs 2 (two) times daily.   calcium acetate (PHOSLO) 667 MG capsule Take 3 capsules (2,001 mg total) by mouth 3 (three) times daily with meals.   cetirizine (ZYRTEC) 10 MG tablet Take by mouth.   Cholecalciferol (VITAMIN D) 50 MCG (2000 UT) tablet Take 1 tablet (2,000 Units total) by mouth daily.   insulin lispro (HUMALOG) 100 UNIT/ML KwikPen Inject into the skin.   linaclotide (LINZESS) 290 MCG CAPS capsule Take by mouth.   loratadine (CLARITIN) 10 MG tablet Take 10 mg by mouth daily.   metoprolol tartrate (LOPRESSOR) 25 MG tablet Take 0.5 tablets (12.5 mg total) by mouth 2 (two) times daily.   midodrine (PROAMATINE) 10 MG tablet TAKE 1 TABLET BY MOUTH THREE TIMES DAILY WITH MEALS   Multiple Vitamins-Minerals (CENTRUM SILVER 50+MEN) TABS Take 1 tablet by mouth  daily.   multivitamin (RENA-VIT) TABS tablet Take 1 tablet by mouth at bedtime.   senna (SENOKOT) 8.6 MG tablet Take 1 tablet by mouth daily as needed for constipation.    silver sulfADIAZINE (SILVADENE) 1 % cream Apply to affected area daily   tamsulosin (FLOMAX) 0.4 MG CAPS capsule Take 1 capsule (0.4 mg total) by mouth daily.   [DISCONTINUED] cefUROXime (CEFTIN) 250 MG tablet Take 1 tablet (250 mg total) by mouth daily.   [DISCONTINUED] HYDROcodone-acetaminophen (NORCO) 10-325 MG tablet Take 1 tablet by mouth 4 (four) times daily as needed.   [DISCONTINUED] hydrocortisone (CORTEF) 10 MG tablet Take 1 tablet (10 mg total) by mouth 2 (two) times daily with a meal.   [DISCONTINUED] levofloxacin (LEVAQUIN) 750 MG tablet Take 1 tablet (750 mg total) by mouth 3 (three) times a week. Take 1 tablet by mouth  after dialysis for two weeks   [DISCONTINUED] Oxycodone HCl 10 MG TABS Take 10 mg by mouth every 8 (eight) hours as needed.   No facility-administered encounter medications on file as of 06/18/2021.    Past Medical History:  Diagnosis Date   Arthritis    Cancer ([place])    prostate   Cardiomyopathy    secondary   CKD (chronic kidney disease)    DM2 (diabetes mellitus, type 2) ([facility])    Gout    HTN (hypertension)    unspec   Hypercholesterolemia    Nonischemic cardiomyopathy ([facility])    a. EF 40-50% by most recent 2-D echo b. EF 25% by cardiac cath in 2004   Overweight(278.02)     Past Surgical History:  Procedure Laterality Date   AV FISTULA PLACEMENT Left 06/06/2020   Procedure: LEFT ARM ARTERIOVENOUS (AV) GRAFT USING 45CM GORTEX;  Surgeon: [name], MD;  Location: [place];  Service: Vascular;  Laterality: Left;   COLONOSCOPY     HERNIA REPAIR     INSERTION OF DIALYSIS CATHETER Right 06/06/2020   Procedure: INSERTION OF DIALYSIS CATHETER USING 23CM DOUBLE LUMEN CATHETER;  Surgeon: [name], MD;  Location: [place];  Service: Vascular;  Laterality: Right;   IR FLUORO GUIDE CV LINE RIGHT  05/20/2020   IR US GUIDE VASC ACCESS RIGHT  05/20/2020   KNEE ARTHROPLASTY Left 08/08/2017   Procedure: LEFT TOTAL KNEE ARTHROPLASTY WITH COMPUTER NAVIGATION;  Surgeon: Rod Can, MD;  Location: [place];  Service: Orthopedics;  Laterality: Left;  Needs RNFA   KNEE ARTHROPLASTY Right 11/17/2017   Procedure: RIGHT TOTAL KNEE ARTHROPLASTY WITH COMPUTER NAVIGATION;  Surgeon: Rod Can, MD;  [facility];  Service: Orthopedics;  Laterality: Right;  NEEDS RNFA   ROBOT ASSISTED LAPAROSCOPIC NEPHRECTOMY Right 05/16/2020   Procedure: XI ROBOTIC ASSISTED LAPAROSCOPIC NEPHRECTOMY;  Surgeon: Cleon Gustin, MD;  [facility];  Service: Urology;  Laterality: Right;  2.5 hrs    Social History   Socioeconomic History   Marital status: Married    Spouse name: Not on file   Number of  children: 2   Years of education: Not on file   Highest education level: Not on file  Occupational History   Occupation: retired  Tobacco Use   Smoking status: Former    Packs/day: 0.50    Years: 20.00    Pack years: 10.00    Types: Cigarettes    Start date: 02/12/1966    Quit date: 11/08/1984    Years since quitting: 36.6   Smokeless tobacco: Never  Vaping Use   Vaping Use: Never used  Substance and Sexual Activity   Alcohol use: No    Alcohol/week: 0.0 standard drinks   Drug use: No   Sexual activity: Yes    Birth control/protection: None  Other Topics Concern   Not on file  Social History Narrative   Full time.    Social Determinants of Health   Financial Resource Strain: Not on file  Food Insecurity: Not on file  Transportation Needs: Not on file  Physical Activity: Not on file  Stress: Not on file  Social Connections: Not on file  Intimate Partner Violence: Not on file    Family History  Problem Relation Age of Onset   Hypertension Mother    Kidney disease Mother    Heart disease Father    Kidney disease Other        Objective: Vitals:   06/18/21 1347  BP: 112/71  Pulse: 79     Physical Exam  Lab Results:  PSA No results found for: PSA No results found for: TESTOSTERONE  No results found for this or any previous visit (from the past 24 hour(s)).   Studies/Results:      Assessment & Plan: LUTS with Dysuria and history of UTI.  He is doing well since getting post dialysis Levaquin for 6 doses.   His symptoms have resolved.   Hx of prostate cancer with prior radiation therapy.  F/u in 6 months with a PSA.   Incomplete bladder emptying.  He is doing well on tamsulosin.  No orders of the defined types were placed in this encounter.    Orders Placed This Encounter  Procedures   Urinalysis, Routine w reflex microscopic   PSA    Standing Status:   Future    Standing Expiration Date:   06/18/2022      Return in about 6 months (around  12/19/2021) for wtih PSA and UA.   CC: Monico Blitz, MD      Irine Seal 06/18/2021 Patient ID: Sanjuan Dame, male   DOB: [birth year], 75 y.o.   MRN: [id number] Patient ID: BRENYN PETREY, male   DOB: September 21, 1946, 75 y.o.   MRN: [id number] Patient ID: NECO KLING, male   DOB: 11/05/1946, 74 y.o.   MRN: [id number]

## 2021-06-19 DIAGNOSIS — Z992 Dependence on renal dialysis: Secondary | ICD-10-CM | POA: Diagnosis not present

## 2021-06-19 DIAGNOSIS — N2581 Secondary hyperparathyroidism of renal origin: Secondary | ICD-10-CM | POA: Diagnosis not present

## 2021-06-19 DIAGNOSIS — D509 Iron deficiency anemia, unspecified: Secondary | ICD-10-CM | POA: Diagnosis not present

## 2021-06-19 DIAGNOSIS — D631 Anemia in chronic kidney disease: Secondary | ICD-10-CM | POA: Diagnosis not present

## 2021-06-19 DIAGNOSIS — Z23 Encounter for immunization: Secondary | ICD-10-CM | POA: Diagnosis not present

## 2021-06-19 DIAGNOSIS — N186 End stage renal disease: Secondary | ICD-10-CM | POA: Diagnosis not present

## 2021-06-22 DIAGNOSIS — Z23 Encounter for immunization: Secondary | ICD-10-CM | POA: Diagnosis not present

## 2021-06-22 DIAGNOSIS — N2581 Secondary hyperparathyroidism of renal origin: Secondary | ICD-10-CM | POA: Diagnosis not present

## 2021-06-22 DIAGNOSIS — D631 Anemia in chronic kidney disease: Secondary | ICD-10-CM | POA: Diagnosis not present

## 2021-06-22 DIAGNOSIS — Z992 Dependence on renal dialysis: Secondary | ICD-10-CM | POA: Diagnosis not present

## 2021-06-22 DIAGNOSIS — D509 Iron deficiency anemia, unspecified: Secondary | ICD-10-CM | POA: Diagnosis not present

## 2021-06-22 DIAGNOSIS — N186 End stage renal disease: Secondary | ICD-10-CM | POA: Diagnosis not present

## 2021-06-24 DIAGNOSIS — N2581 Secondary hyperparathyroidism of renal origin: Secondary | ICD-10-CM | POA: Diagnosis not present

## 2021-06-24 DIAGNOSIS — Z23 Encounter for immunization: Secondary | ICD-10-CM | POA: Diagnosis not present

## 2021-06-24 DIAGNOSIS — D509 Iron deficiency anemia, unspecified: Secondary | ICD-10-CM | POA: Diagnosis not present

## 2021-06-24 DIAGNOSIS — Z992 Dependence on renal dialysis: Secondary | ICD-10-CM | POA: Diagnosis not present

## 2021-06-24 DIAGNOSIS — D631 Anemia in chronic kidney disease: Secondary | ICD-10-CM | POA: Diagnosis not present

## 2021-06-24 DIAGNOSIS — N186 End stage renal disease: Secondary | ICD-10-CM | POA: Diagnosis not present

## 2021-06-26 DIAGNOSIS — N2581 Secondary hyperparathyroidism of renal origin: Secondary | ICD-10-CM | POA: Diagnosis not present

## 2021-06-26 DIAGNOSIS — N186 End stage renal disease: Secondary | ICD-10-CM | POA: Diagnosis not present

## 2021-06-26 DIAGNOSIS — Z23 Encounter for immunization: Secondary | ICD-10-CM | POA: Diagnosis not present

## 2021-06-26 DIAGNOSIS — D509 Iron deficiency anemia, unspecified: Secondary | ICD-10-CM | POA: Diagnosis not present

## 2021-06-26 DIAGNOSIS — D631 Anemia in chronic kidney disease: Secondary | ICD-10-CM | POA: Diagnosis not present

## 2021-06-26 DIAGNOSIS — Z992 Dependence on renal dialysis: Secondary | ICD-10-CM | POA: Diagnosis not present

## 2021-06-29 DIAGNOSIS — Z23 Encounter for immunization: Secondary | ICD-10-CM | POA: Diagnosis not present

## 2021-06-29 DIAGNOSIS — E1151 Type 2 diabetes mellitus with diabetic peripheral angiopathy without gangrene: Secondary | ICD-10-CM | POA: Diagnosis not present

## 2021-06-29 DIAGNOSIS — D631 Anemia in chronic kidney disease: Secondary | ICD-10-CM | POA: Diagnosis not present

## 2021-06-29 DIAGNOSIS — Z992 Dependence on renal dialysis: Secondary | ICD-10-CM | POA: Diagnosis not present

## 2021-06-29 DIAGNOSIS — B351 Tinea unguium: Secondary | ICD-10-CM | POA: Diagnosis not present

## 2021-06-29 DIAGNOSIS — L110 Acquired keratosis follicularis: Secondary | ICD-10-CM | POA: Diagnosis not present

## 2021-06-29 DIAGNOSIS — D509 Iron deficiency anemia, unspecified: Secondary | ICD-10-CM | POA: Diagnosis not present

## 2021-06-29 DIAGNOSIS — N186 End stage renal disease: Secondary | ICD-10-CM | POA: Diagnosis not present

## 2021-06-29 DIAGNOSIS — N2581 Secondary hyperparathyroidism of renal origin: Secondary | ICD-10-CM | POA: Diagnosis not present

## 2021-06-29 DIAGNOSIS — E1140 Type 2 diabetes mellitus with diabetic neuropathy, unspecified: Secondary | ICD-10-CM | POA: Diagnosis not present

## 2021-07-01 DIAGNOSIS — N2581 Secondary hyperparathyroidism of renal origin: Secondary | ICD-10-CM | POA: Diagnosis not present

## 2021-07-01 DIAGNOSIS — Z23 Encounter for immunization: Secondary | ICD-10-CM | POA: Diagnosis not present

## 2021-07-01 DIAGNOSIS — D509 Iron deficiency anemia, unspecified: Secondary | ICD-10-CM | POA: Diagnosis not present

## 2021-07-01 DIAGNOSIS — N186 End stage renal disease: Secondary | ICD-10-CM | POA: Diagnosis not present

## 2021-07-01 DIAGNOSIS — Z992 Dependence on renal dialysis: Secondary | ICD-10-CM | POA: Diagnosis not present

## 2021-07-01 DIAGNOSIS — D631 Anemia in chronic kidney disease: Secondary | ICD-10-CM | POA: Diagnosis not present

## 2021-07-03 DIAGNOSIS — D509 Iron deficiency anemia, unspecified: Secondary | ICD-10-CM | POA: Diagnosis not present

## 2021-07-03 DIAGNOSIS — N186 End stage renal disease: Secondary | ICD-10-CM | POA: Diagnosis not present

## 2021-07-03 DIAGNOSIS — N2581 Secondary hyperparathyroidism of renal origin: Secondary | ICD-10-CM | POA: Diagnosis not present

## 2021-07-03 DIAGNOSIS — D631 Anemia in chronic kidney disease: Secondary | ICD-10-CM | POA: Diagnosis not present

## 2021-07-03 DIAGNOSIS — Z992 Dependence on renal dialysis: Secondary | ICD-10-CM | POA: Diagnosis not present

## 2021-07-03 DIAGNOSIS — Z23 Encounter for immunization: Secondary | ICD-10-CM | POA: Diagnosis not present

## 2021-07-06 DIAGNOSIS — Z23 Encounter for immunization: Secondary | ICD-10-CM | POA: Diagnosis not present

## 2021-07-06 DIAGNOSIS — D509 Iron deficiency anemia, unspecified: Secondary | ICD-10-CM | POA: Diagnosis not present

## 2021-07-06 DIAGNOSIS — D631 Anemia in chronic kidney disease: Secondary | ICD-10-CM | POA: Diagnosis not present

## 2021-07-06 DIAGNOSIS — Z992 Dependence on renal dialysis: Secondary | ICD-10-CM | POA: Diagnosis not present

## 2021-07-06 DIAGNOSIS — N2581 Secondary hyperparathyroidism of renal origin: Secondary | ICD-10-CM | POA: Diagnosis not present

## 2021-07-06 DIAGNOSIS — N186 End stage renal disease: Secondary | ICD-10-CM | POA: Diagnosis not present

## 2021-07-08 DIAGNOSIS — N186 End stage renal disease: Secondary | ICD-10-CM | POA: Diagnosis not present

## 2021-07-08 DIAGNOSIS — Z23 Encounter for immunization: Secondary | ICD-10-CM | POA: Diagnosis not present

## 2021-07-08 DIAGNOSIS — D509 Iron deficiency anemia, unspecified: Secondary | ICD-10-CM | POA: Diagnosis not present

## 2021-07-08 DIAGNOSIS — Z992 Dependence on renal dialysis: Secondary | ICD-10-CM | POA: Diagnosis not present

## 2021-07-08 DIAGNOSIS — N2581 Secondary hyperparathyroidism of renal origin: Secondary | ICD-10-CM | POA: Diagnosis not present

## 2021-07-08 DIAGNOSIS — D631 Anemia in chronic kidney disease: Secondary | ICD-10-CM | POA: Diagnosis not present

## 2021-07-10 DIAGNOSIS — N186 End stage renal disease: Secondary | ICD-10-CM | POA: Diagnosis not present

## 2021-07-10 DIAGNOSIS — D631 Anemia in chronic kidney disease: Secondary | ICD-10-CM | POA: Diagnosis not present

## 2021-07-10 DIAGNOSIS — Z992 Dependence on renal dialysis: Secondary | ICD-10-CM | POA: Diagnosis not present

## 2021-07-13 DIAGNOSIS — D631 Anemia in chronic kidney disease: Secondary | ICD-10-CM | POA: Diagnosis not present

## 2021-07-13 DIAGNOSIS — N186 End stage renal disease: Secondary | ICD-10-CM | POA: Diagnosis not present

## 2021-07-13 DIAGNOSIS — Z992 Dependence on renal dialysis: Secondary | ICD-10-CM | POA: Diagnosis not present

## 2021-07-15 DIAGNOSIS — D631 Anemia in chronic kidney disease: Secondary | ICD-10-CM | POA: Diagnosis not present

## 2021-07-15 DIAGNOSIS — Z992 Dependence on renal dialysis: Secondary | ICD-10-CM | POA: Diagnosis not present

## 2021-07-15 DIAGNOSIS — N186 End stage renal disease: Secondary | ICD-10-CM | POA: Diagnosis not present

## 2021-07-17 DIAGNOSIS — Z992 Dependence on renal dialysis: Secondary | ICD-10-CM | POA: Diagnosis not present

## 2021-07-17 DIAGNOSIS — N186 End stage renal disease: Secondary | ICD-10-CM | POA: Diagnosis not present

## 2021-07-17 DIAGNOSIS — D631 Anemia in chronic kidney disease: Secondary | ICD-10-CM | POA: Diagnosis not present

## 2021-07-20 DIAGNOSIS — N186 End stage renal disease: Secondary | ICD-10-CM | POA: Diagnosis not present

## 2021-07-20 DIAGNOSIS — Z992 Dependence on renal dialysis: Secondary | ICD-10-CM | POA: Diagnosis not present

## 2021-07-20 DIAGNOSIS — D631 Anemia in chronic kidney disease: Secondary | ICD-10-CM | POA: Diagnosis not present

## 2021-07-22 DIAGNOSIS — Z992 Dependence on renal dialysis: Secondary | ICD-10-CM | POA: Diagnosis not present

## 2021-07-22 DIAGNOSIS — D631 Anemia in chronic kidney disease: Secondary | ICD-10-CM | POA: Diagnosis not present

## 2021-07-22 DIAGNOSIS — N186 End stage renal disease: Secondary | ICD-10-CM | POA: Diagnosis not present

## 2021-07-24 DIAGNOSIS — Z992 Dependence on renal dialysis: Secondary | ICD-10-CM | POA: Diagnosis not present

## 2021-07-24 DIAGNOSIS — N186 End stage renal disease: Secondary | ICD-10-CM | POA: Diagnosis not present

## 2021-07-24 DIAGNOSIS — D631 Anemia in chronic kidney disease: Secondary | ICD-10-CM | POA: Diagnosis not present

## 2021-07-27 DIAGNOSIS — Z992 Dependence on renal dialysis: Secondary | ICD-10-CM | POA: Diagnosis not present

## 2021-07-27 DIAGNOSIS — D631 Anemia in chronic kidney disease: Secondary | ICD-10-CM | POA: Diagnosis not present

## 2021-07-27 DIAGNOSIS — N186 End stage renal disease: Secondary | ICD-10-CM | POA: Diagnosis not present

## 2021-07-29 DIAGNOSIS — Z992 Dependence on renal dialysis: Secondary | ICD-10-CM | POA: Diagnosis not present

## 2021-07-29 DIAGNOSIS — N186 End stage renal disease: Secondary | ICD-10-CM | POA: Diagnosis not present

## 2021-07-29 DIAGNOSIS — D631 Anemia in chronic kidney disease: Secondary | ICD-10-CM | POA: Diagnosis not present

## 2021-07-31 DIAGNOSIS — N186 End stage renal disease: Secondary | ICD-10-CM | POA: Diagnosis not present

## 2021-07-31 DIAGNOSIS — Z992 Dependence on renal dialysis: Secondary | ICD-10-CM | POA: Diagnosis not present

## 2021-07-31 DIAGNOSIS — D631 Anemia in chronic kidney disease: Secondary | ICD-10-CM | POA: Diagnosis not present

## 2021-08-03 DIAGNOSIS — D631 Anemia in chronic kidney disease: Secondary | ICD-10-CM | POA: Diagnosis not present

## 2021-08-03 DIAGNOSIS — N186 End stage renal disease: Secondary | ICD-10-CM | POA: Diagnosis not present

## 2021-08-03 DIAGNOSIS — Z992 Dependence on renal dialysis: Secondary | ICD-10-CM | POA: Diagnosis not present

## 2021-08-05 DIAGNOSIS — Z992 Dependence on renal dialysis: Secondary | ICD-10-CM | POA: Diagnosis not present

## 2021-08-05 DIAGNOSIS — D631 Anemia in chronic kidney disease: Secondary | ICD-10-CM | POA: Diagnosis not present

## 2021-08-05 DIAGNOSIS — N186 End stage renal disease: Secondary | ICD-10-CM | POA: Diagnosis not present

## 2021-08-07 DIAGNOSIS — D631 Anemia in chronic kidney disease: Secondary | ICD-10-CM | POA: Diagnosis not present

## 2021-08-07 DIAGNOSIS — N186 End stage renal disease: Secondary | ICD-10-CM | POA: Diagnosis not present

## 2021-08-07 DIAGNOSIS — Z992 Dependence on renal dialysis: Secondary | ICD-10-CM | POA: Diagnosis not present

## 2021-08-08 IMAGING — US US RENAL
1 series · 14 of 23 positions shown · non-contrast
Comparison: CT 05/28/2020.

CLINICAL DATA: Right nephrectomy.

EXAM:
RENAL / URINARY TRACT ULTRASOUND COMPLETE

[Series 1: us renal · 14 of 23 slices shown]
[im 1/23]
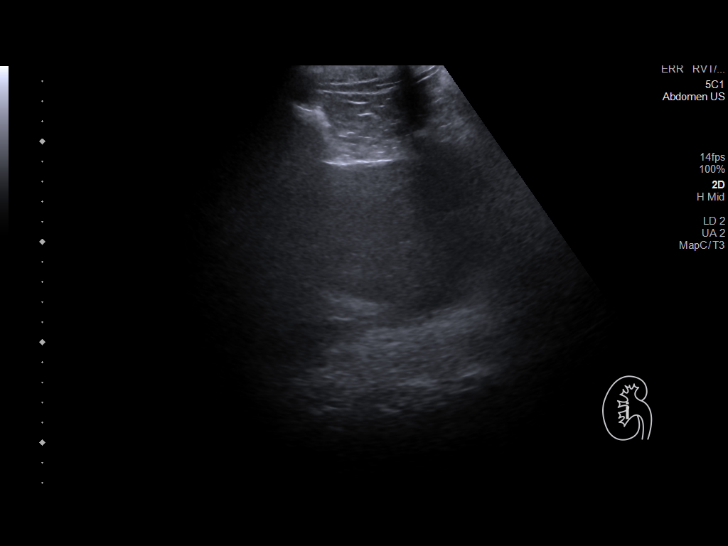
[im 3/23]
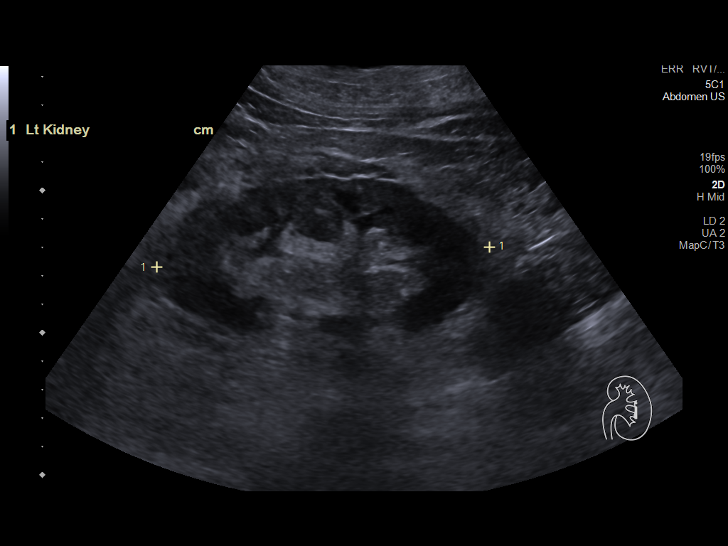
[im 5/23]
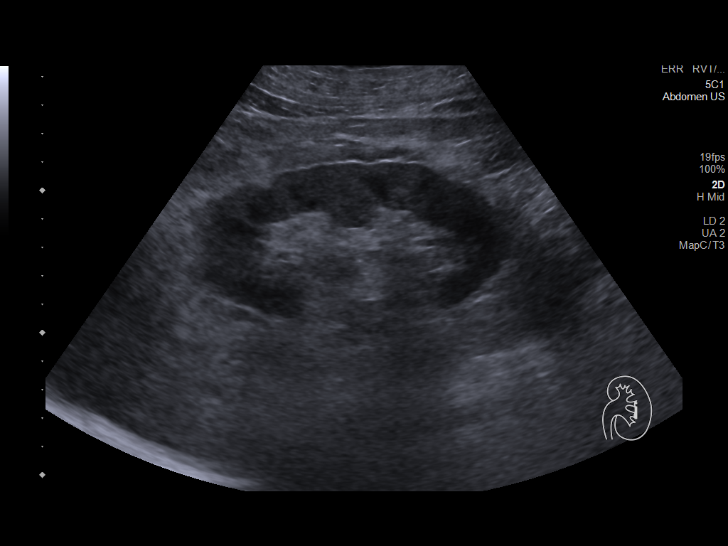
[im 6/23]
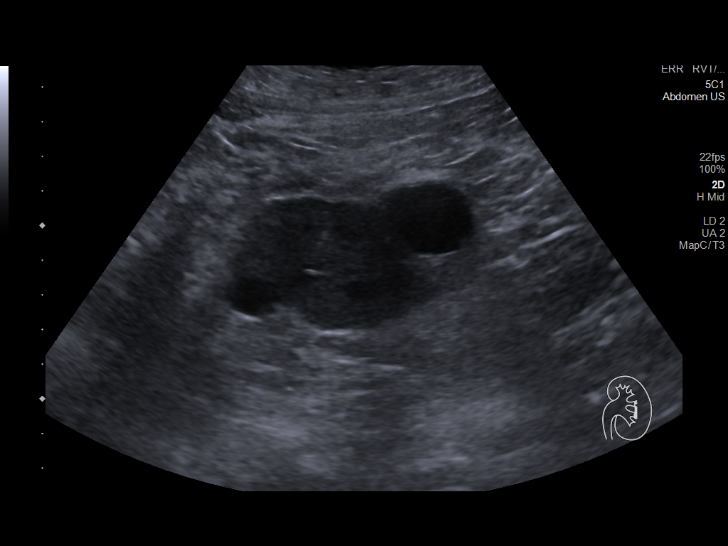
[im 8/23]
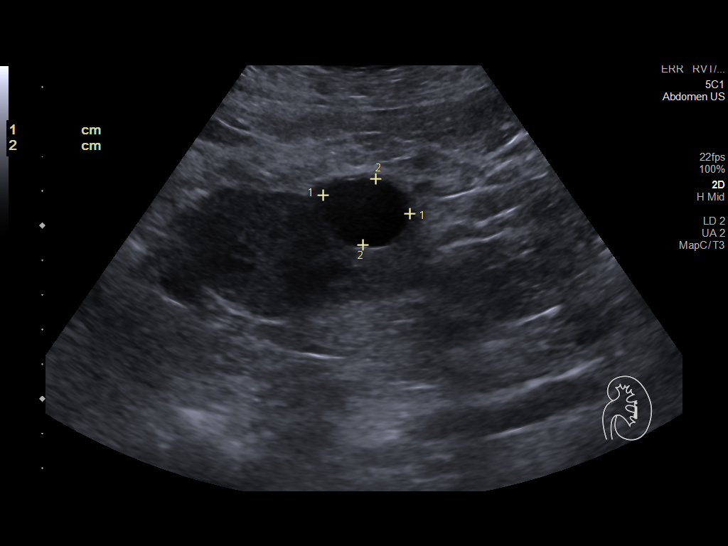
[im 10/23]
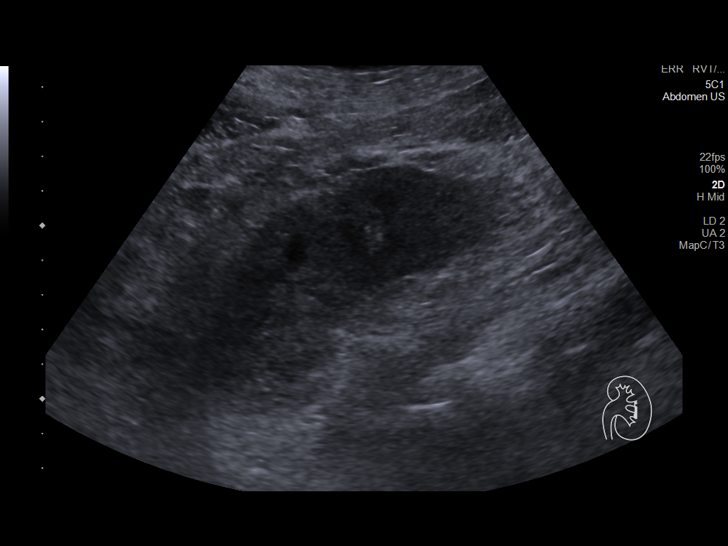
[im 11/23]
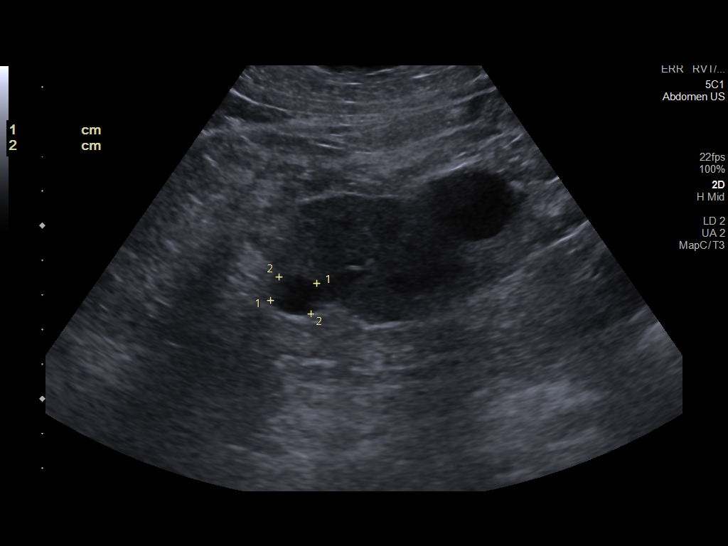
[im 13/23]
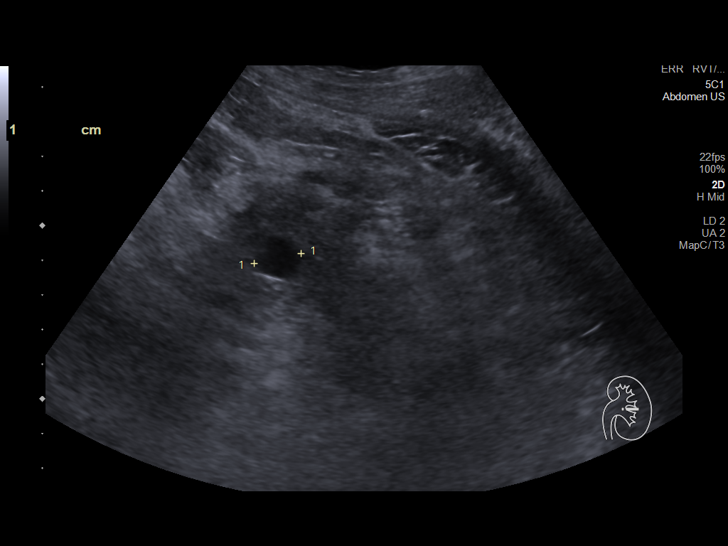
[im 14/23]
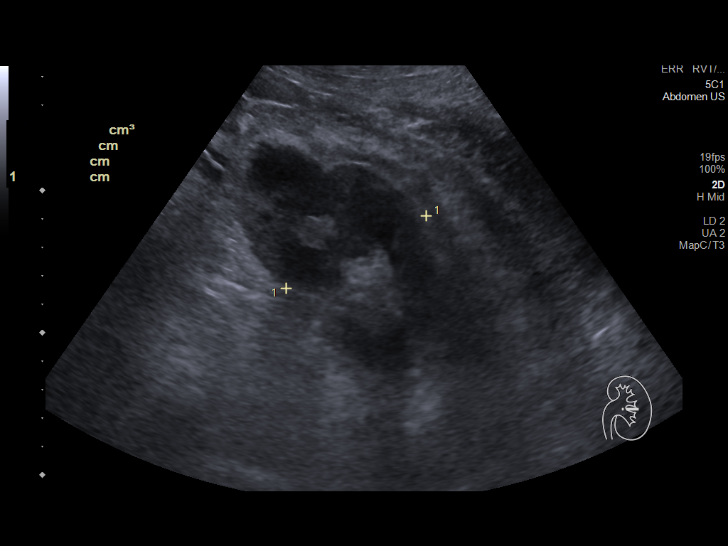
[im 16/23]
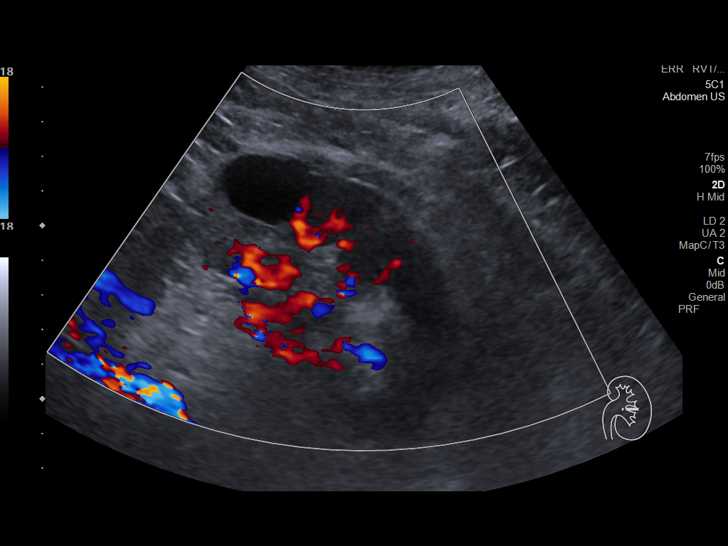
[im 18/23]
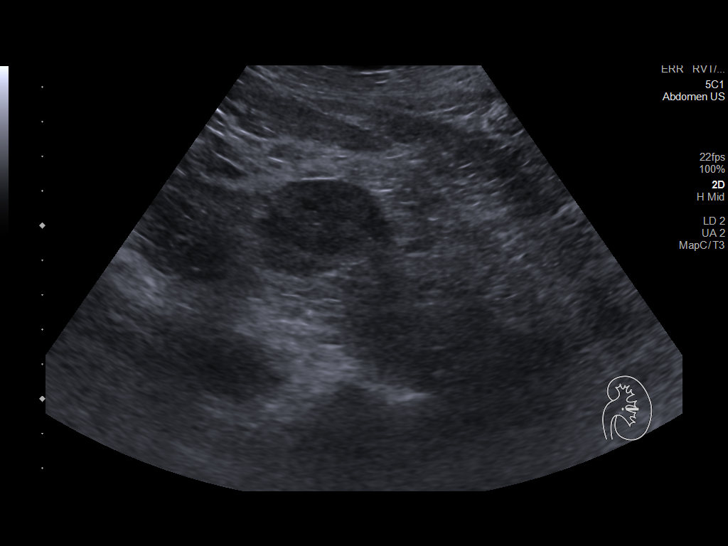
[im 19/23]
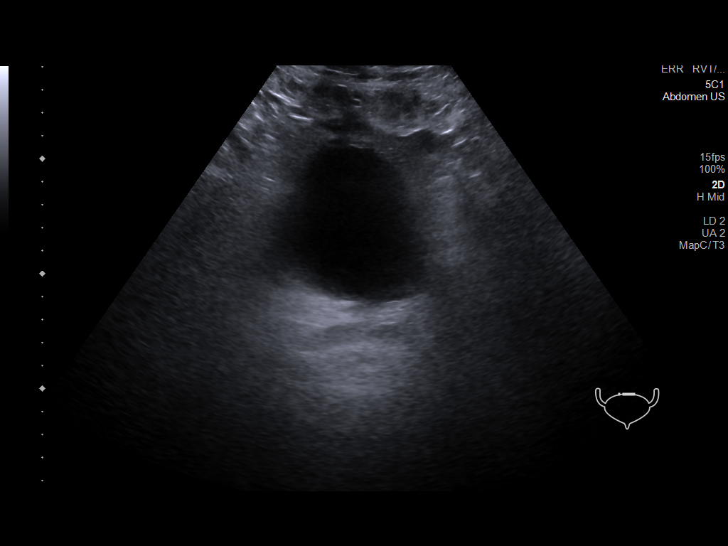
[im 21/23]
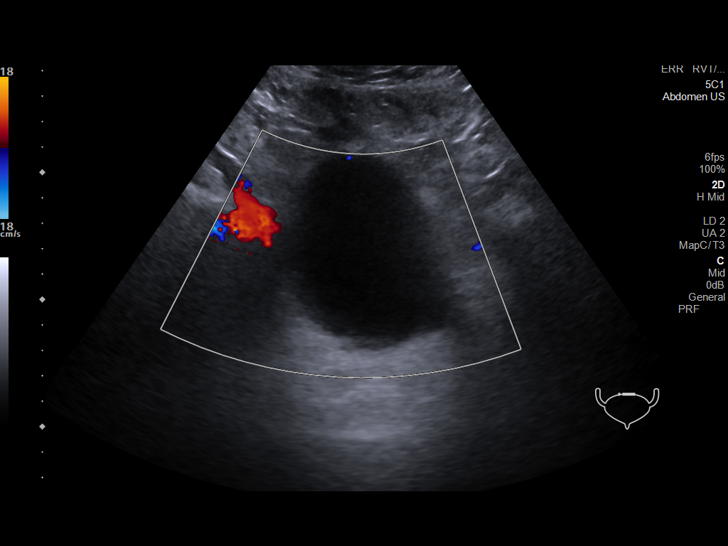
[im 23/23]
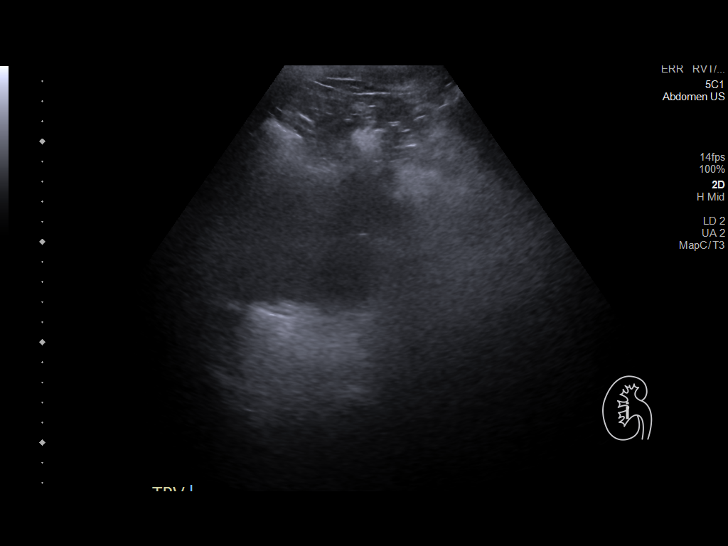

[14 of 23 positions shown; findings below may reference images not displayed]

FINDINGS: Right Kidney:

Renal measurements: Right nephrectomy.  Scratch

Left Kidney:

Renal measurements: 12.0 x 5.9 x 5.5 cm = volume: 196.0 mL.
Echogenicity within normal limits. 2.6 and 1.4 cm simple cysts. No
hydronephrosis visualized.

Bladder:

Appears normal for degree of bladder distention. Ureteral jets not
visualized.

Other:

None.
IMPRESSION: 1. Right nephrectomy.
2. Two simple cysts left kidney. No solid mass. No hydronephrosis or
bladder distention.

## 2021-08-10 DIAGNOSIS — N2581 Secondary hyperparathyroidism of renal origin: Secondary | ICD-10-CM | POA: Diagnosis not present

## 2021-08-10 DIAGNOSIS — N186 End stage renal disease: Secondary | ICD-10-CM | POA: Diagnosis not present

## 2021-08-10 DIAGNOSIS — Z992 Dependence on renal dialysis: Secondary | ICD-10-CM | POA: Diagnosis not present

## 2021-08-10 DIAGNOSIS — D509 Iron deficiency anemia, unspecified: Secondary | ICD-10-CM | POA: Diagnosis not present

## 2021-08-10 DIAGNOSIS — D631 Anemia in chronic kidney disease: Secondary | ICD-10-CM | POA: Diagnosis not present

## 2021-08-10 DIAGNOSIS — Z23 Encounter for immunization: Secondary | ICD-10-CM | POA: Diagnosis not present

## 2021-08-10 DIAGNOSIS — N250 Renal osteodystrophy: Secondary | ICD-10-CM | POA: Diagnosis not present

## 2021-08-12 DIAGNOSIS — D631 Anemia in chronic kidney disease: Secondary | ICD-10-CM | POA: Diagnosis not present

## 2021-08-12 DIAGNOSIS — Z992 Dependence on renal dialysis: Secondary | ICD-10-CM | POA: Diagnosis not present

## 2021-08-12 DIAGNOSIS — N2581 Secondary hyperparathyroidism of renal origin: Secondary | ICD-10-CM | POA: Diagnosis not present

## 2021-08-12 DIAGNOSIS — D509 Iron deficiency anemia, unspecified: Secondary | ICD-10-CM | POA: Diagnosis not present

## 2021-08-12 DIAGNOSIS — N250 Renal osteodystrophy: Secondary | ICD-10-CM | POA: Diagnosis not present

## 2021-08-12 DIAGNOSIS — N186 End stage renal disease: Secondary | ICD-10-CM | POA: Diagnosis not present

## 2021-08-14 DIAGNOSIS — D631 Anemia in chronic kidney disease: Secondary | ICD-10-CM | POA: Diagnosis not present

## 2021-08-14 DIAGNOSIS — Z992 Dependence on renal dialysis: Secondary | ICD-10-CM | POA: Diagnosis not present

## 2021-08-14 DIAGNOSIS — N2581 Secondary hyperparathyroidism of renal origin: Secondary | ICD-10-CM | POA: Diagnosis not present

## 2021-08-14 DIAGNOSIS — N250 Renal osteodystrophy: Secondary | ICD-10-CM | POA: Diagnosis not present

## 2021-08-14 DIAGNOSIS — N186 End stage renal disease: Secondary | ICD-10-CM | POA: Diagnosis not present

## 2021-08-14 DIAGNOSIS — D509 Iron deficiency anemia, unspecified: Secondary | ICD-10-CM | POA: Diagnosis not present

## 2021-08-17 DIAGNOSIS — D631 Anemia in chronic kidney disease: Secondary | ICD-10-CM | POA: Diagnosis not present

## 2021-08-17 DIAGNOSIS — N250 Renal osteodystrophy: Secondary | ICD-10-CM | POA: Diagnosis not present

## 2021-08-17 DIAGNOSIS — Z794 Long term (current) use of insulin: Secondary | ICD-10-CM | POA: Diagnosis not present

## 2021-08-17 DIAGNOSIS — N186 End stage renal disease: Secondary | ICD-10-CM | POA: Diagnosis not present

## 2021-08-17 DIAGNOSIS — E119 Type 2 diabetes mellitus without complications: Secondary | ICD-10-CM | POA: Diagnosis not present

## 2021-08-17 DIAGNOSIS — N2581 Secondary hyperparathyroidism of renal origin: Secondary | ICD-10-CM | POA: Diagnosis not present

## 2021-08-17 DIAGNOSIS — Z992 Dependence on renal dialysis: Secondary | ICD-10-CM | POA: Diagnosis not present

## 2021-08-17 DIAGNOSIS — D509 Iron deficiency anemia, unspecified: Secondary | ICD-10-CM | POA: Diagnosis not present

## 2021-08-19 ENCOUNTER — Ambulatory Visit (HOSPITAL_COMMUNITY)
Admission: RE | Admit: 2021-08-19 | Discharge: 2021-08-19 | Disposition: A | Discharge status: Home / Self Care | Payer: Medicare Other | Source: Ambulatory Visit | Attending: Urology | Admitting: Urology

## 2021-08-19 ENCOUNTER — Other Ambulatory Visit: Payer: Self-pay

## 2021-08-19 DIAGNOSIS — Z992 Dependence on renal dialysis: Secondary | ICD-10-CM | POA: Diagnosis not present

## 2021-08-19 DIAGNOSIS — N3001 Acute cystitis with hematuria: Secondary | ICD-10-CM | POA: Insufficient documentation

## 2021-08-19 DIAGNOSIS — N186 End stage renal disease: Secondary | ICD-10-CM | POA: Diagnosis not present

## 2021-08-19 DIAGNOSIS — N3289 Other specified disorders of bladder: Secondary | ICD-10-CM | POA: Diagnosis not present

## 2021-08-19 DIAGNOSIS — N2581 Secondary hyperparathyroidism of renal origin: Secondary | ICD-10-CM | POA: Diagnosis not present

## 2021-08-19 DIAGNOSIS — N250 Renal osteodystrophy: Secondary | ICD-10-CM | POA: Diagnosis not present

## 2021-08-19 DIAGNOSIS — C641 Malignant neoplasm of right kidney, except renal pelvis: Secondary | ICD-10-CM | POA: Insufficient documentation

## 2021-08-19 DIAGNOSIS — Z8546 Personal history of malignant neoplasm of prostate: Secondary | ICD-10-CM | POA: Diagnosis not present

## 2021-08-19 DIAGNOSIS — N281 Cyst of kidney, acquired: Secondary | ICD-10-CM | POA: Diagnosis not present

## 2021-08-19 DIAGNOSIS — D631 Anemia in chronic kidney disease: Secondary | ICD-10-CM | POA: Diagnosis not present

## 2021-08-19 DIAGNOSIS — D509 Iron deficiency anemia, unspecified: Secondary | ICD-10-CM | POA: Diagnosis not present

## 2021-08-21 DIAGNOSIS — D509 Iron deficiency anemia, unspecified: Secondary | ICD-10-CM | POA: Diagnosis not present

## 2021-08-21 DIAGNOSIS — N250 Renal osteodystrophy: Secondary | ICD-10-CM | POA: Diagnosis not present

## 2021-08-21 DIAGNOSIS — N2581 Secondary hyperparathyroidism of renal origin: Secondary | ICD-10-CM | POA: Diagnosis not present

## 2021-08-21 DIAGNOSIS — N186 End stage renal disease: Secondary | ICD-10-CM | POA: Diagnosis not present

## 2021-08-21 DIAGNOSIS — Z992 Dependence on renal dialysis: Secondary | ICD-10-CM | POA: Diagnosis not present

## 2021-08-21 DIAGNOSIS — D631 Anemia in chronic kidney disease: Secondary | ICD-10-CM | POA: Diagnosis not present

## 2021-08-24 DIAGNOSIS — D509 Iron deficiency anemia, unspecified: Secondary | ICD-10-CM | POA: Diagnosis not present

## 2021-08-24 DIAGNOSIS — D631 Anemia in chronic kidney disease: Secondary | ICD-10-CM | POA: Diagnosis not present

## 2021-08-24 DIAGNOSIS — N2581 Secondary hyperparathyroidism of renal origin: Secondary | ICD-10-CM | POA: Diagnosis not present

## 2021-08-24 DIAGNOSIS — N250 Renal osteodystrophy: Secondary | ICD-10-CM | POA: Diagnosis not present

## 2021-08-24 DIAGNOSIS — Z992 Dependence on renal dialysis: Secondary | ICD-10-CM | POA: Diagnosis not present

## 2021-08-24 DIAGNOSIS — N186 End stage renal disease: Secondary | ICD-10-CM | POA: Diagnosis not present

## 2021-08-25 NOTE — Progress Notes (Signed)
Sent via mychart

## 2021-08-26 ENCOUNTER — Encounter: Payer: Self-pay | Admitting: Urology

## 2021-08-26 ENCOUNTER — Ambulatory Visit (INDEPENDENT_AMBULATORY_CARE_PROVIDER_SITE_OTHER): Payer: Medicare Other | Admitting: Urology

## 2021-08-26 ENCOUNTER — Other Ambulatory Visit: Payer: Self-pay

## 2021-08-26 VITALS — BP 113/62 | HR 80

## 2021-08-26 DIAGNOSIS — Z992 Dependence on renal dialysis: Secondary | ICD-10-CM | POA: Diagnosis not present

## 2021-08-26 DIAGNOSIS — C641 Malignant neoplasm of right kidney, except renal pelvis: Secondary | ICD-10-CM | POA: Diagnosis not present

## 2021-08-26 DIAGNOSIS — N390 Urinary tract infection, site not specified: Secondary | ICD-10-CM

## 2021-08-26 DIAGNOSIS — N2581 Secondary hyperparathyroidism of renal origin: Secondary | ICD-10-CM | POA: Diagnosis not present

## 2021-08-26 DIAGNOSIS — R3912 Poor urinary stream: Secondary | ICD-10-CM | POA: Diagnosis not present

## 2021-08-26 DIAGNOSIS — N186 End stage renal disease: Secondary | ICD-10-CM | POA: Diagnosis not present

## 2021-08-26 DIAGNOSIS — D509 Iron deficiency anemia, unspecified: Secondary | ICD-10-CM | POA: Diagnosis not present

## 2021-08-26 DIAGNOSIS — D631 Anemia in chronic kidney disease: Secondary | ICD-10-CM | POA: Diagnosis not present

## 2021-08-26 DIAGNOSIS — N250 Renal osteodystrophy: Secondary | ICD-10-CM | POA: Diagnosis not present

## 2021-08-26 DIAGNOSIS — R300 Dysuria: Secondary | ICD-10-CM

## 2021-08-26 MED ORDER — CIPROFLOXACIN HCL 250 MG PO TABS: 250.0000 mg | ORAL_TABLET | Freq: Every day | ORAL | 0 refills | Status: DC

## 2021-08-26 MED ORDER — TAMSULOSIN HCL 0.4 MG PO CAPS: 0.4000 mg | ORAL_CAPSULE | Freq: Every day | ORAL | 11 refills | Status: DC

## 2021-08-26 NOTE — Progress Notes (Signed)
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 Ct from 10/12 shows no evidence of RCC recurrance. He is on dialysis and makes 2-3 cups of urine daily.  He remains on flomax 0.4mg  daily which improved his weak urinary stream. IPSS 15 QOl 3. No other complaitns today   PMH: Past Medical History:  Diagnosis Date   Arthritis    Cancer ([place])    prostate   Cardiomyopathy    secondary   CKD (chronic kidney disease)    DM2 (diabetes mellitus, type 2) (HCC)    Gout    HTN (hypertension)    unspec   Hypercholesterolemia    Nonischemic cardiomyopathy ([place])    a. EF 40-50% by most recent 2-D echo b. EF 25% by cardiac cath in 2004   Overweight(278.02)     Surgical History: Past Surgical History:  Procedure Laterality Date   AV FISTULA PLACEMENT Left 06/06/2020   Procedure: LEFT ARM ARTERIOVENOUS (AV) GRAFT USING 45CM GORTEX;  Surgeon: [name], MD;  Location: [facility];  Service: Vascular;  Laterality: Left;   COLONOSCOPY     HERNIA REPAIR     INSERTION OF DIALYSIS CATHETER Right 06/06/2020   Procedure: INSERTION OF DIALYSIS CATHETER USING 23CM DOUBLE LUMEN CATHETER;  Surgeon: [name], MD;  Location: [facility];  Service: Vascular;  Laterality: Right;   IR FLUORO GUIDE CV LINE RIGHT  05/20/2020   IR US GUIDE VASC ACCESS RIGHT  05/20/2020   KNEE ARTHROPLASTY Left 08/08/2017   Procedure: LEFT TOTAL KNEE ARTHROPLASTY WITH COMPUTER NAVIGATION;  Surgeon: Rod Can, MD;  Location: [facility];  Service: Orthopedics;  Laterality: Left;  Needs RNFA   KNEE ARTHROPLASTY Right 11/17/2017   Procedure: RIGHT TOTAL KNEE ARTHROPLASTY WITH COMPUTER NAVIGATION;  Surgeon: Rod Can, MD;  Location: [facility];  Service: Orthopedics;  Laterality: Right;  NEEDS RNFA   ROBOT ASSISTED LAPAROSCOPIC  NEPHRECTOMY Right 05/16/2020   Procedure: XI ROBOTIC ASSISTED LAPAROSCOPIC NEPHRECTOMY;  Surgeon: Cleon Gustin, MD;  Location: [facility];  Service: Urology;  Laterality: Right;  2.5 hrs    Home Medications:  Allergies as of 08/26/2021       Reactions   No Known Allergies         Medication List        Accurate as of August 26, 2021  2:26 PM. If you have any questions, ask your nurse or doctor.          acetaminophen 325 MG tablet Commonly known as: TYLENOL Take 1-2 tablets (325-650 mg total) by mouth every 4 (four) hours as needed for mild pain.   albuterol 108 (90 Base) MCG/ACT inhaler Commonly known as: VENTOLIN HFA Inhale 1-2 puffs into the lungs every 6 (six) hours as needed for wheezing or shortness of breath.   allopurinol 300 MG tablet Commonly known as: ZYLOPRIM Take 300 mg by mouth daily.   atorvastatin 40 MG tablet Commonly known as: LIPITOR Take 1 tablet (40 mg total) by mouth daily.   budesonide-formoterol 160-4.5 MCG/ACT inhaler Commonly known as: SYMBICORT Inhale 2 puffs into the lungs 2 (two) times daily.   calcium acetate 667 MG capsule Commonly known as: PHOSLO Take 3 capsules (2,001 mg total) by mouth 3 (three) times daily with meals.  Centrum Silver 50+Men Tabs Take 1 tablet by mouth daily.   cetirizine 10 MG tablet Commonly known as: ZYRTEC Take by mouth.   econazole nitrate 1 % cream Apply topically 2 (two) times daily.   insulin lispro 100 UNIT/ML KwikPen Commonly known as: HUMALOG Inject into the skin.   linaclotide 290 MCG Caps capsule Commonly known as: LINZESS Take by mouth.   loratadine 10 MG tablet Commonly known as: CLARITIN Take 10 mg by mouth daily.   metoprolol tartrate 25 MG tablet Commonly known as: LOPRESSOR Take 0.5 tablets (12.5 mg total) by mouth 2 (two) times daily.   midodrine 10 MG tablet Commonly known as: PROAMATINE TAKE 1 TABLET BY MOUTH THREE TIMES DAILY WITH MEALS   multivitamin Tabs  tablet Take 1 tablet by mouth at bedtime.   senna 8.6 MG tablet Commonly known as: SENOKOT Take 1 tablet by mouth daily as needed for constipation.   silver sulfADIAZINE 1 % cream Commonly known as: SILVADENE Apply to affected area daily   tamsulosin 0.4 MG Caps capsule Commonly known as: FLOMAX Take 1 capsule (0.4 mg total) by mouth daily.   Vitamin D 50 MCG (2000 UT) tablet Take 1 tablet (2,000 Units total) by mouth daily.        Allergies:  Allergies  Allergen Reactions   No Known Allergies     Family History: Family History  Problem Relation Age of Onset   Hypertension Mother    Kidney disease Mother    Heart disease Father    Kidney disease Other     Social History:  reports that he quit smoking about 36 years ago. His smoking use included cigarettes. He started smoking about 55 years ago. He has a 10.00 pack-year smoking history. He has never used smokeless tobacco. He reports that he does not drink alcohol and does not use drugs.  ROS: All other review of systems were reviewed and are negative except what is noted above in HPI  Physical Exam: BP 113/62   Pulse 80   Constitutional:  Alert and oriented, No acute distress. HEENT: [place] AT, moist mucus membranes.  Trachea midline, no masses. Cardiovascular: No clubbing, cyanosis, or edema. Respiratory: Normal respiratory effort, no increased work of breathing. GI: Abdomen is soft, nontender, nondistended, no abdominal masses GU: No CVA tenderness.  Lymph: No cervical or inguinal lymphadenopathy. Skin: No rashes, bruises or suspicious lesions. Neurologic: Grossly intact, no focal deficits, moving all 4 extremities. Psychiatric: Normal mood and affect.  Laboratory Data: Lab Results  Component Value Date   WBC 13.5 (H) 06/24/2020   HGB 9.7 (L) 06/24/2020   HCT 32.2 (L) 06/24/2020   MCV 100.3 (H) 06/24/2020   PLT 183 06/24/2020    Lab Results  Component Value Date   CREATININE 11.00 (H) 06/24/2020     No results found for: PSA  No results found for: TESTOSTERONE  Lab Results  Component Value Date   HGBA1C 6.2 (H) 05/13/2020    Urinalysis    Component Value Date/Time   APPEARANCEUR Clear 04/09/2021 1325   GLUCOSEU Negative 04/09/2021 1325   BILIRUBINUR Negative 04/09/2021 1325   PROTEINUR 2+ (A) 04/09/2021 1325   UROBILINOGEN 0.2 02/13/2020 1122   NITRITE Negative 04/09/2021 1325   LEUKOCYTESUR 1+ (A) 04/09/2021 1325    Lab Results  Component Value Date   LABMICR See below: 04/09/2021   WBCUA >30 (A) 04/09/2021   LABEPIT 0-10 04/09/2021   MUCUS Present 04/09/2021   BACTERIA Few 04/09/2021    Pertinent  Imaging: CT stone study 10/12: Images reviewed and discussed with the patient  Results for orders placed during the hospital encounter of 06/10/20  DG Abd 1 View  Narrative CLINICAL DATA:  Abdominal distention.  EXAM: ABDOMEN - 1 VIEW  COMPARISON:  05/30/2020  FINDINGS: Nasogastric tube has been removed. Bowel gas pattern is nonobstructed. No evidence for free intraperitoneal air or organomegaly.  IMPRESSION: Nonobstructive bowel gas pattern.   Electronically Signed By: Nolon Nations M.D. On: 06/14/2020 10:14  No results found for this or any previous visit.  No results found for this or any previous visit.  No results found for this or any previous visit.  Results for orders placed during the hospital encounter of 02/02/21  Ultrasound renal complete  Narrative CLINICAL DATA:  Right nephrectomy.  EXAM: RENAL / URINARY TRACT ULTRASOUND COMPLETE  COMPARISON:  CT 05/28/2020.  FINDINGS: Right Kidney:  Renal measurements: Right nephrectomy.  Scratch  Left Kidney:  Renal measurements: 12.0 x 5.9 x 5.5 cm = volume: 196.0 mL. Echogenicity within normal limits. 2.6 and 1.4 cm simple cysts. No hydronephrosis visualized.  Bladder:  Appears normal for degree of bladder distention. Ureteral jets  not visualized.  Other:  None.  IMPRESSION: 1. Right nephrectomy. 2. Two simple cysts left kidney. No solid mass. No hydronephrosis or bladder distention.   Electronically Signed By: Marcello Moores  Register On: 02/03/2021 06:19  No results found for this or any previous visit.  No results found for this or any previous visit.  Results for orders placed during the hospital encounter of 08/19/21  CT RENAL STONE STUDY  Narrative CLINICAL DATA:  Follow-up right-sided renal cell carcinoma, status post right nephrectomy, additional history of prostate cancer  EXAM: CT ABDOMEN AND PELVIS WITHOUT CONTRAST  TECHNIQUE: Multidetector CT imaging of the abdomen and pelvis was performed following the standard protocol without IV contrast.  COMPARISON:  05/28/2020  FINDINGS: Lower chest: No acute abnormality.  Coronary artery calcifications.  Hepatobiliary: No solid liver abnormality is seen. No gallstones, gallbladder wall thickening, or biliary dilatation.  Pancreas: Unremarkable. No pancreatic ductal dilatation or surrounding inflammatory changes.  Spleen: Normal in size without significant abnormality.  Adrenals/Urinary Tract: Adrenal glands are unremarkable. Redemonstrated postoperative findings right nephrectomy. No evidence of recurrent soft tissue. Partially exophytic benign simple cyst of the left kidney, which is otherwise normal. No hydronephrosis. Thickening of the decompressed urinary bladder, likely related to chronic outlet obstruction.  Stomach/Bowel: Stomach is within normal limits. Appendix appears normal. No evidence of bowel wall thickening, distention, or inflammatory changes.  Vascular/Lymphatic: Aortic atherosclerosis. No enlarged abdominal or pelvic lymph nodes.  Reproductive: Biopsy marking clips in the prostate.  Other: No abdominal wall hernia or abnormality. No abdominopelvic ascites.  Musculoskeletal: No acute or significant osseous  findings.  IMPRESSION: 1. Redemonstrated postoperative findings of right nephrectomy. No noncontrast evidence of recurrent soft tissue or metastatic disease in the abdomen or pelvis. 2. Thickening of the decompressed urinary bladder, likely related to chronic outlet obstruction. 3. Coronary artery disease.  Aortic Atherosclerosis (ICD10-I70.0).   Electronically Signed By: Delanna Ahmadi M.D. On: 08/21/2021 09:00   Assessment & Plan:    1. Dysuria -urine for culture  - Urinalysis, Routine w reflex microscopic  2. Weak urinary stream -continue flomax 0.4mg  daily - Urinalysis, Routine w reflex microscopic  3. Urinary tract infection without hematuria, site unspecified -urine for culture -cipro 250mg  daily for 7 days - Urinalysis, Routine w reflex microscopic  4. Hx of right RCC -RTC 6 months with CXR  No follow-ups on file.  Nicolette Bang, MD  [facility] [place]

## 2021-08-26 NOTE — Progress Notes (Signed)
Urological Symptom Review  Patient is experiencing the following symptoms: Burning/pain with urination Get up at night to urinate Erection problems (male only)   Review of Systems  Gastrointestinal (upper)  : Negative for upper GI symptoms  Gastrointestinal (lower) : Constipation  Constitutional : Negative for symptoms  Skin: Negative for skin symptoms  Eyes: Negative for eye symptoms  Ear/Nose/Throat : Negative for Ear/Nose/Throat symptoms  Hematologic/Lymphatic: Negative for Hematologic/Lymphatic symptoms  Cardiovascular : Negative for cardiovascular symptoms  Respiratory : Cough  Endocrine: Negative for endocrine symptoms  Musculoskeletal: Negative for musculoskeletal symptoms  Neurological: Dizziness  Psychologic: Negative for psychiatric symptoms

## 2021-08-26 NOTE — Patient Instructions (Signed)
Urinary Tract Infection, Adult °A urinary tract infection (UTI) is an infection of any part of the urinary tract. The urinary tract includes: °The kidneys. °The ureters. °The bladder. °The urethra. °These organs make, store, and get rid of pee (urine) in the body. °What are the causes? °This infection is caused by germs (bacteria) in your genital area. These germs grow and cause swelling (inflammation) of your urinary tract. °What increases the risk? °The following factors may make you more likely to develop this condition: °Using a small, thin tube (catheter) to drain pee. °Not being able to control when you pee or poop (incontinence). °Being male. If you are male, these things can increase the risk: °Using these methods to prevent pregnancy: °A medicine that kills sperm (spermicide). °A device that blocks sperm (diaphragm). °Having low levels of a male hormone (estrogen). °Being pregnant. °You are more likely to develop this condition if: °You have genes that add to your risk. °You are sexually active. °You take antibiotic medicines. °You have trouble peeing because of: °A prostate that is bigger than normal, if you are male. °A blockage in the part of your body that drains pee from the bladder. °A kidney stone. °A nerve condition that affects your bladder. °Not getting enough to drink. °Not peeing often enough. °You have other conditions, such as: °Diabetes. °A weak disease-fighting system (immune system). °Sickle cell disease. °Gout. °Injury of the spine. °What are the signs or symptoms? °Symptoms of this condition include: °Needing to pee right away. °Peeing small amounts often. °Pain or burning when peeing. °Blood in the pee. °Pee that smells bad or not like normal. °Trouble peeing. °Pee that is cloudy. °Fluid coming from the vagina, if you are male. °Pain in the belly or lower back. °Other symptoms include: °Vomiting. °Not feeling hungry. °Feeling mixed up (confused). This may be the first symptom in  older adults. °Being tired and grouchy (irritable). °A fever. °Watery poop (diarrhea). °How is this treated? °Taking antibiotic medicine. °Taking other medicines. °Drinking enough water. °In some cases, you may need to see a specialist. °Follow these instructions at home: °Medicines °Take over-the-counter and prescription medicines only as told by your doctor. °If you were prescribed an antibiotic medicine, take it as told by your doctor. Do not stop taking it even if you start to feel better. °General instructions °Make sure you: °Pee until your bladder is empty. °Do not hold pee for a long time. °Empty your bladder after sex. °Wipe from front to back after peeing or pooping if you are a male. Use each tissue one time when you wipe. °Drink enough fluid to keep your pee pale yellow. °Keep all follow-up visits. °Contact a doctor if: °You do not get better after 1-2 days. °Your symptoms go away and then come back. °Get help right away if: °You have very bad back pain. °You have very bad pain in your lower belly. °You have a fever. °You have chills. °You feeling like you will vomit or you vomit. °Summary °A urinary tract infection (UTI) is an infection of any part of the urinary tract. °This condition is caused by germs in your genital area. °There are many risk factors for a UTI. °Treatment includes antibiotic medicines. °Drink enough fluid to keep your pee pale yellow. °This information is not intended to replace advice given to you by your health care provider. Make sure you discuss any questions you have with your health care provider. °Document Revised: 06/06/2020 Document Reviewed: 06/06/2020 °Elsevier Patient Education © 2022   [employer] ° °

## 2021-08-27 LAB — URINALYSIS, ROUTINE W REFLEX MICROSCOPIC
Bilirubin, UA: NEGATIVE
Glucose, UA: NEGATIVE
Leukocytes,UA: NEGATIVE
Nitrite, UA: NEGATIVE
RBC, UA: NEGATIVE
Specific Gravity, UA: 1.02 (ref 1.005–1.030)
Urobilinogen, Ur: 0.2 mg/dL (ref 0.2–1.0)
pH, UA: 5.5 (ref 5.0–7.5)

## 2021-08-27 LAB — MICROSCOPIC EXAMINATION
RBC, Urine: NONE SEEN /hpf (ref 0–2)
Renal Epithel, UA: NONE SEEN /hpf

## 2021-08-28 DIAGNOSIS — D631 Anemia in chronic kidney disease: Secondary | ICD-10-CM | POA: Diagnosis not present

## 2021-08-28 DIAGNOSIS — N2581 Secondary hyperparathyroidism of renal origin: Secondary | ICD-10-CM | POA: Diagnosis not present

## 2021-08-28 DIAGNOSIS — Z992 Dependence on renal dialysis: Secondary | ICD-10-CM | POA: Diagnosis not present

## 2021-08-28 DIAGNOSIS — N250 Renal osteodystrophy: Secondary | ICD-10-CM | POA: Diagnosis not present

## 2021-08-28 DIAGNOSIS — N186 End stage renal disease: Secondary | ICD-10-CM | POA: Diagnosis not present

## 2021-08-28 DIAGNOSIS — D509 Iron deficiency anemia, unspecified: Secondary | ICD-10-CM | POA: Diagnosis not present

## 2021-08-28 LAB — URINE CULTURE

## 2021-08-31 DIAGNOSIS — N186 End stage renal disease: Secondary | ICD-10-CM | POA: Diagnosis not present

## 2021-08-31 DIAGNOSIS — D509 Iron deficiency anemia, unspecified: Secondary | ICD-10-CM | POA: Diagnosis not present

## 2021-08-31 DIAGNOSIS — D631 Anemia in chronic kidney disease: Secondary | ICD-10-CM | POA: Diagnosis not present

## 2021-08-31 DIAGNOSIS — Z992 Dependence on renal dialysis: Secondary | ICD-10-CM | POA: Diagnosis not present

## 2021-08-31 DIAGNOSIS — N2581 Secondary hyperparathyroidism of renal origin: Secondary | ICD-10-CM | POA: Diagnosis not present

## 2021-08-31 DIAGNOSIS — N250 Renal osteodystrophy: Secondary | ICD-10-CM | POA: Diagnosis not present

## 2021-09-02 DIAGNOSIS — N250 Renal osteodystrophy: Secondary | ICD-10-CM | POA: Diagnosis not present

## 2021-09-02 DIAGNOSIS — D509 Iron deficiency anemia, unspecified: Secondary | ICD-10-CM | POA: Diagnosis not present

## 2021-09-02 DIAGNOSIS — N2581 Secondary hyperparathyroidism of renal origin: Secondary | ICD-10-CM | POA: Diagnosis not present

## 2021-09-02 DIAGNOSIS — D631 Anemia in chronic kidney disease: Secondary | ICD-10-CM | POA: Diagnosis not present

## 2021-09-02 DIAGNOSIS — Z992 Dependence on renal dialysis: Secondary | ICD-10-CM | POA: Diagnosis not present

## 2021-09-02 DIAGNOSIS — N186 End stage renal disease: Secondary | ICD-10-CM | POA: Diagnosis not present

## 2021-09-02 NOTE — Progress Notes (Signed)
Sent via mychart

## 2021-09-04 DIAGNOSIS — D631 Anemia in chronic kidney disease: Secondary | ICD-10-CM | POA: Diagnosis not present

## 2021-09-04 DIAGNOSIS — N186 End stage renal disease: Secondary | ICD-10-CM | POA: Diagnosis not present

## 2021-09-04 DIAGNOSIS — N2581 Secondary hyperparathyroidism of renal origin: Secondary | ICD-10-CM | POA: Diagnosis not present

## 2021-09-04 DIAGNOSIS — D509 Iron deficiency anemia, unspecified: Secondary | ICD-10-CM | POA: Diagnosis not present

## 2021-09-04 DIAGNOSIS — N250 Renal osteodystrophy: Secondary | ICD-10-CM | POA: Diagnosis not present

## 2021-09-04 DIAGNOSIS — Z992 Dependence on renal dialysis: Secondary | ICD-10-CM | POA: Diagnosis not present

## 2021-09-07 DIAGNOSIS — D631 Anemia in chronic kidney disease: Secondary | ICD-10-CM | POA: Diagnosis not present

## 2021-09-07 DIAGNOSIS — E1140 Type 2 diabetes mellitus with diabetic neuropathy, unspecified: Secondary | ICD-10-CM | POA: Diagnosis not present

## 2021-09-07 DIAGNOSIS — N186 End stage renal disease: Secondary | ICD-10-CM | POA: Diagnosis not present

## 2021-09-07 DIAGNOSIS — E1151 Type 2 diabetes mellitus with diabetic peripheral angiopathy without gangrene: Secondary | ICD-10-CM | POA: Diagnosis not present

## 2021-09-07 DIAGNOSIS — N250 Renal osteodystrophy: Secondary | ICD-10-CM | POA: Diagnosis not present

## 2021-09-07 DIAGNOSIS — D509 Iron deficiency anemia, unspecified: Secondary | ICD-10-CM | POA: Diagnosis not present

## 2021-09-07 DIAGNOSIS — N2581 Secondary hyperparathyroidism of renal origin: Secondary | ICD-10-CM | POA: Diagnosis not present

## 2021-09-07 DIAGNOSIS — B351 Tinea unguium: Secondary | ICD-10-CM | POA: Diagnosis not present

## 2021-09-07 DIAGNOSIS — L110 Acquired keratosis follicularis: Secondary | ICD-10-CM | POA: Diagnosis not present

## 2021-09-07 DIAGNOSIS — Z992 Dependence on renal dialysis: Secondary | ICD-10-CM | POA: Diagnosis not present

## 2021-09-08 ENCOUNTER — Telehealth: Payer: Self-pay

## 2021-09-08 DIAGNOSIS — C641 Malignant neoplasm of right kidney, except renal pelvis: Secondary | ICD-10-CM

## 2021-09-08 NOTE — Telephone Encounter (Signed)
Would you like to send in another round of repeat urine culture?

## 2021-09-08 NOTE — Telephone Encounter (Signed)
Pt's wife called back to check on status of medication to be called in for infection that continues.  Thanks, Helene Kelp

## 2021-09-08 NOTE — Telephone Encounter (Signed)
Left a Voice Message:  Wife called to let Dr. Alyson Ingles know the medication for bladder infection didn't work.  He finished taking it and still has symptoms.  Please advise.  Call back:  [phone] Jerilynn Mages)   Thanks, Helene Kelp

## 2021-09-09 DIAGNOSIS — D631 Anemia in chronic kidney disease: Secondary | ICD-10-CM | POA: Diagnosis not present

## 2021-09-09 DIAGNOSIS — D509 Iron deficiency anemia, unspecified: Secondary | ICD-10-CM | POA: Diagnosis not present

## 2021-09-09 DIAGNOSIS — N186 End stage renal disease: Secondary | ICD-10-CM | POA: Diagnosis not present

## 2021-09-09 DIAGNOSIS — N2581 Secondary hyperparathyroidism of renal origin: Secondary | ICD-10-CM | POA: Diagnosis not present

## 2021-09-09 DIAGNOSIS — Z992 Dependence on renal dialysis: Secondary | ICD-10-CM | POA: Diagnosis not present

## 2021-09-09 MED ORDER — CIPROFLOXACIN HCL 250 MG PO TABS: 250.0000 mg | ORAL_TABLET | Freq: Every day | ORAL | 0 refills | Status: DC

## 2021-09-09 NOTE — Telephone Encounter (Signed)
Reviewed with Dr. Alyson Ingles, patient will take 1 more round of cipro 250mg  by mouth daily.   Reviewed with patient wife- voiced understanding.

## 2021-09-11 DIAGNOSIS — N2581 Secondary hyperparathyroidism of renal origin: Secondary | ICD-10-CM | POA: Diagnosis not present

## 2021-09-11 DIAGNOSIS — D631 Anemia in chronic kidney disease: Secondary | ICD-10-CM | POA: Diagnosis not present

## 2021-09-11 DIAGNOSIS — Z992 Dependence on renal dialysis: Secondary | ICD-10-CM | POA: Diagnosis not present

## 2021-09-11 DIAGNOSIS — D509 Iron deficiency anemia, unspecified: Secondary | ICD-10-CM | POA: Diagnosis not present

## 2021-09-11 DIAGNOSIS — N186 End stage renal disease: Secondary | ICD-10-CM | POA: Diagnosis not present

## 2021-09-14 ENCOUNTER — Ambulatory Visit (INDEPENDENT_AMBULATORY_CARE_PROVIDER_SITE_OTHER): Payer: Medicare Other | Admitting: Cardiology

## 2021-09-14 ENCOUNTER — Encounter: Payer: Self-pay | Admitting: Cardiology

## 2021-09-14 VITALS — BP 100/50 | HR 72 | Ht 74.0 in | Wt 225.0 lb

## 2021-09-14 DIAGNOSIS — N186 End stage renal disease: Secondary | ICD-10-CM | POA: Diagnosis not present

## 2021-09-14 DIAGNOSIS — I493 Ventricular premature depolarization: Secondary | ICD-10-CM

## 2021-09-14 DIAGNOSIS — D631 Anemia in chronic kidney disease: Secondary | ICD-10-CM | POA: Diagnosis not present

## 2021-09-14 DIAGNOSIS — Z992 Dependence on renal dialysis: Secondary | ICD-10-CM | POA: Diagnosis not present

## 2021-09-14 DIAGNOSIS — D509 Iron deficiency anemia, unspecified: Secondary | ICD-10-CM | POA: Diagnosis not present

## 2021-09-14 DIAGNOSIS — I428 Other cardiomyopathies: Secondary | ICD-10-CM

## 2021-09-14 DIAGNOSIS — N2581 Secondary hyperparathyroidism of renal origin: Secondary | ICD-10-CM | POA: Diagnosis not present

## 2021-09-14 DIAGNOSIS — I9789 Other postprocedural complications and disorders of the circulatory system, not elsewhere classified: Secondary | ICD-10-CM

## 2021-09-14 DIAGNOSIS — I4891 Unspecified atrial fibrillation: Secondary | ICD-10-CM | POA: Diagnosis not present

## 2021-09-14 NOTE — Patient Instructions (Signed)

## 2021-09-14 NOTE — Progress Notes (Signed)
Clinical Summary Todd Robertson is a 75 y.o.male seen today for follow up of the following medical problems.    1. NICM   - previously significant LV dysfunction with LVEF 25-30% in 2004, last echo 2011 shows normalized LVEF at 60%. Cath 2004 with no significant coronary disease  - 08/2017 echo LVEF 55-60%   05/2020 echo LVEF 45-50%.   Jan 2022 Echo: LVEF 29-92%, grade I diastolic dysfunction - on midodrine 10mg  tid.  - no SOB/DOE. No recent edema     2. HTN   - some ongoing issues with low bp's with HD but improved with higher dry weight - he is on midodrine as well     3. Hyperlipidemia - labs followed by pcp - he is on atorvastatin   4. Prostate Cancer - treated radiation at [facility]  - followed by urologist.    5. ESRD - on HD since July 2021   6. PVCs - episode of NSVT during knee replacement - repeat echo 08/2017 showed stable LVEF 55-60%   - no recent palpitaotns.    7. Renal cell carcinoma - s/p nephrectomy   8. Dilated aortic root - 07/2020 echo aortic root 41 mm - Jan 2022 echo 3.7 cm - essentialyl over time 3.7 to 4.1 based on echo measurements. Have not had strong indication for CTA   9. Post op Afib - issues after nephrectomy during 06/2020 admission. Admission also complicated by postop bleed, sepsis - was not started on anticoag due to postop bleeding  - no recent palpitations.   10. ESRD - goes MWF.    Past Medical History:  Diagnosis Date   Arthritis    Cancer [facility])    prostate   Cardiomyopathy    secondary   CKD (chronic kidney disease)    DM2 (diabetes mellitus, type 2) (HCC)    Gout    HTN (hypertension)    unspec   Hypercholesterolemia    Nonischemic cardiomyopathy ([place])    a. EF 40-50% by most recent 2-D echo b. EF 25% by cardiac cath in 2004   Overweight(278.02)      Allergies  Allergen Reactions   No Known Allergies      Current Outpatient Medications  Medication Sig Dispense Refill   acetaminophen (TYLENOL)  325 MG tablet Take 1-2 tablets (325-650 mg total) by mouth every 4 (four) hours as needed for mild pain.     albuterol (VENTOLIN HFA) 108 (90 Base) MCG/ACT inhaler Inhale 1-2 puffs into the lungs every 6 (six) hours as needed for wheezing or shortness of breath. 6.7 g 1   allopurinol (ZYLOPRIM) 300 MG tablet Take 300 mg by mouth daily.     atorvastatin (LIPITOR) 40 MG tablet Take 1 tablet (40 mg total) by mouth daily. 90 tablet 3   budesonide-formoterol (SYMBICORT) 160-4.5 MCG/ACT inhaler Inhale 2 puffs into the lungs 2 (two) times daily. 1 each 12   calcium acetate (PHOSLO) 667 MG capsule Take 3 capsules (2,001 mg total) by mouth 3 (three) times daily with meals. 180 capsule 1   cetirizine (ZYRTEC) 10 MG tablet Take by mouth.     Cholecalciferol (VITAMIN D) 50 MCG (2000 UT) tablet Take 1 tablet (2,000 Units total) by mouth daily. 30 tablet 0   ciprofloxacin (CIPRO) 250 MG tablet Take 1 tablet (250 mg total) by mouth daily. 7 tablet 0   insulin lispro (HUMALOG) 100 UNIT/ML KwikPen Inject into the skin.     metoprolol tartrate (LOPRESSOR) 25 MG tablet  Take 0.5 tablets (12.5 mg total) by mouth 2 (two) times daily. 90 tablet 3   midodrine (PROAMATINE) 10 MG tablet TAKE 1 TABLET BY MOUTH THREE TIMES DAILY WITH MEALS 270 tablet 2   Multiple Vitamins-Minerals (CENTRUM SILVER 50+MEN) TABS Take 1 tablet by mouth daily.     multivitamin (RENA-VIT) TABS tablet Take 1 tablet by mouth at bedtime. 30 tablet 0   Polyethylene Glycol 3350 (MIRALAX PO) Take by mouth as needed.     senna (SENOKOT) 8.6 MG tablet Take 1 tablet by mouth daily as needed for constipation.      tamsulosin (FLOMAX) 0.4 MG CAPS capsule Take 1 capsule (0.4 mg total) by mouth daily. 30 capsule 11   econazole nitrate 1 % cream Apply topically 2 (two) times daily. (Patient not taking: Reported on 09/14/2021)     linaclotide (LINZESS) 290 MCG CAPS capsule Take by mouth. (Patient not taking: Reported on 09/14/2021)     loratadine (CLARITIN) 10 MG  tablet Take 10 mg by mouth daily. (Patient not taking: Reported on 09/14/2021)     silver sulfADIAZINE (SILVADENE) 1 % cream Apply to affected area daily (Patient not taking: Reported on 09/14/2021)     No current facility-administered medications for this visit.     Past Surgical History:  Procedure Laterality Date   AV FISTULA PLACEMENT Left 06/06/2020   Procedure: LEFT ARM ARTERIOVENOUS (AV) GRAFT USING 45CM GORTEX;  Surgeon: [name], MD;  Location: [place];  Service: Vascular;  Laterality: Left;   COLONOSCOPY     HERNIA REPAIR     INSERTION OF DIALYSIS CATHETER Right 06/06/2020   Procedure: INSERTION OF DIALYSIS CATHETER USING 23CM DOUBLE LUMEN CATHETER;  Surgeon: [name], MD;  Location: [place];  Service: Vascular;  Laterality: Right;   IR FLUORO GUIDE CV LINE RIGHT  05/20/2020   IR US GUIDE VASC ACCESS RIGHT  05/20/2020   KNEE ARTHROPLASTY Left 08/08/2017   Procedure: LEFT TOTAL KNEE ARTHROPLASTY WITH COMPUTER NAVIGATION;  Surgeon: Rod Can, MD;  Location: [place];  Service: Orthopedics;  Laterality: Left;  Needs RNFA   KNEE ARTHROPLASTY Right 11/17/2017   Procedure: RIGHT TOTAL KNEE ARTHROPLASTY WITH COMPUTER NAVIGATION;  Surgeon: Rod Can, MD;  [facility];  Service: Orthopedics;  Laterality: Right;  NEEDS RNFA   ROBOT ASSISTED LAPAROSCOPIC NEPHRECTOMY Right 05/16/2020   Procedure: XI ROBOTIC ASSISTED LAPAROSCOPIC NEPHRECTOMY;  Surgeon: Cleon Gustin, MD;  Location[facility];  Service: Urology;  Laterality: Right;  2.5 hrs     Allergies  Allergen Reactions   No Known Allergies       Family History  Problem Relation Age of Onset   Hypertension Mother    Kidney disease Mother    Heart disease Father    Kidney disease Other      Social History Todd Robertson reports that he quit smoking about 36 years ago. His smoking use included cigarettes. He started smoking about 55 years ago. He has a 10.00 pack-year smoking history. He has never used smokeless  tobacco. Todd Robertson reports no history of alcohol use.   Review of Systems CONSTITUTIONAL: No weight loss, fever, chills, weakness or fatigue.  HEENT: Eyes: No visual loss, blurred vision, double vision or yellow sclerae.No hearing loss, sneezing, congestion, runny nose or sore throat.  SKIN: No rash or itching.  CARDIOVASCULAR: per hpi RESPIRATORY: No shortness of breath, cough or sputum.  GASTROINTESTINAL: No anorexia, nausea, vomiting or diarrhea. No abdominal pain or blood.  GENITOURINARY: No burning  on urination, no polyuria NEUROLOGICAL: No headache, dizziness, syncope, paralysis, ataxia, numbness or tingling in the extremities. No change in bowel or bladder control.  MUSCULOSKELETAL: No muscle, back pain, joint pain or stiffness.  LYMPHATICS: No enlarged nodes. No history of splenectomy.  PSYCHIATRIC: No history of depression or anxiety.  ENDOCRINOLOGIC: No reports of sweating, cold or heat intolerance. No polyuria or polydipsia.  Marland Kitchen   Physical Examination Vitals:   09/14/21 1441  BP: (!) 100/50  Pulse: 72  SpO2: 95%   Filed Weights   09/14/21 1441  Weight: 225 lb (102.1 kg)    Gen: resting comfortably, no acute distress HEENT: no scleral icterus, pupils equal round and reactive, no palptable cervical adenopathy,  CV: RRR, no m/r/g, no jvd Resp: Clear to auscultation bilaterally GI: abdomen is soft, non-tender, non-distended, normal bowel sounds, no hepatosplenomegaly MSK: extremities are warm, no edema.  Skin: warm, no rash Neuro:  no focal deficits Psych: appropriate affect   Diagnostic Studies Echocardiogram 05/28/2020. 1. Left ventricular ejection fraction, by estimation, is 45 to 50%. The left ventricle has mildly decreased function. The left ventricle demonstrates global hypokinesis. There is mild left ventricular hypertrophy. Left ventricular diastolic parameters are consistent with Grade II diastolic dysfunction (pseudonormalization). 2. Right  ventricular systolic function is normal. The right ventricular size is mildly enlarged. Tricuspid regurgitation signal is inadequate for assessing PA pressure. 3. Right atrial size was mildly dilated. 4. The mitral valve is normal in structure. No evidence of mitral valve regurgitation. No evidence of mitral stenosis. 5. The aortic valve is tricuspid. Aortic valve regurgitation is not visualized. Mild aortic valve sclerosis is present, with no evidence of aortic valve stenosis. 6. Aortic dilatation noted. 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 7. Technically difficult study with poor images.     Lower venous Doppler study 05/29/2020 RIGHT - There is no evidence of deep vein thrombosis in the lower extremity. - No cystic structure found in the popliteal fossa. LEFT: - There is no evidence of deep vein thrombosis in the lower extremity. - No cystic structure found in the popliteal fossa.    Assessment and Plan  1. Chronic systolic HF/NICM - most recent echo shows LVEF has normalized - fluid management per HD - medical therapy limited by low bp's, actually requiring midodrine - continue to monitor at this time.    2. Post op Afib - appears isolated during admission in postop setting complicated by sepsis, severe anemia. - no evidence of recurrence, continue to monitor   3. PVCs -no recent symptoms, continue lopressor.      Arnoldo Lenis, M.D.

## 2021-09-15 ENCOUNTER — Encounter: Payer: Self-pay | Admitting: *Deleted

## 2021-09-16 DIAGNOSIS — Z992 Dependence on renal dialysis: Secondary | ICD-10-CM | POA: Diagnosis not present

## 2021-09-16 DIAGNOSIS — N186 End stage renal disease: Secondary | ICD-10-CM | POA: Diagnosis not present

## 2021-09-16 DIAGNOSIS — D631 Anemia in chronic kidney disease: Secondary | ICD-10-CM | POA: Diagnosis not present

## 2021-09-16 DIAGNOSIS — D509 Iron deficiency anemia, unspecified: Secondary | ICD-10-CM | POA: Diagnosis not present

## 2021-09-16 DIAGNOSIS — N2581 Secondary hyperparathyroidism of renal origin: Secondary | ICD-10-CM | POA: Diagnosis not present

## 2021-09-18 DIAGNOSIS — N2581 Secondary hyperparathyroidism of renal origin: Secondary | ICD-10-CM | POA: Diagnosis not present

## 2021-09-18 DIAGNOSIS — D631 Anemia in chronic kidney disease: Secondary | ICD-10-CM | POA: Diagnosis not present

## 2021-09-18 DIAGNOSIS — Z992 Dependence on renal dialysis: Secondary | ICD-10-CM | POA: Diagnosis not present

## 2021-09-18 DIAGNOSIS — D509 Iron deficiency anemia, unspecified: Secondary | ICD-10-CM | POA: Diagnosis not present

## 2021-09-18 DIAGNOSIS — N186 End stage renal disease: Secondary | ICD-10-CM | POA: Diagnosis not present

## 2021-09-21 DIAGNOSIS — Z992 Dependence on renal dialysis: Secondary | ICD-10-CM | POA: Diagnosis not present

## 2021-09-21 DIAGNOSIS — N186 End stage renal disease: Secondary | ICD-10-CM | POA: Diagnosis not present

## 2021-09-21 DIAGNOSIS — D631 Anemia in chronic kidney disease: Secondary | ICD-10-CM | POA: Diagnosis not present

## 2021-09-21 DIAGNOSIS — D509 Iron deficiency anemia, unspecified: Secondary | ICD-10-CM | POA: Diagnosis not present

## 2021-09-21 DIAGNOSIS — N2581 Secondary hyperparathyroidism of renal origin: Secondary | ICD-10-CM | POA: Diagnosis not present

## 2021-09-22 DIAGNOSIS — E1165 Type 2 diabetes mellitus with hyperglycemia: Secondary | ICD-10-CM | POA: Diagnosis not present

## 2021-09-22 DIAGNOSIS — M25511 Pain in right shoulder: Secondary | ICD-10-CM | POA: Diagnosis not present

## 2021-09-22 DIAGNOSIS — I10 Essential (primary) hypertension: Secondary | ICD-10-CM | POA: Diagnosis not present

## 2021-09-22 DIAGNOSIS — Z87891 Personal history of nicotine dependence: Secondary | ICD-10-CM | POA: Diagnosis not present

## 2021-09-22 DIAGNOSIS — Z299 Encounter for prophylactic measures, unspecified: Secondary | ICD-10-CM | POA: Diagnosis not present

## 2021-09-22 DIAGNOSIS — M25512 Pain in left shoulder: Secondary | ICD-10-CM | POA: Diagnosis not present

## 2021-09-23 DIAGNOSIS — D631 Anemia in chronic kidney disease: Secondary | ICD-10-CM | POA: Diagnosis not present

## 2021-09-23 DIAGNOSIS — Z992 Dependence on renal dialysis: Secondary | ICD-10-CM | POA: Diagnosis not present

## 2021-09-23 DIAGNOSIS — N186 End stage renal disease: Secondary | ICD-10-CM | POA: Diagnosis not present

## 2021-09-23 DIAGNOSIS — N2581 Secondary hyperparathyroidism of renal origin: Secondary | ICD-10-CM | POA: Diagnosis not present

## 2021-09-23 DIAGNOSIS — D509 Iron deficiency anemia, unspecified: Secondary | ICD-10-CM | POA: Diagnosis not present

## 2021-09-25 DIAGNOSIS — D509 Iron deficiency anemia, unspecified: Secondary | ICD-10-CM | POA: Diagnosis not present

## 2021-09-25 DIAGNOSIS — N186 End stage renal disease: Secondary | ICD-10-CM | POA: Diagnosis not present

## 2021-09-25 DIAGNOSIS — Z992 Dependence on renal dialysis: Secondary | ICD-10-CM | POA: Diagnosis not present

## 2021-09-25 DIAGNOSIS — N2581 Secondary hyperparathyroidism of renal origin: Secondary | ICD-10-CM | POA: Diagnosis not present

## 2021-09-25 DIAGNOSIS — D631 Anemia in chronic kidney disease: Secondary | ICD-10-CM | POA: Diagnosis not present

## 2021-09-28 DIAGNOSIS — D631 Anemia in chronic kidney disease: Secondary | ICD-10-CM | POA: Diagnosis not present

## 2021-09-28 DIAGNOSIS — Z992 Dependence on renal dialysis: Secondary | ICD-10-CM | POA: Diagnosis not present

## 2021-09-28 DIAGNOSIS — N2581 Secondary hyperparathyroidism of renal origin: Secondary | ICD-10-CM | POA: Diagnosis not present

## 2021-09-28 DIAGNOSIS — N186 End stage renal disease: Secondary | ICD-10-CM | POA: Diagnosis not present

## 2021-09-28 DIAGNOSIS — D509 Iron deficiency anemia, unspecified: Secondary | ICD-10-CM | POA: Diagnosis not present

## 2021-09-30 DIAGNOSIS — D631 Anemia in chronic kidney disease: Secondary | ICD-10-CM | POA: Diagnosis not present

## 2021-09-30 DIAGNOSIS — N2581 Secondary hyperparathyroidism of renal origin: Secondary | ICD-10-CM | POA: Diagnosis not present

## 2021-09-30 DIAGNOSIS — D509 Iron deficiency anemia, unspecified: Secondary | ICD-10-CM | POA: Diagnosis not present

## 2021-09-30 DIAGNOSIS — Z992 Dependence on renal dialysis: Secondary | ICD-10-CM | POA: Diagnosis not present

## 2021-09-30 DIAGNOSIS — N186 End stage renal disease: Secondary | ICD-10-CM | POA: Diagnosis not present

## 2021-10-02 DIAGNOSIS — D509 Iron deficiency anemia, unspecified: Secondary | ICD-10-CM | POA: Diagnosis not present

## 2021-10-02 DIAGNOSIS — Z992 Dependence on renal dialysis: Secondary | ICD-10-CM | POA: Diagnosis not present

## 2021-10-02 DIAGNOSIS — N186 End stage renal disease: Secondary | ICD-10-CM | POA: Diagnosis not present

## 2021-10-02 DIAGNOSIS — N2581 Secondary hyperparathyroidism of renal origin: Secondary | ICD-10-CM | POA: Diagnosis not present

## 2021-10-02 DIAGNOSIS — D631 Anemia in chronic kidney disease: Secondary | ICD-10-CM | POA: Diagnosis not present

## 2021-10-05 DIAGNOSIS — N186 End stage renal disease: Secondary | ICD-10-CM | POA: Diagnosis not present

## 2021-10-05 DIAGNOSIS — D509 Iron deficiency anemia, unspecified: Secondary | ICD-10-CM | POA: Diagnosis not present

## 2021-10-05 DIAGNOSIS — N2581 Secondary hyperparathyroidism of renal origin: Secondary | ICD-10-CM | POA: Diagnosis not present

## 2021-10-05 DIAGNOSIS — D631 Anemia in chronic kidney disease: Secondary | ICD-10-CM | POA: Diagnosis not present

## 2021-10-05 DIAGNOSIS — Z992 Dependence on renal dialysis: Secondary | ICD-10-CM | POA: Diagnosis not present

## 2021-10-07 DIAGNOSIS — Z992 Dependence on renal dialysis: Secondary | ICD-10-CM | POA: Diagnosis not present

## 2021-10-07 DIAGNOSIS — N2581 Secondary hyperparathyroidism of renal origin: Secondary | ICD-10-CM | POA: Diagnosis not present

## 2021-10-07 DIAGNOSIS — E7800 Pure hypercholesterolemia, unspecified: Secondary | ICD-10-CM | POA: Diagnosis not present

## 2021-10-07 DIAGNOSIS — N186 End stage renal disease: Secondary | ICD-10-CM | POA: Diagnosis not present

## 2021-10-07 DIAGNOSIS — D631 Anemia in chronic kidney disease: Secondary | ICD-10-CM | POA: Diagnosis not present

## 2021-10-07 DIAGNOSIS — D509 Iron deficiency anemia, unspecified: Secondary | ICD-10-CM | POA: Diagnosis not present

## 2021-10-07 DIAGNOSIS — I10 Essential (primary) hypertension: Secondary | ICD-10-CM | POA: Diagnosis not present

## 2021-10-09 DIAGNOSIS — N2581 Secondary hyperparathyroidism of renal origin: Secondary | ICD-10-CM | POA: Diagnosis not present

## 2021-10-09 DIAGNOSIS — D509 Iron deficiency anemia, unspecified: Secondary | ICD-10-CM | POA: Diagnosis not present

## 2021-10-09 DIAGNOSIS — Z992 Dependence on renal dialysis: Secondary | ICD-10-CM | POA: Diagnosis not present

## 2021-10-09 DIAGNOSIS — D631 Anemia in chronic kidney disease: Secondary | ICD-10-CM | POA: Diagnosis not present

## 2021-10-09 DIAGNOSIS — Z23 Encounter for immunization: Secondary | ICD-10-CM | POA: Diagnosis not present

## 2021-10-09 DIAGNOSIS — N186 End stage renal disease: Secondary | ICD-10-CM | POA: Diagnosis not present

## 2021-10-12 DIAGNOSIS — Z992 Dependence on renal dialysis: Secondary | ICD-10-CM | POA: Diagnosis not present

## 2021-10-12 DIAGNOSIS — D509 Iron deficiency anemia, unspecified: Secondary | ICD-10-CM | POA: Diagnosis not present

## 2021-10-12 DIAGNOSIS — Z23 Encounter for immunization: Secondary | ICD-10-CM | POA: Diagnosis not present

## 2021-10-12 DIAGNOSIS — N2581 Secondary hyperparathyroidism of renal origin: Secondary | ICD-10-CM | POA: Diagnosis not present

## 2021-10-12 DIAGNOSIS — N186 End stage renal disease: Secondary | ICD-10-CM | POA: Diagnosis not present

## 2021-10-12 DIAGNOSIS — D631 Anemia in chronic kidney disease: Secondary | ICD-10-CM | POA: Diagnosis not present

## 2021-10-14 DIAGNOSIS — Z23 Encounter for immunization: Secondary | ICD-10-CM | POA: Diagnosis not present

## 2021-10-14 DIAGNOSIS — N186 End stage renal disease: Secondary | ICD-10-CM | POA: Diagnosis not present

## 2021-10-14 DIAGNOSIS — D509 Iron deficiency anemia, unspecified: Secondary | ICD-10-CM | POA: Diagnosis not present

## 2021-10-14 DIAGNOSIS — Z992 Dependence on renal dialysis: Secondary | ICD-10-CM | POA: Diagnosis not present

## 2021-10-14 DIAGNOSIS — D631 Anemia in chronic kidney disease: Secondary | ICD-10-CM | POA: Diagnosis not present

## 2021-10-14 DIAGNOSIS — N2581 Secondary hyperparathyroidism of renal origin: Secondary | ICD-10-CM | POA: Diagnosis not present

## 2021-10-16 DIAGNOSIS — Z992 Dependence on renal dialysis: Secondary | ICD-10-CM | POA: Diagnosis not present

## 2021-10-16 DIAGNOSIS — Z23 Encounter for immunization: Secondary | ICD-10-CM | POA: Diagnosis not present

## 2021-10-16 DIAGNOSIS — N2581 Secondary hyperparathyroidism of renal origin: Secondary | ICD-10-CM | POA: Diagnosis not present

## 2021-10-16 DIAGNOSIS — D509 Iron deficiency anemia, unspecified: Secondary | ICD-10-CM | POA: Diagnosis not present

## 2021-10-16 DIAGNOSIS — D631 Anemia in chronic kidney disease: Secondary | ICD-10-CM | POA: Diagnosis not present

## 2021-10-16 DIAGNOSIS — N186 End stage renal disease: Secondary | ICD-10-CM | POA: Diagnosis not present

## 2021-10-19 ENCOUNTER — Other Ambulatory Visit: Payer: Self-pay | Admitting: Cardiology

## 2021-10-19 DIAGNOSIS — D631 Anemia in chronic kidney disease: Secondary | ICD-10-CM | POA: Diagnosis not present

## 2021-10-19 DIAGNOSIS — N2581 Secondary hyperparathyroidism of renal origin: Secondary | ICD-10-CM | POA: Diagnosis not present

## 2021-10-19 DIAGNOSIS — N186 End stage renal disease: Secondary | ICD-10-CM | POA: Diagnosis not present

## 2021-10-19 DIAGNOSIS — D509 Iron deficiency anemia, unspecified: Secondary | ICD-10-CM | POA: Diagnosis not present

## 2021-10-19 DIAGNOSIS — Z992 Dependence on renal dialysis: Secondary | ICD-10-CM | POA: Diagnosis not present

## 2021-10-19 DIAGNOSIS — Z23 Encounter for immunization: Secondary | ICD-10-CM | POA: Diagnosis not present

## 2021-10-20 DIAGNOSIS — Z0000 Encounter for general adult medical examination without abnormal findings: Secondary | ICD-10-CM | POA: Diagnosis not present

## 2021-10-20 DIAGNOSIS — E7800 Pure hypercholesterolemia, unspecified: Secondary | ICD-10-CM | POA: Diagnosis not present

## 2021-10-20 DIAGNOSIS — Z125 Encounter for screening for malignant neoplasm of prostate: Secondary | ICD-10-CM | POA: Diagnosis not present

## 2021-10-20 DIAGNOSIS — Z6828 Body mass index (BMI) 28.0-28.9, adult: Secondary | ICD-10-CM | POA: Diagnosis not present

## 2021-10-20 DIAGNOSIS — I10 Essential (primary) hypertension: Secondary | ICD-10-CM | POA: Diagnosis not present

## 2021-10-20 DIAGNOSIS — Z79899 Other long term (current) drug therapy: Secondary | ICD-10-CM | POA: Diagnosis not present

## 2021-10-20 DIAGNOSIS — E1165 Type 2 diabetes mellitus with hyperglycemia: Secondary | ICD-10-CM | POA: Diagnosis not present

## 2021-10-20 DIAGNOSIS — Z7189 Other specified counseling: Secondary | ICD-10-CM | POA: Diagnosis not present

## 2021-10-20 DIAGNOSIS — R5383 Other fatigue: Secondary | ICD-10-CM | POA: Diagnosis not present

## 2021-10-20 DIAGNOSIS — Z299 Encounter for prophylactic measures, unspecified: Secondary | ICD-10-CM | POA: Diagnosis not present

## 2021-10-20 DIAGNOSIS — Z1211 Encounter for screening for malignant neoplasm of colon: Secondary | ICD-10-CM | POA: Diagnosis not present

## 2021-10-20 DIAGNOSIS — Z1339 Encounter for screening examination for other mental health and behavioral disorders: Secondary | ICD-10-CM | POA: Diagnosis not present

## 2021-10-20 DIAGNOSIS — Z1331 Encounter for screening for depression: Secondary | ICD-10-CM | POA: Diagnosis not present

## 2021-10-21 DIAGNOSIS — Z23 Encounter for immunization: Secondary | ICD-10-CM | POA: Diagnosis not present

## 2021-10-21 DIAGNOSIS — N2581 Secondary hyperparathyroidism of renal origin: Secondary | ICD-10-CM | POA: Diagnosis not present

## 2021-10-21 DIAGNOSIS — D631 Anemia in chronic kidney disease: Secondary | ICD-10-CM | POA: Diagnosis not present

## 2021-10-21 DIAGNOSIS — D509 Iron deficiency anemia, unspecified: Secondary | ICD-10-CM | POA: Diagnosis not present

## 2021-10-21 DIAGNOSIS — N186 End stage renal disease: Secondary | ICD-10-CM | POA: Diagnosis not present

## 2021-10-21 DIAGNOSIS — Z992 Dependence on renal dialysis: Secondary | ICD-10-CM | POA: Diagnosis not present

## 2021-10-23 DIAGNOSIS — Z992 Dependence on renal dialysis: Secondary | ICD-10-CM | POA: Diagnosis not present

## 2021-10-23 DIAGNOSIS — N186 End stage renal disease: Secondary | ICD-10-CM | POA: Diagnosis not present

## 2021-10-23 DIAGNOSIS — D509 Iron deficiency anemia, unspecified: Secondary | ICD-10-CM | POA: Diagnosis not present

## 2021-10-23 DIAGNOSIS — D631 Anemia in chronic kidney disease: Secondary | ICD-10-CM | POA: Diagnosis not present

## 2021-10-23 DIAGNOSIS — N2581 Secondary hyperparathyroidism of renal origin: Secondary | ICD-10-CM | POA: Diagnosis not present

## 2021-10-23 DIAGNOSIS — Z23 Encounter for immunization: Secondary | ICD-10-CM | POA: Diagnosis not present

## 2021-10-26 DIAGNOSIS — D631 Anemia in chronic kidney disease: Secondary | ICD-10-CM | POA: Diagnosis not present

## 2021-10-26 DIAGNOSIS — Z992 Dependence on renal dialysis: Secondary | ICD-10-CM | POA: Diagnosis not present

## 2021-10-26 DIAGNOSIS — N2581 Secondary hyperparathyroidism of renal origin: Secondary | ICD-10-CM | POA: Diagnosis not present

## 2021-10-26 DIAGNOSIS — D509 Iron deficiency anemia, unspecified: Secondary | ICD-10-CM | POA: Diagnosis not present

## 2021-10-26 DIAGNOSIS — N186 End stage renal disease: Secondary | ICD-10-CM | POA: Diagnosis not present

## 2021-10-26 DIAGNOSIS — Z23 Encounter for immunization: Secondary | ICD-10-CM | POA: Diagnosis not present

## 2021-10-28 DIAGNOSIS — Z23 Encounter for immunization: Secondary | ICD-10-CM | POA: Diagnosis not present

## 2021-10-28 DIAGNOSIS — D631 Anemia in chronic kidney disease: Secondary | ICD-10-CM | POA: Diagnosis not present

## 2021-10-28 DIAGNOSIS — Z992 Dependence on renal dialysis: Secondary | ICD-10-CM | POA: Diagnosis not present

## 2021-10-28 DIAGNOSIS — N186 End stage renal disease: Secondary | ICD-10-CM | POA: Diagnosis not present

## 2021-10-28 DIAGNOSIS — N2581 Secondary hyperparathyroidism of renal origin: Secondary | ICD-10-CM | POA: Diagnosis not present

## 2021-10-28 DIAGNOSIS — I259 Chronic ischemic heart disease, unspecified: Secondary | ICD-10-CM | POA: Diagnosis not present

## 2021-10-28 DIAGNOSIS — D509 Iron deficiency anemia, unspecified: Secondary | ICD-10-CM | POA: Diagnosis not present

## 2021-10-30 DIAGNOSIS — Z992 Dependence on renal dialysis: Secondary | ICD-10-CM | POA: Diagnosis not present

## 2021-10-30 DIAGNOSIS — Z23 Encounter for immunization: Secondary | ICD-10-CM | POA: Diagnosis not present

## 2021-10-30 DIAGNOSIS — N2581 Secondary hyperparathyroidism of renal origin: Secondary | ICD-10-CM | POA: Diagnosis not present

## 2021-10-30 DIAGNOSIS — D509 Iron deficiency anemia, unspecified: Secondary | ICD-10-CM | POA: Diagnosis not present

## 2021-10-30 DIAGNOSIS — N186 End stage renal disease: Secondary | ICD-10-CM | POA: Diagnosis not present

## 2021-10-30 DIAGNOSIS — D631 Anemia in chronic kidney disease: Secondary | ICD-10-CM | POA: Diagnosis not present

## 2021-11-02 DIAGNOSIS — D631 Anemia in chronic kidney disease: Secondary | ICD-10-CM | POA: Diagnosis not present

## 2021-11-02 DIAGNOSIS — N186 End stage renal disease: Secondary | ICD-10-CM | POA: Diagnosis not present

## 2021-11-02 DIAGNOSIS — Z23 Encounter for immunization: Secondary | ICD-10-CM | POA: Diagnosis not present

## 2021-11-02 DIAGNOSIS — Z992 Dependence on renal dialysis: Secondary | ICD-10-CM | POA: Diagnosis not present

## 2021-11-02 DIAGNOSIS — D509 Iron deficiency anemia, unspecified: Secondary | ICD-10-CM | POA: Diagnosis not present

## 2021-11-02 DIAGNOSIS — N2581 Secondary hyperparathyroidism of renal origin: Secondary | ICD-10-CM | POA: Diagnosis not present

## 2021-11-04 DIAGNOSIS — Z992 Dependence on renal dialysis: Secondary | ICD-10-CM | POA: Diagnosis not present

## 2021-11-04 DIAGNOSIS — Z23 Encounter for immunization: Secondary | ICD-10-CM | POA: Diagnosis not present

## 2021-11-04 DIAGNOSIS — I871 Compression of vein: Secondary | ICD-10-CM | POA: Diagnosis not present

## 2021-11-04 DIAGNOSIS — D509 Iron deficiency anemia, unspecified: Secondary | ICD-10-CM | POA: Diagnosis not present

## 2021-11-04 DIAGNOSIS — T82868A Thrombosis of vascular prosthetic devices, implants and grafts, initial encounter: Secondary | ICD-10-CM | POA: Diagnosis not present

## 2021-11-04 DIAGNOSIS — N186 End stage renal disease: Secondary | ICD-10-CM | POA: Diagnosis not present

## 2021-11-04 DIAGNOSIS — N2581 Secondary hyperparathyroidism of renal origin: Secondary | ICD-10-CM | POA: Diagnosis not present

## 2021-11-04 DIAGNOSIS — D631 Anemia in chronic kidney disease: Secondary | ICD-10-CM | POA: Diagnosis not present

## 2021-11-06 DIAGNOSIS — D631 Anemia in chronic kidney disease: Secondary | ICD-10-CM | POA: Diagnosis not present

## 2021-11-06 DIAGNOSIS — D509 Iron deficiency anemia, unspecified: Secondary | ICD-10-CM | POA: Diagnosis not present

## 2021-11-06 DIAGNOSIS — N186 End stage renal disease: Secondary | ICD-10-CM | POA: Diagnosis not present

## 2021-11-06 DIAGNOSIS — N2581 Secondary hyperparathyroidism of renal origin: Secondary | ICD-10-CM | POA: Diagnosis not present

## 2021-11-06 DIAGNOSIS — Z992 Dependence on renal dialysis: Secondary | ICD-10-CM | POA: Diagnosis not present

## 2021-11-06 DIAGNOSIS — Z23 Encounter for immunization: Secondary | ICD-10-CM | POA: Diagnosis not present

## 2021-11-07 DIAGNOSIS — N186 End stage renal disease: Secondary | ICD-10-CM | POA: Diagnosis not present

## 2021-11-07 DIAGNOSIS — Z992 Dependence on renal dialysis: Secondary | ICD-10-CM | POA: Diagnosis not present

## 2021-11-09 DIAGNOSIS — N186 End stage renal disease: Secondary | ICD-10-CM | POA: Diagnosis not present

## 2021-11-09 DIAGNOSIS — Z992 Dependence on renal dialysis: Secondary | ICD-10-CM | POA: Diagnosis not present

## 2021-11-09 DIAGNOSIS — N2581 Secondary hyperparathyroidism of renal origin: Secondary | ICD-10-CM | POA: Diagnosis not present

## 2021-11-09 DIAGNOSIS — D631 Anemia in chronic kidney disease: Secondary | ICD-10-CM | POA: Diagnosis not present

## 2021-11-09 DIAGNOSIS — D509 Iron deficiency anemia, unspecified: Secondary | ICD-10-CM | POA: Diagnosis not present

## 2021-11-11 DIAGNOSIS — N186 End stage renal disease: Secondary | ICD-10-CM | POA: Diagnosis not present

## 2021-11-11 DIAGNOSIS — Z992 Dependence on renal dialysis: Secondary | ICD-10-CM | POA: Diagnosis not present

## 2021-11-11 DIAGNOSIS — D631 Anemia in chronic kidney disease: Secondary | ICD-10-CM | POA: Diagnosis not present

## 2021-11-11 DIAGNOSIS — N2581 Secondary hyperparathyroidism of renal origin: Secondary | ICD-10-CM | POA: Diagnosis not present

## 2021-11-11 DIAGNOSIS — D509 Iron deficiency anemia, unspecified: Secondary | ICD-10-CM | POA: Diagnosis not present

## 2021-11-13 DIAGNOSIS — D631 Anemia in chronic kidney disease: Secondary | ICD-10-CM | POA: Diagnosis not present

## 2021-11-13 DIAGNOSIS — N2581 Secondary hyperparathyroidism of renal origin: Secondary | ICD-10-CM | POA: Diagnosis not present

## 2021-11-13 DIAGNOSIS — D509 Iron deficiency anemia, unspecified: Secondary | ICD-10-CM | POA: Diagnosis not present

## 2021-11-13 DIAGNOSIS — N186 End stage renal disease: Secondary | ICD-10-CM | POA: Diagnosis not present

## 2021-11-13 DIAGNOSIS — Z992 Dependence on renal dialysis: Secondary | ICD-10-CM | POA: Diagnosis not present

## 2021-11-16 DIAGNOSIS — D509 Iron deficiency anemia, unspecified: Secondary | ICD-10-CM | POA: Diagnosis not present

## 2021-11-16 DIAGNOSIS — N186 End stage renal disease: Secondary | ICD-10-CM | POA: Diagnosis not present

## 2021-11-16 DIAGNOSIS — Z992 Dependence on renal dialysis: Secondary | ICD-10-CM | POA: Diagnosis not present

## 2021-11-16 DIAGNOSIS — N2581 Secondary hyperparathyroidism of renal origin: Secondary | ICD-10-CM | POA: Diagnosis not present

## 2021-11-16 DIAGNOSIS — D631 Anemia in chronic kidney disease: Secondary | ICD-10-CM | POA: Diagnosis not present

## 2021-11-18 DIAGNOSIS — N186 End stage renal disease: Secondary | ICD-10-CM | POA: Diagnosis not present

## 2021-11-18 DIAGNOSIS — D509 Iron deficiency anemia, unspecified: Secondary | ICD-10-CM | POA: Diagnosis not present

## 2021-11-18 DIAGNOSIS — N2581 Secondary hyperparathyroidism of renal origin: Secondary | ICD-10-CM | POA: Diagnosis not present

## 2021-11-18 DIAGNOSIS — Z992 Dependence on renal dialysis: Secondary | ICD-10-CM | POA: Diagnosis not present

## 2021-11-18 DIAGNOSIS — D631 Anemia in chronic kidney disease: Secondary | ICD-10-CM | POA: Diagnosis not present

## 2021-11-20 DIAGNOSIS — Z992 Dependence on renal dialysis: Secondary | ICD-10-CM | POA: Diagnosis not present

## 2021-11-20 DIAGNOSIS — N186 End stage renal disease: Secondary | ICD-10-CM | POA: Diagnosis not present

## 2021-11-20 DIAGNOSIS — D509 Iron deficiency anemia, unspecified: Secondary | ICD-10-CM | POA: Diagnosis not present

## 2021-11-20 DIAGNOSIS — N2581 Secondary hyperparathyroidism of renal origin: Secondary | ICD-10-CM | POA: Diagnosis not present

## 2021-11-20 DIAGNOSIS — D631 Anemia in chronic kidney disease: Secondary | ICD-10-CM | POA: Diagnosis not present

## 2021-11-23 DIAGNOSIS — Z992 Dependence on renal dialysis: Secondary | ICD-10-CM | POA: Diagnosis not present

## 2021-11-23 DIAGNOSIS — D631 Anemia in chronic kidney disease: Secondary | ICD-10-CM | POA: Diagnosis not present

## 2021-11-23 DIAGNOSIS — E1151 Type 2 diabetes mellitus with diabetic peripheral angiopathy without gangrene: Secondary | ICD-10-CM | POA: Diagnosis not present

## 2021-11-23 DIAGNOSIS — D509 Iron deficiency anemia, unspecified: Secondary | ICD-10-CM | POA: Diagnosis not present

## 2021-11-23 DIAGNOSIS — B351 Tinea unguium: Secondary | ICD-10-CM | POA: Diagnosis not present

## 2021-11-23 DIAGNOSIS — L110 Acquired keratosis follicularis: Secondary | ICD-10-CM | POA: Diagnosis not present

## 2021-11-23 DIAGNOSIS — E1140 Type 2 diabetes mellitus with diabetic neuropathy, unspecified: Secondary | ICD-10-CM | POA: Diagnosis not present

## 2021-11-23 DIAGNOSIS — N2581 Secondary hyperparathyroidism of renal origin: Secondary | ICD-10-CM | POA: Diagnosis not present

## 2021-11-23 DIAGNOSIS — N186 End stage renal disease: Secondary | ICD-10-CM | POA: Diagnosis not present

## 2021-11-25 DIAGNOSIS — N186 End stage renal disease: Secondary | ICD-10-CM | POA: Diagnosis not present

## 2021-11-25 DIAGNOSIS — D509 Iron deficiency anemia, unspecified: Secondary | ICD-10-CM | POA: Diagnosis not present

## 2021-11-25 DIAGNOSIS — N2581 Secondary hyperparathyroidism of renal origin: Secondary | ICD-10-CM | POA: Diagnosis not present

## 2021-11-25 DIAGNOSIS — D631 Anemia in chronic kidney disease: Secondary | ICD-10-CM | POA: Diagnosis not present

## 2021-11-25 DIAGNOSIS — Z992 Dependence on renal dialysis: Secondary | ICD-10-CM | POA: Diagnosis not present

## 2021-11-27 DIAGNOSIS — N186 End stage renal disease: Secondary | ICD-10-CM | POA: Diagnosis not present

## 2021-11-27 DIAGNOSIS — N2581 Secondary hyperparathyroidism of renal origin: Secondary | ICD-10-CM | POA: Diagnosis not present

## 2021-11-27 DIAGNOSIS — Z992 Dependence on renal dialysis: Secondary | ICD-10-CM | POA: Diagnosis not present

## 2021-11-27 DIAGNOSIS — D509 Iron deficiency anemia, unspecified: Secondary | ICD-10-CM | POA: Diagnosis not present

## 2021-11-27 DIAGNOSIS — D631 Anemia in chronic kidney disease: Secondary | ICD-10-CM | POA: Diagnosis not present

## 2021-11-30 DIAGNOSIS — N186 End stage renal disease: Secondary | ICD-10-CM | POA: Diagnosis not present

## 2021-11-30 DIAGNOSIS — D509 Iron deficiency anemia, unspecified: Secondary | ICD-10-CM | POA: Diagnosis not present

## 2021-11-30 DIAGNOSIS — Z992 Dependence on renal dialysis: Secondary | ICD-10-CM | POA: Diagnosis not present

## 2021-11-30 DIAGNOSIS — D631 Anemia in chronic kidney disease: Secondary | ICD-10-CM | POA: Diagnosis not present

## 2021-11-30 DIAGNOSIS — N2581 Secondary hyperparathyroidism of renal origin: Secondary | ICD-10-CM | POA: Diagnosis not present

## 2021-12-02 DIAGNOSIS — D631 Anemia in chronic kidney disease: Secondary | ICD-10-CM | POA: Diagnosis not present

## 2021-12-02 DIAGNOSIS — N186 End stage renal disease: Secondary | ICD-10-CM | POA: Diagnosis not present

## 2021-12-02 DIAGNOSIS — Z992 Dependence on renal dialysis: Secondary | ICD-10-CM | POA: Diagnosis not present

## 2021-12-02 DIAGNOSIS — D509 Iron deficiency anemia, unspecified: Secondary | ICD-10-CM | POA: Diagnosis not present

## 2021-12-02 DIAGNOSIS — N2581 Secondary hyperparathyroidism of renal origin: Secondary | ICD-10-CM | POA: Diagnosis not present

## 2021-12-04 DIAGNOSIS — D631 Anemia in chronic kidney disease: Secondary | ICD-10-CM | POA: Diagnosis not present

## 2021-12-04 DIAGNOSIS — N2581 Secondary hyperparathyroidism of renal origin: Secondary | ICD-10-CM | POA: Diagnosis not present

## 2021-12-04 DIAGNOSIS — Z992 Dependence on renal dialysis: Secondary | ICD-10-CM | POA: Diagnosis not present

## 2021-12-04 DIAGNOSIS — N186 End stage renal disease: Secondary | ICD-10-CM | POA: Diagnosis not present

## 2021-12-04 DIAGNOSIS — D509 Iron deficiency anemia, unspecified: Secondary | ICD-10-CM | POA: Diagnosis not present

## 2021-12-07 DIAGNOSIS — D509 Iron deficiency anemia, unspecified: Secondary | ICD-10-CM | POA: Diagnosis not present

## 2021-12-07 DIAGNOSIS — Z992 Dependence on renal dialysis: Secondary | ICD-10-CM | POA: Diagnosis not present

## 2021-12-07 DIAGNOSIS — D631 Anemia in chronic kidney disease: Secondary | ICD-10-CM | POA: Diagnosis not present

## 2021-12-07 DIAGNOSIS — N2581 Secondary hyperparathyroidism of renal origin: Secondary | ICD-10-CM | POA: Diagnosis not present

## 2021-12-07 DIAGNOSIS — N186 End stage renal disease: Secondary | ICD-10-CM | POA: Diagnosis not present

## 2021-12-08 DIAGNOSIS — N186 End stage renal disease: Secondary | ICD-10-CM | POA: Diagnosis not present

## 2021-12-08 DIAGNOSIS — Z992 Dependence on renal dialysis: Secondary | ICD-10-CM | POA: Diagnosis not present

## 2021-12-09 DIAGNOSIS — N2581 Secondary hyperparathyroidism of renal origin: Secondary | ICD-10-CM | POA: Diagnosis not present

## 2021-12-09 DIAGNOSIS — N186 End stage renal disease: Secondary | ICD-10-CM | POA: Diagnosis not present

## 2021-12-09 DIAGNOSIS — Z992 Dependence on renal dialysis: Secondary | ICD-10-CM | POA: Diagnosis not present

## 2021-12-09 DIAGNOSIS — D509 Iron deficiency anemia, unspecified: Secondary | ICD-10-CM | POA: Diagnosis not present

## 2021-12-09 DIAGNOSIS — D631 Anemia in chronic kidney disease: Secondary | ICD-10-CM | POA: Diagnosis not present

## 2021-12-10 ENCOUNTER — Other Ambulatory Visit: Payer: Medicare Other

## 2021-12-10 ENCOUNTER — Other Ambulatory Visit: Payer: Self-pay

## 2021-12-10 DIAGNOSIS — Z8546 Personal history of malignant neoplasm of prostate: Secondary | ICD-10-CM

## 2021-12-11 DIAGNOSIS — Z992 Dependence on renal dialysis: Secondary | ICD-10-CM | POA: Diagnosis not present

## 2021-12-11 DIAGNOSIS — N2581 Secondary hyperparathyroidism of renal origin: Secondary | ICD-10-CM | POA: Diagnosis not present

## 2021-12-11 DIAGNOSIS — N186 End stage renal disease: Secondary | ICD-10-CM | POA: Diagnosis not present

## 2021-12-11 DIAGNOSIS — D631 Anemia in chronic kidney disease: Secondary | ICD-10-CM | POA: Diagnosis not present

## 2021-12-11 DIAGNOSIS — D509 Iron deficiency anemia, unspecified: Secondary | ICD-10-CM | POA: Diagnosis not present

## 2021-12-11 LAB — PSA: Prostate Specific Ag, Serum: 0.8 ng/mL (ref 0.0–4.0)

## 2021-12-14 DIAGNOSIS — D631 Anemia in chronic kidney disease: Secondary | ICD-10-CM | POA: Diagnosis not present

## 2021-12-14 DIAGNOSIS — D509 Iron deficiency anemia, unspecified: Secondary | ICD-10-CM | POA: Diagnosis not present

## 2021-12-14 DIAGNOSIS — Z992 Dependence on renal dialysis: Secondary | ICD-10-CM | POA: Diagnosis not present

## 2021-12-14 DIAGNOSIS — N186 End stage renal disease: Secondary | ICD-10-CM | POA: Diagnosis not present

## 2021-12-14 DIAGNOSIS — N2581 Secondary hyperparathyroidism of renal origin: Secondary | ICD-10-CM | POA: Diagnosis not present

## 2021-12-16 DIAGNOSIS — Z992 Dependence on renal dialysis: Secondary | ICD-10-CM | POA: Diagnosis not present

## 2021-12-16 DIAGNOSIS — D509 Iron deficiency anemia, unspecified: Secondary | ICD-10-CM | POA: Diagnosis not present

## 2021-12-16 DIAGNOSIS — N186 End stage renal disease: Secondary | ICD-10-CM | POA: Diagnosis not present

## 2021-12-16 DIAGNOSIS — D631 Anemia in chronic kidney disease: Secondary | ICD-10-CM | POA: Diagnosis not present

## 2021-12-16 DIAGNOSIS — N2581 Secondary hyperparathyroidism of renal origin: Secondary | ICD-10-CM | POA: Diagnosis not present

## 2021-12-17 ENCOUNTER — Encounter: Payer: Self-pay | Admitting: Urology

## 2021-12-17 ENCOUNTER — Ambulatory Visit (INDEPENDENT_AMBULATORY_CARE_PROVIDER_SITE_OTHER): Payer: Medicare Other | Admitting: Urology

## 2021-12-17 ENCOUNTER — Other Ambulatory Visit: Payer: Self-pay

## 2021-12-17 VITALS — BP 121/64 | HR 83 | Wt 222.0 lb

## 2021-12-17 DIAGNOSIS — R300 Dysuria: Secondary | ICD-10-CM | POA: Diagnosis not present

## 2021-12-17 DIAGNOSIS — R3912 Poor urinary stream: Secondary | ICD-10-CM

## 2021-12-17 DIAGNOSIS — N138 Other obstructive and reflux uropathy: Secondary | ICD-10-CM

## 2021-12-17 DIAGNOSIS — Z20822 Contact with and (suspected) exposure to covid-19: Secondary | ICD-10-CM | POA: Diagnosis not present

## 2021-12-17 DIAGNOSIS — Z8546 Personal history of malignant neoplasm of prostate: Secondary | ICD-10-CM

## 2021-12-17 DIAGNOSIS — Z87440 Personal history of urinary (tract) infections: Secondary | ICD-10-CM

## 2021-12-17 DIAGNOSIS — N401 Enlarged prostate with lower urinary tract symptoms: Secondary | ICD-10-CM | POA: Diagnosis not present

## 2021-12-17 DIAGNOSIS — Z85528 Personal history of other malignant neoplasm of kidney: Secondary | ICD-10-CM

## 2021-12-17 LAB — URINALYSIS, ROUTINE W REFLEX MICROSCOPIC
Bilirubin, UA: NEGATIVE
Glucose, UA: NEGATIVE
Leukocytes,UA: NEGATIVE
Nitrite, UA: NEGATIVE
Specific Gravity, UA: 1.02 (ref 1.005–1.030)
Urobilinogen, Ur: 0.2 mg/dL (ref 0.2–1.0)
pH, UA: 5.5 (ref 5.0–7.5)

## 2021-12-17 LAB — MICROSCOPIC EXAMINATION
Bacteria, UA: NONE SEEN
Epithelial Cells (non renal): NONE SEEN /hpf (ref 0–10)
Renal Epithel, UA: NONE SEEN /hpf
WBC, UA: NONE SEEN /hpf (ref 0–5)

## 2021-12-17 MED ORDER — TAMSULOSIN HCL 0.4 MG PO CAPS: 0.4000 mg | ORAL_CAPSULE | Freq: Every day | ORAL | 3 refills | Status: DC

## 2021-12-17 NOTE — Progress Notes (Signed)
Subjective:  1. BPH with urinary obstruction   2. Weak urinary stream   3. Dysuria   4. History of prostate cancer   5. History of urinary infection   6. History of kidney cancer    12/17/21: Todd Robertson returns today in f/u.   He last saw Dr. Alyson Ingles on 08/26/21 for f/u of his history of a right nephrectomy for RCCa in 7/21 and imaging was unremarkable.  He is to have a CXR in a couple of months.  He has a history of prostate cancer treated with EXRT and his PSA is 0.8 which is a slight increase from 0.6 in 5/22.  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  He has a history of UTI's.  He last got Cipro in 10/22.  He had cystoscopy on 04/09/21.   HIs UA today is unremarkable.  He has some burning at times.  His IPSS is 17 with frequency and nocturia x 2.    06/18/21:  Todd Robertson returns today in f/u.  He is doing well with an IPSS of 3.  He has had no symptoms of a recurrent infection.   He didn't get a UA today.   He remains on tamsulosin but is off of of the Levaquin prescribed for post dialysis use on 04/15/21.    He had a right nephrectomy for RCCa in 7/21 and is to have an [place] and CXR next month.   04/09/21: Todd Robertson returns today in f/u for cystoscopy to evaluate his persistent voiding symptoms and cystitis.   His culture on 04/02/21 was negative.   His PSA is 0.6 which is down from the prior levels.  He was given gent 160mg  at his last visit and had some transient symptom improvement.     GU Hx: Todd Robertson returns today in f/u for his history of cystitis.  He has dysuria and difficulty voiding.  He has some urgency and frequency.  He has had no hematuria.   He has been treated with bactrim after a round of ceftin.  He has been off of the anitbiotics for about 2 weeks.  He has had no fever.  He had Klebsiella on 03/10/21 and it was sensitive to the bactrim which he was last given.   He has variable urine output.   He has increased frequency in the afternoon.  He will generally go 4-5x daily.   He has a  history of prostate cancer treated with radiation therapy 7-8 years ago.   He had a right radical nephrectomy in 7/21.   The urinary symptoms began after the procedure.   The last PSA's I find were 4.1 in 1/17 and 1.4 in 8/17.       ROS:  ROS:  A complete review of systems was performed.  All systems are negative except for pertinent findings as noted.   ROS  Allergies  Allergen Reactions   No Known Allergies     Outpatient Encounter Medications as of 12/17/2021  Medication Sig   acetaminophen (TYLENOL) 325 MG tablet Take 1-2 tablets (325-650 mg total) by mouth every 4 (four) hours as needed for mild pain.   albuterol (VENTOLIN HFA) 108 (90 Base) MCG/ACT inhaler Inhale 1-2 puffs into the lungs every 6 (six) hours as needed for wheezing or shortness of breath.   allopurinol (ZYLOPRIM) 300 MG tablet Take 300 mg by mouth daily.   atorvastatin (LIPITOR) 40 MG tablet Take 1 tablet (40 mg total) by mouth daily.   budesonide-formoterol (SYMBICORT)  160-4.5 MCG/ACT inhaler Inhale 2 puffs into the lungs 2 (two) times daily.   calcium acetate (PHOSLO) 667 MG capsule Take 3 capsules (2,001 mg total) by mouth 3 (three) times daily with meals.   cetirizine (ZYRTEC) 10 MG tablet Take by mouth.   Cholecalciferol (VITAMIN D) 50 MCG (2000 UT) tablet Take 1 tablet (2,000 Units total) by mouth daily.   econazole nitrate 1 % cream Apply topically 2 (two) times daily. (Patient not taking: Reported on 09/14/2021)   insulin lispro (HUMALOG) 100 UNIT/ML KwikPen Inject into the skin.   linaclotide (LINZESS) 290 MCG CAPS capsule Take by mouth. (Patient not taking: Reported on 09/14/2021)   loratadine (CLARITIN) 10 MG tablet Take 10 mg by mouth daily. (Patient not taking: Reported on 09/14/2021)   metoprolol tartrate (LOPRESSOR) 25 MG tablet Take 0.5 tablets (12.5 mg total) by mouth 2 (two) times daily.   midodrine (PROAMATINE) 10 MG tablet TAKE 1 TABLET BY MOUTH THREE TIMES DAILY WITH MEALS   Multiple  Vitamins-Minerals (CENTRUM SILVER 50+MEN) TABS Take 1 tablet by mouth daily.   multivitamin (RENA-VIT) TABS tablet Take 1 tablet by mouth at bedtime.   Polyethylene Glycol 3350 (MIRALAX PO) Take by mouth as needed.   senna (SENOKOT) 8.6 MG tablet Take 1 tablet by mouth daily as needed for constipation.    silver sulfADIAZINE (SILVADENE) 1 % cream Apply to affected area daily (Patient not taking: Reported on 09/14/2021)   tamsulosin (FLOMAX) 0.4 MG CAPS capsule Take 1 capsule (0.4 mg total) by mouth daily.   [DISCONTINUED] ciprofloxacin (CIPRO) 250 MG tablet Take 1 tablet (250 mg total) by mouth daily.   [DISCONTINUED] tamsulosin (FLOMAX) 0.4 MG CAPS capsule Take 1 capsule (0.4 mg total) by mouth daily.   No facility-administered encounter medications on file as of 12/17/2021.    Past Medical History:  Diagnosis Date   Arthritis    Cancer ([place])    prostate   Cardiomyopathy    secondary   CKD (chronic kidney disease)    DM2 (diabetes mellitus, type 2) (HCC)    Gout    HTN (hypertension)    unspec   Hypercholesterolemia    Nonischemic cardiomyopathy (HCC)    a. EF 40-50% by most recent 2-D echo b. EF 25% by cardiac cath in 2004   Overweight(278.02)     Past Surgical History:  Procedure Laterality Date   AV FISTULA PLACEMENT Left 06/06/2020   Procedure: LEFT ARM ARTERIOVENOUS (AV) GRAFT USING 45CM GORTEX;  Surgeon: [name], MD;  Location: [place];  Service: Vascular;  Laterality: Left;   COLONOSCOPY     HERNIA REPAIR     INSERTION OF DIALYSIS CATHETER Right 06/06/2020   Procedure: INSERTION OF DIALYSIS CATHETER USING 23CM DOUBLE LUMEN CATHETER;  Surgeon: [name], MD;  Location: [place];  Service: Vascular;  Laterality: Right;   IR FLUORO GUIDE CV LINE RIGHT  05/20/2020   IR US GUIDE VASC ACCESS RIGHT  05/20/2020   KNEE ARTHROPLASTY Left 08/08/2017   Procedure: LEFT TOTAL KNEE ARTHROPLASTY WITH COMPUTER NAVIGATION;  Surgeon: Rod Can, MD;  Location: [place];  Service:  Orthopedics;  Laterality: Left;  Needs RNFA   KNEE ARTHROPLASTY Right 11/17/2017   Procedure: RIGHT TOTAL KNEE ARTHROPLASTY WITH COMPUTER NAVIGATION;  Surgeon: Rod Can, MD;  [facility];  Service: Orthopedics;  Laterality: Right;  NEEDS RNFA   ROBOT ASSISTED LAPAROSCOPIC NEPHRECTOMY Right 05/16/2020   Procedure: XI ROBOTIC ASSISTED LAPAROSCOPIC NEPHRECTOMY;  Surgeon: Cleon Gustin, MD;  Location:  [facility];  Service: Urology;  Laterality: Right;  2.5 hrs    Social History   Socioeconomic History   Marital status: Married    Spouse name: Not on file   Number of children: 2   Years of education: Not on file   Highest education level: Not on file  Occupational History   Occupation: retired  Tobacco Use   Smoking status: Former    Packs/day: 0.50    Years: 20.00    Pack years: 10.00    Types: Cigarettes    Start date: 02/12/1966    Quit date: 11/08/1984    Years since quitting: 37.1   Smokeless tobacco: Never  Vaping Use   Vaping Use: Never used  Substance and Sexual Activity   Alcohol use: No    Alcohol/week: 0.0 standard drinks   Drug use: No   Sexual activity: Yes    Birth control/protection: None  Other Topics Concern   Not on file  Social History Narrative   Full time.    Social Determinants of Health   Financial Resource Strain: Not on file  Food Insecurity: Not on file  Transportation Needs: Not on file  Physical Activity: Not on file  Stress: Not on file  Social Connections: Not on file  Intimate Partner Violence: Not on file    Family History  Problem Relation Age of Onset   Hypertension Mother    Kidney disease Mother    Heart disease Father    Kidney disease Other        Objective: Vitals:   12/17/21 1418  BP: 121/64  Pulse: 83     Physical Exam  Lab Results:  PSA No results found for: PSA No results found for: TESTOSTERONE  Results for orders placed or performed in visit on 12/17/21 (from the past 24 hour(s))  Urinalysis,  Routine w reflex microscopic     Status: Abnormal   Collection Time: 12/17/21  3:23 PM  Result Value Ref Range   Specific Gravity, UA 1.020 1.005 - 1.030   pH, UA 5.5 5.0 - 7.5   Color, UA Amber (A) Yellow   Appearance Ur Clear Clear   Leukocytes,UA Negative Negative   Protein,UA 1+ (A) Negative/Trace   Glucose, UA Negative Negative   Ketones, UA 1+ (A) Negative   RBC, UA Trace (A) Negative   Bilirubin, UA Negative Negative   Urobilinogen, Ur 0.2 0.2 - 1.0 mg/dL   Nitrite, UA Negative Negative   Microscopic Examination See below:    Narrative   Performed at:  [facility] [address] [place], [place]  [id number] Lab Director: Mina Marble MT, Phone:  [phone]  Microscopic Examination     Status: None   Collection Time: 12/17/21  3:23 PM   Urine  Result Value Ref Range   WBC, UA None seen 0 - 5 /hpf   RBC 0-2 0 - 2 /hpf   Epithelial Cells (non renal) None seen 0 - 10 /hpf   Renal Epithel, UA None seen None seen /hpf   Bacteria, UA None seen None seen/Few   Narrative   Performed at:  [facility] [address], [place], [place]  [id number] Lab Director: [place], Phone:  [phone]   UA is unremarkable.  Lab Results  Component Value Date   PSA1 0.8 12/10/2021   PSA1 0.6 04/02/2021     Studies/Results:  CLINICAL DATA:  Follow-up right-sided renal cell carcinoma, status post right nephrectomy, additional history  of prostate cancer   EXAM: CT ABDOMEN AND PELVIS WITHOUT CONTRAST   TECHNIQUE: Multidetector CT imaging of the abdomen and pelvis was performed following the standard protocol without IV contrast.   COMPARISON:  05/28/2020   FINDINGS: Lower chest: No acute abnormality.  Coronary artery calcifications.   Hepatobiliary: No solid liver abnormality is seen. No gallstones, gallbladder wall thickening, or biliary dilatation.   Pancreas: Unremarkable. No pancreatic ductal dilatation or surrounding inflammatory  changes.   Spleen: Normal in size without significant abnormality.   Adrenals/Urinary Tract: Adrenal glands are unremarkable. Redemonstrated postoperative findings right nephrectomy. No evidence of recurrent soft tissue. Partially exophytic benign simple cyst of the left kidney, which is otherwise normal. No hydronephrosis. Thickening of the decompressed urinary bladder, likely related to chronic outlet obstruction.   Stomach/Bowel: Stomach is within normal limits. Appendix appears normal. No evidence of bowel wall thickening, distention, or inflammatory changes.   Vascular/Lymphatic: Aortic atherosclerosis. No enlarged abdominal or pelvic lymph nodes.   Reproductive: Biopsy marking clips in the prostate.   Other: No abdominal wall hernia or abnormality. No abdominopelvic ascites.   Musculoskeletal: No acute or significant osseous findings.   IMPRESSION: 1. Redemonstrated postoperative findings of right nephrectomy. No noncontrast evidence of recurrent soft tissue or metastatic disease in the abdomen or pelvis. 2. Thickening of the decompressed urinary bladder, likely related to chronic outlet obstruction. 3. Coronary artery disease.   Aortic Atherosclerosis (ICD10-I70.0).     Electronically Signed   By: Delanna Ahmadi M.D.   On: 08/21/2021 09:00      Assessment & Plan: LUTS with Dysuria and history of UTI.  He is doing well since getting post dialysis Levaquin for 6 doses.   His symptoms have resolved.   Hx of prostate cancer with prior radiation therapy.  PSA up slightly but still low.   F/u in 6 months with a PSA.   Incomplete bladder emptying.  He is doing well on tamsulosin.  Hx of right RCCa with prior nephrectomy.  CXR per Dr. Alyson Ingles in 2 months.   Meds ordered this encounter  Medications   tamsulosin (FLOMAX) 0.4 MG CAPS capsule    Sig: Take 1 capsule (0.4 mg total) by mouth daily.    Dispense:  90 capsule    Refill:  3      Orders Placed This  Encounter  Procedures   Microscopic Examination   Urinalysis, Routine w reflex microscopic   PSA    Standing Status:   Future    Standing Expiration Date:   12/17/2022      Return in about 6 months (around 06/16/2022) for with PSA.Marland Kitchen   CC: Monico Blitz, MD      Irine Seal 12/18/2021 Patient ID: Todd Robertson, male   DOB: [birth year], 76 y.o.   MRN: [id number] Patient ID: Todd Robertson, male   DOB: November 01, 1946, 76 y.o.   MRN: [id number] Patient ID: Todd Robertson, male   DOB: 09-20-1946, 76 y.o.   MRN: [id number]

## 2021-12-18 DIAGNOSIS — N186 End stage renal disease: Secondary | ICD-10-CM | POA: Diagnosis not present

## 2021-12-18 DIAGNOSIS — Z992 Dependence on renal dialysis: Secondary | ICD-10-CM | POA: Diagnosis not present

## 2021-12-18 DIAGNOSIS — D631 Anemia in chronic kidney disease: Secondary | ICD-10-CM | POA: Diagnosis not present

## 2021-12-18 DIAGNOSIS — N2581 Secondary hyperparathyroidism of renal origin: Secondary | ICD-10-CM | POA: Diagnosis not present

## 2021-12-18 DIAGNOSIS — D509 Iron deficiency anemia, unspecified: Secondary | ICD-10-CM | POA: Diagnosis not present

## 2021-12-21 DIAGNOSIS — N2581 Secondary hyperparathyroidism of renal origin: Secondary | ICD-10-CM | POA: Diagnosis not present

## 2021-12-21 DIAGNOSIS — Z992 Dependence on renal dialysis: Secondary | ICD-10-CM | POA: Diagnosis not present

## 2021-12-21 DIAGNOSIS — D509 Iron deficiency anemia, unspecified: Secondary | ICD-10-CM | POA: Diagnosis not present

## 2021-12-21 DIAGNOSIS — D631 Anemia in chronic kidney disease: Secondary | ICD-10-CM | POA: Diagnosis not present

## 2021-12-21 DIAGNOSIS — N186 End stage renal disease: Secondary | ICD-10-CM | POA: Diagnosis not present

## 2021-12-23 DIAGNOSIS — Z992 Dependence on renal dialysis: Secondary | ICD-10-CM | POA: Diagnosis not present

## 2021-12-23 DIAGNOSIS — D631 Anemia in chronic kidney disease: Secondary | ICD-10-CM | POA: Diagnosis not present

## 2021-12-23 DIAGNOSIS — N2581 Secondary hyperparathyroidism of renal origin: Secondary | ICD-10-CM | POA: Diagnosis not present

## 2021-12-23 DIAGNOSIS — N186 End stage renal disease: Secondary | ICD-10-CM | POA: Diagnosis not present

## 2021-12-23 DIAGNOSIS — D509 Iron deficiency anemia, unspecified: Secondary | ICD-10-CM | POA: Diagnosis not present

## 2021-12-25 DIAGNOSIS — D631 Anemia in chronic kidney disease: Secondary | ICD-10-CM | POA: Diagnosis not present

## 2021-12-25 DIAGNOSIS — D509 Iron deficiency anemia, unspecified: Secondary | ICD-10-CM | POA: Diagnosis not present

## 2021-12-25 DIAGNOSIS — Z992 Dependence on renal dialysis: Secondary | ICD-10-CM | POA: Diagnosis not present

## 2021-12-25 DIAGNOSIS — N186 End stage renal disease: Secondary | ICD-10-CM | POA: Diagnosis not present

## 2021-12-25 DIAGNOSIS — N2581 Secondary hyperparathyroidism of renal origin: Secondary | ICD-10-CM | POA: Diagnosis not present

## 2021-12-28 DIAGNOSIS — D631 Anemia in chronic kidney disease: Secondary | ICD-10-CM | POA: Diagnosis not present

## 2021-12-28 DIAGNOSIS — N2581 Secondary hyperparathyroidism of renal origin: Secondary | ICD-10-CM | POA: Diagnosis not present

## 2021-12-28 DIAGNOSIS — N186 End stage renal disease: Secondary | ICD-10-CM | POA: Diagnosis not present

## 2021-12-28 DIAGNOSIS — D509 Iron deficiency anemia, unspecified: Secondary | ICD-10-CM | POA: Diagnosis not present

## 2021-12-28 DIAGNOSIS — Z992 Dependence on renal dialysis: Secondary | ICD-10-CM | POA: Diagnosis not present

## 2021-12-30 DIAGNOSIS — Z992 Dependence on renal dialysis: Secondary | ICD-10-CM | POA: Diagnosis not present

## 2021-12-30 DIAGNOSIS — D631 Anemia in chronic kidney disease: Secondary | ICD-10-CM | POA: Diagnosis not present

## 2021-12-30 DIAGNOSIS — N2581 Secondary hyperparathyroidism of renal origin: Secondary | ICD-10-CM | POA: Diagnosis not present

## 2021-12-30 DIAGNOSIS — N186 End stage renal disease: Secondary | ICD-10-CM | POA: Diagnosis not present

## 2021-12-30 DIAGNOSIS — D509 Iron deficiency anemia, unspecified: Secondary | ICD-10-CM | POA: Diagnosis not present

## 2022-01-01 DIAGNOSIS — N2581 Secondary hyperparathyroidism of renal origin: Secondary | ICD-10-CM | POA: Diagnosis not present

## 2022-01-01 DIAGNOSIS — N186 End stage renal disease: Secondary | ICD-10-CM | POA: Diagnosis not present

## 2022-01-01 DIAGNOSIS — D509 Iron deficiency anemia, unspecified: Secondary | ICD-10-CM | POA: Diagnosis not present

## 2022-01-01 DIAGNOSIS — D631 Anemia in chronic kidney disease: Secondary | ICD-10-CM | POA: Diagnosis not present

## 2022-01-01 DIAGNOSIS — Z992 Dependence on renal dialysis: Secondary | ICD-10-CM | POA: Diagnosis not present

## 2022-01-04 DIAGNOSIS — D509 Iron deficiency anemia, unspecified: Secondary | ICD-10-CM | POA: Diagnosis not present

## 2022-01-04 DIAGNOSIS — Z992 Dependence on renal dialysis: Secondary | ICD-10-CM | POA: Diagnosis not present

## 2022-01-04 DIAGNOSIS — D631 Anemia in chronic kidney disease: Secondary | ICD-10-CM | POA: Diagnosis not present

## 2022-01-04 DIAGNOSIS — N2581 Secondary hyperparathyroidism of renal origin: Secondary | ICD-10-CM | POA: Diagnosis not present

## 2022-01-04 DIAGNOSIS — N186 End stage renal disease: Secondary | ICD-10-CM | POA: Diagnosis not present

## 2022-01-05 DIAGNOSIS — N186 End stage renal disease: Secondary | ICD-10-CM | POA: Diagnosis not present

## 2022-01-05 DIAGNOSIS — Z992 Dependence on renal dialysis: Secondary | ICD-10-CM | POA: Diagnosis not present

## 2022-01-06 DIAGNOSIS — N186 End stage renal disease: Secondary | ICD-10-CM | POA: Diagnosis not present

## 2022-01-06 DIAGNOSIS — D631 Anemia in chronic kidney disease: Secondary | ICD-10-CM | POA: Diagnosis not present

## 2022-01-06 DIAGNOSIS — N2581 Secondary hyperparathyroidism of renal origin: Secondary | ICD-10-CM | POA: Diagnosis not present

## 2022-01-06 DIAGNOSIS — D509 Iron deficiency anemia, unspecified: Secondary | ICD-10-CM | POA: Diagnosis not present

## 2022-01-06 DIAGNOSIS — Z992 Dependence on renal dialysis: Secondary | ICD-10-CM | POA: Diagnosis not present

## 2022-01-08 DIAGNOSIS — N186 End stage renal disease: Secondary | ICD-10-CM | POA: Diagnosis not present

## 2022-01-08 DIAGNOSIS — D631 Anemia in chronic kidney disease: Secondary | ICD-10-CM | POA: Diagnosis not present

## 2022-01-08 DIAGNOSIS — N2581 Secondary hyperparathyroidism of renal origin: Secondary | ICD-10-CM | POA: Diagnosis not present

## 2022-01-08 DIAGNOSIS — Z992 Dependence on renal dialysis: Secondary | ICD-10-CM | POA: Diagnosis not present

## 2022-01-08 DIAGNOSIS — D509 Iron deficiency anemia, unspecified: Secondary | ICD-10-CM | POA: Diagnosis not present

## 2022-01-11 DIAGNOSIS — Z992 Dependence on renal dialysis: Secondary | ICD-10-CM | POA: Diagnosis not present

## 2022-01-11 DIAGNOSIS — D509 Iron deficiency anemia, unspecified: Secondary | ICD-10-CM | POA: Diagnosis not present

## 2022-01-11 DIAGNOSIS — N186 End stage renal disease: Secondary | ICD-10-CM | POA: Diagnosis not present

## 2022-01-11 DIAGNOSIS — N2581 Secondary hyperparathyroidism of renal origin: Secondary | ICD-10-CM | POA: Diagnosis not present

## 2022-01-11 DIAGNOSIS — D631 Anemia in chronic kidney disease: Secondary | ICD-10-CM | POA: Diagnosis not present

## 2022-01-13 DIAGNOSIS — N186 End stage renal disease: Secondary | ICD-10-CM | POA: Diagnosis not present

## 2022-01-13 DIAGNOSIS — Z992 Dependence on renal dialysis: Secondary | ICD-10-CM | POA: Diagnosis not present

## 2022-01-13 DIAGNOSIS — N2581 Secondary hyperparathyroidism of renal origin: Secondary | ICD-10-CM | POA: Diagnosis not present

## 2022-01-13 DIAGNOSIS — D509 Iron deficiency anemia, unspecified: Secondary | ICD-10-CM | POA: Diagnosis not present

## 2022-01-13 DIAGNOSIS — D631 Anemia in chronic kidney disease: Secondary | ICD-10-CM | POA: Diagnosis not present

## 2022-01-15 DIAGNOSIS — D509 Iron deficiency anemia, unspecified: Secondary | ICD-10-CM | POA: Diagnosis not present

## 2022-01-15 DIAGNOSIS — N2581 Secondary hyperparathyroidism of renal origin: Secondary | ICD-10-CM | POA: Diagnosis not present

## 2022-01-15 DIAGNOSIS — N186 End stage renal disease: Secondary | ICD-10-CM | POA: Diagnosis not present

## 2022-01-15 DIAGNOSIS — D631 Anemia in chronic kidney disease: Secondary | ICD-10-CM | POA: Diagnosis not present

## 2022-01-15 DIAGNOSIS — Z992 Dependence on renal dialysis: Secondary | ICD-10-CM | POA: Diagnosis not present

## 2022-01-18 DIAGNOSIS — D509 Iron deficiency anemia, unspecified: Secondary | ICD-10-CM | POA: Diagnosis not present

## 2022-01-18 DIAGNOSIS — D631 Anemia in chronic kidney disease: Secondary | ICD-10-CM | POA: Diagnosis not present

## 2022-01-18 DIAGNOSIS — N2581 Secondary hyperparathyroidism of renal origin: Secondary | ICD-10-CM | POA: Diagnosis not present

## 2022-01-18 DIAGNOSIS — Z992 Dependence on renal dialysis: Secondary | ICD-10-CM | POA: Diagnosis not present

## 2022-01-18 DIAGNOSIS — N186 End stage renal disease: Secondary | ICD-10-CM | POA: Diagnosis not present

## 2022-01-19 DIAGNOSIS — Z299 Encounter for prophylactic measures, unspecified: Secondary | ICD-10-CM | POA: Diagnosis not present

## 2022-01-19 DIAGNOSIS — Z87891 Personal history of nicotine dependence: Secondary | ICD-10-CM | POA: Diagnosis not present

## 2022-01-19 DIAGNOSIS — M25511 Pain in right shoulder: Secondary | ICD-10-CM | POA: Diagnosis not present

## 2022-01-19 DIAGNOSIS — J441 Chronic obstructive pulmonary disease with (acute) exacerbation: Secondary | ICD-10-CM | POA: Diagnosis not present

## 2022-01-19 DIAGNOSIS — E1165 Type 2 diabetes mellitus with hyperglycemia: Secondary | ICD-10-CM | POA: Diagnosis not present

## 2022-01-19 DIAGNOSIS — I10 Essential (primary) hypertension: Secondary | ICD-10-CM | POA: Diagnosis not present

## 2022-01-19 DIAGNOSIS — Z6829 Body mass index (BMI) 29.0-29.9, adult: Secondary | ICD-10-CM | POA: Diagnosis not present

## 2022-01-19 DIAGNOSIS — M25512 Pain in left shoulder: Secondary | ICD-10-CM | POA: Diagnosis not present

## 2022-01-20 DIAGNOSIS — Z992 Dependence on renal dialysis: Secondary | ICD-10-CM | POA: Diagnosis not present

## 2022-01-20 DIAGNOSIS — D509 Iron deficiency anemia, unspecified: Secondary | ICD-10-CM | POA: Diagnosis not present

## 2022-01-20 DIAGNOSIS — D631 Anemia in chronic kidney disease: Secondary | ICD-10-CM | POA: Diagnosis not present

## 2022-01-20 DIAGNOSIS — N186 End stage renal disease: Secondary | ICD-10-CM | POA: Diagnosis not present

## 2022-01-20 DIAGNOSIS — N2581 Secondary hyperparathyroidism of renal origin: Secondary | ICD-10-CM | POA: Diagnosis not present

## 2022-01-21 DIAGNOSIS — Z299 Encounter for prophylactic measures, unspecified: Secondary | ICD-10-CM | POA: Diagnosis not present

## 2022-01-21 DIAGNOSIS — I10 Essential (primary) hypertension: Secondary | ICD-10-CM | POA: Diagnosis not present

## 2022-01-21 DIAGNOSIS — Z6829 Body mass index (BMI) 29.0-29.9, adult: Secondary | ICD-10-CM | POA: Diagnosis not present

## 2022-01-21 DIAGNOSIS — M25511 Pain in right shoulder: Secondary | ICD-10-CM | POA: Diagnosis not present

## 2022-01-22 DIAGNOSIS — N186 End stage renal disease: Secondary | ICD-10-CM | POA: Diagnosis not present

## 2022-01-22 DIAGNOSIS — Z992 Dependence on renal dialysis: Secondary | ICD-10-CM | POA: Diagnosis not present

## 2022-01-22 DIAGNOSIS — D631 Anemia in chronic kidney disease: Secondary | ICD-10-CM | POA: Diagnosis not present

## 2022-01-22 DIAGNOSIS — D509 Iron deficiency anemia, unspecified: Secondary | ICD-10-CM | POA: Diagnosis not present

## 2022-01-22 DIAGNOSIS — N2581 Secondary hyperparathyroidism of renal origin: Secondary | ICD-10-CM | POA: Diagnosis not present

## 2022-01-25 DIAGNOSIS — N2581 Secondary hyperparathyroidism of renal origin: Secondary | ICD-10-CM | POA: Diagnosis not present

## 2022-01-25 DIAGNOSIS — N186 End stage renal disease: Secondary | ICD-10-CM | POA: Diagnosis not present

## 2022-01-25 DIAGNOSIS — D631 Anemia in chronic kidney disease: Secondary | ICD-10-CM | POA: Diagnosis not present

## 2022-01-25 DIAGNOSIS — Z992 Dependence on renal dialysis: Secondary | ICD-10-CM | POA: Diagnosis not present

## 2022-01-25 DIAGNOSIS — D509 Iron deficiency anemia, unspecified: Secondary | ICD-10-CM | POA: Diagnosis not present

## 2022-01-27 DIAGNOSIS — Z992 Dependence on renal dialysis: Secondary | ICD-10-CM | POA: Diagnosis not present

## 2022-01-27 DIAGNOSIS — D631 Anemia in chronic kidney disease: Secondary | ICD-10-CM | POA: Diagnosis not present

## 2022-01-27 DIAGNOSIS — D509 Iron deficiency anemia, unspecified: Secondary | ICD-10-CM | POA: Diagnosis not present

## 2022-01-27 DIAGNOSIS — N2581 Secondary hyperparathyroidism of renal origin: Secondary | ICD-10-CM | POA: Diagnosis not present

## 2022-01-27 DIAGNOSIS — N186 End stage renal disease: Secondary | ICD-10-CM | POA: Diagnosis not present

## 2022-01-29 DIAGNOSIS — Z992 Dependence on renal dialysis: Secondary | ICD-10-CM | POA: Diagnosis not present

## 2022-01-29 DIAGNOSIS — N2581 Secondary hyperparathyroidism of renal origin: Secondary | ICD-10-CM | POA: Diagnosis not present

## 2022-01-29 DIAGNOSIS — D631 Anemia in chronic kidney disease: Secondary | ICD-10-CM | POA: Diagnosis not present

## 2022-01-29 DIAGNOSIS — D509 Iron deficiency anemia, unspecified: Secondary | ICD-10-CM | POA: Diagnosis not present

## 2022-01-29 DIAGNOSIS — N186 End stage renal disease: Secondary | ICD-10-CM | POA: Diagnosis not present

## 2022-02-01 DIAGNOSIS — D509 Iron deficiency anemia, unspecified: Secondary | ICD-10-CM | POA: Diagnosis not present

## 2022-02-01 DIAGNOSIS — E1140 Type 2 diabetes mellitus with diabetic neuropathy, unspecified: Secondary | ICD-10-CM | POA: Diagnosis not present

## 2022-02-01 DIAGNOSIS — E119 Type 2 diabetes mellitus without complications: Secondary | ICD-10-CM | POA: Diagnosis not present

## 2022-02-01 DIAGNOSIS — Z992 Dependence on renal dialysis: Secondary | ICD-10-CM | POA: Diagnosis not present

## 2022-02-01 DIAGNOSIS — E1151 Type 2 diabetes mellitus with diabetic peripheral angiopathy without gangrene: Secondary | ICD-10-CM | POA: Diagnosis not present

## 2022-02-01 DIAGNOSIS — D631 Anemia in chronic kidney disease: Secondary | ICD-10-CM | POA: Diagnosis not present

## 2022-02-01 DIAGNOSIS — B351 Tinea unguium: Secondary | ICD-10-CM | POA: Diagnosis not present

## 2022-02-01 DIAGNOSIS — L110 Acquired keratosis follicularis: Secondary | ICD-10-CM | POA: Diagnosis not present

## 2022-02-01 DIAGNOSIS — N186 End stage renal disease: Secondary | ICD-10-CM | POA: Diagnosis not present

## 2022-02-01 DIAGNOSIS — N2581 Secondary hyperparathyroidism of renal origin: Secondary | ICD-10-CM | POA: Diagnosis not present

## 2022-02-02 DIAGNOSIS — Z20822 Contact with and (suspected) exposure to covid-19: Secondary | ICD-10-CM | POA: Diagnosis not present

## 2022-02-03 DIAGNOSIS — D509 Iron deficiency anemia, unspecified: Secondary | ICD-10-CM | POA: Diagnosis not present

## 2022-02-03 DIAGNOSIS — Z992 Dependence on renal dialysis: Secondary | ICD-10-CM | POA: Diagnosis not present

## 2022-02-03 DIAGNOSIS — N186 End stage renal disease: Secondary | ICD-10-CM | POA: Diagnosis not present

## 2022-02-03 DIAGNOSIS — N2581 Secondary hyperparathyroidism of renal origin: Secondary | ICD-10-CM | POA: Diagnosis not present

## 2022-02-03 DIAGNOSIS — D631 Anemia in chronic kidney disease: Secondary | ICD-10-CM | POA: Diagnosis not present

## 2022-02-05 DIAGNOSIS — D631 Anemia in chronic kidney disease: Secondary | ICD-10-CM | POA: Diagnosis not present

## 2022-02-05 DIAGNOSIS — Z992 Dependence on renal dialysis: Secondary | ICD-10-CM | POA: Diagnosis not present

## 2022-02-05 DIAGNOSIS — N186 End stage renal disease: Secondary | ICD-10-CM | POA: Diagnosis not present

## 2022-02-05 DIAGNOSIS — N2581 Secondary hyperparathyroidism of renal origin: Secondary | ICD-10-CM | POA: Diagnosis not present

## 2022-02-05 DIAGNOSIS — D509 Iron deficiency anemia, unspecified: Secondary | ICD-10-CM | POA: Diagnosis not present

## 2022-02-08 DIAGNOSIS — E559 Vitamin D deficiency, unspecified: Secondary | ICD-10-CM | POA: Diagnosis not present

## 2022-02-08 DIAGNOSIS — N250 Renal osteodystrophy: Secondary | ICD-10-CM | POA: Diagnosis not present

## 2022-02-08 DIAGNOSIS — N186 End stage renal disease: Secondary | ICD-10-CM | POA: Diagnosis not present

## 2022-02-08 DIAGNOSIS — Z992 Dependence on renal dialysis: Secondary | ICD-10-CM | POA: Diagnosis not present

## 2022-02-08 DIAGNOSIS — D631 Anemia in chronic kidney disease: Secondary | ICD-10-CM | POA: Diagnosis not present

## 2022-02-08 DIAGNOSIS — N2581 Secondary hyperparathyroidism of renal origin: Secondary | ICD-10-CM | POA: Diagnosis not present

## 2022-02-08 DIAGNOSIS — D509 Iron deficiency anemia, unspecified: Secondary | ICD-10-CM | POA: Diagnosis not present

## 2022-02-10 DIAGNOSIS — D509 Iron deficiency anemia, unspecified: Secondary | ICD-10-CM | POA: Diagnosis not present

## 2022-02-10 DIAGNOSIS — D631 Anemia in chronic kidney disease: Secondary | ICD-10-CM | POA: Diagnosis not present

## 2022-02-10 DIAGNOSIS — N2581 Secondary hyperparathyroidism of renal origin: Secondary | ICD-10-CM | POA: Diagnosis not present

## 2022-02-10 DIAGNOSIS — N250 Renal osteodystrophy: Secondary | ICD-10-CM | POA: Diagnosis not present

## 2022-02-10 DIAGNOSIS — N186 End stage renal disease: Secondary | ICD-10-CM | POA: Diagnosis not present

## 2022-02-10 DIAGNOSIS — Z992 Dependence on renal dialysis: Secondary | ICD-10-CM | POA: Diagnosis not present

## 2022-02-12 DIAGNOSIS — N2581 Secondary hyperparathyroidism of renal origin: Secondary | ICD-10-CM | POA: Diagnosis not present

## 2022-02-12 DIAGNOSIS — N250 Renal osteodystrophy: Secondary | ICD-10-CM | POA: Diagnosis not present

## 2022-02-12 DIAGNOSIS — D509 Iron deficiency anemia, unspecified: Secondary | ICD-10-CM | POA: Diagnosis not present

## 2022-02-12 DIAGNOSIS — N186 End stage renal disease: Secondary | ICD-10-CM | POA: Diagnosis not present

## 2022-02-12 DIAGNOSIS — D631 Anemia in chronic kidney disease: Secondary | ICD-10-CM | POA: Diagnosis not present

## 2022-02-12 DIAGNOSIS — Z992 Dependence on renal dialysis: Secondary | ICD-10-CM | POA: Diagnosis not present

## 2022-02-15 DIAGNOSIS — N2581 Secondary hyperparathyroidism of renal origin: Secondary | ICD-10-CM | POA: Diagnosis not present

## 2022-02-15 DIAGNOSIS — N250 Renal osteodystrophy: Secondary | ICD-10-CM | POA: Diagnosis not present

## 2022-02-15 DIAGNOSIS — N186 End stage renal disease: Secondary | ICD-10-CM | POA: Diagnosis not present

## 2022-02-15 DIAGNOSIS — Z992 Dependence on renal dialysis: Secondary | ICD-10-CM | POA: Diagnosis not present

## 2022-02-15 DIAGNOSIS — D509 Iron deficiency anemia, unspecified: Secondary | ICD-10-CM | POA: Diagnosis not present

## 2022-02-15 DIAGNOSIS — D631 Anemia in chronic kidney disease: Secondary | ICD-10-CM | POA: Diagnosis not present

## 2022-02-17 DIAGNOSIS — D509 Iron deficiency anemia, unspecified: Secondary | ICD-10-CM | POA: Diagnosis not present

## 2022-02-17 DIAGNOSIS — D631 Anemia in chronic kidney disease: Secondary | ICD-10-CM | POA: Diagnosis not present

## 2022-02-17 DIAGNOSIS — N2581 Secondary hyperparathyroidism of renal origin: Secondary | ICD-10-CM | POA: Diagnosis not present

## 2022-02-17 DIAGNOSIS — N186 End stage renal disease: Secondary | ICD-10-CM | POA: Diagnosis not present

## 2022-02-17 DIAGNOSIS — N250 Renal osteodystrophy: Secondary | ICD-10-CM | POA: Diagnosis not present

## 2022-02-17 DIAGNOSIS — Z992 Dependence on renal dialysis: Secondary | ICD-10-CM | POA: Diagnosis not present

## 2022-02-19 DIAGNOSIS — N250 Renal osteodystrophy: Secondary | ICD-10-CM | POA: Diagnosis not present

## 2022-02-19 DIAGNOSIS — N2581 Secondary hyperparathyroidism of renal origin: Secondary | ICD-10-CM | POA: Diagnosis not present

## 2022-02-19 DIAGNOSIS — D509 Iron deficiency anemia, unspecified: Secondary | ICD-10-CM | POA: Diagnosis not present

## 2022-02-19 DIAGNOSIS — D631 Anemia in chronic kidney disease: Secondary | ICD-10-CM | POA: Diagnosis not present

## 2022-02-19 DIAGNOSIS — N186 End stage renal disease: Secondary | ICD-10-CM | POA: Diagnosis not present

## 2022-02-19 DIAGNOSIS — Z992 Dependence on renal dialysis: Secondary | ICD-10-CM | POA: Diagnosis not present

## 2022-02-22 DIAGNOSIS — N250 Renal osteodystrophy: Secondary | ICD-10-CM | POA: Diagnosis not present

## 2022-02-22 DIAGNOSIS — N2581 Secondary hyperparathyroidism of renal origin: Secondary | ICD-10-CM | POA: Diagnosis not present

## 2022-02-22 DIAGNOSIS — N186 End stage renal disease: Secondary | ICD-10-CM | POA: Diagnosis not present

## 2022-02-22 DIAGNOSIS — D509 Iron deficiency anemia, unspecified: Secondary | ICD-10-CM | POA: Diagnosis not present

## 2022-02-22 DIAGNOSIS — Z992 Dependence on renal dialysis: Secondary | ICD-10-CM | POA: Diagnosis not present

## 2022-02-22 DIAGNOSIS — D631 Anemia in chronic kidney disease: Secondary | ICD-10-CM | POA: Diagnosis not present

## 2022-02-24 ENCOUNTER — Ambulatory Visit: Payer: Medicare Other | Admitting: Urology

## 2022-02-24 DIAGNOSIS — D631 Anemia in chronic kidney disease: Secondary | ICD-10-CM | POA: Diagnosis not present

## 2022-02-24 DIAGNOSIS — N250 Renal osteodystrophy: Secondary | ICD-10-CM | POA: Diagnosis not present

## 2022-02-24 DIAGNOSIS — D509 Iron deficiency anemia, unspecified: Secondary | ICD-10-CM | POA: Diagnosis not present

## 2022-02-24 DIAGNOSIS — Z992 Dependence on renal dialysis: Secondary | ICD-10-CM | POA: Diagnosis not present

## 2022-02-24 DIAGNOSIS — N2581 Secondary hyperparathyroidism of renal origin: Secondary | ICD-10-CM | POA: Diagnosis not present

## 2022-02-24 DIAGNOSIS — N186 End stage renal disease: Secondary | ICD-10-CM | POA: Diagnosis not present

## 2022-02-25 DIAGNOSIS — Z20822 Contact with and (suspected) exposure to covid-19: Secondary | ICD-10-CM | POA: Diagnosis not present

## 2022-02-26 DIAGNOSIS — D631 Anemia in chronic kidney disease: Secondary | ICD-10-CM | POA: Diagnosis not present

## 2022-02-26 DIAGNOSIS — N2581 Secondary hyperparathyroidism of renal origin: Secondary | ICD-10-CM | POA: Diagnosis not present

## 2022-02-26 DIAGNOSIS — N186 End stage renal disease: Secondary | ICD-10-CM | POA: Diagnosis not present

## 2022-02-26 DIAGNOSIS — Z992 Dependence on renal dialysis: Secondary | ICD-10-CM | POA: Diagnosis not present

## 2022-02-26 DIAGNOSIS — N250 Renal osteodystrophy: Secondary | ICD-10-CM | POA: Diagnosis not present

## 2022-02-26 DIAGNOSIS — D509 Iron deficiency anemia, unspecified: Secondary | ICD-10-CM | POA: Diagnosis not present

## 2022-03-01 DIAGNOSIS — N186 End stage renal disease: Secondary | ICD-10-CM | POA: Diagnosis not present

## 2022-03-01 DIAGNOSIS — N250 Renal osteodystrophy: Secondary | ICD-10-CM | POA: Diagnosis not present

## 2022-03-01 DIAGNOSIS — D509 Iron deficiency anemia, unspecified: Secondary | ICD-10-CM | POA: Diagnosis not present

## 2022-03-01 DIAGNOSIS — N2581 Secondary hyperparathyroidism of renal origin: Secondary | ICD-10-CM | POA: Diagnosis not present

## 2022-03-01 DIAGNOSIS — Z992 Dependence on renal dialysis: Secondary | ICD-10-CM | POA: Diagnosis not present

## 2022-03-01 DIAGNOSIS — D631 Anemia in chronic kidney disease: Secondary | ICD-10-CM | POA: Diagnosis not present

## 2022-03-03 DIAGNOSIS — D509 Iron deficiency anemia, unspecified: Secondary | ICD-10-CM | POA: Diagnosis not present

## 2022-03-03 DIAGNOSIS — N250 Renal osteodystrophy: Secondary | ICD-10-CM | POA: Diagnosis not present

## 2022-03-03 DIAGNOSIS — N186 End stage renal disease: Secondary | ICD-10-CM | POA: Diagnosis not present

## 2022-03-03 DIAGNOSIS — D631 Anemia in chronic kidney disease: Secondary | ICD-10-CM | POA: Diagnosis not present

## 2022-03-03 DIAGNOSIS — N2581 Secondary hyperparathyroidism of renal origin: Secondary | ICD-10-CM | POA: Diagnosis not present

## 2022-03-03 DIAGNOSIS — Z992 Dependence on renal dialysis: Secondary | ICD-10-CM | POA: Diagnosis not present

## 2022-03-05 DIAGNOSIS — N250 Renal osteodystrophy: Secondary | ICD-10-CM | POA: Diagnosis not present

## 2022-03-05 DIAGNOSIS — N2581 Secondary hyperparathyroidism of renal origin: Secondary | ICD-10-CM | POA: Diagnosis not present

## 2022-03-05 DIAGNOSIS — D631 Anemia in chronic kidney disease: Secondary | ICD-10-CM | POA: Diagnosis not present

## 2022-03-05 DIAGNOSIS — N186 End stage renal disease: Secondary | ICD-10-CM | POA: Diagnosis not present

## 2022-03-05 DIAGNOSIS — D509 Iron deficiency anemia, unspecified: Secondary | ICD-10-CM | POA: Diagnosis not present

## 2022-03-05 DIAGNOSIS — Z992 Dependence on renal dialysis: Secondary | ICD-10-CM | POA: Diagnosis not present

## 2022-03-07 DIAGNOSIS — N186 End stage renal disease: Secondary | ICD-10-CM | POA: Diagnosis not present

## 2022-03-07 DIAGNOSIS — Z992 Dependence on renal dialysis: Secondary | ICD-10-CM | POA: Diagnosis not present

## 2022-03-08 DIAGNOSIS — Z992 Dependence on renal dialysis: Secondary | ICD-10-CM | POA: Diagnosis not present

## 2022-03-08 DIAGNOSIS — N2581 Secondary hyperparathyroidism of renal origin: Secondary | ICD-10-CM | POA: Diagnosis not present

## 2022-03-08 DIAGNOSIS — Z23 Encounter for immunization: Secondary | ICD-10-CM | POA: Diagnosis not present

## 2022-03-08 DIAGNOSIS — N186 End stage renal disease: Secondary | ICD-10-CM | POA: Diagnosis not present

## 2022-03-08 DIAGNOSIS — N250 Renal osteodystrophy: Secondary | ICD-10-CM | POA: Diagnosis not present

## 2022-03-08 DIAGNOSIS — D509 Iron deficiency anemia, unspecified: Secondary | ICD-10-CM | POA: Diagnosis not present

## 2022-03-08 DIAGNOSIS — E559 Vitamin D deficiency, unspecified: Secondary | ICD-10-CM | POA: Diagnosis not present

## 2022-03-08 DIAGNOSIS — D631 Anemia in chronic kidney disease: Secondary | ICD-10-CM | POA: Diagnosis not present

## 2022-03-10 DIAGNOSIS — N2581 Secondary hyperparathyroidism of renal origin: Secondary | ICD-10-CM | POA: Diagnosis not present

## 2022-03-10 DIAGNOSIS — D509 Iron deficiency anemia, unspecified: Secondary | ICD-10-CM | POA: Diagnosis not present

## 2022-03-10 DIAGNOSIS — Z992 Dependence on renal dialysis: Secondary | ICD-10-CM | POA: Diagnosis not present

## 2022-03-10 DIAGNOSIS — Z23 Encounter for immunization: Secondary | ICD-10-CM | POA: Diagnosis not present

## 2022-03-10 DIAGNOSIS — N186 End stage renal disease: Secondary | ICD-10-CM | POA: Diagnosis not present

## 2022-03-10 DIAGNOSIS — N250 Renal osteodystrophy: Secondary | ICD-10-CM | POA: Diagnosis not present

## 2022-03-12 DIAGNOSIS — N186 End stage renal disease: Secondary | ICD-10-CM | POA: Diagnosis not present

## 2022-03-12 DIAGNOSIS — Z992 Dependence on renal dialysis: Secondary | ICD-10-CM | POA: Diagnosis not present

## 2022-03-12 DIAGNOSIS — Z23 Encounter for immunization: Secondary | ICD-10-CM | POA: Diagnosis not present

## 2022-03-12 DIAGNOSIS — D509 Iron deficiency anemia, unspecified: Secondary | ICD-10-CM | POA: Diagnosis not present

## 2022-03-12 DIAGNOSIS — N2581 Secondary hyperparathyroidism of renal origin: Secondary | ICD-10-CM | POA: Diagnosis not present

## 2022-03-12 DIAGNOSIS — N250 Renal osteodystrophy: Secondary | ICD-10-CM | POA: Diagnosis not present

## 2022-03-15 DIAGNOSIS — D509 Iron deficiency anemia, unspecified: Secondary | ICD-10-CM | POA: Diagnosis not present

## 2022-03-15 DIAGNOSIS — N2581 Secondary hyperparathyroidism of renal origin: Secondary | ICD-10-CM | POA: Diagnosis not present

## 2022-03-15 DIAGNOSIS — Z23 Encounter for immunization: Secondary | ICD-10-CM | POA: Diagnosis not present

## 2022-03-15 DIAGNOSIS — N186 End stage renal disease: Secondary | ICD-10-CM | POA: Diagnosis not present

## 2022-03-15 DIAGNOSIS — Z992 Dependence on renal dialysis: Secondary | ICD-10-CM | POA: Diagnosis not present

## 2022-03-15 DIAGNOSIS — N250 Renal osteodystrophy: Secondary | ICD-10-CM | POA: Diagnosis not present

## 2022-03-17 DIAGNOSIS — Z992 Dependence on renal dialysis: Secondary | ICD-10-CM | POA: Diagnosis not present

## 2022-03-17 DIAGNOSIS — N2581 Secondary hyperparathyroidism of renal origin: Secondary | ICD-10-CM | POA: Diagnosis not present

## 2022-03-17 DIAGNOSIS — N250 Renal osteodystrophy: Secondary | ICD-10-CM | POA: Diagnosis not present

## 2022-03-17 DIAGNOSIS — Z23 Encounter for immunization: Secondary | ICD-10-CM | POA: Diagnosis not present

## 2022-03-17 DIAGNOSIS — D509 Iron deficiency anemia, unspecified: Secondary | ICD-10-CM | POA: Diagnosis not present

## 2022-03-17 DIAGNOSIS — N186 End stage renal disease: Secondary | ICD-10-CM | POA: Diagnosis not present

## 2022-03-19 DIAGNOSIS — N250 Renal osteodystrophy: Secondary | ICD-10-CM | POA: Diagnosis not present

## 2022-03-19 DIAGNOSIS — Z992 Dependence on renal dialysis: Secondary | ICD-10-CM | POA: Diagnosis not present

## 2022-03-19 DIAGNOSIS — Z23 Encounter for immunization: Secondary | ICD-10-CM | POA: Diagnosis not present

## 2022-03-19 DIAGNOSIS — D509 Iron deficiency anemia, unspecified: Secondary | ICD-10-CM | POA: Diagnosis not present

## 2022-03-19 DIAGNOSIS — N186 End stage renal disease: Secondary | ICD-10-CM | POA: Diagnosis not present

## 2022-03-19 DIAGNOSIS — N2581 Secondary hyperparathyroidism of renal origin: Secondary | ICD-10-CM | POA: Diagnosis not present

## 2022-03-22 DIAGNOSIS — Z992 Dependence on renal dialysis: Secondary | ICD-10-CM | POA: Diagnosis not present

## 2022-03-22 DIAGNOSIS — N2581 Secondary hyperparathyroidism of renal origin: Secondary | ICD-10-CM | POA: Diagnosis not present

## 2022-03-22 DIAGNOSIS — D509 Iron deficiency anemia, unspecified: Secondary | ICD-10-CM | POA: Diagnosis not present

## 2022-03-22 DIAGNOSIS — N186 End stage renal disease: Secondary | ICD-10-CM | POA: Diagnosis not present

## 2022-03-22 DIAGNOSIS — Z23 Encounter for immunization: Secondary | ICD-10-CM | POA: Diagnosis not present

## 2022-03-22 DIAGNOSIS — N250 Renal osteodystrophy: Secondary | ICD-10-CM | POA: Diagnosis not present

## 2022-03-24 DIAGNOSIS — Z992 Dependence on renal dialysis: Secondary | ICD-10-CM | POA: Diagnosis not present

## 2022-03-24 DIAGNOSIS — N2581 Secondary hyperparathyroidism of renal origin: Secondary | ICD-10-CM | POA: Diagnosis not present

## 2022-03-24 DIAGNOSIS — D509 Iron deficiency anemia, unspecified: Secondary | ICD-10-CM | POA: Diagnosis not present

## 2022-03-24 DIAGNOSIS — Z23 Encounter for immunization: Secondary | ICD-10-CM | POA: Diagnosis not present

## 2022-03-24 DIAGNOSIS — N186 End stage renal disease: Secondary | ICD-10-CM | POA: Diagnosis not present

## 2022-03-24 DIAGNOSIS — N250 Renal osteodystrophy: Secondary | ICD-10-CM | POA: Diagnosis not present

## 2022-03-26 DIAGNOSIS — D509 Iron deficiency anemia, unspecified: Secondary | ICD-10-CM | POA: Diagnosis not present

## 2022-03-26 DIAGNOSIS — Z23 Encounter for immunization: Secondary | ICD-10-CM | POA: Diagnosis not present

## 2022-03-26 DIAGNOSIS — N250 Renal osteodystrophy: Secondary | ICD-10-CM | POA: Diagnosis not present

## 2022-03-26 DIAGNOSIS — N2581 Secondary hyperparathyroidism of renal origin: Secondary | ICD-10-CM | POA: Diagnosis not present

## 2022-03-26 DIAGNOSIS — Z992 Dependence on renal dialysis: Secondary | ICD-10-CM | POA: Diagnosis not present

## 2022-03-26 DIAGNOSIS — N186 End stage renal disease: Secondary | ICD-10-CM | POA: Diagnosis not present

## 2022-03-29 DIAGNOSIS — N186 End stage renal disease: Secondary | ICD-10-CM | POA: Diagnosis not present

## 2022-03-29 DIAGNOSIS — N250 Renal osteodystrophy: Secondary | ICD-10-CM | POA: Diagnosis not present

## 2022-03-29 DIAGNOSIS — D509 Iron deficiency anemia, unspecified: Secondary | ICD-10-CM | POA: Diagnosis not present

## 2022-03-29 DIAGNOSIS — Z992 Dependence on renal dialysis: Secondary | ICD-10-CM | POA: Diagnosis not present

## 2022-03-29 DIAGNOSIS — Z23 Encounter for immunization: Secondary | ICD-10-CM | POA: Diagnosis not present

## 2022-03-29 DIAGNOSIS — N2581 Secondary hyperparathyroidism of renal origin: Secondary | ICD-10-CM | POA: Diagnosis not present

## 2022-03-31 DIAGNOSIS — Z992 Dependence on renal dialysis: Secondary | ICD-10-CM | POA: Diagnosis not present

## 2022-03-31 DIAGNOSIS — D509 Iron deficiency anemia, unspecified: Secondary | ICD-10-CM | POA: Diagnosis not present

## 2022-03-31 DIAGNOSIS — N186 End stage renal disease: Secondary | ICD-10-CM | POA: Diagnosis not present

## 2022-03-31 DIAGNOSIS — Z23 Encounter for immunization: Secondary | ICD-10-CM | POA: Diagnosis not present

## 2022-03-31 DIAGNOSIS — N250 Renal osteodystrophy: Secondary | ICD-10-CM | POA: Diagnosis not present

## 2022-03-31 DIAGNOSIS — N2581 Secondary hyperparathyroidism of renal origin: Secondary | ICD-10-CM | POA: Diagnosis not present

## 2022-04-02 DIAGNOSIS — N250 Renal osteodystrophy: Secondary | ICD-10-CM | POA: Diagnosis not present

## 2022-04-02 DIAGNOSIS — N2581 Secondary hyperparathyroidism of renal origin: Secondary | ICD-10-CM | POA: Diagnosis not present

## 2022-04-02 DIAGNOSIS — D509 Iron deficiency anemia, unspecified: Secondary | ICD-10-CM | POA: Diagnosis not present

## 2022-04-02 DIAGNOSIS — Z23 Encounter for immunization: Secondary | ICD-10-CM | POA: Diagnosis not present

## 2022-04-02 DIAGNOSIS — Z992 Dependence on renal dialysis: Secondary | ICD-10-CM | POA: Diagnosis not present

## 2022-04-02 DIAGNOSIS — N186 End stage renal disease: Secondary | ICD-10-CM | POA: Diagnosis not present

## 2022-04-05 DIAGNOSIS — N186 End stage renal disease: Secondary | ICD-10-CM | POA: Diagnosis not present

## 2022-04-05 DIAGNOSIS — N250 Renal osteodystrophy: Secondary | ICD-10-CM | POA: Diagnosis not present

## 2022-04-05 DIAGNOSIS — Z23 Encounter for immunization: Secondary | ICD-10-CM | POA: Diagnosis not present

## 2022-04-05 DIAGNOSIS — Z992 Dependence on renal dialysis: Secondary | ICD-10-CM | POA: Diagnosis not present

## 2022-04-05 DIAGNOSIS — D509 Iron deficiency anemia, unspecified: Secondary | ICD-10-CM | POA: Diagnosis not present

## 2022-04-05 DIAGNOSIS — N2581 Secondary hyperparathyroidism of renal origin: Secondary | ICD-10-CM | POA: Diagnosis not present

## 2022-04-07 DIAGNOSIS — N250 Renal osteodystrophy: Secondary | ICD-10-CM | POA: Diagnosis not present

## 2022-04-07 DIAGNOSIS — N2581 Secondary hyperparathyroidism of renal origin: Secondary | ICD-10-CM | POA: Diagnosis not present

## 2022-04-07 DIAGNOSIS — N186 End stage renal disease: Secondary | ICD-10-CM | POA: Diagnosis not present

## 2022-04-07 DIAGNOSIS — Z992 Dependence on renal dialysis: Secondary | ICD-10-CM | POA: Diagnosis not present

## 2022-04-07 DIAGNOSIS — Z23 Encounter for immunization: Secondary | ICD-10-CM | POA: Diagnosis not present

## 2022-04-07 DIAGNOSIS — D509 Iron deficiency anemia, unspecified: Secondary | ICD-10-CM | POA: Diagnosis not present

## 2022-04-09 DIAGNOSIS — D631 Anemia in chronic kidney disease: Secondary | ICD-10-CM | POA: Diagnosis not present

## 2022-04-09 DIAGNOSIS — D509 Iron deficiency anemia, unspecified: Secondary | ICD-10-CM | POA: Diagnosis not present

## 2022-04-09 DIAGNOSIS — N250 Renal osteodystrophy: Secondary | ICD-10-CM | POA: Diagnosis not present

## 2022-04-09 DIAGNOSIS — N2581 Secondary hyperparathyroidism of renal origin: Secondary | ICD-10-CM | POA: Diagnosis not present

## 2022-04-09 DIAGNOSIS — Z992 Dependence on renal dialysis: Secondary | ICD-10-CM | POA: Diagnosis not present

## 2022-04-09 DIAGNOSIS — N186 End stage renal disease: Secondary | ICD-10-CM | POA: Diagnosis not present

## 2022-04-12 DIAGNOSIS — D631 Anemia in chronic kidney disease: Secondary | ICD-10-CM | POA: Diagnosis not present

## 2022-04-12 DIAGNOSIS — D509 Iron deficiency anemia, unspecified: Secondary | ICD-10-CM | POA: Diagnosis not present

## 2022-04-12 DIAGNOSIS — Z992 Dependence on renal dialysis: Secondary | ICD-10-CM | POA: Diagnosis not present

## 2022-04-12 DIAGNOSIS — N2581 Secondary hyperparathyroidism of renal origin: Secondary | ICD-10-CM | POA: Diagnosis not present

## 2022-04-12 DIAGNOSIS — N186 End stage renal disease: Secondary | ICD-10-CM | POA: Diagnosis not present

## 2022-04-13 DIAGNOSIS — L110 Acquired keratosis follicularis: Secondary | ICD-10-CM | POA: Diagnosis not present

## 2022-04-13 DIAGNOSIS — E1151 Type 2 diabetes mellitus with diabetic peripheral angiopathy without gangrene: Secondary | ICD-10-CM | POA: Diagnosis not present

## 2022-04-13 DIAGNOSIS — Z96653 Presence of artificial knee joint, bilateral: Secondary | ICD-10-CM | POA: Diagnosis not present

## 2022-04-13 DIAGNOSIS — B351 Tinea unguium: Secondary | ICD-10-CM | POA: Diagnosis not present

## 2022-04-13 DIAGNOSIS — E1140 Type 2 diabetes mellitus with diabetic neuropathy, unspecified: Secondary | ICD-10-CM | POA: Diagnosis not present

## 2022-04-14 DIAGNOSIS — Z992 Dependence on renal dialysis: Secondary | ICD-10-CM | POA: Diagnosis not present

## 2022-04-14 DIAGNOSIS — D631 Anemia in chronic kidney disease: Secondary | ICD-10-CM | POA: Diagnosis not present

## 2022-04-14 DIAGNOSIS — N2581 Secondary hyperparathyroidism of renal origin: Secondary | ICD-10-CM | POA: Diagnosis not present

## 2022-04-14 DIAGNOSIS — D509 Iron deficiency anemia, unspecified: Secondary | ICD-10-CM | POA: Diagnosis not present

## 2022-04-14 DIAGNOSIS — N186 End stage renal disease: Secondary | ICD-10-CM | POA: Diagnosis not present

## 2022-04-16 DIAGNOSIS — Z992 Dependence on renal dialysis: Secondary | ICD-10-CM | POA: Diagnosis not present

## 2022-04-16 DIAGNOSIS — D509 Iron deficiency anemia, unspecified: Secondary | ICD-10-CM | POA: Diagnosis not present

## 2022-04-16 DIAGNOSIS — N2581 Secondary hyperparathyroidism of renal origin: Secondary | ICD-10-CM | POA: Diagnosis not present

## 2022-04-16 DIAGNOSIS — N186 End stage renal disease: Secondary | ICD-10-CM | POA: Diagnosis not present

## 2022-04-16 DIAGNOSIS — D631 Anemia in chronic kidney disease: Secondary | ICD-10-CM | POA: Diagnosis not present

## 2022-04-19 DIAGNOSIS — N2581 Secondary hyperparathyroidism of renal origin: Secondary | ICD-10-CM | POA: Diagnosis not present

## 2022-04-19 DIAGNOSIS — D631 Anemia in chronic kidney disease: Secondary | ICD-10-CM | POA: Diagnosis not present

## 2022-04-19 DIAGNOSIS — N186 End stage renal disease: Secondary | ICD-10-CM | POA: Diagnosis not present

## 2022-04-19 DIAGNOSIS — Z992 Dependence on renal dialysis: Secondary | ICD-10-CM | POA: Diagnosis not present

## 2022-04-19 DIAGNOSIS — D509 Iron deficiency anemia, unspecified: Secondary | ICD-10-CM | POA: Diagnosis not present

## 2022-04-21 DIAGNOSIS — D631 Anemia in chronic kidney disease: Secondary | ICD-10-CM | POA: Diagnosis not present

## 2022-04-21 DIAGNOSIS — N186 End stage renal disease: Secondary | ICD-10-CM | POA: Diagnosis not present

## 2022-04-21 DIAGNOSIS — D509 Iron deficiency anemia, unspecified: Secondary | ICD-10-CM | POA: Diagnosis not present

## 2022-04-21 DIAGNOSIS — T82868A Thrombosis of vascular prosthetic devices, implants and grafts, initial encounter: Secondary | ICD-10-CM | POA: Diagnosis not present

## 2022-04-21 DIAGNOSIS — Z992 Dependence on renal dialysis: Secondary | ICD-10-CM | POA: Diagnosis not present

## 2022-04-21 DIAGNOSIS — N2581 Secondary hyperparathyroidism of renal origin: Secondary | ICD-10-CM | POA: Diagnosis not present

## 2022-04-21 DIAGNOSIS — I871 Compression of vein: Secondary | ICD-10-CM | POA: Diagnosis not present

## 2022-04-23 DIAGNOSIS — N2581 Secondary hyperparathyroidism of renal origin: Secondary | ICD-10-CM | POA: Diagnosis not present

## 2022-04-23 DIAGNOSIS — D509 Iron deficiency anemia, unspecified: Secondary | ICD-10-CM | POA: Diagnosis not present

## 2022-04-23 DIAGNOSIS — D631 Anemia in chronic kidney disease: Secondary | ICD-10-CM | POA: Diagnosis not present

## 2022-04-23 DIAGNOSIS — N186 End stage renal disease: Secondary | ICD-10-CM | POA: Diagnosis not present

## 2022-04-23 DIAGNOSIS — Z992 Dependence on renal dialysis: Secondary | ICD-10-CM | POA: Diagnosis not present

## 2022-04-26 DIAGNOSIS — N2581 Secondary hyperparathyroidism of renal origin: Secondary | ICD-10-CM | POA: Diagnosis not present

## 2022-04-26 DIAGNOSIS — N186 End stage renal disease: Secondary | ICD-10-CM | POA: Diagnosis not present

## 2022-04-26 DIAGNOSIS — Z992 Dependence on renal dialysis: Secondary | ICD-10-CM | POA: Diagnosis not present

## 2022-04-26 DIAGNOSIS — D509 Iron deficiency anemia, unspecified: Secondary | ICD-10-CM | POA: Diagnosis not present

## 2022-04-26 DIAGNOSIS — T82858A Stenosis of vascular prosthetic devices, implants and grafts, initial encounter: Secondary | ICD-10-CM | POA: Diagnosis not present

## 2022-04-26 DIAGNOSIS — D631 Anemia in chronic kidney disease: Secondary | ICD-10-CM | POA: Diagnosis not present

## 2022-04-26 DIAGNOSIS — T82868A Thrombosis of vascular prosthetic devices, implants and grafts, initial encounter: Secondary | ICD-10-CM | POA: Diagnosis not present

## 2022-04-27 DIAGNOSIS — J441 Chronic obstructive pulmonary disease with (acute) exacerbation: Secondary | ICD-10-CM | POA: Diagnosis not present

## 2022-04-27 DIAGNOSIS — Z992 Dependence on renal dialysis: Secondary | ICD-10-CM | POA: Diagnosis not present

## 2022-04-27 DIAGNOSIS — E1165 Type 2 diabetes mellitus with hyperglycemia: Secondary | ICD-10-CM | POA: Diagnosis not present

## 2022-04-27 DIAGNOSIS — Z299 Encounter for prophylactic measures, unspecified: Secondary | ICD-10-CM | POA: Diagnosis not present

## 2022-04-27 DIAGNOSIS — Z6828 Body mass index (BMI) 28.0-28.9, adult: Secondary | ICD-10-CM | POA: Diagnosis not present

## 2022-04-27 DIAGNOSIS — I10 Essential (primary) hypertension: Secondary | ICD-10-CM | POA: Diagnosis not present

## 2022-04-27 DIAGNOSIS — N2581 Secondary hyperparathyroidism of renal origin: Secondary | ICD-10-CM | POA: Diagnosis not present

## 2022-04-28 DIAGNOSIS — D509 Iron deficiency anemia, unspecified: Secondary | ICD-10-CM | POA: Diagnosis not present

## 2022-04-28 DIAGNOSIS — N186 End stage renal disease: Secondary | ICD-10-CM | POA: Diagnosis not present

## 2022-04-28 DIAGNOSIS — Z992 Dependence on renal dialysis: Secondary | ICD-10-CM | POA: Diagnosis not present

## 2022-04-28 DIAGNOSIS — N2581 Secondary hyperparathyroidism of renal origin: Secondary | ICD-10-CM | POA: Diagnosis not present

## 2022-04-28 DIAGNOSIS — D631 Anemia in chronic kidney disease: Secondary | ICD-10-CM | POA: Diagnosis not present

## 2022-04-30 DIAGNOSIS — T82868A Thrombosis of vascular prosthetic devices, implants and grafts, initial encounter: Secondary | ICD-10-CM | POA: Diagnosis not present

## 2022-04-30 DIAGNOSIS — Z992 Dependence on renal dialysis: Secondary | ICD-10-CM | POA: Diagnosis not present

## 2022-04-30 DIAGNOSIS — N186 End stage renal disease: Secondary | ICD-10-CM | POA: Diagnosis not present

## 2022-05-01 DIAGNOSIS — D631 Anemia in chronic kidney disease: Secondary | ICD-10-CM | POA: Diagnosis not present

## 2022-05-01 DIAGNOSIS — N186 End stage renal disease: Secondary | ICD-10-CM | POA: Diagnosis not present

## 2022-05-01 DIAGNOSIS — Z992 Dependence on renal dialysis: Secondary | ICD-10-CM | POA: Diagnosis not present

## 2022-05-01 DIAGNOSIS — N2581 Secondary hyperparathyroidism of renal origin: Secondary | ICD-10-CM | POA: Diagnosis not present

## 2022-05-01 DIAGNOSIS — D509 Iron deficiency anemia, unspecified: Secondary | ICD-10-CM | POA: Diagnosis not present

## 2022-05-03 DIAGNOSIS — Z992 Dependence on renal dialysis: Secondary | ICD-10-CM | POA: Diagnosis not present

## 2022-05-03 DIAGNOSIS — N186 End stage renal disease: Secondary | ICD-10-CM | POA: Diagnosis not present

## 2022-05-03 DIAGNOSIS — D631 Anemia in chronic kidney disease: Secondary | ICD-10-CM | POA: Diagnosis not present

## 2022-05-03 DIAGNOSIS — N2581 Secondary hyperparathyroidism of renal origin: Secondary | ICD-10-CM | POA: Diagnosis not present

## 2022-05-03 DIAGNOSIS — D509 Iron deficiency anemia, unspecified: Secondary | ICD-10-CM | POA: Diagnosis not present

## 2022-05-05 DIAGNOSIS — N2581 Secondary hyperparathyroidism of renal origin: Secondary | ICD-10-CM | POA: Diagnosis not present

## 2022-05-05 DIAGNOSIS — D509 Iron deficiency anemia, unspecified: Secondary | ICD-10-CM | POA: Diagnosis not present

## 2022-05-05 DIAGNOSIS — N186 End stage renal disease: Secondary | ICD-10-CM | POA: Diagnosis not present

## 2022-05-05 DIAGNOSIS — Z992 Dependence on renal dialysis: Secondary | ICD-10-CM | POA: Diagnosis not present

## 2022-05-05 DIAGNOSIS — D631 Anemia in chronic kidney disease: Secondary | ICD-10-CM | POA: Diagnosis not present

## 2022-05-07 DIAGNOSIS — M25511 Pain in right shoulder: Secondary | ICD-10-CM | POA: Diagnosis not present

## 2022-05-07 DIAGNOSIS — N2581 Secondary hyperparathyroidism of renal origin: Secondary | ICD-10-CM | POA: Diagnosis not present

## 2022-05-07 DIAGNOSIS — N186 End stage renal disease: Secondary | ICD-10-CM | POA: Diagnosis not present

## 2022-05-07 DIAGNOSIS — M25512 Pain in left shoulder: Secondary | ICD-10-CM | POA: Diagnosis not present

## 2022-05-07 DIAGNOSIS — D631 Anemia in chronic kidney disease: Secondary | ICD-10-CM | POA: Diagnosis not present

## 2022-05-07 DIAGNOSIS — D509 Iron deficiency anemia, unspecified: Secondary | ICD-10-CM | POA: Diagnosis not present

## 2022-05-07 DIAGNOSIS — Z992 Dependence on renal dialysis: Secondary | ICD-10-CM | POA: Diagnosis not present

## 2022-05-10 DIAGNOSIS — Z992 Dependence on renal dialysis: Secondary | ICD-10-CM | POA: Diagnosis not present

## 2022-05-10 DIAGNOSIS — N186 End stage renal disease: Secondary | ICD-10-CM | POA: Diagnosis not present

## 2022-05-10 DIAGNOSIS — N2581 Secondary hyperparathyroidism of renal origin: Secondary | ICD-10-CM | POA: Diagnosis not present

## 2022-05-10 DIAGNOSIS — D631 Anemia in chronic kidney disease: Secondary | ICD-10-CM | POA: Diagnosis not present

## 2022-05-10 DIAGNOSIS — D509 Iron deficiency anemia, unspecified: Secondary | ICD-10-CM | POA: Diagnosis not present

## 2022-05-10 DIAGNOSIS — N250 Renal osteodystrophy: Secondary | ICD-10-CM | POA: Diagnosis not present

## 2022-05-12 ENCOUNTER — Encounter: Payer: Self-pay | Admitting: Vascular Surgery

## 2022-05-12 ENCOUNTER — Ambulatory Visit (INDEPENDENT_AMBULATORY_CARE_PROVIDER_SITE_OTHER): Payer: Medicare Other | Admitting: Vascular Surgery

## 2022-05-12 VITALS — BP 130/74 | HR 74 | Temp 98.9°F | Ht 74.0 in | Wt 221.8 lb

## 2022-05-12 DIAGNOSIS — D509 Iron deficiency anemia, unspecified: Secondary | ICD-10-CM | POA: Diagnosis not present

## 2022-05-12 DIAGNOSIS — N2581 Secondary hyperparathyroidism of renal origin: Secondary | ICD-10-CM | POA: Diagnosis not present

## 2022-05-12 DIAGNOSIS — N186 End stage renal disease: Secondary | ICD-10-CM | POA: Diagnosis not present

## 2022-05-12 DIAGNOSIS — N250 Renal osteodystrophy: Secondary | ICD-10-CM | POA: Diagnosis not present

## 2022-05-12 DIAGNOSIS — D631 Anemia in chronic kidney disease: Secondary | ICD-10-CM | POA: Diagnosis not present

## 2022-05-12 DIAGNOSIS — Z992 Dependence on renal dialysis: Secondary | ICD-10-CM | POA: Diagnosis not present

## 2022-05-12 NOTE — Progress Notes (Signed)
130/74  

## 2022-05-12 NOTE — Progress Notes (Signed)
V[facility]ist of [place]  Patient name: Todd Robertson MRN: [id number] DOB: Apr 23, 1946 Sex: male  REASON FOR VISIT: Discussed new access for hemodialysis  HPI: Todd Robertson is a 76 y.o. male here today for discussion of access for hemodialysis.  He is here today with his daughter and his wife.  He currently undergoes dialysis at [facility] on Monday Wednesday and Friday.  He was using a left forearm loop graft.  This was placed by myself while he was an inpatient at [facility] in July 2021.  He reports that he has recently had several episodes of occlusion that was unsuccessfully treated [facility] with several declots.  He is here today for new access placement.  He also had a tunneled catheter placed at [facility] 2 weeks ago.  He does not have a pacemaker.  He is not on anticoagulation.  I do not have his images but do not have any indication that there was subclavian vein treatment.  Past Medical History:  Diagnosis Date   Arthritis    Cancer ([place])    prostate   Cardiomyopathy    secondary   CKD (chronic kidney disease)    DM2 (diabetes mellitus, type 2) (HCC)    Gout    HTN (hypertension)    unspec   Hypercholesterolemia    Nonischemic cardiomyopathy (HCC)    a. EF 40-50% by most recent 2-D echo b. EF 25% by cardiac cath in 2004   Overweight(278.02)     Family History  Problem Relation Age of Onset   Hypertension Mother    Kidney disease Mother    Heart disease Father    Kidney disease Other     SOCIAL HISTORY: Social History   Tobacco Use   Smoking status: Former    Packs/day: 0.50    Years: 20.00    Total pack years: 10.00    Types: Cigarettes    Start date: 02/12/1966    Quit date: 11/08/1984    Years since quitting: 37.5   Smokeless tobacco: Never  Substance Use Topics   Alcohol use: No    Alcohol/week: 0.0 standard drinks of alcohol    Allergies  Allergen Reactions   No Known  Allergies     Current Outpatient Medications  Medication Sig Dispense Refill   acetaminophen (TYLENOL) 325 MG tablet Take 1-2 tablets (325-650 mg total) by mouth every 4 (four) hours as needed for mild pain.     albuterol (VENTOLIN HFA) 108 (90 Base) MCG/ACT inhaler Inhale 1-2 puffs into the lungs every 6 (six) hours as needed for wheezing or shortness of breath. 6.7 g 1   allopurinol (ZYLOPRIM) 300 MG tablet Take 300 mg by mouth daily.     atorvastatin (LIPITOR) 40 MG tablet Take 1 tablet (40 mg total) by mouth daily. 90 tablet 3   calcium acetate (PHOSLO) 667 MG capsule Take 3 capsules (2,001 mg total) by mouth 3 (three) times daily with meals. 180 capsule 1   cetirizine (ZYRTEC) 10 MG tablet Take by mouth.     Cholecalciferol (VITAMIN D) 50 MCG (2000 UT) tablet Take 1 tablet (2,000 Units total) by mouth daily. 30 tablet 0   metoprolol tartrate (LOPRESSOR) 25 MG tablet Take 0.5 tablets (12.5 mg total) by mouth 2 (two) times daily. 90 tablet 3   midodrine (PROAMATINE) 10 MG tablet TAKE 1 TABLET BY MOUTH THREE TIMES DAILY WITH MEALS 270 tablet 2   Multiple Vitamins-Minerals (CENTRUM SILVER 50+MEN) TABS  Take 1 tablet by mouth daily.     multivitamin (RENA-VIT) TABS tablet Take 1 tablet by mouth at bedtime. 30 tablet 0   Polyethylene Glycol 3350 (MIRALAX PO) Take by mouth as needed.     senna (SENOKOT) 8.6 MG tablet Take 1 tablet by mouth daily as needed for constipation.      tamsulosin (FLOMAX) 0.4 MG CAPS capsule Take 1 capsule (0.4 mg total) by mouth daily. 90 capsule 3   budesonide-formoterol (SYMBICORT) 160-4.5 MCG/ACT inhaler Inhale 2 puffs into the lungs 2 (two) times daily. (Patient not taking: Reported on 05/12/2022) 1 each 12   econazole nitrate 1 % cream Apply topically 2 (two) times daily. (Patient not taking: Reported on 09/14/2021)     insulin lispro (HUMALOG) 100 UNIT/ML KwikPen Inject into the skin. (Patient not taking: Reported on 05/12/2022)     linaclotide (LINZESS) 290 MCG CAPS  capsule Take by mouth. (Patient not taking: Reported on 09/14/2021)     loratadine (CLARITIN) 10 MG tablet Take 10 mg by mouth daily. (Patient not taking: Reported on 09/14/2021)     No current facility-administered medications for this visit.    REVIEW OF SYSTEMS:  '[X]'$  denotes positive finding, '[ ]'$  denotes negative finding Cardiac  Comments:  Chest pain or chest pressure:    Shortness of breath upon exertion:    Short of breath when lying flat:    Irregular heart rhythm:        Vascular    Pain in calf, thigh, or hip brought on by ambulation:    Pain in feet at night that wakes you up from your sleep:     Blood clot in your veins:    Leg swelling:           PHYSICAL EXAM: Vitals:   05/12/22 1513  BP: 130/74  Pulse: 74  Temp: 98.9 F (37.2 C)  TempSrc: Temporal  SpO2: 97%  Weight: 221 lb 12.8 oz (100.6 kg)  Height: '6\' 2"'$  (1.88 m)    GENERAL: The patient is a well-nourished male, in no acute distress. The vital signs are documented above. CARDIOVASCULAR: 2+ radial pulses bilaterally.  Left forearm loop Gore-Tex graft with no thrill or bruit. PULMONARY: There is good air exchange  MUSCULOSKELETAL: There are no major deformities or cyanosis. NEUROLOGIC: No focal weakness or paresthesias are detected. SKIN: There are no ulcers or rashes noted. PSYCHIATRIC: The patient has a normal affect.  DATA:  I imaged the veins of both arms with SonoSite ultrasound.  He has very small cephalic and basilic veins bilaterally.  MEDICAL ISSUES: Had long discussion with the patient and his family discussed bribing options for hemodialysis.  I do not feel that he is a fistula candidate.  I have recommended a left upper arm AV Gore-Tex graft.  He request that this be tunneled somewhat more laterally.  He reports pain with external rotation of his arm for dialysis access.  We will obtain notes from [facility] to assure that he does not have any subclavian vein occlusion prior to proceeding  with left arm access.  If he does have left subclavian occlusion, would place right arm access.  Surgery is planned for 06/08/2022 at [facility]    [name], MD [facility] Vascular and Vein Specialists of [facility] Tel [phone]  Note: Portions of this report may have been transcribed using voice recognition software.  Every effort has been made to ensure accuracy; however, inadvertent computerized transcription errors may  still be present.

## 2022-05-12 NOTE — H&P (View-Only) (Signed)
V[facility] and Vein Specialist of [place]  Patient name: Todd Robertson MRN: [id number] DOB: [birth year] Sex: male  REASON FOR VISIT: Discussed new access for hemodialysis  HPI: Todd Robertson is a 76 y.o. male here today for discussion of access for hemodialysis.  He is here today with his daughter and his wife.  He currently undergoes dialysis at [facility] on Monday Wednesday and Friday.  He was using a left forearm loop graft.  This was placed by myself while he was an inpatient at [facility] in July 2021.  He reports that he has recently had several episodes of occlusion that was unsuccessfully treated [facility] with several declots.  He is here today for new access placement.  He also had a tunneled catheter placed at [facility] 2 weeks ago.  He does not have a pacemaker.  He is not on anticoagulation.  I do not have his images but do not have any indication that there was subclavian vein treatment.  Past Medical History:  Diagnosis Date   Arthritis    Cancer ([place])    prostate   Cardiomyopathy    secondary   CKD (chronic kidney disease)    DM2 (diabetes mellitus, type 2) (HCC)    Gout    HTN (hypertension)    unspec   Hypercholesterolemia    Nonischemic cardiomyopathy (HCC)    a. EF 40-50% by most recent 2-D echo b. EF 25% by cardiac cath in 2004   Overweight(278.02)     Family History  Problem Relation Age of Onset   Hypertension Mother    Kidney disease Mother    Heart disease Father    Kidney disease Other     SOCIAL HISTORY: Social History   Tobacco Use   Smoking status: Former    Packs/day: 0.50    Years: 20.00    Total pack years: 10.00    Types: Cigarettes    Start date: 02/12/1966    Quit date: 11/08/1984    Years since quitting: 37.5   Smokeless tobacco: Never  Substance Use Topics   Alcohol use: No    Alcohol/week: 0.0 standard drinks of alcohol    Allergies  Allergen Reactions   No Known  Allergies     Current Outpatient Medications  Medication Sig Dispense Refill   acetaminophen (TYLENOL) 325 MG tablet Take 1-2 tablets (325-650 mg total) by mouth every 4 (four) hours as needed for mild pain.     albuterol (VENTOLIN HFA) 108 (90 Base) MCG/ACT inhaler Inhale 1-2 puffs into the lungs every 6 (six) hours as needed for wheezing or shortness of breath. 6.7 g 1   allopurinol (ZYLOPRIM) 300 MG tablet Take 300 mg by mouth daily.     atorvastatin (LIPITOR) 40 MG tablet Take 1 tablet (40 mg total) by mouth daily. 90 tablet 3   calcium acetate (PHOSLO) 667 MG capsule Take 3 capsules (2,001 mg total) by mouth 3 (three) times daily with meals. 180 capsule 1   cetirizine (ZYRTEC) 10 MG tablet Take by mouth.     Cholecalciferol (VITAMIN D) 50 MCG (2000 UT) tablet Take 1 tablet (2,000 Units total) by mouth daily. 30 tablet 0   metoprolol tartrate (LOPRESSOR) 25 MG tablet Take 0.5 tablets (12.5 mg total) by mouth 2 (two) times daily. 90 tablet 3   midodrine (PROAMATINE) 10 MG tablet TAKE 1 TABLET BY MOUTH THREE TIMES DAILY WITH MEALS 270 tablet 2   Multiple Vitamins-Minerals (CENTRUM SILVER 50+MEN) TABS  Take 1 tablet by mouth daily.     multivitamin (RENA-VIT) TABS tablet Take 1 tablet by mouth at bedtime. 30 tablet 0   Polyethylene Glycol 3350 (MIRALAX PO) Take by mouth as needed.     senna (SENOKOT) 8.6 MG tablet Take 1 tablet by mouth daily as needed for constipation.      tamsulosin (FLOMAX) 0.4 MG CAPS capsule Take 1 capsule (0.4 mg total) by mouth daily. 90 capsule 3   budesonide-formoterol (SYMBICORT) 160-4.5 MCG/ACT inhaler Inhale 2 puffs into the lungs 2 (two) times daily. (Patient not taking: Reported on 05/12/2022) 1 each 12   econazole nitrate 1 % cream Apply topically 2 (two) times daily. (Patient not taking: Reported on 09/14/2021)     insulin lispro (HUMALOG) 100 UNIT/ML KwikPen Inject into the skin. (Patient not taking: Reported on 05/12/2022)     linaclotide (LINZESS) 290 MCG CAPS  capsule Take by mouth. (Patient not taking: Reported on 09/14/2021)     loratadine (CLARITIN) 10 MG tablet Take 10 mg by mouth daily. (Patient not taking: Reported on 09/14/2021)     No current facility-administered medications for this visit.    REVIEW OF SYSTEMS:  '[X]'$  denotes positive finding, '[ ]'$  denotes negative finding Cardiac  Comments:  Chest pain or chest pressure:    Shortness of breath upon exertion:    Short of breath when lying flat:    Irregular heart rhythm:        Vascular    Pain in calf, thigh, or hip brought on by ambulation:    Pain in feet at night that wakes you up from your sleep:     Blood clot in your veins:    Leg swelling:           PHYSICAL EXAM: Vitals:   05/12/22 1513  BP: 130/74  Pulse: 74  Temp: 98.9 F (37.2 C)  TempSrc: Temporal  SpO2: 97%  Weight: 221 lb 12.8 oz (100.6 kg)  Height: '6\' 2"'$  (1.88 m)    GENERAL: The patient is a well-nourished male, in no acute distress. The vital signs are documented above. CARDIOVASCULAR: 2+ radial pulses bilaterally.  Left forearm loop Gore-Tex graft with no thrill or bruit. PULMONARY: There is good air exchange  MUSCULOSKELETAL: There are no major deformities or cyanosis. NEUROLOGIC: No focal weakness or paresthesias are detected. SKIN: There are no ulcers or rashes noted. PSYCHIATRIC: The patient has a normal affect.  DATA:  I imaged the veins of both arms with SonoSite ultrasound.  He has very small cephalic and basilic veins bilaterally.  MEDICAL ISSUES: Had long discussion with the patient and his family discussed bribing options for hemodialysis.  I do not feel that he is a fistula candidate.  I have recommended a left upper arm AV Gore-Tex graft.  He request that this be tunneled somewhat more laterally.  He reports pain with external rotation of his arm for dialysis access.  We will obtain notes from [facility] to assure that he does not have any subclavian vein occlusion prior to proceeding  with left arm access.  If he does have left subclavian occlusion, would place right arm access.  Surgery is planned for 06/08/2022 at [facility]    [name], MD [facility] Vascular and Vein Specialists of [facility] Tel [phone]  Note: Portions of this report may have been transcribed using voice recognition software.  Every effort has been made to ensure accuracy; however, inadvertent computerized transcription errors may  still be present.

## 2022-05-13 ENCOUNTER — Other Ambulatory Visit: Payer: Self-pay

## 2022-05-13 DIAGNOSIS — N186 End stage renal disease: Secondary | ICD-10-CM

## 2022-05-14 DIAGNOSIS — D631 Anemia in chronic kidney disease: Secondary | ICD-10-CM | POA: Diagnosis not present

## 2022-05-14 DIAGNOSIS — N250 Renal osteodystrophy: Secondary | ICD-10-CM | POA: Diagnosis not present

## 2022-05-14 DIAGNOSIS — D509 Iron deficiency anemia, unspecified: Secondary | ICD-10-CM | POA: Diagnosis not present

## 2022-05-14 DIAGNOSIS — Z992 Dependence on renal dialysis: Secondary | ICD-10-CM | POA: Diagnosis not present

## 2022-05-14 DIAGNOSIS — N186 End stage renal disease: Secondary | ICD-10-CM | POA: Diagnosis not present

## 2022-05-14 DIAGNOSIS — N2581 Secondary hyperparathyroidism of renal origin: Secondary | ICD-10-CM | POA: Diagnosis not present

## 2022-05-17 DIAGNOSIS — N2581 Secondary hyperparathyroidism of renal origin: Secondary | ICD-10-CM | POA: Diagnosis not present

## 2022-05-17 DIAGNOSIS — D631 Anemia in chronic kidney disease: Secondary | ICD-10-CM | POA: Diagnosis not present

## 2022-05-17 DIAGNOSIS — D509 Iron deficiency anemia, unspecified: Secondary | ICD-10-CM | POA: Diagnosis not present

## 2022-05-17 DIAGNOSIS — Z992 Dependence on renal dialysis: Secondary | ICD-10-CM | POA: Diagnosis not present

## 2022-05-17 DIAGNOSIS — N250 Renal osteodystrophy: Secondary | ICD-10-CM | POA: Diagnosis not present

## 2022-05-17 DIAGNOSIS — N186 End stage renal disease: Secondary | ICD-10-CM | POA: Diagnosis not present

## 2022-05-19 ENCOUNTER — Ambulatory Visit (INDEPENDENT_AMBULATORY_CARE_PROVIDER_SITE_OTHER): Payer: Medicare Other | Admitting: Cardiology

## 2022-05-19 ENCOUNTER — Encounter: Payer: Self-pay | Admitting: Cardiology

## 2022-05-19 VITALS — BP 100/60 | HR 70 | Ht 74.0 in | Wt 222.6 lb

## 2022-05-19 DIAGNOSIS — D631 Anemia in chronic kidney disease: Secondary | ICD-10-CM | POA: Diagnosis not present

## 2022-05-19 DIAGNOSIS — I493 Ventricular premature depolarization: Secondary | ICD-10-CM | POA: Diagnosis not present

## 2022-05-19 DIAGNOSIS — D509 Iron deficiency anemia, unspecified: Secondary | ICD-10-CM | POA: Diagnosis not present

## 2022-05-19 DIAGNOSIS — N186 End stage renal disease: Secondary | ICD-10-CM | POA: Diagnosis not present

## 2022-05-19 DIAGNOSIS — E782 Mixed hyperlipidemia: Secondary | ICD-10-CM

## 2022-05-19 DIAGNOSIS — N250 Renal osteodystrophy: Secondary | ICD-10-CM | POA: Diagnosis not present

## 2022-05-19 DIAGNOSIS — N2581 Secondary hyperparathyroidism of renal origin: Secondary | ICD-10-CM | POA: Diagnosis not present

## 2022-05-19 DIAGNOSIS — Z992 Dependence on renal dialysis: Secondary | ICD-10-CM | POA: Diagnosis not present

## 2022-05-19 DIAGNOSIS — I428 Other cardiomyopathies: Secondary | ICD-10-CM | POA: Diagnosis not present

## 2022-05-19 NOTE — Progress Notes (Signed)
Clinical Summary Todd Robertson is a 76 y.o.male seen today for follow up of the following medical problems.    1. NICM   - previously significant LV dysfunction with LVEF 25-30% in 2004, last echo 2011 shows normalized LVEF at 60%. Cath 2004 with no significant coronary disease  - 08/2017 echo LVEF 55-60%   05/2020 echo LVEF 45-50%.   Jan 2022 Echo: LVEF 78-93%, grade I diastolic dysfunction - on midodrine '10mg'$  tid.  -no SOB/DOE, left foot swelling at times.        2. HTN   - some ongoing issues with low bp's with HD but improved with higher dry weight - he is on midodrine as well  - occasoinal low bp's but better and able to toelrate HD sessions.      3. Hyperlipidemia - labs followed by pcp - he is on atorvastatin - most recent labs at HD center   4. Prostate Cancer - treated radiation at [facility]  - followed by urologist.    5. ESRD - on HD since July 2021 - HD MWF at [facility]   6. PVCs - episode of NSVT during knee replacement - no recent symptoms   7. Renal cell carcinoma - s/p nephrectomy   8. Dilated aortic root - 07/2020 echo aortic root 41 mm - Jan 2022 echo 3.7 cm - essentialyl over time 3.7 to 4.1 based on echo measurements. Have not had strong indication for CTA   9. Post op Afib - issues after nephrectomy during 06/2020 admission. Admission also complicated by postop bleed, sepsis - was not started on anticoag due to postop bleeding   - no symptoms      Past Medical History:  Diagnosis Date   Arthritis    Cancer ([place])    prostate   Cardiomyopathy    secondary   CKD (chronic kidney disease)    DM2 (diabetes mellitus, type 2) (HCC)    Gout    HTN (hypertension)    unspec   Hypercholesterolemia    Nonischemic cardiomyopathy ([place])    a. EF 40-50% by most recent 2-D echo b. EF 25% by cardiac cath in 2004   Overweight(278.02)      Allergies  Allergen Reactions   No Known Allergies      Current Outpatient Medications   Medication Sig Dispense Refill   acetaminophen (TYLENOL) 325 MG tablet Take 1-2 tablets (325-650 mg total) by mouth every 4 (four) hours as needed for mild pain.     albuterol (VENTOLIN HFA) 108 (90 Base) MCG/ACT inhaler Inhale 1-2 puffs into the lungs every 6 (six) hours as needed for wheezing or shortness of breath. 6.7 g 1   allopurinol (ZYLOPRIM) 300 MG tablet Take 300 mg by mouth daily.     atorvastatin (LIPITOR) 40 MG tablet Take 1 tablet (40 mg total) by mouth daily. 90 tablet 3   budesonide-formoterol (SYMBICORT) 160-4.5 MCG/ACT inhaler Inhale 2 puffs into the lungs 2 (two) times daily. (Patient not taking: Reported on 05/12/2022) 1 each 12   calcium acetate (PHOSLO) 667 MG capsule Take 3 capsules (2,001 mg total) by mouth 3 (three) times daily with meals. 180 capsule 1   cetirizine (ZYRTEC) 10 MG tablet Take by mouth.     Cholecalciferol (VITAMIN D) 50 MCG (2000 UT) tablet Take 1 tablet (2,000 Units total) by mouth daily. 30 tablet 0   econazole nitrate 1 % cream Apply topically 2 (two) times daily. (Patient not taking: Reported on 09/14/2021)  insulin lispro (HUMALOG) 100 UNIT/ML KwikPen Inject into the skin. (Patient not taking: Reported on 05/12/2022)     linaclotide (LINZESS) 290 MCG CAPS capsule Take by mouth. (Patient not taking: Reported on 09/14/2021)     loratadine (CLARITIN) 10 MG tablet Take 10 mg by mouth daily. (Patient not taking: Reported on 09/14/2021)     metoprolol tartrate (LOPRESSOR) 25 MG tablet Take 0.5 tablets (12.5 mg total) by mouth 2 (two) times daily. 90 tablet 3   midodrine (PROAMATINE) 10 MG tablet TAKE 1 TABLET BY MOUTH THREE TIMES DAILY WITH MEALS 270 tablet 2   Multiple Vitamins-Minerals (CENTRUM SILVER 50+MEN) TABS Take 1 tablet by mouth daily.     multivitamin (RENA-VIT) TABS tablet Take 1 tablet by mouth at bedtime. 30 tablet 0   Polyethylene Glycol 3350 (MIRALAX PO) Take by mouth as needed.     senna (SENOKOT) 8.6 MG tablet Take 1 tablet by mouth daily as  needed for constipation.      tamsulosin (FLOMAX) 0.4 MG CAPS capsule Take 1 capsule (0.4 mg total) by mouth daily. 90 capsule 3   No current facility-administered medications for this visit.     Past Surgical History:  Procedure Laterality Date   AV FISTULA PLACEMENT Left 06/06/2020   Procedure: LEFT ARM ARTERIOVENOUS (AV) GRAFT USING 45CM GORTEX;  Surgeon: [name], MD;  Location: [place];  Service: Vascular;  Laterality: Left;   COLONOSCOPY     HERNIA REPAIR     INSERTION OF DIALYSIS CATHETER Right 06/06/2020   Procedure: INSERTION OF DIALYSIS CATHETER USING 23CM DOUBLE LUMEN CATHETER;  Surgeon: [name], MD;  Location: [place];  Service: Vascular;  Laterality: Right;   IR FLUORO GUIDE CV LINE RIGHT  05/20/2020   IR US GUIDE VASC ACCESS RIGHT  05/20/2020   KNEE ARTHROPLASTY Left 08/08/2017   Procedure: LEFT TOTAL KNEE ARTHROPLASTY WITH COMPUTER NAVIGATION;  Surgeon: Rod Can, MD;  Location: [place];  Service: Orthopedics;  Laterality: Left;  Needs RNFA   KNEE ARTHROPLASTY Right 11/17/2017   Procedure: RIGHT TOTAL KNEE ARTHROPLASTY WITH COMPUTER NAVIGATION;  Surgeon: Rod Can, MD;  Location: [facility];  Service: Orthopedics;  Laterality: Right;  NEEDS RNFA   ROBOT ASSISTED LAPAROSCOPIC NEPHRECTOMY Right 05/16/2020   Procedure: XI ROBOTIC ASSISTED LAPAROSCOPIC NEPHRECTOMY;  Surgeon: Cleon Gustin, MD;  Location: [facility];  Service: Urology;  Laterality: Right;  2.5 hrs     Allergies  Allergen Reactions   No Known Allergies       Family History  Problem Relation Age of Onset   Hypertension Mother    Kidney disease Mother    Heart disease Father    Kidney disease Other      Social History Todd Robertson reports that he quit smoking about 37 years ago. His smoking use included cigarettes. He started smoking about 56 years ago. He has a 10.00 pack-year smoking history. He has never used smokeless tobacco. Todd Robertson reports no history of alcohol use.   Review of  Systems CONSTITUTIONAL: No weight loss, fever, chills, weakness or fatigue.  HEENT: Eyes: No visual loss, blurred vision, double vision or yellow sclerae.No hearing loss, sneezing, congestion, runny nose or sore throat.  SKIN: No rash or itching.  CARDIOVASCULAR: per hpi RESPIRATORY: No shortness of breath, cough or sputum.  GASTROINTESTINAL: No anorexia, nausea, vomiting or diarrhea. No abdominal pain or blood.  GENITOURINARY: No burning on urination, no polyuria NEUROLOGICAL: No headache, dizziness, syncope, paralysis, ataxia, numbness or tingling in the  extremities. No change in bowel or bladder control.  MUSCULOSKELETAL: No muscle, back pain, joint pain or stiffness.  LYMPHATICS: No enlarged nodes. No history of splenectomy.  PSYCHIATRIC: No history of depression or anxiety.  ENDOCRINOLOGIC: No reports of sweating, cold or heat intolerance. No polyuria or polydipsia.  Marland Kitchen   Physical Examination Today's Vitals   05/19/22 1326  BP: 100/60  Pulse: 70  SpO2: 96%  Weight: 222 lb 9.6 oz (101 kg)  Height: '6\' 2"'$  (1.88 m)   Body mass index is 28.58 kg/m.  Gen: resting comfortably, no acute distress HEENT: no scleral icterus, pupils equal round and reactive, no palptable cervical adenopathy,  CV: RRR, no m/r/g no jvd Resp: Clear to auscultation bilaterally GI: abdomen is soft, non-tender, non-distended, normal bowel sounds, no hepatosplenomegaly MSK: extremities are warm, no edema.  Skin: warm, no rash Neuro:  no focal deficits Psych: appropriate affect   Diagnostic Studies Echocardiogram 05/28/2020. 1. Left ventricular ejection fraction, by estimation, is 45 to 50%. The left ventricle has mildly decreased function. The left ventricle demonstrates global hypokinesis. There is mild left ventricular hypertrophy. Left ventricular diastolic parameters are consistent with Grade II diastolic dysfunction (pseudonormalization). 2. Right ventricular systolic function is normal. The right  ventricular size is mildly enlarged. Tricuspid regurgitation signal is inadequate for assessing PA pressure. 3. Right atrial size was mildly dilated. 4. The mitral valve is normal in structure. No evidence of mitral valve regurgitation. No evidence of mitral stenosis. 5. The aortic valve is tricuspid. Aortic valve regurgitation is not visualized. Mild aortic valve sclerosis is present, with no evidence of aortic valve stenosis. 6. Aortic dilatation noted. 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 7. Technically difficult study with poor images.     Lower venous Doppler study 05/29/2020 RIGHT - There is no evidence of deep vein thrombosis in the lower extremity. - No cystic structure found in the popliteal fossa. LEFT: - There is no evidence of deep vein thrombosis in the lower extremity. - No cystic structure found in the popliteal fossa.    Assessment and Plan   1. Chronic systolic HF/NICM - most recent echo shows LVEF has normalized - fluid management per HD - medical therapy limited by low bp's, actually requiring midodrine - doing well without symptoms, continue current meds   2. PVCs - no symptoms, continue metoprolol  3.Hyperlipidemia - request labs   F/u 1 year  Arnoldo Lenis, M.D

## 2022-05-19 NOTE — Patient Instructions (Signed)
Medication Instructions:  Your physician recommends that you continue on your current medications as directed. Please refer to the Current Medication list given to you today.   Labwork: None  Testing/Procedures: none  Follow-Up:  Your physician recommends that you schedule a follow-up appointment in: 1 year  Any Other Special Instructions Will Be Listed Below (If Applicable).  You will receive a call in about 10 months reminding you to schedule your appointment. If you do not receive this call, please contact our office.   If you need a refill on your cardiac medications before your next appointment, please call your pharmacy.

## 2022-05-21 DIAGNOSIS — N2581 Secondary hyperparathyroidism of renal origin: Secondary | ICD-10-CM | POA: Diagnosis not present

## 2022-05-21 DIAGNOSIS — N186 End stage renal disease: Secondary | ICD-10-CM | POA: Diagnosis not present

## 2022-05-21 DIAGNOSIS — Z992 Dependence on renal dialysis: Secondary | ICD-10-CM | POA: Diagnosis not present

## 2022-05-21 DIAGNOSIS — D631 Anemia in chronic kidney disease: Secondary | ICD-10-CM | POA: Diagnosis not present

## 2022-05-21 DIAGNOSIS — D509 Iron deficiency anemia, unspecified: Secondary | ICD-10-CM | POA: Diagnosis not present

## 2022-05-21 DIAGNOSIS — N250 Renal osteodystrophy: Secondary | ICD-10-CM | POA: Diagnosis not present

## 2022-05-24 DIAGNOSIS — N2581 Secondary hyperparathyroidism of renal origin: Secondary | ICD-10-CM | POA: Diagnosis not present

## 2022-05-24 DIAGNOSIS — Z992 Dependence on renal dialysis: Secondary | ICD-10-CM | POA: Diagnosis not present

## 2022-05-24 DIAGNOSIS — N186 End stage renal disease: Secondary | ICD-10-CM | POA: Diagnosis not present

## 2022-05-24 DIAGNOSIS — D509 Iron deficiency anemia, unspecified: Secondary | ICD-10-CM | POA: Diagnosis not present

## 2022-05-24 DIAGNOSIS — D631 Anemia in chronic kidney disease: Secondary | ICD-10-CM | POA: Diagnosis not present

## 2022-05-24 DIAGNOSIS — N250 Renal osteodystrophy: Secondary | ICD-10-CM | POA: Diagnosis not present

## 2022-05-26 DIAGNOSIS — N2581 Secondary hyperparathyroidism of renal origin: Secondary | ICD-10-CM | POA: Diagnosis not present

## 2022-05-26 DIAGNOSIS — N250 Renal osteodystrophy: Secondary | ICD-10-CM | POA: Diagnosis not present

## 2022-05-26 DIAGNOSIS — Z992 Dependence on renal dialysis: Secondary | ICD-10-CM | POA: Diagnosis not present

## 2022-05-26 DIAGNOSIS — D631 Anemia in chronic kidney disease: Secondary | ICD-10-CM | POA: Diagnosis not present

## 2022-05-26 DIAGNOSIS — N186 End stage renal disease: Secondary | ICD-10-CM | POA: Diagnosis not present

## 2022-05-26 DIAGNOSIS — D509 Iron deficiency anemia, unspecified: Secondary | ICD-10-CM | POA: Diagnosis not present

## 2022-05-28 DIAGNOSIS — Z992 Dependence on renal dialysis: Secondary | ICD-10-CM | POA: Diagnosis not present

## 2022-05-28 DIAGNOSIS — D509 Iron deficiency anemia, unspecified: Secondary | ICD-10-CM | POA: Diagnosis not present

## 2022-05-28 DIAGNOSIS — D631 Anemia in chronic kidney disease: Secondary | ICD-10-CM | POA: Diagnosis not present

## 2022-05-28 DIAGNOSIS — N250 Renal osteodystrophy: Secondary | ICD-10-CM | POA: Diagnosis not present

## 2022-05-28 DIAGNOSIS — N186 End stage renal disease: Secondary | ICD-10-CM | POA: Diagnosis not present

## 2022-05-28 DIAGNOSIS — N2581 Secondary hyperparathyroidism of renal origin: Secondary | ICD-10-CM | POA: Diagnosis not present

## 2022-05-31 DIAGNOSIS — N2581 Secondary hyperparathyroidism of renal origin: Secondary | ICD-10-CM | POA: Diagnosis not present

## 2022-05-31 DIAGNOSIS — Z992 Dependence on renal dialysis: Secondary | ICD-10-CM | POA: Diagnosis not present

## 2022-05-31 DIAGNOSIS — D509 Iron deficiency anemia, unspecified: Secondary | ICD-10-CM | POA: Diagnosis not present

## 2022-05-31 DIAGNOSIS — N186 End stage renal disease: Secondary | ICD-10-CM | POA: Diagnosis not present

## 2022-05-31 DIAGNOSIS — D631 Anemia in chronic kidney disease: Secondary | ICD-10-CM | POA: Diagnosis not present

## 2022-05-31 DIAGNOSIS — N250 Renal osteodystrophy: Secondary | ICD-10-CM | POA: Diagnosis not present

## 2022-06-02 DIAGNOSIS — D631 Anemia in chronic kidney disease: Secondary | ICD-10-CM | POA: Diagnosis not present

## 2022-06-02 DIAGNOSIS — N2581 Secondary hyperparathyroidism of renal origin: Secondary | ICD-10-CM | POA: Diagnosis not present

## 2022-06-02 DIAGNOSIS — N250 Renal osteodystrophy: Secondary | ICD-10-CM | POA: Diagnosis not present

## 2022-06-02 DIAGNOSIS — Z992 Dependence on renal dialysis: Secondary | ICD-10-CM | POA: Diagnosis not present

## 2022-06-02 DIAGNOSIS — D509 Iron deficiency anemia, unspecified: Secondary | ICD-10-CM | POA: Diagnosis not present

## 2022-06-02 DIAGNOSIS — N186 End stage renal disease: Secondary | ICD-10-CM | POA: Diagnosis not present

## 2022-06-04 ENCOUNTER — Encounter (HOSPITAL_COMMUNITY): Payer: Self-pay

## 2022-06-04 ENCOUNTER — Encounter (HOSPITAL_COMMUNITY)
Admission: RE | Admit: 2022-06-04 | Discharge: 2022-06-04 | Disposition: A | Discharge status: Home / Self Care | Payer: Medicare Other | Source: Ambulatory Visit | Attending: Vascular Surgery | Admitting: Vascular Surgery

## 2022-06-04 DIAGNOSIS — D509 Iron deficiency anemia, unspecified: Secondary | ICD-10-CM | POA: Diagnosis not present

## 2022-06-04 DIAGNOSIS — Z992 Dependence on renal dialysis: Secondary | ICD-10-CM | POA: Diagnosis not present

## 2022-06-04 DIAGNOSIS — N2581 Secondary hyperparathyroidism of renal origin: Secondary | ICD-10-CM | POA: Diagnosis not present

## 2022-06-04 DIAGNOSIS — D631 Anemia in chronic kidney disease: Secondary | ICD-10-CM | POA: Diagnosis not present

## 2022-06-04 DIAGNOSIS — N186 End stage renal disease: Secondary | ICD-10-CM | POA: Diagnosis not present

## 2022-06-04 DIAGNOSIS — N250 Renal osteodystrophy: Secondary | ICD-10-CM | POA: Diagnosis not present

## 2022-06-07 DIAGNOSIS — Z992 Dependence on renal dialysis: Secondary | ICD-10-CM | POA: Diagnosis not present

## 2022-06-07 DIAGNOSIS — D631 Anemia in chronic kidney disease: Secondary | ICD-10-CM | POA: Diagnosis not present

## 2022-06-07 DIAGNOSIS — N250 Renal osteodystrophy: Secondary | ICD-10-CM | POA: Diagnosis not present

## 2022-06-07 DIAGNOSIS — N186 End stage renal disease: Secondary | ICD-10-CM | POA: Diagnosis not present

## 2022-06-07 DIAGNOSIS — N2581 Secondary hyperparathyroidism of renal origin: Secondary | ICD-10-CM | POA: Diagnosis not present

## 2022-06-07 DIAGNOSIS — D509 Iron deficiency anemia, unspecified: Secondary | ICD-10-CM | POA: Diagnosis not present

## 2022-06-08 ENCOUNTER — Ambulatory Visit (HOSPITAL_COMMUNITY): Payer: Medicare Other | Admitting: Certified Registered"

## 2022-06-08 ENCOUNTER — Other Ambulatory Visit: Payer: Self-pay

## 2022-06-08 ENCOUNTER — Encounter (HOSPITAL_COMMUNITY)
Admission: RE | Disposition: A | Discharge status: Home / Self Care | Payer: Self-pay | Source: Home / Self Care | Attending: Vascular Surgery

## 2022-06-08 ENCOUNTER — Encounter (HOSPITAL_COMMUNITY): Payer: Self-pay | Admitting: Vascular Surgery

## 2022-06-08 ENCOUNTER — Ambulatory Visit (HOSPITAL_BASED_OUTPATIENT_CLINIC_OR_DEPARTMENT_OTHER): Payer: Medicare Other | Admitting: Certified Registered"

## 2022-06-08 ENCOUNTER — Ambulatory Visit (HOSPITAL_COMMUNITY)
Admission: RE | Admit: 2022-06-08 | Discharge: 2022-06-08 | Disposition: A | Discharge status: Home / Self Care | Payer: Medicare Other | Attending: Vascular Surgery | Admitting: Vascular Surgery

## 2022-06-08 DIAGNOSIS — T82868A Thrombosis of vascular prosthetic devices, implants and grafts, initial encounter: Secondary | ICD-10-CM | POA: Diagnosis not present

## 2022-06-08 DIAGNOSIS — D759 Disease of blood and blood-forming organs, unspecified: Secondary | ICD-10-CM | POA: Insufficient documentation

## 2022-06-08 DIAGNOSIS — Z992 Dependence on renal dialysis: Secondary | ICD-10-CM

## 2022-06-08 DIAGNOSIS — Z87891 Personal history of nicotine dependence: Secondary | ICD-10-CM | POA: Diagnosis not present

## 2022-06-08 DIAGNOSIS — E1122 Type 2 diabetes mellitus with diabetic chronic kidney disease: Secondary | ICD-10-CM

## 2022-06-08 DIAGNOSIS — N185 Chronic kidney disease, stage 5: Secondary | ICD-10-CM | POA: Diagnosis not present

## 2022-06-08 DIAGNOSIS — Y713 Surgical instruments, materials and cardiovascular devices (including sutures) associated with adverse incidents: Secondary | ICD-10-CM | POA: Diagnosis not present

## 2022-06-08 DIAGNOSIS — I120 Hypertensive chronic kidney disease with stage 5 chronic kidney disease or end stage renal disease: Secondary | ICD-10-CM

## 2022-06-08 DIAGNOSIS — N186 End stage renal disease: Secondary | ICD-10-CM | POA: Insufficient documentation

## 2022-06-08 DIAGNOSIS — Y832 Surgical operation with anastomosis, bypass or graft as the cause of abnormal reaction of the patient, or of later complication, without mention of misadventure at the time of the procedure: Secondary | ICD-10-CM | POA: Insufficient documentation

## 2022-06-08 DIAGNOSIS — D649 Anemia, unspecified: Secondary | ICD-10-CM | POA: Diagnosis not present

## 2022-06-08 HISTORY — PX: AV FISTULA PLACEMENT: SHX1204

## 2022-06-08 LAB — POCT I-STAT, CHEM 8
BUN: 51 mg/dL — ABNORMAL HIGH (ref 8–23)
Calcium, Ion: 1.11 mmol/L — ABNORMAL LOW (ref 1.15–1.40)
Chloride: 99 mmol/L (ref 98–111)
Creatinine, Ser: 9.3 mg/dL — ABNORMAL HIGH (ref 0.61–1.24)
Glucose, Bld: 86 mg/dL (ref 70–99)
HCT: 33 % — ABNORMAL LOW (ref 39.0–52.0)
Hemoglobin: 11.2 g/dL — ABNORMAL LOW (ref 13.0–17.0)
Potassium: 4.3 mmol/L (ref 3.5–5.1)
Sodium: 136 mmol/L (ref 135–145)
TCO2: 22 mmol/L (ref 22–32)

## 2022-06-08 SURGERY — INSERTION OF ARTERIOVENOUS (AV) GORE-TEX GRAFT ARM
Anesthesia: General | Site: Arm Upper | Laterality: Left

## 2022-06-08 MED ORDER — CHLORHEXIDINE GLUCONATE 0.12 % MT SOLN
15.0000 mL | Freq: Once | OROMUCOSAL | Status: AC
  Administered 2022-06-08: 15 mL via OROMUCOSAL

## 2022-06-08 MED ORDER — SODIUM CHLORIDE 0.9 % IV SOLN
INTRAVENOUS | Status: DC
  Administered 2022-06-08: 1000 mL via INTRAVENOUS

## 2022-06-08 MED ORDER — ONDANSETRON HCL 4 MG/2ML IJ SOLN
INTRAMUSCULAR | Status: DC | PRN
  Administered 2022-06-08: 4 mg via INTRAVENOUS

## 2022-06-08 MED ORDER — CHLORHEXIDINE GLUCONATE 4 % EX LIQD: 60.0000 mL | Freq: Once | CUTANEOUS | Status: DC

## 2022-06-08 MED ORDER — LACTATED RINGERS IV SOLN
INTRAVENOUS | Status: DC
Start: 2022-06-08 — End: 2022-06-08

## 2022-06-08 MED ORDER — PROPOFOL 500 MG/50ML IV EMUL
INTRAVENOUS | Status: DC | PRN
  Administered 2022-06-08: 25 ug/kg/min via INTRAVENOUS

## 2022-06-08 MED ORDER — MIDAZOLAM HCL 5 MG/5ML IJ SOLN
INTRAMUSCULAR | Status: DC | PRN
  Administered 2022-06-08 (×2): 1 mg via INTRAVENOUS

## 2022-06-08 MED ORDER — MIDAZOLAM HCL 2 MG/2ML IJ SOLN
INTRAMUSCULAR | Status: AC
  Filled 2022-06-08: qty 2

## 2022-06-08 MED ORDER — ORAL CARE MOUTH RINSE: 15.0000 mL | Freq: Once | OROMUCOSAL | Status: AC

## 2022-06-08 MED ORDER — HEPARIN 6000 UNIT IRRIGATION SOLUTION
Status: DC | PRN
  Administered 2022-06-08: 1

## 2022-06-08 MED ORDER — 0.9 % SODIUM CHLORIDE (POUR BTL) OPTIME
TOPICAL | Status: DC | PRN
  Administered 2022-06-08: 1000 mL

## 2022-06-08 MED ORDER — CEFAZOLIN SODIUM-DEXTROSE 2-4 GM/100ML-% IV SOLN
2.0000 g | INTRAVENOUS | Status: AC
  Administered 2022-06-08: 2 g via INTRAVENOUS

## 2022-06-08 MED ORDER — SODIUM CHLORIDE 0.9 % IV SOLN: INTRAVENOUS | Status: DC | PRN

## 2022-06-08 MED ORDER — CEFAZOLIN SODIUM-DEXTROSE 2-4 GM/100ML-% IV SOLN
INTRAVENOUS | Status: AC
  Filled 2022-06-08: qty 100

## 2022-06-08 MED ORDER — LIDOCAINE-EPINEPHRINE 0.5 %-1:200000 IJ SOLN
INTRAMUSCULAR | Status: DC | PRN
  Administered 2022-06-08: 17 mL

## 2022-06-08 MED ORDER — OXYCODONE-ACETAMINOPHEN 5-325 MG PO TABS: 1.0000 | ORAL_TABLET | Freq: Four times a day (QID) | ORAL | 0 refills | Status: DC | PRN

## 2022-06-08 MED ORDER — LIDOCAINE-EPINEPHRINE 0.5 %-1:200000 IJ SOLN
INTRAMUSCULAR | Status: AC
  Filled 2022-06-08: qty 1

## 2022-06-08 MED ORDER — HEPARIN SODIUM (PORCINE) 1000 UNIT/ML IJ SOLN
INTRAMUSCULAR | Status: AC
  Filled 2022-06-08: qty 6

## 2022-06-08 SURGICAL SUPPLY — 41 items
ARMBAND PINK RESTRICT EXTREMIT (MISCELLANEOUS) ×2 IMPLANT
BAG HAMPER (MISCELLANEOUS) ×2 IMPLANT
CANNULA VESSEL 3MM 2 BLNT TIP (CANNULA) ×2 IMPLANT
CLIP LIGATING EXTRA MED SLVR (CLIP) ×2 IMPLANT
CLIP LIGATING EXTRA SM BLUE (MISCELLANEOUS) ×2 IMPLANT
COVER LIGHT HANDLE STERIS (MISCELLANEOUS) ×4 IMPLANT
COVER MAYO STAND XLG (MISCELLANEOUS) ×2 IMPLANT
DERMABOND ADVANCED (GAUZE/BANDAGES/DRESSINGS) ×1
DERMABOND ADVANCED .7 DNX12 (GAUZE/BANDAGES/DRESSINGS) ×1 IMPLANT
ELECT REM PT RETURN 9FT ADLT (ELECTROSURGICAL) ×2
ELECTRODE REM PT RTRN 9FT ADLT (ELECTROSURGICAL) ×1 IMPLANT
GAUZE SPONGE 4X4 12PLY STRL (GAUZE/BANDAGES/DRESSINGS) ×4 IMPLANT
GLOVE BIOGEL PI IND STRL 7.0 (GLOVE) ×2 IMPLANT
GLOVE BIOGEL PI INDICATOR 7.0 (GLOVE) ×2
GLOVE SURG MICRO LTX SZ7.5 (GLOVE) ×2 IMPLANT
GOWN STRL REUS W/TWL LRG LVL3 (GOWN DISPOSABLE) ×6 IMPLANT
GRAFT GORETEX STRT 4-7X45 (Vascular Products) ×1 IMPLANT
IV NS 500ML (IV SOLUTION) ×2
IV NS 500ML BAXH (IV SOLUTION) ×2 IMPLANT
KIT BLADEGUARD II DBL (SET/KITS/TRAYS/PACK) ×2 IMPLANT
KIT TURNOVER KIT A (KITS) ×2 IMPLANT
MANIFOLD NEPTUNE II (INSTRUMENTS) ×2 IMPLANT
MARKER SKIN DUAL TIP RULER LAB (MISCELLANEOUS) ×4 IMPLANT
NDL HYPO 18GX1.5 BLUNT FILL (NEEDLE) ×1 IMPLANT
NEEDLE HYPO 18GX1.5 BLUNT FILL (NEEDLE) ×2 IMPLANT
NS IRRIG 1000ML POUR BTL (IV SOLUTION) ×2 IMPLANT
PACK CV ACCESS (CUSTOM PROCEDURE TRAY) ×2 IMPLANT
PAD ARMBOARD 7.5X6 YLW CONV (MISCELLANEOUS) ×2 IMPLANT
SET BASIN LINEN APH (SET/KITS/TRAYS/PACK) ×2 IMPLANT
SOL PREP POV-IOD 4OZ 10% (MISCELLANEOUS) ×2 IMPLANT
SOL PREP PROV IODINE SCRUB 4OZ (MISCELLANEOUS) ×2 IMPLANT
SPONGE T-LAP 18X18 ~~LOC~~+RFID (SPONGE) ×2 IMPLANT
SUT PROLENE 6 0 CC (SUTURE) ×2 IMPLANT
SUT SILK 2 0 FSL 18 (SUTURE) ×2 IMPLANT
SUT VIC AB 3-0 SH 27 (SUTURE) ×2
SUT VIC AB 3-0 SH 27X BRD (SUTURE) ×1 IMPLANT
SYR 10ML LL (SYRINGE) ×2 IMPLANT
SYR 50ML LL SCALE MARK (SYRINGE) ×2 IMPLANT
SYR CONTROL 10ML LL (SYRINGE) ×2 IMPLANT
SYR TOOMEY 50ML (SYRINGE) IMPLANT
UNDERPAD 30X36 HEAVY ABSORB (UNDERPADS AND DIAPERS) ×2 IMPLANT

## 2022-06-08 NOTE — Transfer of Care (Signed)
Immediate Anesthesia Transfer of Care Note  Patient: Todd Robertson  Procedure(s) Performed: INSERTION OF ARTERIOVENOUS GORE-TEX GRAFT UPPER ARM (Left: Arm Upper)  Patient Location: PACU  Anesthesia Type:General  Level of Consciousness: awake, alert , oriented and patient cooperative  Airway & Oxygen Therapy: Patient Spontanous Breathing  Post-op Assessment: Report given to RN, Post -op Vital signs reviewed and stable and Patient moving all extremities X 4  Post vital signs: Reviewed and stable  Last Vitals:  Vitals Value Taken Time  BP 133/90 06/08/22 1138  Temp    Pulse 83   Resp 15 06/08/22 1139  SpO2 99   Vitals shown include unvalidated device data.  Last Pain:  Vitals:   06/08/22 0800  TempSrc: Oral  PainSc: 6       Patients Stated Pain Goal: 8 (33/54/56 2563)  Complications: No notable events documented.

## 2022-06-08 NOTE — Anesthesia Preprocedure Evaluation (Signed)
Anesthesia Evaluation  Patient identified by MRN, date of birth, ID band Patient awake    Reviewed: Allergy & Precautions, H&P , NPO status , Patient's Chart, lab work & pertinent test results, reviewed documented beta blocker date and time   Airway Mallampati: II  TM Distance: >3 FB Neck ROM: full    Dental no notable dental hx.    Pulmonary neg pulmonary ROS, former smoker,    Pulmonary exam normal breath sounds clear to auscultation       Cardiovascular Exercise Tolerance: Good hypertension, negative cardio ROS   Rhythm:regular Rate:Normal     Neuro/Psych negative neurological ROS  negative psych ROS   GI/Hepatic negative GI ROS, Neg liver ROS,   Endo/Other  negative endocrine ROSdiabetes  Renal/GU ESRF and DialysisRenal disease  negative genitourinary   Musculoskeletal   Abdominal   Peds  Hematology  (+) Blood dyscrasia, anemia ,   Anesthesia Other Findings   Reproductive/Obstetrics negative OB ROS                             Anesthesia Physical Anesthesia Plan  ASA: 3  Anesthesia Plan: General   Post-op Pain Management:    Induction:   PONV Risk Score and Plan: Ondansetron  Airway Management Planned:   Additional Equipment:   Intra-op Plan:   Post-operative Plan:   Informed Consent: I have reviewed the patients History and Physical, chart, labs and discussed the procedure including the risks, benefits and alternatives for the proposed anesthesia with the patient or authorized representative who has indicated his/her understanding and acceptance.     Dental Advisory Given  Plan Discussed with: CRNA  Anesthesia Plan Comments:         Anesthesia Quick Evaluation

## 2022-06-08 NOTE — Interval H&P Note (Signed)
History and Physical Interval Note:  06/08/2022 9:19 AM  Todd Robertson  has presented today for surgery, with the diagnosis of ESRD  N18.6.  The various methods of treatment have been discussed with the patient and family. After consideration of risks, benefits and other options for treatment, the patient has consented to  Procedure(s): INSERTION OF ARTERIOVENOUS GORE-TEX GRAFT UPPER ARM (Left) as a surgical intervention.  The patient's history has been reviewed, patient examined, no change in status, stable for surgery.  I have reviewed the patient's chart and labs.  Questions were answered to the patient's satisfaction.     Curt Jews

## 2022-06-08 NOTE — Discharge Instructions (Signed)
[facility]  Discharge Instructions  AV Fistula or Graft Surgery for Dialysis Access  Please refer to the following instructions for your post-procedure care. Your surgeon or physician assistant will discuss any changes with you.  Activity  You may drive the day following your surgery, if you are comfortable and no longer taking prescription pain medication. Resume full activity as the soreness in your incision resolves.  Bathing/Showering  You may shower after you go home. Keep your incision dry for 48 hours. Do not soak in a bathtub, hot tub, or swim until the incision heals completely. You may not shower if you have a hemodialysis catheter.  Incision Care  Clean your incision with mild soap and water after 48 hours. Pat the area dry with a clean towel. You do not need a bandage unless otherwise instructed. Do not apply any ointments or creams to your incision. You may have skin glue on your incision. Do not peel it off. It will come off on its own in about one week. Your arm may swell a bit after surgery. To reduce swelling use pillows to elevate your arm so it is above your heart. Your doctor will tell you if you need to lightly wrap your arm with an ACE bandage.  Diet  Resume your normal diet. There are not special food restrictions following this procedure. In order to heal from your surgery, it is CRITICAL to get adequate nutrition. Your body requires vitamins, minerals, and protein. Vegetables are the best source of vitamins and minerals. Vegetables also provide the perfect balance of protein. Processed food has little nutritional value, so try to avoid this.  Medications  Resume taking all of your medications. If your incision is causing pain, you may take over-the counter pain relievers such as acetaminophen (Tylenol). If you were prescribed a stronger pain medication, please be aware these medications can cause nausea and constipation. Prevent  nausea by taking the medication with a snack or meal. Avoid constipation by drinking plenty of fluids and eating foods with high amount of fiber, such as fruits, vegetables, and grains.  Do not take Tylenol if you are taking prescription pain medications.  Follow up Your surgeon may want to see you in the office following your access surgery. If so, this will be arranged at the time of your surgery.  Please call us immediately for any of the following conditions:  Increased pain, redness, drainage (pus) from your incision site Fever of 101 degrees or higher Severe or worsening pain at your incision site Hand pain or numbness.  Reduce your risk of vascular disease:  Stop smoking. If you would like help, call [facility] at 1-800-QUIT-NOW 501-632-0904) or [place] at [place] your cholesterol Maintain a desired weight Control your diabetes Keep your blood pressure down  Dialysis  It will take several weeks to several months for your new dialysis access to be ready for use. Your surgeon will determine when it is okay to use it. Your nephrologist will continue to direct your dialysis. You can continue to use your Permcath until your new access is ready for use.   06/08/2022 Todd Robertson [id number] 01-16-1946  Surgeon(s): Kyla Duffy, Arvilla Meres, MD  Procedure(s): INSERTION OF ARTERIOVENOUS GORE-TEX GRAFT UPPER ARM   May stick graft immediately   May stick graft on designated area only:    Do not stick graft for 4 weeks    If you have any questions, please call the office at  [phone].  

## 2022-06-08 NOTE — Op Note (Signed)
    OPERATIVE REPORT  DATE OF SURGERY: 06/08/2022  PATIENT: Todd Robertson, 76 y.o. male MRN: [id number]  DOB: [birth year]  PRE-OPERATIVE DIAGNOSIS: End-stage renal disease  POST-OPERATIVE DIAGNOSIS:  Same  PROCEDURE: Left upper arm AV Gore-Tex graft  SURGEON:  Curt Jews, M.D.  PHYSICIAN ASSISTANT: Fulton Mole, RNFA  The assistant was needed for exposure and to expedite the case  ANESTHESIA: Local with sedation  EBL: per anesthesia record  Total I/O In: 500 [I.V.:400; IV Piggyback:100] Out: 5 [Blood:5]  BLOOD ADMINISTERED: none  DRAINS: none  SPECIMEN: none  COUNTS CORRECT:  YES  PATIENT DISPOSITION:  PACU - hemodynamically stable  PROCEDURE DETAILS: Patient presents today for left upper arm AV Gore-Tex graft.  He had functioning left forearm loop graft for a number of years.  He recently had failure of this which was not amenable to percutaneous treatment at [facility].  On presentation today he does have some erythema and fullness on the radial aspect of the loop.  The patient denies any fevers or chills or other constitutional symptoms.  I discussed options with him.  I explained that we typically do not remove the old nonfunctional grafts.  I am concerned that if we open this area today that I would not feel comfortable placing a new graft with any concern of infection.  I have recommended that we place a new graft today and follow the area over the old graft as an outpatient.  If this does become draining or having exposure of his graft we could remove this at a subsequent date.  He understands and wishes to proceed  The area of the left arm was prepped and draped in the usual sterile fashion.  Incision was made at the antecubital space over the brachial artery.  The artery was relatively small and did have a moderate amount of atherosclerotic change.  A separate incision was made over the axilla and the axillary vein was exposed.  This was of large caliber.  A tunnel  was created over the bicep from the antecubital space to the axilla and a 4 x 7 mm tapered Gore-Tex graft was brought through the tunnel.  The brachial artery was occluded proximally and distally and was opened with an 11 blade and extended longitudinally with Potts scissors.  The graft was spatulated and the proximal 5 mm diameter and was sewn end-to-side to the artery with a running 6-0 Prolene suture.  This anastomosis was tested and found to be adequate.  The graft was flushed with heparinized saline and reoccluded.  The axillary vein was then occluded proximally distally and was opened with an 11 blade and sent lossing scissors.  The graft was cut to the appropriate length and spatulated and sewn end-to-side to the vein with a running 6-0 Prolene suture.  Clamps were removed and excellent thrill was noted.  The wounds were irrigated with saline.  Hemostasis was obtained with electrocautery.  The wounds were closed with 3-0 Vicryl and the subcutaneous and subcuticular tissue.  Sterile dressing was applied and the patient was transferred to the recovery room in stable condition   Todd Robertson, M.D., [facility] 06/08/2022 12:02 PM  Note: Portions of this report may have been transcribed using voice recognition software.  Every effort has been made to ensure accuracy; however, inadvertent computerized transcription errors may still be present.

## 2022-06-09 ENCOUNTER — Encounter (HOSPITAL_COMMUNITY): Payer: Self-pay | Admitting: Vascular Surgery

## 2022-06-09 DIAGNOSIS — Z992 Dependence on renal dialysis: Secondary | ICD-10-CM | POA: Diagnosis not present

## 2022-06-09 DIAGNOSIS — N2581 Secondary hyperparathyroidism of renal origin: Secondary | ICD-10-CM | POA: Diagnosis not present

## 2022-06-09 DIAGNOSIS — N250 Renal osteodystrophy: Secondary | ICD-10-CM | POA: Diagnosis not present

## 2022-06-09 DIAGNOSIS — D631 Anemia in chronic kidney disease: Secondary | ICD-10-CM | POA: Diagnosis not present

## 2022-06-09 DIAGNOSIS — N186 End stage renal disease: Secondary | ICD-10-CM | POA: Diagnosis not present

## 2022-06-09 DIAGNOSIS — D509 Iron deficiency anemia, unspecified: Secondary | ICD-10-CM | POA: Diagnosis not present

## 2022-06-09 NOTE — Anesthesia Postprocedure Evaluation (Signed)
Anesthesia Post Note  Patient: Todd Robertson  Procedure(s) Performed: INSERTION OF ARTERIOVENOUS GORE-TEX GRAFT UPPER ARM (Left: Arm Upper)  Patient location during evaluation: Phase II Anesthesia Type: General Level of consciousness: awake Pain management: pain level controlled Vital Signs Assessment: post-procedure vital signs reviewed and stable Respiratory status: spontaneous breathing and respiratory function stable Cardiovascular status: blood pressure returned to baseline and stable Postop Assessment: no headache and no apparent nausea or vomiting Anesthetic complications: no Comments: Late entry   No notable events documented.   Last Vitals:  Vitals:   06/08/22 1200 06/08/22 1224  BP: (!) 144/70 138/65  Pulse: 78 88  Resp: 12 16  Temp:  36.7 C  SpO2: 99% 96%    Last Pain:  Vitals:   06/08/22 1224  TempSrc: Oral  PainSc: [place]

## 2022-06-10 ENCOUNTER — Other Ambulatory Visit: Payer: Medicare Other

## 2022-06-10 DIAGNOSIS — N401 Enlarged prostate with lower urinary tract symptoms: Secondary | ICD-10-CM | POA: Diagnosis not present

## 2022-06-10 DIAGNOSIS — N138 Other obstructive and reflux uropathy: Secondary | ICD-10-CM | POA: Diagnosis not present

## 2022-06-11 DIAGNOSIS — N186 End stage renal disease: Secondary | ICD-10-CM | POA: Diagnosis not present

## 2022-06-11 DIAGNOSIS — N250 Renal osteodystrophy: Secondary | ICD-10-CM | POA: Diagnosis not present

## 2022-06-11 DIAGNOSIS — N2581 Secondary hyperparathyroidism of renal origin: Secondary | ICD-10-CM | POA: Diagnosis not present

## 2022-06-11 DIAGNOSIS — Z992 Dependence on renal dialysis: Secondary | ICD-10-CM | POA: Diagnosis not present

## 2022-06-11 DIAGNOSIS — D631 Anemia in chronic kidney disease: Secondary | ICD-10-CM | POA: Diagnosis not present

## 2022-06-11 DIAGNOSIS — D509 Iron deficiency anemia, unspecified: Secondary | ICD-10-CM | POA: Diagnosis not present

## 2022-06-11 LAB — PSA: Prostate Specific Ag, Serum: 0.9 ng/mL (ref 0.0–4.0)

## 2022-06-14 ENCOUNTER — Telehealth: Payer: Self-pay

## 2022-06-14 DIAGNOSIS — N2581 Secondary hyperparathyroidism of renal origin: Secondary | ICD-10-CM | POA: Diagnosis not present

## 2022-06-14 DIAGNOSIS — D631 Anemia in chronic kidney disease: Secondary | ICD-10-CM | POA: Diagnosis not present

## 2022-06-14 DIAGNOSIS — N250 Renal osteodystrophy: Secondary | ICD-10-CM | POA: Diagnosis not present

## 2022-06-14 DIAGNOSIS — Z992 Dependence on renal dialysis: Secondary | ICD-10-CM | POA: Diagnosis not present

## 2022-06-14 DIAGNOSIS — N186 End stage renal disease: Secondary | ICD-10-CM | POA: Diagnosis not present

## 2022-06-14 DIAGNOSIS — D509 Iron deficiency anemia, unspecified: Secondary | ICD-10-CM | POA: Diagnosis not present

## 2022-06-14 NOTE — Telephone Encounter (Signed)
Pt's wife, [name], called stating that the pt's old fistula site is draining.  Reviewed pt's chart, returned call for clarification, two identifiers used. She states the new site is doing well, except some itching. Informed her that the skin glue may be causing the skin to feel itchy, but it should be peeling off on its own soon. She stated that Dr Early saw the boil on the old site when he did the surgery on 8/1. She denies any pain, redness, or fever. She was instructed to monitor for infection symptoms, apply warm compresses to the boil, and seek urgent care if he developed any worsening symptoms. Pt to keep area clean and dry, changing gauze often. Confirmed understanding.

## 2022-06-16 DIAGNOSIS — D631 Anemia in chronic kidney disease: Secondary | ICD-10-CM | POA: Diagnosis not present

## 2022-06-16 DIAGNOSIS — D509 Iron deficiency anemia, unspecified: Secondary | ICD-10-CM | POA: Diagnosis not present

## 2022-06-16 DIAGNOSIS — Z992 Dependence on renal dialysis: Secondary | ICD-10-CM | POA: Diagnosis not present

## 2022-06-16 DIAGNOSIS — N250 Renal osteodystrophy: Secondary | ICD-10-CM | POA: Diagnosis not present

## 2022-06-16 DIAGNOSIS — N2581 Secondary hyperparathyroidism of renal origin: Secondary | ICD-10-CM | POA: Diagnosis not present

## 2022-06-16 DIAGNOSIS — N186 End stage renal disease: Secondary | ICD-10-CM | POA: Diagnosis not present

## 2022-06-17 ENCOUNTER — Other Ambulatory Visit: Payer: Medicare Other

## 2022-06-17 ENCOUNTER — Ambulatory Visit (INDEPENDENT_AMBULATORY_CARE_PROVIDER_SITE_OTHER): Payer: Medicare Other | Admitting: Urology

## 2022-06-17 ENCOUNTER — Encounter: Payer: Self-pay | Admitting: Urology

## 2022-06-17 VITALS — BP 107/60 | HR 77 | Ht 74.0 in | Wt 220.0 lb

## 2022-06-17 DIAGNOSIS — Z85528 Personal history of other malignant neoplasm of kidney: Secondary | ICD-10-CM

## 2022-06-17 DIAGNOSIS — N401 Enlarged prostate with lower urinary tract symptoms: Secondary | ICD-10-CM

## 2022-06-17 DIAGNOSIS — N186 End stage renal disease: Secondary | ICD-10-CM

## 2022-06-17 DIAGNOSIS — R3912 Poor urinary stream: Secondary | ICD-10-CM | POA: Diagnosis not present

## 2022-06-17 DIAGNOSIS — R9721 Rising PSA following treatment for malignant neoplasm of prostate: Secondary | ICD-10-CM

## 2022-06-17 DIAGNOSIS — Z992 Dependence on renal dialysis: Secondary | ICD-10-CM | POA: Diagnosis not present

## 2022-06-17 DIAGNOSIS — N138 Other obstructive and reflux uropathy: Secondary | ICD-10-CM | POA: Diagnosis not present

## 2022-06-17 DIAGNOSIS — Z8546 Personal history of malignant neoplasm of prostate: Secondary | ICD-10-CM | POA: Diagnosis not present

## 2022-06-17 LAB — MICROSCOPIC EXAMINATION
Epithelial Cells (non renal): NONE SEEN /hpf (ref 0–10)
RBC, Urine: NONE SEEN /hpf (ref 0–2)
Renal Epithel, UA: NONE SEEN /hpf
WBC, UA: NONE SEEN /hpf (ref 0–5)

## 2022-06-17 LAB — URINALYSIS, ROUTINE W REFLEX MICROSCOPIC
Bilirubin, UA: NEGATIVE
Glucose, UA: NEGATIVE
Leukocytes,UA: NEGATIVE
Nitrite, UA: NEGATIVE
RBC, UA: NEGATIVE
Specific Gravity, UA: 1.02 (ref 1.005–1.030)
Urobilinogen, Ur: 0.2 mg/dL (ref 0.2–1.0)
pH, UA: 5 (ref 5.0–7.5)

## 2022-06-17 NOTE — Progress Notes (Signed)
Subjective:  1. History of prostate cancer   2. Rising PSA following treatment for malignant neoplasm of prostate   3. BPH with urinary obstruction   4. Weak urinary stream   5. History of kidney cancer    06/17/22: Loic returns today in f/u for his history of prostate cancer treated with EXRT 8-9 years ago.  His PSA has been rising but slowly with an increased from 0.8 to 0.9 over the last 6 months. 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  He continues to make moderate urine.  His iPSS is 14 with a reduced stream and urgency.  He remains on tamsulosin. The urine is clear today.  He has no weight loss or bone pain.  He didn't get the CXR that was ordered to be done in April for f/u of the renal cell CA.  UA is unremarkable.   12/17/21: Mr. Prien returns today in f/u.   He last saw Dr. Alyson Ingles on 08/26/21 for f/u of his history of a right nephrectomy for RCCa in 7/21 and imaging was unremarkable.  He is to have a CXR in a couple of months.  He has a history of prostate cancer treated with EXRT and his PSA is 0.8 which is a slight increase from 0.6 in 5/22.  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  He has a history of UTI's.  He last got Cipro in 10/22.  He had cystoscopy on 04/09/21.   HIs UA today is unremarkable.  He has some burning at times.  His IPSS is 17 with frequency and nocturia x 2.    06/18/21:  Mr. Newbury returns today in f/u.  He is doing well with an IPSS of 3.  He has had no symptoms of a recurrent infection.   He didn't get a UA today.   He remains on tamsulosin but is off of of the Levaquin prescribed for post dialysis use on 04/15/21.    He had a right nephrectomy for RCCa in 7/21 and is to have an [place] and CXR next month.   04/09/21: Mr. Procter returns today in f/u for cystoscopy to evaluate his persistent voiding symptoms and cystitis.   His culture on 04/02/21 was negative.   His PSA is 0.6 which is down from the prior levels.  He was given gent '160mg'$  at his last visit and had some transient  symptom improvement.     GU Hx: Mr. Nuttall returns today in f/u for his history of cystitis.  He has dysuria and difficulty voiding.  He has some urgency and frequency.  He has had no hematuria.   He has been treated with bactrim after a round of ceftin.  He has been off of the anitbiotics for about 2 weeks.  He has had no fever.  He had Klebsiella on 03/10/21 and it was sensitive to the bactrim which he was last given.   He has variable urine output.   He has increased frequency in the afternoon.  He will generally go 4-5x daily.   He has a history of prostate cancer treated with radiation therapy 7-8 years ago.   He had a right radical nephrectomy in 7/21.   The urinary symptoms began after the procedure.   The last PSA's I find were 4.1 in 1/17 and 1.4 in 8/17.     IPSS     Row Name 06/17/22 1500         International Prostate Symptom Score   How often  have you had the sensation of not emptying your bladder? Less than 1 in 5     How often have you had to urinate less than every two hours? Less than 1 in 5 times     How often have you found you stopped and started again several times when you urinated? Less than half the time     How often have you found it difficult to postpone urination? About half the time     How often have you had a weak urinary stream? More than half the time     How often have you had to strain to start urination? Less than half the time     How many times did you typically get up at night to urinate? 1 Time     Total IPSS Score 14       Quality of Life due to urinary symptoms   If you were to spend the rest of your life with your urinary condition just the way it is now how would you feel about that? Mixed               ROS:  ROS:  A complete review of systems was performed.  All systems are negative except for pertinent findings as noted.   ROS  No Known Allergies   Outpatient Encounter Medications as of 06/17/2022  Medication Sig   acetaminophen  (TYLENOL) 500 MG tablet Take 1,000 mg by mouth every 6 (six) hours as needed for moderate pain.   albuterol (VENTOLIN HFA) 108 (90 Base) MCG/ACT inhaler Inhale 1-2 puffs into the lungs every 6 (six) hours as needed for wheezing or shortness of breath.   allopurinol (ZYLOPRIM) 300 MG tablet Take 300 mg by mouth daily.   atorvastatin (LIPITOR) 40 MG tablet Take 1 tablet (40 mg total) by mouth daily.   BREO ELLIPTA 100-25 MCG/ACT AEPB Inhale 1 puff into the lungs daily.   calcium acetate (PHOSLO) 667 MG capsule Take 3 capsules (2,001 mg total) by mouth 3 (three) times daily with meals. (Patient taking differently: Take 1,334 mg by mouth 3 (three) times daily with meals.)   cetirizine (ZYRTEC) 10 MG tablet Take 10 mg by mouth daily.   Cholecalciferol (VITAMIN D) 50 MCG (2000 UT) tablet Take 1 tablet (2,000 Units total) by mouth daily.   methocarbamol (ROBAXIN) 500 MG tablet Take 500 mg by mouth 3 (three) times a week. Take Sun, Tue, and Thurs (day before dialysis)   metoprolol tartrate (LOPRESSOR) 25 MG tablet Take 0.5 tablets (12.5 mg total) by mouth 2 (two) times daily. (Patient taking differently: Take 12.5 mg by mouth every Monday, Wednesday, and Friday. after dialysis)   midodrine (PROAMATINE) 10 MG tablet TAKE 1 TABLET BY MOUTH THREE TIMES DAILY WITH MEALS   Multiple Vitamins-Minerals (CENTRUM SILVER 50+MEN) TABS Take 1 tablet by mouth daily.   multivitamin (RENA-VIT) TABS tablet Take 1 tablet by mouth at bedtime.   Naphazoline HCl (CLEAR EYES OP) Place 1 drop into both eyes daily.   oxyCODONE-acetaminophen (PERCOCET) 5-325 MG tablet Take 1 tablet by mouth every 6 (six) hours as needed for severe pain.   Polyethylene Glycol 3350 (MIRALAX PO) Take 17 g by mouth as needed (constipation).   senna (SENOKOT) 8.6 MG tablet Take 2 tablets by mouth daily as needed for constipation.   tamsulosin (FLOMAX) 0.4 MG CAPS capsule Take 1 capsule (0.4 mg total) by mouth daily.   trolamine salicylate (ASPERCREME)  10 % cream Apply 1 Application topically  as needed for muscle pain.   No facility-administered encounter medications on file as of 06/17/2022.    Past Medical History:  Diagnosis Date   Arthritis    Cancer ([place])    prostate   Cardiomyopathy    secondary   CKD (chronic kidney disease)    DM2 (diabetes mellitus, type 2) (HCC)    Gout    HTN (hypertension)    unspec   Hypercholesterolemia    Nonischemic cardiomyopathy (HCC)    a. EF 40-50% by most recent 2-D echo b. EF 25% by cardiac cath in 2004   Overweight(278.02)     Past Surgical History:  Procedure Laterality Date   AV FISTULA PLACEMENT Left 06/06/2020   Procedure: LEFT ARM ARTERIOVENOUS (AV) GRAFT USING 45CM GORTEX;  Surgeon: [name], MD;  Location: [facility];  Service: Vascular;  Laterality: Left;   AV FISTULA PLACEMENT Left 06/08/2022   Procedure: INSERTION OF ARTERIOVENOUS GORE-TEX GRAFT UPPER ARM;  Surgeon: [name], MD;  Location: [facility];  Service: Vascular;  Laterality: Left;   COLONOSCOPY     HERNIA REPAIR     INSERTION OF DIALYSIS CATHETER Right 06/06/2020   Procedure: INSERTION OF DIALYSIS CATHETER USING 23CM DOUBLE LUMEN CATHETER;  Surgeon: [name], MD;  Location: [facility];  Service: Vascular;  Laterality: Right;   IR FLUORO GUIDE CV LINE RIGHT  05/20/2020   IR US GUIDE [place] RIGHT  05/20/2020   KNEE ARTHROPLASTY Left 08/08/2017   Procedure: LEFT TOTAL KNEE ARTHROPLASTY WITH COMPUTER NAVIGATION;  Surgeon: Rod Can, MD;  Location: [facility];  Service: Orthopedics;  Laterality: Left;  Needs RNFA   KNEE ARTHROPLASTY Right 11/17/2017   Procedure: RIGHT TOTAL KNEE ARTHROPLASTY WITH COMPUTER NAVIGATION;  Surgeon: Rod Can, MD;  Location: [facility];  Service: Orthopedics;  Laterality: Right;  NEEDS RNFA   ROBOT ASSISTED LAPAROSCOPIC NEPHRECTOMY Right 05/16/2020   Procedure: XI ROBOTIC ASSISTED LAPAROSCOPIC NEPHRECTOMY;  Surgeon: Cleon Gustin, MD;  Location: [facility];  Service: Urology;  Laterality:  Right;  2.5 hrs    Social History   Socioeconomic History   Marital status: Married    Spouse name: Not on file   Number of children: 2   Years of education: Not on file   Highest education level: Not on file  Occupational History   Occupation: retired  Tobacco Use   Smoking status: Former    Packs/day: 0.50    Years: 20.00    Total pack years: 10.00    Types: Cigarettes    Start date: 02/12/1966    Quit date: 11/08/1984    Years since quitting: 37.6   Smokeless tobacco: Never  Vaping Use   Vaping Use: Never used  Substance and Sexual Activity   Alcohol use: No    Alcohol/week: 0.0 standard drinks of alcohol   Drug use: No   Sexual activity: Yes    Birth control/protection: None  Other Topics Concern   Not on file  Social History Narrative   Full time.    Social Determinants of Health   Financial Resource Strain: Not on file  Food Insecurity: Not on file  Transportation Needs: Not on file  Physical Activity: Not on file  Stress: Not on file  Social Connections: Not on file  Intimate Partner Violence: Not on file    Family History  Problem Relation Age of Onset   Hypertension Mother    Kidney disease Mother    Heart disease Father    Kidney  disease Other        Objective: Vitals:   06/17/22 1435  BP: 107/60  Pulse: 77     Physical Exam  Lab Results:  PSA No results found for: "PSA" No results found for: "TESTOSTERONE"  Results for orders placed or performed in visit on 06/17/22 (from the past 24 hour(s))  Urinalysis, Routine w reflex microscopic     Status: Abnormal   Collection Time: 06/17/22  2:40 PM  Result Value Ref Range   Specific Gravity, UA 1.020 1.005 - 1.030   pH, UA 5.0 5.0 - 7.5   Color, UA Yellow Yellow   Appearance Ur Clear Clear   Leukocytes,UA Negative Negative   Protein,UA 2+ (A) Negative/Trace   Glucose, UA Negative Negative   Ketones, UA 1+ (A) Negative   RBC, UA Negative Negative   Bilirubin, UA Negative Negative    Urobilinogen, Ur 0.2 0.2 - 1.0 mg/dL   Nitrite, UA Negative Negative   Microscopic Examination See below:    Narrative   Performed at:  [facility] [address], [place], [place]  [id number] Lab Director: Mina Marble MT, Phone:  [phone]  Microscopic Examination     Status: None   Collection Time: 06/17/22  2:40 PM   Urine  Result Value Ref Range   WBC, UA None seen 0 - 5 /hpf   RBC, Urine None seen 0 - 2 /hpf   Epithelial Cells (non renal) None seen 0 - 10 /hpf   Renal Epithel, UA None seen None seen /hpf   Mucus, UA Present Not Estab.   Bacteria, UA Few None seen/Few   Narrative   Performed at:  [facility] [address], [place], [place]  [id number] Lab Director: Hunter, Phone:  [phone]    UA is unremarkable.  Lab Results  Component Value Date   PSA1 0.9 06/10/2022   PSA1 0.8 12/10/2021   PSA1 0.6 04/02/2021     Studies/Results:  CLINICAL DATA:  Follow-up right-sided renal cell carcinoma, status post right nephrectomy, additional history of prostate cancer   EXAM: CT ABDOMEN AND PELVIS WITHOUT CONTRAST   TECHNIQUE: Multidetector CT imaging of the abdomen and pelvis was performed following the standard protocol without IV contrast.   COMPARISON:  05/28/2020   FINDINGS: Lower chest: No acute abnormality.  Coronary artery calcifications.   Hepatobiliary: No solid liver abnormality is seen. No gallstones, gallbladder wall thickening, or biliary dilatation.   Pancreas: Unremarkable. No pancreatic ductal dilatation or surrounding inflammatory changes.   Spleen: Normal in size without significant abnormality.   Adrenals/Urinary Tract: Adrenal glands are unremarkable. Redemonstrated postoperative findings right nephrectomy. No evidence of recurrent soft tissue. Partially exophytic benign simple cyst of the left kidney, which is otherwise normal. No hydronephrosis. Thickening of the decompressed urinary  bladder, likely related to chronic outlet obstruction.   Stomach/Bowel: Stomach is within normal limits. Appendix appears normal. No evidence of bowel wall thickening, distention, or inflammatory changes.   Vascular/Lymphatic: Aortic atherosclerosis. No enlarged abdominal or pelvic lymph nodes.   Reproductive: Biopsy marking clips in the prostate.   Other: No abdominal wall hernia or abnormality. No abdominopelvic ascites.   Musculoskeletal: No acute or significant osseous findings.   IMPRESSION: 1. Redemonstrated postoperative findings of right nephrectomy. No noncontrast evidence of recurrent soft tissue or metastatic disease in the abdomen or pelvis. 2. Thickening of the decompressed urinary bladder, likely related to chronic outlet obstruction. 3. Coronary artery disease.   Aortic Atherosclerosis (ICD10-I70.0).  Electronically Signed   By: Delanna Ahmadi M.D.   On: 08/21/2021 09:00      Assessment & Plan: LUTS with Dysuria and history of UTI. UA is clear.   Hx of prostate cancer with prior radiation therapy.  PSA up slightly but still low with a >39yrPSADT.   F/u in 6 months with a PSA.  Consider restaging if the PSA gets to 2.   Incomplete bladder emptying.  He is doing well on tamsulosin.  Hx of right RCCa with prior nephrectomy.  CXR today..   No orders of the defined types were placed in this encounter.     Orders Placed This Encounter  Procedures   Microscopic Examination   DG Chest 2 View    Standing Status:   Future    Standing Expiration Date:   12/18/2022    Order Specific Question:   Reason for Exam (SYMPTOM  OR DIAGNOSIS REQUIRED)    Answer:   history of kidney cancer    Order Specific Question:   [facility]    [facility]   Order Specific Question:   Radiology Contrast Protocol - do NOT remove file path    Answer:   [email or web address]   Urinalysis,  Routine w reflex microscopic   PSA    Standing Status:   Future    Standing Expiration Date:   06/18/2023      Return in about 6 months (around 12/18/2022) for with PSA.   CC: SMonico Blitz MD      JIrine Seal8/09/2022 Patient ID: RSanjuan Dame male   DOB: 4February 17, 1947 76y.o.   MRN: [id number]

## 2022-06-18 DIAGNOSIS — D509 Iron deficiency anemia, unspecified: Secondary | ICD-10-CM | POA: Diagnosis not present

## 2022-06-18 DIAGNOSIS — N2581 Secondary hyperparathyroidism of renal origin: Secondary | ICD-10-CM | POA: Diagnosis not present

## 2022-06-18 DIAGNOSIS — N186 End stage renal disease: Secondary | ICD-10-CM | POA: Diagnosis not present

## 2022-06-18 DIAGNOSIS — D631 Anemia in chronic kidney disease: Secondary | ICD-10-CM | POA: Diagnosis not present

## 2022-06-18 DIAGNOSIS — Z992 Dependence on renal dialysis: Secondary | ICD-10-CM | POA: Diagnosis not present

## 2022-06-18 DIAGNOSIS — N250 Renal osteodystrophy: Secondary | ICD-10-CM | POA: Diagnosis not present

## 2022-06-21 DIAGNOSIS — D631 Anemia in chronic kidney disease: Secondary | ICD-10-CM | POA: Diagnosis not present

## 2022-06-21 DIAGNOSIS — Z992 Dependence on renal dialysis: Secondary | ICD-10-CM | POA: Diagnosis not present

## 2022-06-21 DIAGNOSIS — N2581 Secondary hyperparathyroidism of renal origin: Secondary | ICD-10-CM | POA: Diagnosis not present

## 2022-06-21 DIAGNOSIS — N186 End stage renal disease: Secondary | ICD-10-CM | POA: Diagnosis not present

## 2022-06-21 DIAGNOSIS — N250 Renal osteodystrophy: Secondary | ICD-10-CM | POA: Diagnosis not present

## 2022-06-21 DIAGNOSIS — D509 Iron deficiency anemia, unspecified: Secondary | ICD-10-CM | POA: Diagnosis not present

## 2022-06-23 ENCOUNTER — Ambulatory Visit (INDEPENDENT_AMBULATORY_CARE_PROVIDER_SITE_OTHER): Payer: Medicare Other | Admitting: Vascular Surgery

## 2022-06-23 ENCOUNTER — Ambulatory Visit (HOSPITAL_COMMUNITY)
Admission: RE | Admit: 2022-06-23 | Discharge: 2022-06-23 | Disposition: A | Discharge status: Home / Self Care | Payer: Medicare Other | Source: Ambulatory Visit | Attending: Urology | Admitting: Urology

## 2022-06-23 ENCOUNTER — Encounter: Payer: Self-pay | Admitting: Vascular Surgery

## 2022-06-23 VITALS — BP 111/66 | HR 72 | Temp 95.9°F | Resp 14 | Ht 74.0 in | Wt 221.0 lb

## 2022-06-23 DIAGNOSIS — N2581 Secondary hyperparathyroidism of renal origin: Secondary | ICD-10-CM | POA: Diagnosis not present

## 2022-06-23 DIAGNOSIS — N186 End stage renal disease: Secondary | ICD-10-CM

## 2022-06-23 DIAGNOSIS — Z85528 Personal history of other malignant neoplasm of kidney: Secondary | ICD-10-CM | POA: Diagnosis not present

## 2022-06-23 DIAGNOSIS — Z992 Dependence on renal dialysis: Secondary | ICD-10-CM

## 2022-06-23 DIAGNOSIS — R918 Other nonspecific abnormal finding of lung field: Secondary | ICD-10-CM | POA: Diagnosis not present

## 2022-06-23 DIAGNOSIS — Z452 Encounter for adjustment and management of vascular access device: Secondary | ICD-10-CM | POA: Diagnosis not present

## 2022-06-23 DIAGNOSIS — D509 Iron deficiency anemia, unspecified: Secondary | ICD-10-CM | POA: Diagnosis not present

## 2022-06-23 DIAGNOSIS — N250 Renal osteodystrophy: Secondary | ICD-10-CM | POA: Diagnosis not present

## 2022-06-23 DIAGNOSIS — D631 Anemia in chronic kidney disease: Secondary | ICD-10-CM | POA: Diagnosis not present

## 2022-06-23 NOTE — Progress Notes (Signed)
[facility] [place]  Patient name: Todd Robertson MRN: [id number] DOB: 06-08-1946 Sex: male  REASON FOR VISIT: Follow-up AV graft placement left arm  HPI: Todd Robertson is a 76 y.o. male here today for follow-up.  He is here with his wife.  He has had an occluded left forearm loop AV Gore-Tex graft.  He underwent placement of a new left upper arm graft 181 23.  When he presented for surgery he did have some erythema in the radial aspect of the loop of his old nonfunctional graft.  He is here today for follow-up.  Current Outpatient Medications  Medication Sig Dispense Refill   acetaminophen (TYLENOL) 500 MG tablet Take 1,000 mg by mouth every 6 (six) hours as needed for moderate pain.     albuterol (VENTOLIN HFA) 108 (90 Base) MCG/ACT inhaler Inhale 1-2 puffs into the lungs every 6 (six) hours as needed for wheezing or shortness of breath. 6.7 g 1   allopurinol (ZYLOPRIM) 300 MG tablet Take 300 mg by mouth daily.     atorvastatin (LIPITOR) 40 MG tablet Take 1 tablet (40 mg total) by mouth daily. 90 tablet 3   BREO ELLIPTA 100-25 MCG/ACT AEPB Inhale 1 puff into the lungs daily.     calcium acetate (PHOSLO) 667 MG capsule Take 3 capsules (2,001 mg total) by mouth 3 (three) times daily with meals. (Patient taking differently: Take 1,334 mg by mouth 3 (three) times daily with meals.) 180 capsule 1   cetirizine (ZYRTEC) 10 MG tablet Take 10 mg by mouth daily.     Cholecalciferol (VITAMIN D) 50 MCG (2000 UT) tablet Take 1 tablet (2,000 Units total) by mouth daily. 30 tablet 0   methocarbamol (ROBAXIN) 500 MG tablet Take 500 mg by mouth 3 (three) times a week. Take Sun, Tue, and Thurs (day before dialysis)     metoprolol tartrate (LOPRESSOR) 25 MG tablet Take 0.5 tablets (12.5 mg total) by mouth 2 (two) times daily. (Patient taking differently: Take 12.5 mg by mouth every Monday, Wednesday, and Friday. after dialysis) 90 tablet 3    midodrine (PROAMATINE) 10 MG tablet TAKE 1 TABLET BY MOUTH THREE TIMES DAILY WITH MEALS 270 tablet 2   Multiple Vitamins-Minerals (CENTRUM SILVER 50+MEN) TABS Take 1 tablet by mouth daily.     multivitamin (RENA-VIT) TABS tablet Take 1 tablet by mouth at bedtime. 30 tablet 0   Naphazoline HCl (CLEAR EYES OP) Place 1 drop into both eyes daily.     oxyCODONE-acetaminophen (PERCOCET) 5-325 MG tablet Take 1 tablet by mouth every 6 (six) hours as needed for severe pain. 8 tablet 0   Polyethylene Glycol 3350 (MIRALAX PO) Take 17 g by mouth as needed (constipation).     senna (SENOKOT) 8.6 MG tablet Take 2 tablets by mouth daily as needed for constipation.     tamsulosin (FLOMAX) 0.4 MG CAPS capsule Take 1 capsule (0.4 mg total) by mouth daily. 90 capsule 3   trolamine salicylate (ASPERCREME) 10 % cream Apply 1 Application topically as needed for muscle pain.     No current facility-administered medications for this visit.     PHYSICAL EXAM: Vitals:   06/23/22 1439  BP: 111/66  Pulse: 72  Resp: 14  Temp: (!) 95.9 F (35.5 C)  TempSrc: Temporal  SpO2: 98%  Weight: 221 lb (100.2 kg)  Height: '6\' 2"'$  (1.88 m)    GENERAL: The patient is a well-nourished male, in no acute distress. The vital signs are documented  above. He has a excellent bruit in his left upper arm graft.  Interestingly he does have bruit in the radial limb of his old occluded graft.  As feel that this is transmitted bruit.  This is completely stops with compression of his upper arm graft.  He does have an area that spontaneously opened on the distal radial aspect of his loop graft.  There is no purulence and erythema has resolved.  He does appear to have fluid around his left upper arm graft with probable serous fluid collection around this.  MEDICAL ISSUES: Stable overall.  I am pleased with the resolution on his nonfunctional forearm graft.  I explained that we would need resolution of the serous fluid around his upper arm  graft for use.  He is using his right IJ catheter without difficulty.  I will see him again in 3 weeks for continued follow-up   [name], MD FACS [facility] [phone]  Note: Portions of this report may have been transcribed using voice recognition software.  Every effort has been made to ensure accuracy; however, inadvertent computerized transcription errors may still be present.

## 2022-06-24 ENCOUNTER — Ambulatory Visit: Payer: Medicare Other | Admitting: Urology

## 2022-06-24 DIAGNOSIS — L110 Acquired keratosis follicularis: Secondary | ICD-10-CM | POA: Diagnosis not present

## 2022-06-24 DIAGNOSIS — E1140 Type 2 diabetes mellitus with diabetic neuropathy, unspecified: Secondary | ICD-10-CM | POA: Diagnosis not present

## 2022-06-24 DIAGNOSIS — B351 Tinea unguium: Secondary | ICD-10-CM | POA: Diagnosis not present

## 2022-06-24 DIAGNOSIS — E1151 Type 2 diabetes mellitus with diabetic peripheral angiopathy without gangrene: Secondary | ICD-10-CM | POA: Diagnosis not present

## 2022-06-25 DIAGNOSIS — N2581 Secondary hyperparathyroidism of renal origin: Secondary | ICD-10-CM | POA: Diagnosis not present

## 2022-06-25 DIAGNOSIS — Z992 Dependence on renal dialysis: Secondary | ICD-10-CM | POA: Diagnosis not present

## 2022-06-25 DIAGNOSIS — N186 End stage renal disease: Secondary | ICD-10-CM | POA: Diagnosis not present

## 2022-06-25 DIAGNOSIS — N250 Renal osteodystrophy: Secondary | ICD-10-CM | POA: Diagnosis not present

## 2022-06-25 DIAGNOSIS — D631 Anemia in chronic kidney disease: Secondary | ICD-10-CM | POA: Diagnosis not present

## 2022-06-25 DIAGNOSIS — D509 Iron deficiency anemia, unspecified: Secondary | ICD-10-CM | POA: Diagnosis not present

## 2022-06-28 ENCOUNTER — Telehealth: Payer: Self-pay

## 2022-06-28 DIAGNOSIS — Z992 Dependence on renal dialysis: Secondary | ICD-10-CM | POA: Diagnosis not present

## 2022-06-28 DIAGNOSIS — D631 Anemia in chronic kidney disease: Secondary | ICD-10-CM | POA: Diagnosis not present

## 2022-06-28 DIAGNOSIS — N2581 Secondary hyperparathyroidism of renal origin: Secondary | ICD-10-CM | POA: Diagnosis not present

## 2022-06-28 DIAGNOSIS — N186 End stage renal disease: Secondary | ICD-10-CM | POA: Diagnosis not present

## 2022-06-28 DIAGNOSIS — N250 Renal osteodystrophy: Secondary | ICD-10-CM | POA: Diagnosis not present

## 2022-06-28 DIAGNOSIS — D509 Iron deficiency anemia, unspecified: Secondary | ICD-10-CM | POA: Diagnosis not present

## 2022-06-28 NOTE — Telephone Encounter (Signed)
Made patient wife aware that the CXR looks okay and no evidence of cancer per Dr. Jeffie Pollock. Wife voiced understanding.

## 2022-06-28 NOTE — Telephone Encounter (Signed)
-----   Message from Irine Seal, MD sent at 06/28/2022 12:10 PM EDT ----- Cxr looks ok.  No evidence of cancer.  ----- Message ----- From: Sherrilyn Rist, CMA Sent: 06/25/2022  10:57 AM EDT To: Irine Seal, MD  Please Review

## 2022-06-29 DIAGNOSIS — E1165 Type 2 diabetes mellitus with hyperglycemia: Secondary | ICD-10-CM | POA: Diagnosis not present

## 2022-06-29 DIAGNOSIS — I10 Essential (primary) hypertension: Secondary | ICD-10-CM | POA: Diagnosis not present

## 2022-06-29 DIAGNOSIS — N186 End stage renal disease: Secondary | ICD-10-CM | POA: Diagnosis not present

## 2022-06-29 DIAGNOSIS — Z6828 Body mass index (BMI) 28.0-28.9, adult: Secondary | ICD-10-CM | POA: Diagnosis not present

## 2022-06-29 DIAGNOSIS — Z299 Encounter for prophylactic measures, unspecified: Secondary | ICD-10-CM | POA: Diagnosis not present

## 2022-06-29 DIAGNOSIS — J449 Chronic obstructive pulmonary disease, unspecified: Secondary | ICD-10-CM | POA: Diagnosis not present

## 2022-06-30 DIAGNOSIS — D509 Iron deficiency anemia, unspecified: Secondary | ICD-10-CM | POA: Diagnosis not present

## 2022-06-30 DIAGNOSIS — D631 Anemia in chronic kidney disease: Secondary | ICD-10-CM | POA: Diagnosis not present

## 2022-06-30 DIAGNOSIS — N250 Renal osteodystrophy: Secondary | ICD-10-CM | POA: Diagnosis not present

## 2022-06-30 DIAGNOSIS — N2581 Secondary hyperparathyroidism of renal origin: Secondary | ICD-10-CM | POA: Diagnosis not present

## 2022-06-30 DIAGNOSIS — Z992 Dependence on renal dialysis: Secondary | ICD-10-CM | POA: Diagnosis not present

## 2022-06-30 DIAGNOSIS — N186 End stage renal disease: Secondary | ICD-10-CM | POA: Diagnosis not present

## 2022-07-02 DIAGNOSIS — D631 Anemia in chronic kidney disease: Secondary | ICD-10-CM | POA: Diagnosis not present

## 2022-07-02 DIAGNOSIS — Z992 Dependence on renal dialysis: Secondary | ICD-10-CM | POA: Diagnosis not present

## 2022-07-02 DIAGNOSIS — N186 End stage renal disease: Secondary | ICD-10-CM | POA: Diagnosis not present

## 2022-07-02 DIAGNOSIS — D509 Iron deficiency anemia, unspecified: Secondary | ICD-10-CM | POA: Diagnosis not present

## 2022-07-02 DIAGNOSIS — N250 Renal osteodystrophy: Secondary | ICD-10-CM | POA: Diagnosis not present

## 2022-07-02 DIAGNOSIS — N2581 Secondary hyperparathyroidism of renal origin: Secondary | ICD-10-CM | POA: Diagnosis not present

## 2022-07-05 DIAGNOSIS — D631 Anemia in chronic kidney disease: Secondary | ICD-10-CM | POA: Diagnosis not present

## 2022-07-05 DIAGNOSIS — N250 Renal osteodystrophy: Secondary | ICD-10-CM | POA: Diagnosis not present

## 2022-07-05 DIAGNOSIS — N2581 Secondary hyperparathyroidism of renal origin: Secondary | ICD-10-CM | POA: Diagnosis not present

## 2022-07-05 DIAGNOSIS — Z992 Dependence on renal dialysis: Secondary | ICD-10-CM | POA: Diagnosis not present

## 2022-07-05 DIAGNOSIS — D509 Iron deficiency anemia, unspecified: Secondary | ICD-10-CM | POA: Diagnosis not present

## 2022-07-05 DIAGNOSIS — N186 End stage renal disease: Secondary | ICD-10-CM | POA: Diagnosis not present

## 2022-07-07 DIAGNOSIS — D631 Anemia in chronic kidney disease: Secondary | ICD-10-CM | POA: Diagnosis not present

## 2022-07-07 DIAGNOSIS — N2581 Secondary hyperparathyroidism of renal origin: Secondary | ICD-10-CM | POA: Diagnosis not present

## 2022-07-07 DIAGNOSIS — Z992 Dependence on renal dialysis: Secondary | ICD-10-CM | POA: Diagnosis not present

## 2022-07-07 DIAGNOSIS — D509 Iron deficiency anemia, unspecified: Secondary | ICD-10-CM | POA: Diagnosis not present

## 2022-07-07 DIAGNOSIS — N186 End stage renal disease: Secondary | ICD-10-CM | POA: Diagnosis not present

## 2022-07-07 DIAGNOSIS — N250 Renal osteodystrophy: Secondary | ICD-10-CM | POA: Diagnosis not present

## 2022-07-08 DIAGNOSIS — N186 End stage renal disease: Secondary | ICD-10-CM | POA: Diagnosis not present

## 2022-07-08 DIAGNOSIS — Z992 Dependence on renal dialysis: Secondary | ICD-10-CM | POA: Diagnosis not present

## 2022-07-09 DIAGNOSIS — N250 Renal osteodystrophy: Secondary | ICD-10-CM | POA: Diagnosis not present

## 2022-07-09 DIAGNOSIS — D509 Iron deficiency anemia, unspecified: Secondary | ICD-10-CM | POA: Diagnosis not present

## 2022-07-09 DIAGNOSIS — Z992 Dependence on renal dialysis: Secondary | ICD-10-CM | POA: Diagnosis not present

## 2022-07-09 DIAGNOSIS — N2581 Secondary hyperparathyroidism of renal origin: Secondary | ICD-10-CM | POA: Diagnosis not present

## 2022-07-09 DIAGNOSIS — N186 End stage renal disease: Secondary | ICD-10-CM | POA: Diagnosis not present

## 2022-07-09 DIAGNOSIS — D631 Anemia in chronic kidney disease: Secondary | ICD-10-CM | POA: Diagnosis not present

## 2022-07-12 DIAGNOSIS — N186 End stage renal disease: Secondary | ICD-10-CM | POA: Diagnosis not present

## 2022-07-12 DIAGNOSIS — N2581 Secondary hyperparathyroidism of renal origin: Secondary | ICD-10-CM | POA: Diagnosis not present

## 2022-07-12 DIAGNOSIS — Z992 Dependence on renal dialysis: Secondary | ICD-10-CM | POA: Diagnosis not present

## 2022-07-12 DIAGNOSIS — D509 Iron deficiency anemia, unspecified: Secondary | ICD-10-CM | POA: Diagnosis not present

## 2022-07-12 DIAGNOSIS — D631 Anemia in chronic kidney disease: Secondary | ICD-10-CM | POA: Diagnosis not present

## 2022-07-14 ENCOUNTER — Ambulatory Visit (INDEPENDENT_AMBULATORY_CARE_PROVIDER_SITE_OTHER): Payer: Medicare Other | Admitting: Vascular Surgery

## 2022-07-14 ENCOUNTER — Encounter: Payer: Self-pay | Admitting: Vascular Surgery

## 2022-07-14 VITALS — BP 112/65 | HR 69 | Temp 98.4°F | Ht 74.0 in | Wt 221.6 lb

## 2022-07-14 DIAGNOSIS — N186 End stage renal disease: Secondary | ICD-10-CM

## 2022-07-14 DIAGNOSIS — N2581 Secondary hyperparathyroidism of renal origin: Secondary | ICD-10-CM | POA: Diagnosis not present

## 2022-07-14 DIAGNOSIS — D631 Anemia in chronic kidney disease: Secondary | ICD-10-CM | POA: Diagnosis not present

## 2022-07-14 DIAGNOSIS — Z992 Dependence on renal dialysis: Secondary | ICD-10-CM

## 2022-07-14 DIAGNOSIS — D509 Iron deficiency anemia, unspecified: Secondary | ICD-10-CM | POA: Diagnosis not present

## 2022-07-14 NOTE — Progress Notes (Signed)
[facility] [place]  Patient name: Todd Robertson MRN: [id number] DOB: 1946/09/24 Sex: male  REASON FOR VISIT: Follow-up left upper arm AV Gore-Tex graft placement and nonfunctional left forearm loop graft  HPI: Todd Robertson is a 76 y.o. male here today for follow-up.  He had a nonfunctional left forearm loop graft and was admitted for elective left upper arm graft.  On presentation he had some fullness over the radial aspect of his nonfunctional left forearm loop graft.  He had a left upper arm graft placed.  On follow-up he had erosion of the skin and some drainage from his left nonfunctional graft.  There is no graft exposed.  On my last visit with him 3 weeks ago, he had some fluctuance and fullness with apparent serous fluid around his left upper arm graft.  He is here today for follow-up.  Current Outpatient Medications  Medication Sig Dispense Refill   acetaminophen (TYLENOL) 500 MG tablet Take 1,000 mg by mouth every 6 (six) hours as needed for moderate pain.     albuterol (VENTOLIN HFA) 108 (90 Base) MCG/ACT inhaler Inhale 1-2 puffs into the lungs every 6 (six) hours as needed for wheezing or shortness of breath. 6.7 g 1   allopurinol (ZYLOPRIM) 300 MG tablet Take 300 mg by mouth daily.     atorvastatin (LIPITOR) 40 MG tablet Take 1 tablet (40 mg total) by mouth daily. 90 tablet 3   BREO ELLIPTA 100-25 MCG/ACT AEPB Inhale 1 puff into the lungs daily.     calcium acetate (PHOSLO) 667 MG capsule Take 3 capsules (2,001 mg total) by mouth 3 (three) times daily with meals. (Patient taking differently: Take 1,334 mg by mouth 3 (three) times daily with meals.) 180 capsule 1   cetirizine (ZYRTEC) 10 MG tablet Take 10 mg by mouth daily.     Cholecalciferol (VITAMIN D) 50 MCG (2000 UT) tablet Take 1 tablet (2,000 Units total) by mouth daily. 30 tablet 0   methocarbamol (ROBAXIN) 500 MG tablet Take 500 mg by mouth 3 (three) times a  week. Take Sun, Tue, and Thurs (day before dialysis)     metoprolol tartrate (LOPRESSOR) 25 MG tablet Take 0.5 tablets (12.5 mg total) by mouth 2 (two) times daily. (Patient taking differently: Take 12.5 mg by mouth every Monday, Wednesday, and Friday. after dialysis) 90 tablet 3   midodrine (PROAMATINE) 10 MG tablet TAKE 1 TABLET BY MOUTH THREE TIMES DAILY WITH MEALS 270 tablet 2   Multiple Vitamins-Minerals (CENTRUM SILVER 50+MEN) TABS Take 1 tablet by mouth daily.     multivitamin (RENA-VIT) TABS tablet Take 1 tablet by mouth at bedtime. 30 tablet 0   Naphazoline HCl (CLEAR EYES OP) Place 1 drop into both eyes daily.     oxyCODONE-acetaminophen (PERCOCET) 5-325 MG tablet Take 1 tablet by mouth every 6 (six) hours as needed for severe pain. 8 tablet 0   Polyethylene Glycol 3350 (MIRALAX PO) Take 17 g by mouth as needed (constipation).     senna (SENOKOT) 8.6 MG tablet Take 2 tablets by mouth daily as needed for constipation.     tamsulosin (FLOMAX) 0.4 MG CAPS capsule Take 1 capsule (0.4 mg total) by mouth daily. 90 capsule 3   trolamine salicylate (ASPERCREME) 10 % cream Apply 1 Application topically as needed for muscle pain.     No current facility-administered medications for this visit.     PHYSICAL EXAM: Vitals:   07/14/22 1423  BP: 112/65  Pulse: 69  Temp: 98.4 F (36.9 C)  SpO2: 98%  Weight: 221 lb 9.6 oz (100.5 kg)  Height: '6\' 2"'$  (1.88 m)    GENERAL: The patient is a well-nourished male, in no acute distress. The vital signs are documented above. The fullness and fluid around his left upper arm graft has resolved.  He has a excellent thrill in his graft.  He continues to have a area of fluid collection under the area of the radial aspect of his forearm.  He reports that this occasionally will spontaneously drain every day or 2.  MEDICAL ISSUES: Patient still using his catheter without difficulty.  I have recommended that we excised the area of nonfunctional graft which is  continued to have drainage.  Once this has healed he should be able to use his new left upper arm graft   [name], MD FACS [facility] [phone]  Note: Portions of this report may have been transcribed using voice recognition software.  Every effort has been made to ensure accuracy; however, inadvertent computerized transcription errors may still be present.

## 2022-07-15 ENCOUNTER — Other Ambulatory Visit: Payer: Self-pay

## 2022-07-15 DIAGNOSIS — N186 End stage renal disease: Secondary | ICD-10-CM

## 2022-07-16 DIAGNOSIS — D509 Iron deficiency anemia, unspecified: Secondary | ICD-10-CM | POA: Diagnosis not present

## 2022-07-16 DIAGNOSIS — N186 End stage renal disease: Secondary | ICD-10-CM | POA: Diagnosis not present

## 2022-07-16 DIAGNOSIS — Z992 Dependence on renal dialysis: Secondary | ICD-10-CM | POA: Diagnosis not present

## 2022-07-16 DIAGNOSIS — D631 Anemia in chronic kidney disease: Secondary | ICD-10-CM | POA: Diagnosis not present

## 2022-07-16 DIAGNOSIS — N2581 Secondary hyperparathyroidism of renal origin: Secondary | ICD-10-CM | POA: Diagnosis not present

## 2022-07-19 ENCOUNTER — Encounter (HOSPITAL_COMMUNITY)
Admission: RE | Admit: 2022-07-19 | Discharge: 2022-07-19 | Disposition: A | Discharge status: Home / Self Care | Payer: Medicare Other | Source: Ambulatory Visit | Attending: Vascular Surgery | Admitting: Vascular Surgery

## 2022-07-19 ENCOUNTER — Ambulatory Visit (HOSPITAL_COMMUNITY): Payer: Medicare Other | Admitting: Anesthesiology

## 2022-07-19 DIAGNOSIS — D631 Anemia in chronic kidney disease: Secondary | ICD-10-CM | POA: Diagnosis not present

## 2022-07-19 DIAGNOSIS — D509 Iron deficiency anemia, unspecified: Secondary | ICD-10-CM | POA: Diagnosis not present

## 2022-07-19 DIAGNOSIS — N2581 Secondary hyperparathyroidism of renal origin: Secondary | ICD-10-CM | POA: Diagnosis not present

## 2022-07-19 DIAGNOSIS — Z992 Dependence on renal dialysis: Secondary | ICD-10-CM | POA: Diagnosis not present

## 2022-07-19 DIAGNOSIS — N186 End stage renal disease: Secondary | ICD-10-CM | POA: Diagnosis not present

## 2022-07-20 ENCOUNTER — Ambulatory Visit (HOSPITAL_COMMUNITY)
Admission: RE | Admit: 2022-07-20 | Discharge: 2022-07-20 | Disposition: A | Discharge status: Home / Self Care | Payer: Medicare Other | Attending: Vascular Surgery | Admitting: Vascular Surgery

## 2022-07-20 ENCOUNTER — Encounter (HOSPITAL_COMMUNITY): Payer: Self-pay | Admitting: Vascular Surgery

## 2022-07-20 DIAGNOSIS — N186 End stage renal disease: Secondary | ICD-10-CM

## 2022-07-20 NOTE — Anesthesia Preprocedure Evaluation (Deleted)
Anesthesia Evaluation  Patient identified by MRN, date of birth, ID band Patient awake    Reviewed: Allergy & Precautions, NPO status , Patient's Chart, lab work & pertinent test results, reviewed documented beta blocker date and time   History of Anesthesia Complications Negative for: history of anesthetic complications  Airway Mallampati: III  TM Distance: >3 FB Neck ROM: Full    Dental  (+) Dental Advisory Given, Upper Dentures, Lower Dentures   Pulmonary former smoker,    Pulmonary exam normal breath sounds clear to auscultation       Cardiovascular hypertension, Pt. on medications and Pt. on home beta blockers +CHF (Nonischemic cardiomyopathy)  Normal cardiovascular exam+ dysrhythmias Atrial Fibrillation  Rhythm:Regular Rate:Normal  EKG- SR first degree av block, PVCs, PACs, RBBB  ECHO - 11/13/20 1. Left ventricular ejection fraction, by estimation, is 50 to 55%. The left ventricle has normal function. The left ventricle has no regional wall motion abnormalities. There is moderate left ventricular hypertrophy.  Left ventricular diastolic  parameters are consistent with Grade I diastolic dysfunction (impaired relaxation).  2. Right ventricular systolic function is normal. The right ventricular size is normal.  3. Left atrial size was severely dilated.  4. Right atrial size was mildly dilated.  5. The pericardial effusion is circumferential.  6. The mitral valve is normal in structure. Trivial mitral valve regurgitation. No evidence of mitral stenosis.  7. The aortic valve is tricuspid. Aortic valve regurgitation is mild. No aortic stenosis is present.  8. The inferior vena cava is normal in size with greater than 50% respiratory variability, suggesting right atrial pressure of 3 mmHg.    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, nepherectomy)  negative genitourinary   Musculoskeletal  (+) Arthritis , Osteoarthritis,    Abdominal   Peds negative pediatric ROS (+)  Hematology  (+) Blood dyscrasia, anemia ,   Anesthesia Other Findings   Reproductive/Obstetrics negative OB ROS                           Anesthesia Physical Anesthesia Plan  ASA: 3  Anesthesia Plan: General   Post-op Pain Management: Minimal or no pain anticipated   Induction: Intravenous  PONV Risk Score and Plan: TIVA  Airway Management Planned: Nasal Cannula and Natural Airway  Additional Equipment:   Intra-op Plan:   Post-operative Plan:   Informed Consent: I have reviewed the patients History and Physical, chart, labs and discussed the procedure including the risks, benefits and alternatives for the proposed anesthesia with the patient or authorized representative who has indicated his/her understanding and acceptance.     Dental advisory given  Plan Discussed with: CRNA and Surgeon  Anesthesia Plan Comments: (Possible GA with airway was explained.)       Anesthesia Quick Evaluation

## 2022-07-21 ENCOUNTER — Other Ambulatory Visit: Payer: Self-pay | Admitting: Cardiology

## 2022-07-21 DIAGNOSIS — Z992 Dependence on renal dialysis: Secondary | ICD-10-CM | POA: Diagnosis not present

## 2022-07-21 DIAGNOSIS — D631 Anemia in chronic kidney disease: Secondary | ICD-10-CM | POA: Diagnosis not present

## 2022-07-21 DIAGNOSIS — N2581 Secondary hyperparathyroidism of renal origin: Secondary | ICD-10-CM | POA: Diagnosis not present

## 2022-07-21 DIAGNOSIS — N186 End stage renal disease: Secondary | ICD-10-CM | POA: Diagnosis not present

## 2022-07-21 DIAGNOSIS — D509 Iron deficiency anemia, unspecified: Secondary | ICD-10-CM | POA: Diagnosis not present

## 2022-07-23 DIAGNOSIS — D631 Anemia in chronic kidney disease: Secondary | ICD-10-CM | POA: Diagnosis not present

## 2022-07-23 DIAGNOSIS — N2581 Secondary hyperparathyroidism of renal origin: Secondary | ICD-10-CM | POA: Diagnosis not present

## 2022-07-23 DIAGNOSIS — D509 Iron deficiency anemia, unspecified: Secondary | ICD-10-CM | POA: Diagnosis not present

## 2022-07-23 DIAGNOSIS — Z992 Dependence on renal dialysis: Secondary | ICD-10-CM | POA: Diagnosis not present

## 2022-07-23 DIAGNOSIS — N186 End stage renal disease: Secondary | ICD-10-CM | POA: Diagnosis not present

## 2022-07-26 ENCOUNTER — Encounter (HOSPITAL_COMMUNITY)
Admission: RE | Admit: 2022-07-26 | Discharge: 2022-07-26 | Disposition: A | Discharge status: Home / Self Care | Payer: Medicare Other | Source: Home / Self Care | Attending: Vascular Surgery | Admitting: Vascular Surgery

## 2022-07-26 DIAGNOSIS — D509 Iron deficiency anemia, unspecified: Secondary | ICD-10-CM | POA: Diagnosis not present

## 2022-07-26 DIAGNOSIS — N186 End stage renal disease: Secondary | ICD-10-CM | POA: Diagnosis not present

## 2022-07-26 DIAGNOSIS — N2581 Secondary hyperparathyroidism of renal origin: Secondary | ICD-10-CM | POA: Diagnosis not present

## 2022-07-26 DIAGNOSIS — D631 Anemia in chronic kidney disease: Secondary | ICD-10-CM | POA: Diagnosis not present

## 2022-07-26 DIAGNOSIS — Z992 Dependence on renal dialysis: Secondary | ICD-10-CM | POA: Diagnosis not present

## 2022-07-27 ENCOUNTER — Ambulatory Visit (HOSPITAL_COMMUNITY): Payer: Medicare Other | Admitting: Anesthesiology

## 2022-07-27 ENCOUNTER — Other Ambulatory Visit: Payer: Self-pay

## 2022-07-27 ENCOUNTER — Encounter (HOSPITAL_COMMUNITY)
Admission: RE | Disposition: A | Discharge status: Home / Self Care | Payer: Self-pay | Source: Home / Self Care | Attending: Vascular Surgery

## 2022-07-27 ENCOUNTER — Ambulatory Visit (HOSPITAL_BASED_OUTPATIENT_CLINIC_OR_DEPARTMENT_OTHER): Payer: Medicare Other | Admitting: Anesthesiology

## 2022-07-27 ENCOUNTER — Ambulatory Visit (HOSPITAL_COMMUNITY)
Admission: RE | Admit: 2022-07-27 | Discharge: 2022-07-27 | Disposition: A | Discharge status: Home / Self Care | Payer: Medicare Other | Attending: Vascular Surgery | Admitting: Vascular Surgery

## 2022-07-27 DIAGNOSIS — I10 Essential (primary) hypertension: Secondary | ICD-10-CM | POA: Insufficient documentation

## 2022-07-27 DIAGNOSIS — I120 Hypertensive chronic kidney disease with stage 5 chronic kidney disease or end stage renal disease: Secondary | ICD-10-CM | POA: Insufficient documentation

## 2022-07-27 DIAGNOSIS — Y832 Surgical operation with anastomosis, bypass or graft as the cause of abnormal reaction of the patient, or of later complication, without mention of misadventure at the time of the procedure: Secondary | ICD-10-CM | POA: Insufficient documentation

## 2022-07-27 DIAGNOSIS — T827XXA Infection and inflammatory reaction due to other cardiac and vascular devices, implants and grafts, initial encounter: Secondary | ICD-10-CM | POA: Diagnosis not present

## 2022-07-27 DIAGNOSIS — Z992 Dependence on renal dialysis: Secondary | ICD-10-CM | POA: Insufficient documentation

## 2022-07-27 DIAGNOSIS — N185 Chronic kidney disease, stage 5: Secondary | ICD-10-CM | POA: Diagnosis not present

## 2022-07-27 DIAGNOSIS — D631 Anemia in chronic kidney disease: Secondary | ICD-10-CM | POA: Diagnosis not present

## 2022-07-27 DIAGNOSIS — Z87891 Personal history of nicotine dependence: Secondary | ICD-10-CM | POA: Diagnosis not present

## 2022-07-27 DIAGNOSIS — E1122 Type 2 diabetes mellitus with diabetic chronic kidney disease: Secondary | ICD-10-CM

## 2022-07-27 DIAGNOSIS — N186 End stage renal disease: Secondary | ICD-10-CM

## 2022-07-27 HISTORY — PX: AVGG REMOVAL: SHX5153

## 2022-07-27 LAB — POCT I-STAT, CHEM 8
BUN: 47 mg/dL — ABNORMAL HIGH (ref 8–23)
Calcium, Ion: 1.09 mmol/L — ABNORMAL LOW (ref 1.15–1.40)
Chloride: 101 mmol/L (ref 98–111)
Creatinine, Ser: 10.9 mg/dL — ABNORMAL HIGH (ref 0.61–1.24)
Glucose, Bld: 94 mg/dL (ref 70–99)
HCT: 34 % — ABNORMAL LOW (ref 39.0–52.0)
Hemoglobin: 11.6 g/dL — ABNORMAL LOW (ref 13.0–17.0)
Potassium: 5 mmol/L (ref 3.5–5.1)
Sodium: 139 mmol/L (ref 135–145)
TCO2: 24 mmol/L (ref 22–32)

## 2022-07-27 SURGERY — REMOVAL OF ARTERIOVENOUS GORETEX GRAFT (AVGG)
Anesthesia: General | Site: Arm Lower | Laterality: Left

## 2022-07-27 SURGERY — REMOVAL OF ARTERIOVENOUS GORETEX GRAFT (AVGG)
Anesthesia: General | Laterality: Left

## 2022-07-27 MED ORDER — LIDOCAINE-EPINEPHRINE 0.5 %-1:200000 IJ SOLN
INTRAMUSCULAR | Status: DC | PRN
  Administered 2022-07-27: 17 mL

## 2022-07-27 MED ORDER — SODIUM CHLORIDE 0.9 % IV SOLN: INTRAVENOUS | Status: DC

## 2022-07-27 MED ORDER — PHENYLEPHRINE HCL-NACL 20-0.9 MG/250ML-% IV SOLN
INTRAVENOUS | Status: DC | PRN
  Administered 2022-07-27: 50 ug/min via INTRAVENOUS

## 2022-07-27 MED ORDER — PROPOFOL 500 MG/50ML IV EMUL
INTRAVENOUS | Status: DC | PRN
  Administered 2022-07-27: 100 ug/kg/min via INTRAVENOUS

## 2022-07-27 MED ORDER — ONDANSETRON HCL 4 MG/2ML IJ SOLN
INTRAMUSCULAR | Status: AC
  Filled 2022-07-27: qty 2

## 2022-07-27 MED ORDER — PHENYLEPHRINE HCL (PRESSORS) 10 MG/ML IV SOLN
INTRAVENOUS | Status: AC
  Filled 2022-07-27: qty 1

## 2022-07-27 MED ORDER — LIDOCAINE HCL (CARDIAC) PF 100 MG/5ML IV SOSY
PREFILLED_SYRINGE | INTRAVENOUS | Status: DC | PRN
  Administered 2022-07-27: 60 mg via INTRAVENOUS

## 2022-07-27 MED ORDER — CEFAZOLIN SODIUM-DEXTROSE 2-4 GM/100ML-% IV SOLN
2.0000 g | INTRAVENOUS | Status: AC
  Administered 2022-07-27: 2 g via INTRAVENOUS
  Filled 2022-07-27: qty 100

## 2022-07-27 MED ORDER — CHLORHEXIDINE GLUCONATE 0.12 % MT SOLN: 15.0000 mL | Freq: Once | OROMUCOSAL | Status: DC

## 2022-07-27 MED ORDER — ONDANSETRON HCL 4 MG/2ML IJ SOLN
INTRAMUSCULAR | Status: DC | PRN
  Administered 2022-07-27: 4 mg via INTRAVENOUS

## 2022-07-27 MED ORDER — ONDANSETRON HCL 4 MG/2ML IJ SOLN: 4.0000 mg | Freq: Once | INTRAMUSCULAR | Status: DC | PRN

## 2022-07-27 MED ORDER — HEPARIN 6000 UNIT IRRIGATION SOLUTION
Status: DC | PRN
  Administered 2022-07-27: 1

## 2022-07-27 MED ORDER — CHLORHEXIDINE GLUCONATE 4 % EX LIQD: 60.0000 mL | Freq: Once | CUTANEOUS | Status: DC

## 2022-07-27 MED ORDER — PHENYLEPHRINE HCL (PRESSORS) 10 MG/ML IV SOLN
INTRAVENOUS | Status: DC | PRN
  Administered 2022-07-27 (×5): 160 ug via INTRAVENOUS

## 2022-07-27 MED ORDER — OXYCODONE-ACETAMINOPHEN 5-325 MG PO TABS: 1.0000 | ORAL_TABLET | Freq: Four times a day (QID) | ORAL | 0 refills | Status: DC | PRN

## 2022-07-27 MED ORDER — FENTANYL CITRATE PF 50 MCG/ML IJ SOSY: 25.0000 ug | PREFILLED_SYRINGE | INTRAMUSCULAR | Status: DC | PRN

## 2022-07-27 MED ORDER — LACTATED RINGERS IV SOLN: INTRAVENOUS | Status: DC

## 2022-07-27 MED ORDER — 0.9 % SODIUM CHLORIDE (POUR BTL) OPTIME
TOPICAL | Status: DC | PRN
  Administered 2022-07-27: 1000 mL

## 2022-07-27 MED ORDER — OXYCODONE HCL 5 MG/5ML PO SOLN: 5.0000 mg | Freq: Once | ORAL | Status: AC | PRN

## 2022-07-27 MED ORDER — OXYCODONE HCL 5 MG PO TABS
5.0000 mg | ORAL_TABLET | Freq: Once | ORAL | Status: AC | PRN
  Administered 2022-07-27: 5 mg via ORAL
  Filled 2022-07-27: qty 1

## 2022-07-27 MED ORDER — FENTANYL CITRATE (PF) 100 MCG/2ML IJ SOLN
INTRAMUSCULAR | Status: AC
  Filled 2022-07-27: qty 2

## 2022-07-27 MED ORDER — FENTANYL CITRATE (PF) 100 MCG/2ML IJ SOLN
INTRAMUSCULAR | Status: DC | PRN
  Administered 2022-07-27 (×2): 50 ug via INTRAVENOUS

## 2022-07-27 MED ORDER — ORAL CARE MOUTH RINSE: 15.0000 mL | Freq: Once | OROMUCOSAL | Status: DC

## 2022-07-27 MED ORDER — EPHEDRINE SULFATE (PRESSORS) 50 MG/ML IJ SOLN
INTRAMUSCULAR | Status: DC | PRN
  Administered 2022-07-27: 10 mg via INTRAVENOUS

## 2022-07-27 MED ORDER — PHENYLEPHRINE HCL-NACL 20-0.9 MG/250ML-% IV SOLN
INTRAVENOUS | Status: AC
  Filled 2022-07-27: qty 250

## 2022-07-27 SURGICAL SUPPLY — 44 items
ADH SKN CLS LQ APL DERMABOND (GAUZE/BANDAGES/DRESSINGS) ×1
ARMBAND PINK RESTRICT EXTREMIT (MISCELLANEOUS) ×1 IMPLANT
BAG HAMPER (MISCELLANEOUS) ×1 IMPLANT
BNDG ELASTIC 4X5.8 VLCR STR LF (GAUZE/BANDAGES/DRESSINGS) IMPLANT
BNDG GAUZE DERMACEA FLUFF 4 (GAUZE/BANDAGES/DRESSINGS) IMPLANT
BNDG GZE DERMACEA 4 6PLY (GAUZE/BANDAGES/DRESSINGS) ×1
CANNULA VESSEL 3MM 2 BLNT TIP (CANNULA) ×1 IMPLANT
CLIP LIGATING EXTRA MED SLVR (CLIP) ×1 IMPLANT
CLIP LIGATING EXTRA SM BLUE (MISCELLANEOUS) ×1 IMPLANT
COVER LIGHT HANDLE STERIS (MISCELLANEOUS) ×2 IMPLANT
COVER MAYO STAND XLG (MISCELLANEOUS) ×1 IMPLANT
DECANTER SPIKE VIAL GLASS SM (MISCELLANEOUS) ×1 IMPLANT
DERMABOND ADVANCED .7 DNX6 (GAUZE/BANDAGES/DRESSINGS) IMPLANT
DRSG TEGADERM 4X4.75 (GAUZE/BANDAGES/DRESSINGS) IMPLANT
ELECT REM PT RETURN 9FT ADLT (ELECTROSURGICAL) ×1
ELECTRODE REM PT RTRN 9FT ADLT (ELECTROSURGICAL) ×1 IMPLANT
GAUZE SPONGE 4X4 12PLY STRL (GAUZE/BANDAGES/DRESSINGS) ×2 IMPLANT
GLOVE BIOGEL PI IND STRL 7.0 (GLOVE) ×2 IMPLANT
GLOVE SURG MICRO LTX SZ7.5 (GLOVE) ×1 IMPLANT
GOWN STRL REUS W/TWL LRG LVL3 (GOWN DISPOSABLE) ×3 IMPLANT
IV NS 500ML (IV SOLUTION) ×2
IV NS 500ML BAXH (IV SOLUTION) ×2 IMPLANT
KIT BLADEGUARD II DBL (SET/KITS/TRAYS/PACK) ×1 IMPLANT
KIT TURNOVER KIT A (KITS) ×1 IMPLANT
MANIFOLD NEPTUNE II (INSTRUMENTS) ×1 IMPLANT
MARKER SKIN DUAL TIP RULER LAB (MISCELLANEOUS) ×2 IMPLANT
NDL HYPO 18GX1.5 BLUNT FILL (NEEDLE) ×1 IMPLANT
NEEDLE HYPO 18GX1.5 BLUNT FILL (NEEDLE) ×1 IMPLANT
NS IRRIG 1000ML POUR BTL (IV SOLUTION) IMPLANT
PACK CV ACCESS (CUSTOM PROCEDURE TRAY) ×1 IMPLANT
PAD ARMBOARD 7.5X6 YLW CONV (MISCELLANEOUS) ×1 IMPLANT
SET BASIN LINEN APH (SET/KITS/TRAYS/PACK) ×1 IMPLANT
SOL PREP POV-IOD 4OZ 10% (MISCELLANEOUS) ×1 IMPLANT
SOL PREP PROV IODINE SCRUB 4OZ (MISCELLANEOUS) ×1 IMPLANT
SPONGE T-LAP 18X18 ~~LOC~~+RFID (SPONGE) ×1 IMPLANT
SUT ETHILON 3 0 PS 1 (SUTURE) IMPLANT
SUT PROLENE 6 0 CC (SUTURE) ×1 IMPLANT
SUT SILK 2 0 FSL 18 (SUTURE) ×1 IMPLANT
SUT VIC AB 3-0 SH 27 (SUTURE) ×2
SUT VIC AB 3-0 SH 27X BRD (SUTURE) ×1 IMPLANT
SYR 10ML LL (SYRINGE) ×1 IMPLANT
SYR 50ML LL SCALE MARK (SYRINGE) ×1 IMPLANT
SYR CONTROL 10ML LL (SYRINGE) ×1 IMPLANT
UNDERPAD 30X36 HEAVY ABSORB (UNDERPADS AND DIAPERS) ×1 IMPLANT

## 2022-07-27 NOTE — Op Note (Signed)
    OPERATIVE REPORT  DATE OF SURGERY: 07/27/2022  PATIENT: Todd Robertson, 76 y.o. male MRN: [id number]  DOB: 11-01-1946  PRE-OPERATIVE DIAGNOSIS: Infection of nonfunctional left forearm loop graft  POST-OPERATIVE DIAGNOSIS:  Same  PROCEDURE: Removal of left forearm loop Gore-Tex graft  SURGEON:  Curt Jews, M.D.  PHYSICIAN ASSISTANT: Vevelyn Royals, RNFA  The assistant was needed for exposure and to expedite the case  ANESTHESIA: MAC  EBL: per anesthesia record  Total I/O In: 400 [I.V.:300; IV Piggyback:100] Out: 10 [Blood:10]  BLOOD ADMINISTERED: none  DRAINS: none  SPECIMEN: none  COUNTS CORRECT:  YES  PATIENT DISPOSITION:  PACU - hemodynamically stable  PROCEDURE DETAILS: Patient was taken everyplace [place] to the area of the left arm was prepped draped usual sterile fashion.  Patient had an area over the radial aspect of his distal forearm loop with continued drainage.  3 separate incisions were made over the graft and then on the left portion.  This was near the arterial limb near the venous anastomosis and also on the ulnar aspect of the more distal loop of the graft.  The graft was mobilized circumferentially through these incisions.  A small cuff of graft was left at the arterial anastomosis and the nonfunctional graft was oversewn with 6-0 Prolene suture.  On the venous anastomosis there were 2 covered stents across the venous anastomosis.  This was removed in its entirety and the small cuff of vein was left on the venous anastomosis and the graft was oversewn with 6-0 Prolene suture.  The graft was transected above and below the involved portion.  The 3 wounds were irrigated with saline and hemostasis was obtained with electrocautery.  The 3 wounds were closed with 3-0 Vicryl in the subcutaneous and subcuticular tissue.  Finally look using local anesthesia incision was made over the involved area.  There was a sinus tract down to the graft.  There was no obvious pus  but there was some poorly incorporated area with some relation tissue present.  This was all debrided and the remaining portion of the graft was removed in its entirety.  An ellipse of skin was used around the sinus track and this was removed.  The wound was irrigated with saline and hemostasis was obtained electrocautery.  This wound was closed with several 3-0 nylon mattress sutures.  Sterile dressing Curlex and Ace wrap were applied and the patient was transferred to the recovery room in stable condition   [name], M.D., [facility] 07/27/2022 10:09 AM  Note: Portions of this report may have been transcribed using voice recognition software.  Every effort has been made to ensure accuracy; however, inadvertent computerized transcription errors may still be present.

## 2022-07-27 NOTE — Transfer of Care (Signed)
Immediate Anesthesia Transfer of Care Note  Patient: Todd Robertson  Procedure(s) Performed: EXCISION OF INFECTED PORTION OF LEFT FOREARM ARTERIOVENOUS GORETEX GRAFT ([place]) (Left: Arm Lower)  Patient Location: PACU  Anesthesia Type:General  Level of Consciousness: awake, alert , oriented and patient cooperative  Airway & Oxygen Therapy: Patient Spontanous Breathing  Post-op Assessment: Report given to RN, Post -op Vital signs reviewed and stable and Patient moving all extremities X 4  Post vital signs: Reviewed and stable  Last Vitals:  Vitals Value Taken Time  BP 157/65 07/27/22 1007  Temp 36.9 C 07/27/22 1007  Pulse 75 07/27/22 1007  Resp 14 07/27/22 1007  SpO2 100 % 07/27/22 1007    Last Pain:  Vitals:   07/27/22 1007  TempSrc: Oral  PainSc: 7       Patients Stated Pain Goal: 5 (22/48/25 0037)  Complications: No notable events documented.

## 2022-07-27 NOTE — Anesthesia Postprocedure Evaluation (Signed)
Anesthesia Post Note  Patient: Todd Robertson  Procedure(s) Performed: EXCISION OF INFECTED PORTION OF LEFT FOREARM ARTERIOVENOUS GORETEX GRAFT ([place]) (Left: Arm Lower)  Patient location during evaluation: Phase II Anesthesia Type: General Level of consciousness: awake Pain management: pain level controlled Vital Signs Assessment: post-procedure vital signs reviewed and stable Respiratory status: spontaneous breathing and respiratory function stable Cardiovascular status: blood pressure returned to baseline and stable Postop Assessment: no headache and no apparent nausea or vomiting Anesthetic complications: no Comments: Late entry   No notable events documented.   Last Vitals:  Vitals:   07/27/22 0801 07/27/22 1007  BP: 139/74 (!) 157/65  Pulse: 77 75  Resp: 18 14  Temp: 36.9 C 36.9 C  SpO2: 98% 100%    Last Pain:  Vitals:   07/27/22 1007  TempSrc: Oral  PainSc: [name]

## 2022-07-27 NOTE — Anesthesia Preprocedure Evaluation (Signed)
Anesthesia Evaluation  Patient identified by MRN, date of birth, ID band Patient awake    Reviewed: Allergy & Precautions, H&P , NPO status , Patient's Chart, lab work & pertinent test results, reviewed documented beta blocker date and time   Airway Mallampati: II  TM Distance: >3 FB Neck ROM: full    Dental no notable dental hx.    Pulmonary neg pulmonary ROS, former smoker,    Pulmonary exam normal breath sounds clear to auscultation       Cardiovascular Exercise Tolerance: Good hypertension, negative cardio ROS   Rhythm:regular Rate:Normal     Neuro/Psych negative neurological ROS  negative psych ROS   GI/Hepatic negative GI ROS, Neg liver ROS,   Endo/Other  negative endocrine ROSdiabetes  Renal/GU ESRF and DialysisRenal disease  negative genitourinary   Musculoskeletal   Abdominal   Peds  Hematology  (+) Blood dyscrasia, anemia ,   Anesthesia Other Findings   Reproductive/Obstetrics negative OB ROS                             Anesthesia Physical  Anesthesia Plan  ASA: 3  Anesthesia Plan: General   Post-op Pain Management:    Induction:   PONV Risk Score and Plan: Ondansetron and Propofol infusion  Airway Management Planned:   Additional Equipment:   Intra-op Plan:   Post-operative Plan:   Informed Consent: I have reviewed the patients History and Physical, chart, labs and discussed the procedure including the risks, benefits and alternatives for the proposed anesthesia with the patient or authorized representative who has indicated his/her understanding and acceptance.     Dental Advisory Given  Plan Discussed with: CRNA  Anesthesia Plan Comments:         Anesthesia Quick Evaluation

## 2022-07-27 NOTE — Discharge Instructions (Addendum)
[facility]  Discharge Instructions  AV Fistula or Graft Surgery for Dialysis Access  Please refer to the following instructions for your post-procedure care. Your surgeon or physician assistant will discuss any changes with you.  Activity  You may drive the day following your surgery, if you are comfortable and no longer taking prescription pain medication. Resume full activity as the soreness in your incision resolves.  Bathing/Showering  You may shower after you go home. Keep your incision dry for 48 hours. Do not soak in a bathtub, hot tub, or swim until the incision heals completely. You may not shower if you have a hemodialysis catheter.  Incision Care  Clean your incision with mild soap and water after 48 hours. Pat the area dry with a clean towel. You do not need a bandage unless otherwise instructed. Do not apply any ointments or creams to your incision. You may have skin glue on your incision. Do not peel it off. It will come off on its own in about one week. Your arm may swell a bit after surgery. To reduce swelling use pillows to elevate your arm so it is above your heart. Your doctor will tell you if you need to lightly wrap your arm with an ACE bandage.  Diet  Resume your normal diet. There are not special food restrictions following this procedure. In order to heal from your surgery, it is CRITICAL to get adequate nutrition. Your body requires vitamins, minerals, and protein. Vegetables are the best source of vitamins and minerals. Vegetables also provide the perfect balance of protein. Processed food has little nutritional value, so try to avoid this.  Medications  Resume taking all of your medications. If your incision is causing pain, you may take over-the counter pain relievers such as acetaminophen (Tylenol). If you were prescribed a stronger pain medication, please be aware these medications can cause nausea and constipation. Prevent  nausea by taking the medication with a snack or meal. Avoid constipation by drinking plenty of fluids and eating foods with high amount of fiber, such as fruits, vegetables, and grains.  Do not take Tylenol if you are taking prescription pain medications.  Follow up Your surgeon may want to see you in the office following your access surgery. If so, this will be arranged at the time of your surgery.  Please call us immediately for any of the following conditions:  Increased pain, redness, drainage (pus) from your incision site Fever of 101 degrees or higher Severe or worsening pain at your incision site Hand pain or numbness.  Reduce your risk of vascular disease:  Stop smoking. If you would like help, call [facility] at 1-800-QUIT-NOW 916 294 7876) or [facility] at [place] your cholesterol Maintain a desired weight Control your diabetes Keep your blood pressure down  Dialysis  It will take several weeks to several months for your new dialysis access to be ready for use. Your surgeon will determine when it is okay to use it. Your nephrologist will continue to direct your dialysis. You can continue to use your Permcath until your new access is ready for use.   07/27/2022 Todd Robertson [id number] 07/04/1946  Surgeon(s): Early, Arvilla Meres, MD  Procedure(s): EXCISION OF INFECTED PORTION OF LEFT FOREARM ARTERIOVENOUS GORETEX GRAFT ([place])   May stick graft immediately   May stick graft on designated area only:    Do not stick graft for 2 weeks    If you have any questions, please  call the office at [phone].

## 2022-07-28 DIAGNOSIS — N186 End stage renal disease: Secondary | ICD-10-CM | POA: Diagnosis not present

## 2022-07-28 DIAGNOSIS — Z992 Dependence on renal dialysis: Secondary | ICD-10-CM | POA: Diagnosis not present

## 2022-07-28 DIAGNOSIS — N2581 Secondary hyperparathyroidism of renal origin: Secondary | ICD-10-CM | POA: Diagnosis not present

## 2022-07-28 DIAGNOSIS — D631 Anemia in chronic kidney disease: Secondary | ICD-10-CM | POA: Diagnosis not present

## 2022-07-28 DIAGNOSIS — D509 Iron deficiency anemia, unspecified: Secondary | ICD-10-CM | POA: Diagnosis not present

## 2022-07-29 DIAGNOSIS — H35033 Hypertensive retinopathy, bilateral: Secondary | ICD-10-CM | POA: Diagnosis not present

## 2022-07-30 ENCOUNTER — Telehealth: Payer: Self-pay

## 2022-07-30 DIAGNOSIS — N186 End stage renal disease: Secondary | ICD-10-CM | POA: Diagnosis not present

## 2022-07-30 DIAGNOSIS — Z992 Dependence on renal dialysis: Secondary | ICD-10-CM | POA: Diagnosis not present

## 2022-07-30 DIAGNOSIS — D509 Iron deficiency anemia, unspecified: Secondary | ICD-10-CM | POA: Diagnosis not present

## 2022-07-30 DIAGNOSIS — D631 Anemia in chronic kidney disease: Secondary | ICD-10-CM | POA: Diagnosis not present

## 2022-07-30 DIAGNOSIS — N2581 Secondary hyperparathyroidism of renal origin: Secondary | ICD-10-CM | POA: Diagnosis not present

## 2022-07-30 NOTE — Telephone Encounter (Signed)
Pt's wife, [name], called stating that they took the bandage off 48 hours after the procedure as instructed and every time they change the bandage, it is bleeding.  Reviewed pt's chart, returned call for clarification, two identifiers used. She stated that the bandage is getting "stuck" on the wound and they are pulling it off. That's when there is some trickling of blood. After they apply pressure, it stops quickly. Informed her that it sounded like they were pulling off a scabbed area with each bandage change. She was instructed to get the bandage wet if it was sticking to the wound and let the water help release it. She could also use a non-stick pad or a small amount of vaseline to lessen the possibility of adhering to the wound. Instructed to go to ED if there was any severe bleeding. Confirmed understanding.

## 2022-07-31 DIAGNOSIS — Z23 Encounter for immunization: Secondary | ICD-10-CM | POA: Diagnosis not present

## 2022-08-02 DIAGNOSIS — N186 End stage renal disease: Secondary | ICD-10-CM | POA: Diagnosis not present

## 2022-08-02 DIAGNOSIS — D509 Iron deficiency anemia, unspecified: Secondary | ICD-10-CM | POA: Diagnosis not present

## 2022-08-02 DIAGNOSIS — D631 Anemia in chronic kidney disease: Secondary | ICD-10-CM | POA: Diagnosis not present

## 2022-08-02 DIAGNOSIS — Z992 Dependence on renal dialysis: Secondary | ICD-10-CM | POA: Diagnosis not present

## 2022-08-02 DIAGNOSIS — N2581 Secondary hyperparathyroidism of renal origin: Secondary | ICD-10-CM | POA: Diagnosis not present

## 2022-08-03 ENCOUNTER — Encounter (HOSPITAL_COMMUNITY): Payer: Self-pay | Admitting: Vascular Surgery

## 2022-08-04 DIAGNOSIS — D509 Iron deficiency anemia, unspecified: Secondary | ICD-10-CM | POA: Diagnosis not present

## 2022-08-04 DIAGNOSIS — D631 Anemia in chronic kidney disease: Secondary | ICD-10-CM | POA: Diagnosis not present

## 2022-08-04 DIAGNOSIS — N186 End stage renal disease: Secondary | ICD-10-CM | POA: Diagnosis not present

## 2022-08-04 DIAGNOSIS — N2581 Secondary hyperparathyroidism of renal origin: Secondary | ICD-10-CM | POA: Diagnosis not present

## 2022-08-04 DIAGNOSIS — Z992 Dependence on renal dialysis: Secondary | ICD-10-CM | POA: Diagnosis not present

## 2022-08-06 DIAGNOSIS — D631 Anemia in chronic kidney disease: Secondary | ICD-10-CM | POA: Diagnosis not present

## 2022-08-06 DIAGNOSIS — N186 End stage renal disease: Secondary | ICD-10-CM | POA: Diagnosis not present

## 2022-08-06 DIAGNOSIS — N2581 Secondary hyperparathyroidism of renal origin: Secondary | ICD-10-CM | POA: Diagnosis not present

## 2022-08-06 DIAGNOSIS — Z992 Dependence on renal dialysis: Secondary | ICD-10-CM | POA: Diagnosis not present

## 2022-08-06 DIAGNOSIS — D509 Iron deficiency anemia, unspecified: Secondary | ICD-10-CM | POA: Diagnosis not present

## 2022-08-07 DIAGNOSIS — Z992 Dependence on renal dialysis: Secondary | ICD-10-CM | POA: Diagnosis not present

## 2022-08-07 DIAGNOSIS — N186 End stage renal disease: Secondary | ICD-10-CM | POA: Diagnosis not present

## 2022-08-09 DIAGNOSIS — N250 Renal osteodystrophy: Secondary | ICD-10-CM | POA: Diagnosis not present

## 2022-08-09 DIAGNOSIS — N2581 Secondary hyperparathyroidism of renal origin: Secondary | ICD-10-CM | POA: Diagnosis not present

## 2022-08-09 DIAGNOSIS — D631 Anemia in chronic kidney disease: Secondary | ICD-10-CM | POA: Diagnosis not present

## 2022-08-09 DIAGNOSIS — Z992 Dependence on renal dialysis: Secondary | ICD-10-CM | POA: Diagnosis not present

## 2022-08-09 DIAGNOSIS — D509 Iron deficiency anemia, unspecified: Secondary | ICD-10-CM | POA: Diagnosis not present

## 2022-08-09 DIAGNOSIS — N186 End stage renal disease: Secondary | ICD-10-CM | POA: Diagnosis not present

## 2022-08-11 ENCOUNTER — Ambulatory Visit (INDEPENDENT_AMBULATORY_CARE_PROVIDER_SITE_OTHER): Payer: Medicare Other | Admitting: Vascular Surgery

## 2022-08-11 ENCOUNTER — Encounter: Payer: Self-pay | Admitting: Vascular Surgery

## 2022-08-11 VITALS — BP 116/65 | HR 67 | Temp 98.2°F | Ht 74.0 in | Wt 222.6 lb

## 2022-08-11 DIAGNOSIS — Z992 Dependence on renal dialysis: Secondary | ICD-10-CM | POA: Diagnosis not present

## 2022-08-11 DIAGNOSIS — N250 Renal osteodystrophy: Secondary | ICD-10-CM | POA: Diagnosis not present

## 2022-08-11 DIAGNOSIS — N2581 Secondary hyperparathyroidism of renal origin: Secondary | ICD-10-CM | POA: Diagnosis not present

## 2022-08-11 DIAGNOSIS — D631 Anemia in chronic kidney disease: Secondary | ICD-10-CM | POA: Diagnosis not present

## 2022-08-11 DIAGNOSIS — N186 End stage renal disease: Secondary | ICD-10-CM

## 2022-08-11 DIAGNOSIS — D509 Iron deficiency anemia, unspecified: Secondary | ICD-10-CM | POA: Diagnosis not present

## 2022-08-11 NOTE — H&P (View-Only) (Signed)
[facility]  Patient name: Todd Robertson MRN: [id number] DOB: 19-Oct-1946 Sex: male  REASON FOR VISIT: Follow-up removal of left forearm loop AV Gore-Tex graft on 07/27/2022  HPI: Todd Robertson is a 76 y.o. male here today for follow-up.  He had an area of erosion and a nonfunctional left forearm loop graft.  His graft was removed in its entirety on 07/27/2022.  He does have a functioning left upper arm AV graft which was placed on 06/08/2022.  He has a tunneled hemodialysis catheter that he is currently using.  Current Outpatient Medications  Medication Sig Dispense Refill   acetaminophen (TYLENOL) 500 MG tablet Take 1,000 mg by mouth every 6 (six) hours as needed for moderate pain.     albuterol (VENTOLIN HFA) 108 (90 Base) MCG/ACT inhaler Inhale 1-2 puffs into the lungs every 6 (six) hours as needed for wheezing or shortness of breath. (Patient taking differently: Inhale 1-2 puffs into the lungs daily.) 6.7 g 1   allopurinol (ZYLOPRIM) 300 MG tablet Take 300 mg by mouth daily.     atorvastatin (LIPITOR) 40 MG tablet Take 1 tablet (40 mg total) by mouth daily. 90 tablet 3   BREO ELLIPTA 100-25 MCG/ACT AEPB Inhale 1 puff into the lungs daily.     calcium acetate (PHOSLO) 667 MG capsule Take 3 capsules (2,001 mg total) by mouth 3 (three) times daily with meals. (Patient taking differently: Take 1,334 mg by mouth 3 (three) times daily with meals.) 180 capsule 1   cetirizine (ZYRTEC) 10 MG tablet Take 10 mg by mouth daily.     Cholecalciferol (VITAMIN D) 50 MCG (2000 UT) tablet Take 1 tablet (2,000 Units total) by mouth daily. 30 tablet 0   methocarbamol (ROBAXIN) 500 MG tablet Take 500 mg by mouth 3 (three) times a week. Take Sun, Tue, and Thurs (day before dialysis)     metoprolol tartrate (LOPRESSOR) 25 MG tablet Take 0.5 tablets (12.5 mg total) by mouth 2 (two) times daily. (Patient taking differently: Take 12.5 mg by mouth 2  (two) times daily. after dialysis) 90 tablet 3   midodrine (PROAMATINE) 10 MG tablet TAKE 1 TABLET BY MOUTH THREE TIMES DAILY WITH MEALS 270 tablet 3   Multiple Vitamins-Minerals (CENTRUM SILVER 50+MEN) TABS Take 1 tablet by mouth daily.     Naphazoline HCl (CLEAR EYES OP) Place 1 drop into both eyes daily.     oxyCODONE-acetaminophen (PERCOCET) 5-325 MG tablet Take 1 tablet by mouth every 6 (six) hours as needed for severe pain. 8 tablet 0   oxyCODONE-acetaminophen (PERCOCET) 5-325 MG tablet Take 1 tablet by mouth every 6 (six) hours as needed for severe pain. 12 tablet 0   Polyethylene Glycol 3350 (MIRALAX PO) Take 17 g by mouth daily as needed (constipation).     senna (SENOKOT) 8.6 MG tablet Take 1-2 tablets by mouth daily as needed for constipation.     tamsulosin (FLOMAX) 0.4 MG CAPS capsule Take 1 capsule (0.4 mg total) by mouth daily. 90 capsule 3   trolamine salicylate (ASPERCREME) 10 % cream Apply 1 Application topically as needed for muscle pain.     No current facility-administered medications for this visit.     PHYSICAL EXAM: Vitals:   08/11/22 1443  BP: 116/65  Pulse: 67  Temp: 98.2 F (36.8 C)  SpO2: 98%  Weight: 222 lb 9.6 oz (101 kg)  Height: '6\' 2"'$  (1.88 m)    GENERAL: The patient is a well-nourished male, in  no acute distress. The vital signs are documented above. All surgical incisions are well-healed.  He has excellent thrill in his left upper arm graft with no evidence of fluid accumulation  MEDICAL ISSUES: I removed the nylon mattress sutures at the area of the infected eroded portion of his graft.  I feel that he is able to begin using his left upper arm graft at any time.  I am available for removal of his catheter if convenient when the time is appropriate.  Please call our office if you would like for me to remove the catheter.  Otherwise we will see him on an as-needed basis   [name], MD FACS [facility] Tel  [phone]  Note: Portions of this report may have been transcribed using voice recognition software.  Every effort has been made to ensure accuracy; however, inadvertent computerized transcription errors may still be present.

## 2022-08-11 NOTE — Progress Notes (Signed)
[facility] [place]  Patient name: Todd Robertson MRN: [id number] DOB: 1945/12/13 Sex: male  REASON FOR VISIT: Follow-up removal of left forearm loop AV Gore-Tex graft on 07/27/2022  HPI: Todd Robertson is a 75 y.o. male here today for follow-up.  He had an area of erosion and a nonfunctional left forearm loop graft.  His graft was removed in its entirety on 07/27/2022.  He does have a functioning left upper arm AV graft which was placed on 06/08/2022.  He has a tunneled hemodialysis catheter that he is currently using.  Current Outpatient Medications  Medication Sig Dispense Refill   acetaminophen (TYLENOL) 500 MG tablet Take 1,000 mg by mouth every 6 (six) hours as needed for moderate pain.     albuterol (VENTOLIN HFA) 108 (90 Base) MCG/ACT inhaler Inhale 1-2 puffs into the lungs every 6 (six) hours as needed for wheezing or shortness of breath. (Patient taking differently: Inhale 1-2 puffs into the lungs daily.) 6.7 g 1   allopurinol (ZYLOPRIM) 300 MG tablet Take 300 mg by mouth daily.     atorvastatin (LIPITOR) 40 MG tablet Take 1 tablet (40 mg total) by mouth daily. 90 tablet 3   BREO ELLIPTA 100-25 MCG/ACT AEPB Inhale 1 puff into the lungs daily.     calcium acetate (PHOSLO) 667 MG capsule Take 3 capsules (2,001 mg total) by mouth 3 (three) times daily with meals. (Patient taking differently: Take 1,334 mg by mouth 3 (three) times daily with meals.) 180 capsule 1   cetirizine (ZYRTEC) 10 MG tablet Take 10 mg by mouth daily.     Cholecalciferol (VITAMIN D) 50 MCG (2000 UT) tablet Take 1 tablet (2,000 Units total) by mouth daily. 30 tablet 0   methocarbamol (ROBAXIN) 500 MG tablet Take 500 mg by mouth 3 (three) times a week. Take Sun, Tue, and Thurs (day before dialysis)     metoprolol tartrate (LOPRESSOR) 25 MG tablet Take 0.5 tablets (12.5 mg total) by mouth 2 (two) times daily. (Patient taking differently: Take 12.5 mg by mouth 2  (two) times daily. after dialysis) 90 tablet 3   midodrine (PROAMATINE) 10 MG tablet TAKE 1 TABLET BY MOUTH THREE TIMES DAILY WITH MEALS 270 tablet 3   Multiple Vitamins-Minerals (CENTRUM SILVER 50+MEN) TABS Take 1 tablet by mouth daily.     Naphazoline HCl (CLEAR EYES OP) Place 1 drop into both eyes daily.     oxyCODONE-acetaminophen (PERCOCET) 5-325 MG tablet Take 1 tablet by mouth every 6 (six) hours as needed for severe pain. 8 tablet 0   oxyCODONE-acetaminophen (PERCOCET) 5-325 MG tablet Take 1 tablet by mouth every 6 (six) hours as needed for severe pain. 12 tablet 0   Polyethylene Glycol 3350 (MIRALAX PO) Take 17 g by mouth daily as needed (constipation).     senna (SENOKOT) 8.6 MG tablet Take 1-2 tablets by mouth daily as needed for constipation.     tamsulosin (FLOMAX) 0.4 MG CAPS capsule Take 1 capsule (0.4 mg total) by mouth daily. 90 capsule 3   trolamine salicylate (ASPERCREME) 10 % cream Apply 1 Application topically as needed for muscle pain.     No current facility-administered medications for this visit.     PHYSICAL EXAM: Vitals:   08/11/22 1443  BP: 116/65  Pulse: 67  Temp: 98.2 F (36.8 C)  SpO2: 98%  Weight: 222 lb 9.6 oz (101 kg)  Height: '6\' 2"'$  (1.88 m)    GENERAL: The patient is a well-nourished male, in  no acute distress. The vital signs are documented above. All surgical incisions are well-healed.  He has excellent thrill in his left upper arm graft with no evidence of fluid accumulation  MEDICAL ISSUES: I removed the nylon mattress sutures at the area of the infected eroded portion of his graft.  I feel that he is able to begin using his left upper arm graft at any time.  I am available for removal of his catheter if convenient when the time is appropriate.  Please call our office if you would like for me to remove the catheter.  Otherwise we will see him on an as-needed basis   [name], MD FACS [facility] Tel  [phone]  Note: Portions of this report may have been transcribed using voice recognition software.  Every effort has been made to ensure accuracy; however, inadvertent computerized transcription errors may still be present.

## 2022-08-13 DIAGNOSIS — D509 Iron deficiency anemia, unspecified: Secondary | ICD-10-CM | POA: Diagnosis not present

## 2022-08-13 DIAGNOSIS — N186 End stage renal disease: Secondary | ICD-10-CM | POA: Diagnosis not present

## 2022-08-13 DIAGNOSIS — N250 Renal osteodystrophy: Secondary | ICD-10-CM | POA: Diagnosis not present

## 2022-08-13 DIAGNOSIS — N2581 Secondary hyperparathyroidism of renal origin: Secondary | ICD-10-CM | POA: Diagnosis not present

## 2022-08-13 DIAGNOSIS — Z992 Dependence on renal dialysis: Secondary | ICD-10-CM | POA: Diagnosis not present

## 2022-08-13 DIAGNOSIS — D631 Anemia in chronic kidney disease: Secondary | ICD-10-CM | POA: Diagnosis not present

## 2022-08-16 DIAGNOSIS — D509 Iron deficiency anemia, unspecified: Secondary | ICD-10-CM | POA: Diagnosis not present

## 2022-08-16 DIAGNOSIS — N186 End stage renal disease: Secondary | ICD-10-CM | POA: Diagnosis not present

## 2022-08-16 DIAGNOSIS — N2581 Secondary hyperparathyroidism of renal origin: Secondary | ICD-10-CM | POA: Diagnosis not present

## 2022-08-16 DIAGNOSIS — D631 Anemia in chronic kidney disease: Secondary | ICD-10-CM | POA: Diagnosis not present

## 2022-08-16 DIAGNOSIS — N250 Renal osteodystrophy: Secondary | ICD-10-CM | POA: Diagnosis not present

## 2022-08-16 DIAGNOSIS — Z992 Dependence on renal dialysis: Secondary | ICD-10-CM | POA: Diagnosis not present

## 2022-08-18 DIAGNOSIS — D631 Anemia in chronic kidney disease: Secondary | ICD-10-CM | POA: Diagnosis not present

## 2022-08-18 DIAGNOSIS — N186 End stage renal disease: Secondary | ICD-10-CM | POA: Diagnosis not present

## 2022-08-18 DIAGNOSIS — N250 Renal osteodystrophy: Secondary | ICD-10-CM | POA: Diagnosis not present

## 2022-08-18 DIAGNOSIS — Z992 Dependence on renal dialysis: Secondary | ICD-10-CM | POA: Diagnosis not present

## 2022-08-18 DIAGNOSIS — N2581 Secondary hyperparathyroidism of renal origin: Secondary | ICD-10-CM | POA: Diagnosis not present

## 2022-08-18 DIAGNOSIS — D509 Iron deficiency anemia, unspecified: Secondary | ICD-10-CM | POA: Diagnosis not present

## 2022-08-20 DIAGNOSIS — N2581 Secondary hyperparathyroidism of renal origin: Secondary | ICD-10-CM | POA: Diagnosis not present

## 2022-08-20 DIAGNOSIS — N186 End stage renal disease: Secondary | ICD-10-CM | POA: Diagnosis not present

## 2022-08-20 DIAGNOSIS — Z992 Dependence on renal dialysis: Secondary | ICD-10-CM | POA: Diagnosis not present

## 2022-08-20 DIAGNOSIS — D631 Anemia in chronic kidney disease: Secondary | ICD-10-CM | POA: Diagnosis not present

## 2022-08-20 DIAGNOSIS — D509 Iron deficiency anemia, unspecified: Secondary | ICD-10-CM | POA: Diagnosis not present

## 2022-08-20 DIAGNOSIS — N250 Renal osteodystrophy: Secondary | ICD-10-CM | POA: Diagnosis not present

## 2022-08-23 DIAGNOSIS — D509 Iron deficiency anemia, unspecified: Secondary | ICD-10-CM | POA: Diagnosis not present

## 2022-08-23 DIAGNOSIS — Z992 Dependence on renal dialysis: Secondary | ICD-10-CM | POA: Diagnosis not present

## 2022-08-23 DIAGNOSIS — N186 End stage renal disease: Secondary | ICD-10-CM | POA: Diagnosis not present

## 2022-08-23 DIAGNOSIS — D631 Anemia in chronic kidney disease: Secondary | ICD-10-CM | POA: Diagnosis not present

## 2022-08-23 DIAGNOSIS — N2581 Secondary hyperparathyroidism of renal origin: Secondary | ICD-10-CM | POA: Diagnosis not present

## 2022-08-23 DIAGNOSIS — N250 Renal osteodystrophy: Secondary | ICD-10-CM | POA: Diagnosis not present

## 2022-08-24 ENCOUNTER — Telehealth (HOSPITAL_COMMUNITY): Payer: Self-pay | Admitting: *Deleted

## 2022-08-24 NOTE — Telephone Encounter (Signed)
Received fax from Dr. Theador Hawthorne for CVC removal.

## 2022-08-25 ENCOUNTER — Other Ambulatory Visit: Payer: Self-pay

## 2022-08-25 DIAGNOSIS — Z992 Dependence on renal dialysis: Secondary | ICD-10-CM | POA: Diagnosis not present

## 2022-08-25 DIAGNOSIS — N250 Renal osteodystrophy: Secondary | ICD-10-CM | POA: Diagnosis not present

## 2022-08-25 DIAGNOSIS — D509 Iron deficiency anemia, unspecified: Secondary | ICD-10-CM | POA: Diagnosis not present

## 2022-08-25 DIAGNOSIS — N2581 Secondary hyperparathyroidism of renal origin: Secondary | ICD-10-CM | POA: Diagnosis not present

## 2022-08-25 DIAGNOSIS — D631 Anemia in chronic kidney disease: Secondary | ICD-10-CM | POA: Diagnosis not present

## 2022-08-25 DIAGNOSIS — N186 End stage renal disease: Secondary | ICD-10-CM | POA: Diagnosis not present

## 2022-08-27 DIAGNOSIS — N2581 Secondary hyperparathyroidism of renal origin: Secondary | ICD-10-CM | POA: Diagnosis not present

## 2022-08-27 DIAGNOSIS — N250 Renal osteodystrophy: Secondary | ICD-10-CM | POA: Diagnosis not present

## 2022-08-27 DIAGNOSIS — N186 End stage renal disease: Secondary | ICD-10-CM | POA: Diagnosis not present

## 2022-08-27 DIAGNOSIS — D631 Anemia in chronic kidney disease: Secondary | ICD-10-CM | POA: Diagnosis not present

## 2022-08-27 DIAGNOSIS — Z992 Dependence on renal dialysis: Secondary | ICD-10-CM | POA: Diagnosis not present

## 2022-08-27 DIAGNOSIS — D509 Iron deficiency anemia, unspecified: Secondary | ICD-10-CM | POA: Diagnosis not present

## 2022-08-30 DIAGNOSIS — D631 Anemia in chronic kidney disease: Secondary | ICD-10-CM | POA: Diagnosis not present

## 2022-08-30 DIAGNOSIS — N2581 Secondary hyperparathyroidism of renal origin: Secondary | ICD-10-CM | POA: Diagnosis not present

## 2022-08-30 DIAGNOSIS — D509 Iron deficiency anemia, unspecified: Secondary | ICD-10-CM | POA: Diagnosis not present

## 2022-08-30 DIAGNOSIS — N186 End stage renal disease: Secondary | ICD-10-CM | POA: Diagnosis not present

## 2022-08-30 DIAGNOSIS — Z992 Dependence on renal dialysis: Secondary | ICD-10-CM | POA: Diagnosis not present

## 2022-08-30 DIAGNOSIS — N250 Renal osteodystrophy: Secondary | ICD-10-CM | POA: Diagnosis not present

## 2022-09-01 DIAGNOSIS — N250 Renal osteodystrophy: Secondary | ICD-10-CM | POA: Diagnosis not present

## 2022-09-01 DIAGNOSIS — Z992 Dependence on renal dialysis: Secondary | ICD-10-CM | POA: Diagnosis not present

## 2022-09-01 DIAGNOSIS — D509 Iron deficiency anemia, unspecified: Secondary | ICD-10-CM | POA: Diagnosis not present

## 2022-09-01 DIAGNOSIS — N186 End stage renal disease: Secondary | ICD-10-CM | POA: Diagnosis not present

## 2022-09-01 DIAGNOSIS — D631 Anemia in chronic kidney disease: Secondary | ICD-10-CM | POA: Diagnosis not present

## 2022-09-01 DIAGNOSIS — N2581 Secondary hyperparathyroidism of renal origin: Secondary | ICD-10-CM | POA: Diagnosis not present

## 2022-09-03 DIAGNOSIS — N186 End stage renal disease: Secondary | ICD-10-CM | POA: Diagnosis not present

## 2022-09-03 DIAGNOSIS — D509 Iron deficiency anemia, unspecified: Secondary | ICD-10-CM | POA: Diagnosis not present

## 2022-09-03 DIAGNOSIS — N2581 Secondary hyperparathyroidism of renal origin: Secondary | ICD-10-CM | POA: Diagnosis not present

## 2022-09-03 DIAGNOSIS — N250 Renal osteodystrophy: Secondary | ICD-10-CM | POA: Diagnosis not present

## 2022-09-03 DIAGNOSIS — D631 Anemia in chronic kidney disease: Secondary | ICD-10-CM | POA: Diagnosis not present

## 2022-09-03 DIAGNOSIS — Z992 Dependence on renal dialysis: Secondary | ICD-10-CM | POA: Diagnosis not present

## 2022-09-06 ENCOUNTER — Other Ambulatory Visit: Payer: Self-pay | Admitting: *Deleted

## 2022-09-06 DIAGNOSIS — D509 Iron deficiency anemia, unspecified: Secondary | ICD-10-CM | POA: Diagnosis not present

## 2022-09-06 DIAGNOSIS — N186 End stage renal disease: Secondary | ICD-10-CM | POA: Diagnosis not present

## 2022-09-06 DIAGNOSIS — D631 Anemia in chronic kidney disease: Secondary | ICD-10-CM | POA: Diagnosis not present

## 2022-09-06 DIAGNOSIS — N2581 Secondary hyperparathyroidism of renal origin: Secondary | ICD-10-CM | POA: Diagnosis not present

## 2022-09-06 DIAGNOSIS — N250 Renal osteodystrophy: Secondary | ICD-10-CM | POA: Diagnosis not present

## 2022-09-06 DIAGNOSIS — Z992 Dependence on renal dialysis: Secondary | ICD-10-CM | POA: Diagnosis not present

## 2022-09-06 MED ORDER — ORAL CARE MOUTH RINSE: 15.0000 mL | Freq: Once | OROMUCOSAL | Status: DC

## 2022-09-06 MED ORDER — SODIUM CHLORIDE 0.9 % IV SOLN: INTRAVENOUS | Status: DC

## 2022-09-06 MED ORDER — CHLORHEXIDINE GLUCONATE 0.12 % MT SOLN: 15.0000 mL | Freq: Once | OROMUCOSAL | Status: DC

## 2022-09-07 ENCOUNTER — Encounter (HOSPITAL_COMMUNITY): Payer: Self-pay | Admitting: Vascular Surgery

## 2022-09-07 ENCOUNTER — Encounter (HOSPITAL_COMMUNITY)
Admission: RE | Disposition: A | Discharge status: Home / Self Care | Payer: Self-pay | Source: Home / Self Care | Attending: Vascular Surgery

## 2022-09-07 ENCOUNTER — Ambulatory Visit (HOSPITAL_COMMUNITY)
Admission: RE | Admit: 2022-09-07 | Discharge: 2022-09-07 | Disposition: A | Discharge status: Home / Self Care | Payer: Medicare Other | Attending: Vascular Surgery | Admitting: Vascular Surgery

## 2022-09-07 DIAGNOSIS — N185 Chronic kidney disease, stage 5: Secondary | ICD-10-CM | POA: Diagnosis not present

## 2022-09-07 DIAGNOSIS — Z992 Dependence on renal dialysis: Secondary | ICD-10-CM | POA: Diagnosis not present

## 2022-09-07 DIAGNOSIS — N186 End stage renal disease: Secondary | ICD-10-CM | POA: Insufficient documentation

## 2022-09-07 HISTORY — PX: REMOVAL OF A DIALYSIS CATHETER: SHX6053

## 2022-09-07 SURGERY — REMOVAL, DIALYSIS CATHETER
Anesthesia: LOCAL | Site: Chest | Laterality: Right

## 2022-09-07 MED ORDER — LIDOCAINE HCL (PF) 1 % IJ SOLN
INTRAMUSCULAR | Status: DC | PRN
  Administered 2022-09-07: 6 mL

## 2022-09-07 MED ORDER — LIDOCAINE HCL (PF) 1 % IJ SOLN
INTRAMUSCULAR | Status: AC
  Filled 2022-09-07: qty 30

## 2022-09-07 SURGICAL SUPPLY — 20 items
ADH SKN CLS APL DERMABOND .7 (GAUZE/BANDAGES/DRESSINGS)
APL PRP STRL LF ISPRP CHG 10.5 (MISCELLANEOUS) ×1
APPLICATOR CHLORAPREP 10.5 ORG (MISCELLANEOUS) ×1 IMPLANT
CLOTH BEACON ORANGE TIMEOUT ST (SAFETY) ×1 IMPLANT
DECANTER SPIKE VIAL GLASS SM (MISCELLANEOUS) ×1 IMPLANT
DERMABOND ADVANCED .7 DNX12 (GAUZE/BANDAGES/DRESSINGS) ×1 IMPLANT
DRAPE HALF SHEET 40X57 (DRAPES) IMPLANT
ELECT REM PT RETURN 9FT ADLT (ELECTROSURGICAL)
ELECTRODE REM PT RTRN 9FT ADLT (ELECTROSURGICAL) ×1 IMPLANT
GLOVE BIOGEL PI IND STRL 7.0 (GLOVE) ×2 IMPLANT
GOWN STRL REUS W/TWL LRG LVL3 (GOWN DISPOSABLE) IMPLANT
NDL HYPO 25X1 1.5 SAFETY (NEEDLE) ×1 IMPLANT
NEEDLE HYPO 25X1 1.5 SAFETY (NEEDLE) ×1 IMPLANT
PENCIL SMOKE EVACUATOR COATED (MISCELLANEOUS) IMPLANT
SPONGE GAUZE 2X2 8PLY STRL LF (GAUZE/BANDAGES/DRESSINGS) ×1 IMPLANT
SUT MNCRL AB 4-0 PS2 18 (SUTURE) ×1 IMPLANT
SUT VIC AB 3-0 SH 27 (SUTURE) ×1
SUT VIC AB 3-0 SH 27X BRD (SUTURE) ×1 IMPLANT
SYR CONTROL 10ML LL (SYRINGE) ×1 IMPLANT
TOWEL OR 17X26 4PK STRL BLUE (TOWEL DISPOSABLE) ×1 IMPLANT

## 2022-09-07 NOTE — Op Note (Signed)
    OPERATIVE REPORT  DATE OF SURGERY: 09/07/2022  PATIENT: Todd Robertson, 76 y.o. male MRN: [id number]  DOB: 07-Nov-1946  PRE-OPERATIVE DIAGNOSIS: End-stage renal disease  POST-OPERATIVE DIAGNOSIS:  Same  PROCEDURE: Removal of right tunneled IJ hemodialysis catheter  SURGEON:  Curt Jews, M.D.  PHYSICIAN ASSISTANT: Nurse  The assistant was needed for exposure and to expedite the case  ANESTHESIA: Local  EBL: per anesthesia record  No intake/output data recorded.  BLOOD ADMINISTERED: none  DRAINS: none  SPECIMEN: none  COUNTS CORRECT:  YES  PATIENT DISPOSITION:  PACU - hemodynamically stable  PROCEDURE DETAILS: The patient was taken to the procedure room at [facility].  The area of the right tunnel catheter was prepped and draped in usual sterile fashion.  The cuff was several centimeters from the exit site.  Using local anesthesia, a small incision was made over the cuff and the cuff was mobilized in its entirety.  The catheter was removed.  Pressure was held for hemostasis.  The incision was closed with a 3-0 subcuticular Vicryl suture.  A sterile dressing was applied and the patient was discharged to home   [name], M.D., [facility] 09/07/2022 9:10 AM  Note: Portions of this report may have been transcribed using voice recognition software.  Every effort has been made to ensure accuracy; however, inadvertent computerized transcription errors may still be present.

## 2022-09-07 NOTE — Discharge Instructions (Signed)
OK to shower  - site may ooze - apply band-aid if needed.

## 2022-09-07 NOTE — Interval H&P Note (Signed)
History and Physical Interval Note:  09/07/2022 8:34 AM  Todd Robertson  has presented today for surgery, with the diagnosis of ESRD.  The various methods of treatment have been discussed with the patient and family. After consideration of risks, benefits and other options for treatment, the patient has consented to  Procedure(s): MINOR REMOVAL OF A TUNNELED DIALYSIS CATHETER (N/A) as a surgical intervention.  The patient's history has been reviewed, patient examined, no change in status, stable for surgery.  I have reviewed the patient's chart and labs.  Questions were answered to the patient's satisfaction.     Curt Jews

## 2022-09-08 DIAGNOSIS — N186 End stage renal disease: Secondary | ICD-10-CM | POA: Diagnosis not present

## 2022-09-08 DIAGNOSIS — N2581 Secondary hyperparathyroidism of renal origin: Secondary | ICD-10-CM | POA: Diagnosis not present

## 2022-09-08 DIAGNOSIS — D509 Iron deficiency anemia, unspecified: Secondary | ICD-10-CM | POA: Diagnosis not present

## 2022-09-08 DIAGNOSIS — N250 Renal osteodystrophy: Secondary | ICD-10-CM | POA: Diagnosis not present

## 2022-09-08 DIAGNOSIS — D631 Anemia in chronic kidney disease: Secondary | ICD-10-CM | POA: Diagnosis not present

## 2022-09-08 DIAGNOSIS — Z992 Dependence on renal dialysis: Secondary | ICD-10-CM | POA: Diagnosis not present

## 2022-09-09 ENCOUNTER — Encounter (HOSPITAL_COMMUNITY): Payer: Self-pay | Admitting: Vascular Surgery

## 2022-09-09 DIAGNOSIS — E1140 Type 2 diabetes mellitus with diabetic neuropathy, unspecified: Secondary | ICD-10-CM | POA: Diagnosis not present

## 2022-09-09 DIAGNOSIS — E1151 Type 2 diabetes mellitus with diabetic peripheral angiopathy without gangrene: Secondary | ICD-10-CM | POA: Diagnosis not present

## 2022-09-09 DIAGNOSIS — L110 Acquired keratosis follicularis: Secondary | ICD-10-CM | POA: Diagnosis not present

## 2022-09-09 DIAGNOSIS — B351 Tinea unguium: Secondary | ICD-10-CM | POA: Diagnosis not present

## 2022-09-10 DIAGNOSIS — N186 End stage renal disease: Secondary | ICD-10-CM | POA: Diagnosis not present

## 2022-09-10 DIAGNOSIS — D631 Anemia in chronic kidney disease: Secondary | ICD-10-CM | POA: Diagnosis not present

## 2022-09-10 DIAGNOSIS — N2581 Secondary hyperparathyroidism of renal origin: Secondary | ICD-10-CM | POA: Diagnosis not present

## 2022-09-10 DIAGNOSIS — N250 Renal osteodystrophy: Secondary | ICD-10-CM | POA: Diagnosis not present

## 2022-09-10 DIAGNOSIS — Z992 Dependence on renal dialysis: Secondary | ICD-10-CM | POA: Diagnosis not present

## 2022-09-10 DIAGNOSIS — D509 Iron deficiency anemia, unspecified: Secondary | ICD-10-CM | POA: Diagnosis not present

## 2022-09-13 DIAGNOSIS — D509 Iron deficiency anemia, unspecified: Secondary | ICD-10-CM | POA: Diagnosis not present

## 2022-09-13 DIAGNOSIS — N2581 Secondary hyperparathyroidism of renal origin: Secondary | ICD-10-CM | POA: Diagnosis not present

## 2022-09-13 DIAGNOSIS — N186 End stage renal disease: Secondary | ICD-10-CM | POA: Diagnosis not present

## 2022-09-13 DIAGNOSIS — N250 Renal osteodystrophy: Secondary | ICD-10-CM | POA: Diagnosis not present

## 2022-09-13 DIAGNOSIS — D631 Anemia in chronic kidney disease: Secondary | ICD-10-CM | POA: Diagnosis not present

## 2022-09-13 DIAGNOSIS — Z992 Dependence on renal dialysis: Secondary | ICD-10-CM | POA: Diagnosis not present

## 2022-09-15 DIAGNOSIS — D509 Iron deficiency anemia, unspecified: Secondary | ICD-10-CM | POA: Diagnosis not present

## 2022-09-15 DIAGNOSIS — N186 End stage renal disease: Secondary | ICD-10-CM | POA: Diagnosis not present

## 2022-09-15 DIAGNOSIS — Z992 Dependence on renal dialysis: Secondary | ICD-10-CM | POA: Diagnosis not present

## 2022-09-15 DIAGNOSIS — N250 Renal osteodystrophy: Secondary | ICD-10-CM | POA: Diagnosis not present

## 2022-09-15 DIAGNOSIS — N2581 Secondary hyperparathyroidism of renal origin: Secondary | ICD-10-CM | POA: Diagnosis not present

## 2022-09-15 DIAGNOSIS — D631 Anemia in chronic kidney disease: Secondary | ICD-10-CM | POA: Diagnosis not present

## 2022-09-17 DIAGNOSIS — D631 Anemia in chronic kidney disease: Secondary | ICD-10-CM | POA: Diagnosis not present

## 2022-09-17 DIAGNOSIS — N250 Renal osteodystrophy: Secondary | ICD-10-CM | POA: Diagnosis not present

## 2022-09-17 DIAGNOSIS — Z992 Dependence on renal dialysis: Secondary | ICD-10-CM | POA: Diagnosis not present

## 2022-09-17 DIAGNOSIS — D509 Iron deficiency anemia, unspecified: Secondary | ICD-10-CM | POA: Diagnosis not present

## 2022-09-17 DIAGNOSIS — N186 End stage renal disease: Secondary | ICD-10-CM | POA: Diagnosis not present

## 2022-09-17 DIAGNOSIS — N2581 Secondary hyperparathyroidism of renal origin: Secondary | ICD-10-CM | POA: Diagnosis not present

## 2022-09-20 DIAGNOSIS — N186 End stage renal disease: Secondary | ICD-10-CM | POA: Diagnosis not present

## 2022-09-20 DIAGNOSIS — N250 Renal osteodystrophy: Secondary | ICD-10-CM | POA: Diagnosis not present

## 2022-09-20 DIAGNOSIS — D631 Anemia in chronic kidney disease: Secondary | ICD-10-CM | POA: Diagnosis not present

## 2022-09-20 DIAGNOSIS — D509 Iron deficiency anemia, unspecified: Secondary | ICD-10-CM | POA: Diagnosis not present

## 2022-09-20 DIAGNOSIS — Z992 Dependence on renal dialysis: Secondary | ICD-10-CM | POA: Diagnosis not present

## 2022-09-20 DIAGNOSIS — N2581 Secondary hyperparathyroidism of renal origin: Secondary | ICD-10-CM | POA: Diagnosis not present

## 2022-09-22 DIAGNOSIS — N250 Renal osteodystrophy: Secondary | ICD-10-CM | POA: Diagnosis not present

## 2022-09-22 DIAGNOSIS — N186 End stage renal disease: Secondary | ICD-10-CM | POA: Diagnosis not present

## 2022-09-22 DIAGNOSIS — Z992 Dependence on renal dialysis: Secondary | ICD-10-CM | POA: Diagnosis not present

## 2022-09-22 DIAGNOSIS — D631 Anemia in chronic kidney disease: Secondary | ICD-10-CM | POA: Diagnosis not present

## 2022-09-22 DIAGNOSIS — N2581 Secondary hyperparathyroidism of renal origin: Secondary | ICD-10-CM | POA: Diagnosis not present

## 2022-09-22 DIAGNOSIS — D509 Iron deficiency anemia, unspecified: Secondary | ICD-10-CM | POA: Diagnosis not present

## 2022-09-24 DIAGNOSIS — N2581 Secondary hyperparathyroidism of renal origin: Secondary | ICD-10-CM | POA: Diagnosis not present

## 2022-09-24 DIAGNOSIS — N250 Renal osteodystrophy: Secondary | ICD-10-CM | POA: Diagnosis not present

## 2022-09-24 DIAGNOSIS — D631 Anemia in chronic kidney disease: Secondary | ICD-10-CM | POA: Diagnosis not present

## 2022-09-24 DIAGNOSIS — N186 End stage renal disease: Secondary | ICD-10-CM | POA: Diagnosis not present

## 2022-09-24 DIAGNOSIS — D509 Iron deficiency anemia, unspecified: Secondary | ICD-10-CM | POA: Diagnosis not present

## 2022-09-24 DIAGNOSIS — Z992 Dependence on renal dialysis: Secondary | ICD-10-CM | POA: Diagnosis not present

## 2022-09-27 DIAGNOSIS — N186 End stage renal disease: Secondary | ICD-10-CM | POA: Diagnosis not present

## 2022-09-27 DIAGNOSIS — Z992 Dependence on renal dialysis: Secondary | ICD-10-CM | POA: Diagnosis not present

## 2022-09-27 DIAGNOSIS — N250 Renal osteodystrophy: Secondary | ICD-10-CM | POA: Diagnosis not present

## 2022-09-27 DIAGNOSIS — D509 Iron deficiency anemia, unspecified: Secondary | ICD-10-CM | POA: Diagnosis not present

## 2022-09-27 DIAGNOSIS — N2581 Secondary hyperparathyroidism of renal origin: Secondary | ICD-10-CM | POA: Diagnosis not present

## 2022-09-27 DIAGNOSIS — D631 Anemia in chronic kidney disease: Secondary | ICD-10-CM | POA: Diagnosis not present

## 2022-09-29 DIAGNOSIS — D631 Anemia in chronic kidney disease: Secondary | ICD-10-CM | POA: Diagnosis not present

## 2022-09-29 DIAGNOSIS — N2581 Secondary hyperparathyroidism of renal origin: Secondary | ICD-10-CM | POA: Diagnosis not present

## 2022-09-29 DIAGNOSIS — N186 End stage renal disease: Secondary | ICD-10-CM | POA: Diagnosis not present

## 2022-09-29 DIAGNOSIS — Z992 Dependence on renal dialysis: Secondary | ICD-10-CM | POA: Diagnosis not present

## 2022-09-29 DIAGNOSIS — D509 Iron deficiency anemia, unspecified: Secondary | ICD-10-CM | POA: Diagnosis not present

## 2022-09-29 DIAGNOSIS — N250 Renal osteodystrophy: Secondary | ICD-10-CM | POA: Diagnosis not present

## 2022-10-01 DIAGNOSIS — D509 Iron deficiency anemia, unspecified: Secondary | ICD-10-CM | POA: Diagnosis not present

## 2022-10-01 DIAGNOSIS — Z992 Dependence on renal dialysis: Secondary | ICD-10-CM | POA: Diagnosis not present

## 2022-10-01 DIAGNOSIS — N2581 Secondary hyperparathyroidism of renal origin: Secondary | ICD-10-CM | POA: Diagnosis not present

## 2022-10-01 DIAGNOSIS — D631 Anemia in chronic kidney disease: Secondary | ICD-10-CM | POA: Diagnosis not present

## 2022-10-01 DIAGNOSIS — N250 Renal osteodystrophy: Secondary | ICD-10-CM | POA: Diagnosis not present

## 2022-10-01 DIAGNOSIS — N186 End stage renal disease: Secondary | ICD-10-CM | POA: Diagnosis not present

## 2022-10-04 DIAGNOSIS — Z992 Dependence on renal dialysis: Secondary | ICD-10-CM | POA: Diagnosis not present

## 2022-10-04 DIAGNOSIS — D631 Anemia in chronic kidney disease: Secondary | ICD-10-CM | POA: Diagnosis not present

## 2022-10-04 DIAGNOSIS — N186 End stage renal disease: Secondary | ICD-10-CM | POA: Diagnosis not present

## 2022-10-04 DIAGNOSIS — N250 Renal osteodystrophy: Secondary | ICD-10-CM | POA: Diagnosis not present

## 2022-10-04 DIAGNOSIS — N2581 Secondary hyperparathyroidism of renal origin: Secondary | ICD-10-CM | POA: Diagnosis not present

## 2022-10-04 DIAGNOSIS — D509 Iron deficiency anemia, unspecified: Secondary | ICD-10-CM | POA: Diagnosis not present

## 2022-10-06 DIAGNOSIS — Z992 Dependence on renal dialysis: Secondary | ICD-10-CM | POA: Diagnosis not present

## 2022-10-06 DIAGNOSIS — N250 Renal osteodystrophy: Secondary | ICD-10-CM | POA: Diagnosis not present

## 2022-10-06 DIAGNOSIS — N186 End stage renal disease: Secondary | ICD-10-CM | POA: Diagnosis not present

## 2022-10-06 DIAGNOSIS — D631 Anemia in chronic kidney disease: Secondary | ICD-10-CM | POA: Diagnosis not present

## 2022-10-06 DIAGNOSIS — D509 Iron deficiency anemia, unspecified: Secondary | ICD-10-CM | POA: Diagnosis not present

## 2022-10-06 DIAGNOSIS — N2581 Secondary hyperparathyroidism of renal origin: Secondary | ICD-10-CM | POA: Diagnosis not present

## 2022-10-07 DIAGNOSIS — N186 End stage renal disease: Secondary | ICD-10-CM | POA: Diagnosis not present

## 2022-10-07 DIAGNOSIS — Z992 Dependence on renal dialysis: Secondary | ICD-10-CM | POA: Diagnosis not present

## 2022-10-08 DIAGNOSIS — N250 Renal osteodystrophy: Secondary | ICD-10-CM | POA: Diagnosis not present

## 2022-10-08 DIAGNOSIS — D631 Anemia in chronic kidney disease: Secondary | ICD-10-CM | POA: Diagnosis not present

## 2022-10-08 DIAGNOSIS — N186 End stage renal disease: Secondary | ICD-10-CM | POA: Diagnosis not present

## 2022-10-08 DIAGNOSIS — D509 Iron deficiency anemia, unspecified: Secondary | ICD-10-CM | POA: Diagnosis not present

## 2022-10-08 DIAGNOSIS — Z992 Dependence on renal dialysis: Secondary | ICD-10-CM | POA: Diagnosis not present

## 2022-10-08 DIAGNOSIS — N2581 Secondary hyperparathyroidism of renal origin: Secondary | ICD-10-CM | POA: Diagnosis not present

## 2022-10-11 DIAGNOSIS — Z992 Dependence on renal dialysis: Secondary | ICD-10-CM | POA: Diagnosis not present

## 2022-10-11 DIAGNOSIS — N2581 Secondary hyperparathyroidism of renal origin: Secondary | ICD-10-CM | POA: Diagnosis not present

## 2022-10-11 DIAGNOSIS — D631 Anemia in chronic kidney disease: Secondary | ICD-10-CM | POA: Diagnosis not present

## 2022-10-11 DIAGNOSIS — N186 End stage renal disease: Secondary | ICD-10-CM | POA: Diagnosis not present

## 2022-10-11 DIAGNOSIS — D509 Iron deficiency anemia, unspecified: Secondary | ICD-10-CM | POA: Diagnosis not present

## 2022-10-13 DIAGNOSIS — D631 Anemia in chronic kidney disease: Secondary | ICD-10-CM | POA: Diagnosis not present

## 2022-10-13 DIAGNOSIS — D509 Iron deficiency anemia, unspecified: Secondary | ICD-10-CM | POA: Diagnosis not present

## 2022-10-13 DIAGNOSIS — N186 End stage renal disease: Secondary | ICD-10-CM | POA: Diagnosis not present

## 2022-10-13 DIAGNOSIS — N2581 Secondary hyperparathyroidism of renal origin: Secondary | ICD-10-CM | POA: Diagnosis not present

## 2022-10-13 DIAGNOSIS — Z992 Dependence on renal dialysis: Secondary | ICD-10-CM | POA: Diagnosis not present

## 2022-10-15 DIAGNOSIS — Z992 Dependence on renal dialysis: Secondary | ICD-10-CM | POA: Diagnosis not present

## 2022-10-15 DIAGNOSIS — N186 End stage renal disease: Secondary | ICD-10-CM | POA: Diagnosis not present

## 2022-10-15 DIAGNOSIS — D509 Iron deficiency anemia, unspecified: Secondary | ICD-10-CM | POA: Diagnosis not present

## 2022-10-15 DIAGNOSIS — N2581 Secondary hyperparathyroidism of renal origin: Secondary | ICD-10-CM | POA: Diagnosis not present

## 2022-10-15 DIAGNOSIS — D631 Anemia in chronic kidney disease: Secondary | ICD-10-CM | POA: Diagnosis not present

## 2022-10-18 DIAGNOSIS — N2581 Secondary hyperparathyroidism of renal origin: Secondary | ICD-10-CM | POA: Diagnosis not present

## 2022-10-18 DIAGNOSIS — D631 Anemia in chronic kidney disease: Secondary | ICD-10-CM | POA: Diagnosis not present

## 2022-10-18 DIAGNOSIS — D509 Iron deficiency anemia, unspecified: Secondary | ICD-10-CM | POA: Diagnosis not present

## 2022-10-18 DIAGNOSIS — N186 End stage renal disease: Secondary | ICD-10-CM | POA: Diagnosis not present

## 2022-10-18 DIAGNOSIS — Z992 Dependence on renal dialysis: Secondary | ICD-10-CM | POA: Diagnosis not present

## 2022-10-20 DIAGNOSIS — N2581 Secondary hyperparathyroidism of renal origin: Secondary | ICD-10-CM | POA: Diagnosis not present

## 2022-10-20 DIAGNOSIS — D631 Anemia in chronic kidney disease: Secondary | ICD-10-CM | POA: Diagnosis not present

## 2022-10-20 DIAGNOSIS — Z992 Dependence on renal dialysis: Secondary | ICD-10-CM | POA: Diagnosis not present

## 2022-10-20 DIAGNOSIS — D509 Iron deficiency anemia, unspecified: Secondary | ICD-10-CM | POA: Diagnosis not present

## 2022-10-20 DIAGNOSIS — N186 End stage renal disease: Secondary | ICD-10-CM | POA: Diagnosis not present

## 2022-10-22 ENCOUNTER — Other Ambulatory Visit (HOSPITAL_COMMUNITY): Payer: Self-pay | Admitting: Nephrology

## 2022-10-22 DIAGNOSIS — N186 End stage renal disease: Secondary | ICD-10-CM

## 2022-10-22 DIAGNOSIS — Z992 Dependence on renal dialysis: Secondary | ICD-10-CM

## 2022-10-25 ENCOUNTER — Ambulatory Visit (HOSPITAL_COMMUNITY)
Admission: RE | Admit: 2022-10-25 | Discharge: 2022-10-25 | Disposition: A | Discharge status: Home / Self Care | Payer: Medicare Other | Source: Ambulatory Visit | Attending: Nephrology | Admitting: Nephrology

## 2022-10-25 ENCOUNTER — Other Ambulatory Visit (HOSPITAL_COMMUNITY): Payer: Self-pay | Admitting: Nephrology

## 2022-10-25 ENCOUNTER — Other Ambulatory Visit: Payer: Self-pay

## 2022-10-25 ENCOUNTER — Other Ambulatory Visit: Payer: Self-pay | Admitting: Radiology

## 2022-10-25 DIAGNOSIS — N186 End stage renal disease: Secondary | ICD-10-CM

## 2022-10-25 DIAGNOSIS — T82868A Thrombosis of vascular prosthetic devices, implants and grafts, initial encounter: Secondary | ICD-10-CM | POA: Diagnosis not present

## 2022-10-25 DIAGNOSIS — Z8249 Family history of ischemic heart disease and other diseases of the circulatory system: Secondary | ICD-10-CM | POA: Insufficient documentation

## 2022-10-25 DIAGNOSIS — I742 Embolism and thrombosis of arteries of the upper extremities: Secondary | ICD-10-CM | POA: Insufficient documentation

## 2022-10-25 DIAGNOSIS — I120 Hypertensive chronic kidney disease with stage 5 chronic kidney disease or end stage renal disease: Secondary | ICD-10-CM | POA: Diagnosis not present

## 2022-10-25 DIAGNOSIS — Y832 Surgical operation with anastomosis, bypass or graft as the cause of abnormal reaction of the patient, or of later complication, without mention of misadventure at the time of the procedure: Secondary | ICD-10-CM | POA: Diagnosis not present

## 2022-10-25 DIAGNOSIS — Z992 Dependence on renal dialysis: Secondary | ICD-10-CM | POA: Diagnosis not present

## 2022-10-25 DIAGNOSIS — E1122 Type 2 diabetes mellitus with diabetic chronic kidney disease: Secondary | ICD-10-CM | POA: Diagnosis not present

## 2022-10-25 DIAGNOSIS — Z87891 Personal history of nicotine dependence: Secondary | ICD-10-CM | POA: Insufficient documentation

## 2022-10-25 DIAGNOSIS — Z841 Family history of disorders of kidney and ureter: Secondary | ICD-10-CM | POA: Insufficient documentation

## 2022-10-25 DIAGNOSIS — T82590A Other mechanical complication of surgically created arteriovenous fistula, initial encounter: Secondary | ICD-10-CM | POA: Diagnosis present

## 2022-10-25 HISTORY — PX: IR US GUIDE VASC ACCESS LEFT: IMG2389

## 2022-10-25 HISTORY — PX: IR ANGIOGRAM EXTREMITY LEFT: IMG651

## 2022-10-25 HISTORY — PX: IR THROMBECTOMY AV FISTULA W/THROMBOLYSIS/PTA INC/SHUNT/IMG LEFT: IMG6106

## 2022-10-25 MED ORDER — MIDAZOLAM HCL 2 MG/2ML IJ SOLN
INTRAMUSCULAR | Status: AC
  Filled 2022-10-25: qty 2

## 2022-10-25 MED ORDER — FENTANYL CITRATE (PF) 100 MCG/2ML IJ SOLN
INTRAMUSCULAR | Status: AC | PRN
  Administered 2022-10-25: 25 ug via INTRAVENOUS
  Administered 2022-10-25: 50 ug via INTRAVENOUS
  Administered 2022-10-25 (×2): 25 ug via INTRAVENOUS

## 2022-10-25 MED ORDER — LIDOCAINE HCL 1 % IJ SOLN
INTRAMUSCULAR | Status: AC
  Filled 2022-10-25: qty 20

## 2022-10-25 MED ORDER — ALTEPLASE 2 MG IJ SOLR
INTRAMUSCULAR | Status: AC
  Filled 2022-10-25: qty 4

## 2022-10-25 MED ORDER — FENTANYL CITRATE (PF) 100 MCG/2ML IJ SOLN
INTRAMUSCULAR | Status: AC
  Filled 2022-10-25: qty 2

## 2022-10-25 MED ORDER — HEPARIN SODIUM (PORCINE) 1000 UNIT/ML IJ SOLN
INTRAMUSCULAR | Status: AC | PRN
  Administered 2022-10-25: 5000 [IU] via INTRAVENOUS
  Administered 2022-10-25: 3000 [IU] via INTRAVENOUS

## 2022-10-25 MED ORDER — HEPARIN SODIUM (PORCINE) 1000 UNIT/ML IJ SOLN
INTRAMUSCULAR | Status: AC
  Filled 2022-10-25: qty 10

## 2022-10-25 MED ORDER — MIDAZOLAM HCL 2 MG/2ML IJ SOLN
INTRAMUSCULAR | Status: AC | PRN
  Administered 2022-10-25: 1 mg via INTRAVENOUS
  Administered 2022-10-25 (×2): .5 mg via INTRAVENOUS

## 2022-10-25 MED ORDER — IOHEXOL 300 MG/ML  SOLN
100.0000 mL | Freq: Once | INTRAMUSCULAR | Status: AC | PRN
  Administered 2022-10-25: 85 mL via INTRAVENOUS

## 2022-10-25 NOTE — Sedation Documentation (Signed)
Pt w/o complaints. Discharge instructions reviewed with verbal understanding. Dressing CDI. Pt escorted out in [facility] ([place] with wife and daughter to home in personal vehicle.

## 2022-10-25 NOTE — Sedation Documentation (Signed)
TPA administered by MD intraprocedure

## 2022-10-25 NOTE — Sedation Documentation (Signed)
Short stay unnable to accept report, states will call back

## 2022-10-25 NOTE — H&P (Signed)
Chief Complaint: Patient was seen in consultation today for clotted HD AVG   Referring Physician(s): Dixon S  Supervising Physician: Michaelle Birks  Patient Status: [facility] - Out-pt  History of Present Illness: Todd Robertson is a 76 y.o. male with hx of ESRD. He is on M-W-F HD via (L)UE AVG. Unfortunately his graft was found to be clotted last week. He is scheduled today for declot procedure. PMHx, meds, labs, imaging, allergies reviewed. Feels well, no recent fevers, chills, illness. Has been NPO today as directed.    Past Medical History:  Diagnosis Date   Arthritis    Cancer ([place])    prostate   Cardiomyopathy    secondary   CKD (chronic kidney disease)    DM2 (diabetes mellitus, type 2) (HCC)    Gout    HTN (hypertension)    unspec   Hypercholesterolemia    Nonischemic cardiomyopathy (HCC)    a. EF 40-50% by most recent 2-D echo b. EF 25% by cardiac cath in 2004   Overweight(278.02)     Past Surgical History:  Procedure Laterality Date   AV FISTULA PLACEMENT Left 06/06/2020   Procedure: LEFT ARM ARTERIOVENOUS (AV) GRAFT USING 45CM GORTEX;  Surgeon: [name], MD;  Location: [facility] OR;  Service: Vascular;  Laterality: Left;   AV FISTULA PLACEMENT Left 06/08/2022   Procedure: INSERTION OF ARTERIOVENOUS GORE-TEX GRAFT UPPER ARM;  Surgeon: [name], MD;  Location: [facility] ORS;  Service: Vascular;  Laterality: Left;   [place] REMOVAL Left 07/27/2022   Procedure: EXCISION OF INFECTED PORTION OF LEFT FOREARM ARTERIOVENOUS GORETEX GRAFT ([place]);  Surgeon: [name], MD;  Location: [facility] ORS;  Service: Vascular;  Laterality: Left;   COLONOSCOPY     HERNIA REPAIR     INSERTION OF DIALYSIS CATHETER Right 06/06/2020   Procedure: INSERTION OF DIALYSIS CATHETER USING 23CM DOUBLE LUMEN CATHETER;  Surgeon: [name], MD;  Location: [place];  Service: Vascular;  Laterality: Right;   IR FLUORO GUIDE CV LINE RIGHT  05/20/2020   IR US GUIDE VASC ACCESS RIGHT  05/20/2020    KNEE ARTHROPLASTY Left 08/08/2017   Procedure: LEFT TOTAL KNEE ARTHROPLASTY WITH COMPUTER NAVIGATION;  Surgeon: Rod Can, MD;  Location: [place];  Service: Orthopedics;  Laterality: Left;  Needs RNFA   KNEE ARTHROPLASTY Right 11/17/2017   Procedure: RIGHT TOTAL KNEE ARTHROPLASTY WITH COMPUTER NAVIGATION;  Surgeon: Rod Can, MD;  Location: [facility] OR[facility];  Service: Orthopedics;  Laterality: Right;  NEEDS RNFA   REMOVAL OF A DIALYSIS CATHETER Right 09/07/2022   Procedure: MINOR REMOVAL OF A TUNNELED DIALYSIS CATHETER;  Surgeon: [name], MD;  Location: [facility];  Service: Vascular;  Laterality: Right;   ROBOT ASSISTED LAPAROSCOPIC NEPHRECTOMY Right 05/16/2020   Procedure: XI ROBOTIC ASSISTED LAPAROSCOPIC NEPHRECTOMY;  Surgeon: Cleon Gustin, MD;  Location: [facility] ORS;  Service: Urology;  Laterality: Right;  2.5 hrs    Allergies: Patient has no known allergies.  Medications: Prior to Admission medications   Medication Sig Start Date End Date Taking? Authorizing Provider  acetaminophen (TYLENOL) 500 MG tablet Take 500-1,000 mg by mouth every 6 (six) hours as needed for moderate pain.   Yes [provider]  albuterol (VENTOLIN HFA) 108 (90 Base) MCG/ACT inhaler Inhale 1-2 puffs into the lungs every 6 (six) hours as needed for wheezing or shortness of breath. Patient taking differently: Inhale 1-2 puffs into the lungs 2 (two) times daily. 06/24/20  Yes Angiulli, Lavon Paganini, PA-C  allopurinol (ZYLOPRIM)  300 MG tablet Take 300 mg by mouth daily. 10/21/20  Yes [provider]  atorvastatin (LIPITOR) 40 MG tablet Take 1 tablet (40 mg total) by mouth daily. Patient taking differently: Take 40 mg by mouth at bedtime. 06/24/20  Yes Angiulli, Lavon Paganini, PA-C  BREO ELLIPTA 100-25 MCG/ACT AEPB Inhale 1 puff into the lungs daily. 05/18/22  Yes [provider]  calcium acetate (PHOSLO) 667 MG capsule Take 3 capsules (2,001 mg total) by mouth 3 (three) times daily with meals. 06/24/20   Yes Angiulli, Lavon Paganini, PA-C  cetirizine (ZYRTEC) 10 MG tablet Take 10 mg by mouth daily.   Yes [provider]  Cholecalciferol (VITAMIN D) 50 MCG (2000 UT) tablet Take 1 tablet (2,000 Units total) by mouth daily. 06/24/20  Yes Angiulli, Lavon Paganini, PA-C  methocarbamol (ROBAXIN) 500 MG tablet Take 500 mg by mouth 3 (three) times a week. Take Sun, Tue, and Thurs (day before dialysis) 05/11/22  Yes [provider]  metoprolol tartrate (LOPRESSOR) 25 MG tablet Take 0.5 tablets (12.5 mg total) by mouth 2 (two) times daily. Patient taking differently: Take 12.5 mg by mouth 2 (two) times daily. after dialysis 07/16/20  Yes Verta Ellen., NP  midodrine (PROAMATINE) 10 MG tablet TAKE 1 TABLET BY MOUTH THREE TIMES DAILY WITH MEALS 07/21/22  Yes Branch, Alphonse Guild, MD  Multiple Vitamins-Minerals (CENTRUM SILVER 50+MEN) TABS Take 1 tablet by mouth daily.   Yes [provider]  Naphazoline HCl (CLEAR EYES OP) Place 1 drop into both eyes daily.   Yes [provider]  Polyethylene Glycol 3350 (MIRALAX PO) Take 17 g by mouth daily as needed (constipation).   Yes [provider]  senna (SENOKOT) 8.6 MG tablet Take 1-2 tablets by mouth daily as needed for constipation.   Yes [provider]  tamsulosin (FLOMAX) 0.4 MG CAPS capsule Take 1 capsule (0.4 mg total) by mouth daily. Patient taking differently: Take 0.4 mg by mouth daily after supper. 12/17/21  Yes Irine Seal, MD  oxyCODONE-acetaminophen (PERCOCET) 5-325 MG tablet Take 1 tablet by mouth every 6 (six) hours as needed for severe pain. 07/27/22   [name], MD  trolamine salicylate (ASPERCREME) 10 % cream Apply 1 Application topically as needed for muscle pain.    [provider]     Family History  Problem Relation Age of Onset   Hypertension Mother    Kidney disease Mother    Heart disease Father    Kidney disease Other     Social History   Socioeconomic History   Marital status:  Married    Spouse name: Not on file   Number of children: 2   Years of education: Not on file   Highest education level: Not on file  Occupational History   Occupation: retired  Tobacco Use   Smoking status: Former    Packs/day: 0.50    Years: 20.00    Total pack years: 10.00    Types: Cigarettes    Start date: 02/12/1966    Quit date: 11/08/1984    Years since quitting: 37.9   Smokeless tobacco: Never  Vaping Use   Vaping Use: Never used  Substance and Sexual Activity   Alcohol use: No    Alcohol/week: 0.0 standard drinks of alcohol   Drug use: No   Sexual activity: Yes    Birth control/protection: None  Other Topics Concern   Not on file  Social History Narrative   Full time.  Social Determinants of Health   Financial Resource Strain: Not on file  Food Insecurity: Not on file  Transportation Needs: Not on file  Physical Activity: Not on file  Stress: Not on file  Social Connections: Not on file    Review of Systems: A 12 point ROS discussed and pertinent positives are indicated in the HPI above.  All other systems are negative.  Review of Systems  Vital Signs: BP 129/69   Pulse 71   Temp (!) 97.3 F (36.3 C) (Temporal)   Resp 18   Ht '6\' 2"'$  (1.88 m)   Wt 220 lb (99.8 kg)   SpO2 97%   BMI 28.25 kg/m   Physical Exam Constitutional:      Appearance: Normal appearance. He is not ill-appearing.  HENT:     Mouth/Throat:     Mouth: Mucous membranes are moist.     Pharynx: Oropharynx is clear.  Cardiovascular:     Rate and Rhythm: Normal rate and regular rhythm.     Heart sounds: Normal heart sounds.  Pulmonary:     Effort: Pulmonary effort is normal. No respiratory distress.     Breath sounds: Normal breath sounds.  Musculoskeletal:     Comments: (L)UE AVG noted. No palpable pulse/thrill Hand warm  Neurological:     Mental Status: He is alert.  Psychiatric:        Mood and Affect: Mood normal.        Thought Content: Thought content normal.         Judgment: Judgment normal.     Imaging: No results found.  Labs:  CBC: Recent Labs    06/08/22 0812 07/27/22 0812  HGB 11.2* 11.6*  HCT 33.0* 34.0*    COAGS: No results for input(s): "INR", "APTT" in the last 8760 hours.  BMP: Recent Labs    06/08/22 0812 07/27/22 0812  NA 136 139  K 4.3 5.0  CL 99 101  GLUCOSE 86 94  BUN 51* 47*  CREATININE 9.30* 10.90*    Assessment and Plan: Thrombosed (L)UE AVG Risks and benefits discussed with the patient including, but not limited to bleeding, infection, vascular injury, pulmonary embolism, need for tunneled HD catheter placement or even death.  All of the patient's questions were answered, patient is agreeable to proceed. Consent signed and in chart.    Electronically Signed: Ascencion Dike, PA-C 10/25/2022, 12:56 PM   I spent a total of 20 in face to face in clinical consultation, greater than 50% of which was counseling/coordinating care for (L)UE AVG declot

## 2022-10-25 NOTE — Procedures (Signed)
Vascular and Interventional Radiology Procedure Note  Patient: Todd Robertson DOB: [birth year] Medical Record Number: [id number] Note Date/Time: 10/25/22 4:22 PM   Performing Physician: Michaelle Birks, MD Assistant(s): Darcel Bayley, IR RT  Diagnosis: ESRD on HD. Malfunctioning LUE AVG  Procedure:  LEFT UPPER EXTREMITY DIALYSIS CIRCUIT STUDY PHARMACO-MECHANICAL THROMBECTOMY of CLOTTED AVG BALLOON ANGIOPLASTY of VENOUS ANASTOMOSIS STENOSIS LEFT UPPER EXTREMITY ARTERIOGRAPHY  Anesthesia: Conscious Sedation Complications: None Estimated Blood Loss: Minimal Specimens: None  Findings:  - thrombosed AVG without palpable thrill or flow on POCUS - anterograde and retrograde at the LUE AVG - 8K units of Heparin IV total and '4mg'$  tPA administered - rotational thrombectomy with a Argon Cleaner system - 37m and 150mPTA of VA stenosis - LUE arteriogram with patent AA - widely patent LUE AVG at the end of the case  Plan: OK for HD tomorrow Remove purse string sutures at HD session  Final report to follow once all images are reviewed and compared with previous studies.  See detailed dictation with images in PACS. The patient tolerated the procedure well without incident or complication and was returned to Recovery in stable condition.    JoMichaelle BirksMD [facility]   Pager. [phone]

## 2022-10-26 DIAGNOSIS — Z992 Dependence on renal dialysis: Secondary | ICD-10-CM | POA: Diagnosis not present

## 2022-10-26 DIAGNOSIS — N186 End stage renal disease: Secondary | ICD-10-CM | POA: Diagnosis not present

## 2022-10-26 DIAGNOSIS — N2581 Secondary hyperparathyroidism of renal origin: Secondary | ICD-10-CM | POA: Diagnosis not present

## 2022-10-26 DIAGNOSIS — D509 Iron deficiency anemia, unspecified: Secondary | ICD-10-CM | POA: Diagnosis not present

## 2022-10-26 DIAGNOSIS — D631 Anemia in chronic kidney disease: Secondary | ICD-10-CM | POA: Diagnosis not present

## 2022-10-27 DIAGNOSIS — N186 End stage renal disease: Secondary | ICD-10-CM | POA: Diagnosis not present

## 2022-10-27 DIAGNOSIS — D509 Iron deficiency anemia, unspecified: Secondary | ICD-10-CM | POA: Diagnosis not present

## 2022-10-27 DIAGNOSIS — N2581 Secondary hyperparathyroidism of renal origin: Secondary | ICD-10-CM | POA: Diagnosis not present

## 2022-10-27 DIAGNOSIS — D631 Anemia in chronic kidney disease: Secondary | ICD-10-CM | POA: Diagnosis not present

## 2022-10-27 DIAGNOSIS — Z992 Dependence on renal dialysis: Secondary | ICD-10-CM | POA: Diagnosis not present

## 2022-10-29 DIAGNOSIS — E7800 Pure hypercholesterolemia, unspecified: Secondary | ICD-10-CM | POA: Diagnosis not present

## 2022-10-29 DIAGNOSIS — E1165 Type 2 diabetes mellitus with hyperglycemia: Secondary | ICD-10-CM | POA: Diagnosis not present

## 2022-10-29 DIAGNOSIS — Z79899 Other long term (current) drug therapy: Secondary | ICD-10-CM | POA: Diagnosis not present

## 2022-10-29 DIAGNOSIS — Z6828 Body mass index (BMI) 28.0-28.9, adult: Secondary | ICD-10-CM | POA: Diagnosis not present

## 2022-10-29 DIAGNOSIS — Z7189 Other specified counseling: Secondary | ICD-10-CM | POA: Diagnosis not present

## 2022-10-29 DIAGNOSIS — Z1331 Encounter for screening for depression: Secondary | ICD-10-CM | POA: Diagnosis not present

## 2022-10-29 DIAGNOSIS — Z125 Encounter for screening for malignant neoplasm of prostate: Secondary | ICD-10-CM | POA: Diagnosis not present

## 2022-10-29 DIAGNOSIS — Z299 Encounter for prophylactic measures, unspecified: Secondary | ICD-10-CM | POA: Diagnosis not present

## 2022-10-29 DIAGNOSIS — Z1339 Encounter for screening examination for other mental health and behavioral disorders: Secondary | ICD-10-CM | POA: Diagnosis not present

## 2022-10-29 DIAGNOSIS — R5383 Other fatigue: Secondary | ICD-10-CM | POA: Diagnosis not present

## 2022-10-29 DIAGNOSIS — Z0000 Encounter for general adult medical examination without abnormal findings: Secondary | ICD-10-CM | POA: Diagnosis not present

## 2022-10-30 DIAGNOSIS — N2581 Secondary hyperparathyroidism of renal origin: Secondary | ICD-10-CM | POA: Diagnosis not present

## 2022-10-30 DIAGNOSIS — Z992 Dependence on renal dialysis: Secondary | ICD-10-CM | POA: Diagnosis not present

## 2022-10-30 DIAGNOSIS — D631 Anemia in chronic kidney disease: Secondary | ICD-10-CM | POA: Diagnosis not present

## 2022-10-30 DIAGNOSIS — D509 Iron deficiency anemia, unspecified: Secondary | ICD-10-CM | POA: Diagnosis not present

## 2022-10-30 DIAGNOSIS — N186 End stage renal disease: Secondary | ICD-10-CM | POA: Diagnosis not present

## 2022-11-02 DIAGNOSIS — Z992 Dependence on renal dialysis: Secondary | ICD-10-CM | POA: Diagnosis not present

## 2022-11-02 DIAGNOSIS — T82868A Thrombosis of vascular prosthetic devices, implants and grafts, initial encounter: Secondary | ICD-10-CM | POA: Diagnosis not present

## 2022-11-02 DIAGNOSIS — T82858A Stenosis of vascular prosthetic devices, implants and grafts, initial encounter: Secondary | ICD-10-CM | POA: Diagnosis not present

## 2022-11-02 DIAGNOSIS — N186 End stage renal disease: Secondary | ICD-10-CM | POA: Diagnosis not present

## 2022-11-03 DIAGNOSIS — D509 Iron deficiency anemia, unspecified: Secondary | ICD-10-CM | POA: Diagnosis not present

## 2022-11-03 DIAGNOSIS — Z992 Dependence on renal dialysis: Secondary | ICD-10-CM | POA: Diagnosis not present

## 2022-11-03 DIAGNOSIS — N2581 Secondary hyperparathyroidism of renal origin: Secondary | ICD-10-CM | POA: Diagnosis not present

## 2022-11-03 DIAGNOSIS — N186 End stage renal disease: Secondary | ICD-10-CM | POA: Diagnosis not present

## 2022-11-03 DIAGNOSIS — D631 Anemia in chronic kidney disease: Secondary | ICD-10-CM | POA: Diagnosis not present

## 2022-11-05 DIAGNOSIS — Z992 Dependence on renal dialysis: Secondary | ICD-10-CM | POA: Diagnosis not present

## 2022-11-05 DIAGNOSIS — N2581 Secondary hyperparathyroidism of renal origin: Secondary | ICD-10-CM | POA: Diagnosis not present

## 2022-11-05 DIAGNOSIS — D509 Iron deficiency anemia, unspecified: Secondary | ICD-10-CM | POA: Diagnosis not present

## 2022-11-05 DIAGNOSIS — N186 End stage renal disease: Secondary | ICD-10-CM | POA: Diagnosis not present

## 2022-11-05 DIAGNOSIS — D631 Anemia in chronic kidney disease: Secondary | ICD-10-CM | POA: Diagnosis not present

## 2022-11-07 DIAGNOSIS — Z992 Dependence on renal dialysis: Secondary | ICD-10-CM | POA: Diagnosis not present

## 2022-11-07 DIAGNOSIS — N186 End stage renal disease: Secondary | ICD-10-CM | POA: Diagnosis not present

## 2022-11-08 DIAGNOSIS — Z992 Dependence on renal dialysis: Secondary | ICD-10-CM | POA: Diagnosis not present

## 2022-11-08 DIAGNOSIS — N2581 Secondary hyperparathyroidism of renal origin: Secondary | ICD-10-CM | POA: Diagnosis not present

## 2022-11-08 DIAGNOSIS — N250 Renal osteodystrophy: Secondary | ICD-10-CM | POA: Diagnosis not present

## 2022-11-08 DIAGNOSIS — D631 Anemia in chronic kidney disease: Secondary | ICD-10-CM | POA: Diagnosis not present

## 2022-11-08 DIAGNOSIS — D509 Iron deficiency anemia, unspecified: Secondary | ICD-10-CM | POA: Diagnosis not present

## 2022-11-08 DIAGNOSIS — N186 End stage renal disease: Secondary | ICD-10-CM | POA: Diagnosis not present

## 2022-11-10 DIAGNOSIS — N186 End stage renal disease: Secondary | ICD-10-CM | POA: Diagnosis not present

## 2022-11-10 DIAGNOSIS — N250 Renal osteodystrophy: Secondary | ICD-10-CM | POA: Diagnosis not present

## 2022-11-10 DIAGNOSIS — D631 Anemia in chronic kidney disease: Secondary | ICD-10-CM | POA: Diagnosis not present

## 2022-11-10 DIAGNOSIS — D509 Iron deficiency anemia, unspecified: Secondary | ICD-10-CM | POA: Diagnosis not present

## 2022-11-10 DIAGNOSIS — Z992 Dependence on renal dialysis: Secondary | ICD-10-CM | POA: Diagnosis not present

## 2022-11-10 DIAGNOSIS — N2581 Secondary hyperparathyroidism of renal origin: Secondary | ICD-10-CM | POA: Diagnosis not present

## 2022-11-12 DIAGNOSIS — N2581 Secondary hyperparathyroidism of renal origin: Secondary | ICD-10-CM | POA: Diagnosis not present

## 2022-11-12 DIAGNOSIS — D631 Anemia in chronic kidney disease: Secondary | ICD-10-CM | POA: Diagnosis not present

## 2022-11-12 DIAGNOSIS — D509 Iron deficiency anemia, unspecified: Secondary | ICD-10-CM | POA: Diagnosis not present

## 2022-11-12 DIAGNOSIS — Z992 Dependence on renal dialysis: Secondary | ICD-10-CM | POA: Diagnosis not present

## 2022-11-12 DIAGNOSIS — N186 End stage renal disease: Secondary | ICD-10-CM | POA: Diagnosis not present

## 2022-11-12 DIAGNOSIS — N250 Renal osteodystrophy: Secondary | ICD-10-CM | POA: Diagnosis not present

## 2022-11-15 DIAGNOSIS — D631 Anemia in chronic kidney disease: Secondary | ICD-10-CM | POA: Diagnosis not present

## 2022-11-15 DIAGNOSIS — D509 Iron deficiency anemia, unspecified: Secondary | ICD-10-CM | POA: Diagnosis not present

## 2022-11-15 DIAGNOSIS — N2581 Secondary hyperparathyroidism of renal origin: Secondary | ICD-10-CM | POA: Diagnosis not present

## 2022-11-15 DIAGNOSIS — N186 End stage renal disease: Secondary | ICD-10-CM | POA: Diagnosis not present

## 2022-11-15 DIAGNOSIS — Z992 Dependence on renal dialysis: Secondary | ICD-10-CM | POA: Diagnosis not present

## 2022-11-15 DIAGNOSIS — N250 Renal osteodystrophy: Secondary | ICD-10-CM | POA: Diagnosis not present

## 2022-11-17 DIAGNOSIS — N250 Renal osteodystrophy: Secondary | ICD-10-CM | POA: Diagnosis not present

## 2022-11-17 DIAGNOSIS — D509 Iron deficiency anemia, unspecified: Secondary | ICD-10-CM | POA: Diagnosis not present

## 2022-11-17 DIAGNOSIS — N186 End stage renal disease: Secondary | ICD-10-CM | POA: Diagnosis not present

## 2022-11-17 DIAGNOSIS — Z992 Dependence on renal dialysis: Secondary | ICD-10-CM | POA: Diagnosis not present

## 2022-11-17 DIAGNOSIS — D631 Anemia in chronic kidney disease: Secondary | ICD-10-CM | POA: Diagnosis not present

## 2022-11-17 DIAGNOSIS — N2581 Secondary hyperparathyroidism of renal origin: Secondary | ICD-10-CM | POA: Diagnosis not present

## 2022-11-19 DIAGNOSIS — D631 Anemia in chronic kidney disease: Secondary | ICD-10-CM | POA: Diagnosis not present

## 2022-11-19 DIAGNOSIS — N2581 Secondary hyperparathyroidism of renal origin: Secondary | ICD-10-CM | POA: Diagnosis not present

## 2022-11-19 DIAGNOSIS — D509 Iron deficiency anemia, unspecified: Secondary | ICD-10-CM | POA: Diagnosis not present

## 2022-11-19 DIAGNOSIS — Z992 Dependence on renal dialysis: Secondary | ICD-10-CM | POA: Diagnosis not present

## 2022-11-19 DIAGNOSIS — N250 Renal osteodystrophy: Secondary | ICD-10-CM | POA: Diagnosis not present

## 2022-11-19 DIAGNOSIS — N186 End stage renal disease: Secondary | ICD-10-CM | POA: Diagnosis not present

## 2022-11-22 DIAGNOSIS — Z992 Dependence on renal dialysis: Secondary | ICD-10-CM | POA: Diagnosis not present

## 2022-11-22 DIAGNOSIS — N2581 Secondary hyperparathyroidism of renal origin: Secondary | ICD-10-CM | POA: Diagnosis not present

## 2022-11-22 DIAGNOSIS — N186 End stage renal disease: Secondary | ICD-10-CM | POA: Diagnosis not present

## 2022-11-22 DIAGNOSIS — N250 Renal osteodystrophy: Secondary | ICD-10-CM | POA: Diagnosis not present

## 2022-11-22 DIAGNOSIS — D509 Iron deficiency anemia, unspecified: Secondary | ICD-10-CM | POA: Diagnosis not present

## 2022-11-22 DIAGNOSIS — D631 Anemia in chronic kidney disease: Secondary | ICD-10-CM | POA: Diagnosis not present

## 2022-11-24 DIAGNOSIS — D509 Iron deficiency anemia, unspecified: Secondary | ICD-10-CM | POA: Diagnosis not present

## 2022-11-24 DIAGNOSIS — N250 Renal osteodystrophy: Secondary | ICD-10-CM | POA: Diagnosis not present

## 2022-11-24 DIAGNOSIS — N186 End stage renal disease: Secondary | ICD-10-CM | POA: Diagnosis not present

## 2022-11-24 DIAGNOSIS — N2581 Secondary hyperparathyroidism of renal origin: Secondary | ICD-10-CM | POA: Diagnosis not present

## 2022-11-24 DIAGNOSIS — Z992 Dependence on renal dialysis: Secondary | ICD-10-CM | POA: Diagnosis not present

## 2022-11-24 DIAGNOSIS — D631 Anemia in chronic kidney disease: Secondary | ICD-10-CM | POA: Diagnosis not present

## 2022-11-25 DIAGNOSIS — L110 Acquired keratosis follicularis: Secondary | ICD-10-CM | POA: Diagnosis not present

## 2022-11-25 DIAGNOSIS — E1151 Type 2 diabetes mellitus with diabetic peripheral angiopathy without gangrene: Secondary | ICD-10-CM | POA: Diagnosis not present

## 2022-11-25 DIAGNOSIS — E1140 Type 2 diabetes mellitus with diabetic neuropathy, unspecified: Secondary | ICD-10-CM | POA: Diagnosis not present

## 2022-11-25 DIAGNOSIS — B351 Tinea unguium: Secondary | ICD-10-CM | POA: Diagnosis not present

## 2022-11-26 DIAGNOSIS — N250 Renal osteodystrophy: Secondary | ICD-10-CM | POA: Diagnosis not present

## 2022-11-26 DIAGNOSIS — N186 End stage renal disease: Secondary | ICD-10-CM | POA: Diagnosis not present

## 2022-11-26 DIAGNOSIS — Z992 Dependence on renal dialysis: Secondary | ICD-10-CM | POA: Diagnosis not present

## 2022-11-26 DIAGNOSIS — D631 Anemia in chronic kidney disease: Secondary | ICD-10-CM | POA: Diagnosis not present

## 2022-11-26 DIAGNOSIS — D509 Iron deficiency anemia, unspecified: Secondary | ICD-10-CM | POA: Diagnosis not present

## 2022-11-26 DIAGNOSIS — N2581 Secondary hyperparathyroidism of renal origin: Secondary | ICD-10-CM | POA: Diagnosis not present

## 2022-11-29 DIAGNOSIS — Z992 Dependence on renal dialysis: Secondary | ICD-10-CM | POA: Diagnosis not present

## 2022-11-29 DIAGNOSIS — D509 Iron deficiency anemia, unspecified: Secondary | ICD-10-CM | POA: Diagnosis not present

## 2022-11-29 DIAGNOSIS — D631 Anemia in chronic kidney disease: Secondary | ICD-10-CM | POA: Diagnosis not present

## 2022-11-29 DIAGNOSIS — N250 Renal osteodystrophy: Secondary | ICD-10-CM | POA: Diagnosis not present

## 2022-11-29 DIAGNOSIS — N186 End stage renal disease: Secondary | ICD-10-CM | POA: Diagnosis not present

## 2022-11-29 DIAGNOSIS — N2581 Secondary hyperparathyroidism of renal origin: Secondary | ICD-10-CM | POA: Diagnosis not present

## 2022-11-30 DIAGNOSIS — R059 Cough, unspecified: Secondary | ICD-10-CM | POA: Diagnosis not present

## 2022-11-30 DIAGNOSIS — E1122 Type 2 diabetes mellitus with diabetic chronic kidney disease: Secondary | ICD-10-CM | POA: Diagnosis not present

## 2022-11-30 DIAGNOSIS — I10 Essential (primary) hypertension: Secondary | ICD-10-CM | POA: Diagnosis not present

## 2022-11-30 DIAGNOSIS — Z299 Encounter for prophylactic measures, unspecified: Secondary | ICD-10-CM | POA: Diagnosis not present

## 2022-11-30 DIAGNOSIS — E1165 Type 2 diabetes mellitus with hyperglycemia: Secondary | ICD-10-CM | POA: Diagnosis not present

## 2022-12-01 DIAGNOSIS — D509 Iron deficiency anemia, unspecified: Secondary | ICD-10-CM | POA: Diagnosis not present

## 2022-12-01 DIAGNOSIS — N250 Renal osteodystrophy: Secondary | ICD-10-CM | POA: Diagnosis not present

## 2022-12-01 DIAGNOSIS — D631 Anemia in chronic kidney disease: Secondary | ICD-10-CM | POA: Diagnosis not present

## 2022-12-01 DIAGNOSIS — N186 End stage renal disease: Secondary | ICD-10-CM | POA: Diagnosis not present

## 2022-12-01 DIAGNOSIS — Z992 Dependence on renal dialysis: Secondary | ICD-10-CM | POA: Diagnosis not present

## 2022-12-01 DIAGNOSIS — N2581 Secondary hyperparathyroidism of renal origin: Secondary | ICD-10-CM | POA: Diagnosis not present

## 2022-12-03 DIAGNOSIS — Z992 Dependence on renal dialysis: Secondary | ICD-10-CM | POA: Diagnosis not present

## 2022-12-03 DIAGNOSIS — D509 Iron deficiency anemia, unspecified: Secondary | ICD-10-CM | POA: Diagnosis not present

## 2022-12-03 DIAGNOSIS — D631 Anemia in chronic kidney disease: Secondary | ICD-10-CM | POA: Diagnosis not present

## 2022-12-03 DIAGNOSIS — N186 End stage renal disease: Secondary | ICD-10-CM | POA: Diagnosis not present

## 2022-12-03 DIAGNOSIS — N2581 Secondary hyperparathyroidism of renal origin: Secondary | ICD-10-CM | POA: Diagnosis not present

## 2022-12-03 DIAGNOSIS — N250 Renal osteodystrophy: Secondary | ICD-10-CM | POA: Diagnosis not present

## 2022-12-06 DIAGNOSIS — N186 End stage renal disease: Secondary | ICD-10-CM | POA: Diagnosis not present

## 2022-12-06 DIAGNOSIS — N250 Renal osteodystrophy: Secondary | ICD-10-CM | POA: Diagnosis not present

## 2022-12-06 DIAGNOSIS — D509 Iron deficiency anemia, unspecified: Secondary | ICD-10-CM | POA: Diagnosis not present

## 2022-12-06 DIAGNOSIS — D631 Anemia in chronic kidney disease: Secondary | ICD-10-CM | POA: Diagnosis not present

## 2022-12-06 DIAGNOSIS — Z992 Dependence on renal dialysis: Secondary | ICD-10-CM | POA: Diagnosis not present

## 2022-12-06 DIAGNOSIS — N2581 Secondary hyperparathyroidism of renal origin: Secondary | ICD-10-CM | POA: Diagnosis not present

## 2022-12-08 DIAGNOSIS — D509 Iron deficiency anemia, unspecified: Secondary | ICD-10-CM | POA: Diagnosis not present

## 2022-12-08 DIAGNOSIS — N186 End stage renal disease: Secondary | ICD-10-CM | POA: Diagnosis not present

## 2022-12-08 DIAGNOSIS — N2581 Secondary hyperparathyroidism of renal origin: Secondary | ICD-10-CM | POA: Diagnosis not present

## 2022-12-08 DIAGNOSIS — N250 Renal osteodystrophy: Secondary | ICD-10-CM | POA: Diagnosis not present

## 2022-12-08 DIAGNOSIS — Z992 Dependence on renal dialysis: Secondary | ICD-10-CM | POA: Diagnosis not present

## 2022-12-08 DIAGNOSIS — D631 Anemia in chronic kidney disease: Secondary | ICD-10-CM | POA: Diagnosis not present

## 2022-12-10 DIAGNOSIS — D509 Iron deficiency anemia, unspecified: Secondary | ICD-10-CM | POA: Diagnosis not present

## 2022-12-10 DIAGNOSIS — N2581 Secondary hyperparathyroidism of renal origin: Secondary | ICD-10-CM | POA: Diagnosis not present

## 2022-12-10 DIAGNOSIS — N250 Renal osteodystrophy: Secondary | ICD-10-CM | POA: Diagnosis not present

## 2022-12-10 DIAGNOSIS — Z992 Dependence on renal dialysis: Secondary | ICD-10-CM | POA: Diagnosis not present

## 2022-12-10 DIAGNOSIS — N186 End stage renal disease: Secondary | ICD-10-CM | POA: Diagnosis not present

## 2022-12-10 DIAGNOSIS — D631 Anemia in chronic kidney disease: Secondary | ICD-10-CM | POA: Diagnosis not present

## 2022-12-13 DIAGNOSIS — N250 Renal osteodystrophy: Secondary | ICD-10-CM | POA: Diagnosis not present

## 2022-12-13 DIAGNOSIS — Z992 Dependence on renal dialysis: Secondary | ICD-10-CM | POA: Diagnosis not present

## 2022-12-13 DIAGNOSIS — N2581 Secondary hyperparathyroidism of renal origin: Secondary | ICD-10-CM | POA: Diagnosis not present

## 2022-12-13 DIAGNOSIS — D631 Anemia in chronic kidney disease: Secondary | ICD-10-CM | POA: Diagnosis not present

## 2022-12-13 DIAGNOSIS — D509 Iron deficiency anemia, unspecified: Secondary | ICD-10-CM | POA: Diagnosis not present

## 2022-12-13 DIAGNOSIS — N186 End stage renal disease: Secondary | ICD-10-CM | POA: Diagnosis not present

## 2022-12-15 DIAGNOSIS — N186 End stage renal disease: Secondary | ICD-10-CM | POA: Diagnosis not present

## 2022-12-15 DIAGNOSIS — N2581 Secondary hyperparathyroidism of renal origin: Secondary | ICD-10-CM | POA: Diagnosis not present

## 2022-12-15 DIAGNOSIS — D509 Iron deficiency anemia, unspecified: Secondary | ICD-10-CM | POA: Diagnosis not present

## 2022-12-15 DIAGNOSIS — Z992 Dependence on renal dialysis: Secondary | ICD-10-CM | POA: Diagnosis not present

## 2022-12-15 DIAGNOSIS — D631 Anemia in chronic kidney disease: Secondary | ICD-10-CM | POA: Diagnosis not present

## 2022-12-15 DIAGNOSIS — N250 Renal osteodystrophy: Secondary | ICD-10-CM | POA: Diagnosis not present

## 2022-12-17 DIAGNOSIS — D631 Anemia in chronic kidney disease: Secondary | ICD-10-CM | POA: Diagnosis not present

## 2022-12-17 DIAGNOSIS — D509 Iron deficiency anemia, unspecified: Secondary | ICD-10-CM | POA: Diagnosis not present

## 2022-12-17 DIAGNOSIS — N186 End stage renal disease: Secondary | ICD-10-CM | POA: Diagnosis not present

## 2022-12-17 DIAGNOSIS — Z992 Dependence on renal dialysis: Secondary | ICD-10-CM | POA: Diagnosis not present

## 2022-12-17 DIAGNOSIS — N250 Renal osteodystrophy: Secondary | ICD-10-CM | POA: Diagnosis not present

## 2022-12-17 DIAGNOSIS — N2581 Secondary hyperparathyroidism of renal origin: Secondary | ICD-10-CM | POA: Diagnosis not present

## 2022-12-20 DIAGNOSIS — D509 Iron deficiency anemia, unspecified: Secondary | ICD-10-CM | POA: Diagnosis not present

## 2022-12-20 DIAGNOSIS — N2581 Secondary hyperparathyroidism of renal origin: Secondary | ICD-10-CM | POA: Diagnosis not present

## 2022-12-20 DIAGNOSIS — N186 End stage renal disease: Secondary | ICD-10-CM | POA: Diagnosis not present

## 2022-12-20 DIAGNOSIS — D631 Anemia in chronic kidney disease: Secondary | ICD-10-CM | POA: Diagnosis not present

## 2022-12-20 DIAGNOSIS — Z992 Dependence on renal dialysis: Secondary | ICD-10-CM | POA: Diagnosis not present

## 2022-12-20 DIAGNOSIS — N250 Renal osteodystrophy: Secondary | ICD-10-CM | POA: Diagnosis not present

## 2022-12-22 DIAGNOSIS — N250 Renal osteodystrophy: Secondary | ICD-10-CM | POA: Diagnosis not present

## 2022-12-22 DIAGNOSIS — Z992 Dependence on renal dialysis: Secondary | ICD-10-CM | POA: Diagnosis not present

## 2022-12-22 DIAGNOSIS — D509 Iron deficiency anemia, unspecified: Secondary | ICD-10-CM | POA: Diagnosis not present

## 2022-12-22 DIAGNOSIS — N186 End stage renal disease: Secondary | ICD-10-CM | POA: Diagnosis not present

## 2022-12-22 DIAGNOSIS — N2581 Secondary hyperparathyroidism of renal origin: Secondary | ICD-10-CM | POA: Diagnosis not present

## 2022-12-22 DIAGNOSIS — D631 Anemia in chronic kidney disease: Secondary | ICD-10-CM | POA: Diagnosis not present

## 2022-12-23 ENCOUNTER — Ambulatory Visit: Payer: Medicare Other | Admitting: Urology

## 2022-12-24 DIAGNOSIS — N2581 Secondary hyperparathyroidism of renal origin: Secondary | ICD-10-CM | POA: Diagnosis not present

## 2022-12-24 DIAGNOSIS — D509 Iron deficiency anemia, unspecified: Secondary | ICD-10-CM | POA: Diagnosis not present

## 2022-12-24 DIAGNOSIS — D631 Anemia in chronic kidney disease: Secondary | ICD-10-CM | POA: Diagnosis not present

## 2022-12-24 DIAGNOSIS — N250 Renal osteodystrophy: Secondary | ICD-10-CM | POA: Diagnosis not present

## 2022-12-24 DIAGNOSIS — N186 End stage renal disease: Secondary | ICD-10-CM | POA: Diagnosis not present

## 2022-12-24 DIAGNOSIS — Z992 Dependence on renal dialysis: Secondary | ICD-10-CM | POA: Diagnosis not present

## 2022-12-27 ENCOUNTER — Other Ambulatory Visit: Payer: Self-pay | Admitting: Urology

## 2022-12-27 DIAGNOSIS — N186 End stage renal disease: Secondary | ICD-10-CM | POA: Diagnosis not present

## 2022-12-27 DIAGNOSIS — R3912 Poor urinary stream: Secondary | ICD-10-CM

## 2022-12-27 DIAGNOSIS — Z992 Dependence on renal dialysis: Secondary | ICD-10-CM | POA: Diagnosis not present

## 2022-12-27 DIAGNOSIS — N2581 Secondary hyperparathyroidism of renal origin: Secondary | ICD-10-CM | POA: Diagnosis not present

## 2022-12-27 DIAGNOSIS — D631 Anemia in chronic kidney disease: Secondary | ICD-10-CM | POA: Diagnosis not present

## 2022-12-27 DIAGNOSIS — N250 Renal osteodystrophy: Secondary | ICD-10-CM | POA: Diagnosis not present

## 2022-12-27 DIAGNOSIS — D509 Iron deficiency anemia, unspecified: Secondary | ICD-10-CM | POA: Diagnosis not present

## 2022-12-27 DIAGNOSIS — Z85528 Personal history of other malignant neoplasm of kidney: Secondary | ICD-10-CM

## 2022-12-29 DIAGNOSIS — N2581 Secondary hyperparathyroidism of renal origin: Secondary | ICD-10-CM | POA: Diagnosis not present

## 2022-12-29 DIAGNOSIS — Z992 Dependence on renal dialysis: Secondary | ICD-10-CM | POA: Diagnosis not present

## 2022-12-29 DIAGNOSIS — D509 Iron deficiency anemia, unspecified: Secondary | ICD-10-CM | POA: Diagnosis not present

## 2022-12-29 DIAGNOSIS — D631 Anemia in chronic kidney disease: Secondary | ICD-10-CM | POA: Diagnosis not present

## 2022-12-29 DIAGNOSIS — N186 End stage renal disease: Secondary | ICD-10-CM | POA: Diagnosis not present

## 2022-12-29 DIAGNOSIS — N250 Renal osteodystrophy: Secondary | ICD-10-CM | POA: Diagnosis not present

## 2022-12-30 ENCOUNTER — Ambulatory Visit (INDEPENDENT_AMBULATORY_CARE_PROVIDER_SITE_OTHER): Payer: Medicare Other | Admitting: Urology

## 2022-12-30 ENCOUNTER — Encounter: Payer: Self-pay | Admitting: Urology

## 2022-12-30 VITALS — BP 126/64 | HR 94 | Ht 74.0 in | Wt 221.0 lb

## 2022-12-30 DIAGNOSIS — R9721 Rising PSA following treatment for malignant neoplasm of prostate: Secondary | ICD-10-CM | POA: Diagnosis not present

## 2022-12-30 DIAGNOSIS — Z85528 Personal history of other malignant neoplasm of kidney: Secondary | ICD-10-CM

## 2022-12-30 DIAGNOSIS — R3912 Poor urinary stream: Secondary | ICD-10-CM | POA: Diagnosis not present

## 2022-12-30 DIAGNOSIS — Z8546 Personal history of malignant neoplasm of prostate: Secondary | ICD-10-CM | POA: Diagnosis not present

## 2022-12-30 LAB — URINALYSIS, ROUTINE W REFLEX MICROSCOPIC
Bilirubin, UA: NEGATIVE
Glucose, UA: NEGATIVE
Leukocytes,UA: NEGATIVE
Nitrite, UA: NEGATIVE
RBC, UA: NEGATIVE
Specific Gravity, UA: 1.02 (ref 1.005–1.030)
Urobilinogen, Ur: 0.2 mg/dL (ref 0.2–1.0)
pH, UA: 5.5 (ref 5.0–7.5)

## 2022-12-30 LAB — MICROSCOPIC EXAMINATION: Bacteria, UA: NONE SEEN

## 2022-12-30 MED ORDER — TAMSULOSIN HCL 0.4 MG PO CAPS: 0.4000 mg | ORAL_CAPSULE | Freq: Every day | ORAL | 0 refills | Status: DC

## 2022-12-30 MED ORDER — TAMSULOSIN HCL 0.4 MG PO CAPS: 0.4000 mg | ORAL_CAPSULE | Freq: Every day | ORAL | 3 refills | Status: DC

## 2022-12-30 NOTE — Progress Notes (Signed)
Subjective:  1. History of prostate cancer   2. Rising PSA following treatment for malignant neoplasm of prostate   3. History of kidney cancer   4. Weak urinary stream    12/30/22: Todd Robertson returns today in f/u for the history of prostate cancer with prior EXRT.  He didn't get a PSA prior to this visit. The last was up slightly to 0.9.  He remains on dialysis and is still voiding a fair amount on non-diaylsis days.  His IPSS is 14.  He has no nocturia.  He has no bone pain or weight loss.  He has no GI complaints.  A CXR done for f/u of his history of a right RCCa with prior nephrectomy in 8/23 and it showed no mets.   He has had no UTI symptoms.  He remains on tamsulosin for LUTS.  UA has 6-10 WBC but no bacteria.   06/17/22: Todd Robertson returns today in f/u for his history of prostate cancer treated with EXRT 8-9 years ago.  His PSA has been rising but slowly with an increased from 0.8 to 0.9 over the last 6 months. 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  He continues to make moderate urine.  His iPSS is 14 with a reduced stream and urgency.  He remains on tamsulosin. The urine is clear today.  He has no weight loss or bone pain.  He didn't get the CXR that was ordered to be done in April for f/u of the renal cell CA.  UA is unremarkable.   12/17/21: Todd Robertson returns today in f/u.   He last saw Dr. Alyson Ingles on 08/26/21 for f/u of his history of a right nephrectomy for RCCa in 7/21 and imaging was unremarkable.  He is to have a CXR in a couple of months.  He has a history of prostate cancer treated with EXRT and his PSA is 0.8 which is a slight increase from 0.6 in 5/22.  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  He has a history of UTI's.  He last got Cipro in 10/22.  He had cystoscopy on 04/09/21.   HIs UA today is unremarkable.  He has some burning at times.  His IPSS is 17 with frequency and nocturia x 2.    06/18/21:  Todd Robertson returns today in f/u.  He is doing well with an IPSS of 3.  He has had no symptoms of a  recurrent infection.   He didn't get a UA today.   He remains on tamsulosin but is off of of the Levaquin prescribed for post dialysis use on 04/15/21.    He had a right nephrectomy for RCCa in 7/21 and is to have an [place] and CXR next month.   04/09/21: Todd Robertson returns today in f/u for cystoscopy to evaluate his persistent voiding symptoms and cystitis.   His culture on 04/02/21 was negative.   His PSA is 0.6 which is down from the prior levels.  He was given gent '160mg'$  at his last visit and had some transient symptom improvement.     GU Hx: Todd Robertson returns today in f/u for his history of cystitis.  He has dysuria and difficulty voiding.  He has some urgency and frequency.  He has had no hematuria.   He has been treated with bactrim after a round of ceftin.  He has been off of the anitbiotics for about 2 weeks.  He has had no fever.  He had Klebsiella on 03/10/21 and  it was sensitive to the bactrim which he was last given.   He has variable urine output.   He has increased frequency in the afternoon.  He will generally go 4-5x daily.   He has a history of prostate cancer treated with radiation therapy 7-8 years ago.   He had a right radical nephrectomy in 7/21.   The urinary symptoms began after the procedure.   The last PSA's I find were 4.1 in 1/17 and 1.4 in 8/17.     IPSS     Row Name 12/30/22 1100         International Prostate Symptom Score   How often have you had the sensation of not emptying your bladder? Less than 1 in 5     How often have you had to urinate less than every two hours? Almost always     How often have you found you stopped and started again several times when you urinated? About half the time     How often have you found it difficult to postpone urination? Not at All     How often have you had a weak urinary stream? Almost always     How often have you had to strain to start urination? Not at All     How many times did you typically get up at night to urinate? None      Total IPSS Score 14       Quality of Life due to urinary symptoms   If you were to spend the rest of your life with your urinary condition just the way it is now how would you feel about that? Mostly Disatisfied               ROS:  ROS:  A complete review of systems was performed.  All systems are negative except for pertinent findings as noted.   Review of Systems  Constitutional:  Positive for malaise/fatigue.  HENT:  Positive for congestion.   Respiratory:  Positive for cough.   Gastrointestinal:  Positive for constipation.  Musculoskeletal:  Positive for joint pain.  Skin:  Positive for itching.  Neurological:  Positive for weakness and headaches.    No Known Allergies   Outpatient Encounter Medications as of 12/30/2022  Medication Sig   acetaminophen (TYLENOL) 500 MG tablet Take 500-1,000 mg by mouth every 6 (six) hours as needed for moderate pain.   albuterol (VENTOLIN HFA) 108 (90 Base) MCG/ACT inhaler Inhale 1-2 puffs into the lungs every 6 (six) hours as needed for wheezing or shortness of breath. (Patient taking differently: Inhale 1-2 puffs into the lungs 2 (two) times daily.)   allopurinol (ZYLOPRIM) 300 MG tablet Take 300 mg by mouth daily.   atorvastatin (LIPITOR) 40 MG tablet Take 1 tablet (40 mg total) by mouth daily. (Patient taking differently: Take 40 mg by mouth at bedtime.)   BREO ELLIPTA 100-25 MCG/ACT AEPB Inhale 1 puff into the lungs daily.   calcium acetate (PHOSLO) 667 MG capsule Take 3 capsules (2,001 mg total) by mouth 3 (three) times daily with meals.   cetirizine (ZYRTEC) 10 MG tablet Take 10 mg by mouth daily.   Cholecalciferol (VITAMIN D) 50 MCG (2000 UT) tablet Take 1 tablet (2,000 Units total) by mouth daily.   methocarbamol (ROBAXIN) 500 MG tablet Take 500 mg by mouth 3 (three) times a week. Take Sun, Tue, and Thurs (day before dialysis)   metoprolol tartrate (LOPRESSOR) 25 MG tablet Take 0.5 tablets (12.5 mg total)  by mouth 2 (two) times  daily. (Patient taking differently: Take 12.5 mg by mouth 2 (two) times daily. after dialysis)   midodrine (PROAMATINE) 10 MG tablet TAKE 1 TABLET BY MOUTH THREE TIMES DAILY WITH MEALS   Multiple Vitamins-Minerals (CENTRUM SILVER 50+MEN) TABS Take 1 tablet by mouth daily.   Naphazoline HCl (CLEAR EYES OP) Place 1 drop into both eyes daily.   Polyethylene Glycol 3350 (MIRALAX PO) Take 17 g by mouth daily as needed (constipation).   senna (SENOKOT) 8.6 MG tablet Take 1-2 tablets by mouth daily as needed for constipation.   trolamine salicylate (ASPERCREME) 10 % cream Apply 1 Application topically as needed for muscle pain.   [DISCONTINUED] tamsulosin (FLOMAX) 0.4 MG CAPS capsule Take 1 capsule by mouth once daily   tamsulosin (FLOMAX) 0.4 MG CAPS capsule Take 1 capsule (0.4 mg total) by mouth daily.   [DISCONTINUED] oxyCODONE-acetaminophen (PERCOCET) 5-325 MG tablet Take 1 tablet by mouth every 6 (six) hours as needed for severe pain. (Patient not taking: Reported on 12/30/2022)   [DISCONTINUED] tamsulosin (FLOMAX) 0.4 MG CAPS capsule Take 1 capsule (0.4 mg total) by mouth daily.   No facility-administered encounter medications on file as of 12/30/2022.    Past Medical History:  Diagnosis Date   Arthritis    Cancer ([place])    prostate   Cardiomyopathy    secondary   CKD (chronic kidney disease)    DM2 (diabetes mellitus, type 2) (HCC)    Gout    HTN (hypertension)    unspec   Hypercholesterolemia    Nonischemic cardiomyopathy (HCC)    a. EF 40-50% by most recent 2-D echo b. EF 25% by cardiac cath in 2004   Overweight(278.02)     Past Surgical History:  Procedure Laterality Date   AV FISTULA PLACEMENT Left 06/06/2020   Procedure: LEFT ARM ARTERIOVENOUS (AV) GRAFT USING 45CM GORTEX;  Surgeon: [name], MD;  Location: [facility];  Service: Vascular;  Laterality: Left;   AV FISTULA PLACEMENT Left 06/08/2022   Procedure: INSERTION OF ARTERIOVENOUS GORE-TEX GRAFT UPPER ARM;  Surgeon: [name], MD;  Location: [facility];  Service: Vascular;  Laterality: Left;   [place] REMOVAL Left 07/27/2022   Procedure: EXCISION OF INFECTED PORTION OF LEFT FOREARM ARTERIOVENOUS GORETEX GRAFT ([place]);  Surgeon: [name], MD;  Location: [facility];  Service: Vascular;  Laterality: Left;   COLONOSCOPY     HERNIA REPAIR     INSERTION OF DIALYSIS CATHETER Right 06/06/2020   Procedure: INSERTION OF DIALYSIS CATHETER USING 23CM DOUBLE LUMEN CATHETER;  Surgeon: [name], MD;  Location: [facility];  Service: Vascular;  Laterality: Right;   IR ANGIOGRAM EXTREMITY LEFT  10/25/2022   IR FLUORO GUIDE CV LINE RIGHT  05/20/2020   IR THROMBECTOMY AV FISTULA W/THROMBOLYSIS/PTA INC/SHUNT/IMG LEFT Left 10/25/2022   IR US GUIDE VASC ACCESS LEFT  10/25/2022   IR US GUIDE VASC ACCESS RIGHT  05/20/2020   KNEE ARTHROPLASTY Left 08/08/2017   Procedure: LEFT TOTAL KNEE ARTHROPLASTY WITH COMPUTER NAVIGATION;  Surgeon: Rod Can, MD;  Location: [facility];  Service: Orthopedics;  Laterality: Left;  Needs RNFA   KNEE ARTHROPLASTY Right 11/17/2017   Procedure: RIGHT TOTAL KNEE ARTHROPLASTY WITH COMPUTER NAVIGATION;  Surgeon: Rod Can, MD;  Location: [facility];  Service: Orthopedics;  Laterality: Right;  NEEDS RNFA   REMOVAL OF A DIALYSIS CATHETER Right 09/07/2022   Procedure: MINOR REMOVAL OF A TUNNELED DIALYSIS CATHETER;  Surgeon: [name], MD;  Location: [facility];  Service: Vascular;  Laterality: Right;   ROBOT ASSISTED LAPAROSCOPIC NEPHRECTOMY Right 05/16/2020   Procedure: XI ROBOTIC ASSISTED LAPAROSCOPIC NEPHRECTOMY;  Surgeon: Cleon Gustin, MD;  Location: [facility];  Service: Urology;  Laterality: Right;  2.5 hrs    Social History   Socioeconomic History   Marital status: Married    Spouse name: Not on file   Number of children: 2   Years of education: Not on file   Highest education level: Not on file  Occupational History   Occupation: retired  Tobacco Use   Smoking status: Former    Packs/day: 0.50     Years: 20.00    Total pack years: 10.00    Types: Cigarettes    Start date: 02/12/1966    Quit date: 11/08/1984    Years since quitting: 38.1   Smokeless tobacco: Never  Vaping Use   Vaping Use: Never used  Substance and Sexual Activity   Alcohol use: No    Alcohol/week: 0.0 standard drinks of alcohol   Drug use: No   Sexual activity: Yes    Birth control/protection: None  Other Topics Concern   Not on file  Social History Narrative   Full time.    Social Determinants of Health   Financial Resource Strain: Not on file  Food Insecurity: Not on file  Transportation Needs: Not on file  Physical Activity: Not on file  Stress: Not on file  Social Connections: Not on file  Intimate Partner Violence: Not on file    Family History  Problem Relation Age of Onset   Hypertension Mother    Kidney disease Mother    Heart disease Father    Kidney disease Other        Objective: Vitals:   12/30/22 1100  BP: 126/64  Pulse: 94     Physical Exam Vitals reviewed.  Constitutional:      Appearance: Normal appearance.  Lymphadenopathy:     Cervical: No cervical adenopathy.     Upper Body:     Right upper body: No supraclavicular adenopathy.     Left upper body: No supraclavicular adenopathy.  Neurological:     Mental Status: He is alert.     Lab Results:  PSA No results found for: "PSA" No results found for: "TESTOSTERONE"  Results for orders placed or performed in visit on 12/30/22 (from the past 24 hour(s))  Urinalysis, Routine w reflex microscopic     Status: Abnormal   Collection Time: 12/30/22 11:04 AM  Result Value Ref Range   Specific Gravity, UA 1.020 1.005 - 1.030   pH, UA 5.5 5.0 - 7.5   Color, UA Yellow Yellow   Appearance Ur Clear Clear   Leukocytes,UA Negative Negative   Protein,UA 2+ (A) Negative/Trace   Glucose, UA Negative Negative   Ketones, UA Trace (A) Negative   RBC, UA Negative Negative   Bilirubin, UA Negative Negative   Urobilinogen, Ur  0.2 0.2 - 1.0 mg/dL   Nitrite, UA Negative Negative   Microscopic Examination See below:    Narrative   Performed at:  [facility] [address], [place], [place]  [id number] Lab Director: Mina Marble MT, Phone:  [phone]  Microscopic Examination     Status: Abnormal   Collection Time: 12/30/22 11:04 AM   Urine  Result Value Ref Range   WBC, UA 6-10 (A) 0 - 5 /hpf   RBC, Urine 0-2 0 - 2 /hpf   Epithelial Cells (non renal)  0-10 0 - 10 /hpf   Casts Present (A) None seen /lpf   Cast Type Granular casts (A) N/A   Mucus, UA Present (A) Not Estab.   Bacteria, UA None seen None seen/Few   Narrative   Performed at:  [facility] [address] [place], [place]  [id number] Lab Director: Mina Marble MT, Phone:  [phone]     UA is has 6-10 WBC and no bacteria.   Lab Results  Component Value Date   PSA1 0.9 06/10/2022   PSA1 0.8 12/10/2021   PSA1 0.6 04/02/2021     Studies/Results:  CLINICAL DATA:  History of kidney cancer.   EXAM: CHEST - 2 VIEW   COMPARISON:  June 06, 2020   FINDINGS: The heart size and mediastinal contours are stable. Right central venous line is identified unchanged. Mild linear opacities are identified in bilateral lung bases favor atelectasis or scar. No pulmonary edema or pleural effusion is noted. No definite focal pulmonary mass noted. Lungs are hyperinflated. The visualized skeletal structures are stable.   IMPRESSION: Mild linear opacities identified in bilateral lung bases favor atelectasis or scar.     Electronically Signed   By: Abelardo Diesel M.D.   On: 06/25/2022 10:50      Assessment & Plan: LUTS with a history of UTI. He has mild pyuria but no symptoms.    Hx of prostate cancer with prior radiation therapy.  PSA has been rising slowly.  I will repeat today and in 6 months.   Incomplete bladder emptying.  He is doing well on tamsulosin.  Hx of right RCCa with prior nephrectomy.  CXR in  6 months.   Meds ordered this encounter  Medications   DISCONTD: tamsulosin (FLOMAX) 0.4 MG CAPS capsule    Sig: Take 1 capsule (0.4 mg total) by mouth daily.    Dispense:  90 capsule    Refill:  0    Was just refilled on 2/19 but needed to be updated for the year with refills.   tamsulosin (FLOMAX) 0.4 MG CAPS capsule    Sig: Take 1 capsule (0.4 mg total) by mouth daily.    Dispense:  90 capsule    Refill:  3    Was just refilled on 2/19 but needed to be updated for the year with refills.      Orders Placed This Encounter  Procedures   Microscopic Examination   DG Chest 2 View    Standing Status:   Future    Standing Expiration Date:   12/31/2023    Order Specific Question:   Reason for Exam (SYMPTOM  OR DIAGNOSIS REQUIRED)    Answer:   history of kidney cancer.    Order Specific Question:   Preferred imaging location?    Answer:   [facility]    Order Specific Question:   Radiology Contrast Protocol - do NOT remove file path    Answer:   [email or web address]   Urinalysis, Routine w reflex microscopic   PSA   PSA    Standing Status:   Future    Standing Expiration Date:   12/31/2023      Return in about 6 months (around 06/30/2023).   CC: Monico Blitz, MD      Irine Seal 12/31/2022 Patient ID: Todd Robertson, male   DOB: 1946-07-26, 77 y.o.   MRN: [id number]

## 2022-12-31 ENCOUNTER — Encounter: Payer: Self-pay | Admitting: Urology

## 2022-12-31 DIAGNOSIS — N2581 Secondary hyperparathyroidism of renal origin: Secondary | ICD-10-CM | POA: Diagnosis not present

## 2022-12-31 DIAGNOSIS — Z992 Dependence on renal dialysis: Secondary | ICD-10-CM | POA: Diagnosis not present

## 2022-12-31 DIAGNOSIS — N250 Renal osteodystrophy: Secondary | ICD-10-CM | POA: Diagnosis not present

## 2022-12-31 DIAGNOSIS — N186 End stage renal disease: Secondary | ICD-10-CM | POA: Diagnosis not present

## 2022-12-31 DIAGNOSIS — D631 Anemia in chronic kidney disease: Secondary | ICD-10-CM | POA: Diagnosis not present

## 2022-12-31 DIAGNOSIS — D509 Iron deficiency anemia, unspecified: Secondary | ICD-10-CM | POA: Diagnosis not present

## 2023-01-03 DIAGNOSIS — D509 Iron deficiency anemia, unspecified: Secondary | ICD-10-CM | POA: Diagnosis not present

## 2023-01-03 DIAGNOSIS — N250 Renal osteodystrophy: Secondary | ICD-10-CM | POA: Diagnosis not present

## 2023-01-03 DIAGNOSIS — N2581 Secondary hyperparathyroidism of renal origin: Secondary | ICD-10-CM | POA: Diagnosis not present

## 2023-01-03 DIAGNOSIS — Z992 Dependence on renal dialysis: Secondary | ICD-10-CM | POA: Diagnosis not present

## 2023-01-03 DIAGNOSIS — D631 Anemia in chronic kidney disease: Secondary | ICD-10-CM | POA: Diagnosis not present

## 2023-01-03 DIAGNOSIS — N186 End stage renal disease: Secondary | ICD-10-CM | POA: Diagnosis not present

## 2023-01-05 DIAGNOSIS — N2581 Secondary hyperparathyroidism of renal origin: Secondary | ICD-10-CM | POA: Diagnosis not present

## 2023-01-05 DIAGNOSIS — D509 Iron deficiency anemia, unspecified: Secondary | ICD-10-CM | POA: Diagnosis not present

## 2023-01-05 DIAGNOSIS — N186 End stage renal disease: Secondary | ICD-10-CM | POA: Diagnosis not present

## 2023-01-05 DIAGNOSIS — D631 Anemia in chronic kidney disease: Secondary | ICD-10-CM | POA: Diagnosis not present

## 2023-01-05 DIAGNOSIS — N250 Renal osteodystrophy: Secondary | ICD-10-CM | POA: Diagnosis not present

## 2023-01-05 DIAGNOSIS — Z992 Dependence on renal dialysis: Secondary | ICD-10-CM | POA: Diagnosis not present

## 2023-01-06 DIAGNOSIS — Z992 Dependence on renal dialysis: Secondary | ICD-10-CM | POA: Diagnosis not present

## 2023-01-06 DIAGNOSIS — N186 End stage renal disease: Secondary | ICD-10-CM | POA: Diagnosis not present

## 2023-01-07 DIAGNOSIS — D631 Anemia in chronic kidney disease: Secondary | ICD-10-CM | POA: Diagnosis not present

## 2023-01-07 DIAGNOSIS — N186 End stage renal disease: Secondary | ICD-10-CM | POA: Diagnosis not present

## 2023-01-07 DIAGNOSIS — D509 Iron deficiency anemia, unspecified: Secondary | ICD-10-CM | POA: Diagnosis not present

## 2023-01-07 DIAGNOSIS — N250 Renal osteodystrophy: Secondary | ICD-10-CM | POA: Diagnosis not present

## 2023-01-07 DIAGNOSIS — Z992 Dependence on renal dialysis: Secondary | ICD-10-CM | POA: Diagnosis not present

## 2023-01-07 DIAGNOSIS — N2581 Secondary hyperparathyroidism of renal origin: Secondary | ICD-10-CM | POA: Diagnosis not present

## 2023-01-10 DIAGNOSIS — D631 Anemia in chronic kidney disease: Secondary | ICD-10-CM | POA: Diagnosis not present

## 2023-01-10 DIAGNOSIS — D509 Iron deficiency anemia, unspecified: Secondary | ICD-10-CM | POA: Diagnosis not present

## 2023-01-10 DIAGNOSIS — N2581 Secondary hyperparathyroidism of renal origin: Secondary | ICD-10-CM | POA: Diagnosis not present

## 2023-01-10 DIAGNOSIS — Z992 Dependence on renal dialysis: Secondary | ICD-10-CM | POA: Diagnosis not present

## 2023-01-10 DIAGNOSIS — N186 End stage renal disease: Secondary | ICD-10-CM | POA: Diagnosis not present

## 2023-01-12 DIAGNOSIS — Z992 Dependence on renal dialysis: Secondary | ICD-10-CM | POA: Diagnosis not present

## 2023-01-12 DIAGNOSIS — N186 End stage renal disease: Secondary | ICD-10-CM | POA: Diagnosis not present

## 2023-01-12 DIAGNOSIS — D631 Anemia in chronic kidney disease: Secondary | ICD-10-CM | POA: Diagnosis not present

## 2023-01-12 DIAGNOSIS — D509 Iron deficiency anemia, unspecified: Secondary | ICD-10-CM | POA: Diagnosis not present

## 2023-01-12 DIAGNOSIS — N2581 Secondary hyperparathyroidism of renal origin: Secondary | ICD-10-CM | POA: Diagnosis not present

## 2023-01-14 DIAGNOSIS — Z992 Dependence on renal dialysis: Secondary | ICD-10-CM | POA: Diagnosis not present

## 2023-01-14 DIAGNOSIS — N186 End stage renal disease: Secondary | ICD-10-CM | POA: Diagnosis not present

## 2023-01-14 DIAGNOSIS — D509 Iron deficiency anemia, unspecified: Secondary | ICD-10-CM | POA: Diagnosis not present

## 2023-01-14 DIAGNOSIS — N2581 Secondary hyperparathyroidism of renal origin: Secondary | ICD-10-CM | POA: Diagnosis not present

## 2023-01-14 DIAGNOSIS — D631 Anemia in chronic kidney disease: Secondary | ICD-10-CM | POA: Diagnosis not present

## 2023-01-17 DIAGNOSIS — D509 Iron deficiency anemia, unspecified: Secondary | ICD-10-CM | POA: Diagnosis not present

## 2023-01-17 DIAGNOSIS — Z992 Dependence on renal dialysis: Secondary | ICD-10-CM | POA: Diagnosis not present

## 2023-01-17 DIAGNOSIS — N186 End stage renal disease: Secondary | ICD-10-CM | POA: Diagnosis not present

## 2023-01-17 DIAGNOSIS — D631 Anemia in chronic kidney disease: Secondary | ICD-10-CM | POA: Diagnosis not present

## 2023-01-17 DIAGNOSIS — N2581 Secondary hyperparathyroidism of renal origin: Secondary | ICD-10-CM | POA: Diagnosis not present

## 2023-01-19 DIAGNOSIS — N186 End stage renal disease: Secondary | ICD-10-CM | POA: Diagnosis not present

## 2023-01-19 DIAGNOSIS — Z992 Dependence on renal dialysis: Secondary | ICD-10-CM | POA: Diagnosis not present

## 2023-01-19 DIAGNOSIS — D631 Anemia in chronic kidney disease: Secondary | ICD-10-CM | POA: Diagnosis not present

## 2023-01-19 DIAGNOSIS — N2581 Secondary hyperparathyroidism of renal origin: Secondary | ICD-10-CM | POA: Diagnosis not present

## 2023-01-19 DIAGNOSIS — D509 Iron deficiency anemia, unspecified: Secondary | ICD-10-CM | POA: Diagnosis not present

## 2023-01-21 DIAGNOSIS — D631 Anemia in chronic kidney disease: Secondary | ICD-10-CM | POA: Diagnosis not present

## 2023-01-21 DIAGNOSIS — N186 End stage renal disease: Secondary | ICD-10-CM | POA: Diagnosis not present

## 2023-01-21 DIAGNOSIS — D509 Iron deficiency anemia, unspecified: Secondary | ICD-10-CM | POA: Diagnosis not present

## 2023-01-21 DIAGNOSIS — N2581 Secondary hyperparathyroidism of renal origin: Secondary | ICD-10-CM | POA: Diagnosis not present

## 2023-01-21 DIAGNOSIS — Z992 Dependence on renal dialysis: Secondary | ICD-10-CM | POA: Diagnosis not present

## 2023-01-24 DIAGNOSIS — N2581 Secondary hyperparathyroidism of renal origin: Secondary | ICD-10-CM | POA: Diagnosis not present

## 2023-01-24 DIAGNOSIS — N186 End stage renal disease: Secondary | ICD-10-CM | POA: Diagnosis not present

## 2023-01-24 DIAGNOSIS — D509 Iron deficiency anemia, unspecified: Secondary | ICD-10-CM | POA: Diagnosis not present

## 2023-01-24 DIAGNOSIS — D631 Anemia in chronic kidney disease: Secondary | ICD-10-CM | POA: Diagnosis not present

## 2023-01-24 DIAGNOSIS — Z992 Dependence on renal dialysis: Secondary | ICD-10-CM | POA: Diagnosis not present

## 2023-01-26 DIAGNOSIS — D631 Anemia in chronic kidney disease: Secondary | ICD-10-CM | POA: Diagnosis not present

## 2023-01-26 DIAGNOSIS — D509 Iron deficiency anemia, unspecified: Secondary | ICD-10-CM | POA: Diagnosis not present

## 2023-01-26 DIAGNOSIS — N186 End stage renal disease: Secondary | ICD-10-CM | POA: Diagnosis not present

## 2023-01-26 DIAGNOSIS — N2581 Secondary hyperparathyroidism of renal origin: Secondary | ICD-10-CM | POA: Diagnosis not present

## 2023-01-26 DIAGNOSIS — Z992 Dependence on renal dialysis: Secondary | ICD-10-CM | POA: Diagnosis not present

## 2023-01-28 DIAGNOSIS — N186 End stage renal disease: Secondary | ICD-10-CM | POA: Diagnosis not present

## 2023-01-28 DIAGNOSIS — N2581 Secondary hyperparathyroidism of renal origin: Secondary | ICD-10-CM | POA: Diagnosis not present

## 2023-01-28 DIAGNOSIS — Z992 Dependence on renal dialysis: Secondary | ICD-10-CM | POA: Diagnosis not present

## 2023-01-28 DIAGNOSIS — D631 Anemia in chronic kidney disease: Secondary | ICD-10-CM | POA: Diagnosis not present

## 2023-01-28 DIAGNOSIS — D509 Iron deficiency anemia, unspecified: Secondary | ICD-10-CM | POA: Diagnosis not present

## 2023-01-31 DIAGNOSIS — Z992 Dependence on renal dialysis: Secondary | ICD-10-CM | POA: Diagnosis not present

## 2023-01-31 DIAGNOSIS — D631 Anemia in chronic kidney disease: Secondary | ICD-10-CM | POA: Diagnosis not present

## 2023-01-31 DIAGNOSIS — D509 Iron deficiency anemia, unspecified: Secondary | ICD-10-CM | POA: Diagnosis not present

## 2023-01-31 DIAGNOSIS — N186 End stage renal disease: Secondary | ICD-10-CM | POA: Diagnosis not present

## 2023-01-31 DIAGNOSIS — N2581 Secondary hyperparathyroidism of renal origin: Secondary | ICD-10-CM | POA: Diagnosis not present

## 2023-02-02 DIAGNOSIS — D509 Iron deficiency anemia, unspecified: Secondary | ICD-10-CM | POA: Diagnosis not present

## 2023-02-02 DIAGNOSIS — Z992 Dependence on renal dialysis: Secondary | ICD-10-CM | POA: Diagnosis not present

## 2023-02-02 DIAGNOSIS — D631 Anemia in chronic kidney disease: Secondary | ICD-10-CM | POA: Diagnosis not present

## 2023-02-02 DIAGNOSIS — N186 End stage renal disease: Secondary | ICD-10-CM | POA: Diagnosis not present

## 2023-02-02 DIAGNOSIS — N2581 Secondary hyperparathyroidism of renal origin: Secondary | ICD-10-CM | POA: Diagnosis not present

## 2023-02-04 DIAGNOSIS — Z992 Dependence on renal dialysis: Secondary | ICD-10-CM | POA: Diagnosis not present

## 2023-02-04 DIAGNOSIS — D631 Anemia in chronic kidney disease: Secondary | ICD-10-CM | POA: Diagnosis not present

## 2023-02-04 DIAGNOSIS — D509 Iron deficiency anemia, unspecified: Secondary | ICD-10-CM | POA: Diagnosis not present

## 2023-02-04 DIAGNOSIS — N186 End stage renal disease: Secondary | ICD-10-CM | POA: Diagnosis not present

## 2023-02-04 DIAGNOSIS — N2581 Secondary hyperparathyroidism of renal origin: Secondary | ICD-10-CM | POA: Diagnosis not present

## 2023-02-06 DIAGNOSIS — I10 Essential (primary) hypertension: Secondary | ICD-10-CM | POA: Diagnosis not present

## 2023-02-06 DIAGNOSIS — N186 End stage renal disease: Secondary | ICD-10-CM | POA: Diagnosis not present

## 2023-02-06 DIAGNOSIS — E7800 Pure hypercholesterolemia, unspecified: Secondary | ICD-10-CM | POA: Diagnosis not present

## 2023-02-06 DIAGNOSIS — Z992 Dependence on renal dialysis: Secondary | ICD-10-CM | POA: Diagnosis not present

## 2023-02-06 DIAGNOSIS — J449 Chronic obstructive pulmonary disease, unspecified: Secondary | ICD-10-CM | POA: Diagnosis not present

## 2023-02-07 DIAGNOSIS — E1151 Type 2 diabetes mellitus with diabetic peripheral angiopathy without gangrene: Secondary | ICD-10-CM | POA: Diagnosis not present

## 2023-02-07 DIAGNOSIS — N186 End stage renal disease: Secondary | ICD-10-CM | POA: Diagnosis not present

## 2023-02-07 DIAGNOSIS — N250 Renal osteodystrophy: Secondary | ICD-10-CM | POA: Diagnosis not present

## 2023-02-07 DIAGNOSIS — D631 Anemia in chronic kidney disease: Secondary | ICD-10-CM | POA: Diagnosis not present

## 2023-02-07 DIAGNOSIS — Z992 Dependence on renal dialysis: Secondary | ICD-10-CM | POA: Diagnosis not present

## 2023-02-07 DIAGNOSIS — Z23 Encounter for immunization: Secondary | ICD-10-CM | POA: Diagnosis not present

## 2023-02-07 DIAGNOSIS — E1140 Type 2 diabetes mellitus with diabetic neuropathy, unspecified: Secondary | ICD-10-CM | POA: Diagnosis not present

## 2023-02-07 DIAGNOSIS — L110 Acquired keratosis follicularis: Secondary | ICD-10-CM | POA: Diagnosis not present

## 2023-02-07 DIAGNOSIS — D509 Iron deficiency anemia, unspecified: Secondary | ICD-10-CM | POA: Diagnosis not present

## 2023-02-07 DIAGNOSIS — N2581 Secondary hyperparathyroidism of renal origin: Secondary | ICD-10-CM | POA: Diagnosis not present

## 2023-02-07 DIAGNOSIS — B351 Tinea unguium: Secondary | ICD-10-CM | POA: Diagnosis not present

## 2023-02-09 DIAGNOSIS — N186 End stage renal disease: Secondary | ICD-10-CM | POA: Diagnosis not present

## 2023-02-09 DIAGNOSIS — Z992 Dependence on renal dialysis: Secondary | ICD-10-CM | POA: Diagnosis not present

## 2023-02-09 DIAGNOSIS — D631 Anemia in chronic kidney disease: Secondary | ICD-10-CM | POA: Diagnosis not present

## 2023-02-09 DIAGNOSIS — Z23 Encounter for immunization: Secondary | ICD-10-CM | POA: Diagnosis not present

## 2023-02-09 DIAGNOSIS — N2581 Secondary hyperparathyroidism of renal origin: Secondary | ICD-10-CM | POA: Diagnosis not present

## 2023-02-09 DIAGNOSIS — D509 Iron deficiency anemia, unspecified: Secondary | ICD-10-CM | POA: Diagnosis not present

## 2023-02-11 DIAGNOSIS — D631 Anemia in chronic kidney disease: Secondary | ICD-10-CM | POA: Diagnosis not present

## 2023-02-11 DIAGNOSIS — Z992 Dependence on renal dialysis: Secondary | ICD-10-CM | POA: Diagnosis not present

## 2023-02-11 DIAGNOSIS — D509 Iron deficiency anemia, unspecified: Secondary | ICD-10-CM | POA: Diagnosis not present

## 2023-02-11 DIAGNOSIS — N186 End stage renal disease: Secondary | ICD-10-CM | POA: Diagnosis not present

## 2023-02-11 DIAGNOSIS — N2581 Secondary hyperparathyroidism of renal origin: Secondary | ICD-10-CM | POA: Diagnosis not present

## 2023-02-11 DIAGNOSIS — Z23 Encounter for immunization: Secondary | ICD-10-CM | POA: Diagnosis not present

## 2023-02-14 DIAGNOSIS — D509 Iron deficiency anemia, unspecified: Secondary | ICD-10-CM | POA: Diagnosis not present

## 2023-02-14 DIAGNOSIS — Z23 Encounter for immunization: Secondary | ICD-10-CM | POA: Diagnosis not present

## 2023-02-14 DIAGNOSIS — N2581 Secondary hyperparathyroidism of renal origin: Secondary | ICD-10-CM | POA: Diagnosis not present

## 2023-02-14 DIAGNOSIS — Z992 Dependence on renal dialysis: Secondary | ICD-10-CM | POA: Diagnosis not present

## 2023-02-14 DIAGNOSIS — D631 Anemia in chronic kidney disease: Secondary | ICD-10-CM | POA: Diagnosis not present

## 2023-02-14 DIAGNOSIS — N186 End stage renal disease: Secondary | ICD-10-CM | POA: Diagnosis not present

## 2023-02-16 DIAGNOSIS — N2581 Secondary hyperparathyroidism of renal origin: Secondary | ICD-10-CM | POA: Diagnosis not present

## 2023-02-16 DIAGNOSIS — Z992 Dependence on renal dialysis: Secondary | ICD-10-CM | POA: Diagnosis not present

## 2023-02-16 DIAGNOSIS — D631 Anemia in chronic kidney disease: Secondary | ICD-10-CM | POA: Diagnosis not present

## 2023-02-16 DIAGNOSIS — Z23 Encounter for immunization: Secondary | ICD-10-CM | POA: Diagnosis not present

## 2023-02-16 DIAGNOSIS — D509 Iron deficiency anemia, unspecified: Secondary | ICD-10-CM | POA: Diagnosis not present

## 2023-02-16 DIAGNOSIS — N186 End stage renal disease: Secondary | ICD-10-CM | POA: Diagnosis not present

## 2023-02-18 DIAGNOSIS — N186 End stage renal disease: Secondary | ICD-10-CM | POA: Diagnosis not present

## 2023-02-18 DIAGNOSIS — N2581 Secondary hyperparathyroidism of renal origin: Secondary | ICD-10-CM | POA: Diagnosis not present

## 2023-02-18 DIAGNOSIS — D631 Anemia in chronic kidney disease: Secondary | ICD-10-CM | POA: Diagnosis not present

## 2023-02-18 DIAGNOSIS — Z992 Dependence on renal dialysis: Secondary | ICD-10-CM | POA: Diagnosis not present

## 2023-02-18 DIAGNOSIS — Z23 Encounter for immunization: Secondary | ICD-10-CM | POA: Diagnosis not present

## 2023-02-18 DIAGNOSIS — D509 Iron deficiency anemia, unspecified: Secondary | ICD-10-CM | POA: Diagnosis not present

## 2023-02-21 DIAGNOSIS — D509 Iron deficiency anemia, unspecified: Secondary | ICD-10-CM | POA: Diagnosis not present

## 2023-02-21 DIAGNOSIS — N2581 Secondary hyperparathyroidism of renal origin: Secondary | ICD-10-CM | POA: Diagnosis not present

## 2023-02-21 DIAGNOSIS — D631 Anemia in chronic kidney disease: Secondary | ICD-10-CM | POA: Diagnosis not present

## 2023-02-21 DIAGNOSIS — Z992 Dependence on renal dialysis: Secondary | ICD-10-CM | POA: Diagnosis not present

## 2023-02-21 DIAGNOSIS — Z23 Encounter for immunization: Secondary | ICD-10-CM | POA: Diagnosis not present

## 2023-02-21 DIAGNOSIS — N186 End stage renal disease: Secondary | ICD-10-CM | POA: Diagnosis not present

## 2023-02-23 DIAGNOSIS — Z992 Dependence on renal dialysis: Secondary | ICD-10-CM | POA: Diagnosis not present

## 2023-02-23 DIAGNOSIS — N186 End stage renal disease: Secondary | ICD-10-CM | POA: Diagnosis not present

## 2023-02-23 DIAGNOSIS — Z23 Encounter for immunization: Secondary | ICD-10-CM | POA: Diagnosis not present

## 2023-02-23 DIAGNOSIS — D631 Anemia in chronic kidney disease: Secondary | ICD-10-CM | POA: Diagnosis not present

## 2023-02-23 DIAGNOSIS — N2581 Secondary hyperparathyroidism of renal origin: Secondary | ICD-10-CM | POA: Diagnosis not present

## 2023-02-23 DIAGNOSIS — D509 Iron deficiency anemia, unspecified: Secondary | ICD-10-CM | POA: Diagnosis not present

## 2023-02-25 DIAGNOSIS — N2581 Secondary hyperparathyroidism of renal origin: Secondary | ICD-10-CM | POA: Diagnosis not present

## 2023-02-25 DIAGNOSIS — D631 Anemia in chronic kidney disease: Secondary | ICD-10-CM | POA: Diagnosis not present

## 2023-02-25 DIAGNOSIS — N186 End stage renal disease: Secondary | ICD-10-CM | POA: Diagnosis not present

## 2023-02-25 DIAGNOSIS — Z23 Encounter for immunization: Secondary | ICD-10-CM | POA: Diagnosis not present

## 2023-02-25 DIAGNOSIS — Z992 Dependence on renal dialysis: Secondary | ICD-10-CM | POA: Diagnosis not present

## 2023-02-25 DIAGNOSIS — D509 Iron deficiency anemia, unspecified: Secondary | ICD-10-CM | POA: Diagnosis not present

## 2023-02-28 DIAGNOSIS — Z23 Encounter for immunization: Secondary | ICD-10-CM | POA: Diagnosis not present

## 2023-02-28 DIAGNOSIS — D509 Iron deficiency anemia, unspecified: Secondary | ICD-10-CM | POA: Diagnosis not present

## 2023-02-28 DIAGNOSIS — Z992 Dependence on renal dialysis: Secondary | ICD-10-CM | POA: Diagnosis not present

## 2023-02-28 DIAGNOSIS — N186 End stage renal disease: Secondary | ICD-10-CM | POA: Diagnosis not present

## 2023-02-28 DIAGNOSIS — N2581 Secondary hyperparathyroidism of renal origin: Secondary | ICD-10-CM | POA: Diagnosis not present

## 2023-02-28 DIAGNOSIS — D631 Anemia in chronic kidney disease: Secondary | ICD-10-CM | POA: Diagnosis not present

## 2023-03-02 DIAGNOSIS — D509 Iron deficiency anemia, unspecified: Secondary | ICD-10-CM | POA: Diagnosis not present

## 2023-03-02 DIAGNOSIS — N186 End stage renal disease: Secondary | ICD-10-CM | POA: Diagnosis not present

## 2023-03-02 DIAGNOSIS — D631 Anemia in chronic kidney disease: Secondary | ICD-10-CM | POA: Diagnosis not present

## 2023-03-02 DIAGNOSIS — N2581 Secondary hyperparathyroidism of renal origin: Secondary | ICD-10-CM | POA: Diagnosis not present

## 2023-03-02 DIAGNOSIS — Z23 Encounter for immunization: Secondary | ICD-10-CM | POA: Diagnosis not present

## 2023-03-02 DIAGNOSIS — Z992 Dependence on renal dialysis: Secondary | ICD-10-CM | POA: Diagnosis not present

## 2023-03-04 DIAGNOSIS — N186 End stage renal disease: Secondary | ICD-10-CM | POA: Diagnosis not present

## 2023-03-04 DIAGNOSIS — D509 Iron deficiency anemia, unspecified: Secondary | ICD-10-CM | POA: Diagnosis not present

## 2023-03-04 DIAGNOSIS — Z23 Encounter for immunization: Secondary | ICD-10-CM | POA: Diagnosis not present

## 2023-03-04 DIAGNOSIS — D631 Anemia in chronic kidney disease: Secondary | ICD-10-CM | POA: Diagnosis not present

## 2023-03-04 DIAGNOSIS — N2581 Secondary hyperparathyroidism of renal origin: Secondary | ICD-10-CM | POA: Diagnosis not present

## 2023-03-04 DIAGNOSIS — Z992 Dependence on renal dialysis: Secondary | ICD-10-CM | POA: Diagnosis not present

## 2023-03-07 DIAGNOSIS — N2581 Secondary hyperparathyroidism of renal origin: Secondary | ICD-10-CM | POA: Diagnosis not present

## 2023-03-07 DIAGNOSIS — D631 Anemia in chronic kidney disease: Secondary | ICD-10-CM | POA: Diagnosis not present

## 2023-03-07 DIAGNOSIS — Z992 Dependence on renal dialysis: Secondary | ICD-10-CM | POA: Diagnosis not present

## 2023-03-07 DIAGNOSIS — Z23 Encounter for immunization: Secondary | ICD-10-CM | POA: Diagnosis not present

## 2023-03-07 DIAGNOSIS — D509 Iron deficiency anemia, unspecified: Secondary | ICD-10-CM | POA: Diagnosis not present

## 2023-03-07 DIAGNOSIS — N186 End stage renal disease: Secondary | ICD-10-CM | POA: Diagnosis not present

## 2023-03-08 DIAGNOSIS — N186 End stage renal disease: Secondary | ICD-10-CM | POA: Diagnosis not present

## 2023-03-08 DIAGNOSIS — Z992 Dependence on renal dialysis: Secondary | ICD-10-CM | POA: Diagnosis not present

## 2023-03-09 DIAGNOSIS — Z992 Dependence on renal dialysis: Secondary | ICD-10-CM | POA: Diagnosis not present

## 2023-03-09 DIAGNOSIS — D509 Iron deficiency anemia, unspecified: Secondary | ICD-10-CM | POA: Diagnosis not present

## 2023-03-09 DIAGNOSIS — N2581 Secondary hyperparathyroidism of renal origin: Secondary | ICD-10-CM | POA: Diagnosis not present

## 2023-03-09 DIAGNOSIS — N250 Renal osteodystrophy: Secondary | ICD-10-CM | POA: Diagnosis not present

## 2023-03-09 DIAGNOSIS — N186 End stage renal disease: Secondary | ICD-10-CM | POA: Diagnosis not present

## 2023-03-09 DIAGNOSIS — D631 Anemia in chronic kidney disease: Secondary | ICD-10-CM | POA: Diagnosis not present

## 2023-03-11 DIAGNOSIS — Z992 Dependence on renal dialysis: Secondary | ICD-10-CM | POA: Diagnosis not present

## 2023-03-11 DIAGNOSIS — N2581 Secondary hyperparathyroidism of renal origin: Secondary | ICD-10-CM | POA: Diagnosis not present

## 2023-03-11 DIAGNOSIS — D631 Anemia in chronic kidney disease: Secondary | ICD-10-CM | POA: Diagnosis not present

## 2023-03-11 DIAGNOSIS — N250 Renal osteodystrophy: Secondary | ICD-10-CM | POA: Diagnosis not present

## 2023-03-11 DIAGNOSIS — N186 End stage renal disease: Secondary | ICD-10-CM | POA: Diagnosis not present

## 2023-03-11 DIAGNOSIS — D509 Iron deficiency anemia, unspecified: Secondary | ICD-10-CM | POA: Diagnosis not present

## 2023-03-14 DIAGNOSIS — D631 Anemia in chronic kidney disease: Secondary | ICD-10-CM | POA: Diagnosis not present

## 2023-03-14 DIAGNOSIS — N186 End stage renal disease: Secondary | ICD-10-CM | POA: Diagnosis not present

## 2023-03-14 DIAGNOSIS — D509 Iron deficiency anemia, unspecified: Secondary | ICD-10-CM | POA: Diagnosis not present

## 2023-03-14 DIAGNOSIS — Z992 Dependence on renal dialysis: Secondary | ICD-10-CM | POA: Diagnosis not present

## 2023-03-14 DIAGNOSIS — N250 Renal osteodystrophy: Secondary | ICD-10-CM | POA: Diagnosis not present

## 2023-03-14 DIAGNOSIS — N2581 Secondary hyperparathyroidism of renal origin: Secondary | ICD-10-CM | POA: Diagnosis not present

## 2023-03-16 DIAGNOSIS — I10 Essential (primary) hypertension: Secondary | ICD-10-CM | POA: Diagnosis not present

## 2023-03-16 DIAGNOSIS — E1165 Type 2 diabetes mellitus with hyperglycemia: Secondary | ICD-10-CM | POA: Diagnosis not present

## 2023-03-16 DIAGNOSIS — G2581 Restless legs syndrome: Secondary | ICD-10-CM | POA: Diagnosis not present

## 2023-03-16 DIAGNOSIS — E1122 Type 2 diabetes mellitus with diabetic chronic kidney disease: Secondary | ICD-10-CM | POA: Diagnosis not present

## 2023-03-16 DIAGNOSIS — D509 Iron deficiency anemia, unspecified: Secondary | ICD-10-CM | POA: Diagnosis not present

## 2023-03-16 DIAGNOSIS — Z299 Encounter for prophylactic measures, unspecified: Secondary | ICD-10-CM | POA: Diagnosis not present

## 2023-03-16 DIAGNOSIS — N2581 Secondary hyperparathyroidism of renal origin: Secondary | ICD-10-CM | POA: Diagnosis not present

## 2023-03-16 DIAGNOSIS — N186 End stage renal disease: Secondary | ICD-10-CM | POA: Diagnosis not present

## 2023-03-16 DIAGNOSIS — N250 Renal osteodystrophy: Secondary | ICD-10-CM | POA: Diagnosis not present

## 2023-03-16 DIAGNOSIS — D631 Anemia in chronic kidney disease: Secondary | ICD-10-CM | POA: Diagnosis not present

## 2023-03-16 DIAGNOSIS — Z992 Dependence on renal dialysis: Secondary | ICD-10-CM | POA: Diagnosis not present

## 2023-03-18 DIAGNOSIS — Z992 Dependence on renal dialysis: Secondary | ICD-10-CM | POA: Diagnosis not present

## 2023-03-18 DIAGNOSIS — N2581 Secondary hyperparathyroidism of renal origin: Secondary | ICD-10-CM | POA: Diagnosis not present

## 2023-03-18 DIAGNOSIS — D509 Iron deficiency anemia, unspecified: Secondary | ICD-10-CM | POA: Diagnosis not present

## 2023-03-18 DIAGNOSIS — D631 Anemia in chronic kidney disease: Secondary | ICD-10-CM | POA: Diagnosis not present

## 2023-03-18 DIAGNOSIS — N186 End stage renal disease: Secondary | ICD-10-CM | POA: Diagnosis not present

## 2023-03-18 DIAGNOSIS — N250 Renal osteodystrophy: Secondary | ICD-10-CM | POA: Diagnosis not present

## 2023-03-21 DIAGNOSIS — D631 Anemia in chronic kidney disease: Secondary | ICD-10-CM | POA: Diagnosis not present

## 2023-03-21 DIAGNOSIS — D509 Iron deficiency anemia, unspecified: Secondary | ICD-10-CM | POA: Diagnosis not present

## 2023-03-21 DIAGNOSIS — Z992 Dependence on renal dialysis: Secondary | ICD-10-CM | POA: Diagnosis not present

## 2023-03-21 DIAGNOSIS — N186 End stage renal disease: Secondary | ICD-10-CM | POA: Diagnosis not present

## 2023-03-21 DIAGNOSIS — N250 Renal osteodystrophy: Secondary | ICD-10-CM | POA: Diagnosis not present

## 2023-03-21 DIAGNOSIS — N2581 Secondary hyperparathyroidism of renal origin: Secondary | ICD-10-CM | POA: Diagnosis not present

## 2023-03-23 DIAGNOSIS — D631 Anemia in chronic kidney disease: Secondary | ICD-10-CM | POA: Diagnosis not present

## 2023-03-23 DIAGNOSIS — D509 Iron deficiency anemia, unspecified: Secondary | ICD-10-CM | POA: Diagnosis not present

## 2023-03-23 DIAGNOSIS — N186 End stage renal disease: Secondary | ICD-10-CM | POA: Diagnosis not present

## 2023-03-23 DIAGNOSIS — Z992 Dependence on renal dialysis: Secondary | ICD-10-CM | POA: Diagnosis not present

## 2023-03-23 DIAGNOSIS — N2581 Secondary hyperparathyroidism of renal origin: Secondary | ICD-10-CM | POA: Diagnosis not present

## 2023-03-23 DIAGNOSIS — N250 Renal osteodystrophy: Secondary | ICD-10-CM | POA: Diagnosis not present

## 2023-03-24 DIAGNOSIS — Z992 Dependence on renal dialysis: Secondary | ICD-10-CM | POA: Diagnosis not present

## 2023-03-24 DIAGNOSIS — J449 Chronic obstructive pulmonary disease, unspecified: Secondary | ICD-10-CM | POA: Diagnosis not present

## 2023-03-24 DIAGNOSIS — Z299 Encounter for prophylactic measures, unspecified: Secondary | ICD-10-CM | POA: Diagnosis not present

## 2023-03-24 DIAGNOSIS — R062 Wheezing: Secondary | ICD-10-CM | POA: Diagnosis not present

## 2023-03-24 DIAGNOSIS — N186 End stage renal disease: Secondary | ICD-10-CM | POA: Diagnosis not present

## 2023-03-25 DIAGNOSIS — N2581 Secondary hyperparathyroidism of renal origin: Secondary | ICD-10-CM | POA: Diagnosis not present

## 2023-03-25 DIAGNOSIS — D631 Anemia in chronic kidney disease: Secondary | ICD-10-CM | POA: Diagnosis not present

## 2023-03-25 DIAGNOSIS — N186 End stage renal disease: Secondary | ICD-10-CM | POA: Diagnosis not present

## 2023-03-25 DIAGNOSIS — D509 Iron deficiency anemia, unspecified: Secondary | ICD-10-CM | POA: Diagnosis not present

## 2023-03-25 DIAGNOSIS — N250 Renal osteodystrophy: Secondary | ICD-10-CM | POA: Diagnosis not present

## 2023-03-25 DIAGNOSIS — Z992 Dependence on renal dialysis: Secondary | ICD-10-CM | POA: Diagnosis not present

## 2023-03-28 DIAGNOSIS — N186 End stage renal disease: Secondary | ICD-10-CM | POA: Diagnosis not present

## 2023-03-28 DIAGNOSIS — N250 Renal osteodystrophy: Secondary | ICD-10-CM | POA: Diagnosis not present

## 2023-03-28 DIAGNOSIS — D509 Iron deficiency anemia, unspecified: Secondary | ICD-10-CM | POA: Diagnosis not present

## 2023-03-28 DIAGNOSIS — Z992 Dependence on renal dialysis: Secondary | ICD-10-CM | POA: Diagnosis not present

## 2023-03-28 DIAGNOSIS — N2581 Secondary hyperparathyroidism of renal origin: Secondary | ICD-10-CM | POA: Diagnosis not present

## 2023-03-28 DIAGNOSIS — D631 Anemia in chronic kidney disease: Secondary | ICD-10-CM | POA: Diagnosis not present

## 2023-03-30 DIAGNOSIS — Z992 Dependence on renal dialysis: Secondary | ICD-10-CM | POA: Diagnosis not present

## 2023-03-30 DIAGNOSIS — N250 Renal osteodystrophy: Secondary | ICD-10-CM | POA: Diagnosis not present

## 2023-03-30 DIAGNOSIS — N2581 Secondary hyperparathyroidism of renal origin: Secondary | ICD-10-CM | POA: Diagnosis not present

## 2023-03-30 DIAGNOSIS — D509 Iron deficiency anemia, unspecified: Secondary | ICD-10-CM | POA: Diagnosis not present

## 2023-03-30 DIAGNOSIS — N186 End stage renal disease: Secondary | ICD-10-CM | POA: Diagnosis not present

## 2023-03-30 DIAGNOSIS — D631 Anemia in chronic kidney disease: Secondary | ICD-10-CM | POA: Diagnosis not present

## 2023-04-01 DIAGNOSIS — N186 End stage renal disease: Secondary | ICD-10-CM | POA: Diagnosis not present

## 2023-04-01 DIAGNOSIS — Z992 Dependence on renal dialysis: Secondary | ICD-10-CM | POA: Diagnosis not present

## 2023-04-01 DIAGNOSIS — D509 Iron deficiency anemia, unspecified: Secondary | ICD-10-CM | POA: Diagnosis not present

## 2023-04-01 DIAGNOSIS — N250 Renal osteodystrophy: Secondary | ICD-10-CM | POA: Diagnosis not present

## 2023-04-01 DIAGNOSIS — N2581 Secondary hyperparathyroidism of renal origin: Secondary | ICD-10-CM | POA: Diagnosis not present

## 2023-04-01 DIAGNOSIS — D631 Anemia in chronic kidney disease: Secondary | ICD-10-CM | POA: Diagnosis not present

## 2023-04-04 DIAGNOSIS — Z992 Dependence on renal dialysis: Secondary | ICD-10-CM | POA: Diagnosis not present

## 2023-04-04 DIAGNOSIS — D631 Anemia in chronic kidney disease: Secondary | ICD-10-CM | POA: Diagnosis not present

## 2023-04-04 DIAGNOSIS — N250 Renal osteodystrophy: Secondary | ICD-10-CM | POA: Diagnosis not present

## 2023-04-04 DIAGNOSIS — D509 Iron deficiency anemia, unspecified: Secondary | ICD-10-CM | POA: Diagnosis not present

## 2023-04-04 DIAGNOSIS — N2581 Secondary hyperparathyroidism of renal origin: Secondary | ICD-10-CM | POA: Diagnosis not present

## 2023-04-04 DIAGNOSIS — N186 End stage renal disease: Secondary | ICD-10-CM | POA: Diagnosis not present

## 2023-04-06 DIAGNOSIS — N2581 Secondary hyperparathyroidism of renal origin: Secondary | ICD-10-CM | POA: Diagnosis not present

## 2023-04-06 DIAGNOSIS — N186 End stage renal disease: Secondary | ICD-10-CM | POA: Diagnosis not present

## 2023-04-06 DIAGNOSIS — Z992 Dependence on renal dialysis: Secondary | ICD-10-CM | POA: Diagnosis not present

## 2023-04-06 DIAGNOSIS — D509 Iron deficiency anemia, unspecified: Secondary | ICD-10-CM | POA: Diagnosis not present

## 2023-04-06 DIAGNOSIS — D631 Anemia in chronic kidney disease: Secondary | ICD-10-CM | POA: Diagnosis not present

## 2023-04-06 DIAGNOSIS — N250 Renal osteodystrophy: Secondary | ICD-10-CM | POA: Diagnosis not present

## 2023-04-08 DIAGNOSIS — Z992 Dependence on renal dialysis: Secondary | ICD-10-CM | POA: Diagnosis not present

## 2023-04-08 DIAGNOSIS — D509 Iron deficiency anemia, unspecified: Secondary | ICD-10-CM | POA: Diagnosis not present

## 2023-04-08 DIAGNOSIS — D631 Anemia in chronic kidney disease: Secondary | ICD-10-CM | POA: Diagnosis not present

## 2023-04-08 DIAGNOSIS — N2581 Secondary hyperparathyroidism of renal origin: Secondary | ICD-10-CM | POA: Diagnosis not present

## 2023-04-08 DIAGNOSIS — N250 Renal osteodystrophy: Secondary | ICD-10-CM | POA: Diagnosis not present

## 2023-04-08 DIAGNOSIS — N186 End stage renal disease: Secondary | ICD-10-CM | POA: Diagnosis not present

## 2023-04-09 DIAGNOSIS — D631 Anemia in chronic kidney disease: Secondary | ICD-10-CM | POA: Diagnosis not present

## 2023-04-09 DIAGNOSIS — Z992 Dependence on renal dialysis: Secondary | ICD-10-CM | POA: Diagnosis not present

## 2023-04-09 DIAGNOSIS — N186 End stage renal disease: Secondary | ICD-10-CM | POA: Diagnosis not present

## 2023-04-09 DIAGNOSIS — D509 Iron deficiency anemia, unspecified: Secondary | ICD-10-CM | POA: Diagnosis not present

## 2023-04-09 DIAGNOSIS — N250 Renal osteodystrophy: Secondary | ICD-10-CM | POA: Diagnosis not present

## 2023-04-09 DIAGNOSIS — N2581 Secondary hyperparathyroidism of renal origin: Secondary | ICD-10-CM | POA: Diagnosis not present

## 2023-04-11 DIAGNOSIS — D631 Anemia in chronic kidney disease: Secondary | ICD-10-CM | POA: Diagnosis not present

## 2023-04-11 DIAGNOSIS — N186 End stage renal disease: Secondary | ICD-10-CM | POA: Diagnosis not present

## 2023-04-11 DIAGNOSIS — Z992 Dependence on renal dialysis: Secondary | ICD-10-CM | POA: Diagnosis not present

## 2023-04-11 DIAGNOSIS — N2581 Secondary hyperparathyroidism of renal origin: Secondary | ICD-10-CM | POA: Diagnosis not present

## 2023-04-11 DIAGNOSIS — D509 Iron deficiency anemia, unspecified: Secondary | ICD-10-CM | POA: Diagnosis not present

## 2023-04-13 DIAGNOSIS — Z992 Dependence on renal dialysis: Secondary | ICD-10-CM | POA: Diagnosis not present

## 2023-04-13 DIAGNOSIS — N186 End stage renal disease: Secondary | ICD-10-CM | POA: Diagnosis not present

## 2023-04-13 DIAGNOSIS — D631 Anemia in chronic kidney disease: Secondary | ICD-10-CM | POA: Diagnosis not present

## 2023-04-13 DIAGNOSIS — D509 Iron deficiency anemia, unspecified: Secondary | ICD-10-CM | POA: Diagnosis not present

## 2023-04-13 DIAGNOSIS — N2581 Secondary hyperparathyroidism of renal origin: Secondary | ICD-10-CM | POA: Diagnosis not present

## 2023-04-15 DIAGNOSIS — Z992 Dependence on renal dialysis: Secondary | ICD-10-CM | POA: Diagnosis not present

## 2023-04-15 DIAGNOSIS — D509 Iron deficiency anemia, unspecified: Secondary | ICD-10-CM | POA: Diagnosis not present

## 2023-04-15 DIAGNOSIS — N2581 Secondary hyperparathyroidism of renal origin: Secondary | ICD-10-CM | POA: Diagnosis not present

## 2023-04-15 DIAGNOSIS — N186 End stage renal disease: Secondary | ICD-10-CM | POA: Diagnosis not present

## 2023-04-15 DIAGNOSIS — D631 Anemia in chronic kidney disease: Secondary | ICD-10-CM | POA: Diagnosis not present

## 2023-04-18 DIAGNOSIS — D631 Anemia in chronic kidney disease: Secondary | ICD-10-CM | POA: Diagnosis not present

## 2023-04-18 DIAGNOSIS — N2581 Secondary hyperparathyroidism of renal origin: Secondary | ICD-10-CM | POA: Diagnosis not present

## 2023-04-18 DIAGNOSIS — N186 End stage renal disease: Secondary | ICD-10-CM | POA: Diagnosis not present

## 2023-04-18 DIAGNOSIS — Z992 Dependence on renal dialysis: Secondary | ICD-10-CM | POA: Diagnosis not present

## 2023-04-18 DIAGNOSIS — D509 Iron deficiency anemia, unspecified: Secondary | ICD-10-CM | POA: Diagnosis not present

## 2023-04-19 DIAGNOSIS — E1151 Type 2 diabetes mellitus with diabetic peripheral angiopathy without gangrene: Secondary | ICD-10-CM | POA: Diagnosis not present

## 2023-04-19 DIAGNOSIS — L110 Acquired keratosis follicularis: Secondary | ICD-10-CM | POA: Diagnosis not present

## 2023-04-19 DIAGNOSIS — E1140 Type 2 diabetes mellitus with diabetic neuropathy, unspecified: Secondary | ICD-10-CM | POA: Diagnosis not present

## 2023-04-19 DIAGNOSIS — B351 Tinea unguium: Secondary | ICD-10-CM | POA: Diagnosis not present

## 2023-04-20 DIAGNOSIS — N2581 Secondary hyperparathyroidism of renal origin: Secondary | ICD-10-CM | POA: Diagnosis not present

## 2023-04-20 DIAGNOSIS — D509 Iron deficiency anemia, unspecified: Secondary | ICD-10-CM | POA: Diagnosis not present

## 2023-04-20 DIAGNOSIS — D631 Anemia in chronic kidney disease: Secondary | ICD-10-CM | POA: Diagnosis not present

## 2023-04-20 DIAGNOSIS — N186 End stage renal disease: Secondary | ICD-10-CM | POA: Diagnosis not present

## 2023-04-20 DIAGNOSIS — Z992 Dependence on renal dialysis: Secondary | ICD-10-CM | POA: Diagnosis not present

## 2023-04-22 DIAGNOSIS — D509 Iron deficiency anemia, unspecified: Secondary | ICD-10-CM | POA: Diagnosis not present

## 2023-04-22 DIAGNOSIS — N2581 Secondary hyperparathyroidism of renal origin: Secondary | ICD-10-CM | POA: Diagnosis not present

## 2023-04-22 DIAGNOSIS — D631 Anemia in chronic kidney disease: Secondary | ICD-10-CM | POA: Diagnosis not present

## 2023-04-22 DIAGNOSIS — Z992 Dependence on renal dialysis: Secondary | ICD-10-CM | POA: Diagnosis not present

## 2023-04-22 DIAGNOSIS — N186 End stage renal disease: Secondary | ICD-10-CM | POA: Diagnosis not present

## 2023-04-25 DIAGNOSIS — Z992 Dependence on renal dialysis: Secondary | ICD-10-CM | POA: Diagnosis not present

## 2023-04-25 DIAGNOSIS — N186 End stage renal disease: Secondary | ICD-10-CM | POA: Diagnosis not present

## 2023-04-25 DIAGNOSIS — D631 Anemia in chronic kidney disease: Secondary | ICD-10-CM | POA: Diagnosis not present

## 2023-04-25 DIAGNOSIS — N2581 Secondary hyperparathyroidism of renal origin: Secondary | ICD-10-CM | POA: Diagnosis not present

## 2023-04-25 DIAGNOSIS — D509 Iron deficiency anemia, unspecified: Secondary | ICD-10-CM | POA: Diagnosis not present

## 2023-04-27 DIAGNOSIS — D631 Anemia in chronic kidney disease: Secondary | ICD-10-CM | POA: Diagnosis not present

## 2023-04-27 DIAGNOSIS — N2581 Secondary hyperparathyroidism of renal origin: Secondary | ICD-10-CM | POA: Diagnosis not present

## 2023-04-27 DIAGNOSIS — N186 End stage renal disease: Secondary | ICD-10-CM | POA: Diagnosis not present

## 2023-04-27 DIAGNOSIS — Z992 Dependence on renal dialysis: Secondary | ICD-10-CM | POA: Diagnosis not present

## 2023-04-27 DIAGNOSIS — D509 Iron deficiency anemia, unspecified: Secondary | ICD-10-CM | POA: Diagnosis not present

## 2023-04-29 DIAGNOSIS — N2581 Secondary hyperparathyroidism of renal origin: Secondary | ICD-10-CM | POA: Diagnosis not present

## 2023-04-29 DIAGNOSIS — D509 Iron deficiency anemia, unspecified: Secondary | ICD-10-CM | POA: Diagnosis not present

## 2023-04-29 DIAGNOSIS — N186 End stage renal disease: Secondary | ICD-10-CM | POA: Diagnosis not present

## 2023-04-29 DIAGNOSIS — Z992 Dependence on renal dialysis: Secondary | ICD-10-CM | POA: Diagnosis not present

## 2023-04-29 DIAGNOSIS — D631 Anemia in chronic kidney disease: Secondary | ICD-10-CM | POA: Diagnosis not present

## 2023-05-02 DIAGNOSIS — D631 Anemia in chronic kidney disease: Secondary | ICD-10-CM | POA: Diagnosis not present

## 2023-05-02 DIAGNOSIS — Z992 Dependence on renal dialysis: Secondary | ICD-10-CM | POA: Diagnosis not present

## 2023-05-02 DIAGNOSIS — D509 Iron deficiency anemia, unspecified: Secondary | ICD-10-CM | POA: Diagnosis not present

## 2023-05-02 DIAGNOSIS — N2581 Secondary hyperparathyroidism of renal origin: Secondary | ICD-10-CM | POA: Diagnosis not present

## 2023-05-02 DIAGNOSIS — N186 End stage renal disease: Secondary | ICD-10-CM | POA: Diagnosis not present

## 2023-05-04 DIAGNOSIS — Z992 Dependence on renal dialysis: Secondary | ICD-10-CM | POA: Diagnosis not present

## 2023-05-04 DIAGNOSIS — N2581 Secondary hyperparathyroidism of renal origin: Secondary | ICD-10-CM | POA: Diagnosis not present

## 2023-05-04 DIAGNOSIS — N186 End stage renal disease: Secondary | ICD-10-CM | POA: Diagnosis not present

## 2023-05-04 DIAGNOSIS — D631 Anemia in chronic kidney disease: Secondary | ICD-10-CM | POA: Diagnosis not present

## 2023-05-04 DIAGNOSIS — D509 Iron deficiency anemia, unspecified: Secondary | ICD-10-CM | POA: Diagnosis not present

## 2023-05-06 DIAGNOSIS — D509 Iron deficiency anemia, unspecified: Secondary | ICD-10-CM | POA: Diagnosis not present

## 2023-05-06 DIAGNOSIS — N186 End stage renal disease: Secondary | ICD-10-CM | POA: Diagnosis not present

## 2023-05-06 DIAGNOSIS — D631 Anemia in chronic kidney disease: Secondary | ICD-10-CM | POA: Diagnosis not present

## 2023-05-06 DIAGNOSIS — N2581 Secondary hyperparathyroidism of renal origin: Secondary | ICD-10-CM | POA: Diagnosis not present

## 2023-05-06 DIAGNOSIS — Z992 Dependence on renal dialysis: Secondary | ICD-10-CM | POA: Diagnosis not present

## 2023-05-08 DIAGNOSIS — Z992 Dependence on renal dialysis: Secondary | ICD-10-CM | POA: Diagnosis not present

## 2023-05-08 DIAGNOSIS — N186 End stage renal disease: Secondary | ICD-10-CM | POA: Diagnosis not present

## 2023-05-09 DIAGNOSIS — N186 End stage renal disease: Secondary | ICD-10-CM | POA: Diagnosis not present

## 2023-05-09 DIAGNOSIS — D631 Anemia in chronic kidney disease: Secondary | ICD-10-CM | POA: Diagnosis not present

## 2023-05-09 DIAGNOSIS — N250 Renal osteodystrophy: Secondary | ICD-10-CM | POA: Diagnosis not present

## 2023-05-09 DIAGNOSIS — Z992 Dependence on renal dialysis: Secondary | ICD-10-CM | POA: Diagnosis not present

## 2023-05-09 DIAGNOSIS — N2581 Secondary hyperparathyroidism of renal origin: Secondary | ICD-10-CM | POA: Diagnosis not present

## 2023-05-09 DIAGNOSIS — D509 Iron deficiency anemia, unspecified: Secondary | ICD-10-CM | POA: Diagnosis not present

## 2023-05-11 DIAGNOSIS — N2581 Secondary hyperparathyroidism of renal origin: Secondary | ICD-10-CM | POA: Diagnosis not present

## 2023-05-11 DIAGNOSIS — Z992 Dependence on renal dialysis: Secondary | ICD-10-CM | POA: Diagnosis not present

## 2023-05-11 DIAGNOSIS — D509 Iron deficiency anemia, unspecified: Secondary | ICD-10-CM | POA: Diagnosis not present

## 2023-05-11 DIAGNOSIS — N250 Renal osteodystrophy: Secondary | ICD-10-CM | POA: Diagnosis not present

## 2023-05-11 DIAGNOSIS — D631 Anemia in chronic kidney disease: Secondary | ICD-10-CM | POA: Diagnosis not present

## 2023-05-11 DIAGNOSIS — N186 End stage renal disease: Secondary | ICD-10-CM | POA: Diagnosis not present

## 2023-05-13 DIAGNOSIS — N186 End stage renal disease: Secondary | ICD-10-CM | POA: Diagnosis not present

## 2023-05-13 DIAGNOSIS — N250 Renal osteodystrophy: Secondary | ICD-10-CM | POA: Diagnosis not present

## 2023-05-13 DIAGNOSIS — Z992 Dependence on renal dialysis: Secondary | ICD-10-CM | POA: Diagnosis not present

## 2023-05-13 DIAGNOSIS — N2581 Secondary hyperparathyroidism of renal origin: Secondary | ICD-10-CM | POA: Diagnosis not present

## 2023-05-13 DIAGNOSIS — D631 Anemia in chronic kidney disease: Secondary | ICD-10-CM | POA: Diagnosis not present

## 2023-05-13 DIAGNOSIS — D509 Iron deficiency anemia, unspecified: Secondary | ICD-10-CM | POA: Diagnosis not present

## 2023-05-16 DIAGNOSIS — N186 End stage renal disease: Secondary | ICD-10-CM | POA: Diagnosis not present

## 2023-05-16 DIAGNOSIS — D631 Anemia in chronic kidney disease: Secondary | ICD-10-CM | POA: Diagnosis not present

## 2023-05-16 DIAGNOSIS — N2581 Secondary hyperparathyroidism of renal origin: Secondary | ICD-10-CM | POA: Diagnosis not present

## 2023-05-16 DIAGNOSIS — D509 Iron deficiency anemia, unspecified: Secondary | ICD-10-CM | POA: Diagnosis not present

## 2023-05-16 DIAGNOSIS — N250 Renal osteodystrophy: Secondary | ICD-10-CM | POA: Diagnosis not present

## 2023-05-16 DIAGNOSIS — Z992 Dependence on renal dialysis: Secondary | ICD-10-CM | POA: Diagnosis not present

## 2023-05-18 DIAGNOSIS — Z992 Dependence on renal dialysis: Secondary | ICD-10-CM | POA: Diagnosis not present

## 2023-05-18 DIAGNOSIS — D509 Iron deficiency anemia, unspecified: Secondary | ICD-10-CM | POA: Diagnosis not present

## 2023-05-18 DIAGNOSIS — D631 Anemia in chronic kidney disease: Secondary | ICD-10-CM | POA: Diagnosis not present

## 2023-05-18 DIAGNOSIS — N2581 Secondary hyperparathyroidism of renal origin: Secondary | ICD-10-CM | POA: Diagnosis not present

## 2023-05-18 DIAGNOSIS — N186 End stage renal disease: Secondary | ICD-10-CM | POA: Diagnosis not present

## 2023-05-18 DIAGNOSIS — N250 Renal osteodystrophy: Secondary | ICD-10-CM | POA: Diagnosis not present

## 2023-05-20 DIAGNOSIS — D631 Anemia in chronic kidney disease: Secondary | ICD-10-CM | POA: Diagnosis not present

## 2023-05-20 DIAGNOSIS — N250 Renal osteodystrophy: Secondary | ICD-10-CM | POA: Diagnosis not present

## 2023-05-20 DIAGNOSIS — N186 End stage renal disease: Secondary | ICD-10-CM | POA: Diagnosis not present

## 2023-05-20 DIAGNOSIS — D509 Iron deficiency anemia, unspecified: Secondary | ICD-10-CM | POA: Diagnosis not present

## 2023-05-20 DIAGNOSIS — N2581 Secondary hyperparathyroidism of renal origin: Secondary | ICD-10-CM | POA: Diagnosis not present

## 2023-05-20 DIAGNOSIS — Z992 Dependence on renal dialysis: Secondary | ICD-10-CM | POA: Diagnosis not present

## 2023-05-23 DIAGNOSIS — D509 Iron deficiency anemia, unspecified: Secondary | ICD-10-CM | POA: Diagnosis not present

## 2023-05-23 DIAGNOSIS — D631 Anemia in chronic kidney disease: Secondary | ICD-10-CM | POA: Diagnosis not present

## 2023-05-23 DIAGNOSIS — N2581 Secondary hyperparathyroidism of renal origin: Secondary | ICD-10-CM | POA: Diagnosis not present

## 2023-05-23 DIAGNOSIS — N186 End stage renal disease: Secondary | ICD-10-CM | POA: Diagnosis not present

## 2023-05-23 DIAGNOSIS — Z992 Dependence on renal dialysis: Secondary | ICD-10-CM | POA: Diagnosis not present

## 2023-05-23 DIAGNOSIS — N250 Renal osteodystrophy: Secondary | ICD-10-CM | POA: Diagnosis not present

## 2023-05-25 DIAGNOSIS — D509 Iron deficiency anemia, unspecified: Secondary | ICD-10-CM | POA: Diagnosis not present

## 2023-05-25 DIAGNOSIS — N250 Renal osteodystrophy: Secondary | ICD-10-CM | POA: Diagnosis not present

## 2023-05-25 DIAGNOSIS — Z992 Dependence on renal dialysis: Secondary | ICD-10-CM | POA: Diagnosis not present

## 2023-05-25 DIAGNOSIS — D631 Anemia in chronic kidney disease: Secondary | ICD-10-CM | POA: Diagnosis not present

## 2023-05-25 DIAGNOSIS — N186 End stage renal disease: Secondary | ICD-10-CM | POA: Diagnosis not present

## 2023-05-25 DIAGNOSIS — N2581 Secondary hyperparathyroidism of renal origin: Secondary | ICD-10-CM | POA: Diagnosis not present

## 2023-05-27 DIAGNOSIS — N250 Renal osteodystrophy: Secondary | ICD-10-CM | POA: Diagnosis not present

## 2023-05-27 DIAGNOSIS — D509 Iron deficiency anemia, unspecified: Secondary | ICD-10-CM | POA: Diagnosis not present

## 2023-05-27 DIAGNOSIS — Z992 Dependence on renal dialysis: Secondary | ICD-10-CM | POA: Diagnosis not present

## 2023-05-27 DIAGNOSIS — D631 Anemia in chronic kidney disease: Secondary | ICD-10-CM | POA: Diagnosis not present

## 2023-05-27 DIAGNOSIS — N2581 Secondary hyperparathyroidism of renal origin: Secondary | ICD-10-CM | POA: Diagnosis not present

## 2023-05-27 DIAGNOSIS — N186 End stage renal disease: Secondary | ICD-10-CM | POA: Diagnosis not present

## 2023-05-30 DIAGNOSIS — N186 End stage renal disease: Secondary | ICD-10-CM | POA: Diagnosis not present

## 2023-05-30 DIAGNOSIS — N2581 Secondary hyperparathyroidism of renal origin: Secondary | ICD-10-CM | POA: Diagnosis not present

## 2023-05-30 DIAGNOSIS — D631 Anemia in chronic kidney disease: Secondary | ICD-10-CM | POA: Diagnosis not present

## 2023-05-30 DIAGNOSIS — N250 Renal osteodystrophy: Secondary | ICD-10-CM | POA: Diagnosis not present

## 2023-05-30 DIAGNOSIS — Z992 Dependence on renal dialysis: Secondary | ICD-10-CM | POA: Diagnosis not present

## 2023-05-30 DIAGNOSIS — D509 Iron deficiency anemia, unspecified: Secondary | ICD-10-CM | POA: Diagnosis not present

## 2023-06-01 DIAGNOSIS — N2581 Secondary hyperparathyroidism of renal origin: Secondary | ICD-10-CM | POA: Diagnosis not present

## 2023-06-01 DIAGNOSIS — N186 End stage renal disease: Secondary | ICD-10-CM | POA: Diagnosis not present

## 2023-06-01 DIAGNOSIS — N250 Renal osteodystrophy: Secondary | ICD-10-CM | POA: Diagnosis not present

## 2023-06-01 DIAGNOSIS — D509 Iron deficiency anemia, unspecified: Secondary | ICD-10-CM | POA: Diagnosis not present

## 2023-06-01 DIAGNOSIS — D631 Anemia in chronic kidney disease: Secondary | ICD-10-CM | POA: Diagnosis not present

## 2023-06-01 DIAGNOSIS — Z992 Dependence on renal dialysis: Secondary | ICD-10-CM | POA: Diagnosis not present

## 2023-06-03 DIAGNOSIS — N186 End stage renal disease: Secondary | ICD-10-CM | POA: Diagnosis not present

## 2023-06-03 DIAGNOSIS — J441 Chronic obstructive pulmonary disease with (acute) exacerbation: Secondary | ICD-10-CM | POA: Diagnosis not present

## 2023-06-03 DIAGNOSIS — J439 Emphysema, unspecified: Secondary | ICD-10-CM | POA: Diagnosis not present

## 2023-06-03 DIAGNOSIS — D509 Iron deficiency anemia, unspecified: Secondary | ICD-10-CM | POA: Diagnosis not present

## 2023-06-03 DIAGNOSIS — R911 Solitary pulmonary nodule: Secondary | ICD-10-CM | POA: Diagnosis not present

## 2023-06-03 DIAGNOSIS — Z299 Encounter for prophylactic measures, unspecified: Secondary | ICD-10-CM | POA: Diagnosis not present

## 2023-06-03 DIAGNOSIS — N250 Renal osteodystrophy: Secondary | ICD-10-CM | POA: Diagnosis not present

## 2023-06-03 DIAGNOSIS — I10 Essential (primary) hypertension: Secondary | ICD-10-CM | POA: Diagnosis not present

## 2023-06-03 DIAGNOSIS — Z992 Dependence on renal dialysis: Secondary | ICD-10-CM | POA: Diagnosis not present

## 2023-06-03 DIAGNOSIS — J3489 Other specified disorders of nose and nasal sinuses: Secondary | ICD-10-CM | POA: Diagnosis not present

## 2023-06-03 DIAGNOSIS — N2581 Secondary hyperparathyroidism of renal origin: Secondary | ICD-10-CM | POA: Diagnosis not present

## 2023-06-03 DIAGNOSIS — D631 Anemia in chronic kidney disease: Secondary | ICD-10-CM | POA: Diagnosis not present

## 2023-06-06 DIAGNOSIS — N186 End stage renal disease: Secondary | ICD-10-CM | POA: Diagnosis not present

## 2023-06-06 DIAGNOSIS — Z992 Dependence on renal dialysis: Secondary | ICD-10-CM | POA: Diagnosis not present

## 2023-06-06 DIAGNOSIS — N2581 Secondary hyperparathyroidism of renal origin: Secondary | ICD-10-CM | POA: Diagnosis not present

## 2023-06-06 DIAGNOSIS — N250 Renal osteodystrophy: Secondary | ICD-10-CM | POA: Diagnosis not present

## 2023-06-06 DIAGNOSIS — D509 Iron deficiency anemia, unspecified: Secondary | ICD-10-CM | POA: Diagnosis not present

## 2023-06-06 DIAGNOSIS — D631 Anemia in chronic kidney disease: Secondary | ICD-10-CM | POA: Diagnosis not present

## 2023-06-08 DIAGNOSIS — N2581 Secondary hyperparathyroidism of renal origin: Secondary | ICD-10-CM | POA: Diagnosis not present

## 2023-06-08 DIAGNOSIS — D509 Iron deficiency anemia, unspecified: Secondary | ICD-10-CM | POA: Diagnosis not present

## 2023-06-08 DIAGNOSIS — N186 End stage renal disease: Secondary | ICD-10-CM | POA: Diagnosis not present

## 2023-06-08 DIAGNOSIS — N250 Renal osteodystrophy: Secondary | ICD-10-CM | POA: Diagnosis not present

## 2023-06-08 DIAGNOSIS — Z992 Dependence on renal dialysis: Secondary | ICD-10-CM | POA: Diagnosis not present

## 2023-06-08 DIAGNOSIS — D631 Anemia in chronic kidney disease: Secondary | ICD-10-CM | POA: Diagnosis not present

## 2023-06-09 DIAGNOSIS — D631 Anemia in chronic kidney disease: Secondary | ICD-10-CM | POA: Diagnosis not present

## 2023-06-09 DIAGNOSIS — Z992 Dependence on renal dialysis: Secondary | ICD-10-CM | POA: Diagnosis not present

## 2023-06-09 DIAGNOSIS — N2581 Secondary hyperparathyroidism of renal origin: Secondary | ICD-10-CM | POA: Diagnosis not present

## 2023-06-09 DIAGNOSIS — N250 Renal osteodystrophy: Secondary | ICD-10-CM | POA: Diagnosis not present

## 2023-06-09 DIAGNOSIS — D509 Iron deficiency anemia, unspecified: Secondary | ICD-10-CM | POA: Diagnosis not present

## 2023-06-09 DIAGNOSIS — N186 End stage renal disease: Secondary | ICD-10-CM | POA: Diagnosis not present

## 2023-06-10 DIAGNOSIS — D509 Iron deficiency anemia, unspecified: Secondary | ICD-10-CM | POA: Diagnosis not present

## 2023-06-10 DIAGNOSIS — N2581 Secondary hyperparathyroidism of renal origin: Secondary | ICD-10-CM | POA: Diagnosis not present

## 2023-06-10 DIAGNOSIS — D631 Anemia in chronic kidney disease: Secondary | ICD-10-CM | POA: Diagnosis not present

## 2023-06-10 DIAGNOSIS — N186 End stage renal disease: Secondary | ICD-10-CM | POA: Diagnosis not present

## 2023-06-10 DIAGNOSIS — Z992 Dependence on renal dialysis: Secondary | ICD-10-CM | POA: Diagnosis not present

## 2023-06-10 DIAGNOSIS — N250 Renal osteodystrophy: Secondary | ICD-10-CM | POA: Diagnosis not present

## 2023-06-13 DIAGNOSIS — D631 Anemia in chronic kidney disease: Secondary | ICD-10-CM | POA: Diagnosis not present

## 2023-06-13 DIAGNOSIS — D509 Iron deficiency anemia, unspecified: Secondary | ICD-10-CM | POA: Diagnosis not present

## 2023-06-13 DIAGNOSIS — N2581 Secondary hyperparathyroidism of renal origin: Secondary | ICD-10-CM | POA: Diagnosis not present

## 2023-06-13 DIAGNOSIS — Z992 Dependence on renal dialysis: Secondary | ICD-10-CM | POA: Diagnosis not present

## 2023-06-13 DIAGNOSIS — N250 Renal osteodystrophy: Secondary | ICD-10-CM | POA: Diagnosis not present

## 2023-06-13 DIAGNOSIS — N186 End stage renal disease: Secondary | ICD-10-CM | POA: Diagnosis not present

## 2023-06-15 DIAGNOSIS — D509 Iron deficiency anemia, unspecified: Secondary | ICD-10-CM | POA: Diagnosis not present

## 2023-06-15 DIAGNOSIS — N186 End stage renal disease: Secondary | ICD-10-CM | POA: Diagnosis not present

## 2023-06-15 DIAGNOSIS — N250 Renal osteodystrophy: Secondary | ICD-10-CM | POA: Diagnosis not present

## 2023-06-15 DIAGNOSIS — D631 Anemia in chronic kidney disease: Secondary | ICD-10-CM | POA: Diagnosis not present

## 2023-06-15 DIAGNOSIS — Z992 Dependence on renal dialysis: Secondary | ICD-10-CM | POA: Diagnosis not present

## 2023-06-15 DIAGNOSIS — N2581 Secondary hyperparathyroidism of renal origin: Secondary | ICD-10-CM | POA: Diagnosis not present

## 2023-06-17 DIAGNOSIS — D509 Iron deficiency anemia, unspecified: Secondary | ICD-10-CM | POA: Diagnosis not present

## 2023-06-17 DIAGNOSIS — D631 Anemia in chronic kidney disease: Secondary | ICD-10-CM | POA: Diagnosis not present

## 2023-06-17 DIAGNOSIS — N186 End stage renal disease: Secondary | ICD-10-CM | POA: Diagnosis not present

## 2023-06-17 DIAGNOSIS — N2581 Secondary hyperparathyroidism of renal origin: Secondary | ICD-10-CM | POA: Diagnosis not present

## 2023-06-17 DIAGNOSIS — N250 Renal osteodystrophy: Secondary | ICD-10-CM | POA: Diagnosis not present

## 2023-06-17 DIAGNOSIS — Z992 Dependence on renal dialysis: Secondary | ICD-10-CM | POA: Diagnosis not present

## 2023-06-20 DIAGNOSIS — Z992 Dependence on renal dialysis: Secondary | ICD-10-CM | POA: Diagnosis not present

## 2023-06-20 DIAGNOSIS — N2581 Secondary hyperparathyroidism of renal origin: Secondary | ICD-10-CM | POA: Diagnosis not present

## 2023-06-20 DIAGNOSIS — N250 Renal osteodystrophy: Secondary | ICD-10-CM | POA: Diagnosis not present

## 2023-06-20 DIAGNOSIS — D631 Anemia in chronic kidney disease: Secondary | ICD-10-CM | POA: Diagnosis not present

## 2023-06-20 DIAGNOSIS — D509 Iron deficiency anemia, unspecified: Secondary | ICD-10-CM | POA: Diagnosis not present

## 2023-06-20 DIAGNOSIS — N186 End stage renal disease: Secondary | ICD-10-CM | POA: Diagnosis not present

## 2023-06-20 DIAGNOSIS — I700 Atherosclerosis of aorta: Secondary | ICD-10-CM | POA: Diagnosis not present

## 2023-06-20 DIAGNOSIS — R918 Other nonspecific abnormal finding of lung field: Secondary | ICD-10-CM | POA: Diagnosis not present

## 2023-06-21 DIAGNOSIS — Z1211 Encounter for screening for malignant neoplasm of colon: Secondary | ICD-10-CM | POA: Diagnosis not present

## 2023-06-21 DIAGNOSIS — I10 Essential (primary) hypertension: Secondary | ICD-10-CM | POA: Diagnosis not present

## 2023-06-21 DIAGNOSIS — Z299 Encounter for prophylactic measures, unspecified: Secondary | ICD-10-CM | POA: Diagnosis not present

## 2023-06-21 DIAGNOSIS — G2581 Restless legs syndrome: Secondary | ICD-10-CM | POA: Diagnosis not present

## 2023-06-21 DIAGNOSIS — Z992 Dependence on renal dialysis: Secondary | ICD-10-CM | POA: Diagnosis not present

## 2023-06-22 DIAGNOSIS — Z992 Dependence on renal dialysis: Secondary | ICD-10-CM | POA: Diagnosis not present

## 2023-06-22 DIAGNOSIS — N186 End stage renal disease: Secondary | ICD-10-CM | POA: Diagnosis not present

## 2023-06-22 DIAGNOSIS — N250 Renal osteodystrophy: Secondary | ICD-10-CM | POA: Diagnosis not present

## 2023-06-22 DIAGNOSIS — D631 Anemia in chronic kidney disease: Secondary | ICD-10-CM | POA: Diagnosis not present

## 2023-06-22 DIAGNOSIS — D509 Iron deficiency anemia, unspecified: Secondary | ICD-10-CM | POA: Diagnosis not present

## 2023-06-22 DIAGNOSIS — N2581 Secondary hyperparathyroidism of renal origin: Secondary | ICD-10-CM | POA: Diagnosis not present

## 2023-06-23 ENCOUNTER — Other Ambulatory Visit: Payer: Medicare Other

## 2023-06-23 DIAGNOSIS — Z8546 Personal history of malignant neoplasm of prostate: Secondary | ICD-10-CM | POA: Diagnosis not present

## 2023-06-23 DIAGNOSIS — R9721 Rising PSA following treatment for malignant neoplasm of prostate: Secondary | ICD-10-CM | POA: Diagnosis not present

## 2023-06-24 ENCOUNTER — Other Ambulatory Visit: Payer: Medicare Other

## 2023-06-24 DIAGNOSIS — N186 End stage renal disease: Secondary | ICD-10-CM | POA: Diagnosis not present

## 2023-06-24 DIAGNOSIS — Z992 Dependence on renal dialysis: Secondary | ICD-10-CM | POA: Diagnosis not present

## 2023-06-24 DIAGNOSIS — D631 Anemia in chronic kidney disease: Secondary | ICD-10-CM | POA: Diagnosis not present

## 2023-06-24 DIAGNOSIS — N2581 Secondary hyperparathyroidism of renal origin: Secondary | ICD-10-CM | POA: Diagnosis not present

## 2023-06-24 DIAGNOSIS — D509 Iron deficiency anemia, unspecified: Secondary | ICD-10-CM | POA: Diagnosis not present

## 2023-06-24 DIAGNOSIS — N250 Renal osteodystrophy: Secondary | ICD-10-CM | POA: Diagnosis not present

## 2023-06-24 LAB — PSA: Prostate Specific Ag, Serum: 0.9 ng/mL (ref 0.0–4.0)

## 2023-06-27 DIAGNOSIS — N2581 Secondary hyperparathyroidism of renal origin: Secondary | ICD-10-CM | POA: Diagnosis not present

## 2023-06-27 DIAGNOSIS — D509 Iron deficiency anemia, unspecified: Secondary | ICD-10-CM | POA: Diagnosis not present

## 2023-06-27 DIAGNOSIS — N186 End stage renal disease: Secondary | ICD-10-CM | POA: Diagnosis not present

## 2023-06-27 DIAGNOSIS — D631 Anemia in chronic kidney disease: Secondary | ICD-10-CM | POA: Diagnosis not present

## 2023-06-27 DIAGNOSIS — N250 Renal osteodystrophy: Secondary | ICD-10-CM | POA: Diagnosis not present

## 2023-06-27 DIAGNOSIS — Z992 Dependence on renal dialysis: Secondary | ICD-10-CM | POA: Diagnosis not present

## 2023-06-28 DIAGNOSIS — L110 Acquired keratosis follicularis: Secondary | ICD-10-CM | POA: Diagnosis not present

## 2023-06-28 DIAGNOSIS — B351 Tinea unguium: Secondary | ICD-10-CM | POA: Diagnosis not present

## 2023-06-28 DIAGNOSIS — E1151 Type 2 diabetes mellitus with diabetic peripheral angiopathy without gangrene: Secondary | ICD-10-CM | POA: Diagnosis not present

## 2023-06-28 DIAGNOSIS — E1140 Type 2 diabetes mellitus with diabetic neuropathy, unspecified: Secondary | ICD-10-CM | POA: Diagnosis not present

## 2023-06-29 ENCOUNTER — Other Ambulatory Visit: Payer: Self-pay | Admitting: Cardiology

## 2023-06-29 DIAGNOSIS — N2581 Secondary hyperparathyroidism of renal origin: Secondary | ICD-10-CM | POA: Diagnosis not present

## 2023-06-29 DIAGNOSIS — N186 End stage renal disease: Secondary | ICD-10-CM | POA: Diagnosis not present

## 2023-06-29 DIAGNOSIS — D509 Iron deficiency anemia, unspecified: Secondary | ICD-10-CM | POA: Diagnosis not present

## 2023-06-29 DIAGNOSIS — Z992 Dependence on renal dialysis: Secondary | ICD-10-CM | POA: Diagnosis not present

## 2023-06-29 DIAGNOSIS — D631 Anemia in chronic kidney disease: Secondary | ICD-10-CM | POA: Diagnosis not present

## 2023-06-29 DIAGNOSIS — N250 Renal osteodystrophy: Secondary | ICD-10-CM | POA: Diagnosis not present

## 2023-06-30 ENCOUNTER — Ambulatory Visit (INDEPENDENT_AMBULATORY_CARE_PROVIDER_SITE_OTHER): Payer: Medicare Other | Admitting: Urology

## 2023-06-30 ENCOUNTER — Ambulatory Visit: Payer: Medicare Other | Admitting: Urology

## 2023-06-30 VITALS — BP 130/71 | HR 98

## 2023-06-30 DIAGNOSIS — R300 Dysuria: Secondary | ICD-10-CM | POA: Diagnosis not present

## 2023-06-30 DIAGNOSIS — Z8546 Personal history of malignant neoplasm of prostate: Secondary | ICD-10-CM

## 2023-06-30 DIAGNOSIS — R339 Retention of urine, unspecified: Secondary | ICD-10-CM

## 2023-06-30 DIAGNOSIS — R9721 Rising PSA following treatment for malignant neoplasm of prostate: Secondary | ICD-10-CM

## 2023-06-30 DIAGNOSIS — N401 Enlarged prostate with lower urinary tract symptoms: Secondary | ICD-10-CM

## 2023-06-30 DIAGNOSIS — Z87440 Personal history of urinary (tract) infections: Secondary | ICD-10-CM

## 2023-06-30 DIAGNOSIS — R3912 Poor urinary stream: Secondary | ICD-10-CM

## 2023-06-30 DIAGNOSIS — Z85528 Personal history of other malignant neoplasm of kidney: Secondary | ICD-10-CM | POA: Diagnosis not present

## 2023-06-30 NOTE — Progress Notes (Signed)
Subjective:  1. History of prostate cancer   2. Rising PSA following treatment for malignant neoplasm of prostate   3. BPH with urinary obstruction   4. Weak urinary stream   5. Dysuria   6. History of kidney cancer   7. History of urinary infection    06/30/23: Todd Robertson returns today in f/u for his history below. His PSA is stable at 0.9 on 06/23/23.  His IPSS is 16 with nocturia x 1 and frequency with some obstructive symptoms. He has occasional initial dysuria but no hematuria.  He remains on tamsulosin.  He has chronic constipation.  He has no weight loss or bone pain.    12/30/22: Todd Robertson returns today in f/u for the history of prostate cancer with prior EXRT.  He didn't get a PSA prior to this visit. The last was up slightly to 0.9.  He remains on dialysis and is still voiding a fair amount on non-diaylsis days.  His IPSS is 14.  He has no nocturia.  He has no bone pain or weight loss.  He has no GI complaints.  A CXR done for f/u of his history of a right RCCa with prior nephrectomy in 8/23 and it showed no mets.   He has had no UTI symptoms.  He remains on tamsulosin for LUTS.  UA has 6-10 WBC but no bacteria.   06/17/22: Todd Robertson returns today in f/u for his history of prostate cancer treated with EXRT 8-9 years ago.  His PSA has been rising but slowly with an increased from 0.8 to 0.9 over the last 6 months. 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  He continues to make moderate urine.  His iPSS is 14 with a reduced stream and urgency.  He remains on tamsulosin. The urine is clear today.  He has no weight loss or bone pain.  He didn't get the CXR that was ordered to be done in April for f/u of the renal cell CA.  UA is unremarkable.   12/17/21: Todd Robertson returns today in f/u.   He last saw Dr. Ronne Binning on 08/26/21 for f/u of his history of a right nephrectomy for RCCa in 7/21 and imaging was unremarkable.  He is to have a CXR in a couple of months.  He has a history of prostate cancer treated with EXRT  and his PSA is 0.8 which is a slight increase from 0.6 in 5/22.  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  He has a history of UTI's.  He last got Cipro in 10/22.  He had cystoscopy on 04/09/21.   HIs UA today is unremarkable.  He has some burning at times.  His IPSS is 17 with frequency and nocturia x 2.    06/18/21:  Todd Robertson returns today in f/u.  He is doing well with an IPSS of 3.  He has had no symptoms of a recurrent infection.   He didn't get a UA today.   He remains on tamsulosin but is off of of the Levaquin prescribed for post dialysis use on 04/15/21.    He had a right nephrectomy for RCCa in 7/21 and is to have an [place] and CXR next month.   04/09/21: Todd Robertson returns today in f/u for cystoscopy to evaluate his persistent voiding symptoms and cystitis.   His culture on 04/02/21 was negative.   His PSA is 0.6 which is down from the prior levels.  He was given gent 160mg  at his last visit  and had some transient symptom improvement.     GU Hx: Todd Robertson returns today in f/u for his history of cystitis.  He has dysuria and difficulty voiding.  He has some urgency and frequency.  He has had no hematuria.   He has been treated with bactrim after a round of ceftin.  He has been off of the anitbiotics for about 2 weeks.  He has had no fever.  He had Klebsiella on 03/10/21 and it was sensitive to the bactrim which he was last given.   He has variable urine output.   He has increased frequency in the afternoon.  He will generally go 4-5x daily.   He has a history of prostate cancer treated with radiation therapy 7-8 years ago.   He had a right radical nephrectomy in 7/21.   The urinary symptoms began after the procedure.   The last PSA's I find were 4.1 in 1/17 and 1.4 in 8/17.     IPSS     Row Name 06/30/23 1400         International Prostate Symptom Score   How often have you had the sensation of not emptying your bladder? Less than 1 in 5     How often have you had to urinate less than every two hours?  More than half the time     How often have you found you stopped and started again several times when you urinated? About half the time     How often have you found it difficult to postpone urination? Not at All     How often have you had a weak urinary stream? About half the time     How often have you had to strain to start urination? More than half the time     How many times did you typically get up at night to urinate? 1 Time     Total IPSS Score 16       Quality of Life due to urinary symptoms   If you were to spend the rest of your life with your urinary condition just the way it is now how would you feel about that? Mostly Disatisfied                ROS:  ROS:  A complete review of systems was performed.  All systems are negative except for pertinent findings as noted.   Review of Systems  Respiratory:  Positive for cough.   Gastrointestinal:  Positive for constipation.  Musculoskeletal:  Positive for joint pain.  Skin:  Positive for itching.  Neurological:  Positive for weakness (on dialysis days).    No Known Allergies   Outpatient Encounter Medications as of 06/30/2023  Medication Sig   acetaminophen (TYLENOL) 500 MG tablet Take 500-1,000 mg by mouth every 6 (six) hours as needed for moderate pain.   albuterol (VENTOLIN HFA) 108 (90 Base) MCG/ACT inhaler Inhale 1-2 puffs into the lungs every 6 (six) hours as needed for wheezing or shortness of breath. (Patient taking differently: Inhale 1-2 puffs into the lungs 2 (two) times daily.)   allopurinol (ZYLOPRIM) 300 MG tablet Take 300 mg by mouth daily.   atorvastatin (LIPITOR) 40 MG tablet Take 1 tablet (40 mg total) by mouth daily. (Patient taking differently: Take 40 mg by mouth at bedtime.)   BREO ELLIPTA 100-25 MCG/ACT AEPB Inhale 1 puff into the lungs daily.   calcium acetate (PHOSLO) 667 MG capsule Take 3 capsules (2,001 mg total) by  mouth 3 (three) times daily with meals.   cetirizine (ZYRTEC) 10 MG tablet Take  10 mg by mouth daily.   Cholecalciferol (VITAMIN D) 50 MCG (2000 UT) tablet Take 1 tablet (2,000 Units total) by mouth daily.   methocarbamol (ROBAXIN) 500 MG tablet Take 500 mg by mouth 3 (three) times a week. Take Sun, Tue, and Thurs (day before dialysis)   metoprolol tartrate (LOPRESSOR) 25 MG tablet Take 0.5 tablets (12.5 mg total) by mouth 2 (two) times daily. (Patient taking differently: Take 12.5 mg by mouth 2 (two) times daily. after dialysis)   midodrine (PROAMATINE) 10 MG tablet TAKE 1 TABLET BY MOUTH THREE TIMES DAILY WITH MEALS   Multiple Vitamins-Minerals (CENTRUM SILVER 50+MEN) TABS Take 1 tablet by mouth daily.   Naphazoline HCl (CLEAR EYES OP) Place 1 drop into both eyes daily.   Polyethylene Glycol 3350 (MIRALAX PO) Take 17 g by mouth daily as needed (constipation).   senna (SENOKOT) 8.6 MG tablet Take 1-2 tablets by mouth daily as needed for constipation.   tamsulosin (FLOMAX) 0.4 MG CAPS capsule Take 1 capsule (0.4 mg total) by mouth daily.   trolamine salicylate (ASPERCREME) 10 % cream Apply 1 Application topically as needed for muscle pain.   No facility-administered encounter medications on file as of 06/30/2023.    Past Medical History:  Diagnosis Date   Arthritis    Cancer (HCC)    prostate   Cardiomyopathy    secondary   CKD (chronic kidney disease)    DM2 (diabetes mellitus, type 2) (HCC)    Gout    HTN (hypertension)    unspec   Hypercholesterolemia    Nonischemic cardiomyopathy (HCC)    a. EF 40-50% by most recent 2-D echo b. EF 25% by cardiac cath in 2004   Overweight(278.02)     Past Surgical History:  Procedure Laterality Date   AV FISTULA PLACEMENT Left 06/06/2020   Procedure: LEFT ARM ARTERIOVENOUS (AV) GRAFT USING 45CM GORTEX;  Surgeon: Larina Earthly, MD;  Location: [facility];  Service: Vascular;  Laterality: Left;   AV FISTULA PLACEMENT Left 06/08/2022   Procedure: INSERTION OF ARTERIOVENOUS GORE-TEX GRAFT UPPER ARM;  Surgeon: Larina Earthly, MD;   Location: [facility];  Service: Vascular;  Laterality: Left;   AVGG REMOVAL Left 07/27/2022   Procedure: EXCISION OF INFECTED PORTION OF LEFT FOREARM ARTERIOVENOUS GORETEX GRAFT (AVGG);  Surgeon: Larina Earthly, MD;  Location: [facility];  Service: Vascular;  Laterality: Left;   COLONOSCOPY     HERNIA REPAIR     INSERTION OF DIALYSIS CATHETER Right 06/06/2020   Procedure: INSERTION OF DIALYSIS CATHETER USING 23CM DOUBLE LUMEN CATHETER;  Surgeon: Larina Earthly, MD;  Location: [facility];  Service: Vascular;  Laterality: Right;   IR ANGIOGRAM EXTREMITY LEFT  10/25/2022   IR FLUORO GUIDE CV LINE RIGHT  05/20/2020   IR THROMBECTOMY AV FISTULA W/THROMBOLYSIS/PTA INC/SHUNT/IMG LEFT Left 10/25/2022   IR US GUIDE VASC ACCESS LEFT  10/25/2022   IR US GUIDE VASC ACCESS RIGHT  05/20/2020   KNEE ARTHROPLASTY Left 08/08/2017   Procedure: LEFT TOTAL KNEE ARTHROPLASTY WITH COMPUTER NAVIGATION;  Surgeon: Samson Frederic, MD;  Location: [facility];  Service: Orthopedics;  Laterality: Left;  Needs RNFA   KNEE ARTHROPLASTY Right 11/17/2017   Procedure: RIGHT TOTAL KNEE ARTHROPLASTY WITH COMPUTER NAVIGATION;  Surgeon: Samson Frederic, MD;  Location: [facility];  Service: Orthopedics;  Laterality: Right;  NEEDS RNFA   REMOVAL OF A DIALYSIS CATHETER Right 09/07/2022   Procedure:  MINOR REMOVAL OF A TUNNELED DIALYSIS CATHETER;  Surgeon: Larina Earthly, MD;  Location: [facility];  Service: Vascular;  Laterality: Right;   ROBOT ASSISTED LAPAROSCOPIC NEPHRECTOMY Right 05/16/2020   Procedure: XI ROBOTIC ASSISTED LAPAROSCOPIC NEPHRECTOMY;  Surgeon: Malen Gauze, MD;  Location: [facility];  Service: Urology;  Laterality: Right;  2.5 hrs    Social History   Socioeconomic History   Marital status: Married    Spouse name: Not on file   Number of children: 2   Years of education: Not on file   Highest education level: Not on file  Occupational History   Occupation: retired  Tobacco Use   Smoking status: Former    Current packs/day: 0.00    Average  packs/day: 0.5 packs/day for 20.0 years (10.0 ttl pk-yrs)    Types: Cigarettes    Start date: 02/12/1966    Quit date: 11/08/1984    Years since quitting: 38.6   Smokeless tobacco: Never  Vaping Use   Vaping status: Never Used  Substance and Sexual Activity   Alcohol use: No    Alcohol/week: 0.0 standard drinks of alcohol   Drug use: No   Sexual activity: Yes    Birth control/protection: None  Other Topics Concern   Not on file  Social History Narrative   Full time.    Social Determinants of Health   Financial Resource Strain: Not on file  Food Insecurity: No Food Insecurity (12/04/2020)   Received from [facility], [facility]   Hunger Vital Sign    Worried About Running Out of Food in the Last Year: Never true    Ran Out of Food in the Last Year: Never true  Transportation Needs: Not on file  Physical Activity: Not on file  Stress: No Stress Concern Present (01/27/2021)   Received from [facility], [facility] - Occupational Stress Questionnaire    Feeling of Stress : Only a little  Social Connections: Unknown (03/23/2022)   Received from [facility], [facility]   Social Network    Social Network: Not on file  Intimate Partner Violence: Unknown (02/12/2022)   Received from [facility], [facility]   HITS    Physically Hurt: Not on file    Insult or Talk Down To: Not on file    Threaten Physical Harm: Not on file    Scream or Curse: Not on file    Family History  Problem Relation Age of Onset   Hypertension Mother    Kidney disease Mother    Heart disease Father    Kidney disease Other        Objective: Vitals:   06/30/23 1352  BP: 130/71  Pulse: 98     Physical Exam Vitals reviewed.  Constitutional:      Appearance: Normal appearance.  Lymphadenopathy:     Cervical: No cervical adenopathy.     Upper Body:     Right upper body: No supraclavicular adenopathy.     Left upper body: No  supraclavicular adenopathy.  Neurological:     Mental Status: He is alert.     Lab Results:  PSA No results found for: "PSA" No results found for: "TESTOSTERONE"  No results found for this or any previous visit (from the past 24 hour(s)).     Lab Results  Component Value Date   PSA1 0.9 06/23/2023   PSA1 0.9 06/10/2022   PSA1 0.8 12/10/2021     Studies/Results:  CLINICAL  DATA:  History of kidney cancer.   EXAM: CHEST - 2 VIEW   COMPARISON:  June 06, 2020   FINDINGS: The heart size and mediastinal contours are stable. Right central venous line is identified unchanged. Mild linear opacities are identified in bilateral lung bases favor atelectasis or scar. No pulmonary edema or pleural effusion is noted. No definite focal pulmonary mass noted. Lungs are hyperinflated. The visualized skeletal structures are stable.   IMPRESSION: Mild linear opacities identified in bilateral lung bases favor atelectasis or scar.     Electronically Signed   By: Sherian Rein M.D.   On: 06/25/2022 10:50   Chest CT on 06/20/23 at [facility] showed no mets.    Assessment & Plan: LUTS with a history of UTI.  He has chronic initial dysuria but couldn't get  UA today.     Hx of prostate cancer with prior radiation therapy.  PSA is stable over the last 6 months.  Repeat in 6 months.   BPH with incomplete bladder emptying.  He is doing well on tamsulosin.  Hx of right RCCa with prior nephrectomy.  Chest CT OK. CXR in 6 months.   No orders of the defined types were placed in this encounter.     Orders Placed This Encounter  Procedures   DG Chest 2 View    Standing Status:   Future    Standing Expiration Date:   06/29/2024    Order Specific Question:   Reason for Exam (SYMPTOM  OR DIAGNOSIS REQUIRED)    Answer:   history of renal cell cancer    Order Specific Question:   Preferred imaging location?    Answer:   [facility]    Order Specific Question:   Radiology Contrast  Protocol - do NOT remove file path    Answer:   [email or web address]   US Renal Limited    Left side    Standing Status:   Future    Standing Expiration Date:   06/29/2024    Order Specific Question:   Reason for Exam (SYMPTOM  OR DIAGNOSIS REQUIRED)    Answer:   History of right nephrectomy for cancer.    Order Specific Question:   Preferred imaging location?    Answer:   [facility]   Urinalysis, Routine w reflex microscopic   PSA    Standing Status:   Future    Standing Expiration Date:   06/29/2024      Return in about 6 months (around 12/31/2023) for with CXR and PSA.Marland Kitchen   CC: Kirstie Peri, MD      Bjorn Pippin 07/04/2023 Patient ID: Todd Robertson, male   DOB: 20-Feb-1946, 77 y.o.   MRN: [id number]

## 2023-07-01 DIAGNOSIS — N2581 Secondary hyperparathyroidism of renal origin: Secondary | ICD-10-CM | POA: Diagnosis not present

## 2023-07-01 DIAGNOSIS — Z992 Dependence on renal dialysis: Secondary | ICD-10-CM | POA: Diagnosis not present

## 2023-07-01 DIAGNOSIS — D509 Iron deficiency anemia, unspecified: Secondary | ICD-10-CM | POA: Diagnosis not present

## 2023-07-01 DIAGNOSIS — D631 Anemia in chronic kidney disease: Secondary | ICD-10-CM | POA: Diagnosis not present

## 2023-07-01 DIAGNOSIS — N186 End stage renal disease: Secondary | ICD-10-CM | POA: Diagnosis not present

## 2023-07-01 DIAGNOSIS — N250 Renal osteodystrophy: Secondary | ICD-10-CM | POA: Diagnosis not present

## 2023-07-04 ENCOUNTER — Encounter: Payer: Self-pay | Admitting: Urology

## 2023-07-04 DIAGNOSIS — N250 Renal osteodystrophy: Secondary | ICD-10-CM | POA: Diagnosis not present

## 2023-07-04 DIAGNOSIS — Z992 Dependence on renal dialysis: Secondary | ICD-10-CM | POA: Diagnosis not present

## 2023-07-04 DIAGNOSIS — D631 Anemia in chronic kidney disease: Secondary | ICD-10-CM | POA: Diagnosis not present

## 2023-07-04 DIAGNOSIS — D509 Iron deficiency anemia, unspecified: Secondary | ICD-10-CM | POA: Diagnosis not present

## 2023-07-04 DIAGNOSIS — N2581 Secondary hyperparathyroidism of renal origin: Secondary | ICD-10-CM | POA: Diagnosis not present

## 2023-07-04 DIAGNOSIS — N186 End stage renal disease: Secondary | ICD-10-CM | POA: Diagnosis not present

## 2023-07-06 DIAGNOSIS — D631 Anemia in chronic kidney disease: Secondary | ICD-10-CM | POA: Diagnosis not present

## 2023-07-06 DIAGNOSIS — D509 Iron deficiency anemia, unspecified: Secondary | ICD-10-CM | POA: Diagnosis not present

## 2023-07-06 DIAGNOSIS — N250 Renal osteodystrophy: Secondary | ICD-10-CM | POA: Diagnosis not present

## 2023-07-06 DIAGNOSIS — N186 End stage renal disease: Secondary | ICD-10-CM | POA: Diagnosis not present

## 2023-07-06 DIAGNOSIS — Z992 Dependence on renal dialysis: Secondary | ICD-10-CM | POA: Diagnosis not present

## 2023-07-06 DIAGNOSIS — N2581 Secondary hyperparathyroidism of renal origin: Secondary | ICD-10-CM | POA: Diagnosis not present

## 2023-07-07 DIAGNOSIS — E042 Nontoxic multinodular goiter: Secondary | ICD-10-CM | POA: Diagnosis not present

## 2023-07-08 DIAGNOSIS — Z992 Dependence on renal dialysis: Secondary | ICD-10-CM | POA: Diagnosis not present

## 2023-07-08 DIAGNOSIS — N2581 Secondary hyperparathyroidism of renal origin: Secondary | ICD-10-CM | POA: Diagnosis not present

## 2023-07-08 DIAGNOSIS — D509 Iron deficiency anemia, unspecified: Secondary | ICD-10-CM | POA: Diagnosis not present

## 2023-07-08 DIAGNOSIS — N250 Renal osteodystrophy: Secondary | ICD-10-CM | POA: Diagnosis not present

## 2023-07-08 DIAGNOSIS — D631 Anemia in chronic kidney disease: Secondary | ICD-10-CM | POA: Diagnosis not present

## 2023-07-08 DIAGNOSIS — N186 End stage renal disease: Secondary | ICD-10-CM | POA: Diagnosis not present

## 2023-07-09 ENCOUNTER — Other Ambulatory Visit: Payer: Self-pay | Admitting: Cardiology

## 2023-07-09 DIAGNOSIS — Z992 Dependence on renal dialysis: Secondary | ICD-10-CM | POA: Diagnosis not present

## 2023-07-09 DIAGNOSIS — N186 End stage renal disease: Secondary | ICD-10-CM | POA: Diagnosis not present

## 2023-07-10 DIAGNOSIS — N250 Renal osteodystrophy: Secondary | ICD-10-CM | POA: Diagnosis not present

## 2023-07-10 DIAGNOSIS — N186 End stage renal disease: Secondary | ICD-10-CM | POA: Diagnosis not present

## 2023-07-10 DIAGNOSIS — Z992 Dependence on renal dialysis: Secondary | ICD-10-CM | POA: Diagnosis not present

## 2023-07-10 DIAGNOSIS — D631 Anemia in chronic kidney disease: Secondary | ICD-10-CM | POA: Diagnosis not present

## 2023-07-10 DIAGNOSIS — N2581 Secondary hyperparathyroidism of renal origin: Secondary | ICD-10-CM | POA: Diagnosis not present

## 2023-07-10 DIAGNOSIS — D509 Iron deficiency anemia, unspecified: Secondary | ICD-10-CM | POA: Diagnosis not present

## 2023-07-11 DIAGNOSIS — D509 Iron deficiency anemia, unspecified: Secondary | ICD-10-CM | POA: Diagnosis not present

## 2023-07-11 DIAGNOSIS — Z992 Dependence on renal dialysis: Secondary | ICD-10-CM | POA: Diagnosis not present

## 2023-07-11 DIAGNOSIS — N186 End stage renal disease: Secondary | ICD-10-CM | POA: Diagnosis not present

## 2023-07-11 DIAGNOSIS — N2581 Secondary hyperparathyroidism of renal origin: Secondary | ICD-10-CM | POA: Diagnosis not present

## 2023-07-11 DIAGNOSIS — D631 Anemia in chronic kidney disease: Secondary | ICD-10-CM | POA: Diagnosis not present

## 2023-07-13 DIAGNOSIS — D509 Iron deficiency anemia, unspecified: Secondary | ICD-10-CM | POA: Diagnosis not present

## 2023-07-13 DIAGNOSIS — Z992 Dependence on renal dialysis: Secondary | ICD-10-CM | POA: Diagnosis not present

## 2023-07-13 DIAGNOSIS — D631 Anemia in chronic kidney disease: Secondary | ICD-10-CM | POA: Diagnosis not present

## 2023-07-13 DIAGNOSIS — N2581 Secondary hyperparathyroidism of renal origin: Secondary | ICD-10-CM | POA: Diagnosis not present

## 2023-07-13 DIAGNOSIS — N186 End stage renal disease: Secondary | ICD-10-CM | POA: Diagnosis not present

## 2023-07-15 DIAGNOSIS — N186 End stage renal disease: Secondary | ICD-10-CM | POA: Diagnosis not present

## 2023-07-15 DIAGNOSIS — Z992 Dependence on renal dialysis: Secondary | ICD-10-CM | POA: Diagnosis not present

## 2023-07-15 DIAGNOSIS — D509 Iron deficiency anemia, unspecified: Secondary | ICD-10-CM | POA: Diagnosis not present

## 2023-07-15 DIAGNOSIS — D631 Anemia in chronic kidney disease: Secondary | ICD-10-CM | POA: Diagnosis not present

## 2023-07-15 DIAGNOSIS — N2581 Secondary hyperparathyroidism of renal origin: Secondary | ICD-10-CM | POA: Diagnosis not present

## 2023-07-18 DIAGNOSIS — D509 Iron deficiency anemia, unspecified: Secondary | ICD-10-CM | POA: Diagnosis not present

## 2023-07-18 DIAGNOSIS — N186 End stage renal disease: Secondary | ICD-10-CM | POA: Diagnosis not present

## 2023-07-18 DIAGNOSIS — N2581 Secondary hyperparathyroidism of renal origin: Secondary | ICD-10-CM | POA: Diagnosis not present

## 2023-07-18 DIAGNOSIS — Z992 Dependence on renal dialysis: Secondary | ICD-10-CM | POA: Diagnosis not present

## 2023-07-18 DIAGNOSIS — D631 Anemia in chronic kidney disease: Secondary | ICD-10-CM | POA: Diagnosis not present

## 2023-07-20 DIAGNOSIS — Z992 Dependence on renal dialysis: Secondary | ICD-10-CM | POA: Diagnosis not present

## 2023-07-20 DIAGNOSIS — D509 Iron deficiency anemia, unspecified: Secondary | ICD-10-CM | POA: Diagnosis not present

## 2023-07-20 DIAGNOSIS — N186 End stage renal disease: Secondary | ICD-10-CM | POA: Diagnosis not present

## 2023-07-20 DIAGNOSIS — N2581 Secondary hyperparathyroidism of renal origin: Secondary | ICD-10-CM | POA: Diagnosis not present

## 2023-07-20 DIAGNOSIS — D631 Anemia in chronic kidney disease: Secondary | ICD-10-CM | POA: Diagnosis not present

## 2023-07-22 DIAGNOSIS — N186 End stage renal disease: Secondary | ICD-10-CM | POA: Diagnosis not present

## 2023-07-22 DIAGNOSIS — N2581 Secondary hyperparathyroidism of renal origin: Secondary | ICD-10-CM | POA: Diagnosis not present

## 2023-07-22 DIAGNOSIS — D509 Iron deficiency anemia, unspecified: Secondary | ICD-10-CM | POA: Diagnosis not present

## 2023-07-22 DIAGNOSIS — D631 Anemia in chronic kidney disease: Secondary | ICD-10-CM | POA: Diagnosis not present

## 2023-07-22 DIAGNOSIS — Z992 Dependence on renal dialysis: Secondary | ICD-10-CM | POA: Diagnosis not present

## 2023-07-23 DIAGNOSIS — E8779 Other fluid overload: Secondary | ICD-10-CM | POA: Diagnosis not present

## 2023-07-23 DIAGNOSIS — Z992 Dependence on renal dialysis: Secondary | ICD-10-CM | POA: Diagnosis not present

## 2023-07-23 DIAGNOSIS — N186 End stage renal disease: Secondary | ICD-10-CM | POA: Diagnosis not present

## 2023-07-25 DIAGNOSIS — N2581 Secondary hyperparathyroidism of renal origin: Secondary | ICD-10-CM | POA: Diagnosis not present

## 2023-07-25 DIAGNOSIS — Z992 Dependence on renal dialysis: Secondary | ICD-10-CM | POA: Diagnosis not present

## 2023-07-25 DIAGNOSIS — D509 Iron deficiency anemia, unspecified: Secondary | ICD-10-CM | POA: Diagnosis not present

## 2023-07-25 DIAGNOSIS — N186 End stage renal disease: Secondary | ICD-10-CM | POA: Diagnosis not present

## 2023-07-25 DIAGNOSIS — D631 Anemia in chronic kidney disease: Secondary | ICD-10-CM | POA: Diagnosis not present

## 2023-07-26 DIAGNOSIS — Z23 Encounter for immunization: Secondary | ICD-10-CM | POA: Diagnosis not present

## 2023-07-27 DIAGNOSIS — D509 Iron deficiency anemia, unspecified: Secondary | ICD-10-CM | POA: Diagnosis not present

## 2023-07-27 DIAGNOSIS — N186 End stage renal disease: Secondary | ICD-10-CM | POA: Diagnosis not present

## 2023-07-27 DIAGNOSIS — Z992 Dependence on renal dialysis: Secondary | ICD-10-CM | POA: Diagnosis not present

## 2023-07-27 DIAGNOSIS — D631 Anemia in chronic kidney disease: Secondary | ICD-10-CM | POA: Diagnosis not present

## 2023-07-27 DIAGNOSIS — N2581 Secondary hyperparathyroidism of renal origin: Secondary | ICD-10-CM | POA: Diagnosis not present

## 2023-07-29 DIAGNOSIS — N2581 Secondary hyperparathyroidism of renal origin: Secondary | ICD-10-CM | POA: Diagnosis not present

## 2023-07-29 DIAGNOSIS — N186 End stage renal disease: Secondary | ICD-10-CM | POA: Diagnosis not present

## 2023-07-29 DIAGNOSIS — D509 Iron deficiency anemia, unspecified: Secondary | ICD-10-CM | POA: Diagnosis not present

## 2023-07-29 DIAGNOSIS — Z992 Dependence on renal dialysis: Secondary | ICD-10-CM | POA: Diagnosis not present

## 2023-07-29 DIAGNOSIS — D631 Anemia in chronic kidney disease: Secondary | ICD-10-CM | POA: Diagnosis not present

## 2023-07-31 ENCOUNTER — Other Ambulatory Visit: Payer: Self-pay | Admitting: Cardiology

## 2023-08-01 DIAGNOSIS — Z992 Dependence on renal dialysis: Secondary | ICD-10-CM | POA: Diagnosis not present

## 2023-08-01 DIAGNOSIS — D631 Anemia in chronic kidney disease: Secondary | ICD-10-CM | POA: Diagnosis not present

## 2023-08-01 DIAGNOSIS — Z794 Long term (current) use of insulin: Secondary | ICD-10-CM | POA: Diagnosis not present

## 2023-08-01 DIAGNOSIS — N186 End stage renal disease: Secondary | ICD-10-CM | POA: Diagnosis not present

## 2023-08-01 DIAGNOSIS — E119 Type 2 diabetes mellitus without complications: Secondary | ICD-10-CM | POA: Diagnosis not present

## 2023-08-01 DIAGNOSIS — D509 Iron deficiency anemia, unspecified: Secondary | ICD-10-CM | POA: Diagnosis not present

## 2023-08-01 DIAGNOSIS — N2581 Secondary hyperparathyroidism of renal origin: Secondary | ICD-10-CM | POA: Diagnosis not present

## 2023-08-03 ENCOUNTER — Telehealth: Payer: Self-pay | Admitting: Cardiology

## 2023-08-03 DIAGNOSIS — D631 Anemia in chronic kidney disease: Secondary | ICD-10-CM | POA: Diagnosis not present

## 2023-08-03 DIAGNOSIS — D509 Iron deficiency anemia, unspecified: Secondary | ICD-10-CM | POA: Diagnosis not present

## 2023-08-03 DIAGNOSIS — Z992 Dependence on renal dialysis: Secondary | ICD-10-CM | POA: Diagnosis not present

## 2023-08-03 DIAGNOSIS — N2581 Secondary hyperparathyroidism of renal origin: Secondary | ICD-10-CM | POA: Diagnosis not present

## 2023-08-03 DIAGNOSIS — N186 End stage renal disease: Secondary | ICD-10-CM | POA: Diagnosis not present

## 2023-08-03 MED ORDER — MIDODRINE HCL 10 MG PO TABS: 10.0000 mg | ORAL_TABLET | Freq: Three times a day (TID) | ORAL | 0 refills | Status: DC

## 2023-08-03 NOTE — Telephone Encounter (Signed)
*  STAT* If patient is at the pharmacy, call can be transferred to refill team.   1. Which medications need to be refilled? (please list name of each medication and dose if known)   midodrine (PROAMATINE) 10 MG tablet   2. Would you like to learn more about the convenience, safety, & potential cost savings by using the [facility]?   3. Are you open to using the [facility] (Type [facility]. ).  4. Which pharmacy/location (including street and city if local pharmacy) is medication to be sent to?  [facility] [address], [place] - [address]   5. Do they need a 30 day or 90 day supply?   90 day  Wife states patient is completely out of this medication.  Patient has appointment scheduled on 11/13.

## 2023-08-05 ENCOUNTER — Other Ambulatory Visit: Payer: Self-pay | Admitting: Student

## 2023-08-05 ENCOUNTER — Encounter (HOSPITAL_COMMUNITY): Payer: Self-pay

## 2023-08-05 ENCOUNTER — Other Ambulatory Visit (HOSPITAL_COMMUNITY): Payer: Self-pay | Admitting: Nurse Practitioner

## 2023-08-05 ENCOUNTER — Ambulatory Visit (HOSPITAL_COMMUNITY)
Admission: RE | Admit: 2023-08-05 | Discharge: 2023-08-05 | Disposition: A | Discharge status: Home / Self Care | Payer: Medicare Other | Source: Ambulatory Visit | Attending: Nurse Practitioner | Admitting: Nurse Practitioner

## 2023-08-05 ENCOUNTER — Other Ambulatory Visit: Payer: Self-pay

## 2023-08-05 DIAGNOSIS — T82868A Thrombosis of vascular prosthetic devices, implants and grafts, initial encounter: Secondary | ICD-10-CM | POA: Diagnosis not present

## 2023-08-05 DIAGNOSIS — Y841 Kidney dialysis as the cause of abnormal reaction of the patient, or of later complication, without mention of misadventure at the time of the procedure: Secondary | ICD-10-CM | POA: Insufficient documentation

## 2023-08-05 DIAGNOSIS — Z87891 Personal history of nicotine dependence: Secondary | ICD-10-CM | POA: Diagnosis not present

## 2023-08-05 DIAGNOSIS — N186 End stage renal disease: Secondary | ICD-10-CM

## 2023-08-05 DIAGNOSIS — I120 Hypertensive chronic kidney disease with stage 5 chronic kidney disease or end stage renal disease: Secondary | ICD-10-CM | POA: Diagnosis not present

## 2023-08-05 DIAGNOSIS — Z992 Dependence on renal dialysis: Secondary | ICD-10-CM | POA: Diagnosis not present

## 2023-08-05 DIAGNOSIS — Z8546 Personal history of malignant neoplasm of prostate: Secondary | ICD-10-CM | POA: Insufficient documentation

## 2023-08-05 DIAGNOSIS — T82858A Stenosis of vascular prosthetic devices, implants and grafts, initial encounter: Secondary | ICD-10-CM | POA: Diagnosis not present

## 2023-08-05 DIAGNOSIS — E1122 Type 2 diabetes mellitus with diabetic chronic kidney disease: Secondary | ICD-10-CM | POA: Diagnosis not present

## 2023-08-05 DIAGNOSIS — N2581 Secondary hyperparathyroidism of renal origin: Secondary | ICD-10-CM | POA: Diagnosis not present

## 2023-08-05 DIAGNOSIS — D631 Anemia in chronic kidney disease: Secondary | ICD-10-CM | POA: Diagnosis not present

## 2023-08-05 DIAGNOSIS — D509 Iron deficiency anemia, unspecified: Secondary | ICD-10-CM | POA: Diagnosis not present

## 2023-08-05 HISTORY — PX: IR THROMBECTOMY AV FISTULA W/THROMBOLYSIS/PTA INC/SHUNT/IMG LEFT: IMG6106

## 2023-08-05 LAB — CBC
HCT: 33.8 % — ABNORMAL LOW (ref 39.0–52.0)
Hemoglobin: 10.9 g/dL — ABNORMAL LOW (ref 13.0–17.0)
MCH: 33 pg (ref 26.0–34.0)
MCHC: 32.2 g/dL (ref 30.0–36.0)
MCV: 102.4 fL — ABNORMAL HIGH (ref 80.0–100.0)
Platelets: 169 10*3/uL (ref 150–400)
RBC: 3.3 MIL/uL — ABNORMAL LOW (ref 4.22–5.81)
RDW: 16.4 % — ABNORMAL HIGH (ref 11.5–15.5)
WBC: 9.9 10*3/uL (ref 4.0–10.5)
nRBC: 0 % (ref 0.0–0.2)

## 2023-08-05 LAB — BASIC METABOLIC PANEL
Anion gap: 18 — ABNORMAL HIGH (ref 5–15)
BUN: 78 mg/dL — ABNORMAL HIGH (ref 8–23)
CO2: 23 mmol/L (ref 22–32)
Calcium: 9.3 mg/dL (ref 8.9–10.3)
Chloride: 98 mmol/L (ref 98–111)
Creatinine, Ser: 13.23 mg/dL — ABNORMAL HIGH (ref 0.61–1.24)
GFR, Estimated: 4 mL/min — ABNORMAL LOW (ref >60)
Glucose, Bld: 84 mg/dL (ref 70–99)
Potassium: 4.9 mmol/L (ref 3.5–5.1)
Sodium: 139 mmol/L (ref 135–145)

## 2023-08-05 LAB — GLUCOSE, CAPILLARY
Glucose-Capillary: 89 mg/dL (ref 70–99)
Glucose-Capillary: 68 mg/dL — ABNORMAL LOW (ref 70–99)

## 2023-08-05 MED ORDER — MIDAZOLAM HCL 2 MG/2ML IJ SOLN
INTRAMUSCULAR | Status: AC
  Filled 2023-08-05: qty 2

## 2023-08-05 MED ORDER — LIDOCAINE HCL 1 % IJ SOLN
INTRAMUSCULAR | Status: AC
  Filled 2023-08-05: qty 20

## 2023-08-05 MED ORDER — HEPARIN SODIUM (PORCINE) 1000 UNIT/ML IJ SOLN
INTRAMUSCULAR | Status: AC | PRN
Start: 2023-08-05 — End: 2023-08-05
  Administered 2023-08-05: 3000 [IU] via INTRAVENOUS

## 2023-08-05 MED ORDER — SODIUM CHLORIDE 0.9 % IV SOLN: INTRAVENOUS | Status: DC

## 2023-08-05 MED ORDER — MIDAZOLAM HCL 2 MG/2ML IJ SOLN
INTRAMUSCULAR | Status: AC | PRN
Start: 2023-08-05 — End: 2023-08-05
  Administered 2023-08-05: .5 mg via INTRAVENOUS

## 2023-08-05 MED ORDER — FENTANYL CITRATE (PF) 100 MCG/2ML IJ SOLN
INTRAMUSCULAR | Status: AC
  Filled 2023-08-05: qty 2

## 2023-08-05 MED ORDER — LIDOCAINE HCL 1 % IJ SOLN
20.0000 mL | Freq: Once | INTRAMUSCULAR | Status: AC
  Administered 2023-08-05: 20 mL

## 2023-08-05 MED ORDER — HEPARIN SODIUM (PORCINE) 1000 UNIT/ML IJ SOLN
INTRAMUSCULAR | Status: AC
  Filled 2023-08-05: qty 10

## 2023-08-05 MED ORDER — ALTEPLASE 2 MG IJ SOLR
INTRAMUSCULAR | Status: AC
  Filled 2023-08-05: qty 4

## 2023-08-05 MED ORDER — IOHEXOL 300 MG/ML  SOLN
100.0000 mL | Freq: Once | INTRAMUSCULAR | Status: AC | PRN
  Administered 2023-08-05: 60 mL via INTRA_ARTERIAL

## 2023-08-05 MED ORDER — FENTANYL CITRATE (PF) 100 MCG/2ML IJ SOLN
INTRAMUSCULAR | Status: AC | PRN
Start: 2023-08-05 — End: 2023-08-05
  Administered 2023-08-05 (×2): 25 ug via INTRAVENOUS

## 2023-08-05 NOTE — Consult Note (Signed)
Chief Complaint: Patient was seen in consultation today for clotted Left upper Arm graft clotted Patient Status: Out-patient  History of Present Illness:  Todd Robertson is a 77 y.o. male   Full Code Status per patient  9/26 Patient stated he experienced pain in  Left Upper Arm AV Fistula 9/27 Patient unable to have hemodialysis treatment today secondary Left Upper Arm AV Fistula access dysfunction. Last hemodialysis treatment   12/23: IR Thrombectomy  1. LEFT UPPER EXTREMITY DIALYSIS CIRCUIT STUDY  2. PHARMACO-MECHANICAL THROMBECTOMY of THROMBOSED ARTERIOVENOUS  GRAFT (AVG)  3. BALLOON ANGIOPLASTY of VENOUS ANASTOMOSIS and SUBCLAVIAN VEIN ...   Significant medical history for multiple Thrombectomies in LUA, HTN, DM, Prostate CA     Past Medical History:  Diagnosis Date   Arthritis    Cancer (HCC)    prostate   Cardiomyopathy    secondary   CKD (chronic kidney disease)    DM2 (diabetes mellitus, type 2) (HCC)    Gout    HTN (hypertension)    unspec   Hypercholesterolemia    Nonischemic cardiomyopathy (HCC)    a. EF 40-50% by most recent 2-D echo b. EF 25% by cardiac cath in 2004   Overweight(278.02)     Past Surgical History:  Procedure Laterality Date   AV FISTULA PLACEMENT Left 06/06/2020   Procedure: LEFT ARM ARTERIOVENOUS (AV) GRAFT USING 45CM GORTEX;  Surgeon: Larina Earthly, MD;  Location: [facility];  Service: Vascular;  Laterality: Left;   AV FISTULA PLACEMENT Left 06/08/2022   Procedure: INSERTION OF ARTERIOVENOUS GORE-TEX GRAFT UPPER ARM;  Surgeon: Larina Earthly, MD;  Location: [facility];  Service: Vascular;  Laterality: Left;   AVGG REMOVAL Left 07/27/2022   Procedure: EXCISION OF INFECTED PORTION OF LEFT FOREARM ARTERIOVENOUS GORETEX GRAFT (AVGG);  Surgeon: Larina Earthly, MD;  Location: [facility];  Service: Vascular;  Laterality: Left;   COLONOSCOPY     HERNIA REPAIR     INSERTION OF DIALYSIS CATHETER Right 06/06/2020   Procedure: INSERTION OF DIALYSIS  CATHETER USING 23CM DOUBLE LUMEN CATHETER;  Surgeon: Larina Earthly, MD;  Location: [facility];  Service: Vascular;  Laterality: Right;   IR ANGIOGRAM EXTREMITY LEFT  10/25/2022   IR FLUORO GUIDE CV LINE RIGHT  05/20/2020   IR THROMBECTOMY AV FISTULA W/THROMBOLYSIS/PTA INC/SHUNT/IMG LEFT Left 10/25/2022   IR US GUIDE VASC ACCESS LEFT  10/25/2022   IR US GUIDE VASC ACCESS RIGHT  05/20/2020   KNEE ARTHROPLASTY Left 08/08/2017   Procedure: LEFT TOTAL KNEE ARTHROPLASTY WITH COMPUTER NAVIGATION;  Surgeon: Samson Frederic, MD;  Location: [facility];  Service: Orthopedics;  Laterality: Left;  Needs RNFA   KNEE ARTHROPLASTY Right 11/17/2017   Procedure: RIGHT TOTAL KNEE ARTHROPLASTY WITH COMPUTER NAVIGATION;  Surgeon: Samson Frederic, MD;  Location: [facility];  Service: Orthopedics;  Laterality: Right;  NEEDS RNFA   REMOVAL OF A DIALYSIS CATHETER Right 09/07/2022   Procedure: MINOR REMOVAL OF A TUNNELED DIALYSIS CATHETER;  Surgeon: Larina Earthly, MD;  Location: [facility];  Service: Vascular;  Laterality: Right;   ROBOT ASSISTED LAPAROSCOPIC NEPHRECTOMY Right 05/16/2020   Procedure: XI ROBOTIC ASSISTED LAPAROSCOPIC NEPHRECTOMY;  Surgeon: Malen Gauze, MD;  Location: [facility];  Service: Urology;  Laterality: Right;  2.5 hrs    Allergies: Patient has no known allergies.  Medications: Prior to Admission medications   Medication Sig Start Date End Date Taking? Authorizing Provider  acetaminophen (TYLENOL) 500 MG tablet Take 500-1,000 mg by mouth every 6 (six) hours as  needed for moderate pain.   Yes [provider]  albuterol (VENTOLIN HFA) 108 (90 Base) MCG/ACT inhaler Inhale 1-2 puffs into the lungs every 6 (six) hours as needed for wheezing or shortness of breath. Patient taking differently: Inhale 1-2 puffs into the lungs 2 (two) times daily. 06/24/20  Yes Angiulli, Mcarthur Rossetti, PA-C  allopurinol (ZYLOPRIM) 300 MG tablet Take 300 mg by mouth daily. 10/21/20  Yes [provider]  atorvastatin (LIPITOR)  40 MG tablet Take 1 tablet (40 mg total) by mouth daily. Patient taking differently: Take 40 mg by mouth at bedtime. 06/24/20  Yes Angiulli, Mcarthur Rossetti, PA-C  BREO ELLIPTA 100-25 MCG/ACT AEPB Inhale 1 puff into the lungs daily. 05/18/22  Yes [provider]  calcium acetate (PHOSLO) 667 MG capsule Take 3 capsules (2,001 mg total) by mouth 3 (three) times daily with meals. 06/24/20  Yes Angiulli, Mcarthur Rossetti, PA-C  cetirizine (ZYRTEC) 10 MG tablet Take 10 mg by mouth daily.   Yes [provider]  Cholecalciferol (VITAMIN D) 50 MCG (2000 UT) tablet Take 1 tablet (2,000 Units total) by mouth daily. 06/24/20  Yes Angiulli, Mcarthur Rossetti, PA-C  methocarbamol (ROBAXIN) 500 MG tablet Take 500 mg by mouth 3 (three) times a week. Take Sun, Tue, and Thurs (day before dialysis) 05/11/22  Yes [provider]  metoprolol tartrate (LOPRESSOR) 25 MG tablet Take 0.5 tablets (12.5 mg total) by mouth 2 (two) times daily. Patient taking differently: Take 12.5 mg by mouth 2 (two) times daily. after dialysis 07/16/20  Yes Netta Neat., NP  midodrine (PROAMATINE) 10 MG tablet Take 1 tablet (10 mg total) by mouth 3 (three) times daily with meals. 08/03/23  Yes BranchDorothe Pea, MD  Multiple Vitamins-Minerals (CENTRUM SILVER 50+MEN) TABS Take 1 tablet by mouth daily.   Yes [provider]  Naphazoline HCl (CLEAR EYES OP) Place 1 drop into both eyes daily.   Yes [provider]  senna (SENOKOT) 8.6 MG tablet Take 1-2 tablets by mouth daily as needed for constipation.   Yes [provider]  tamsulosin (FLOMAX) 0.4 MG CAPS capsule Take 1 capsule (0.4 mg total) by mouth daily. 12/30/22  Yes Bjorn Pippin, MD  Polyethylene Glycol 3350 (MIRALAX PO) Take 17 g by mouth daily as needed (constipation).    [provider]  trolamine salicylate (ASPERCREME) 10 % cream Apply 1 Application topically as needed for muscle pain.    [provider]     Family History  Problem  Relation Age of Onset   Hypertension Mother    Kidney disease Mother    Heart disease Father    Kidney disease Other     Social History   Socioeconomic History   Marital status: Married    Spouse name: Not on file   Number of children: 2   Years of education: Not on file   Highest education level: Not on file  Occupational History   Occupation: retired  Tobacco Use   Smoking status: Former    Current packs/day: 0.00    Average packs/day: 0.5 packs/day for 20.0 years (10.0 ttl pk-yrs)    Types: Cigarettes    Start date: 02/12/1966    Quit date: 11/08/1984    Years since quitting: 38.7   Smokeless tobacco: Never  Vaping Use   Vaping status: Never Used  Substance and Sexual Activity   Alcohol use: No    Alcohol/week: 0.0 standard drinks of alcohol   Drug use: No   Sexual  activity: Yes    Birth control/protection: None  Other Topics Concern   Not on file  Social History Narrative   Full time.    Social Determinants of Health   Financial Resource Strain: Not on file  Food Insecurity: No Food Insecurity (12/04/2020)   Received from [facility], [facility]   Hunger Vital Sign    Worried About Running Out of Food in the Last Year: Never true    Ran Out of Food in the Last Year: Never true  Transportation Needs: Not on file  Physical Activity: Not on file  Stress: No Stress Concern Present (01/27/2021)   Received from [facility], [facility] - Occupational Stress Questionnaire    Feeling of Stress : Only a little  Social Connections: Unknown (03/23/2022)   Received from [facility], [facility]   Social Network    Social Network: Not on file     Review of Systems: A 12 point ROS discussed and pertinent positives are indicated in the HPI above.  All other systems are negative.  Review of Systems  Constitutional:  Negative for fatigue and fever.  Respiratory:  Negative for chest tightness and shortness of breath.    Cardiovascular:  Negative for chest pain.  Gastrointestinal:  Negative for abdominal pain.  Skin: Negative.   Neurological:  Negative for headaches.  Psychiatric/Behavioral:  Negative for confusion.     Vital Signs: BP (!) 114/58   Pulse 65   Temp 98.3 F (36.8 C) (Oral)   Ht 6\' 2"  (1.88 m)   Wt 230 lb (104.3 kg)   SpO2 96%   BMI 29.53 kg/m   Advance Care Plan:   Discussed with patient   Physical Exam Constitutional:      Appearance: Normal appearance.  Cardiovascular:     Rate and Rhythm: Normal rate and regular rhythm.  Pulmonary:     Effort: Pulmonary effort is normal.     Breath sounds: Normal breath sounds. No wheezing.  Abdominal:     Palpations: Abdomen is soft.  Musculoskeletal:        General: No tenderness.  Skin:    General: Skin is warm and dry.  Neurological:     Mental Status: He is alert.  Psychiatric:        Mood and Affect: Mood normal.        Behavior: Behavior normal.     Imaging: None  Labs:  CBC: Recent Labs    08/05/23 1225  WBC 9.9  HGB 10.9*  HCT 33.8*  PLT 169    COAGS: No results for input(s): "INR", "APTT" in the last 8760 hours.  BMP: No results for input(s): "NA", "K", "CL", "CO2", "GLUCOSE", "BUN", "CALCIUM", "CREATININE", "GFRNONAA", "GFRAA" in the last 8760 hours.  Invalid input(s): "CMP"  LIVER FUNCTION TESTS: No results for input(s): "BILITOT", "AST", "ALT", "ALKPHOS", "PROT", "ALBUMIN" in the last 8760 hours.  TUMOR MARKERS: No results for input(s): "AFPTM", "CEA", "CA199", "CHROMGRNA" in the last 8760 hours.  Assessment and Plan: 9/27 Fistulogram with possible interventions in IR  Risks and benefits of the dialysis circuit study were discussed with the patient including, but not limited to bleeding, infection, vascular injury, and inability to improve the function of the fistula/graft which would require placement of a tunneled dialysis catheter.  All of the patient's questions were answered, patient  is agreeable to proceed. Consent signed and in chart.  Thank you for this interesting consult.  I greatly enjoyed  meeting Salley Scarlet and look forward to participating in their care.  A copy of this report was sent to the requesting provider on this date.  Electronically Signed: Ardith Dark, NP 08/05/2023, 12:51 PM   I spent a total of  30 Minutes   in face to face in clinical consultation, greater than 50% of which was counseling/coordinating care for [facility]

## 2023-08-05 NOTE — Procedures (Signed)
Interventional Radiology Procedure Note  Procedure:   US guided access LUE dialysis graft x 2 Mechanical/pharm thrombectomy with 3mg  tPA PTA of venous  outflow stenosis, with 7mm PTA, 8mm DEB.   Findings: excellent thrill on completion  Complications: None  Recommendations:  - Ok to use - Do not submerge for 7 days - Routine wound care - DC 1 hr when goals met  Signed,  Yvone Neu. Loreta Ave, DO

## 2023-08-06 DIAGNOSIS — N2581 Secondary hyperparathyroidism of renal origin: Secondary | ICD-10-CM | POA: Diagnosis not present

## 2023-08-06 DIAGNOSIS — D631 Anemia in chronic kidney disease: Secondary | ICD-10-CM | POA: Diagnosis not present

## 2023-08-06 DIAGNOSIS — Z992 Dependence on renal dialysis: Secondary | ICD-10-CM | POA: Diagnosis not present

## 2023-08-06 DIAGNOSIS — N186 End stage renal disease: Secondary | ICD-10-CM | POA: Diagnosis not present

## 2023-08-06 DIAGNOSIS — D509 Iron deficiency anemia, unspecified: Secondary | ICD-10-CM | POA: Diagnosis not present

## 2023-08-08 DIAGNOSIS — N2581 Secondary hyperparathyroidism of renal origin: Secondary | ICD-10-CM | POA: Diagnosis not present

## 2023-08-08 DIAGNOSIS — Z992 Dependence on renal dialysis: Secondary | ICD-10-CM | POA: Diagnosis not present

## 2023-08-08 DIAGNOSIS — D509 Iron deficiency anemia, unspecified: Secondary | ICD-10-CM | POA: Diagnosis not present

## 2023-08-08 DIAGNOSIS — N186 End stage renal disease: Secondary | ICD-10-CM | POA: Diagnosis not present

## 2023-08-08 DIAGNOSIS — D631 Anemia in chronic kidney disease: Secondary | ICD-10-CM | POA: Diagnosis not present

## 2023-08-09 ENCOUNTER — Encounter (HOSPITAL_COMMUNITY): Payer: Self-pay

## 2023-08-09 ENCOUNTER — Other Ambulatory Visit (HOSPITAL_COMMUNITY): Payer: Self-pay | Admitting: Nurse Practitioner

## 2023-08-09 DIAGNOSIS — N186 End stage renal disease: Secondary | ICD-10-CM

## 2023-08-09 DIAGNOSIS — E8779 Other fluid overload: Secondary | ICD-10-CM | POA: Diagnosis not present

## 2023-08-09 DIAGNOSIS — Z992 Dependence on renal dialysis: Secondary | ICD-10-CM | POA: Diagnosis not present

## 2023-08-09 HISTORY — PX: IR US GUIDE VASC ACCESS LEFT: IMG2389

## 2023-08-10 DIAGNOSIS — D631 Anemia in chronic kidney disease: Secondary | ICD-10-CM | POA: Diagnosis not present

## 2023-08-10 DIAGNOSIS — Z992 Dependence on renal dialysis: Secondary | ICD-10-CM | POA: Diagnosis not present

## 2023-08-10 DIAGNOSIS — N2581 Secondary hyperparathyroidism of renal origin: Secondary | ICD-10-CM | POA: Diagnosis not present

## 2023-08-10 DIAGNOSIS — D509 Iron deficiency anemia, unspecified: Secondary | ICD-10-CM | POA: Diagnosis not present

## 2023-08-10 DIAGNOSIS — N186 End stage renal disease: Secondary | ICD-10-CM | POA: Diagnosis not present

## 2023-08-10 DIAGNOSIS — N250 Renal osteodystrophy: Secondary | ICD-10-CM | POA: Diagnosis not present

## 2023-08-12 DIAGNOSIS — N250 Renal osteodystrophy: Secondary | ICD-10-CM | POA: Diagnosis not present

## 2023-08-12 DIAGNOSIS — D509 Iron deficiency anemia, unspecified: Secondary | ICD-10-CM | POA: Diagnosis not present

## 2023-08-12 DIAGNOSIS — N2581 Secondary hyperparathyroidism of renal origin: Secondary | ICD-10-CM | POA: Diagnosis not present

## 2023-08-12 DIAGNOSIS — Z992 Dependence on renal dialysis: Secondary | ICD-10-CM | POA: Diagnosis not present

## 2023-08-12 DIAGNOSIS — D631 Anemia in chronic kidney disease: Secondary | ICD-10-CM | POA: Diagnosis not present

## 2023-08-12 DIAGNOSIS — N186 End stage renal disease: Secondary | ICD-10-CM | POA: Diagnosis not present

## 2023-08-15 DIAGNOSIS — Z992 Dependence on renal dialysis: Secondary | ICD-10-CM | POA: Diagnosis not present

## 2023-08-15 DIAGNOSIS — N2581 Secondary hyperparathyroidism of renal origin: Secondary | ICD-10-CM | POA: Diagnosis not present

## 2023-08-15 DIAGNOSIS — D631 Anemia in chronic kidney disease: Secondary | ICD-10-CM | POA: Diagnosis not present

## 2023-08-15 DIAGNOSIS — N250 Renal osteodystrophy: Secondary | ICD-10-CM | POA: Diagnosis not present

## 2023-08-15 DIAGNOSIS — N186 End stage renal disease: Secondary | ICD-10-CM | POA: Diagnosis not present

## 2023-08-15 DIAGNOSIS — D509 Iron deficiency anemia, unspecified: Secondary | ICD-10-CM | POA: Diagnosis not present

## 2023-08-17 DIAGNOSIS — T82868A Thrombosis of vascular prosthetic devices, implants and grafts, initial encounter: Secondary | ICD-10-CM | POA: Diagnosis not present

## 2023-08-17 DIAGNOSIS — T82858A Stenosis of vascular prosthetic devices, implants and grafts, initial encounter: Secondary | ICD-10-CM | POA: Diagnosis not present

## 2023-08-17 DIAGNOSIS — N186 End stage renal disease: Secondary | ICD-10-CM | POA: Diagnosis not present

## 2023-08-17 DIAGNOSIS — Z992 Dependence on renal dialysis: Secondary | ICD-10-CM | POA: Diagnosis not present

## 2023-08-18 DIAGNOSIS — N250 Renal osteodystrophy: Secondary | ICD-10-CM | POA: Diagnosis not present

## 2023-08-18 DIAGNOSIS — D509 Iron deficiency anemia, unspecified: Secondary | ICD-10-CM | POA: Diagnosis not present

## 2023-08-18 DIAGNOSIS — N2581 Secondary hyperparathyroidism of renal origin: Secondary | ICD-10-CM | POA: Diagnosis not present

## 2023-08-18 DIAGNOSIS — D631 Anemia in chronic kidney disease: Secondary | ICD-10-CM | POA: Diagnosis not present

## 2023-08-18 DIAGNOSIS — N186 End stage renal disease: Secondary | ICD-10-CM | POA: Diagnosis not present

## 2023-08-18 DIAGNOSIS — Z992 Dependence on renal dialysis: Secondary | ICD-10-CM | POA: Diagnosis not present

## 2023-08-19 DIAGNOSIS — N2581 Secondary hyperparathyroidism of renal origin: Secondary | ICD-10-CM | POA: Diagnosis not present

## 2023-08-19 DIAGNOSIS — D631 Anemia in chronic kidney disease: Secondary | ICD-10-CM | POA: Diagnosis not present

## 2023-08-19 DIAGNOSIS — N250 Renal osteodystrophy: Secondary | ICD-10-CM | POA: Diagnosis not present

## 2023-08-19 DIAGNOSIS — D509 Iron deficiency anemia, unspecified: Secondary | ICD-10-CM | POA: Diagnosis not present

## 2023-08-19 DIAGNOSIS — Z992 Dependence on renal dialysis: Secondary | ICD-10-CM | POA: Diagnosis not present

## 2023-08-19 DIAGNOSIS — N186 End stage renal disease: Secondary | ICD-10-CM | POA: Diagnosis not present

## 2023-08-22 ENCOUNTER — Ambulatory Visit
Admission: RE | Admit: 2023-08-22 | Discharge: 2023-08-22 | Disposition: A | Discharge status: Home / Self Care | Payer: Medicare Other | Attending: Vascular Surgery | Admitting: Vascular Surgery

## 2023-08-22 ENCOUNTER — Other Ambulatory Visit (INDEPENDENT_AMBULATORY_CARE_PROVIDER_SITE_OTHER): Payer: Self-pay | Admitting: Vascular Surgery

## 2023-08-22 ENCOUNTER — Other Ambulatory Visit: Payer: Self-pay

## 2023-08-22 ENCOUNTER — Encounter
Admission: RE | Disposition: A | Discharge status: Home / Self Care | Payer: Self-pay | Source: Home / Self Care | Attending: Vascular Surgery

## 2023-08-22 DIAGNOSIS — E1122 Type 2 diabetes mellitus with diabetic chronic kidney disease: Secondary | ICD-10-CM | POA: Insufficient documentation

## 2023-08-22 DIAGNOSIS — Z87891 Personal history of nicotine dependence: Secondary | ICD-10-CM | POA: Diagnosis not present

## 2023-08-22 DIAGNOSIS — I120 Hypertensive chronic kidney disease with stage 5 chronic kidney disease or end stage renal disease: Secondary | ICD-10-CM | POA: Diagnosis not present

## 2023-08-22 DIAGNOSIS — T82868A Thrombosis of vascular prosthetic devices, implants and grafts, initial encounter: Secondary | ICD-10-CM | POA: Insufficient documentation

## 2023-08-22 DIAGNOSIS — Z992 Dependence on renal dialysis: Secondary | ICD-10-CM | POA: Diagnosis not present

## 2023-08-22 DIAGNOSIS — N186 End stage renal disease: Secondary | ICD-10-CM | POA: Diagnosis not present

## 2023-08-22 DIAGNOSIS — I428 Other cardiomyopathies: Secondary | ICD-10-CM | POA: Diagnosis not present

## 2023-08-22 DIAGNOSIS — Y841 Kidney dialysis as the cause of abnormal reaction of the patient, or of later complication, without mention of misadventure at the time of the procedure: Secondary | ICD-10-CM | POA: Insufficient documentation

## 2023-08-22 HISTORY — PX: PERIPHERAL VASCULAR THROMBECTOMY: CATH118306

## 2023-08-22 LAB — POTASSIUM (ARMC VASCULAR LAB ONLY): Potassium (ARMC vascular lab): 5.3 mmol/L — ABNORMAL HIGH (ref 3.5–5.1)

## 2023-08-22 LAB — GLUCOSE, CAPILLARY: Glucose-Capillary: 70 mg/dL (ref 70–99)

## 2023-08-22 SURGERY — PERIPHERAL VASCULAR THROMBECTOMY
Anesthesia: Moderate Sedation | Laterality: Left

## 2023-08-22 MED ORDER — FENTANYL CITRATE (PF) 100 MCG/2ML IJ SOLN
INTRAMUSCULAR | Status: AC
  Filled 2023-08-22: qty 2

## 2023-08-22 MED ORDER — FENTANYL CITRATE (PF) 100 MCG/2ML IJ SOLN
INTRAMUSCULAR | Status: DC | PRN
  Administered 2023-08-22: 50 ug via INTRAVENOUS

## 2023-08-22 MED ORDER — CEFAZOLIN SODIUM-DEXTROSE 1-4 GM/50ML-% IV SOLN
INTRAVENOUS | Status: AC
  Filled 2023-08-22: qty 50

## 2023-08-22 MED ORDER — MIDAZOLAM HCL 5 MG/5ML IJ SOLN
INTRAMUSCULAR | Status: AC
  Filled 2023-08-22: qty 5

## 2023-08-22 MED ORDER — LIDOCAINE-EPINEPHRINE (PF) 1 %-1:200000 IJ SOLN
INTRAMUSCULAR | Status: DC | PRN
  Administered 2023-08-22: 10 mL

## 2023-08-22 MED ORDER — CEFAZOLIN SODIUM-DEXTROSE 1-4 GM/50ML-% IV SOLN
INTRAVENOUS | Status: DC | PRN
  Administered 2023-08-22: 1 g via INTRAVENOUS

## 2023-08-22 MED ORDER — IODIXANOL 320 MG/ML IV SOLN
INTRAVENOUS | Status: DC | PRN
  Administered 2023-08-22: 25 mL

## 2023-08-22 MED ORDER — HEPARIN (PORCINE) IN NACL 1000-0.9 UT/500ML-% IV SOLN
INTRAVENOUS | Status: DC | PRN
  Administered 2023-08-22: 1000 mL

## 2023-08-22 MED ORDER — SODIUM CHLORIDE 0.9 % IV SOLN: INTRAVENOUS | Status: DC

## 2023-08-22 MED ORDER — MIDAZOLAM HCL 2 MG/2ML IJ SOLN
INTRAMUSCULAR | Status: DC | PRN
  Administered 2023-08-22: 2 mg via INTRAVENOUS

## 2023-08-22 MED ORDER — HEPARIN SODIUM (PORCINE) 1000 UNIT/ML IJ SOLN
INTRAMUSCULAR | Status: DC | PRN
  Administered 2023-08-22: 4000 [IU] via INTRAVENOUS

## 2023-08-22 MED ORDER — HEPARIN SODIUM (PORCINE) 1000 UNIT/ML IJ SOLN
INTRAMUSCULAR | Status: AC
  Filled 2023-08-22: qty 10

## 2023-08-22 SURGICAL SUPPLY — 9 items
CANNULA 5F STIFF (CANNULA) IMPLANT
CATH EMBOLECTOMY 5FR (BALLOONS) IMPLANT
CATH THROMBEC 7F 65 CLEANER15 (CATHETERS) IMPLANT
COVER PROBE ULTRASOUND 5X96 (MISCELLANEOUS) IMPLANT
DRAPE BRACHIAL (DRAPES) IMPLANT
PACK ANGIOGRAPHY (CUSTOM PROCEDURE TRAY) ×1 IMPLANT
SHEATH BRITE TIP 6FRX5.5 (SHEATH) IMPLANT
SHEATH BRITE TIP 7FRX5.5 (SHEATH) IMPLANT
SUT MNCRL AB 4-0 PS2 18 (SUTURE) IMPLANT

## 2023-08-22 NOTE — Op Note (Signed)
[facility]    OPERATIVE NOTE   PROCEDURE: 1.  Left brachial artery to axillary vein arteriovenous graft cannulation under ultrasound guidance in both a retrograde and then antegrade fashion crossing 2.  Left arm shuntogram and central venogram 3.  Mechanical thrombectomy to the left brachial artery to axillary vein AV graft with the cleaner device 4.  Fogarty embolectomy for residual arterial plug   PRE-OPERATIVE DIAGNOSIS: 1. ESRD 2.  Thrombosed left brachial artery to axillary vein arteriovenous graft  POST-OPERATIVE DIAGNOSIS: same as above   SURGEON: Todd Barren, MD  ANESTHESIA: local with Moderate Conscious Sedation for approximately 24 minutes using 2 mg of Versed and 50 mcg of Fentanyl  ESTIMATED BLOOD LOSS: 10 cc  FINDING(S): Thrombosed graft that was opened with mechanical thrombectomy and Fogarty embolectomy without obvious residual stenosis  SPECIMEN(S):  None  CONTRAST: 25 cc  FLUORO TIME: 2.1 minutes  INDICATIONS: Patient is a 77 y.o.male who presents with a thrombosed left brachial artery to axillary vein arteriovenous graft.  The patient is scheduled for an attempted declot and shuntogram.  The patient is aware the risks include but are not limited to: bleeding, infection, thrombosis of the cannulated access, and possible anaphylactic reaction to the contrast.  The patient is aware of the risks of the procedure and elects to proceed forward.  DESCRIPTION: After full informed written consent was obtained, the patient was brought back to the angiography suite and placed supine upon the angiography table.  The patient was connected to monitoring equipment. Moderate conscious sedation was administered with a face to face encounter with the patient throughout the procedure with my supervision of the RN administering medicines and monitoring the patient's vital signs, pulse oximetry, telemetry and mental status throughout from the start of the  procedure until the patient was taken to the recovery room. The left arm was prepped and draped in the standard fashion for a percutaneous access intervention.  Under ultrasound guidance, the left brachial artery to axillary vein arteriovenous graft was cannulated with a micropuncture needle under direct ultrasound guidance due to the pulseless nature of the graft in both an antegrade and a retrograde fashion crossing, and permanent images were performed.  The microwire was advanced and the needle was exchanged for the a microsheath.  I then upsized to a 7 Fr Sheath and imaging was performed.  Hand injections were completed to image the access including the central venous system. This demonstrated no flow within the AV graft.  Based on the images, this patient will need extensive treatment to salvage the graft. I then gave the patient 4000 units of intravenous heparin.  Mechanical thrombectomy using the Cleaner device was then performed throughout the graft and into the axillary vein. A residual arterial plug was also seen at the arterial anastomosis. An attempt to clear the arterial plug was done with 3 passes of the Fogarty embolectomy balloon. This resulted in resolution of the arterial plug, and clearance of the arterial side of the graft.  There was now a patent graft without any obvious stenosis in the central venous outflow appeared to be patent as well.  No further intervention was required.   A 4-0 Monocryl purse-string suture was sewn around the sheath at each location.  The sheath was removed while tying down the suture at each access site.  A sterile bandage was applied to the puncture sites.  COMPLICATIONS: None  CONDITION: Stable   Todd Robertson 08/22/2023 3:31 PM   This note was  created with Dragon Medical transcription system. Any errors in dictation are purely unintentional.

## 2023-08-22 NOTE — H&P (Signed)
[facility] Admission History & Physical  MRN : [id number]  Todd Robertson is a 77 y.o. (1946-06-20) male who presents with chief complaint of No chief complaint on file. Marland Kitchen  History of Present Illness: I am asked to evaluate the patient by the dialysis center. The patient was sent here because they were unable to cannulate the left arm AVG this morning. Furthermore the Center states there is no thrill or bruit. The patient states this is the first dialysis run to be missed. This problem is acute in onset and has been present for approximately 2 days. The patient is unaware of any other change.   Patient denies pain or tenderness overlying the access.  There is no pain with dialysis.  The patient denies hand pain or finger pain consistent with steal syndrome.    There have been many past interventions or declots of this access.  He had a declot done in [place] last month.    No current facility-administered medications for this encounter.    Past Medical History:  Diagnosis Date   Arthritis    Cancer (HCC)    prostate   Cardiomyopathy    secondary   CKD (chronic kidney disease)    DM2 (diabetes mellitus, type 2) (HCC)    Gout    HTN (hypertension)    unspec   Hypercholesterolemia    Nonischemic cardiomyopathy (HCC)    a. EF 40-50% by most recent 2-D echo b. EF 25% by cardiac cath in 2004   Overweight(278.02)     Past Surgical History:  Procedure Laterality Date   AV FISTULA PLACEMENT Left 06/06/2020   Procedure: LEFT ARM ARTERIOVENOUS (AV) GRAFT USING 45CM GORTEX;  Surgeon: Larina Earthly, MD;  Location: [facility] OR;  Service: Vascular;  Laterality: Left;   AV FISTULA PLACEMENT Left 06/08/2022   Procedure: INSERTION OF ARTERIOVENOUS GORE-TEX GRAFT UPPER ARM;  Surgeon: Larina Earthly, MD;  Location: [facility] ORS;  Service: Vascular;  Laterality: Left;   AVGG REMOVAL Left 07/27/2022   Procedure: EXCISION OF INFECTED PORTION OF LEFT FOREARM ARTERIOVENOUS GORETEX  GRAFT (AVGG);  Surgeon: Larina Earthly, MD;  Location: [facility];  Service: Vascular;  Laterality: Left;   COLONOSCOPY     HERNIA REPAIR     INSERTION OF DIALYSIS CATHETER Right 06/06/2020   Procedure: INSERTION OF DIALYSIS CATHETER USING 23CM DOUBLE LUMEN CATHETER;  Surgeon: Larina Earthly, MD;  Location: [facility] OR;  Service: Vascular;  Laterality: Right;   IR ANGIOGRAM EXTREMITY LEFT  10/25/2022   IR FLUORO GUIDE CV LINE RIGHT  05/20/2020   IR THROMBECTOMY AV FISTULA W/THROMBOLYSIS/PTA INC/SHUNT/IMG LEFT Left 10/25/2022   IR THROMBECTOMY AV FISTULA W/THROMBOLYSIS/PTA INC/SHUNT/IMG LEFT Left 08/05/2023   IR US GUIDE VASC ACCESS LEFT  10/25/2022   IR US GUIDE VASC ACCESS LEFT  08/09/2023   IR US GUIDE VASC ACCESS RIGHT  05/20/2020   KNEE ARTHROPLASTY Left 08/08/2017   Procedure: LEFT TOTAL KNEE ARTHROPLASTY WITH COMPUTER NAVIGATION;  Surgeon: Samson Frederic, MD;  Location: [facility] OR;  Service: Orthopedics;  Laterality: Left;  Needs RNFA   KNEE ARTHROPLASTY Right 11/17/2017   Procedure: RIGHT TOTAL KNEE ARTHROPLASTY WITH COMPUTER NAVIGATION;  Surgeon: Samson Frederic, MD;  Location: [facility];  Service: Orthopedics;  Laterality: Right;  NEEDS RNFA   REMOVAL OF A DIALYSIS CATHETER Right 09/07/2022   Procedure: MINOR REMOVAL OF A TUNNELED DIALYSIS CATHETER;  Surgeon: Larina Earthly, MD;  Location: [facility];  Service: Vascular;  Laterality: Right;  ROBOT ASSISTED LAPAROSCOPIC NEPHRECTOMY Right 05/16/2020   Procedure: XI ROBOTIC ASSISTED LAPAROSCOPIC NEPHRECTOMY;  Surgeon: Malen Gauze, MD;  Location: [facility];  Service: Urology;  Laterality: Right;  2.5 hrs     Social History   Tobacco Use   Smoking status: Former    Current packs/day: 0.00    Average packs/day: 0.5 packs/day for 20.0 years (10.0 ttl pk-yrs)    Types: Cigarettes    Start date: 02/12/1966    Quit date: 11/08/1984    Years since quitting: 38.8   Smokeless tobacco: Never  Vaping Use   Vaping status: Never Used  Substance Use Topics   Alcohol  use: No    Alcohol/week: 0.0 standard drinks of alcohol   Drug use: No     Family History  Problem Relation Age of Onset   Hypertension Mother    Kidney disease Mother    Heart disease Father    Kidney disease Other     No family history of bleeding or clotting disorders, autoimmune disease or porphyria  No Known Allergies   REVIEW OF SYSTEMS (Negative unless checked)  Constitutional: [] Weight loss  [] Fever  [] Chills Cardiac: [] Chest pain   [] Chest pressure   [x] Palpitations   [] Shortness of breath when laying flat   [] Shortness of breath at rest   [x] Shortness of breath with exertion. Vascular:  [] Pain in legs with walking   [] Pain in legs at rest   [] Pain in legs when laying flat   [] Claudication   [] Pain in feet when walking  [] Pain in feet at rest  [] Pain in feet when laying flat   [] History of DVT   [] Phlebitis   [x] Swelling in legs   [] Varicose veins   [] Non-healing ulcers Pulmonary:   [] Uses home oxygen   [] Productive cough   [] Hemoptysis   [] Wheeze  [] COPD   [] Asthma Neurologic:  [] Dizziness  [] Blackouts   [] Seizures   [] History of stroke   [] History of TIA  [] Aphasia   [] Temporary blindness   [] Dysphagia   [] Weakness or numbness in arms   [] Weakness or numbness in legs Musculoskeletal:  [] Arthritis   [] Joint swelling   [x] Joint pain   [] Low back pain Hematologic:  [] Easy bruising  [] Easy bleeding   [] Hypercoagulable state   [x] Anemic  [] Hepatitis Gastrointestinal:  [] Blood in stool   [] Vomiting blood  [] Gastroesophageal reflux/heartburn   [] Difficulty swallowing. Genitourinary:  [x] Chronic kidney disease   [] Difficult urination  [] Frequent urination  [] Burning with urination   [] Blood in urine Skin:  [] Rashes   [] Ulcers   [] Wounds Psychological:  [] History of anxiety   []  History of major depression.  Physical Examination  There were no vitals filed for this visit. There is no height or weight on file to calculate BMI. Gen: WD/WN, NAD Head: [place]/AT, No temporalis wasting.   Ear/Nose/Throat: Hearing grossly intact, nares w/o erythema or drainage, oropharynx w/o Erythema/Exudate,  Eyes: Conjunctiva clear, sclera non-icteric Neck: Trachea midline.  No JVD.  Pulmonary:  Good air movement, respirations not labored, no use of accessory muscles.  Cardiac: RRR, normal S1, S2. Vascular: No thrill in left upper arm AV graft Vessel Right Left  Radial Palpable Palpable   Musculoskeletal: M/S 5/5 throughout.  Extremities without ischemic changes.  No deformity or atrophy.  Neurologic: Sensation grossly intact in extremities.  Symmetrical.  Speech is fluent. Motor exam as listed above. Psychiatric: Judgment intact, Mood & affect appropriate for pt's clinical situation. Dermatologic: No rashes or ulcers noted.  No cellulitis or open wounds.  CBC Lab Results  Component Value Date   WBC 9.9 08/05/2023   HGB 10.9 (L) 08/05/2023   HCT 33.8 (L) 08/05/2023   MCV 102.4 (H) 08/05/2023   PLT 169 08/05/2023    BMET    Component Value Date/Time   NA 139 08/05/2023 1225   K 4.9 08/05/2023 1225   CL 98 08/05/2023 1225   CO2 23 08/05/2023 1225   GLUCOSE 84 08/05/2023 1225   BUN 78 (H) 08/05/2023 1225   CREATININE 13.23 (H) 08/05/2023 1225   CALCIUM 9.3 08/05/2023 1225   CALCIUM 8.2 (L) 05/21/2020 0336   GFRNONAA 4 (L) 08/05/2023 1225   GFRAA 5 (L) 06/24/2020 1304   CrCl cannot be calculated (Unknown ideal weight.).  COAG Lab Results  Component Value Date   INR 1.1 06/01/2020   INR 1.1 02/26/2020    Radiology IR THROMBECTOMY AV FISTULA W/THROMBOLYSIS/PTA INC/SHUNT/IMG LEFT  Result Date: 08/05/2023 INDICATION: 77 year old male referred for mechanical thrombectomy of nonfunctional left upper extremity dialysis circuit EXAM: ULTRASOUND-GUIDED ACCESS LEFT UPPER EXTREMITY DIALYSIS GRAFT X2 FISTULAGRAM MECHANICAL AND PHARMACOLOGIC THROMBECTOMY BALLOON ANGIOPLASTY OF OUTFLOW VENOUS STENOSIS WITH PTA AND DRUG-ELUTING BALLOON ANGIOPLASTY MEDICATIONS: 3 mg intra  arterial tPA 3000 units IV heparin ANESTHESIA/SEDATION: Moderate (conscious) sedation was employed during this procedure. A total of Versed 0.5 mg and Fentanyl 50 mcg was administered intravenously by the radiology nurse. Total intra-service moderate Sedation Time: 34 minutes. The patient's level of consciousness and vital signs were monitored continuously by radiology nursing throughout the procedure under my direct supervision. FLUOROSCOPY: Radiation Exposure Index (as provided by the fluoroscopic device): 24 mGy Kerma COMPLICATIONS: None PROCEDURE: The procedure, risks, benefits, and alternatives were explained to the patient and the patient's family, including bleeding, infection, arterial thrombus, venous thrombus, vessel injury, need for further procedure, need for stenting, contrast reaction, need for catheter placement, cardiopulmonary collapse, death. Questions regarding the procedure were encouraged and answered. The patient understands and consents to the procedure. Ultrasound survey was performed with images stored and sent to PACs. The left upper extremity was prepped and draped in the usual sterile fashion. Ultrasound guidance was used to access the upper extremity graft directed towards the venous outflow with a micropuncture kit. Wire was advanced. Small incision was made. Ultrasound-guided access was also used to puncture the graft directed towards the arterial anastomosis. Wire was advanced. Small incision was made. Exchange was then made at the centrally directed puncture for a 7 French short sheath, directed centrally. A Bentson wire was then a navigate into the venous outflow, with an angled catheter advanced into the venous outflow. Venogram of the left upper extremity demonstrates patency of the axillary vein, subclavian vein, and central vasculature. Upon withdrawal of the angled catheter, a solution of 3 milligrams of tPA in saline was infused into the outflow portion of the graft. Catheter  and the Bentson wire were then passed again centrally. The catheter was removed. A standard PTA balloon catheter was used to macerate the thrombosed segment, 6 mm x 40 mm. Balloon angioplasty of the stenotic venous segment was performed with 6 mm x 40 mm. We then placed the micropuncture kit onto the wire directed towards the arterial anastomosis. Once the micro sheath was in place a Glidewire was advanced, and then, an exchange was made for a 6 [place] short sheath. Glidewire was navigated across the arterial anastomosis, with an over the wire Fogarty balloon advanced into the artery. The arterial plug was. This restored flow. We then elected to treat the outflow  stenosis at the venous-graft anastomosis with 7 mm x 40 mm balloon angioplasty. A drug-eluting balloon was then deployed, 8 mm x 40 mm. 3 minute time interval was observed. Reflux image was performed to evaluate the arterial inflow. After 3 minutes the balloon was deflated, removed, and repeat fistulogram was then performed. No residual stenosis was identified in the venous outflow. No residual stenosis at the arterial anastomosis. The short sheaths were removed after placement of hemostasis sutures. Patient tolerated the procedure well and remained hemodynamically stable throughout. No complications were encountered and no significant blood loss was encountered. FINDINGS: Initial ultrasound images demonstrate complete thrombosis of the graft. A 90% or greater stenosis was identified at the venous outflow, at the site of the graft-vein anastomosis. This was treated with 7 mm balloon angioplasty and 8 mm drug-eluting balloon angioplasty with 0% residual. Restoration of flow achieved. No significant stenosis on the arterial anastomosis. Excellent thrill on completion. IMPRESSION: Status post ultrasound-guided access of left upper extremity dialysis graft for mechanical and pharmacologic thrombectomy, treatment of high-grade 90% stenosis at the venous outflow  to 0% residual, and restoration of flow and excellent thrill. Signed, Yvone Neu. Miachel Roux, RPVI [facility] Radiology ACCESS: This access remains amenable to future percutaneous interventions as clinically indicated. Electronically Signed   By: Gilmer Mor D.O.   On: 08/05/2023 14:54    Assessment/Plan 1.  Complication dialysis device with thrombosis AV access:  Patient's left arm dialysis access is thrombosed. The patient will undergo thrombectomy using interventional techniques.  The risks and benefits were described to the patient.  All questions were answered.  The patient agrees to proceed with angiography and intervention. Potassium will be drawn to ensure that it is an appropriate level prior to performing thrombectomy. 2.  End-stage renal disease requiring hemodialysis:  Patient will continue dialysis therapy without further interruption if a successful thrombectomy is not achieved then catheter will be placed. Dialysis has already been arranged since the patient missed their previous session 3.  Hypertension:  Patient will continue medical management; nephrology is following no changes in oral medications. 4. Diabetes mellitus:  Glucose will be monitored and oral medications been held this morning once the patient has undergone the patient's procedure po intake will be reinitiated and again Accu-Cheks will be used to assess the blood glucose level and treat as needed. The patient will be restarted on the patient's usual hypoglycemic regime 5.  Non-Ischemic cardiomyopathy: Poor cardiac function could be hampering the durability of his graft.    Festus Barren, MD  08/22/2023 2:17 PM

## 2023-08-23 ENCOUNTER — Encounter: Payer: Self-pay | Admitting: Vascular Surgery

## 2023-08-23 DIAGNOSIS — N186 End stage renal disease: Secondary | ICD-10-CM | POA: Diagnosis not present

## 2023-08-23 DIAGNOSIS — N2581 Secondary hyperparathyroidism of renal origin: Secondary | ICD-10-CM | POA: Diagnosis not present

## 2023-08-23 DIAGNOSIS — N250 Renal osteodystrophy: Secondary | ICD-10-CM | POA: Diagnosis not present

## 2023-08-23 DIAGNOSIS — D509 Iron deficiency anemia, unspecified: Secondary | ICD-10-CM | POA: Diagnosis not present

## 2023-08-23 DIAGNOSIS — D631 Anemia in chronic kidney disease: Secondary | ICD-10-CM | POA: Diagnosis not present

## 2023-08-23 DIAGNOSIS — Z992 Dependence on renal dialysis: Secondary | ICD-10-CM | POA: Diagnosis not present

## 2023-08-24 DIAGNOSIS — N2581 Secondary hyperparathyroidism of renal origin: Secondary | ICD-10-CM | POA: Diagnosis not present

## 2023-08-24 DIAGNOSIS — D631 Anemia in chronic kidney disease: Secondary | ICD-10-CM | POA: Diagnosis not present

## 2023-08-24 DIAGNOSIS — N186 End stage renal disease: Secondary | ICD-10-CM | POA: Diagnosis not present

## 2023-08-24 DIAGNOSIS — N250 Renal osteodystrophy: Secondary | ICD-10-CM | POA: Diagnosis not present

## 2023-08-24 DIAGNOSIS — D509 Iron deficiency anemia, unspecified: Secondary | ICD-10-CM | POA: Diagnosis not present

## 2023-08-24 DIAGNOSIS — Z992 Dependence on renal dialysis: Secondary | ICD-10-CM | POA: Diagnosis not present

## 2023-08-26 DIAGNOSIS — D631 Anemia in chronic kidney disease: Secondary | ICD-10-CM | POA: Diagnosis not present

## 2023-08-26 DIAGNOSIS — D509 Iron deficiency anemia, unspecified: Secondary | ICD-10-CM | POA: Diagnosis not present

## 2023-08-26 DIAGNOSIS — N186 End stage renal disease: Secondary | ICD-10-CM | POA: Diagnosis not present

## 2023-08-26 DIAGNOSIS — Z992 Dependence on renal dialysis: Secondary | ICD-10-CM | POA: Diagnosis not present

## 2023-08-26 DIAGNOSIS — N2581 Secondary hyperparathyroidism of renal origin: Secondary | ICD-10-CM | POA: Diagnosis not present

## 2023-08-26 DIAGNOSIS — N250 Renal osteodystrophy: Secondary | ICD-10-CM | POA: Diagnosis not present

## 2023-08-27 DIAGNOSIS — N186 End stage renal disease: Secondary | ICD-10-CM | POA: Diagnosis not present

## 2023-08-27 DIAGNOSIS — Z992 Dependence on renal dialysis: Secondary | ICD-10-CM | POA: Diagnosis not present

## 2023-08-27 DIAGNOSIS — E8779 Other fluid overload: Secondary | ICD-10-CM | POA: Diagnosis not present

## 2023-08-29 DIAGNOSIS — D509 Iron deficiency anemia, unspecified: Secondary | ICD-10-CM | POA: Diagnosis not present

## 2023-08-29 DIAGNOSIS — N186 End stage renal disease: Secondary | ICD-10-CM | POA: Diagnosis not present

## 2023-08-29 DIAGNOSIS — Z992 Dependence on renal dialysis: Secondary | ICD-10-CM | POA: Diagnosis not present

## 2023-08-29 DIAGNOSIS — N250 Renal osteodystrophy: Secondary | ICD-10-CM | POA: Diagnosis not present

## 2023-08-29 DIAGNOSIS — D631 Anemia in chronic kidney disease: Secondary | ICD-10-CM | POA: Diagnosis not present

## 2023-08-29 DIAGNOSIS — N2581 Secondary hyperparathyroidism of renal origin: Secondary | ICD-10-CM | POA: Diagnosis not present

## 2023-08-30 DIAGNOSIS — Z23 Encounter for immunization: Secondary | ICD-10-CM | POA: Diagnosis not present

## 2023-08-31 DIAGNOSIS — N2581 Secondary hyperparathyroidism of renal origin: Secondary | ICD-10-CM | POA: Diagnosis not present

## 2023-08-31 DIAGNOSIS — N250 Renal osteodystrophy: Secondary | ICD-10-CM | POA: Diagnosis not present

## 2023-08-31 DIAGNOSIS — Z992 Dependence on renal dialysis: Secondary | ICD-10-CM | POA: Diagnosis not present

## 2023-08-31 DIAGNOSIS — D509 Iron deficiency anemia, unspecified: Secondary | ICD-10-CM | POA: Diagnosis not present

## 2023-08-31 DIAGNOSIS — N186 End stage renal disease: Secondary | ICD-10-CM | POA: Diagnosis not present

## 2023-08-31 DIAGNOSIS — D631 Anemia in chronic kidney disease: Secondary | ICD-10-CM | POA: Diagnosis not present

## 2023-09-02 DIAGNOSIS — D631 Anemia in chronic kidney disease: Secondary | ICD-10-CM | POA: Diagnosis not present

## 2023-09-02 DIAGNOSIS — D509 Iron deficiency anemia, unspecified: Secondary | ICD-10-CM | POA: Diagnosis not present

## 2023-09-02 DIAGNOSIS — Z992 Dependence on renal dialysis: Secondary | ICD-10-CM | POA: Diagnosis not present

## 2023-09-02 DIAGNOSIS — N2581 Secondary hyperparathyroidism of renal origin: Secondary | ICD-10-CM | POA: Diagnosis not present

## 2023-09-02 DIAGNOSIS — N186 End stage renal disease: Secondary | ICD-10-CM | POA: Diagnosis not present

## 2023-09-02 DIAGNOSIS — N250 Renal osteodystrophy: Secondary | ICD-10-CM | POA: Diagnosis not present

## 2023-09-05 DIAGNOSIS — N186 End stage renal disease: Secondary | ICD-10-CM | POA: Diagnosis not present

## 2023-09-05 DIAGNOSIS — N2581 Secondary hyperparathyroidism of renal origin: Secondary | ICD-10-CM | POA: Diagnosis not present

## 2023-09-05 DIAGNOSIS — N250 Renal osteodystrophy: Secondary | ICD-10-CM | POA: Diagnosis not present

## 2023-09-05 DIAGNOSIS — Z992 Dependence on renal dialysis: Secondary | ICD-10-CM | POA: Diagnosis not present

## 2023-09-05 DIAGNOSIS — D509 Iron deficiency anemia, unspecified: Secondary | ICD-10-CM | POA: Diagnosis not present

## 2023-09-05 DIAGNOSIS — D631 Anemia in chronic kidney disease: Secondary | ICD-10-CM | POA: Diagnosis not present

## 2023-09-06 DIAGNOSIS — E1151 Type 2 diabetes mellitus with diabetic peripheral angiopathy without gangrene: Secondary | ICD-10-CM | POA: Diagnosis not present

## 2023-09-06 DIAGNOSIS — L110 Acquired keratosis follicularis: Secondary | ICD-10-CM | POA: Diagnosis not present

## 2023-09-06 DIAGNOSIS — B351 Tinea unguium: Secondary | ICD-10-CM | POA: Diagnosis not present

## 2023-09-06 DIAGNOSIS — E1140 Type 2 diabetes mellitus with diabetic neuropathy, unspecified: Secondary | ICD-10-CM | POA: Diagnosis not present

## 2023-09-07 DIAGNOSIS — N2581 Secondary hyperparathyroidism of renal origin: Secondary | ICD-10-CM | POA: Diagnosis not present

## 2023-09-07 DIAGNOSIS — N250 Renal osteodystrophy: Secondary | ICD-10-CM | POA: Diagnosis not present

## 2023-09-07 DIAGNOSIS — Z992 Dependence on renal dialysis: Secondary | ICD-10-CM | POA: Diagnosis not present

## 2023-09-07 DIAGNOSIS — D631 Anemia in chronic kidney disease: Secondary | ICD-10-CM | POA: Diagnosis not present

## 2023-09-07 DIAGNOSIS — N186 End stage renal disease: Secondary | ICD-10-CM | POA: Diagnosis not present

## 2023-09-07 DIAGNOSIS — D509 Iron deficiency anemia, unspecified: Secondary | ICD-10-CM | POA: Diagnosis not present

## 2023-09-08 DIAGNOSIS — N186 End stage renal disease: Secondary | ICD-10-CM | POA: Diagnosis not present

## 2023-09-08 DIAGNOSIS — Z992 Dependence on renal dialysis: Secondary | ICD-10-CM | POA: Diagnosis not present

## 2023-09-09 DIAGNOSIS — N186 End stage renal disease: Secondary | ICD-10-CM | POA: Diagnosis not present

## 2023-09-09 DIAGNOSIS — N250 Renal osteodystrophy: Secondary | ICD-10-CM | POA: Diagnosis not present

## 2023-09-09 DIAGNOSIS — Z992 Dependence on renal dialysis: Secondary | ICD-10-CM | POA: Diagnosis not present

## 2023-09-09 DIAGNOSIS — D509 Iron deficiency anemia, unspecified: Secondary | ICD-10-CM | POA: Diagnosis not present

## 2023-09-09 DIAGNOSIS — N2581 Secondary hyperparathyroidism of renal origin: Secondary | ICD-10-CM | POA: Diagnosis not present

## 2023-09-09 DIAGNOSIS — D631 Anemia in chronic kidney disease: Secondary | ICD-10-CM | POA: Diagnosis not present

## 2023-09-12 DIAGNOSIS — D509 Iron deficiency anemia, unspecified: Secondary | ICD-10-CM | POA: Diagnosis not present

## 2023-09-12 DIAGNOSIS — N250 Renal osteodystrophy: Secondary | ICD-10-CM | POA: Diagnosis not present

## 2023-09-12 DIAGNOSIS — D631 Anemia in chronic kidney disease: Secondary | ICD-10-CM | POA: Diagnosis not present

## 2023-09-12 DIAGNOSIS — N2581 Secondary hyperparathyroidism of renal origin: Secondary | ICD-10-CM | POA: Diagnosis not present

## 2023-09-12 DIAGNOSIS — N186 End stage renal disease: Secondary | ICD-10-CM | POA: Diagnosis not present

## 2023-09-12 DIAGNOSIS — Z992 Dependence on renal dialysis: Secondary | ICD-10-CM | POA: Diagnosis not present

## 2023-09-14 DIAGNOSIS — D509 Iron deficiency anemia, unspecified: Secondary | ICD-10-CM | POA: Diagnosis not present

## 2023-09-14 DIAGNOSIS — N186 End stage renal disease: Secondary | ICD-10-CM | POA: Diagnosis not present

## 2023-09-14 DIAGNOSIS — Z992 Dependence on renal dialysis: Secondary | ICD-10-CM | POA: Diagnosis not present

## 2023-09-14 DIAGNOSIS — D631 Anemia in chronic kidney disease: Secondary | ICD-10-CM | POA: Diagnosis not present

## 2023-09-14 DIAGNOSIS — N250 Renal osteodystrophy: Secondary | ICD-10-CM | POA: Diagnosis not present

## 2023-09-14 DIAGNOSIS — N2581 Secondary hyperparathyroidism of renal origin: Secondary | ICD-10-CM | POA: Diagnosis not present

## 2023-09-16 DIAGNOSIS — D509 Iron deficiency anemia, unspecified: Secondary | ICD-10-CM | POA: Diagnosis not present

## 2023-09-16 DIAGNOSIS — N186 End stage renal disease: Secondary | ICD-10-CM | POA: Diagnosis not present

## 2023-09-16 DIAGNOSIS — N2581 Secondary hyperparathyroidism of renal origin: Secondary | ICD-10-CM | POA: Diagnosis not present

## 2023-09-16 DIAGNOSIS — D631 Anemia in chronic kidney disease: Secondary | ICD-10-CM | POA: Diagnosis not present

## 2023-09-16 DIAGNOSIS — N250 Renal osteodystrophy: Secondary | ICD-10-CM | POA: Diagnosis not present

## 2023-09-16 DIAGNOSIS — Z992 Dependence on renal dialysis: Secondary | ICD-10-CM | POA: Diagnosis not present

## 2023-09-19 DIAGNOSIS — D509 Iron deficiency anemia, unspecified: Secondary | ICD-10-CM | POA: Diagnosis not present

## 2023-09-19 DIAGNOSIS — Z992 Dependence on renal dialysis: Secondary | ICD-10-CM | POA: Diagnosis not present

## 2023-09-19 DIAGNOSIS — D631 Anemia in chronic kidney disease: Secondary | ICD-10-CM | POA: Diagnosis not present

## 2023-09-19 DIAGNOSIS — N186 End stage renal disease: Secondary | ICD-10-CM | POA: Diagnosis not present

## 2023-09-19 DIAGNOSIS — N2581 Secondary hyperparathyroidism of renal origin: Secondary | ICD-10-CM | POA: Diagnosis not present

## 2023-09-19 DIAGNOSIS — N250 Renal osteodystrophy: Secondary | ICD-10-CM | POA: Diagnosis not present

## 2023-09-21 ENCOUNTER — Encounter: Payer: Self-pay | Admitting: Student

## 2023-09-21 ENCOUNTER — Ambulatory Visit: Payer: Medicare Other | Attending: Student | Admitting: Student

## 2023-09-21 VITALS — BP 126/62 | HR 90 | Ht 74.0 in | Wt 237.0 lb

## 2023-09-21 DIAGNOSIS — D631 Anemia in chronic kidney disease: Secondary | ICD-10-CM | POA: Diagnosis not present

## 2023-09-21 DIAGNOSIS — D509 Iron deficiency anemia, unspecified: Secondary | ICD-10-CM | POA: Diagnosis not present

## 2023-09-21 DIAGNOSIS — N186 End stage renal disease: Secondary | ICD-10-CM | POA: Diagnosis not present

## 2023-09-21 DIAGNOSIS — N2581 Secondary hyperparathyroidism of renal origin: Secondary | ICD-10-CM | POA: Diagnosis not present

## 2023-09-21 DIAGNOSIS — I428 Other cardiomyopathies: Secondary | ICD-10-CM | POA: Insufficient documentation

## 2023-09-21 DIAGNOSIS — I5032 Chronic diastolic (congestive) heart failure: Secondary | ICD-10-CM | POA: Diagnosis not present

## 2023-09-21 DIAGNOSIS — I493 Ventricular premature depolarization: Secondary | ICD-10-CM | POA: Diagnosis not present

## 2023-09-21 DIAGNOSIS — I959 Hypotension, unspecified: Secondary | ICD-10-CM | POA: Insufficient documentation

## 2023-09-21 DIAGNOSIS — Z992 Dependence on renal dialysis: Secondary | ICD-10-CM | POA: Diagnosis not present

## 2023-09-21 DIAGNOSIS — E782 Mixed hyperlipidemia: Secondary | ICD-10-CM | POA: Insufficient documentation

## 2023-09-21 DIAGNOSIS — N250 Renal osteodystrophy: Secondary | ICD-10-CM | POA: Diagnosis not present

## 2023-09-21 NOTE — Progress Notes (Signed)
Cardiology Office Note    Date:  09/21/2023  ID:  DRAYCE HOLLINGS, DOB 04-Dec-1945, MRN [id number] Cardiologist: Dina Rich, MD    History of Present Illness:    Todd Robertson is a 77 y.o. 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  He was examined by Dr. Wyline Mood in 05/2022 and denied any recent chest pain or dyspnea on exertion at that time. Did have occasional hypotension during dialysis and was on Midodrine to assist with this. Fluid management was by dialysis and no changes were made to his cardiac medications at that time. He was continued on Lopressor 12.5 mg twice daily and Midodrine 10 mg 3 times daily.  In talking with the patient and his wife today, he reports things have been stable since his last office visit. He is on hemodialysis and has this on a M/W/F schedule. He was previously having frequent issues with hypotension during his sessions and now takes Midodrine on a daily basis and also holds his Lopressor the days of dialysis. He is planning to undergo an additional session this week to have fluid removed. He reports feeling congested in his chest when he retains fluid. No specific orthopnea or PND.  No recent chest pain or palpitations. Does experience occasional lower extremity edema but typically isolated to his joints.  Studies Reviewed:   EKG: EKG is ordered today and demonstrates:   EKG Interpretation Date/Time:  Wednesday September 21 2023 15:02:39 EST Ventricular Rate:  90 PR Interval:  218 QRS Duration:  160 QT  Interval:  436 QTC Calculation: 533 R Axis:   -72  Text Interpretation: Sinus rhythm with sinus arrhythmia with 1st degree A-V block with frequent Premature ventricular complexes Left axis deviation Right bundle branch block Confirmed by Randall An ([id number]) on 09/21/2023 4:05:46 PM       Echocardiogram: 11/2020 IMPRESSIONS     1. Left ventricular ejection fraction, by estimation, is 50 to 55%. The  left ventricle has normal function. The left ventricle has no regional  wall motion abnormalities. There is moderate left ventricular hypertrophy.  Left ventricular diastolic  parameters are consistent with Grade I diastolic dysfunction (impaired  relaxation).   2. Right ventricular systolic function is normal. The right ventricular  size is normal.   3. Left atrial size was severely dilated.   4. Right atrial size was mildly dilated.   5. The pericardial effusion is circumferential.   6. The mitral valve is normal in structure. Trivial mitral valve  regurgitation. No evidence of mitral stenosis.   7. The aortic valve is tricuspid. Aortic valve regurgitation is mild. No  aortic stenosis is present.   8. The inferior vena cava is normal in size with greater than 50%  respiratory variability, suggesting right atrial pressure of 3 mmHg.   Comparison(s): Echocardiogram done 05/28/20/showed an EF of 45-50%.    Physical Exam:   VS:  BP 126/62   Pulse 90   Ht 6\' 2"  (1.88 m)   Wt 237 lb (107.5 kg)   SpO2 97%   BMI 30.43 kg/m    Wt Readings from Last  3 Encounters:  09/21/23 237 lb (107.5 kg)  08/05/23 230 lb (104.3 kg)  12/30/22 221 lb (100.2 kg)     GEN: Well nourished, well developed male appearing in no acute distress NECK: No JVD; No carotid bruits CARDIAC: RRR with occasional ectopic beats, no murmurs, rubs, gallops RESPIRATORY:  Clear to auscultation without rales, wheezing or rhonchi  ABDOMEN: Appears non-distended. No obvious abdominal masses. EXTREMITIES: No  clubbing or cyanosis. No pitting edema.  Distal pedal pulses are 2+ bilaterally.   Assessment and Plan:   1. HFimpEF/NICM - His EF was previously at 25 to 30% in 2004 and had normalized by repeat imaging, at 50 to 55% by most recent imaging in 2022. Volume managed by hemodialysis. He has remained on Lopressor 12.5 mg twice daily on nondialysis days. Not on an ACE-I/ARB/ARNI/MRA/SGLT2 inhibitor given his ESRD and BP has not allowed for Hydralazine/Nitrates.   2. Hypotension - This has been a recurrent issue since being on dialysis. Remains on Lopressor 12.5 mg twice daily on nondialysis days. He does take Midodrine 10 mg 3 times daily given hypotension.  3. HLD - Followed by his PCP. He has remained on Atorvastatin 40 mg daily and we will request a copy of his recent labs.  4. PVC's - Noted on his EKG tracing and this has been noted in the past as well. He denies any associated palpitations. He did not take his Lopressor today as he did undergo dialysis. Continue Lopressor 12.5 mg twice daily on nondialysis days. Unable to further titrate given hypotension during sessions. If he develops any associated symptoms or PVC's become more frequent, could arrange for a Zio patch to assess his PVC burden. He did have recent labs and we will request a copy of these.   5. ESRD - On HD - MWF schedule.   Signed, Ellsworth Lennox, PA-C

## 2023-09-21 NOTE — Patient Instructions (Signed)
Medication Instructions:  Your physician recommends that you continue on your current medications as directed. Please refer to the Current Medication list given to you today.   Labwork: None today  Testing/Procedures: None today  Follow-Up: 1 year Dr.Branch  Any Other Special Instructions Will Be Listed Below (If Applicable).  If you need a refill on your cardiac medications before your next appointment, please call your pharmacy.

## 2023-09-22 DIAGNOSIS — N186 End stage renal disease: Secondary | ICD-10-CM | POA: Diagnosis not present

## 2023-09-22 DIAGNOSIS — Z992 Dependence on renal dialysis: Secondary | ICD-10-CM | POA: Diagnosis not present

## 2023-09-22 DIAGNOSIS — E8779 Other fluid overload: Secondary | ICD-10-CM | POA: Diagnosis not present

## 2023-09-22 DIAGNOSIS — E8770 Fluid overload, unspecified: Secondary | ICD-10-CM | POA: Diagnosis not present

## 2023-09-23 DIAGNOSIS — D509 Iron deficiency anemia, unspecified: Secondary | ICD-10-CM | POA: Diagnosis not present

## 2023-09-23 DIAGNOSIS — Z992 Dependence on renal dialysis: Secondary | ICD-10-CM | POA: Diagnosis not present

## 2023-09-23 DIAGNOSIS — N250 Renal osteodystrophy: Secondary | ICD-10-CM | POA: Diagnosis not present

## 2023-09-23 DIAGNOSIS — D631 Anemia in chronic kidney disease: Secondary | ICD-10-CM | POA: Diagnosis not present

## 2023-09-23 DIAGNOSIS — N2581 Secondary hyperparathyroidism of renal origin: Secondary | ICD-10-CM | POA: Diagnosis not present

## 2023-09-23 DIAGNOSIS — N186 End stage renal disease: Secondary | ICD-10-CM | POA: Diagnosis not present

## 2023-09-26 DIAGNOSIS — Z992 Dependence on renal dialysis: Secondary | ICD-10-CM | POA: Diagnosis not present

## 2023-09-26 DIAGNOSIS — N186 End stage renal disease: Secondary | ICD-10-CM | POA: Diagnosis not present

## 2023-09-26 DIAGNOSIS — D631 Anemia in chronic kidney disease: Secondary | ICD-10-CM | POA: Diagnosis not present

## 2023-09-26 DIAGNOSIS — D509 Iron deficiency anemia, unspecified: Secondary | ICD-10-CM | POA: Diagnosis not present

## 2023-09-26 DIAGNOSIS — N250 Renal osteodystrophy: Secondary | ICD-10-CM | POA: Diagnosis not present

## 2023-09-26 DIAGNOSIS — N2581 Secondary hyperparathyroidism of renal origin: Secondary | ICD-10-CM | POA: Diagnosis not present

## 2023-09-28 DIAGNOSIS — D509 Iron deficiency anemia, unspecified: Secondary | ICD-10-CM | POA: Diagnosis not present

## 2023-09-28 DIAGNOSIS — N2581 Secondary hyperparathyroidism of renal origin: Secondary | ICD-10-CM | POA: Diagnosis not present

## 2023-09-28 DIAGNOSIS — D631 Anemia in chronic kidney disease: Secondary | ICD-10-CM | POA: Diagnosis not present

## 2023-09-28 DIAGNOSIS — Z992 Dependence on renal dialysis: Secondary | ICD-10-CM | POA: Diagnosis not present

## 2023-09-28 DIAGNOSIS — N250 Renal osteodystrophy: Secondary | ICD-10-CM | POA: Diagnosis not present

## 2023-09-28 DIAGNOSIS — N186 End stage renal disease: Secondary | ICD-10-CM | POA: Diagnosis not present

## 2023-09-30 DIAGNOSIS — Z992 Dependence on renal dialysis: Secondary | ICD-10-CM | POA: Diagnosis not present

## 2023-09-30 DIAGNOSIS — D509 Iron deficiency anemia, unspecified: Secondary | ICD-10-CM | POA: Diagnosis not present

## 2023-09-30 DIAGNOSIS — N186 End stage renal disease: Secondary | ICD-10-CM | POA: Diagnosis not present

## 2023-09-30 DIAGNOSIS — N2581 Secondary hyperparathyroidism of renal origin: Secondary | ICD-10-CM | POA: Diagnosis not present

## 2023-09-30 DIAGNOSIS — D631 Anemia in chronic kidney disease: Secondary | ICD-10-CM | POA: Diagnosis not present

## 2023-09-30 DIAGNOSIS — N250 Renal osteodystrophy: Secondary | ICD-10-CM | POA: Diagnosis not present

## 2023-10-03 DIAGNOSIS — N2581 Secondary hyperparathyroidism of renal origin: Secondary | ICD-10-CM | POA: Diagnosis not present

## 2023-10-03 DIAGNOSIS — D509 Iron deficiency anemia, unspecified: Secondary | ICD-10-CM | POA: Diagnosis not present

## 2023-10-03 DIAGNOSIS — N250 Renal osteodystrophy: Secondary | ICD-10-CM | POA: Diagnosis not present

## 2023-10-03 DIAGNOSIS — D631 Anemia in chronic kidney disease: Secondary | ICD-10-CM | POA: Diagnosis not present

## 2023-10-03 DIAGNOSIS — Z992 Dependence on renal dialysis: Secondary | ICD-10-CM | POA: Diagnosis not present

## 2023-10-03 DIAGNOSIS — N186 End stage renal disease: Secondary | ICD-10-CM | POA: Diagnosis not present

## 2023-10-04 DIAGNOSIS — J449 Chronic obstructive pulmonary disease, unspecified: Secondary | ICD-10-CM | POA: Diagnosis not present

## 2023-10-04 DIAGNOSIS — E041 Nontoxic single thyroid nodule: Secondary | ICD-10-CM | POA: Diagnosis not present

## 2023-10-04 DIAGNOSIS — I10 Essential (primary) hypertension: Secondary | ICD-10-CM | POA: Diagnosis not present

## 2023-10-04 DIAGNOSIS — Z299 Encounter for prophylactic measures, unspecified: Secondary | ICD-10-CM | POA: Diagnosis not present

## 2023-10-04 DIAGNOSIS — E1169 Type 2 diabetes mellitus with other specified complication: Secondary | ICD-10-CM | POA: Diagnosis not present

## 2023-10-05 DIAGNOSIS — Z992 Dependence on renal dialysis: Secondary | ICD-10-CM | POA: Diagnosis not present

## 2023-10-05 DIAGNOSIS — N250 Renal osteodystrophy: Secondary | ICD-10-CM | POA: Diagnosis not present

## 2023-10-05 DIAGNOSIS — D631 Anemia in chronic kidney disease: Secondary | ICD-10-CM | POA: Diagnosis not present

## 2023-10-05 DIAGNOSIS — D509 Iron deficiency anemia, unspecified: Secondary | ICD-10-CM | POA: Diagnosis not present

## 2023-10-05 DIAGNOSIS — N186 End stage renal disease: Secondary | ICD-10-CM | POA: Diagnosis not present

## 2023-10-05 DIAGNOSIS — N2581 Secondary hyperparathyroidism of renal origin: Secondary | ICD-10-CM | POA: Diagnosis not present

## 2023-10-07 DIAGNOSIS — N186 End stage renal disease: Secondary | ICD-10-CM | POA: Diagnosis not present

## 2023-10-07 DIAGNOSIS — N2581 Secondary hyperparathyroidism of renal origin: Secondary | ICD-10-CM | POA: Diagnosis not present

## 2023-10-07 DIAGNOSIS — N250 Renal osteodystrophy: Secondary | ICD-10-CM | POA: Diagnosis not present

## 2023-10-07 DIAGNOSIS — D509 Iron deficiency anemia, unspecified: Secondary | ICD-10-CM | POA: Diagnosis not present

## 2023-10-07 DIAGNOSIS — Z992 Dependence on renal dialysis: Secondary | ICD-10-CM | POA: Diagnosis not present

## 2023-10-07 DIAGNOSIS — D631 Anemia in chronic kidney disease: Secondary | ICD-10-CM | POA: Diagnosis not present

## 2023-10-08 DIAGNOSIS — Z992 Dependence on renal dialysis: Secondary | ICD-10-CM | POA: Diagnosis not present

## 2023-10-08 DIAGNOSIS — N186 End stage renal disease: Secondary | ICD-10-CM | POA: Diagnosis not present

## 2023-10-08 DIAGNOSIS — E8770 Fluid overload, unspecified: Secondary | ICD-10-CM | POA: Diagnosis not present

## 2023-10-08 DIAGNOSIS — E8779 Other fluid overload: Secondary | ICD-10-CM | POA: Diagnosis not present

## 2023-10-09 DIAGNOSIS — D509 Iron deficiency anemia, unspecified: Secondary | ICD-10-CM | POA: Diagnosis not present

## 2023-10-09 DIAGNOSIS — D631 Anemia in chronic kidney disease: Secondary | ICD-10-CM | POA: Diagnosis not present

## 2023-10-09 DIAGNOSIS — Z992 Dependence on renal dialysis: Secondary | ICD-10-CM | POA: Diagnosis not present

## 2023-10-09 DIAGNOSIS — N2581 Secondary hyperparathyroidism of renal origin: Secondary | ICD-10-CM | POA: Diagnosis not present

## 2023-10-09 DIAGNOSIS — N186 End stage renal disease: Secondary | ICD-10-CM | POA: Diagnosis not present

## 2023-10-09 DIAGNOSIS — N250 Renal osteodystrophy: Secondary | ICD-10-CM | POA: Diagnosis not present

## 2023-10-10 DIAGNOSIS — D509 Iron deficiency anemia, unspecified: Secondary | ICD-10-CM | POA: Diagnosis not present

## 2023-10-10 DIAGNOSIS — N186 End stage renal disease: Secondary | ICD-10-CM | POA: Diagnosis not present

## 2023-10-10 DIAGNOSIS — D631 Anemia in chronic kidney disease: Secondary | ICD-10-CM | POA: Diagnosis not present

## 2023-10-10 DIAGNOSIS — N2581 Secondary hyperparathyroidism of renal origin: Secondary | ICD-10-CM | POA: Diagnosis not present

## 2023-10-10 DIAGNOSIS — Z992 Dependence on renal dialysis: Secondary | ICD-10-CM | POA: Diagnosis not present

## 2023-10-12 DIAGNOSIS — N186 End stage renal disease: Secondary | ICD-10-CM | POA: Diagnosis not present

## 2023-10-12 DIAGNOSIS — D509 Iron deficiency anemia, unspecified: Secondary | ICD-10-CM | POA: Diagnosis not present

## 2023-10-12 DIAGNOSIS — D631 Anemia in chronic kidney disease: Secondary | ICD-10-CM | POA: Diagnosis not present

## 2023-10-12 DIAGNOSIS — N2581 Secondary hyperparathyroidism of renal origin: Secondary | ICD-10-CM | POA: Diagnosis not present

## 2023-10-12 DIAGNOSIS — Z992 Dependence on renal dialysis: Secondary | ICD-10-CM | POA: Diagnosis not present

## 2023-10-14 DIAGNOSIS — D509 Iron deficiency anemia, unspecified: Secondary | ICD-10-CM | POA: Diagnosis not present

## 2023-10-14 DIAGNOSIS — Z992 Dependence on renal dialysis: Secondary | ICD-10-CM | POA: Diagnosis not present

## 2023-10-14 DIAGNOSIS — N186 End stage renal disease: Secondary | ICD-10-CM | POA: Diagnosis not present

## 2023-10-14 DIAGNOSIS — N2581 Secondary hyperparathyroidism of renal origin: Secondary | ICD-10-CM | POA: Diagnosis not present

## 2023-10-14 DIAGNOSIS — D631 Anemia in chronic kidney disease: Secondary | ICD-10-CM | POA: Diagnosis not present

## 2023-10-17 ENCOUNTER — Other Ambulatory Visit: Payer: Self-pay | Admitting: Cardiology

## 2023-10-17 DIAGNOSIS — N2581 Secondary hyperparathyroidism of renal origin: Secondary | ICD-10-CM | POA: Diagnosis not present

## 2023-10-17 DIAGNOSIS — Z992 Dependence on renal dialysis: Secondary | ICD-10-CM | POA: Diagnosis not present

## 2023-10-17 DIAGNOSIS — D631 Anemia in chronic kidney disease: Secondary | ICD-10-CM | POA: Diagnosis not present

## 2023-10-17 DIAGNOSIS — N186 End stage renal disease: Secondary | ICD-10-CM | POA: Diagnosis not present

## 2023-10-17 DIAGNOSIS — D509 Iron deficiency anemia, unspecified: Secondary | ICD-10-CM | POA: Diagnosis not present

## 2023-10-19 DIAGNOSIS — N2581 Secondary hyperparathyroidism of renal origin: Secondary | ICD-10-CM | POA: Diagnosis not present

## 2023-10-19 DIAGNOSIS — D631 Anemia in chronic kidney disease: Secondary | ICD-10-CM | POA: Diagnosis not present

## 2023-10-19 DIAGNOSIS — N186 End stage renal disease: Secondary | ICD-10-CM | POA: Diagnosis not present

## 2023-10-19 DIAGNOSIS — Z992 Dependence on renal dialysis: Secondary | ICD-10-CM | POA: Diagnosis not present

## 2023-10-19 DIAGNOSIS — D509 Iron deficiency anemia, unspecified: Secondary | ICD-10-CM | POA: Diagnosis not present

## 2023-10-21 DIAGNOSIS — N186 End stage renal disease: Secondary | ICD-10-CM | POA: Diagnosis not present

## 2023-10-21 DIAGNOSIS — Z992 Dependence on renal dialysis: Secondary | ICD-10-CM | POA: Diagnosis not present

## 2023-10-21 DIAGNOSIS — N2581 Secondary hyperparathyroidism of renal origin: Secondary | ICD-10-CM | POA: Diagnosis not present

## 2023-10-21 DIAGNOSIS — D509 Iron deficiency anemia, unspecified: Secondary | ICD-10-CM | POA: Diagnosis not present

## 2023-10-21 DIAGNOSIS — D631 Anemia in chronic kidney disease: Secondary | ICD-10-CM | POA: Diagnosis not present

## 2023-10-24 DIAGNOSIS — D631 Anemia in chronic kidney disease: Secondary | ICD-10-CM | POA: Diagnosis not present

## 2023-10-24 DIAGNOSIS — N2581 Secondary hyperparathyroidism of renal origin: Secondary | ICD-10-CM | POA: Diagnosis not present

## 2023-10-24 DIAGNOSIS — N186 End stage renal disease: Secondary | ICD-10-CM | POA: Diagnosis not present

## 2023-10-24 DIAGNOSIS — D509 Iron deficiency anemia, unspecified: Secondary | ICD-10-CM | POA: Diagnosis not present

## 2023-10-24 DIAGNOSIS — Z992 Dependence on renal dialysis: Secondary | ICD-10-CM | POA: Diagnosis not present

## 2023-10-26 DIAGNOSIS — N2581 Secondary hyperparathyroidism of renal origin: Secondary | ICD-10-CM | POA: Diagnosis not present

## 2023-10-26 DIAGNOSIS — D631 Anemia in chronic kidney disease: Secondary | ICD-10-CM | POA: Diagnosis not present

## 2023-10-26 DIAGNOSIS — Z992 Dependence on renal dialysis: Secondary | ICD-10-CM | POA: Diagnosis not present

## 2023-10-26 DIAGNOSIS — N186 End stage renal disease: Secondary | ICD-10-CM | POA: Diagnosis not present

## 2023-10-26 DIAGNOSIS — D509 Iron deficiency anemia, unspecified: Secondary | ICD-10-CM | POA: Diagnosis not present

## 2023-10-27 DIAGNOSIS — I10 Essential (primary) hypertension: Secondary | ICD-10-CM | POA: Diagnosis not present

## 2023-10-27 DIAGNOSIS — Z79899 Other long term (current) drug therapy: Secondary | ICD-10-CM | POA: Diagnosis not present

## 2023-10-27 DIAGNOSIS — Z1331 Encounter for screening for depression: Secondary | ICD-10-CM | POA: Diagnosis not present

## 2023-10-27 DIAGNOSIS — E7800 Pure hypercholesterolemia, unspecified: Secondary | ICD-10-CM | POA: Diagnosis not present

## 2023-10-27 DIAGNOSIS — Z125 Encounter for screening for malignant neoplasm of prostate: Secondary | ICD-10-CM | POA: Diagnosis not present

## 2023-10-27 DIAGNOSIS — Z0000 Encounter for general adult medical examination without abnormal findings: Secondary | ICD-10-CM | POA: Diagnosis not present

## 2023-10-27 DIAGNOSIS — Z7189 Other specified counseling: Secondary | ICD-10-CM | POA: Diagnosis not present

## 2023-10-27 DIAGNOSIS — R5383 Other fatigue: Secondary | ICD-10-CM | POA: Diagnosis not present

## 2023-10-27 DIAGNOSIS — E041 Nontoxic single thyroid nodule: Secondary | ICD-10-CM | POA: Diagnosis not present

## 2023-10-27 DIAGNOSIS — Z1339 Encounter for screening examination for other mental health and behavioral disorders: Secondary | ICD-10-CM | POA: Diagnosis not present

## 2023-10-27 DIAGNOSIS — Z299 Encounter for prophylactic measures, unspecified: Secondary | ICD-10-CM | POA: Diagnosis not present

## 2023-10-27 LAB — TSH: TSH: 0.53 (ref 0.41–5.90)

## 2023-10-28 DIAGNOSIS — Z992 Dependence on renal dialysis: Secondary | ICD-10-CM | POA: Diagnosis not present

## 2023-10-28 DIAGNOSIS — D509 Iron deficiency anemia, unspecified: Secondary | ICD-10-CM | POA: Diagnosis not present

## 2023-10-28 DIAGNOSIS — N2581 Secondary hyperparathyroidism of renal origin: Secondary | ICD-10-CM | POA: Diagnosis not present

## 2023-10-28 DIAGNOSIS — D631 Anemia in chronic kidney disease: Secondary | ICD-10-CM | POA: Diagnosis not present

## 2023-10-28 DIAGNOSIS — N186 End stage renal disease: Secondary | ICD-10-CM | POA: Diagnosis not present

## 2023-10-31 DIAGNOSIS — D509 Iron deficiency anemia, unspecified: Secondary | ICD-10-CM | POA: Diagnosis not present

## 2023-10-31 DIAGNOSIS — N186 End stage renal disease: Secondary | ICD-10-CM | POA: Diagnosis not present

## 2023-10-31 DIAGNOSIS — N2581 Secondary hyperparathyroidism of renal origin: Secondary | ICD-10-CM | POA: Diagnosis not present

## 2023-10-31 DIAGNOSIS — Z992 Dependence on renal dialysis: Secondary | ICD-10-CM | POA: Diagnosis not present

## 2023-10-31 DIAGNOSIS — D631 Anemia in chronic kidney disease: Secondary | ICD-10-CM | POA: Diagnosis not present

## 2023-11-03 DIAGNOSIS — D631 Anemia in chronic kidney disease: Secondary | ICD-10-CM | POA: Diagnosis not present

## 2023-11-03 DIAGNOSIS — Z992 Dependence on renal dialysis: Secondary | ICD-10-CM | POA: Diagnosis not present

## 2023-11-03 DIAGNOSIS — D509 Iron deficiency anemia, unspecified: Secondary | ICD-10-CM | POA: Diagnosis not present

## 2023-11-03 DIAGNOSIS — N2581 Secondary hyperparathyroidism of renal origin: Secondary | ICD-10-CM | POA: Diagnosis not present

## 2023-11-03 DIAGNOSIS — N186 End stage renal disease: Secondary | ICD-10-CM | POA: Diagnosis not present

## 2023-11-04 DIAGNOSIS — D631 Anemia in chronic kidney disease: Secondary | ICD-10-CM | POA: Diagnosis not present

## 2023-11-04 DIAGNOSIS — D509 Iron deficiency anemia, unspecified: Secondary | ICD-10-CM | POA: Diagnosis not present

## 2023-11-04 DIAGNOSIS — Z794 Long term (current) use of insulin: Secondary | ICD-10-CM | POA: Diagnosis not present

## 2023-11-04 DIAGNOSIS — N2581 Secondary hyperparathyroidism of renal origin: Secondary | ICD-10-CM | POA: Diagnosis not present

## 2023-11-04 DIAGNOSIS — Z992 Dependence on renal dialysis: Secondary | ICD-10-CM | POA: Diagnosis not present

## 2023-11-04 DIAGNOSIS — E119 Type 2 diabetes mellitus without complications: Secondary | ICD-10-CM | POA: Diagnosis not present

## 2023-11-04 DIAGNOSIS — N186 End stage renal disease: Secondary | ICD-10-CM | POA: Diagnosis not present

## 2023-11-07 DIAGNOSIS — D631 Anemia in chronic kidney disease: Secondary | ICD-10-CM | POA: Diagnosis not present

## 2023-11-07 DIAGNOSIS — Z992 Dependence on renal dialysis: Secondary | ICD-10-CM | POA: Diagnosis not present

## 2023-11-07 DIAGNOSIS — D509 Iron deficiency anemia, unspecified: Secondary | ICD-10-CM | POA: Diagnosis not present

## 2023-11-07 DIAGNOSIS — N186 End stage renal disease: Secondary | ICD-10-CM | POA: Diagnosis not present

## 2023-11-07 DIAGNOSIS — N2581 Secondary hyperparathyroidism of renal origin: Secondary | ICD-10-CM | POA: Diagnosis not present

## 2023-11-08 DIAGNOSIS — Z992 Dependence on renal dialysis: Secondary | ICD-10-CM | POA: Diagnosis not present

## 2023-11-08 DIAGNOSIS — N186 End stage renal disease: Secondary | ICD-10-CM | POA: Diagnosis not present

## 2023-11-09 DIAGNOSIS — N250 Renal osteodystrophy: Secondary | ICD-10-CM | POA: Diagnosis not present

## 2023-11-09 DIAGNOSIS — N2581 Secondary hyperparathyroidism of renal origin: Secondary | ICD-10-CM | POA: Diagnosis not present

## 2023-11-09 DIAGNOSIS — D509 Iron deficiency anemia, unspecified: Secondary | ICD-10-CM | POA: Diagnosis not present

## 2023-11-09 DIAGNOSIS — N186 End stage renal disease: Secondary | ICD-10-CM | POA: Diagnosis not present

## 2023-11-09 DIAGNOSIS — D631 Anemia in chronic kidney disease: Secondary | ICD-10-CM | POA: Diagnosis not present

## 2023-11-09 DIAGNOSIS — Z992 Dependence on renal dialysis: Secondary | ICD-10-CM | POA: Diagnosis not present

## 2023-11-10 DIAGNOSIS — Z992 Dependence on renal dialysis: Secondary | ICD-10-CM | POA: Diagnosis not present

## 2023-11-10 DIAGNOSIS — E8779 Other fluid overload: Secondary | ICD-10-CM | POA: Diagnosis not present

## 2023-11-10 DIAGNOSIS — N186 End stage renal disease: Secondary | ICD-10-CM | POA: Diagnosis not present

## 2023-11-11 DIAGNOSIS — N186 End stage renal disease: Secondary | ICD-10-CM | POA: Diagnosis not present

## 2023-11-11 DIAGNOSIS — Z992 Dependence on renal dialysis: Secondary | ICD-10-CM | POA: Diagnosis not present

## 2023-11-11 DIAGNOSIS — N2581 Secondary hyperparathyroidism of renal origin: Secondary | ICD-10-CM | POA: Diagnosis not present

## 2023-11-11 DIAGNOSIS — N250 Renal osteodystrophy: Secondary | ICD-10-CM | POA: Diagnosis not present

## 2023-11-11 DIAGNOSIS — D631 Anemia in chronic kidney disease: Secondary | ICD-10-CM | POA: Diagnosis not present

## 2023-11-11 DIAGNOSIS — D509 Iron deficiency anemia, unspecified: Secondary | ICD-10-CM | POA: Diagnosis not present

## 2023-11-14 DIAGNOSIS — Z992 Dependence on renal dialysis: Secondary | ICD-10-CM | POA: Diagnosis not present

## 2023-11-14 DIAGNOSIS — D631 Anemia in chronic kidney disease: Secondary | ICD-10-CM | POA: Diagnosis not present

## 2023-11-14 DIAGNOSIS — N2581 Secondary hyperparathyroidism of renal origin: Secondary | ICD-10-CM | POA: Diagnosis not present

## 2023-11-14 DIAGNOSIS — N250 Renal osteodystrophy: Secondary | ICD-10-CM | POA: Diagnosis not present

## 2023-11-14 DIAGNOSIS — N186 End stage renal disease: Secondary | ICD-10-CM | POA: Diagnosis not present

## 2023-11-14 DIAGNOSIS — D509 Iron deficiency anemia, unspecified: Secondary | ICD-10-CM | POA: Diagnosis not present

## 2023-11-15 DIAGNOSIS — B351 Tinea unguium: Secondary | ICD-10-CM | POA: Diagnosis not present

## 2023-11-15 DIAGNOSIS — L110 Acquired keratosis follicularis: Secondary | ICD-10-CM | POA: Diagnosis not present

## 2023-11-15 DIAGNOSIS — E1140 Type 2 diabetes mellitus with diabetic neuropathy, unspecified: Secondary | ICD-10-CM | POA: Diagnosis not present

## 2023-11-15 DIAGNOSIS — E1151 Type 2 diabetes mellitus with diabetic peripheral angiopathy without gangrene: Secondary | ICD-10-CM | POA: Diagnosis not present

## 2023-11-16 DIAGNOSIS — N2581 Secondary hyperparathyroidism of renal origin: Secondary | ICD-10-CM | POA: Diagnosis not present

## 2023-11-16 DIAGNOSIS — N186 End stage renal disease: Secondary | ICD-10-CM | POA: Diagnosis not present

## 2023-11-16 DIAGNOSIS — D631 Anemia in chronic kidney disease: Secondary | ICD-10-CM | POA: Diagnosis not present

## 2023-11-16 DIAGNOSIS — N250 Renal osteodystrophy: Secondary | ICD-10-CM | POA: Diagnosis not present

## 2023-11-16 DIAGNOSIS — Z992 Dependence on renal dialysis: Secondary | ICD-10-CM | POA: Diagnosis not present

## 2023-11-16 DIAGNOSIS — D509 Iron deficiency anemia, unspecified: Secondary | ICD-10-CM | POA: Diagnosis not present

## 2023-11-18 DIAGNOSIS — N2581 Secondary hyperparathyroidism of renal origin: Secondary | ICD-10-CM | POA: Diagnosis not present

## 2023-11-18 DIAGNOSIS — Z992 Dependence on renal dialysis: Secondary | ICD-10-CM | POA: Diagnosis not present

## 2023-11-18 DIAGNOSIS — N250 Renal osteodystrophy: Secondary | ICD-10-CM | POA: Diagnosis not present

## 2023-11-18 DIAGNOSIS — D631 Anemia in chronic kidney disease: Secondary | ICD-10-CM | POA: Diagnosis not present

## 2023-11-18 DIAGNOSIS — D509 Iron deficiency anemia, unspecified: Secondary | ICD-10-CM | POA: Diagnosis not present

## 2023-11-18 DIAGNOSIS — N186 End stage renal disease: Secondary | ICD-10-CM | POA: Diagnosis not present

## 2023-11-21 DIAGNOSIS — N250 Renal osteodystrophy: Secondary | ICD-10-CM | POA: Diagnosis not present

## 2023-11-21 DIAGNOSIS — D631 Anemia in chronic kidney disease: Secondary | ICD-10-CM | POA: Diagnosis not present

## 2023-11-21 DIAGNOSIS — N2581 Secondary hyperparathyroidism of renal origin: Secondary | ICD-10-CM | POA: Diagnosis not present

## 2023-11-21 DIAGNOSIS — D509 Iron deficiency anemia, unspecified: Secondary | ICD-10-CM | POA: Diagnosis not present

## 2023-11-21 DIAGNOSIS — N186 End stage renal disease: Secondary | ICD-10-CM | POA: Diagnosis not present

## 2023-11-21 DIAGNOSIS — Z992 Dependence on renal dialysis: Secondary | ICD-10-CM | POA: Diagnosis not present

## 2023-11-23 DIAGNOSIS — N186 End stage renal disease: Secondary | ICD-10-CM | POA: Diagnosis not present

## 2023-11-23 DIAGNOSIS — N250 Renal osteodystrophy: Secondary | ICD-10-CM | POA: Diagnosis not present

## 2023-11-23 DIAGNOSIS — N2581 Secondary hyperparathyroidism of renal origin: Secondary | ICD-10-CM | POA: Diagnosis not present

## 2023-11-23 DIAGNOSIS — Z992 Dependence on renal dialysis: Secondary | ICD-10-CM | POA: Diagnosis not present

## 2023-11-23 DIAGNOSIS — D509 Iron deficiency anemia, unspecified: Secondary | ICD-10-CM | POA: Diagnosis not present

## 2023-11-23 DIAGNOSIS — D631 Anemia in chronic kidney disease: Secondary | ICD-10-CM | POA: Diagnosis not present

## 2023-11-24 DIAGNOSIS — N186 End stage renal disease: Secondary | ICD-10-CM | POA: Diagnosis not present

## 2023-11-24 DIAGNOSIS — E8779 Other fluid overload: Secondary | ICD-10-CM | POA: Diagnosis not present

## 2023-11-24 DIAGNOSIS — Z992 Dependence on renal dialysis: Secondary | ICD-10-CM | POA: Diagnosis not present

## 2023-11-25 DIAGNOSIS — D631 Anemia in chronic kidney disease: Secondary | ICD-10-CM | POA: Diagnosis not present

## 2023-11-25 DIAGNOSIS — D509 Iron deficiency anemia, unspecified: Secondary | ICD-10-CM | POA: Diagnosis not present

## 2023-11-25 DIAGNOSIS — N186 End stage renal disease: Secondary | ICD-10-CM | POA: Diagnosis not present

## 2023-11-25 DIAGNOSIS — Z992 Dependence on renal dialysis: Secondary | ICD-10-CM | POA: Diagnosis not present

## 2023-11-25 DIAGNOSIS — N2581 Secondary hyperparathyroidism of renal origin: Secondary | ICD-10-CM | POA: Diagnosis not present

## 2023-11-25 DIAGNOSIS — N250 Renal osteodystrophy: Secondary | ICD-10-CM | POA: Diagnosis not present

## 2023-11-28 DIAGNOSIS — N2581 Secondary hyperparathyroidism of renal origin: Secondary | ICD-10-CM | POA: Diagnosis not present

## 2023-11-28 DIAGNOSIS — N250 Renal osteodystrophy: Secondary | ICD-10-CM | POA: Diagnosis not present

## 2023-11-28 DIAGNOSIS — D631 Anemia in chronic kidney disease: Secondary | ICD-10-CM | POA: Diagnosis not present

## 2023-11-28 DIAGNOSIS — D509 Iron deficiency anemia, unspecified: Secondary | ICD-10-CM | POA: Diagnosis not present

## 2023-11-28 DIAGNOSIS — N186 End stage renal disease: Secondary | ICD-10-CM | POA: Diagnosis not present

## 2023-11-28 DIAGNOSIS — Z992 Dependence on renal dialysis: Secondary | ICD-10-CM | POA: Diagnosis not present

## 2023-11-30 DIAGNOSIS — N250 Renal osteodystrophy: Secondary | ICD-10-CM | POA: Diagnosis not present

## 2023-11-30 DIAGNOSIS — Z992 Dependence on renal dialysis: Secondary | ICD-10-CM | POA: Diagnosis not present

## 2023-11-30 DIAGNOSIS — D631 Anemia in chronic kidney disease: Secondary | ICD-10-CM | POA: Diagnosis not present

## 2023-11-30 DIAGNOSIS — D509 Iron deficiency anemia, unspecified: Secondary | ICD-10-CM | POA: Diagnosis not present

## 2023-11-30 DIAGNOSIS — N2581 Secondary hyperparathyroidism of renal origin: Secondary | ICD-10-CM | POA: Diagnosis not present

## 2023-11-30 DIAGNOSIS — N186 End stage renal disease: Secondary | ICD-10-CM | POA: Diagnosis not present

## 2023-12-02 DIAGNOSIS — D509 Iron deficiency anemia, unspecified: Secondary | ICD-10-CM | POA: Diagnosis not present

## 2023-12-02 DIAGNOSIS — N2581 Secondary hyperparathyroidism of renal origin: Secondary | ICD-10-CM | POA: Diagnosis not present

## 2023-12-02 DIAGNOSIS — N250 Renal osteodystrophy: Secondary | ICD-10-CM | POA: Diagnosis not present

## 2023-12-02 DIAGNOSIS — D631 Anemia in chronic kidney disease: Secondary | ICD-10-CM | POA: Diagnosis not present

## 2023-12-02 DIAGNOSIS — N186 End stage renal disease: Secondary | ICD-10-CM | POA: Diagnosis not present

## 2023-12-02 DIAGNOSIS — Z992 Dependence on renal dialysis: Secondary | ICD-10-CM | POA: Diagnosis not present

## 2023-12-05 DIAGNOSIS — N250 Renal osteodystrophy: Secondary | ICD-10-CM | POA: Diagnosis not present

## 2023-12-05 DIAGNOSIS — D509 Iron deficiency anemia, unspecified: Secondary | ICD-10-CM | POA: Diagnosis not present

## 2023-12-05 DIAGNOSIS — Z992 Dependence on renal dialysis: Secondary | ICD-10-CM | POA: Diagnosis not present

## 2023-12-05 DIAGNOSIS — N2581 Secondary hyperparathyroidism of renal origin: Secondary | ICD-10-CM | POA: Diagnosis not present

## 2023-12-05 DIAGNOSIS — N186 End stage renal disease: Secondary | ICD-10-CM | POA: Diagnosis not present

## 2023-12-05 DIAGNOSIS — D631 Anemia in chronic kidney disease: Secondary | ICD-10-CM | POA: Diagnosis not present

## 2023-12-07 DIAGNOSIS — N250 Renal osteodystrophy: Secondary | ICD-10-CM | POA: Diagnosis not present

## 2023-12-07 DIAGNOSIS — Z992 Dependence on renal dialysis: Secondary | ICD-10-CM | POA: Diagnosis not present

## 2023-12-07 DIAGNOSIS — D631 Anemia in chronic kidney disease: Secondary | ICD-10-CM | POA: Diagnosis not present

## 2023-12-07 DIAGNOSIS — N186 End stage renal disease: Secondary | ICD-10-CM | POA: Diagnosis not present

## 2023-12-07 DIAGNOSIS — D509 Iron deficiency anemia, unspecified: Secondary | ICD-10-CM | POA: Diagnosis not present

## 2023-12-07 DIAGNOSIS — N2581 Secondary hyperparathyroidism of renal origin: Secondary | ICD-10-CM | POA: Diagnosis not present

## 2023-12-09 DIAGNOSIS — N2581 Secondary hyperparathyroidism of renal origin: Secondary | ICD-10-CM | POA: Diagnosis not present

## 2023-12-09 DIAGNOSIS — N186 End stage renal disease: Secondary | ICD-10-CM | POA: Diagnosis not present

## 2023-12-09 DIAGNOSIS — N250 Renal osteodystrophy: Secondary | ICD-10-CM | POA: Diagnosis not present

## 2023-12-09 DIAGNOSIS — Z992 Dependence on renal dialysis: Secondary | ICD-10-CM | POA: Diagnosis not present

## 2023-12-09 DIAGNOSIS — D509 Iron deficiency anemia, unspecified: Secondary | ICD-10-CM | POA: Diagnosis not present

## 2023-12-09 DIAGNOSIS — D631 Anemia in chronic kidney disease: Secondary | ICD-10-CM | POA: Diagnosis not present

## 2023-12-12 DIAGNOSIS — N250 Renal osteodystrophy: Secondary | ICD-10-CM | POA: Diagnosis not present

## 2023-12-12 DIAGNOSIS — D509 Iron deficiency anemia, unspecified: Secondary | ICD-10-CM | POA: Diagnosis not present

## 2023-12-12 DIAGNOSIS — N186 End stage renal disease: Secondary | ICD-10-CM | POA: Diagnosis not present

## 2023-12-12 DIAGNOSIS — N2581 Secondary hyperparathyroidism of renal origin: Secondary | ICD-10-CM | POA: Diagnosis not present

## 2023-12-12 DIAGNOSIS — Z992 Dependence on renal dialysis: Secondary | ICD-10-CM | POA: Diagnosis not present

## 2023-12-12 DIAGNOSIS — D631 Anemia in chronic kidney disease: Secondary | ICD-10-CM | POA: Diagnosis not present

## 2023-12-14 DIAGNOSIS — D509 Iron deficiency anemia, unspecified: Secondary | ICD-10-CM | POA: Diagnosis not present

## 2023-12-14 DIAGNOSIS — Z992 Dependence on renal dialysis: Secondary | ICD-10-CM | POA: Diagnosis not present

## 2023-12-14 DIAGNOSIS — N2581 Secondary hyperparathyroidism of renal origin: Secondary | ICD-10-CM | POA: Diagnosis not present

## 2023-12-14 DIAGNOSIS — D631 Anemia in chronic kidney disease: Secondary | ICD-10-CM | POA: Diagnosis not present

## 2023-12-14 DIAGNOSIS — N186 End stage renal disease: Secondary | ICD-10-CM | POA: Diagnosis not present

## 2023-12-14 DIAGNOSIS — N250 Renal osteodystrophy: Secondary | ICD-10-CM | POA: Diagnosis not present

## 2023-12-15 DIAGNOSIS — D631 Anemia in chronic kidney disease: Secondary | ICD-10-CM | POA: Diagnosis not present

## 2023-12-15 DIAGNOSIS — N186 End stage renal disease: Secondary | ICD-10-CM | POA: Diagnosis not present

## 2023-12-15 DIAGNOSIS — N250 Renal osteodystrophy: Secondary | ICD-10-CM | POA: Diagnosis not present

## 2023-12-15 DIAGNOSIS — N2581 Secondary hyperparathyroidism of renal origin: Secondary | ICD-10-CM | POA: Diagnosis not present

## 2023-12-15 DIAGNOSIS — Z992 Dependence on renal dialysis: Secondary | ICD-10-CM | POA: Diagnosis not present

## 2023-12-15 DIAGNOSIS — D509 Iron deficiency anemia, unspecified: Secondary | ICD-10-CM | POA: Diagnosis not present

## 2023-12-16 DIAGNOSIS — D631 Anemia in chronic kidney disease: Secondary | ICD-10-CM | POA: Diagnosis not present

## 2023-12-16 DIAGNOSIS — Z992 Dependence on renal dialysis: Secondary | ICD-10-CM | POA: Diagnosis not present

## 2023-12-16 DIAGNOSIS — N2581 Secondary hyperparathyroidism of renal origin: Secondary | ICD-10-CM | POA: Diagnosis not present

## 2023-12-16 DIAGNOSIS — N186 End stage renal disease: Secondary | ICD-10-CM | POA: Diagnosis not present

## 2023-12-16 DIAGNOSIS — D509 Iron deficiency anemia, unspecified: Secondary | ICD-10-CM | POA: Diagnosis not present

## 2023-12-16 DIAGNOSIS — N250 Renal osteodystrophy: Secondary | ICD-10-CM | POA: Diagnosis not present

## 2023-12-16 NOTE — Patient Instructions (Signed)
 Thyroid Nodule  A thyroid nodule is an isolated growth of thyroid cells that forms a lump in the thyroid gland. The thyroid gland is a butterfly-shaped gland found in the lower front of the neck. It sends chemical messengers (hormones) through the blood to all parts of the body. These hormones are important in regulating body temperature and helping the body use energy. Thyroid nodules are common. Most are not cancerous (are benign). You may have one nodule or several nodules. There are different types of thyroid nodules. They include nodules that: Grow and fill with fluid (thyroid cysts). Produce too much thyroid hormone (hot nodules or hyperthyroid). Produce no thyroid hormone (cold nodules or hypothyroid). Form from cancer cells (thyroid cancers). What are the causes? In most cases, the cause of thyroid nodules is not known. What increases the risk? The following factors may make you more likely to develop thyroid nodules: Age. Thyroid nodules are more common in people who are older than 45 years. Male gender. A family history that includes: Thyroid nodules. Pheochromocytoma. Thyroid carcinoma. Hyperparathyroidism. Certain thyroid diseases, such as Hashimoto's thyroiditis. Lack of iodine in your diet. A history of head and neck radiation, such as from cancer treatments. Type 2 diabetes. What are the signs or symptoms? In many cases, there are no symptoms. If you have symptoms, they may include: A lump in your lower neck. Feeling pressure, fullness, or a tickle in your throat. Pain in your neck, jaw, or ear. Having trouble swallowing or breathing. Hot nodules may cause: Weight loss. Warm, flushed skin. Feeling hot. Feeling nervous. A rapid or irregular heartbeat. Cold nodules may cause: Weight gain. Dry skin. Hair loss, brittle hair, or both. Feeling cold. Fatigue. Thyroid cancer nodules may cause: Hard nodules that can be felt along the thyroid  gland. Hoarseness. Lumps in the tissue (lymph nodes) near your thyroid gland. How is this diagnosed? A thyroid nodule may be felt by your health care provider during a physical exam. This condition may also be diagnosed based on your symptoms. You may also have tests, including: Blood tests to check how well your thyroid is working. An ultrasound. This may be done to confirm the diagnosis. A biopsy. This involves taking a sample from the nodule and looking at it under a microscope. A thyroid scan. This test creates an image of the thyroid gland using a radioactive tracer. Imaging tests such as an MRI or CT scan. These may be done if: A nodule is large. A nodule is blocking your airway. Cancer is suspected. How is this treated? Treatment depends on the cause and size of your nodule or nodules. If a nodule is benign, treatment may not be necessary. Your health care provider may monitor the nodule to see if it goes away without treatment. If a nodule continues to grow, is cancerous, or does not go away, treatment may be needed. Treatment may include: Having a cystic nodule drained with a needle. Ablation therapy. In this treatment, alcohol is injected into the area of the nodule to destroy the cells. Ablation with heat may also be used. This is called thermal ablation. Radioactive iodine. In this treatment, radioactive iodine is given as a pill or liquid that you drink. This substance causes the thyroid nodule to shrink. Surgery to remove the nodule or nodules. Part or all of your thyroid gland may also need to be removed. Medicines to treat hyperthyroidism. Follow these instructions at home: Pay attention to any changes in your thyroid nodule or nodules. Take over-the-counter  and prescription medicines only as told by your health care provider. Keep all follow-up visits. This is important. Contact a health care provider if: You have trouble sleeping. You have muscle weakness. You have  significant weight loss without changing your eating habits. You feel nervous. You have trouble swallowing. You have increased swelling. You have a rapid or irregular heartbeat. Get help right away if: You have chest pain. You faint or lose consciousness. Your nodule makes it hard for you to breathe. These symptoms may be an emergency. Get help right away. Call 911. Do not wait to see if the symptoms will go away. Do not drive yourself to the hospital. Summary A thyroid nodule is an isolated growth of thyroid cells that forms a lump in your thyroid gland. Thyroid nodules are common. Most are not cancerous. Your health care provider may monitor the nodule to see if it goes away without treatment. If a nodule continues to grow, is cancerous, or does not go away, treatment may be needed. Treatment depends on the cause and size of your nodule or nodules. This information is not intended to replace advice given to you by your health care provider. Make sure you discuss any questions you have with your health care provider. Document Revised: 09/07/2021 Document Reviewed: 09/07/2021 Elsevier Patient Education  2024 [employer].

## 2023-12-19 ENCOUNTER — Other Ambulatory Visit: Payer: Medicare Other

## 2023-12-19 ENCOUNTER — Ambulatory Visit (INDEPENDENT_AMBULATORY_CARE_PROVIDER_SITE_OTHER): Payer: Medicare Other | Admitting: Nurse Practitioner

## 2023-12-19 ENCOUNTER — Encounter: Payer: Self-pay | Admitting: Nurse Practitioner

## 2023-12-19 VITALS — BP 104/68 | HR 86 | Ht 74.0 in | Wt 254.2 lb

## 2023-12-19 DIAGNOSIS — Z992 Dependence on renal dialysis: Secondary | ICD-10-CM | POA: Diagnosis not present

## 2023-12-19 DIAGNOSIS — D631 Anemia in chronic kidney disease: Secondary | ICD-10-CM | POA: Diagnosis not present

## 2023-12-19 DIAGNOSIS — R9721 Rising PSA following treatment for malignant neoplasm of prostate: Secondary | ICD-10-CM

## 2023-12-19 DIAGNOSIS — Z8546 Personal history of malignant neoplasm of prostate: Secondary | ICD-10-CM

## 2023-12-19 DIAGNOSIS — D509 Iron deficiency anemia, unspecified: Secondary | ICD-10-CM | POA: Diagnosis not present

## 2023-12-19 DIAGNOSIS — N2581 Secondary hyperparathyroidism of renal origin: Secondary | ICD-10-CM | POA: Diagnosis not present

## 2023-12-19 DIAGNOSIS — N250 Renal osteodystrophy: Secondary | ICD-10-CM | POA: Diagnosis not present

## 2023-12-19 DIAGNOSIS — N186 End stage renal disease: Secondary | ICD-10-CM | POA: Diagnosis not present

## 2023-12-19 DIAGNOSIS — E042 Nontoxic multinodular goiter: Secondary | ICD-10-CM | POA: Diagnosis not present

## 2023-12-19 NOTE — Progress Notes (Signed)
 Todd Silvas, MD  Todd Robertson PROCEDURE / BIOPSY REVIEW Date: 12/19/23  Requested Biopsy site: R lobe x2 Reason for request: IMPRESSION: Nodules 1 and 2 located within the right thyroid  lobe, meet criteria for FNA.  The above is in keeping with the ACR TI-RADS recommendations - J Am Coll Radiol 2017;14:587-595.   Electronically Signed By: Elester Grim M.D. On: 07/07/2023 12:18 Imaging review: Best seen on US  07/07/23  Decision: Approved Imaging modality to perform: Ultrasound Schedule with: No sedation / Local anesthetic Schedule for: Any VIR  Additional comments: @Schedulers . TYPICALLY THESE ARE DONE AT [facility]  Please contact me with questions, concerns, or if issue pertaining to this request arise.  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

## 2023-12-19 NOTE — Progress Notes (Signed)
 Endocrinology Consult Note 12/19/23    ---------------------------------------------------------------------------------------------------------------------- Subjective    Past Medical History:  Diagnosis Date   Arthritis    Cancer (HCC)    prostate   Cardiomyopathy    secondary   CKD (chronic kidney disease)    DM2 (diabetes mellitus, type 2) (HCC)    Gout    HTN (hypertension)    unspec   Hypercholesterolemia    Nonischemic cardiomyopathy (HCC)    a. EF 40-50% by most recent 2-D echo b. EF 25% by cardiac cath in 2004   Overweight(278.02)     Past Surgical History:  Procedure Laterality Date   AV FISTULA PLACEMENT Left 06/06/2020   Procedure: LEFT ARM ARTERIOVENOUS (AV) GRAFT USING 45CM GORTEX;  Surgeon: Mayo Speck, MD;  Location: [facility];  Service: Vascular;  Laterality: Left;   AV FISTULA PLACEMENT Left 06/08/2022   Procedure: INSERTION OF ARTERIOVENOUS GORE-TEX GRAFT UPPER ARM;  Surgeon: Mayo Speck, MD;  Location: [facility];  Service: Vascular;  Laterality: Left;   AVGG REMOVAL Left 07/27/2022   Procedure: EXCISION OF INFECTED PORTION OF LEFT FOREARM ARTERIOVENOUS GORETEX GRAFT (AVGG);  Surgeon: Mayo Speck, MD;  Location: [facility];  Service: Vascular;  Laterality: Left;   COLONOSCOPY     HERNIA REPAIR     INSERTION OF DIALYSIS CATHETER Right 06/06/2020   Procedure: INSERTION OF DIALYSIS CATHETER USING 23CM DOUBLE LUMEN CATHETER;  Surgeon: Mayo Speck, MD;  Location: [facility];  Service: Vascular;  Laterality: Right;   IR ANGIOGRAM EXTREMITY LEFT  10/25/2022   IR FLUORO GUIDE CV LINE RIGHT  05/20/2020   IR THROMBECTOMY AV FISTULA W/THROMBOLYSIS/PTA INC/SHUNT/IMG LEFT Left 10/25/2022   IR THROMBECTOMY AV FISTULA W/THROMBOLYSIS/PTA INC/SHUNT/IMG LEFT Left 08/05/2023   IR US  GUIDE VASC ACCESS LEFT  10/25/2022   IR US  GUIDE VASC ACCESS LEFT  08/09/2023   IR US  GUIDE VASC ACCESS RIGHT  05/20/2020   KNEE ARTHROPLASTY Left 08/08/2017   Procedure: LEFT TOTAL KNEE ARTHROPLASTY WITH  COMPUTER NAVIGATION;  Surgeon: Adonica Hoose, MD;  Location: [facility];  Service: Orthopedics;  Laterality: Left;  Needs RNFA   KNEE ARTHROPLASTY Right 11/17/2017   Procedure: RIGHT TOTAL KNEE ARTHROPLASTY WITH COMPUTER NAVIGATION;  Surgeon: Adonica Hoose, MD;  Location: [facility];  Service: Orthopedics;  Laterality: Right;  NEEDS RNFA   PERIPHERAL VASCULAR THROMBECTOMY Left 08/22/2023   Procedure: PERIPHERAL VASCULAR THROMBECTOMY;  Surgeon: Celso College, MD;  Location: [facility];  Service: Cardiovascular;  Laterality: Left;   REMOVAL OF A DIALYSIS CATHETER Right 09/07/2022   Procedure: MINOR REMOVAL OF A TUNNELED DIALYSIS CATHETER;  Surgeon: Mayo Speck, MD;  Location: [facility];  Service: Vascular;  Laterality: Right;   ROBOT ASSISTED LAPAROSCOPIC NEPHRECTOMY Right 05/16/2020   Procedure: XI ROBOTIC ASSISTED LAPAROSCOPIC NEPHRECTOMY;  Surgeon: Marco Severs, MD;  Location: [facility];  Service: Urology;  Laterality: Right;  2.5 hrs    Social History   Socioeconomic History   Marital status: Married    Spouse name: Not on file   Number of children: 2   Years of education: Not on file   Highest education level: Not on file  Occupational History   Occupation: retired  Tobacco Use   Smoking status: Former    Current packs/day: 0.00    Average packs/day: 0.5 packs/day for 20.0 years (10.0 ttl pk-yrs)    Types: Cigarettes    Start date: 02/12/1966    Quit date: 11/08/1984    Years since quitting: 39.1  Smokeless tobacco: Never  Vaping Use   Vaping status: Not on file  Substance and Sexual Activity   Alcohol  use: No    Alcohol /week: 0.0 standard drinks of alcohol    Drug use: No   Sexual activity: Yes    Birth control/protection: None  Other Topics Concern   Not on file  Social History Narrative   Full time.    Social Drivers of Corporate investment banker Strain: Not on file  Food Insecurity: No Food Insecurity (12/04/2020)   Received from [facility], [facility]    Hunger Vital Sign    Worried About Running Out of Food in the Last Year: Never true    Ran Out of Food in the Last Year: Never true  Transportation Needs: Not on file  Physical Activity: Not on file  Stress: No Stress Concern Present (01/27/2021)   Received from [facility], [facility] - Occupational Stress Questionnaire    Feeling of Stress : Only a little  Social Connections: Unknown (03/23/2022)   Received from [facility], [facility]   Social Network    Social Network: Not on file  Intimate Partner Violence: Unknown (02/12/2022)   Received from [facility], [facility]   HITS    Physically Hurt: Not on file    Insult or Talk Down To: Not on file    Threaten Physical Harm: Not on file    Scream or Curse: Not on file    Current Outpatient Medications on File Prior to Visit  Medication Sig Dispense Refill   acetaminophen  (TYLENOL ) 500 MG tablet Take 500-1,000 mg by mouth every 6 (six) hours as needed for moderate pain.     albuterol  (VENTOLIN  HFA) 108 (90 Base) MCG/ACT inhaler Inhale 1-2 puffs into the lungs every 6 (six) hours as needed for wheezing or shortness of breath. (Patient taking differently: Inhale 1-2 puffs into the lungs 2 (two) times daily.) 6.7 g 1   allopurinol  (ZYLOPRIM ) 300 MG tablet Take 300 mg by mouth daily.     atorvastatin  (LIPITOR) 40 MG tablet Take 1 tablet (40 mg total) by mouth daily. (Patient taking differently: Take 40 mg by mouth at bedtime.) 90 tablet 3   b complex-vitamin c-folic acid  (NEPHRO-VITE) 0.8 MG TABS tablet Take 1 tablet by mouth at bedtime.     BREO ELLIPTA  100-25 MCG/ACT AEPB Inhale 1 puff into the lungs daily.     calcium  acetate (PHOSLO ) 667 MG capsule Take 3 capsules (2,001 mg total) by mouth 3 (three) times daily with meals. 180 capsule 1   cetirizine (ZYRTEC) 10 MG tablet Take 10 mg by mouth daily.     Cholecalciferol (VITAMIN D ) 50 MCG (2000 UT) tablet Take 1 tablet (2,000 Units  total) by mouth daily. 30 tablet 0   methocarbamol  (ROBAXIN ) 500 MG tablet Take 500 mg by mouth 3 (three) times a week. Take Sun, Tue, and Thurs (day before dialysis)     metoprolol  tartrate (LOPRESSOR ) 25 MG tablet Take 0.5 tablets (12.5 mg total) by mouth 2 (two) times daily. (Patient taking differently: Take 12.5 mg by mouth 2 (two) times daily. after dialysis) 90 tablet 3   midodrine  (PROAMATINE ) 10 MG tablet TAKE 1 TABLET BY MOUTH THREE TIMES DAILY WITH MEALS 270 tablet 3   Multiple Vitamins-Minerals (CENTRUM SILVER 50+MEN) TABS Take 1 tablet by mouth daily.     Naphazoline HCl (CLEAR EYES OP) Place 1 drop into both eyes daily.  Polyethylene Glycol 3350  (MIRALAX  PO) Take 17 g by mouth daily as needed (constipation).     senna (SENOKOT) 8.6 MG tablet Take 1-2 tablets by mouth daily as needed for constipation.     tamsulosin  (FLOMAX ) 0.4 MG CAPS capsule Take 1 capsule (0.4 mg total) by mouth daily. 90 capsule 3   trolamine salicylate (ASPERCREME) 10 % cream Apply 1 Application topically as needed for muscle pain.     No current facility-administered medications on file prior to visit.      HPI   Todd Robertson is a 78 y.o.-year-old male, referred by his PCP, Dr. Mason Sole, for evaluation for multinodular goiter.  He is accompanied by his wife today.  Thyroid  U/S: 07/07/23 Nodules 1 and 2 located within the right thyroid  lobe, meet criteria for FNA.  The above is in keeping with the ACR TI-RADS recommendations - J Am Coll Radiol 2017;14:587-595.   Electronically Signed   By: Elester Grim M.D.   On: 07/07/2023 12:18 Narrative  CLINICAL DATA:  Thyromegaly  EXAM: THYROID  ULTRASOUND  TECHNIQUE: Ultrasound examination of the thyroid  gland and adjacent soft tissues was performed.  COMPARISON:  None available.  FINDINGS: Parenchymal Echotexture: Mildly heterogenous  Isthmus: 1.0 cm  Right lobe: 6.4 x 4.2 x 5.5 cm  Left lobe: 5.7 x 1.8 x 2.7  cm  _________________________________________________________  Estimated total number of nodules >/= 1 cm: 2  Number of spongiform nodules >/=  2 cm not described below (TR1): 0  Number of mixed cystic and solid nodules >/= 1.5 cm not described below (TR2): 0  _________________________________________________________  Nodule # 1:  Location: Right; superior  Maximum size: 3.1 cm; Other 2 dimensions: 1.2 x 2.3 cm  Composition: solid/almost completely solid (2)  Echogenicity: isoechoic (1)  Shape: not taller-than-wide (0)  Margins: ill-defined (0)  Echogenic foci: none (0)  ACR TI-RADS total points: 3.  ACR TI-RADS risk category: TR3 (3 points).  ACR TI-RADS recommendations:  **Given size (>/= 2.5 cm) and appearance, fine needle aspiration of this mildly suspicious nodule should be considered based on TI-RADS criteria.  _________________________________________________________  Nodule # 2:  Location: Right; mid  Maximum size: 4.7 cm; Other 2 dimensions: 2.6 x 4.0 cm  Composition: solid/almost completely solid (2)  Echogenicity: isoechoic (1)  Shape: not taller-than-wide (0)  Margins: ill-defined (0)  Echogenic foci: none (0)  ACR TI-RADS total points: 3.  ACR TI-RADS risk category: TR3 (3 points).  ACR TI-RADS recommendations:  **Given size (>/= 2.5 cm) and appearance, fine needle aspiration of this mildly suspicious nodule should be considered based on TI-RADS criteria.  _________________________________________________________  Nodule 3: 0.9 x 0.7 x 0.9 cm predominantly cystic left superior thyroid  nodule does not meet criteria for imaging surveillance or FNA. Procedure Note  Mir, Carylon Claude, MD - 07/07/2023 Formatting of this note might be different from the original. CLINICAL DATA:  Thyromegaly  EXAM: THYROID  ULTRASOUND  TECHNIQUE: Ultrasound examination of the thyroid  gland and adjacent soft tissues was  performed.  COMPARISON:  None available.  FINDINGS: Parenchymal Echotexture: Mildly heterogenous  Isthmus: 1.0 cm  Right lobe: 6.4 x 4.2 x 5.5 cm  Left lobe: 5.7 x 1.8 x 2.7 cm  _________________________________________________________  Estimated total number of nodules >/= 1 cm: 2  Number of spongiform nodules >/=  2 cm not described below (TR1): 0  Number of mixed cystic and solid nodules >/= 1.5 cm not described below (TR2): 0  _________________________________________________________  Nodule # 1:  Location: Right; superior  Maximum  size: 3.1 cm; Other 2 dimensions: 1.2 x 2.3 cm  Composition: solid/almost completely solid (2)  Echogenicity: isoechoic (1)  Shape: not taller-than-wide (0)  Margins: ill-defined (0)  Echogenic foci: none (0)  ACR TI-RADS total points: 3.  ACR TI-RADS risk category: TR3 (3 points).  ACR TI-RADS recommendations:  **Given size (>/= 2.5 cm) and appearance, fine needle aspiration of this mildly suspicious nodule should be considered based on TI-RADS criteria.  _________________________________________________________  Nodule # 2:  Location: Right; mid  Maximum size: 4.7 cm; Other 2 dimensions: 2.6 x 4.0 cm  Composition: solid/almost completely solid (2)  Echogenicity: isoechoic (1)  Shape: not taller-than-wide (0)  Margins: ill-defined (0)  Echogenic foci: none (0)  ACR TI-RADS total points: 3.  ACR TI-RADS risk category: TR3 (3 points).  ACR TI-RADS recommendations:  **Given size (>/= 2.5 cm) and appearance, fine needle aspiration of this mildly suspicious nodule should be considered based on TI-RADS criteria.  _________________________________________________________  Nodule 3: 0.9 x 0.7 x 0.9 cm predominantly cystic left superior thyroid  nodule does not meet criteria for imaging surveillance or FNA.  IMPRESSION: Nodules 1 and 2 located within the right thyroid  lobe, meet criteria for FNA.  The above  is in keeping with the ACR TI-RADS recommendations - J Am Coll Radiol 2017;14:587-595.   Electronically Signed   By: Elester Grim M.D.   On: 07/07/2023 12:18 Exam End: 07/07/23 11:57   Specimen Collected: 07/07/23 12:13 Last Resulted: 07/07/23 12:18  Received From: [facility]  Result Received: 07/12/23 07:20    I reviewed pt's thyroid  tests: Lab Results  Component Value Date   TSH 0.53 10/27/2023     Pt c/o: - fatigue - SOB with exertion - hoarseness  He does have FH of thyroid  disease in his sister (hypothyroidism). No FH of thyroid  cancer. No h/o radiation tx to head or neck.  He did have radiation treatment for prostate cancer in the past.  No seaweed or kelp. No recent contrast studies. No steroid use. No herbal supplements. No Biotin supplements or Hair, Skin and Nails vitamins.  Pt also has a history of cardiomyopathy, Afib, DM2, OA, ESRD, renal cell carcinoma, anemia, COPD.  Review of systems  Constitutional: + Minimally fluctuating body weight,  current Body mass index is 32.64 kg/m. , + fatigue, no subjective hyperthermia, no subjective hypothermia Eyes: no blurry vision, no xerophthalmia ENT: no sore throat, no nodules palpated in throat, no dysphagia/odynophagia, + hoarseness Cardiovascular: no chest pain, + shortness of breath-Hx cardiomyopathy, no palpitations, no leg swelling, has HD access in left arm Respiratory: no cough, + shortness of breath (hx COPD) Gastrointestinal: no nausea/vomiting/diarrhea Musculoskeletal: no muscle/joint aches, walks with cane Skin: no rashes, no hyperemia Neurological: no tremors, no numbness, no tingling, no dizziness Psychiatric: no depression, no anxiety  ---------------------------------------------------------------------------------------------------------------------- Objective    BP 104/68 (BP Location: Right Arm, Patient Position: Sitting)   Pulse 86   Ht 6\' 2"  (1.88 m)   Wt 254 lb 3.2 oz (115.3 kg)   BMI  32.64 kg/m    BP Readings from Last 3 Encounters:  12/19/23 104/68  09/21/23 126/62  08/22/23 136/84    Wt Readings from Last 3 Encounters:  12/19/23 254 lb 3.2 oz (115.3 kg)  09/21/23 237 lb (107.5 kg)  08/05/23 230 lb (104.3 kg)     Physical Exam- Limited  Constitutional:  Body mass index is 32.64 kg/m. , not in acute distress, normal state of mind Eyes:  EOMI, no exophthalmos Neck: Supple, submandibular  lymph nodes enlarged Thyroid : + gross goiter R>L Cardiovascular: + irregular rhythm (hx afib), no murmurs, rubs, or gallops, no edema Respiratory: Adequate breathing efforts, no crackles, rales, rhonchi, + wheezing in RLL, + congested cough Musculoskeletal: no gross deformities, strength intact in all four extremities, no gross restriction of joint movements Skin:  no rashes, no hyperemia Neurological: slight tremor with left hand only (this is his HD access arm)   ----------------------------------------------------------------------------------------------------------------------  ASSESSMENT / PLAN:  1. Thyroid  Nodule  - I reviewed the images of his thyroid  ultrasound along with the patient. I pointed out that the dominant nodules are large, this being a risk factor for cancer.  Otherwise, the nodules are: - not hypoechoic - without microcalcifications - without internal blood flow - more wide than tall - well delimited from surrounding tissue  Pt does not have a thyroid  cancer family history or a personal history of RxTx to head/neck. All these would favor benignity.  - the only way that we can tell exactly if it is cancer or not is by doing a thyroid  biopsy (FNA). I explained what the test entails. - We discussed about other options, to wait for another 6 months to a year and see if the nodule grows, and only intervene at that time.   - patient decided to have the FNA done now >> I ordered this.   Will also more comprehensive thyroid  panel to assess for thyroid   dysfunction given his family history of hypothyroidism.  - I explained that this is not cancer, we can continue to follow him on a yearly basis, and check another ultrasound in another year or 2. - he should let me know if he develops neck compression symptoms, in that case, we might need to do either lobectomy or thyroidectomy  - I did explain that, while thyroid  surgery is not a complicated one, it still can have side effects and also he might have a risk of ~25% of becoming hypothyroid after hemithyroidectomy.    Follow Up Plan: Return in about 6 weeks (around 01/30/2024) for Thyroid  follow up, Previsit labs, FNA biopsy.    I spent 60 minutes in the care of the patient today including review of labs from Thyroid  Function, CMP, and other relevant labs ; imaging/biopsy records (current and previous including abstractions from other facilities); face-to-face time discussing  his lab results and symptoms, medications doses, his options of short and long term treatment based on the latest standards of care / guidelines;   and documenting the encounter.  Todd Robertson  participated in the discussions, expressed understanding, and voiced agreement with the above plans.  All questions were answered to his satisfaction. he is encouraged to contact clinic should he have any questions or concerns prior to his return visit.    Hulon Magic, [facility], [place] [facility] [address] [place], [place] [address] Phone: [phone] Fax: [phone]

## 2023-12-20 ENCOUNTER — Other Ambulatory Visit: Payer: Self-pay | Admitting: Nurse Practitioner

## 2023-12-20 ENCOUNTER — Other Ambulatory Visit: Payer: Medicare Other

## 2023-12-20 DIAGNOSIS — E042 Nontoxic multinodular goiter: Secondary | ICD-10-CM

## 2023-12-20 LAB — PSA: Prostate Specific Ag, Serum: 0.8 ng/mL (ref 0.0–4.0)

## 2023-12-21 DIAGNOSIS — N2581 Secondary hyperparathyroidism of renal origin: Secondary | ICD-10-CM | POA: Diagnosis not present

## 2023-12-21 DIAGNOSIS — D509 Iron deficiency anemia, unspecified: Secondary | ICD-10-CM | POA: Diagnosis not present

## 2023-12-21 DIAGNOSIS — N250 Renal osteodystrophy: Secondary | ICD-10-CM | POA: Diagnosis not present

## 2023-12-21 DIAGNOSIS — D631 Anemia in chronic kidney disease: Secondary | ICD-10-CM | POA: Diagnosis not present

## 2023-12-21 DIAGNOSIS — Z992 Dependence on renal dialysis: Secondary | ICD-10-CM | POA: Diagnosis not present

## 2023-12-21 DIAGNOSIS — N186 End stage renal disease: Secondary | ICD-10-CM | POA: Diagnosis not present

## 2023-12-23 DIAGNOSIS — N186 End stage renal disease: Secondary | ICD-10-CM | POA: Diagnosis not present

## 2023-12-23 DIAGNOSIS — D509 Iron deficiency anemia, unspecified: Secondary | ICD-10-CM | POA: Diagnosis not present

## 2023-12-23 DIAGNOSIS — N2581 Secondary hyperparathyroidism of renal origin: Secondary | ICD-10-CM | POA: Diagnosis not present

## 2023-12-23 DIAGNOSIS — N250 Renal osteodystrophy: Secondary | ICD-10-CM | POA: Diagnosis not present

## 2023-12-23 DIAGNOSIS — Z992 Dependence on renal dialysis: Secondary | ICD-10-CM | POA: Diagnosis not present

## 2023-12-23 DIAGNOSIS — D631 Anemia in chronic kidney disease: Secondary | ICD-10-CM | POA: Diagnosis not present

## 2023-12-26 ENCOUNTER — Telehealth: Payer: Self-pay | Admitting: *Deleted

## 2023-12-26 DIAGNOSIS — N2581 Secondary hyperparathyroidism of renal origin: Secondary | ICD-10-CM | POA: Diagnosis not present

## 2023-12-26 DIAGNOSIS — N250 Renal osteodystrophy: Secondary | ICD-10-CM | POA: Diagnosis not present

## 2023-12-26 DIAGNOSIS — D631 Anemia in chronic kidney disease: Secondary | ICD-10-CM | POA: Diagnosis not present

## 2023-12-26 DIAGNOSIS — N186 End stage renal disease: Secondary | ICD-10-CM | POA: Diagnosis not present

## 2023-12-26 DIAGNOSIS — Z992 Dependence on renal dialysis: Secondary | ICD-10-CM | POA: Diagnosis not present

## 2023-12-26 DIAGNOSIS — D509 Iron deficiency anemia, unspecified: Secondary | ICD-10-CM | POA: Diagnosis not present

## 2023-12-26 NOTE — Telephone Encounter (Signed)
Patient's wife had called and left a message last week. She shared that the patient did not want to go to [facility] to have this procedure done, because it would require his daughter to take time off from her job. I called [employer] to share this information.  The patient has dialysis on Monday, Wednesday, and Friday. These are the same days that [facility] do the biopsies and their times are 9 am , 10 am, 11 am . The patient has to be at dialysis at 5;45 am. His wife was called and made aware. She said that they would talk with the lead person at the Dialysis Center and felt sure they would change his time so that he could go to [name] for Dialysis.  This morning another message was left. The person at  Dialysis has ask for the exact day and time the biopsy will be. I called the patient's wife and provided her the phone number for [employer] to call and see if they could work on this.

## 2023-12-27 ENCOUNTER — Ambulatory Visit (HOSPITAL_COMMUNITY)
Admission: RE | Admit: 2023-12-27 | Discharge: 2023-12-27 | Disposition: A | Discharge status: Home / Self Care | Payer: Medicare Other | Source: Ambulatory Visit | Attending: Urology | Admitting: Urology

## 2023-12-27 DIAGNOSIS — Z85528 Personal history of other malignant neoplasm of kidney: Secondary | ICD-10-CM | POA: Insufficient documentation

## 2023-12-27 DIAGNOSIS — I129 Hypertensive chronic kidney disease with stage 1 through stage 4 chronic kidney disease, or unspecified chronic kidney disease: Secondary | ICD-10-CM | POA: Diagnosis not present

## 2023-12-27 DIAGNOSIS — N189 Chronic kidney disease, unspecified: Secondary | ICD-10-CM | POA: Diagnosis not present

## 2023-12-27 DIAGNOSIS — N281 Cyst of kidney, acquired: Secondary | ICD-10-CM | POA: Diagnosis not present

## 2023-12-28 DIAGNOSIS — N250 Renal osteodystrophy: Secondary | ICD-10-CM | POA: Diagnosis not present

## 2023-12-28 DIAGNOSIS — N2581 Secondary hyperparathyroidism of renal origin: Secondary | ICD-10-CM | POA: Diagnosis not present

## 2023-12-28 DIAGNOSIS — D509 Iron deficiency anemia, unspecified: Secondary | ICD-10-CM | POA: Diagnosis not present

## 2023-12-28 DIAGNOSIS — Z992 Dependence on renal dialysis: Secondary | ICD-10-CM | POA: Diagnosis not present

## 2023-12-28 DIAGNOSIS — N186 End stage renal disease: Secondary | ICD-10-CM | POA: Diagnosis not present

## 2023-12-28 DIAGNOSIS — D631 Anemia in chronic kidney disease: Secondary | ICD-10-CM | POA: Diagnosis not present

## 2023-12-29 ENCOUNTER — Encounter: Payer: Self-pay | Admitting: Urology

## 2023-12-29 ENCOUNTER — Ambulatory Visit: Payer: Medicare Other | Admitting: Urology

## 2023-12-29 VITALS — BP 113/66 | HR 84

## 2023-12-29 DIAGNOSIS — R3914 Feeling of incomplete bladder emptying: Secondary | ICD-10-CM

## 2023-12-29 DIAGNOSIS — N401 Enlarged prostate with lower urinary tract symptoms: Secondary | ICD-10-CM | POA: Diagnosis not present

## 2023-12-29 DIAGNOSIS — Z85528 Personal history of other malignant neoplasm of kidney: Secondary | ICD-10-CM | POA: Diagnosis not present

## 2023-12-29 DIAGNOSIS — Z8546 Personal history of malignant neoplasm of prostate: Secondary | ICD-10-CM

## 2023-12-29 DIAGNOSIS — R3912 Poor urinary stream: Secondary | ICD-10-CM

## 2023-12-29 DIAGNOSIS — N138 Other obstructive and reflux uropathy: Secondary | ICD-10-CM

## 2023-12-29 LAB — BLADDER SCAN: Scan Result: 0

## 2023-12-29 MED ORDER — TAMSULOSIN HCL 0.4 MG PO CAPS
0.4000 mg | ORAL_CAPSULE | Freq: Every day | ORAL | 3 refills | Status: DC
Start: 2023-12-29 — End: 2024-06-27

## 2023-12-29 NOTE — Progress Notes (Unsigned)
Subjective:  1. Weak urinary stream   2. History of prostate cancer   3. BPH with urinary obstruction   4. History of kidney cancer     12/29/23: Todd Robertson returns today in f/u for his history of prostate cancer treated with EXRT.  His PSA is stable at 0.8.  He remains on dialysis but still makes some urine but the amount has declined.   He didn't respond to lasix and he was getting cramps when they pulled too much fluid with the dialysis. His IPSS is 24 but he feels his symptoms are stable.  He remains on tamsulosin.   He gets relief when he voids. His weight is up with fluid retention and decreases with dialysis.  He has a history of UTI's but no recent symptoms or hematuria.   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  The bladder was reported as partially distended but it didn't look to bad to me.   06/30/23: Todd Robertson returns today in f/u for his history below. His PSA is stable at 0.9 on 06/23/23.  His IPSS is 16 with nocturia x 1 and frequency with some obstructive symptoms. He has occasional initial dysuria but no hematuria.  He remains on tamsulosin.  He has chronic constipation.  He has no weight loss or bone pain.    12/30/22: Todd Robertson returns today in f/u for the history of prostate cancer with prior EXRT.  He didn't get a PSA prior to this visit. The last was up slightly to 0.9.  He remains on dialysis and is still voiding a fair amount on non-diaylsis days.  His IPSS is 14.  He has no nocturia.  He has no bone pain or weight loss.  He has no GI complaints.  A CXR done for f/u of his history of a right RCCa with prior nephrectomy in 8/23 and it showed no mets.   He has had no UTI symptoms.  He remains on tamsulosin for LUTS.  UA has 6-10 WBC but no bacteria.   06/17/22: Todd Robertson returns today in f/u for his history of prostate cancer treated with EXRT 8-9 years ago.  His PSA has been rising but slowly with an increased from 0.8 to  0.9 over the last 6 months. 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  He continues to make moderate urine.  His iPSS is 14 with a reduced stream and urgency.  He remains on tamsulosin. The urine is clear today.  He has no weight loss or bone pain.  He didn't get the CXR that was ordered to be done in April for f/u of the renal cell CA.  UA is unremarkable.   12/17/21: Todd Robertson returns today in f/u.   He last saw Dr. Ronne Robertson on 08/26/21 for f/u of his history of a right nephrectomy for RCCa in 7/21 and imaging was unremarkable.  He is to have a CXR in a couple of months.  He has a history of prostate cancer treated with EXRT and his PSA is 0.8 which is a slight increase from 0.6 in 5/22.  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  He has a history of UTI's.  He last got Cipro in 10/22.  He had cystoscopy on 04/09/21.   HIs UA today is unremarkable.  He has some burning at times.  His IPSS is 17 with frequency and nocturia x 2.    06/18/21:  Mr.  Robertson returns today in f/u.  He is doing well with an IPSS of 3.  He has had no symptoms of a recurrent infection.   He didn't get a UA today.   He remains on tamsulosin but is off of of the Levaquin prescribed for post dialysis use on 04/15/21.    He had a right nephrectomy for RCCa in 7/21 and is to have an [place] and CXR next month.   04/09/21: Todd Robertson returns today in f/u for cystoscopy to evaluate his persistent voiding symptoms and cystitis.   His culture on 04/02/21 was negative.   His PSA is 0.6 which is down from the prior levels.  He was given gent 160mg  at his last visit and had some transient symptom improvement.     GU Hx: Todd Robertson returns today in f/u for his history of cystitis.  He has dysuria and difficulty voiding.  He has some urgency and frequency.  He has had no hematuria.   He has been treated with bactrim after a round of ceftin.  He has been off of the anitbiotics for about 2 weeks.  He has had no fever.  He had Klebsiella on 03/10/21 and it was sensitive to the  bactrim which he was last given.   He has variable urine output.   He has increased frequency in the afternoon.  He will generally go 4-5x daily.   He has a history of prostate cancer treated with radiation therapy 7-8 years ago.   He had a right radical nephrectomy in 7/21.   The urinary symptoms began after the procedure.   The last PSA's I find were 4.1 in 1/17 and 1.4 in 8/17.     IPSS     Row Name 12/29/23 1300         International Prostate Symptom Score   How often have you had the sensation of not emptying your bladder? Almost always     How often have you had to urinate less than every two hours? Not at All     How often have you found you stopped and started again several times when you urinated? More than half the time     How often have you found it difficult to postpone urination? Almost always     How often have you had a weak urinary stream? Almost always     How often have you had to strain to start urination? Almost always     How many times did you typically get up at night to urinate? None     Total IPSS Score 24       Quality of Life due to urinary symptoms   If you were to spend the rest of your life with your urinary condition just the way it is now how would you feel about that? Mixed                 ROS:  ROS:  A complete review of systems was performed.  All systems are negative except for pertinent findings as noted.   Review of Systems  Respiratory:  Positive for shortness of breath.   Cardiovascular:  Positive for leg swelling.  Gastrointestinal:  Positive for constipation.  Musculoskeletal:  Positive for back pain and joint pain.  Skin:  Positive for itching.  Neurological:  Weakness: on dialysis days.    No Known Allergies   Outpatient Encounter Medications as of 12/29/2023  Medication Sig   acetaminophen (TYLENOL) 500 MG  tablet Take 500-1,000 mg by mouth every 6 (six) hours as needed for moderate pain.   albuterol (VENTOLIN HFA) 108 (90  Base) MCG/ACT inhaler Inhale 1-2 puffs into the lungs every 6 (six) hours as needed for wheezing or shortness of breath. (Patient taking differently: Inhale 1-2 puffs into the lungs 2 (two) times daily.)   allopurinol (ZYLOPRIM) 300 MG tablet Take 300 mg by mouth daily.   atorvastatin (LIPITOR) 40 MG tablet Take 1 tablet (40 mg total) by mouth daily. (Patient taking differently: Take 40 mg by mouth at bedtime.)   b complex-vitamin c-folic acid (NEPHRO-VITE) 0.8 MG TABS tablet Take 1 tablet by mouth at bedtime.   BREO ELLIPTA 100-25 MCG/ACT AEPB Inhale 1 puff into the lungs daily.   calcium acetate (PHOSLO) 667 MG capsule Take 3 capsules (2,001 mg total) by mouth 3 (three) times daily with meals.   cetirizine (ZYRTEC) 10 MG tablet Take 10 mg by mouth daily.   Cholecalciferol (VITAMIN D) 50 MCG (2000 UT) tablet Take 1 tablet (2,000 Units total) by mouth daily.   methocarbamol (ROBAXIN) 500 MG tablet Take 500 mg by mouth 3 (three) times a week. Take Sun, Tue, and Thurs (day before dialysis)   metoprolol tartrate (LOPRESSOR) 25 MG tablet Take 0.5 tablets (12.5 mg total) by mouth 2 (two) times daily. (Patient taking differently: Take 12.5 mg by mouth 2 (two) times daily. after dialysis)   midodrine (PROAMATINE) 10 MG tablet TAKE 1 TABLET BY MOUTH THREE TIMES DAILY WITH MEALS   Multiple Vitamins-Minerals (CENTRUM SILVER 50+MEN) TABS Take 1 tablet by mouth daily.   Naphazoline HCl (CLEAR EYES OP) Place 1 drop into both eyes daily.   Polyethylene Glycol 3350 (MIRALAX PO) Take 17 g by mouth daily as needed (constipation).   senna (SENOKOT) 8.6 MG tablet Take 1-2 tablets by mouth daily as needed for constipation.   trolamine salicylate (ASPERCREME) 10 % cream Apply 1 Application topically as needed for muscle pain.   [DISCONTINUED] tamsulosin (FLOMAX) 0.4 MG CAPS capsule Take 1 capsule (0.4 mg total) by mouth daily.   tamsulosin (FLOMAX) 0.4 MG CAPS capsule Take 1 capsule (0.4 mg total) by mouth daily.   No  facility-administered encounter medications on file as of 12/29/2023.    Past Medical History:  Diagnosis Date   Arthritis    Cancer (HCC)    prostate   Cardiomyopathy    secondary   CKD (chronic kidney disease)    DM2 (diabetes mellitus, type 2) (HCC)    Gout    HTN (hypertension)    unspec   Hypercholesterolemia    Nonischemic cardiomyopathy (HCC)    a. EF 40-50% by most recent 2-D echo b. EF 25% by cardiac cath in 2004   Overweight(278.02)     Past Surgical History:  Procedure Laterality Date   AV FISTULA PLACEMENT Left 06/06/2020   Procedure: LEFT ARM ARTERIOVENOUS (AV) GRAFT USING 45CM GORTEX;  Surgeon: Larina Earthly, MD;  Location: [facility] OR;  Service: Vascular;  Laterality: Left;   AV FISTULA PLACEMENT Left 06/08/2022   Procedure: INSERTION OF ARTERIOVENOUS GORE-TEX GRAFT UPPER ARM;  Surgeon: Larina Earthly, MD;  Location: [facility]S;  Service: Vascular;  Laterality: Left;   AVGG REMOVAL Left 07/27/2022   Procedure: EXCISION OF INFECTED PORTION OF LEFT FOREARM ARTERIOVENOUS GORETEX GRAFT (AVGG);  Surgeon: Larina Earthly, MD;  Location: [facility];  Service: Vascular;  Laterality: Left;   COLONOSCOPY     HERNIA REPAIR     INSERTION OF DIALYSIS  CATHETER Right 06/06/2020   Procedure: INSERTION OF DIALYSIS CATHETER USING 23CM DOUBLE LUMEN CATHETER;  Surgeon: Larina Earthly, MD;  Location: [facility] OR;  Service: Vascular;  Laterality: Right;   IR ANGIOGRAM EXTREMITY LEFT  10/25/2022   IR FLUORO GUIDE CV LINE RIGHT  05/20/2020   IR THROMBECTOMY AV FISTULA W/THROMBOLYSIS/PTA INC/SHUNT/IMG LEFT Left 10/25/2022   IR THROMBECTOMY AV FISTULA W/THROMBOLYSIS/PTA INC/SHUNT/IMG LEFT Left 08/05/2023   IR US GUIDE VASC ACCESS LEFT  10/25/2022   IR US GUIDE VASC ACCESS LEFT  08/09/2023   IR US GUIDE VASC ACCESS RIGHT  05/20/2020   KNEE ARTHROPLASTY Left 08/08/2017   Procedure: LEFT TOTAL KNEE ARTHROPLASTY WITH COMPUTER NAVIGATION;  Surgeon: Samson Frederic, MD;  Location: [facility] OR;  Service: Orthopedics;  Laterality:  Left;  Needs RNFA   KNEE ARTHROPLASTY Right 11/17/2017   Procedure: RIGHT TOTAL KNEE ARTHROPLASTY WITH COMPUTER NAVIGATION;  Surgeon: Samson Frederic, MD;  Location: [facility];  Service: Orthopedics;  Laterality: Right;  NEEDS RNFA   PERIPHERAL VASCULAR THROMBECTOMY Left 08/22/2023   Procedure: PERIPHERAL VASCULAR THROMBECTOMY;  Surgeon: Annice Needy, MD;  Location: [facility] INVASIVE CV LAB;  Service: Cardiovascular;  Laterality: Left;   REMOVAL OF A DIALYSIS CATHETER Right 09/07/2022   Procedure: MINOR REMOVAL OF A TUNNELED DIALYSIS CATHETER;  Surgeon: Larina Earthly, MD;  Location: [facility];  Service: Vascular;  Laterality: Right;   ROBOT ASSISTED LAPAROSCOPIC NEPHRECTOMY Right 05/16/2020   Procedure: XI ROBOTIC ASSISTED LAPAROSCOPIC NEPHRECTOMY;  Surgeon: Malen Gauze, MD;  Location: [facility];  Service: Urology;  Laterality: Right;  2.5 hrs    Social History   Socioeconomic History   Marital status: Married    Spouse name: Not on file   Number of children: 2   Years of education: Not on file   Highest education level: Not on file  Occupational History   Occupation: retired  Tobacco Use   Smoking status: Former    Current packs/day: 0.00    Average packs/day: 0.5 packs/day for 20.0 years (10.0 ttl pk-yrs)    Types: Cigarettes    Start date: 02/12/1966    Quit date: 11/08/1984    Years since quitting: 39.1   Smokeless tobacco: Never  Vaping Use   Vaping status: Not on file  Substance and Sexual Activity   Alcohol use: No    Alcohol/week: 0.0 standard drinks of alcohol   Drug use: No   Sexual activity: Yes    Birth control/protection: None  Other Topics Concern   Not on file  Social History Narrative   Full time.    Social Drivers of Corporate investment banker Strain: Not on file  Food Insecurity: No Food Insecurity (12/04/2020)   Received from [facility], [facility]   Hunger Vital Sign    Worried About Running Out of Food in the Last Year: Never true    Ran Out of Food in  the Last Year: Never true  Transportation Needs: Not on file  Physical Activity: Not on file  Stress: No Stress Concern Present (01/27/2021)   Received from [facility], [facility] - Occupational Stress Questionnaire    Feeling of Stress : Only a little  Social Connections: Unknown (03/23/2022)   Received from [facility], [facility]   Social Network    Social Network: Not on file  Intimate Partner Violence: Unknown (02/12/2022)   Received from [facility], [facility]   HITS    Physically Hurt: Not  on file    Insult or Talk Down To: Not on file    Threaten Physical Harm: Not on file    Scream or Curse: Not on file    Family History  Problem Relation Age of Onset   Hypertension Mother    Kidney disease Mother    Heart disease Father    Kidney disease Other        Objective: Vitals:   12/29/23 1338  BP: 113/66  Pulse: 84      Physical Exam Vitals reviewed.  Constitutional:      Appearance: Normal appearance.  Neurological:     Mental Status: He is alert.     Lab Results:  PSA No results found for: "PSA" No results found for: "TESTOSTERONE"  No results found for this or any previous visit (from the past 24 hours).     Lab Results  Component Value Date   PSA1 0.8 12/19/2023   PSA1 0.9 06/23/2023   PSA1 0.9 06/10/2022     Studies/Results:  CLINICAL DATA:  History of kidney cancer.   EXAM: CHEST - 2 VIEW   COMPARISON:  June 06, 2020   FINDINGS: The heart size and mediastinal contours are stable. Right central venous line is identified unchanged. Mild linear opacities are identified in bilateral lung bases favor atelectasis or scar. No pulmonary edema or pleural effusion is noted. No definite focal pulmonary mass noted. Lungs are hyperinflated. The visualized skeletal structures are stable.   IMPRESSION: Mild linear opacities identified in bilateral lung bases favor atelectasis or  scar.     Electronically Signed   By: Sherian Rein M.D.   On: 06/25/2022 10:50   Chest CT on 06/20/23 at [facility] showed no mets.  US RENAL Result Date: 12/28/2023 : PROCEDURE: US RENAL HISTORY: Patient is a 78 y/o M with history of right nephrectomy 05/16/2020 due to cancer. On dialysis. Hypertension. Diabetes. Chronic kidney disease. COMPARISON: CT renal 08/19/2021, U/S renal 02/02/2021. TECHNIQUE: Two-dimensional grayscale and color Doppler ultrasound of the kidneys was performed. FINDINGS: The urinary bladder is partially distended. The right kidney is surgically absent. The right renal fossa appears unremarkable. The left kidney measures 10.0 x 5.4 x 4.6 cm. Renal cortical echotexture is mildly increased. Calcifications are noted within the cortex. There is no hydronephrosis. There are no stones. There are multiple simple cysts with the largest measuring 2.4 cm at the medial inferior pole. IMPRESSION: 1.  Surgically absent right kidney. 2. Multiple left renal cysts. Mild increase in echogenicity of the left kidney, consistent with medical renal disease. Thank you for allowing [place] to assist in the care of this patient. Electronically Signed   By: Lestine Box M.D.   On: 12/28/2023 12:27     Assessment & Plan: LUTS with a history of UTI.  He has no irritative symptoms but couldn't get a UA.   Hx of prostate cancer with prior radiation therapy.  PSA is stable over the last 6 months.  Repeat in 6 months.   BPH with sensation of incomplete bladder emptying but 0ml PVR.  He is doing well on tamsulosin.  Hx of right RCCa with prior nephrectomy.   CXR today and  in 6 months.   Meds ordered this encounter  Medications   tamsulosin (FLOMAX) 0.4 MG CAPS capsule    Sig: Take 1 capsule (0.4 mg total) by mouth daily.    Dispense:  90 capsule    Refill:  3    Was just refilled on 2/19  but needed to be updated for the year with refills.      Orders Placed This Encounter  Procedures   DG Chest 2  View    Standing Status:   Future    Expected Date:   12/29/2023    Expiration Date:   04/27/2024    Reason for Exam (SYMPTOM  OR DIAGNOSIS REQUIRED):   history of kidney cancer    Preferred imaging location?:   [facility]    Radiology Contrast Protocol - do NOT remove file path:   [email or web address]   DG Chest 2 View    Standing Status:   Future    Expected Date:   06/27/2024    Expiration Date:   12/28/2024    Reason for Exam (SYMPTOM  OR DIAGNOSIS REQUIRED):   history of kidney cancer    Preferred imaging location?:   [facility]    Radiology Contrast Protocol - do NOT remove file path:   [email or web address]   PSA    Standing Status:   Future    Expected Date:   06/27/2024    Expiration Date:   12/28/2024   Bladder scan   BLADDER SCAN AMB NON-IMAGING      Return in about 6 months (around 06/27/2024) for with PSA with Dr. Ronne Robertson or Maralyn Sago. .   CC: Kirstie Peri, MD      Todd Robertson 12/30/2023 Patient ID: Todd Robertson, male   DOB: 10-Nov-1945, 78 y.o.   MRN: [id number]

## 2023-12-29 NOTE — Progress Notes (Signed)
Bladder Scan completed today.  Patient cannot void prior to the bladder scan. Bladder scan result: 0  Performed By: Guss Bunde, CMA  Additional notes-

## 2023-12-31 DIAGNOSIS — N250 Renal osteodystrophy: Secondary | ICD-10-CM | POA: Diagnosis not present

## 2023-12-31 DIAGNOSIS — D631 Anemia in chronic kidney disease: Secondary | ICD-10-CM | POA: Diagnosis not present

## 2023-12-31 DIAGNOSIS — N2581 Secondary hyperparathyroidism of renal origin: Secondary | ICD-10-CM | POA: Diagnosis not present

## 2023-12-31 DIAGNOSIS — N186 End stage renal disease: Secondary | ICD-10-CM | POA: Diagnosis not present

## 2023-12-31 DIAGNOSIS — Z992 Dependence on renal dialysis: Secondary | ICD-10-CM | POA: Diagnosis not present

## 2023-12-31 DIAGNOSIS — D509 Iron deficiency anemia, unspecified: Secondary | ICD-10-CM | POA: Diagnosis not present

## 2024-01-02 ENCOUNTER — Ambulatory Visit (HOSPITAL_COMMUNITY)
Admission: RE | Admit: 2024-01-02 | Discharge: 2024-01-02 | Disposition: A | Discharge status: Home / Self Care | Payer: Medicare Other | Source: Ambulatory Visit | Attending: Nurse Practitioner | Admitting: Nurse Practitioner

## 2024-01-02 ENCOUNTER — Other Ambulatory Visit: Payer: Self-pay | Admitting: Nurse Practitioner

## 2024-01-02 ENCOUNTER — Ambulatory Visit (HOSPITAL_COMMUNITY): Payer: Medicare Other

## 2024-01-02 DIAGNOSIS — E042 Nontoxic multinodular goiter: Secondary | ICD-10-CM | POA: Insufficient documentation

## 2024-01-03 ENCOUNTER — Emergency Department: Payer: Medicare Other

## 2024-01-03 ENCOUNTER — Other Ambulatory Visit: Payer: Self-pay

## 2024-01-03 ENCOUNTER — Observation Stay
Admission: EM | Admit: 2024-01-03 | Discharge: 2024-01-04 | Disposition: A | Discharge status: Home / Self Care | Payer: Medicare Other | Attending: Hospitalist | Admitting: Hospitalist

## 2024-01-03 DIAGNOSIS — N186 End stage renal disease: Secondary | ICD-10-CM | POA: Insufficient documentation

## 2024-01-03 DIAGNOSIS — T82868A Thrombosis of vascular prosthetic devices, implants and grafts, initial encounter: Secondary | ICD-10-CM | POA: Diagnosis not present

## 2024-01-03 DIAGNOSIS — Z87891 Personal history of nicotine dependence: Secondary | ICD-10-CM | POA: Insufficient documentation

## 2024-01-03 DIAGNOSIS — E1122 Type 2 diabetes mellitus with diabetic chronic kidney disease: Secondary | ICD-10-CM | POA: Diagnosis not present

## 2024-01-03 DIAGNOSIS — Y828 Other medical devices associated with adverse incidents: Secondary | ICD-10-CM | POA: Insufficient documentation

## 2024-01-03 DIAGNOSIS — T82598A Other mechanical complication of other cardiac and vascular devices and implants, initial encounter: Secondary | ICD-10-CM | POA: Diagnosis not present

## 2024-01-03 DIAGNOSIS — I517 Cardiomegaly: Secondary | ICD-10-CM | POA: Diagnosis not present

## 2024-01-03 DIAGNOSIS — I428 Other cardiomyopathies: Secondary | ICD-10-CM | POA: Insufficient documentation

## 2024-01-03 DIAGNOSIS — Z992 Dependence on renal dialysis: Secondary | ICD-10-CM | POA: Diagnosis not present

## 2024-01-03 DIAGNOSIS — J449 Chronic obstructive pulmonary disease, unspecified: Secondary | ICD-10-CM | POA: Insufficient documentation

## 2024-01-03 DIAGNOSIS — Z789 Other specified health status: Principal | ICD-10-CM

## 2024-01-03 DIAGNOSIS — Z8546 Personal history of malignant neoplasm of prostate: Secondary | ICD-10-CM | POA: Diagnosis not present

## 2024-01-03 DIAGNOSIS — I10 Essential (primary) hypertension: Secondary | ICD-10-CM | POA: Diagnosis not present

## 2024-01-03 DIAGNOSIS — I120 Hypertensive chronic kidney disease with stage 5 chronic kidney disease or end stage renal disease: Secondary | ICD-10-CM | POA: Insufficient documentation

## 2024-01-03 DIAGNOSIS — T82898A Other specified complication of vascular prosthetic devices, implants and grafts, initial encounter: Secondary | ICD-10-CM | POA: Diagnosis not present

## 2024-01-03 DIAGNOSIS — Z79899 Other long term (current) drug therapy: Secondary | ICD-10-CM | POA: Diagnosis not present

## 2024-01-03 LAB — CBC
HCT: 35.2 % — ABNORMAL LOW (ref 39.0–52.0)
Hemoglobin: 11.5 g/dL — ABNORMAL LOW (ref 13.0–17.0)
MCH: 33 pg (ref 26.0–34.0)
MCHC: 32.7 g/dL (ref 30.0–36.0)
MCV: 100.9 fL — ABNORMAL HIGH (ref 80.0–100.0)
Platelets: 218 10*3/uL (ref 150–400)
RBC: 3.49 MIL/uL — ABNORMAL LOW (ref 4.22–5.81)
RDW: 15.9 % — ABNORMAL HIGH (ref 11.5–15.5)
WBC: 6.7 10*3/uL (ref 4.0–10.5)
nRBC: 0 % (ref 0.0–0.2)

## 2024-01-03 LAB — BASIC METABOLIC PANEL
Anion gap: 15 (ref 5–15)
BUN: 82 mg/dL — ABNORMAL HIGH (ref 8–23)
CO2: 24 mmol/L (ref 22–32)
Calcium: 9.5 mg/dL (ref 8.9–10.3)
Chloride: 101 mmol/L (ref 98–111)
Creatinine, Ser: 15.59 mg/dL — ABNORMAL HIGH (ref 0.61–1.24)
GFR, Estimated: 3 mL/min — ABNORMAL LOW (ref >60)
Glucose, Bld: 78 mg/dL (ref 70–99)
Potassium: 4.9 mmol/L (ref 3.5–5.1)
Sodium: 140 mmol/L (ref 135–145)

## 2024-01-03 LAB — HEPATITIS B SURFACE ANTIGEN: Hepatitis B Surface Ag: NONREACTIVE

## 2024-01-03 MED ORDER — ACETAMINOPHEN 500 MG PO TABS: 500.0000 mg | ORAL_TABLET | Freq: Four times a day (QID) | ORAL | Status: DC | PRN

## 2024-01-03 MED ORDER — CHLORHEXIDINE GLUCONATE CLOTH 2 % EX PADS
6.0000 | MEDICATED_PAD | Freq: Every day | CUTANEOUS | Status: DC
  Administered 2024-01-04: 6 via TOPICAL

## 2024-01-03 MED ORDER — ONDANSETRON HCL 4 MG/2ML IJ SOLN: 4.0000 mg | Freq: Four times a day (QID) | INTRAMUSCULAR | Status: DC | PRN

## 2024-01-03 MED ORDER — ALLOPURINOL 100 MG PO TABS: 300.0000 mg | ORAL_TABLET | Freq: Every day | ORAL | Status: DC

## 2024-01-03 MED ORDER — TAMSULOSIN HCL 0.4 MG PO CAPS
0.4000 mg | ORAL_CAPSULE | Freq: Every day | ORAL | Status: DC
  Administered 2024-01-03: 0.4 mg via ORAL
  Filled 2024-01-03: qty 1

## 2024-01-03 MED ORDER — LORATADINE 10 MG PO TABS: 10.0000 mg | ORAL_TABLET | Freq: Every day | ORAL | Status: DC

## 2024-01-03 MED ORDER — ONDANSETRON HCL 4 MG PO TABS: 4.0000 mg | ORAL_TABLET | Freq: Four times a day (QID) | ORAL | Status: DC | PRN

## 2024-01-03 MED ORDER — METHOCARBAMOL 500 MG PO TABS
500.0000 mg | ORAL_TABLET | ORAL | Status: DC
  Administered 2024-01-03: 500 mg via ORAL
  Filled 2024-01-03: qty 1

## 2024-01-03 MED ORDER — FLUTICASONE FUROATE-VILANTEROL 100-25 MCG/ACT IN AEPB
1.0000 | INHALATION_SPRAY | Freq: Every day | RESPIRATORY_TRACT | Status: DC
  Administered 2024-01-04: 1 via RESPIRATORY_TRACT
  Filled 2024-01-03: qty 28

## 2024-01-03 MED ORDER — ATORVASTATIN CALCIUM 20 MG PO TABS
40.0000 mg | ORAL_TABLET | Freq: Every day | ORAL | Status: DC
  Administered 2024-01-03: 40 mg via ORAL
  Filled 2024-01-03: qty 2

## 2024-01-03 MED ORDER — HEPARIN SODIUM (PORCINE) 5000 UNIT/ML IJ SOLN
5000.0000 [IU] | Freq: Two times a day (BID) | INTRAMUSCULAR | Status: DC
  Administered 2024-01-03 – 2024-01-04 (×2): 5000 [IU] via SUBCUTANEOUS
  Filled 2024-01-03 (×2): qty 1

## 2024-01-03 MED ORDER — SENNA 8.6 MG PO TABS: 1.0000 | ORAL_TABLET | Freq: Every day | ORAL | Status: DC | PRN

## 2024-01-03 MED ORDER — HYDROMORPHONE HCL 1 MG/ML IJ SOLN: 0.5000 mg | INTRAMUSCULAR | Status: DC | PRN

## 2024-01-03 MED ORDER — METOPROLOL TARTRATE 25 MG PO TABS
12.5000 mg | ORAL_TABLET | Freq: Two times a day (BID) | ORAL | Status: DC
  Administered 2024-01-03: 12.5 mg via ORAL
  Filled 2024-01-03: qty 1

## 2024-01-03 MED ORDER — ALBUTEROL SULFATE (2.5 MG/3ML) 0.083% IN NEBU
2.5000 mg | INHALATION_SOLUTION | Freq: Two times a day (BID) | RESPIRATORY_TRACT | Status: DC
  Administered 2024-01-03: 2.5 mg via RESPIRATORY_TRACT
  Filled 2024-01-03: qty 3

## 2024-01-03 MED ORDER — CALCIUM ACETATE (PHOS BINDER) 667 MG PO CAPS
2001.0000 mg | ORAL_CAPSULE | Freq: Three times a day (TID) | ORAL | Status: DC
  Administered 2024-01-04: 2001 mg via ORAL
  Filled 2024-01-03: qty 3

## 2024-01-03 MED ORDER — MIDODRINE HCL 5 MG PO TABS
10.0000 mg | ORAL_TABLET | Freq: Three times a day (TID) | ORAL | Status: DC
  Administered 2024-01-04 (×3): 10 mg via ORAL
  Filled 2024-01-03 (×3): qty 2

## 2024-01-03 NOTE — Consult Note (Signed)
 Hospital Consult    Reason for Consult:  Non functioning A/V Fistula Dialysis Access Requesting Physician:  Dr Claudell Kyle MD  MRN #:  [id number]  History of Present Illness: This is a 78 y.o. male who presents to [facility] emergency department after he was unable to be accessed at dialysis outpatient today.  He was told that his fistula was clogged and needed to be declogged prior to attempting dialysis again.  Patient endorses he lives in [place] and receives dialysis access there but would like dialysis here before leaving the hospital.  Patient endorses he needs to take midodrine twice before dialysis for chronic low blood pressure.  Denies any pain or difficulties with his left arm.  No complaints at this time.  Vitals all remained stable.  Past Medical History:  Diagnosis Date   Arthritis    Cancer (HCC)    prostate   Cardiomyopathy    secondary   CKD (chronic kidney disease)    DM2 (diabetes mellitus, type 2) (HCC)    Gout    HTN (hypertension)    unspec   Hypercholesterolemia    Nonischemic cardiomyopathy (HCC)    a. EF 40-50% by most recent 2-D echo b. EF 25% by cardiac cath in 2004   Overweight(278.02)     Past Surgical History:  Procedure Laterality Date   AV FISTULA PLACEMENT Left 06/06/2020   Procedure: LEFT ARM ARTERIOVENOUS (AV) GRAFT USING 45CM GORTEX;  Surgeon: Larina Earthly, MD;  Location: [facility] OR;  Service: Vascular;  Laterality: Left;   AV FISTULA PLACEMENT Left 06/08/2022   Procedure: INSERTION OF ARTERIOVENOUS GORE-TEX GRAFT UPPER ARM;  Surgeon: Larina Earthly, MD;  Location: [facility] ORS;  Service: Vascular;  Laterality: Left;   AVGG REMOVAL Left 07/27/2022   Procedure: EXCISION OF INFECTED PORTION OF LEFT FOREARM ARTERIOVENOUS GORETEX GRAFT (AVGG);  Surgeon: Larina Earthly, MD;  Location: [facility]S;  Service: Vascular;  Laterality: Left;   COLONOSCOPY     HERNIA REPAIR     INSERTION OF DIALYSIS CATHETER Right 06/06/2020   Procedure: INSERTION OF DIALYSIS CATHETER  USING 23CM DOUBLE LUMEN CATHETER;  Surgeon: Larina Earthly, MD;  Location: [facility] OR;  Service: Vascular;  Laterality: Right;   IR ANGIOGRAM EXTREMITY LEFT  10/25/2022   IR FLUORO GUIDE CV LINE RIGHT  05/20/2020   IR THROMBECTOMY AV FISTULA W/THROMBOLYSIS/PTA INC/SHUNT/IMG LEFT Left 10/25/2022   IR THROMBECTOMY AV FISTULA W/THROMBOLYSIS/PTA INC/SHUNT/IMG LEFT Left 08/05/2023   IR US GUIDE VASC ACCESS LEFT  10/25/2022   IR US GUIDE VASC ACCESS LEFT  08/09/2023   IR US GUIDE VASC ACCESS RIGHT  05/20/2020   KNEE ARTHROPLASTY Left 08/08/2017   Procedure: LEFT TOTAL KNEE ARTHROPLASTY WITH COMPUTER NAVIGATION;  Surgeon: Samson Frederic, MD;  Location: [facility] OR;  Service: Orthopedics;  Laterality: Left;  Needs RNFA   KNEE ARTHROPLASTY Right 11/17/2017   Procedure: RIGHT TOTAL KNEE ARTHROPLASTY WITH COMPUTER NAVIGATION;  Surgeon: Samson Frederic, MD;  Location: [facility] ORS;  Service: Orthopedics;  Laterality: Right;  NEEDS RNFA   PERIPHERAL VASCULAR THROMBECTOMY Left 08/22/2023   Procedure: PERIPHERAL VASCULAR THROMBECTOMY;  Surgeon: Annice Needy, MD;  Location: [facility] INVASIVE CV LAB;  Service: Cardiovascular;  Laterality: Left;   REMOVAL OF A DIALYSIS CATHETER Right 09/07/2022   Procedure: MINOR REMOVAL OF A TUNNELED DIALYSIS CATHETER;  Surgeon: Larina Earthly, MD;  Location: [facility];  Service: Vascular;  Laterality: Right;   ROBOT ASSISTED LAPAROSCOPIC NEPHRECTOMY Right 05/16/2020   Procedure: XI ROBOTIC ASSISTED LAPAROSCOPIC NEPHRECTOMY;  Surgeon: Malen Gauze, MD;  Location: [facility];  Service: Urology;  Laterality: Right;  2.5 hrs    No Known Allergies  Prior to Admission medications   Medication Sig Start Date End Date Taking? Authorizing Provider  acetaminophen (TYLENOL) 500 MG tablet Take 500-1,000 mg by mouth every 6 (six) hours as needed for moderate pain.    [provider]  albuterol (VENTOLIN HFA) 108 (90 Base) MCG/ACT inhaler Inhale 1-2 puffs into the lungs every 6 (six) hours as needed for  wheezing or shortness of breath. Patient taking differently: Inhale 1-2 puffs into the lungs 2 (two) times daily. 06/24/20   Angiulli, Mcarthur Rossetti, PA-C  allopurinol (ZYLOPRIM) 300 MG tablet Take 300 mg by mouth daily. 10/21/20   [provider]  atorvastatin (LIPITOR) 40 MG tablet Take 1 tablet (40 mg total) by mouth daily. Patient taking differently: Take 40 mg by mouth at bedtime. 06/24/20   Angiulli, Mcarthur Rossetti, PA-C  b complex-vitamin c-folic acid (NEPHRO-VITE) 0.8 MG TABS tablet Take 1 tablet by mouth at bedtime.    [provider]  BREO ELLIPTA 100-25 MCG/ACT AEPB Inhale 1 puff into the lungs daily. 05/18/22   [provider]  calcium acetate (PHOSLO) 667 MG capsule Take 3 capsules (2,001 mg total) by mouth 3 (three) times daily with meals. 06/24/20   Angiulli, Mcarthur Rossetti, PA-C  cetirizine (ZYRTEC) 10 MG tablet Take 10 mg by mouth daily.    [provider]  Cholecalciferol (VITAMIN D) 50 MCG (2000 UT) tablet Take 1 tablet (2,000 Units total) by mouth daily. 06/24/20   Angiulli, Mcarthur Rossetti, PA-C  methocarbamol (ROBAXIN) 500 MG tablet Take 500 mg by mouth 3 (three) times a week. Take Sun, Tue, and Thurs (day before dialysis) 05/11/22   [provider]  metoprolol tartrate (LOPRESSOR) 25 MG tablet Take 0.5 tablets (12.5 mg total) by mouth 2 (two) times daily. Patient taking differently: Take 12.5 mg by mouth 2 (two) times daily. after dialysis 07/16/20   Netta Neat., NP  midodrine (PROAMATINE) 10 MG tablet TAKE 1 TABLET BY MOUTH THREE TIMES DAILY WITH MEALS 10/18/23   Antoine Poche, MD  Multiple Vitamins-Minerals (CENTRUM SILVER 50+MEN) TABS Take 1 tablet by mouth daily.    [provider]  Naphazoline HCl (CLEAR EYES OP) Place 1 drop into both eyes daily.    [provider]  Polyethylene Glycol 3350 (MIRALAX PO) Take 17 g by mouth daily as needed (constipation).    [provider]  senna (SENOKOT) 8.6 MG tablet Take 1-2 tablets  by mouth daily as needed for constipation.    [provider]  tamsulosin (FLOMAX) 0.4 MG CAPS capsule Take 1 capsule (0.4 mg total) by mouth daily. 12/29/23   Bjorn Pippin, MD  trolamine salicylate (ASPERCREME) 10 % cream Apply 1 Application topically as needed for muscle pain.    [provider]    Social History   Socioeconomic History   Marital status: Married    Spouse name: Not on file   Number of children: 2   Years of education: Not on file   Highest education level: Not on file  Occupational History   Occupation: retired  Tobacco Use   Smoking status: Former    Current packs/day: 0.00    Average packs/day: 0.5 packs/day for 20.0 years (10.0 ttl pk-yrs)    Types: Cigarettes    Start date: 02/12/1966    Quit date: 11/08/1984    Years since quitting: 39.1  Smokeless tobacco: Never  Vaping Use   Vaping status: Not on file  Substance and Sexual Activity   Alcohol use: No    Alcohol/week: 0.0 standard drinks of alcohol   Drug use: No   Sexual activity: Yes    Birth control/protection: None  Other Topics Concern   Not on file  Social History Narrative   Full time.    Social Drivers of Corporate investment banker Strain: Not on file  Food Insecurity: No Food Insecurity (12/04/2020)   Received from [facility], [facility]   Hunger Vital Sign    Worried About Running Out of Food in the Last Year: Never true    Ran Out of Food in the Last Year: Never true  Transportation Needs: Not on file  Physical Activity: Not on file  Stress: No Stress Concern Present (01/27/2021)   Received from [facility], [facility] of Occupational Health - Occupational Stress Questionnaire    Feeling of Stress : Only a little  Social Connections: Unknown (03/23/2022)   Received from [facility], [facility]   Social Network    Social Network: Not on file  Intimate Partner Violence: Unknown (02/12/2022)   Received from [facility], [facility]    HITS    Physically Hurt: Not on file    Insult or Talk Down To: Not on file    Threaten Physical Harm: Not on file    Scream or Curse: Not on file     Family History  Problem Relation Age of Onset   Hypertension Mother    Kidney disease Mother    Heart disease Father    Kidney disease Other     ROS: Otherwise negative unless mentioned in HPI  Physical Examination  Vitals:   01/03/24 1305  BP: 123/82  Pulse: 76  Resp: 18  Temp: 98.3 F (36.8 C)  SpO2: 100%   Body mass index is 32.64 kg/m.  General:  WDWN in NAD Gait: Not observed HENT: WNL, normocephalic Pulmonary: normal non-labored breathing, without Rales, rhonchi,  wheezing Cardiac: regular, without  Murmurs, rubs or gallops; without carotid bruits Abdomen: Positive bowel sounds throughout, soft, NT/ND, no masses Skin: without rashes Vascular Exam/Pulses: Palpable pulses throughout except in the A/V Graft. No thrill or Bruit felt or heard.  Extremities: without ischemic changes, without Gangrene , without cellulitis; without open wounds;  Musculoskeletal: no muscle wasting or atrophy  Neurologic: A&O X 3;  No focal weakness or paresthesias are detected; speech is fluent/normal Psychiatric:  The pt has Normal affect. Lymph:  Unremarkable  CBC    Component Value Date/Time   WBC 9.9 08/05/2023 1225   RBC 3.30 (L) 08/05/2023 1225   HGB 10.9 (L) 08/05/2023 1225   HCT 33.8 (L) 08/05/2023 1225   PLT 169 08/05/2023 1225   MCV 102.4 (H) 08/05/2023 1225   MCH 33.0 08/05/2023 1225   MCHC 32.2 08/05/2023 1225   RDW 16.4 (H) 08/05/2023 1225   LYMPHSABS 0.7 05/27/2020 0309   MONOABS 1.9 (H) 05/27/2020 0309   EOSABS 0.3 05/27/2020 0309   BASOSABS 0.0 05/27/2020 0309    BMET    Component Value Date/Time   NA 139 08/05/2023 1225   K 4.9 08/05/2023 1225   CL 98 08/05/2023 1225   CO2 23 08/05/2023 1225   GLUCOSE 84 08/05/2023 1225   BUN 78 (H) 08/05/2023 1225   CREATININE 13.23 (H) 08/05/2023 1225    CALCIUM 9.3 08/05/2023 1225   CALCIUM 8.2 (  L) 05/21/2020 0336   GFRNONAA 4 (L) 08/05/2023 1225   GFRAA 5 (L) 06/24/2020 1304    COAGS: Lab Results  Component Value Date   INR 1.1 06/01/2020   INR 1.1 02/26/2020     Non-Invasive Vascular Imaging:   None Ordered  Statin:  Yes.   Beta Blocker:  Yes.   Aspirin:  No. ACEI:  No. ARB:  No. CCB use:  No Other antiplatelets/anticoagulants:  No.    ASSESSMENT/PLAN: This is a 78 y.o. male who presents to [facility] emergency department with a clotted AV left upper extremity fistula.  Patient presented to outpatient dialysis and was unable to be accessed.  He was then sent to the emergency room to be declotted.  Vascular surgery plans on taking the patient to the vascular lab tomorrow on 01/04/2024 for a left upper extremity AV fistulogram with possible intervention and possible dialysis permacatheter placement.  I discussed in detail with the patient in the emergency room this afternoon the procedure, benefits, risk, and complications.  He verbalizes understanding and wishes to proceed to soon as possible.  He also wishes to be dialyzed here at [facility] dialysis unit prior to being discharged.  Answered all the patient's questions this afternoon.  Patient will be made n.p.o. after midnight tonight for procedure tomorrow.   -I discussed the plan in detail with Dr. Festus Barren MD and he is in agreement with the plan.   Marcie Bal [facility] 01/03/2024 2:05 PM

## 2024-01-03 NOTE — ED Provider Notes (Signed)
 [facility] Provider Note    Event Date/Time   First MD Initiated Contact with Patient 01/03/24 1348     (approximate)   History   Vascular Access Problem   HPI Todd Robertson is a 78 y.o. male presenting today for vascular access problem.  Patient was at dialysis today and they were unable to access his fistula.  He was sent here to have access declogged.  Patient denies any pain at the site.  He denies any redness or fevers.  He states he has had this issue in the past and requires vascular surgery to come in and declog it.  Chart review: Patient had vascular surgery visit in October 2024 for thrombosis of his dialysis site.     Physical Exam   Triage Vital Signs: ED Triage Vitals  Encounter Vitals Group     BP 01/03/24 1305 123/82     Systolic BP Percentile --      Diastolic BP Percentile --      Pulse Rate 01/03/24 1305 76     Resp 01/03/24 1305 18     Temp 01/03/24 1305 98.3 F (36.8 C)     Temp Source 01/03/24 1305 Oral     SpO2 01/03/24 1305 100 %     Weight --      Height 01/03/24 1304 6\' 2"  (1.88 m)     Head Circumference --      Peak Flow --      Pain Score 01/03/24 1304 0     Pain Loc --      Pain Education --      Exclude from Growth Chart --     Most recent vital signs: Vitals:   01/03/24 1305  BP: 123/82  Pulse: 76  Resp: 18  Temp: 98.3 F (36.8 C)  SpO2: 100%   I have reviewed the vital signs. General:  Awake, alert, no acute distress. Head:  Normocephalic, Atraumatic. EENT:  PERRL, EOMI, Oral mucosa pink and moist, Neck is supple. Cardiovascular: Regular rate, 2+ distal pulses. Respiratory:  Normal respiratory effort, symmetrical expansion, no distress.   Extremities:  Moving all four extremities through full ROM without pain.   Neuro:  Alert and oriented.  Interacting appropriately.   Skin:  Warm, dry, no rash.  Fistula site in left upper extremity.  No obvious palpable thrill. Psych: Appropriate affect.     ED Results / Procedures / Treatments   Labs (all labs ordered are listed, but only abnormal results are displayed) Labs Reviewed  CBC - Abnormal; Notable for the following components:      Result Value   RBC 3.49 (*)    Hemoglobin 11.5 (*)    HCT 35.2 (*)    MCV 100.9 (*)    RDW 15.9 (*)    All other components within normal limits  BASIC METABOLIC PANEL     EKG    RADIOLOGY    PROCEDURES:  Critical Care performed: No  Procedures   MEDICATIONS ORDERED IN ED: Medications - No data to display   IMPRESSION / MDM / ASSESSMENT AND PLAN / ED COURSE  I reviewed the triage vital signs and the nursing notes.                              Differential diagnosis includes, but is not limited to, thrombosed dialysis access site  Patient's presentation is most consistent with acute complicated illness /  injury requiring diagnostic workup.  Patient is a 78 year old male presenting today over concerns of vascular access problem with his dialysis fistula in the left arm.  They were unable to access it and concern for thrombosis and clog.  Patient with no other acute complaints at this time.  Vital signs otherwise stable.  Do not appreciate a noticeable palpable thrill.  No surrounding erythema or tenderness to the site.  Discussed case with vascular surgery who recommends admission for OR procedure tomorrow to attempt to declog the vascular access site and if fails then he will need a permacath for vascular access.  Patient is agreeable with this plan.  Will admit to hospitalist for further monitoring until procedure tomorrow.  Clinical Course as of 01/03/24 1418  Tue Jan 03, 2024  1403 Spoke with Dr. Wyn Quaker with vascular surgery. He said he can get him into the OR tomorrow morning for procedure. Recommends admission to hospitalist until procedure tomorrow. [DW]    Clinical Course User Index [DW] Janith Lima, MD     FINAL CLINICAL IMPRESSION(S) / ED DIAGNOSES   Final  diagnoses:  Problem with vascular access     Rx / DC Orders   ED Discharge Orders     None        Note:  This document was prepared using Dragon voice recognition software and may include unintentional dictation errors.   Janith Lima, MD 01/03/24 [id number]

## 2024-01-03 NOTE — ED Triage Notes (Signed)
 Patient states he was at dialysis today and they were unable to access his fistula; sent here to have access de-clogged.

## 2024-01-03 NOTE — H&P (Signed)
 History and Physical    Todd Robertson ZOX:[id number] DOB: 11/30/1945 DOA: 01/03/2024  PCP: Kirstie Peri, MD (Confirm with patient/family/NH records and if not entered, this has to be entered at [facility] point of entry) Patient coming from: Home  I have personally briefly reviewed patient's old medical records in [facility] Link  Chief Complaint: it feels AVF does not work  HPI: Todd Robertson is a 78 y.o. male with medical history significant of ESRD on HD TTS, prostate cancer, IIDM, COPD gout, presented with clogged left arm AV fistula.  Last dialysis was last Saturday without any issue.  Sunday, patient woke up and felt that he can no longer feel the vibration of left upper arm AV fistula and this morning, he went to the dialysis center, and the staff there found the patient AV fistula nonfunctional and sent him to ED.  He denied any arm pain no chest pain shortness of breath. ED Course: Afebrile, blood pressure 123/82, O2 saturation 100% on room air.  Blood work showed BUN 82, creatinine 15.5 comparable to his baseline.  Chest x-ray showed chronic interstitial changes otherwise no acute infiltrate.  Review of Systems: As per HPI otherwise 14 point review of systems negative.    Past Medical History:  Diagnosis Date   Arthritis    Cancer (HCC)    prostate   Cardiomyopathy    secondary   CKD (chronic kidney disease)    DM2 (diabetes mellitus, type 2) (HCC)    Gout    HTN (hypertension)    unspec   Hypercholesterolemia    Nonischemic cardiomyopathy (HCC)    a. EF 40-50% by most recent 2-D echo b. EF 25% by cardiac cath in 2004   Overweight(278.02)     Past Surgical History:  Procedure Laterality Date   AV FISTULA PLACEMENT Left 06/06/2020   Procedure: LEFT ARM ARTERIOVENOUS (AV) GRAFT USING 45CM GORTEX;  Surgeon: Larina Earthly, MD;  [facility];  Service: Vascular;  Laterality: Left;   AV FISTULA PLACEMENT Left 06/08/2022   Procedure: INSERTION OF ARTERIOVENOUS GORE-TEX  GRAFT UPPER ARM;  Surgeon: Larina Earthly, MD;  Location: [facility] ORS;  Service: Vascular;  Laterality: Left;   AVGG REMOVAL Left 07/27/2022   Procedure: EXCISION OF INFECTED PORTION OF LEFT FOREARM ARTERIOVENOUS GORETEX GRAFT (AVGG);  Surgeon: Larina Earthly, MD;  Location: [facility] ORS;  Service: Vascular;  Laterality: Left;   COLONOSCOPY     HERNIA REPAIR     INSERTION OF DIALYSIS CATHETER Right 06/06/2020   Procedure: INSERTION OF DIALYSIS CATHETER USING 23CM DOUBLE LUMEN CATHETER;  Surgeon: Larina Earthly, MD;  Location: [facility];  Service: Vascular;  Laterality: Right;   IR ANGIOGRAM EXTREMITY LEFT  10/25/2022   IR FLUORO GUIDE CV LINE RIGHT  05/20/2020   IR THROMBECTOMY AV FISTULA W/THROMBOLYSIS/PTA INC/SHUNT/IMG LEFT Left 10/25/2022   IR THROMBECTOMY AV FISTULA W/THROMBOLYSIS/PTA INC/SHUNT/IMG LEFT Left 08/05/2023   IR US GUIDE VASC ACCESS LEFT  10/25/2022   IR US GUIDE VASC ACCESS LEFT  08/09/2023   IR US GUIDE VASC ACCESS RIGHT  05/20/2020   KNEE ARTHROPLASTY Left 08/08/2017   Procedure: LEFT TOTAL KNEE ARTHROPLASTY WITH COMPUTER NAVIGATION;  Surgeon: Samson Frederic, MD;  [facility];  Service: Orthopedics;  Laterality: Left;  Needs RNFA   KNEE ARTHROPLASTY Right 11/17/2017   Procedure: RIGHT TOTAL KNEE ARTHROPLASTY WITH COMPUTER NAVIGATION;  Surgeon: Samson Frederic, MD;  Location: [facility]S;  Service: Orthopedics;  Laterality: Right;  NEEDS RNFA   PERIPHERAL  VASCULAR THROMBECTOMY Left 08/22/2023   Procedure: PERIPHERAL VASCULAR THROMBECTOMY;  Surgeon: Annice Needy, MD;  Location: [facility] INVASIVE CV LAB;  Service: Cardiovascular;  Laterality: Left;   REMOVAL OF A DIALYSIS CATHETER Right 09/07/2022   Procedure: MINOR REMOVAL OF A TUNNELED DIALYSIS CATHETER;  Surgeon: Larina Earthly, MD;  Location: [facility];  Service: Vascular;  Laterality: Right;   ROBOT ASSISTED LAPAROSCOPIC NEPHRECTOMY Right 05/16/2020   Procedure: XI ROBOTIC ASSISTED LAPAROSCOPIC NEPHRECTOMY;  Surgeon: Malen Gauze, MD;  [facility]: [facility];  Service: Urology;  Laterality: Right;  2.5 hrs     reports that he quit smoking about 39 years ago. His smoking use included cigarettes. He started smoking about 57 years ago. He has a 10 pack-year smoking history. He has never used smokeless tobacco. He reports that he does not drink alcohol and does not use drugs.  No Known Allergies  Family History  Problem Relation Age of Onset   Hypertension Mother    Kidney disease Mother    Heart disease Father    Kidney disease Other      Prior to Admission medications   Medication Sig Start Date End Date Taking? Authorizing Provider  acetaminophen (TYLENOL) 500 MG tablet Take 500-1,000 mg by mouth every 6 (six) hours as needed for moderate pain.   Yes [provider]  albuterol (VENTOLIN HFA) 108 (90 Base) MCG/ACT inhaler Inhale 1-2 puffs into the lungs every 6 (six) hours as needed for wheezing or shortness of breath. Patient taking differently: Inhale 1-2 puffs into the lungs 2 (two) times daily. 06/24/20  Yes Angiulli, Mcarthur Rossetti, PA-C  allopurinol (ZYLOPRIM) 300 MG tablet Take 300 mg by mouth daily. 10/21/20  Yes [provider]  atorvastatin (LIPITOR) 40 MG tablet Take 1 tablet (40 mg total) by mouth daily. Patient taking differently: Take 40 mg by mouth every Monday, Wednesday, and Friday. 06/24/20  Yes Angiulli, Mcarthur Rossetti, PA-C  b complex-vitamin c-folic acid (NEPHRO-VITE) 0.8 MG TABS tablet Take 1 tablet by mouth at bedtime.   Yes [provider]  BREO ELLIPTA 100-25 MCG/ACT AEPB Inhale 1 puff into the lungs daily. 05/18/22  Yes [provider]  calcium acetate (PHOSLO) 667 MG capsule Take 3 capsules (2,001 mg total) by mouth 3 (three) times daily with meals. 06/24/20  Yes Angiulli, Mcarthur Rossetti, PA-C  cetirizine (ZYRTEC) 10 MG tablet Take 10 mg by mouth daily.   Yes [provider]  Cholecalciferol (VITAMIN D) 50 MCG (2000 UT) tablet Take 1 tablet (2,000 Units total) by mouth daily. 06/24/20  Yes  Angiulli, Mcarthur Rossetti, PA-C  Colchicine 0.6 MG CAPS Take 1 capsule by mouth 2 (two) times daily. 12/08/23  Yes [provider]  methocarbamol (ROBAXIN) 500 MG tablet Take 500 mg by mouth 3 (three) times a week. Take Sun, Tue, and Thurs (day before dialysis) 05/11/22  Yes [provider]  metoprolol tartrate (LOPRESSOR) 25 MG tablet Take 0.5 tablets (12.5 mg total) by mouth 2 (two) times daily. Patient taking differently: Take 12.5 mg by mouth 2 (two) times daily. after dialysis 07/16/20  Yes Netta Neat., NP  midodrine (PROAMATINE) 10 MG tablet TAKE 1 TABLET BY MOUTH THREE TIMES DAILY WITH MEALS 10/18/23  Yes Branch, Dorothe Pea, MD  Multiple Vitamins-Minerals (CENTRUM SILVER 50+MEN) TABS Take 1 tablet by mouth daily.   Yes [provider]  Naphazoline HCl (CLEAR EYES OP) Place 1 drop into both eyes daily.   Yes [provider]  Polyethylene Glycol  3350 (MIRALAX PO) Take 17 g by mouth daily as needed (constipation).   Yes [provider]  pramipexole (MIRAPEX) 0.5 MG tablet Take 0.5 mg by mouth at bedtime. 12/23/23  Yes [provider]  senna (SENOKOT) 8.6 MG tablet Take 1-2 tablets by mouth daily as needed for constipation.   Yes [provider]  tamsulosin (FLOMAX) 0.4 MG CAPS capsule Take 1 capsule (0.4 mg total) by mouth daily. 12/29/23  Yes Bjorn Pippin, MD  trolamine salicylate (ASPERCREME) 10 % cream Apply 1 Application topically as needed for muscle pain.   Yes [provider]    Physical Exam: Vitals:   01/03/24 1304 01/03/24 1305 01/03/24 1610  BP:  123/82 120/80  Pulse:  76 77  Resp:  18 16  Temp:  98.3 F (36.8 C) 98.2 F (36.8 C)  TempSrc:  Oral Oral  SpO2:  100% 97%  Height: 6\' 2"  (1.88 m)      Constitutional: NAD, calm, comfortable Vitals:   01/03/24 1304 01/03/24 1305 01/03/24 1610  BP:  123/82 120/80  Pulse:  76 77  Resp:  18 16  Temp:  98.3 F (36.8 C) 98.2 F (36.8 C)  TempSrc:  Oral Oral   SpO2:  100% 97%  Height: 6\' 2"  (1.88 m)     Eyes: PERRL, lids and conjunctivae normal ENMT: Mucous membranes are moist. Posterior pharynx clear of any exudate or lesions.Normal dentition.  Neck: normal, supple, no masses, no thyromegaly Respiratory: clear to auscultation bilaterally, no wheezing, no crackles. Normal respiratory effort. No accessory muscle use.  Cardiovascular: Regular rate and rhythm, no murmurs / rubs / gallops. No extremity edema. 2+ pedal pulses. No carotid bruits.  No bruits or thrills appreciated on left upper arm AV fistula Abdomen: no tenderness, no masses palpated. No hepatosplenomegaly. Bowel sounds positive.  Musculoskeletal: no clubbing / cyanosis. No joint deformity upper and lower extremities. Good ROM, no contractures. Normal muscle tone.  Skin: no rashes, lesions, ulcers. No induration Neurologic: CN 2-12 grossly intact. Sensation intact, DTR normal. Strength 5/5 in all 4.  Psychiatric: Normal judgment and insight. Alert and oriented x 3. Normal mood.     Labs on Admission: I have personally reviewed following labs and imaging studies  CBC: Recent Labs  Lab 01/03/24 1408  WBC 6.7  HGB 11.5*  HCT 35.2*  MCV 100.9*  PLT 218   Basic Metabolic Panel: Recent Labs  Lab 01/03/24 1408  NA 140  K 4.9  CL 101  CO2 24  GLUCOSE 78  BUN 82*  CREATININE 15.59*  CALCIUM 9.5   GFR: CrCl cannot be calculated (Unknown ideal weight.). Liver Function Tests: No results for input(s): "AST", "ALT", "ALKPHOS", "BILITOT", "PROT", "ALBUMIN" in the last 168 hours. No results for input(s): "LIPASE", "AMYLASE" in the last 168 hours. No results for input(s): "AMMONIA" in the last 168 hours. Coagulation Profile: No results for input(s): "INR", "PROTIME" in the last 168 hours. Cardiac Enzymes: No results for input(s): "CKTOTAL", "CKMB", "CKMBINDEX", "TROPONINI" in the last 168 hours. BNP (last 3 results) No results for input(s): "PROBNP" in the last 8760  hours. HbA1C: No results for input(s): "HGBA1C" in the last 72 hours. CBG: No results for input(s): "GLUCAP" in the last 168 hours. Lipid Profile: No results for input(s): "CHOL", "HDL", "LDLCALC", "TRIG", "CHOLHDL", "LDLDIRECT" in the last 72 hours. Thyroid Function Tests: No results for input(s): "TSH", "T4TOTAL", "FREET4", "T3FREE", "THYROIDAB" in the last 72 hours. Anemia Panel: No results for input(s): "VITAMINB12", "FOLATE", "FERRITIN", "TIBC", "IRON", "  RETICCTPCT" in the last 72 hours. Urine analysis:    Component Value Date/Time   APPEARANCEUR Clear 12/30/2022 1104   GLUCOSEU Negative 12/30/2022 1104   BILIRUBINUR Negative 12/30/2022 1104   PROTEINUR 2+ (A) 12/30/2022 1104   UROBILINOGEN 0.2 02/13/2020 1122   NITRITE Negative 12/30/2022 1104   LEUKOCYTESUR Negative 12/30/2022 1104    Radiological Exams on Admission: No results found.  EKG: None  Assessment/Plan Principal Problem:   AV fistula occlusion [facility]) Active Problems:   Arteriovenous fistula occlusion (HCC)  (please populate well all problems here in Problem List. (For example, if patient is on BP meds at home and you resume or decide to hold them, it is a problem that needs to be her. Same for CAD, COPD, HLD and so on)  Left arm AV fistula malfunction -Likely clogged by thrombos -Vascular surgeon consulted, n.p.o. after midnight, declotting tomorrow versus temporary HD catheter insertion.  Also notified nephrology for possible dialysis tomorrow.  ESRD on HD -As above  HTN -Stable, continue metoprolol  COPD -Stable, continue as needed breathing meds  DVT prophylaxis: Heparin subcu Code Status: Full code Family Communication: Wife at bedside Disposition Plan: Expect less than 2 midnight hospital stay Consults called: Vascular surgery, nephrology Admission status: MedSurg observation   Emeline General MD [facility] Pager [phone]  01/03/2024, 4:39 PM

## 2024-01-03 NOTE — H&P (View-Only) (Signed)
 Hospital Consult    Reason for Consult:  Non functioning A/V Fistula Dialysis Access Requesting Physician:  Dr Claudell Kyle MD  MRN #:  [id number]  History of Present Illness: This is a 78 y.o. male who presents to [facility] emergency department after he was unable to be accessed at dialysis outpatient today.  He was told that his fistula was clogged and needed to be declogged prior to attempting dialysis again.  Patient endorses he lives in [place] and receives dialysis access there but would like dialysis here before leaving the hospital.  Patient endorses he needs to take midodrine twice before dialysis for chronic low blood pressure.  Denies any pain or difficulties with his left arm.  No complaints at this time.  Vitals all remained stable.  Past Medical History:  Diagnosis Date   Arthritis    Cancer (HCC)    prostate   Cardiomyopathy    secondary   CKD (chronic kidney disease)    DM2 (diabetes mellitus, type 2) (HCC)    Gout    HTN (hypertension)    unspec   Hypercholesterolemia    Nonischemic cardiomyopathy (HCC)    a. EF 40-50% by most recent 2-D echo b. EF 25% by cardiac cath in 2004   Overweight(278.02)     Past Surgical History:  Procedure Laterality Date   AV FISTULA PLACEMENT Left 06/06/2020   Procedure: LEFT ARM ARTERIOVENOUS (AV) GRAFT USING 45CM GORTEX;  Surgeon: Larina Earthly, MD;  [facility] OR;  Service: Vascular;  Laterality: Left;   AV FISTULA PLACEMENT Left 06/08/2022   Procedure: INSERTION OF ARTERIOVENOUS GORE-TEX GRAFT UPPER ARM;  Surgeon: Larina Earthly, MD;  Location: [facility] ORS;  Service: Vascular;  Laterality: Left;   AVGG REMOVAL Left 07/27/2022   Procedure: EXCISION OF INFECTED PORTION OF LEFT FOREARM ARTERIOVENOUS GORETEX GRAFT (AVGG);  Surgeon: Larina Earthly, MD;  Location: [facility];  Service: Vascular;  Laterality: Left;   COLONOSCOPY     HERNIA REPAIR     INSERTION OF DIALYSIS CATHETER Right 06/06/2020   Procedure: INSERTION OF DIALYSIS CATHETER  USING 23CM DOUBLE LUMEN CATHETER;  Surgeon: Larina Earthly, MD;  [facility]: [facility];  Service: Vascular;  Laterality: Right;   IR ANGIOGRAM EXTREMITY LEFT  10/25/2022   IR FLUORO GUIDE CV LINE RIGHT  05/20/2020   IR THROMBECTOMY AV FISTULA W/THROMBOLYSIS/PTA INC/SHUNT/IMG LEFT Left 10/25/2022   IR THROMBECTOMY AV FISTULA W/THROMBOLYSIS/PTA INC/SHUNT/IMG LEFT Left 08/05/2023   IR US GUIDE VASC ACCESS LEFT  10/25/2022   IR US GUIDE VASC ACCESS LEFT  08/09/2023   IR US GUIDE VASC ACCESS RIGHT  05/20/2020   KNEE ARTHROPLASTY Left 08/08/2017   Procedure: LEFT TOTAL KNEE ARTHROPLASTY WITH COMPUTER NAVIGATION;  Surgeon: Samson Frederic, MD;  [facility];  Service: Orthopedics;  Laterality: Left;  Needs RNFA   KNEE ARTHROPLASTY Right 11/17/2017   Procedure: RIGHT TOTAL KNEE ARTHROPLASTY WITH COMPUTER NAVIGATION;  Surgeon: Samson Frederic, MD;  Location: [facility];  Service: Orthopedics;  Laterality: Right;  NEEDS RNFA   PERIPHERAL VASCULAR THROMBECTOMY Left 08/22/2023   Procedure: PERIPHERAL VASCULAR THROMBECTOMY;  Surgeon: Annice Needy, MD;  Location: [facility] INVASIVE CV LAB;  Service: Cardiovascular;  Laterality: Left;   REMOVAL OF A DIALYSIS CATHETER Right 09/07/2022   Procedure: MINOR REMOVAL OF A TUNNELED DIALYSIS CATHETER;  Surgeon: Larina Earthly, MD;  Location: [facility];  Service: Vascular;  Laterality: Right;   ROBOT ASSISTED LAPAROSCOPIC NEPHRECTOMY Right 05/16/2020   Procedure: XI ROBOTIC ASSISTED LAPAROSCOPIC NEPHRECTOMY;  Surgeon: Malen Gauze, MD;  Location: [facility];  Service: Urology;  Laterality: Right;  2.5 hrs    No Known Allergies  Prior to Admission medications   Medication Sig Start Date End Date Taking? Authorizing Provider  acetaminophen (TYLENOL) 500 MG tablet Take 500-1,000 mg by mouth every 6 (six) hours as needed for moderate pain.    [provider]  albuterol (VENTOLIN HFA) 108 (90 Base) MCG/ACT inhaler Inhale 1-2 puffs into the lungs every 6 (six) hours as needed for  wheezing or shortness of breath. Patient taking differently: Inhale 1-2 puffs into the lungs 2 (two) times daily. 06/24/20   Angiulli, Mcarthur Rossetti, PA-C  allopurinol (ZYLOPRIM) 300 MG tablet Take 300 mg by mouth daily. 10/21/20   [provider]  atorvastatin (LIPITOR) 40 MG tablet Take 1 tablet (40 mg total) by mouth daily. Patient taking differently: Take 40 mg by mouth at bedtime. 06/24/20   Angiulli, Mcarthur Rossetti, PA-C  b complex-vitamin c-folic acid (NEPHRO-VITE) 0.8 MG TABS tablet Take 1 tablet by mouth at bedtime.    [provider]  BREO ELLIPTA 100-25 MCG/ACT AEPB Inhale 1 puff into the lungs daily. 05/18/22   [provider]  calcium acetate (PHOSLO) 667 MG capsule Take 3 capsules (2,001 mg total) by mouth 3 (three) times daily with meals. 06/24/20   Angiulli, Mcarthur Rossetti, PA-C  cetirizine (ZYRTEC) 10 MG tablet Take 10 mg by mouth daily.    [provider]  Cholecalciferol (VITAMIN D) 50 MCG (2000 UT) tablet Take 1 tablet (2,000 Units total) by mouth daily. 06/24/20   Angiulli, Mcarthur Rossetti, PA-C  methocarbamol (ROBAXIN) 500 MG tablet Take 500 mg by mouth 3 (three) times a week. Take Sun, Tue, and Thurs (day before dialysis) 05/11/22   [provider]  metoprolol tartrate (LOPRESSOR) 25 MG tablet Take 0.5 tablets (12.5 mg total) by mouth 2 (two) times daily. Patient taking differently: Take 12.5 mg by mouth 2 (two) times daily. after dialysis 07/16/20   Netta Neat., NP  midodrine (PROAMATINE) 10 MG tablet TAKE 1 TABLET BY MOUTH THREE TIMES DAILY WITH MEALS 10/18/23   Antoine Poche, MD  Multiple Vitamins-Minerals (CENTRUM SILVER 50+MEN) TABS Take 1 tablet by mouth daily.    [provider]  Naphazoline HCl (CLEAR EYES OP) Place 1 drop into both eyes daily.    [provider]  Polyethylene Glycol 3350 (MIRALAX PO) Take 17 g by mouth daily as needed (constipation).    [provider]  senna (SENOKOT) 8.6 MG tablet Take 1-2 tablets  by mouth daily as needed for constipation.    [provider]  tamsulosin (FLOMAX) 0.4 MG CAPS capsule Take 1 capsule (0.4 mg total) by mouth daily. 12/29/23   Bjorn Pippin, MD  trolamine salicylate (ASPERCREME) 10 % cream Apply 1 Application topically as needed for muscle pain.    [provider]    Social History   Socioeconomic History   Marital status: Married    Spouse name: Not on file   Number of children: 2   Years of education: Not on file   Highest education level: Not on file  Occupational History   Occupation: retired  Tobacco Use   Smoking status: Former    Current packs/day: 0.00    Average packs/day: 0.5 packs/day for 20.0 years (10.0 ttl pk-yrs)    Types: Cigarettes    Start date: 02/12/1966    Quit date: 11/08/1984    Years since quitting: 39.1  Smokeless tobacco: Never  Vaping Use   Vaping status: Not on file  Substance and Sexual Activity   Alcohol use: No    Alcohol/week: 0.0 standard drinks of alcohol   Drug use: No   Sexual activity: Yes    Birth control/protection: None  Other Topics Concern   Not on file  Social History Narrative   Full time.    Social Drivers of Corporate investment banker Strain: Not on file  Food Insecurity: No Food Insecurity (12/04/2020)   Received from [facility], [facility]   Hunger Vital Sign    Worried About Running Out of Food in the Last Year: Never true    Ran Out of Food in the Last Year: Never true  Transportation Needs: Not on file  Physical Activity: Not on file  Stress: No Stress Concern Present (01/27/2021)   Received from [facility], [facility] of Occupational Health - Occupational Stress Questionnaire    Feeling of Stress : Only a little  Social Connections: Unknown (03/23/2022)   Received from [facility], [facility]   Social Network    Social Network: Not on file  Intimate Partner Violence: Unknown (02/12/2022)   Received from [facility], [facility]    HITS    Physically Hurt: Not on file    Insult or Talk Down To: Not on file    Threaten Physical Harm: Not on file    Scream or Curse: Not on file     Family History  Problem Relation Age of Onset   Hypertension Mother    Kidney disease Mother    Heart disease Father    Kidney disease Other     ROS: Otherwise negative unless mentioned in HPI  Physical Examination  Vitals:   01/03/24 1305  BP: 123/82  Pulse: 76  Resp: 18  Temp: 98.3 F (36.8 C)  SpO2: 100%   Body mass index is 32.64 kg/m.  General:  WDWN in NAD Gait: Not observed HENT: WNL, normocephalic Pulmonary: normal non-labored breathing, without Rales, rhonchi,  wheezing Cardiac: regular, without  Murmurs, rubs or gallops; without carotid bruits Abdomen: Positive bowel sounds throughout, soft, NT/ND, no masses Skin: without rashes Vascular Exam/Pulses: Palpable pulses throughout except in the A/V Graft. No thrill or Bruit felt or heard.  Extremities: without ischemic changes, without Gangrene , without cellulitis; without open wounds;  Musculoskeletal: no muscle wasting or atrophy  Neurologic: A&O X 3;  No focal weakness or paresthesias are detected; speech is fluent/normal Psychiatric:  The pt has Normal affect. Lymph:  Unremarkable  CBC    Component Value Date/Time   WBC 9.9 08/05/2023 1225   RBC 3.30 (L) 08/05/2023 1225   HGB 10.9 (L) 08/05/2023 1225   HCT 33.8 (L) 08/05/2023 1225   PLT 169 08/05/2023 1225   MCV 102.4 (H) 08/05/2023 1225   MCH 33.0 08/05/2023 1225   MCHC 32.2 08/05/2023 1225   RDW 16.4 (H) 08/05/2023 1225   LYMPHSABS 0.7 05/27/2020 0309   MONOABS 1.9 (H) 05/27/2020 0309   EOSABS 0.3 05/27/2020 0309   BASOSABS 0.0 05/27/2020 0309    BMET    Component Value Date/Time   NA 139 08/05/2023 1225   K 4.9 08/05/2023 1225   CL 98 08/05/2023 1225   CO2 23 08/05/2023 1225   GLUCOSE 84 08/05/2023 1225   BUN 78 (H) 08/05/2023 1225   CREATININE 13.23 (H) 08/05/2023 1225    CALCIUM 9.3 08/05/2023 1225   CALCIUM 8.2 (  L) 05/21/2020 0336   GFRNONAA 4 (L) 08/05/2023 1225   GFRAA 5 (L) 06/24/2020 1304    COAGS: Lab Results  Component Value Date   INR 1.1 06/01/2020   INR 1.1 02/26/2020     Non-Invasive Vascular Imaging:   None Ordered  Statin:  Yes.   Beta Blocker:  Yes.   Aspirin:  No. ACEI:  No. ARB:  No. CCB use:  No Other antiplatelets/anticoagulants:  No.    ASSESSMENT/PLAN: This is a 78 y.o. male who presents to [facility] emergency department with a clotted AV left upper extremity fistula.  Patient presented to outpatient dialysis and was unable to be accessed.  He was then sent to the emergency room to be declotted.  Vascular surgery plans on taking the patient to the vascular lab tomorrow on 01/04/2024 for a left upper extremity AV fistulogram with possible intervention and possible dialysis permacatheter placement.  I discussed in detail with the patient in the emergency room this afternoon the procedure, benefits, risk, and complications.  He verbalizes understanding and wishes to proceed to soon as possible.  He also wishes to be dialyzed here at [facility] dialysis unit prior to being discharged.  Answered all the patient's questions this afternoon.  Patient will be made n.p.o. after midnight tonight for procedure tomorrow.   -I discussed the plan in detail with Dr. Festus Barren MD and he is in agreement with the plan.   Marcie Bal [facility] 01/03/2024 2:05 PM

## 2024-01-04 ENCOUNTER — Encounter: Payer: Self-pay | Admitting: Vascular Surgery

## 2024-01-04 ENCOUNTER — Encounter
Admission: EM | Disposition: A | Discharge status: Home / Self Care | Payer: Self-pay | Source: Home / Self Care | Attending: Emergency Medicine

## 2024-01-04 DIAGNOSIS — T82898A Other specified complication of vascular prosthetic devices, implants and grafts, initial encounter: Secondary | ICD-10-CM | POA: Diagnosis not present

## 2024-01-04 DIAGNOSIS — Z992 Dependence on renal dialysis: Secondary | ICD-10-CM | POA: Diagnosis not present

## 2024-01-04 DIAGNOSIS — T82868A Thrombosis of vascular prosthetic devices, implants and grafts, initial encounter: Secondary | ICD-10-CM

## 2024-01-04 DIAGNOSIS — N186 End stage renal disease: Secondary | ICD-10-CM | POA: Diagnosis not present

## 2024-01-04 DIAGNOSIS — T82858A Stenosis of vascular prosthetic devices, implants and grafts, initial encounter: Secondary | ICD-10-CM | POA: Diagnosis not present

## 2024-01-04 HISTORY — PX: A/V FISTULAGRAM: CATH118298

## 2024-01-04 HISTORY — PX: DIALYSIS/PERMA CATHETER INSERTION: CATH118288

## 2024-01-04 LAB — CBC
HCT: 32.3 % — ABNORMAL LOW (ref 39.0–52.0)
Hemoglobin: 10.6 g/dL — ABNORMAL LOW (ref 13.0–17.0)
MCH: 32.5 pg (ref 26.0–34.0)
MCHC: 32.8 g/dL (ref 30.0–36.0)
MCV: 99.1 fL (ref 80.0–100.0)
Platelets: 211 10*3/uL (ref 150–400)
RBC: 3.26 MIL/uL — ABNORMAL LOW (ref 4.22–5.81)
RDW: 15.5 % (ref 11.5–15.5)
WBC: 6.2 10*3/uL (ref 4.0–10.5)
nRBC: 0 % (ref 0.0–0.2)

## 2024-01-04 LAB — BASIC METABOLIC PANEL
Anion gap: 14 (ref 5–15)
BUN: 93 mg/dL — ABNORMAL HIGH (ref 8–23)
CO2: 23 mmol/L (ref 22–32)
Calcium: 9.2 mg/dL (ref 8.9–10.3)
Chloride: 103 mmol/L (ref 98–111)
Creatinine, Ser: 16.54 mg/dL — ABNORMAL HIGH (ref 0.61–1.24)
GFR, Estimated: 3 mL/min — ABNORMAL LOW (ref >60)
Glucose, Bld: 74 mg/dL (ref 70–99)
Potassium: 4.7 mmol/L (ref 3.5–5.1)
Sodium: 140 mmol/L (ref 135–145)

## 2024-01-04 SURGERY — A/V FISTULAGRAM
Anesthesia: Moderate Sedation | Laterality: Right

## 2024-01-04 MED ORDER — FAMOTIDINE 20 MG PO TABS: 40.0000 mg | ORAL_TABLET | Freq: Once | ORAL | Status: DC | PRN

## 2024-01-04 MED ORDER — METHYLPREDNISOLONE SODIUM SUCC 125 MG IJ SOLR: 125.0000 mg | Freq: Once | INTRAMUSCULAR | Status: DC | PRN

## 2024-01-04 MED ORDER — IODIXANOL 320 MG/ML IV SOLN
INTRAVENOUS | Status: DC | PRN
  Administered 2024-01-04: 25 mL

## 2024-01-04 MED ORDER — HEPARIN (PORCINE) IN NACL 1000-0.9 UT/500ML-% IV SOLN
INTRAVENOUS | Status: DC | PRN
  Administered 2024-01-04: 500 mL

## 2024-01-04 MED ORDER — FENTANYL CITRATE (PF) 100 MCG/2ML IJ SOLN
INTRAMUSCULAR | Status: AC
  Filled 2024-01-04: qty 2

## 2024-01-04 MED ORDER — MIDAZOLAM HCL 5 MG/5ML IJ SOLN
INTRAMUSCULAR | Status: AC
  Filled 2024-01-04: qty 5

## 2024-01-04 MED ORDER — FENTANYL CITRATE PF 50 MCG/ML IJ SOSY: 12.5000 ug | PREFILLED_SYRINGE | Freq: Once | INTRAMUSCULAR | Status: DC | PRN

## 2024-01-04 MED ORDER — LIDOCAINE-EPINEPHRINE (PF) 1 %-1:200000 IJ SOLN
INTRAMUSCULAR | Status: DC | PRN
  Administered 2024-01-04: 10 mL

## 2024-01-04 MED ORDER — HEPARIN SODIUM (PORCINE) 1000 UNIT/ML IJ SOLN
INTRAMUSCULAR | Status: AC
Start: 2024-01-04 — End: ?
  Filled 2024-01-04: qty 10

## 2024-01-04 MED ORDER — COLCHICINE 0.6 MG PO CAPS: 1.0000 | ORAL_CAPSULE | Freq: Two times a day (BID) | ORAL | Status: DC | PRN

## 2024-01-04 MED ORDER — CEFAZOLIN SODIUM-DEXTROSE 1-4 GM/50ML-% IV SOLN
INTRAVENOUS | Status: AC
  Filled 2024-01-04: qty 50

## 2024-01-04 MED ORDER — ONDANSETRON HCL 4 MG/2ML IJ SOLN: 4.0000 mg | Freq: Four times a day (QID) | INTRAMUSCULAR | Status: DC | PRN

## 2024-01-04 MED ORDER — PENTAFLUOROPROP-TETRAFLUOROETH EX AERO
INHALATION_SPRAY | CUTANEOUS | Status: AC
  Filled 2024-01-04: qty 30

## 2024-01-04 MED ORDER — HEPARIN SODIUM (PORCINE) 1000 UNIT/ML IJ SOLN
INTRAMUSCULAR | Status: DC | PRN
  Administered 2024-01-04: 4000 [IU] via INTRAVENOUS

## 2024-01-04 MED ORDER — MIDAZOLAM HCL 2 MG/2ML IJ SOLN
INTRAMUSCULAR | Status: DC | PRN
  Administered 2024-01-04: .5 mg via INTRAVENOUS
  Administered 2024-01-04: 2 mg via INTRAVENOUS

## 2024-01-04 MED ORDER — SODIUM CHLORIDE 0.9 % IV SOLN: INTRAVENOUS | Status: DC

## 2024-01-04 MED ORDER — MIDAZOLAM HCL 2 MG/ML PO SYRP: 8.0000 mg | ORAL_SOLUTION | Freq: Once | ORAL | Status: DC | PRN

## 2024-01-04 MED ORDER — CEFAZOLIN SODIUM-DEXTROSE 1-4 GM/50ML-% IV SOLN
1.0000 g | INTRAVENOUS | Status: AC
  Administered 2024-01-04: 1 g via INTRAVENOUS

## 2024-01-04 MED ORDER — ALBUTEROL SULFATE (2.5 MG/3ML) 0.083% IN NEBU: 2.5000 mg | INHALATION_SOLUTION | RESPIRATORY_TRACT | Status: DC | PRN

## 2024-01-04 MED ORDER — DIPHENHYDRAMINE HCL 50 MG/ML IJ SOLN: 50.0000 mg | Freq: Once | INTRAMUSCULAR | Status: DC | PRN

## 2024-01-04 MED ORDER — FENTANYL CITRATE (PF) 100 MCG/2ML IJ SOLN
INTRAMUSCULAR | Status: DC | PRN
  Administered 2024-01-04: 50 ug via INTRAVENOUS
  Administered 2024-01-04: 25 ug via INTRAVENOUS

## 2024-01-04 SURGICAL SUPPLY — 9 items
CATH EMBOLECTOMY 5FR (BALLOONS) IMPLANT
CATH THROMBEC 7F 65 CLEANER15 (CATHETERS) IMPLANT
COVER PROBE ULTRASOUND 5X96 (MISCELLANEOUS) IMPLANT
DRAPE BRACHIAL (DRAPES) IMPLANT
PACK ANGIOGRAPHY (CUSTOM PROCEDURE TRAY) ×2 IMPLANT
SHEATH BRITE TIP 6FRX5.5 (SHEATH) IMPLANT
SHEATH BRITE TIP 7FRX5.5 (SHEATH) IMPLANT
SUT MNCRL AB 4-0 PS2 18 (SUTURE) IMPLANT
WIRE SUPRACORE 190CM (WIRE) IMPLANT

## 2024-01-04 NOTE — Plan of Care (Signed)

## 2024-01-04 NOTE — Discharge Summary (Signed)
 Physician Discharge Summary   Todd Robertson  male DOB: [birth year]  ZOX:[id number]  PCP: Kirstie Peri, MD  Admit date: 01/03/2024 Discharge date: 01/04/2024  Admitted From: home Disposition:  home CODE STATUS: Full code   Hospital Course:  For full details, please see H&P, progress notes, consult notes and ancillary notes.  Briefly,  Todd Robertson is a 78 y.o. male with medical history significant of ESRD on HD TTS, prostate cancer, IIDM, COPD, presented with clogged left arm AV fistula.   Left arm AV fistula malfunction --pt was found to have Thrombosed left brachial artery to axillary vein arteriovenous graft, and underwent Mechanical thrombectomy with Dr. Wyn Quaker.  After the procedure, pt was able to receive HD via the fistula, and was cleared for discharge by nephro.   ESRD on HD --received inpatient HD after vascular thrombectomy and tolerated well.     HTN, not currently active --discontinue home metop due to low BP.  Cont home midodrine.   COPD -Stable   Discharge Diagnoses:  Principal Problem:   AV fistula occlusion [facility]) Active Problems:   Arteriovenous fistula occlusion [facility])     Discharge Instructions:  Allergies as of 01/04/2024   No Known Allergies      Medication List     STOP taking these medications    metoprolol tartrate 25 MG tablet Commonly known as: LOPRESSOR       TAKE these medications    acetaminophen 500 MG tablet Commonly known as: TYLENOL Take 500-1,000 mg by mouth every 6 (six) hours as needed for moderate pain.   albuterol 108 (90 Base) MCG/ACT inhaler Commonly known as: VENTOLIN HFA Inhale 1-2 puffs into the lungs every 6 (six) hours as needed for wheezing or shortness of breath. What changed: when to take this   allopurinol 300 MG tablet Commonly known as: ZYLOPRIM Take 300 mg by mouth daily.   atorvastatin 40 MG tablet Commonly known as: LIPITOR Take 1 tablet (40 mg total) by mouth daily. What changed: when  to take this   b complex-vitamin c-folic acid 0.8 MG Tabs tablet Take 1 tablet by mouth at bedtime.   Breo Ellipta 100-25 MCG/ACT Aepb Generic drug: fluticasone furoate-vilanterol Inhale 1 puff into the lungs daily.   calcium acetate 667 MG capsule Commonly known as: PHOSLO Take 3 capsules (2,001 mg total) by mouth 3 (three) times daily with meals.   Centrum Silver 50+Men Tabs Take 1 tablet by mouth daily.   cetirizine 10 MG tablet Commonly known as: ZYRTEC Take 10 mg by mouth daily.   CLEAR EYES OP Place 1 drop into both eyes daily.   Colchicine 0.6 MG Caps Take 1 capsule (0.6 mg total) by mouth 2 (two) times daily as needed. Home med. What changed:  when to take this reasons to take this additional instructions   methocarbamol 500 MG tablet Commonly known as: ROBAXIN Take 500 mg by mouth 3 (three) times a week. Take Sun, Tue, and Thurs (day before dialysis)   midodrine 10 MG tablet Commonly known as: PROAMATINE TAKE 1 TABLET BY MOUTH THREE TIMES DAILY WITH MEALS   MIRALAX PO Take 17 g by mouth daily as needed (constipation).   pramipexole 0.5 MG tablet Commonly known as: MIRAPEX Take 0.5 mg by mouth at bedtime.   senna 8.6 MG tablet Commonly known as: SENOKOT Take 1-2 tablets by mouth daily as needed for constipation.   tamsulosin 0.4 MG Caps capsule Commonly known as: FLOMAX Take 1 capsule (0.4 mg total) by  mouth daily.   trolamine salicylate 10 % cream Commonly known as: ASPERCREME Apply 1 Application topically as needed for muscle pain.   Vitamin D 50 MCG (2000 UT) tablet Take 1 tablet (2,000 Units total) by mouth daily.         Follow-up Information     Kirstie Peri, MD Follow up in 1 week(s).   Specialty: Internal Medicine Contact information: [address]  [place] [address] [phone]                 No Known Allergies   The results of significant diagnostics from this hospitalization (including imaging, microbiology,  ancillary and laboratory) are listed below for reference.   Consultations:   Procedures/Studies: PERIPHERAL VASCULAR CATHETERIZATION Result Date: 01/04/2024 See surgical note for result.  DG Chest 1 View Result Date: 01/03/2024 CLINICAL DATA:  End-stage renal disease EXAM: CHEST  1 VIEW COMPARISON:  Chest x-ray 06/03/2023 FINDINGS: Heart is mildly enlarged. Both lungs are clear. The visualized skeletal structures are unremarkable. IMPRESSION: Mild cardiomegaly. No active disease. Electronically Signed   By: Darliss Cheney M.D.   On: 01/03/2024 16:53   US RENAL Result Date: 12/28/2023 : PROCEDURE: US RENAL HISTORY: Patient is a 78 y/o M with history of right nephrectomy 05/16/2020 due to cancer. On dialysis. Hypertension. Diabetes. Chronic kidney disease. COMPARISON: CT renal 08/19/2021, U/S renal 02/02/2021. TECHNIQUE: Two-dimensional grayscale and color Doppler ultrasound of the kidneys was performed. FINDINGS: The urinary bladder is partially distended. The right kidney is surgically absent. The right renal fossa appears unremarkable. The left kidney measures 10.0 x 5.4 x 4.6 cm. Renal cortical echotexture is mildly increased. Calcifications are noted within the cortex. There is no hydronephrosis. There are no stones. There are multiple simple cysts with the largest measuring 2.4 cm at the medial inferior pole. IMPRESSION: 1.  Surgically absent right kidney. 2. Multiple left renal cysts. Mild increase in echogenicity of the left kidney, consistent with medical renal disease. Thank you for allowing [place] to assist in the care of this patient. Electronically Signed   By: Lestine Box M.D.   On: 12/28/2023 12:27      Labs: BNP (last 3 results) No results for input(s): "BNP" in the last 8760 hours. Basic Metabolic Panel: Recent Labs  Lab 01/03/24 1408 01/04/24 0552  NA 140 140  K 4.9 4.7  CL 101 103  CO2 24 23  GLUCOSE 78 74  BUN 82* 93*  CREATININE 15.59* 16.54*  CALCIUM 9.5 9.2   Liver  Function Tests: No results for input(s): "AST", "ALT", "ALKPHOS", "BILITOT", "PROT", "ALBUMIN" in the last 168 hours. No results for input(s): "LIPASE", "AMYLASE" in the last 168 hours. No results for input(s): "AMMONIA" in the last 168 hours. CBC: Recent Labs  Lab 01/03/24 1408 01/04/24 1504  WBC 6.7 6.2  HGB 11.5* 10.6*  HCT 35.2* 32.3*  MCV 100.9* 99.1  PLT 218 211   Cardiac Enzymes: No results for input(s): "CKTOTAL", "CKMB", "CKMBINDEX", "TROPONINI" in the last 168 hours. BNP: Invalid input(s): "POCBNP" CBG: No results for input(s): "GLUCAP" in the last 168 hours. D-Dimer No results for input(s): "DDIMER" in the last 72 hours. Hgb A1c No results for input(s): "HGBA1C" in the last 72 hours. Lipid Profile No results for input(s): "CHOL", "HDL", "LDLCALC", "TRIG", "CHOLHDL", "LDLDIRECT" in the last 72 hours. Thyroid function studies No results for input(s): "TSH", "T4TOTAL", "T3FREE", "THYROIDAB" in the last 72 hours.  Invalid input(s): "FREET3" Anemia work up No results for input(s): "VITAMINB12", "FOLATE", "FERRITIN", "  TIBC", "IRON", "RETICCTPCT" in the last 72 hours. Urinalysis    Component Value Date/Time   APPEARANCEUR Clear 12/30/2022 1104   GLUCOSEU Negative 12/30/2022 1104   BILIRUBINUR Negative 12/30/2022 1104   PROTEINUR 2+ (A) 12/30/2022 1104   UROBILINOGEN 0.2 02/13/2020 1122   NITRITE Negative 12/30/2022 1104   LEUKOCYTESUR Negative 12/30/2022 1104   Sepsis Labs Recent Labs  Lab 01/03/24 1408 01/04/24 1504  WBC 6.7 6.2   Microbiology No results found for this or any previous visit (from the past 240 hours).   Total time spend on discharging this patient, including the last patient exam, discussing the hospital stay, instructions for ongoing care as it relates to all pertinent caregivers, as well as preparing the medical discharge records, prescriptions, and/or referrals as applicable, is 35 minutes.    Darlin Priestly, MD  [facility] 01/04/2024, 6:05 PM

## 2024-01-04 NOTE — Discharge Planning (Signed)
 ESTABLISHED HEMODIALYSIS [facility] [address] [place], [place] [address] [phone]  Schedule: MWF 5:45am  Confirmed patient is active, requested the clinic fax patient's Hepatitis B to dialysis room.  Dimas Chyle Dialysis Coordinator II  [facility] Cell: [phone] eFax: [phone] [email or web address]

## 2024-01-04 NOTE — Progress Notes (Signed)
 [facility]  ROUNDING NOTE   Subjective:   Patient well-known to [place] as we follow him for outpatient hemodialysis treatment in [facility] Now appears to have clotted access.   Objective:  Vital signs in last 24 hours:  Temp:  [97.7 F (36.5 C)-98.3 F (36.8 C)] 97.8 F (36.6 C) (02/26 0840) Pulse Rate:  [70-86] 77 (02/26 0700) Resp:  [16-20] 18 (02/26 0840) BP: (104-128)/(58-82) 122/79 (02/26 0840) SpO2:  [95 %-100 %] 98 % (02/26 0840) Weight:  [117.8 kg] 117.8 kg (02/25 1702)  Weight change:  Filed Weights   01/03/24 1702  Weight: 117.8 kg    Intake/Output: No intake/output data recorded.   Intake/Output this shift:  No intake/output data recorded.  Physical Exam: General: No acute distress  Head: Normocephalic, atraumatic. Moist oral mucosal membranes  Neck: Supple  Lungs:  Clear to auscultation, normal effort  Heart: S1S2 no rubs  Abdomen:  Soft, nontender, bowel sounds present  Extremities: 1+ peripheral edema.  Neurologic: Awake, alert, following commands  Skin: No acute rash  Access: Clotted left upper extremity AV graft    Basic Metabolic Panel: Recent Labs  Lab 01/03/24 1408 01/04/24 0552  NA 140 140  K 4.9 4.7  CL 101 103  CO2 24 23  GLUCOSE 78 74  BUN 82* 93*  CREATININE 15.59* 16.54*  CALCIUM 9.5 9.2    Liver Function Tests: No results for input(s): "AST", "ALT", "ALKPHOS", "BILITOT", "PROT", "ALBUMIN" in the last 168 hours. No results for input(s): "LIPASE", "AMYLASE" in the last 168 hours. No results for input(s): "AMMONIA" in the last 168 hours.  CBC: Recent Labs  Lab 01/03/24 1408  WBC 6.7  HGB Todd.5*  HCT 35.2*  MCV 100.9*  PLT 218    Cardiac Enzymes: No results for input(s): "CKTOTAL", "CKMB", "CKMBINDEX", "TROPONINI" in the last 168 hours.  BNP: Invalid input(s): "POCBNP"  CBG: No results for input(s): "GLUCAP" in the last 168 hours.  Microbiology: Results for orders placed or performed in  visit on 12/30/22  Microscopic Examination     Status: Abnormal   Collection Time: 12/30/22 Todd:04 AM   Urine  Result Value Ref Range Status   WBC, UA 6-10 (A) 0 - 5 /hpf Final   RBC, Urine 0-2 0 - 2 /hpf Final   Epithelial Cells (non renal) 0-10 0 - 10 /hpf Final   Casts Present (A) None seen /lpf Final   Cast Type Granular casts (A) N/A Final    Comment: Hyaline casts   Mucus, UA Present (A) Not Estab. Final   Bacteria, UA None seen None seen/Few Final    Coagulation Studies: No results for input(s): "LABPROT", "INR" in the last 72 hours.  Urinalysis: No results for input(s): "COLORURINE", "LABSPEC", "PHURINE", "GLUCOSEU", "HGBUR", "BILIRUBINUR", "KETONESUR", "PROTEINUR", "UROBILINOGEN", "NITRITE", "LEUKOCYTESUR" in the last 72 hours.  Invalid input(s): "APPERANCEUR"    Imaging: DG Chest 1 View Result Date: 01/03/2024 CLINICAL DATA:  End-stage renal disease EXAM: CHEST  1 VIEW COMPARISON:  Chest x-ray 06/03/2023 FINDINGS: Heart is mildly enlarged. Both lungs are clear. The visualized skeletal structures are unremarkable. IMPRESSION: Mild cardiomegaly. No active disease. Electronically Signed   By: Darliss Cheney M.D.   On: 01/03/2024 16:53     Medications:    sodium chloride 10 mL/hr at 01/04/24 0843    ceFAZolin (ANCEF) IV 1 g (01/04/24 0954)    [MAR Hold] allopurinol  300 mg Oral Daily   [MAR Hold] atorvastatin  40 mg Oral QHS   [  MAR Hold] calcium acetate  2,001 mg Oral TID WC   [MAR Hold] Chlorhexidine Gluconate Cloth  6 each Topical Q0600   [MAR Hold] fluticasone furoate-vilanterol  1 puff Inhalation Daily   [MAR Hold] heparin  5,000 Units Subcutaneous Q12H   [MAR Hold] loratadine  10 mg Oral Daily   [MAR Hold] methocarbamol  500 mg Oral Once per day on Sunday Tuesday Thursday   [facility] Hold] metoprolol tartrate  12.5 mg Oral BID   [MAR Hold] midodrine  10 mg Oral TID WC   [MAR Hold] tamsulosin  0.4 mg Oral Daily   [MAR Hold] acetaminophen, [MAR Hold] albuterol,  diphenhydrAMINE, famotidine, fentaNYL (SUBLIMAZE) injection, fentaNYL, [MAR Hold]  HYDROmorphone (DILAUDID) injection, methylPREDNISolone (SOLU-MEDROL) injection, midazolam, midazolam, ondansetron (ZOFRAN) IV, [MAR Hold] ondansetron **OR** [MAR Hold] ondansetron (ZOFRAN) IV, [MAR Hold] senna  Assessment/ Plan:  78 y.o. Todd Robertson with past medical history of ESRD on HD, nonischemic cardiomyopathy, hyperlipidemia, hypertension, gout, diabetes mellitus type 2, prostate cancer who presented with clotted AV graft in the left upper extremity.  1.  Complication of dialysis device.  Left upper extremity AV graft is clotted.  Hopefully this can be declotted or he may need PermCath.  Patient currently down in the vascular suite.  We will plan for dialysis treatment post procedure.  2.  Hypotension.  Maintain the patient on midodrine during dialysis treatments.  3.  ESRD.  Once access patency established we will proceed with renal placement therapy today.    LOS: 0 Shriley Joffe 2/26/202510:07 AM

## 2024-01-04 NOTE — Interval H&P Note (Signed)
 History and Physical Interval Note:  01/04/2024 9:31 AM  Todd Robertson  has presented today for surgery, with the diagnosis of Non Functioning AV Graft.  The various methods of treatment have been discussed with the patient and family. After consideration of risks, benefits and other options for treatment, the patient has consented to  Procedure(s): A/V Fistulagram (Left) DIALYSIS/PERMA CATHETER INSERTION (Right) as a surgical intervention.  The patient's history has been reviewed, patient examined, no change in status, stable for surgery.  I have reviewed the patient's chart and labs.  Questions were answered to the patient's satisfaction.     Festus Barren

## 2024-01-04 NOTE — Progress Notes (Signed)
  Received patient in bed to unit.   Informed consent signed and in chart.    TX duration: 3.5 hrs     Transported back to floor Hand-off given to patient's primary nurse.    Access used: L Graft Access issues: no   Total UF removed: 2 Medication(s) given: N/A Post HD VS: WNL Post HD weight: 113.3kg     Zola Button LPN [facility]ney Dialysis Unit

## 2024-01-04 NOTE — Op Note (Signed)
 [facility]    OPERATIVE NOTE   PROCEDURE: 1.  Left brachial artery to axillary vein arteriovenous graft cannulation under ultrasound guidance in both a retrograde and then antegrade fashion crossing 2.  Left arm shuntogram and central venogram 3.  Fogarty embolectomy for arterial plug 4.  Mechanical thrombectomy to the left brachial artery to axillary vein AV graft with the cleaner device  PRE-OPERATIVE DIAGNOSIS: 1. ESRD 2.  Thrombosed left brachial artery to axillary vein arteriovenous graft  POST-OPERATIVE DIAGNOSIS: same as above   SURGEON: Festus Barren, MD  ANESTHESIA: local with Moderate Conscious Sedation for approximately 20 minutes using 2.5 mg of Versed and 75 mcg of Fentanyl  ESTIMATED BLOOD LOSS: 15 cc  FINDING(S): Thrombosed AV graft able to be opened with cleaner thrombectomy device  SPECIMEN(S):  None  CONTRAST: 25 cc  FLUORO TIME: 1.2 minutes  INDICATIONS: Patient is a 78 y.o.male who presents with a thrombosed left brachial artery to axillary vein arteriovenous graft.  The patient is scheduled for an attempted declot and shuntogram.  The patient is aware the risks include but are not limited to: bleeding, infection, thrombosis of the cannulated access, and possible anaphylactic reaction to the contrast.  The patient is aware of the risks of the procedure and elects to proceed forward.  DESCRIPTION: After full informed written consent was obtained, the patient was brought back to the angiography suite and placed supine upon the angiography table.  The patient was connected to monitoring equipment. Moderate conscious sedation was administered with a face to face encounter with the patient throughout the procedure with my supervision of the RN administering medicines and monitoring the patient's vital signs, pulse oximetry, telemetry and mental status throughout from the start of the procedure until the patient was taken to the recovery room.  The left arm was prepped and draped in the standard fashion for a percutaneous access intervention.  Under ultrasound guidance, the left brachial artery to axillary vein arteriovenous graft was cannulated with a micropuncture needle under direct ultrasound guidance due to the pulseless nature of the graft in both an antegrade and a retrograde fashion crossing, and permanent images were performed.  The microwire was advanced and the needle was exchanged for the a microsheath.  I then upsized to a 6 Fr Sheath and imaging was performed.  Hand injections were completed to image the access including the central venous system. This demonstrated no flow within the AV graft.  Based on the images, this patient will need extensive treatment to salvage the graft. I then gave the patient 4000 units of intravenous heparin.  I then placed a Supracore wire into the brachial artery from the retrograde sheath. A 6 Fr sheath was used retrograde and a 7 Fr sheath was used antegrade The graft was thrombosed. An arterial plug was also seen at the arterial anastomosis. An attempt to clear the arterial plug was done with two passes of the Fogarty embolectomy balloon. This resulted in resolution of the arterial plug, and clearance of the arterial side of the graft. The arterial outflow was seen to be intact distally. The retrograde sheath was removed. I then turned my attention to the thrombus in the distal graft and the axillary vein. Mechanical thrombectomy was performed using the 7 Fr Cleaner device.Two passes were made. This resulted in resolution of the thrombus and restored patency of the AVG.  There was about a 30% stenosis at the venous anastomosis. The graft was patent. The central venous circulation was  patent.   Based on the completion imaging, no further intervention is necessary. A 4-0 Monocryl purse-string suture was sewn around the sheath.  The sheath was removed while tying down the suture.  A sterile bandage was  applied to the puncture site.  COMPLICATIONS: None  CONDITION: Stable   Festus Barren 01/04/2024 10:32 AM   This note was created with Dragon Medical transcription system. Any errors in dictation are purely unintentional.

## 2024-01-05 LAB — HEPATITIS B SURFACE ANTIBODY, QUANTITATIVE: Hep B S AB Quant (Post): <3.5 m[IU]/mL — ABNORMAL LOW

## 2024-01-06 DIAGNOSIS — N2581 Secondary hyperparathyroidism of renal origin: Secondary | ICD-10-CM | POA: Diagnosis not present

## 2024-01-06 DIAGNOSIS — N186 End stage renal disease: Secondary | ICD-10-CM | POA: Diagnosis not present

## 2024-01-06 DIAGNOSIS — D509 Iron deficiency anemia, unspecified: Secondary | ICD-10-CM | POA: Diagnosis not present

## 2024-01-06 DIAGNOSIS — N250 Renal osteodystrophy: Secondary | ICD-10-CM | POA: Diagnosis not present

## 2024-01-06 DIAGNOSIS — D631 Anemia in chronic kidney disease: Secondary | ICD-10-CM | POA: Diagnosis not present

## 2024-01-06 DIAGNOSIS — Z992 Dependence on renal dialysis: Secondary | ICD-10-CM | POA: Diagnosis not present

## 2024-01-07 DIAGNOSIS — D509 Iron deficiency anemia, unspecified: Secondary | ICD-10-CM | POA: Diagnosis not present

## 2024-01-07 DIAGNOSIS — N250 Renal osteodystrophy: Secondary | ICD-10-CM | POA: Diagnosis not present

## 2024-01-07 DIAGNOSIS — D631 Anemia in chronic kidney disease: Secondary | ICD-10-CM | POA: Diagnosis not present

## 2024-01-07 DIAGNOSIS — N2581 Secondary hyperparathyroidism of renal origin: Secondary | ICD-10-CM | POA: Diagnosis not present

## 2024-01-07 DIAGNOSIS — N186 End stage renal disease: Secondary | ICD-10-CM | POA: Diagnosis not present

## 2024-01-07 DIAGNOSIS — Z992 Dependence on renal dialysis: Secondary | ICD-10-CM | POA: Diagnosis not present

## 2024-01-09 NOTE — Progress Notes (Signed)
 Good morning.  I had originally put in order for FNA of his thyroid nodule.  [facility] [place] called (name is Luan Pulling I think) and said they needed me to re-enter ultrasound order (said the 06/2023 was too old to determine if biopsy is warranted) and they would do the ultrasound and FNA on same day.  I do not see where they did the biopsy.  Would you be able to help be follow up on this?  Do I need to re-enter the order for biopsy?

## 2024-01-09 NOTE — Progress Notes (Signed)
 Yes, please.  I put in the order in Feb for the FNA, I think he was supposed to get it at a different place but ended up going to [facility] and they said they could do it there but needed to repeat the ultrasound first?  Im not sure, its a big ol mess

## 2024-01-09 NOTE — Progress Notes (Signed)
 Thank you for investigating this!  What a mess!

## 2024-01-10 ENCOUNTER — Other Ambulatory Visit: Payer: Self-pay

## 2024-01-10 ENCOUNTER — Encounter (HOSPITAL_COMMUNITY)
Admission: RE | Disposition: A | Discharge status: Home / Self Care | Payer: Self-pay | Source: Home / Self Care | Attending: Nephrology

## 2024-01-10 ENCOUNTER — Encounter (HOSPITAL_COMMUNITY): Payer: Self-pay | Admitting: Nephrology

## 2024-01-10 ENCOUNTER — Ambulatory Visit (HOSPITAL_COMMUNITY)
Admission: RE | Admit: 2024-01-10 | Discharge: 2024-01-10 | Disposition: A | Discharge status: Home / Self Care | Attending: Nephrology | Admitting: Nephrology

## 2024-01-10 DIAGNOSIS — I132 Hypertensive heart and chronic kidney disease with heart failure and with stage 5 chronic kidney disease, or end stage renal disease: Secondary | ICD-10-CM | POA: Diagnosis not present

## 2024-01-10 DIAGNOSIS — D509 Iron deficiency anemia, unspecified: Secondary | ICD-10-CM | POA: Diagnosis not present

## 2024-01-10 DIAGNOSIS — N186 End stage renal disease: Secondary | ICD-10-CM | POA: Insufficient documentation

## 2024-01-10 DIAGNOSIS — Z992 Dependence on renal dialysis: Secondary | ICD-10-CM | POA: Diagnosis not present

## 2024-01-10 DIAGNOSIS — Z841 Family history of disorders of kidney and ureter: Secondary | ICD-10-CM | POA: Diagnosis not present

## 2024-01-10 DIAGNOSIS — N250 Renal osteodystrophy: Secondary | ICD-10-CM | POA: Diagnosis not present

## 2024-01-10 DIAGNOSIS — I82622 Acute embolism and thrombosis of deep veins of left upper extremity: Secondary | ICD-10-CM | POA: Diagnosis not present

## 2024-01-10 DIAGNOSIS — D631 Anemia in chronic kidney disease: Secondary | ICD-10-CM | POA: Insufficient documentation

## 2024-01-10 DIAGNOSIS — T82868A Thrombosis of vascular prosthetic devices, implants and grafts, initial encounter: Secondary | ICD-10-CM | POA: Insufficient documentation

## 2024-01-10 DIAGNOSIS — Y832 Surgical operation with anastomosis, bypass or graft as the cause of abnormal reaction of the patient, or of later complication, without mention of misadventure at the time of the procedure: Secondary | ICD-10-CM | POA: Diagnosis not present

## 2024-01-10 DIAGNOSIS — Z8249 Family history of ischemic heart disease and other diseases of the circulatory system: Secondary | ICD-10-CM | POA: Diagnosis not present

## 2024-01-10 DIAGNOSIS — Z87891 Personal history of nicotine dependence: Secondary | ICD-10-CM | POA: Diagnosis not present

## 2024-01-10 DIAGNOSIS — E1122 Type 2 diabetes mellitus with diabetic chronic kidney disease: Secondary | ICD-10-CM | POA: Diagnosis not present

## 2024-01-10 DIAGNOSIS — T82858A Stenosis of vascular prosthetic devices, implants and grafts, initial encounter: Secondary | ICD-10-CM | POA: Diagnosis not present

## 2024-01-10 DIAGNOSIS — I5022 Chronic systolic (congestive) heart failure: Secondary | ICD-10-CM | POA: Insufficient documentation

## 2024-01-10 DIAGNOSIS — N2581 Secondary hyperparathyroidism of renal origin: Secondary | ICD-10-CM | POA: Diagnosis not present

## 2024-01-10 HISTORY — PX: PERIPHERAL VASCULAR BALLOON ANGIOPLASTY: CATH118281

## 2024-01-10 HISTORY — PX: PERIPHERAL VASCULAR THROMBECTOMY: CATH118306

## 2024-01-10 SURGERY — PERIPHERAL VASCULAR THROMBECTOMY
Anesthesia: LOCAL

## 2024-01-10 MED ORDER — HEPARIN (PORCINE) IN NACL 1000-0.9 UT/500ML-% IV SOLN
INTRAVENOUS | Status: DC | PRN
  Administered 2024-01-10: 500 mL

## 2024-01-10 MED ORDER — FENTANYL CITRATE (PF) 100 MCG/2ML IJ SOLN
INTRAMUSCULAR | Status: AC
  Filled 2024-01-10: qty 2

## 2024-01-10 MED ORDER — MIDAZOLAM HCL 2 MG/2ML IJ SOLN
INTRAMUSCULAR | Status: DC | PRN
  Administered 2024-01-10: 1 mg via INTRAVENOUS

## 2024-01-10 MED ORDER — MIDAZOLAM HCL 2 MG/2ML IJ SOLN
INTRAMUSCULAR | Status: AC
  Filled 2024-01-10: qty 2

## 2024-01-10 MED ORDER — LIDOCAINE HCL (PF) 1 % IJ SOLN
INTRAMUSCULAR | Status: DC | PRN
  Administered 2024-01-10: 2 mL via INTRADERMAL

## 2024-01-10 MED ORDER — HEPARIN SODIUM (PORCINE) 1000 UNIT/ML IJ SOLN
INTRAMUSCULAR | Status: DC | PRN
  Administered 2024-01-10: 5000 [IU] via INTRAVENOUS

## 2024-01-10 MED ORDER — IODIXANOL 320 MG/ML IV SOLN
INTRAVENOUS | Status: DC | PRN
  Administered 2024-01-10: 12 mL via INTRAVENOUS

## 2024-01-10 MED ORDER — LIDOCAINE HCL (PF) 1 % IJ SOLN
INTRAMUSCULAR | Status: AC
  Filled 2024-01-10: qty 30

## 2024-01-10 MED ORDER — FENTANYL CITRATE (PF) 100 MCG/2ML IJ SOLN
INTRAMUSCULAR | Status: DC | PRN
  Administered 2024-01-10: 50 ug via INTRAVENOUS

## 2024-01-10 SURGICAL SUPPLY — 15 items
BAG SNAP BAND KOVER 36X36 (MISCELLANEOUS) IMPLANT
BALLN MUSTANG 6.0X40 75 (BALLOONS) ×2 IMPLANT
BALLN MUSTANG 8X80X75 (BALLOONS) ×2 IMPLANT
BALLOON FOGARTY 5FR 40 (CATHETERS) IMPLANT
BALLOON MUSTANG 6.0X40 75 (BALLOONS) IMPLANT
BALLOON MUSTANG 8X80X75 (BALLOONS) IMPLANT
CATH FOGARTY BALL 5FR 40 (CATHETERS) ×2 IMPLANT
CATH MACH 1 ST 7FR 55 (CATHETERS) IMPLANT
COVER DOME SNAP 22 D (MISCELLANEOUS) IMPLANT
GUIDEWIRE ANGLED .035 180CM (WIRE) IMPLANT
SHEATH PINNACLE R/O II 5F 6CM (SHEATH) IMPLANT
SHEATH PINNACLE R/O II 7F 4CM (SHEATH) IMPLANT
SYR MEDALLION 10ML (SYRINGE) IMPLANT
TRAY PV CATH (CUSTOM PROCEDURE TRAY) IMPLANT
WIRE MICRO SET SILHO 5FR 7 (SHEATH) IMPLANT

## 2024-01-10 NOTE — Op Note (Signed)
 Patient referred for a thrombectomy of his left upper arm AVG; post declot on August 22, 2023 by Dr. Wyn Quaker also declotted on January 04, 2024 with mention by Dr. Wyn Quaker that it would require extensive treatment to salvage the graft..   On examination there is no pulse or bruit in the left upper arm arc graft. The left radial pulse is 1+.   Summary:  1) Successful thrombectomy of a left upper arm arc AVG with evidence of 50% venous anastomotic and 50% outflow intragraft stenosis which was corrected with angioplasty (8 Mustang FE ~12-15 ATM). 2) Arterial anastomosis treated with a 6 mm balloon but not convinced it was stenotic.  More likely to be remnant thrombus plug which was then adequately treated with repeat embolectomy passes from the proximal and distal brachial arteries.  Very rapid flows once the remnant plug was removed. 3) Patent HUGE left axillary vein and sided central veins. 4) This left upper arm arc graft remains amenable to future percutaneous intervention.  Description of procedure: The left arm was prepped and draped in the usual fashion. The left upper arm arc AVG was first cannulated (29562) in the arterial limb in an antegrade direction and then a 7Fr sheath was inserted by guidewire exchange technique. A 0.035 guidewire was passed through the clotted graft into the central veins under fluoroscopic guidance and then a 5Fr diagnostic catheter inserted over the wire. The wire was removed, and then intravenous heparin and sedative drugs administered. A pullback angiogram was performed by injecting contrast ([id number]) via the diagnostic catheter. This showed patent central veins, patent left axillary vein, 50% venous anastomosis stenosis, 50% outflow intragraft stenosis and evidence of filling defects in the graft consistent with thrombus.  Three passes were made with the 7Fr [facility] 1 catheter from the axillary vein to the antegrade sheath to physically remove thrombus and perform the thrombectomy  (57846). The catheter was removed, and an 8 x 8 Mustang angioplasty balloon was inserted over the wire to the level of the mid-intragraft lesion followed by venous angioplasty (96295) carried out to 12-15 ATM at only the site of stenoses to FULL effacement and then more gently in the rest of the graft circuit to macerate the thrombus.    In order to remove this clot and remove the platelet plug at the arterial anastomosis, a second cannulation (28413) was required in a retrograde direction. A 7Fr sheath was inserted by guidewire exchange technique. A 4Fr Fogarty catheter was inserted through the sheath and advanced into the proximal brachial artery. The over the wire embolectomy balloon was inflated with 0.6cc of contrast and then pulled across the arterial anastomosis into the graft with aspiration of the sheath via the side port to remove the platelet plug and thrombus. [phone]). This step was repeated to sweep all residual clot from this side of the graft. In order to check the arterial inflow an arteriogram was necessary because refluxing contrast from the graft would have caused risk of embolizing thrombus from the graft into the arterial vasculature. A glidewire and straight catheter were inserted into the proximal brachial artery and an arteriogram performed by parking the tip of the catheter 2cm proximal to the arterial anastomosis. The arteriogram documented a good calibered proximal and distal brachial artery with slow flow into the graft and a defect at the anastomotic level likely representing remnant thrombus.    I then treated the inflow anastomosis with a 6 x 4 Mustang balloon easily effaced approximately 8 atm of pressure, then  repeat embolectomy balloon passes from the proximal followed by distal brachial artery sites.  I then polished the entire outflow tract/graft circuit with the partially inflated 8mm angioplasty balloon to alleviate thrombus burden.  Completion outflow angiogram revealed  rapid flows, no further retained thrombus burden, 10% residual stenosis at all treated levels  with no dissection or extravasation. The graft had a good thrill and bruit.  Hemostasis: A 3-0 ethilon purse string suture was placed at the cannulation site on removal of the sheath.  Sedation: 1 mg Versed, 50 mcg Fentanyl. Sedation time. 24  minutes  Contrast. 12  mL  Monitoring: Because of the patient's comorbid conditions and sedation during the procedure, continuous EKG monitoring and O2 saturation monitoring was performed throughout the procedure by the RN. There were no abnormal arrhythmias encountered.  Complications: None.   Diagnoses: N18.6 End stage renal disease  T82.858A Stenosis of vascular prosthetic devices, implants and grafts, initial encounter T82.868A Thrombosis of vascular prosthetic device/graft, initial  Procedure Coding:  [phone] Cannulation and angiogram of fistula/ graft, thrombectomy and venous angioplasty U0454 Contrast  Recommendations:  1. Continue to cannulate the graft with 15G needles.  2. Refer back for problems with flows. 3. Remove the sutures next treatment.   Discharge: The patient was discharged home in stable condition. The patient was given education regarding the care of the dialysis access AVF and specific instructions in case of any problems.

## 2024-01-10 NOTE — Discharge Instructions (Signed)

## 2024-01-10 NOTE — H&P (Addendum)
 Chief Complaint: Decreased flows  Interval H&P  The patient has presented today for an angiogram/ angioplasty.  Various methods of treatment have been discussed with the patient.  After consideration of risk, benefits and other options for treatment, the patient has consented to a angiogram/ angioplasty with  possible stent placement.   Risks of angiogram with potential angioplasty and stenting if needed.contrast reaction, extravasation/ bleeding, dissection, hypotension and death were explained to the patient.  The patient's history has been reviewed and the patient has been examined, no changes in status.  Stable for angiogram/angioplasty  I have reviewed the patient's chart and labs.  Questions were answered to the patient's satisfaction.  Assessment/Plan: ESRD dialyzing  MWF [facility] with last dialysis Friday Thrombosed AVG post declott on August 22, 2023 by Dr. Wyn Quaker also declotted on January 04, 2024 with mention by Dr. Wyn Quaker that it would require extensive treatment to salvage the graft..- planning on declot with angioplasty. Renal osteodystrophy - continue binders per home regimen. Anemia - managed with ESA's and IV iron at dialysis center. HTN - resume home regimen.   HPI: Todd Robertson is an 78 y.o. male COPD, hypertension, end-stage renal disease dialyzing Monday Wednesday Fridays and [place] with last treatment on Friday.  Also has a history of diabetes, heart failure with reduced ejection fraction and prostate cancer.  Recent thrombectomy August 22, 2023 as well as January 04, 2024 by Dr. Wyn Quaker.  Denies any hypotensive episodes.  ROS Per HPI.  Chemistry and CBC: Creatinine, Ser  Date/Time Value Ref Range Status  01/04/2024 05:52 AM 16.54 (H) 0.61 - 1.24 mg/dL Final  16/08/9603 54:09 PM 15.59 (H) 0.61 - 1.24 mg/dL Final  81/19/1478 29:56 PM 13.23 (H) 0.61 - 1.24 mg/dL Final  21/30/8657 84:69 AM 10.90 (H) 0.61 - 1.24 mg/dL Final  62/95/2841 32:44 AM 9.30 (H)  0.61 - 1.24 mg/dL Final  11/10/7251 66:44 PM 11.00 (H) 0.61 - 1.24 mg/dL Final  03/47/4259 56:38 PM 9.19 (H) 0.61 - 1.24 mg/dL Final  75/64/3329 51:88 PM 10.01 (H) 0.61 - 1.24 mg/dL Final  41/66/0630 16:01 PM 11.22 (H) 0.61 - 1.24 mg/dL Final  09/32/3557 32:20 PM 7.34 (H) 0.61 - 1.24 mg/dL Final  25/42/7062 37:62 PM 10.73 (H) 0.61 - 1.24 mg/dL Final  83/15/1761 60:73 AM 11.56 (H) 0.61 - 1.24 mg/dL Final  71/04/2693 85:46 AM 9.68 (H) 0.61 - 1.24 mg/dL Final  27/01/5008 38:18 AM 7.13 (H) 0.61 - 1.24 mg/dL Final    Comment:    DELTA CHECK NOTED  06/07/2020 02:00 AM 10.91 (H) 0.61 - 1.24 mg/dL Final  29/93/7169 67:89 AM 8.92 (H) 0.61 - 1.24 mg/dL Final  38/08/1750 02:58 AM 7.25 (H) 0.61 - 1.24 mg/dL Final  52/77/8242 35:36 AM 9.32 (H) 0.61 - 1.24 mg/dL Final  14/43/1540 08:67 AM 6.46 (H) 0.61 - 1.24 mg/dL Final  61/95/0932 67:12 PM 5.81 (H) 0.61 - 1.24 mg/dL Final    Comment:    DELTA CHECK NOTED OK PER RN    06/02/2020 01:53 AM 3.26 (H) 0.61 - 1.24 mg/dL Final  45/80/9983 38:25 PM 3.40 (H) 0.61 - 1.24 mg/dL Final  05/39/7673 41:93 AM 3.18 (H) 0.61 - 1.24 mg/dL Final  79/12/4095 35:32 PM 3.22 (H) 0.61 - 1.24 mg/dL Final  99/24/2683 41:96 AM 3.33 (H) 0.61 - 1.24 mg/dL Final  22/29/7989 21:19 PM 3.42 (H) 0.61 - 1.24 mg/dL Final  41/74/0814 48:18 AM 3.48 (H) 0.61 - 1.24 mg/dL Final  56/31/4970 26:37 PM 3.67 (H) 0.61 - 1.24 mg/dL  Final  05/29/2020 03:39 AM 4.12 (H) 0.61 - 1.24 mg/dL Final  16/08/9603 54:09 PM 5.14 (H) 0.61 - 1.24 mg/dL Final  81/19/1478 29:56 AM 6.45 (H) 0.61 - 1.24 mg/dL Final  21/30/8657 84:69 PM 8.44 (H) 0.61 - 1.24 mg/dL Final  62/95/2841 32:44 AM 7.63 (H) 0.61 - 1.24 mg/dL Final  11/10/7251 66:44 AM 5.12 (H) 0.61 - 1.24 mg/dL Final    Comment:    DELTA CHECK NOTED  05/25/2020 03:30 AM 2.88 (H) 0.61 - 1.24 mg/dL Final  03/47/4259 56:38 PM 3.16 (H) 0.61 - 1.24 mg/dL Final  75/64/3329 51:88 AM 3.36 (H) 0.61 - 1.24 mg/dL Final  41/66/0630 16:01 PM 3.55 (H) 0.61 -  1.24 mg/dL Final  09/32/3557 32:20 AM 4.37 (H) 0.61 - 1.24 mg/dL Final  25/42/7062 37:62 PM 5.50 (H) 0.61 - 1.24 mg/dL Final  83/15/1761 60:73 PM 5.23 (H) 0.61 - 1.24 mg/dL Final    Comment:    DELTA CHECK NOTED  05/22/2020 04:30 PM 3.41 (H) 0.61 - 1.24 mg/dL Final    Comment:    DELTA CHECK NOTED  05/22/2020 02:58 AM 6.97 (H) 0.61 - 1.24 mg/dL Final  71/04/2693 85:46 AM 6.70 (H) 0.61 - 1.24 mg/dL Final  27/01/5008 38:18 PM 8.82 (H) 0.61 - 1.24 mg/dL Final  29/93/7169 67:89 AM 9.46 (H) 0.61 - 1.24 mg/dL Final  38/08/1750 02:58 AM 9.41 (H) 0.61 - 1.24 mg/dL Final  52/77/8242 35:36 AM 8.84 (H) 0.61 - 1.24 mg/dL Final  14/43/1540 08:67 AM 7.44 (H) 0.61 - 1.24 mg/dL Final  61/95/0932 67:12 PM 6.66 (H) 0.61 - 1.24 mg/dL Final  45/80/9983 38:25 AM 5.11 (H) 0.61 - 1.24 mg/dL Final  05/39/7673 41:93 AM 3.89 (H) 0.61 - 1.24 mg/dL Final   Recent Labs  Lab 01/03/24 1408 01/04/24 0552  NA 140 140  K 4.9 4.7  CL 101 103  CO2 24 23  GLUCOSE 78 74  BUN 82* 93*  CREATININE 15.59* 16.54*  CALCIUM 9.5 9.2   Recent Labs  Lab 01/03/24 1408 01/04/24 1504  WBC 6.7 6.2  HGB 11.5* 10.6*  HCT 35.2* 32.3*  MCV 100.9* 99.1  PLT 218 211   Liver Function Tests: No results for input(s): "AST", "ALT", "ALKPHOS", "BILITOT", "PROT", "ALBUMIN" in the last 168 hours. No results for input(s): "LIPASE", "AMYLASE" in the last 168 hours. No results for input(s): "AMMONIA" in the last 168 hours. Cardiac Enzymes: No results for input(s): "CKTOTAL", "CKMB", "CKMBINDEX", "TROPONINI" in the last 168 hours. Iron Studies: No results for input(s): "IRON", "TIBC", "TRANSFERRIN", "FERRITIN" in the last 72 hours. PT/INR: @LABRCNTIP (inr:5)  Xrays/Other Studies: )No results found for this or any previous visit (from the past 48 hours). No results found.  PMH:   Past Medical History:  Diagnosis Date   Arthritis    Cancer (HCC)    prostate   Cardiomyopathy    secondary   CKD (chronic kidney disease)     DM2 (diabetes mellitus, type 2) (HCC)    Gout    HTN (hypertension)    unspec   Hypercholesterolemia    Nonischemic cardiomyopathy (HCC)    a. EF 40-50% by most recent 2-D echo b. EF 25% by cardiac cath in 2004   Overweight(278.02)     PSH:   Past Surgical History:  Procedure Laterality Date   A/V FISTULAGRAM Left 01/04/2024   Procedure: A/V Fistulagram;  Surgeon: Annice Needy, MD;  Location: [facility];  Service: Cardiovascular;  Laterality: Left;   AV FISTULA PLACEMENT Left 06/06/2020  Procedure: LEFT ARM ARTERIOVENOUS (AV) GRAFT USING 45CM GORTEX;  Surgeon: Larina Earthly, MD;  Location: [facility];  Service: Vascular;  Laterality: Left;   AV FISTULA PLACEMENT Left 06/08/2022   Procedure: INSERTION OF ARTERIOVENOUS GORE-TEX GRAFT UPPER ARM;  Surgeon: Larina Earthly, MD;  Location: [facility];  Service: Vascular;  Laterality: Left;   AVGG REMOVAL Left 07/27/2022   Procedure: EXCISION OF INFECTED PORTION OF LEFT FOREARM ARTERIOVENOUS GORETEX GRAFT (AVGG);  Surgeon: Larina Earthly, MD;  Location: [facility];  Service: Vascular;  Laterality: Left;   COLONOSCOPY     DIALYSIS/PERMA CATHETER INSERTION Right 01/04/2024   Procedure: DIALYSIS/PERMA CATHETER INSERTION;  Surgeon: Annice Needy, MD;  Location: [facility];  Service: Cardiovascular;  Laterality: Right;   HERNIA REPAIR     INSERTION OF DIALYSIS CATHETER Right 06/06/2020   Procedure: INSERTION OF DIALYSIS CATHETER USING 23CM DOUBLE LUMEN CATHETER;  Surgeon: Larina Earthly, MD;  Location: [facility];  Service: Vascular;  Laterality: Right;   IR ANGIOGRAM EXTREMITY LEFT  10/25/2022   IR FLUORO GUIDE CV LINE RIGHT  05/20/2020   IR THROMBECTOMY AV FISTULA W/THROMBOLYSIS/PTA INC/SHUNT/IMG LEFT Left 10/25/2022   IR THROMBECTOMY AV FISTULA W/THROMBOLYSIS/PTA INC/SHUNT/IMG LEFT Left 08/05/2023   IR US GUIDE VASC ACCESS LEFT  10/25/2022   IR US GUIDE VASC ACCESS LEFT  08/09/2023   IR US GUIDE VASC ACCESS RIGHT  05/20/2020   KNEE ARTHROPLASTY Left  08/08/2017   Procedure: LEFT TOTAL KNEE ARTHROPLASTY WITH COMPUTER NAVIGATION;  Surgeon: Samson Frederic, MD;  Location: [facility];  Service: Orthopedics;  Laterality: Left;  Needs RNFA   KNEE ARTHROPLASTY Right 11/17/2017   Procedure: RIGHT TOTAL KNEE ARTHROPLASTY WITH COMPUTER NAVIGATION;  Surgeon: Samson Frederic, MD;  Location: [facility];  Service: Orthopedics;  Laterality: Right;  NEEDS RNFA   PERIPHERAL VASCULAR THROMBECTOMY Left 08/22/2023   Procedure: PERIPHERAL VASCULAR THROMBECTOMY;  Surgeon: Annice Needy, MD;  Location: [facility];  Service: Cardiovascular;  Laterality: Left;   REMOVAL OF A DIALYSIS CATHETER Right 09/07/2022   Procedure: MINOR REMOVAL OF A TUNNELED DIALYSIS CATHETER;  Surgeon: Larina Earthly, MD;  Location: [facility];  Service: Vascular;  Laterality: Right;   ROBOT ASSISTED LAPAROSCOPIC NEPHRECTOMY Right 05/16/2020   Procedure: XI ROBOTIC ASSISTED LAPAROSCOPIC NEPHRECTOMY;  Surgeon: Malen Gauze, MD;  Location: [facility];  Service: Urology;  Laterality: Right;  2.5 hrs    Allergies: No Known Allergies  Medications:   Prior to Admission medications   Medication Sig Start Date End Date Taking? Authorizing Provider  acetaminophen (TYLENOL) 500 MG tablet Take 500-1,000 mg by mouth every 6 (six) hours as needed for moderate pain.    [provider]  albuterol (VENTOLIN HFA) 108 (90 Base) MCG/ACT inhaler Inhale 1-2 puffs into the lungs every 6 (six) hours as needed for wheezing or shortness of breath. Patient taking differently: Inhale 1-2 puffs into the lungs 2 (two) times daily. 06/24/20   Angiulli, Mcarthur Rossetti, PA-C  allopurinol (ZYLOPRIM) 300 MG tablet Take 300 mg by mouth daily. 10/21/20   [provider]  atorvastatin (LIPITOR) 40 MG tablet Take 1 tablet (40 mg total) by mouth daily. Patient taking differently: Take 40 mg by mouth every Monday, Wednesday, and Friday. 06/24/20   Angiulli, Mcarthur Rossetti, PA-C  b complex-vitamin c-folic acid (NEPHRO-VITE) 0.8 MG  TABS tablet Take 1 tablet by mouth at bedtime.    [provider]  BREO ELLIPTA 100-25 MCG/ACT AEPB Inhale 1 puff into the lungs  daily. 05/18/22   [provider]  calcium acetate (PHOSLO) 667 MG capsule Take 3 capsules (2,001 mg total) by mouth 3 (three) times daily with meals. 06/24/20   Angiulli, Mcarthur Rossetti, PA-C  cetirizine (ZYRTEC) 10 MG tablet Take 10 mg by mouth daily.    [provider]  Cholecalciferol (VITAMIN D) 50 MCG (2000 UT) tablet Take 1 tablet (2,000 Units total) by mouth daily. 06/24/20   Angiulli, Mcarthur Rossetti, PA-C  Colchicine 0.6 MG CAPS Take 1 capsule (0.6 mg total) by mouth 2 (two) times daily as needed. Home med. 01/04/24   Darlin Priestly, MD  methocarbamol (ROBAXIN) 500 MG tablet Take 500 mg by mouth 3 (three) times a week. Take Sun, Tue, and Thurs (day before dialysis) 05/11/22   [provider]  midodrine (PROAMATINE) 10 MG tablet TAKE 1 TABLET BY MOUTH THREE TIMES DAILY WITH MEALS 10/18/23   Antoine Poche, MD  Multiple Vitamins-Minerals (CENTRUM SILVER 50+MEN) TABS Take 1 tablet by mouth daily.    [provider]  Naphazoline HCl (CLEAR EYES OP) Place 1 drop into both eyes daily.    [provider]  Polyethylene Glycol 3350 (MIRALAX PO) Take 17 g by mouth daily as needed (constipation).    [provider]  pramipexole (MIRAPEX) 0.5 MG tablet Take 0.5 mg by mouth at bedtime. 12/23/23   [provider]  senna (SENOKOT) 8.6 MG tablet Take 1-2 tablets by mouth daily as needed for constipation.    [provider]  tamsulosin (FLOMAX) 0.4 MG CAPS capsule Take 1 capsule (0.4 mg total) by mouth daily. 12/29/23   Bjorn Pippin, MD  trolamine salicylate (ASPERCREME) 10 % cream Apply 1 Application topically as needed for muscle pain.    [provider]    Discontinued Meds:  There are no discontinued medications.  Social History:  reports that he quit smoking about 39 years ago. His smoking use included  cigarettes. He started smoking about 57 years ago. He has a 10 pack-year smoking history. He has never used smokeless tobacco. He reports that he does not drink alcohol and does not use drugs.  Family History:   Family History  Problem Relation Age of Onset   Hypertension Mother    Kidney disease Mother    Heart disease Father    Kidney disease Other     Blood pressure 134/83, pulse 74, resp. rate 14, height 6' (1.829 m), weight 115.7 kg, SpO2 98%. GEN: NAD, A&Ox3, NCAT HEENT: No conjunctival pallor, EOMI NECK: Supple, no thyromegaly LUNGS: CTA B/L no rales, rhonchi or wheezing CV: RRR, No M/R/G ABD: SNDNT +BS  EXT: No lower extremity edema ACCESS: Thrombosed left upper arm AV graft       Geneieve Duell, Len Blalock, MD 01/10/2024, 7:15 AM

## 2024-01-11 DIAGNOSIS — Z992 Dependence on renal dialysis: Secondary | ICD-10-CM | POA: Diagnosis not present

## 2024-01-11 DIAGNOSIS — D509 Iron deficiency anemia, unspecified: Secondary | ICD-10-CM | POA: Diagnosis not present

## 2024-01-11 DIAGNOSIS — N2581 Secondary hyperparathyroidism of renal origin: Secondary | ICD-10-CM | POA: Diagnosis not present

## 2024-01-11 DIAGNOSIS — N186 End stage renal disease: Secondary | ICD-10-CM | POA: Diagnosis not present

## 2024-01-11 DIAGNOSIS — D631 Anemia in chronic kidney disease: Secondary | ICD-10-CM | POA: Diagnosis not present

## 2024-01-12 DIAGNOSIS — R059 Cough, unspecified: Secondary | ICD-10-CM | POA: Diagnosis not present

## 2024-01-12 DIAGNOSIS — E1169 Type 2 diabetes mellitus with other specified complication: Secondary | ICD-10-CM | POA: Diagnosis not present

## 2024-01-12 DIAGNOSIS — I10 Essential (primary) hypertension: Secondary | ICD-10-CM | POA: Diagnosis not present

## 2024-01-12 DIAGNOSIS — N186 End stage renal disease: Secondary | ICD-10-CM | POA: Diagnosis not present

## 2024-01-12 DIAGNOSIS — Z299 Encounter for prophylactic measures, unspecified: Secondary | ICD-10-CM | POA: Diagnosis not present

## 2024-01-13 DIAGNOSIS — N2581 Secondary hyperparathyroidism of renal origin: Secondary | ICD-10-CM | POA: Diagnosis not present

## 2024-01-13 DIAGNOSIS — Z992 Dependence on renal dialysis: Secondary | ICD-10-CM | POA: Diagnosis not present

## 2024-01-13 DIAGNOSIS — N186 End stage renal disease: Secondary | ICD-10-CM | POA: Diagnosis not present

## 2024-01-13 DIAGNOSIS — D631 Anemia in chronic kidney disease: Secondary | ICD-10-CM | POA: Diagnosis not present

## 2024-01-13 DIAGNOSIS — D509 Iron deficiency anemia, unspecified: Secondary | ICD-10-CM | POA: Diagnosis not present

## 2024-01-16 DIAGNOSIS — D631 Anemia in chronic kidney disease: Secondary | ICD-10-CM | POA: Diagnosis not present

## 2024-01-16 DIAGNOSIS — D509 Iron deficiency anemia, unspecified: Secondary | ICD-10-CM | POA: Diagnosis not present

## 2024-01-16 DIAGNOSIS — N186 End stage renal disease: Secondary | ICD-10-CM | POA: Diagnosis not present

## 2024-01-16 DIAGNOSIS — Z992 Dependence on renal dialysis: Secondary | ICD-10-CM | POA: Diagnosis not present

## 2024-01-16 DIAGNOSIS — N2581 Secondary hyperparathyroidism of renal origin: Secondary | ICD-10-CM | POA: Diagnosis not present

## 2024-01-18 DIAGNOSIS — N186 End stage renal disease: Secondary | ICD-10-CM | POA: Diagnosis not present

## 2024-01-18 DIAGNOSIS — D631 Anemia in chronic kidney disease: Secondary | ICD-10-CM | POA: Diagnosis not present

## 2024-01-18 DIAGNOSIS — D509 Iron deficiency anemia, unspecified: Secondary | ICD-10-CM | POA: Diagnosis not present

## 2024-01-18 DIAGNOSIS — Z992 Dependence on renal dialysis: Secondary | ICD-10-CM | POA: Diagnosis not present

## 2024-01-18 DIAGNOSIS — N2581 Secondary hyperparathyroidism of renal origin: Secondary | ICD-10-CM | POA: Diagnosis not present

## 2024-01-20 ENCOUNTER — Ambulatory Visit (HOSPITAL_COMMUNITY)
Admission: RE | Admit: 2024-01-20 | Discharge: 2024-01-20 | Disposition: A | Discharge status: Home / Self Care | Source: Ambulatory Visit | Attending: Nurse Practitioner | Admitting: Nurse Practitioner

## 2024-01-20 ENCOUNTER — Encounter (HOSPITAL_COMMUNITY): Payer: Self-pay

## 2024-01-20 DIAGNOSIS — Z992 Dependence on renal dialysis: Secondary | ICD-10-CM | POA: Diagnosis not present

## 2024-01-20 DIAGNOSIS — N2581 Secondary hyperparathyroidism of renal origin: Secondary | ICD-10-CM | POA: Diagnosis not present

## 2024-01-20 DIAGNOSIS — D631 Anemia in chronic kidney disease: Secondary | ICD-10-CM | POA: Diagnosis not present

## 2024-01-20 DIAGNOSIS — E041 Nontoxic single thyroid nodule: Secondary | ICD-10-CM | POA: Diagnosis not present

## 2024-01-20 DIAGNOSIS — E042 Nontoxic multinodular goiter: Secondary | ICD-10-CM | POA: Diagnosis not present

## 2024-01-20 DIAGNOSIS — D509 Iron deficiency anemia, unspecified: Secondary | ICD-10-CM | POA: Diagnosis not present

## 2024-01-20 DIAGNOSIS — N186 End stage renal disease: Secondary | ICD-10-CM | POA: Diagnosis not present

## 2024-01-20 MED ORDER — LIDOCAINE HCL (PF) 2 % IJ SOLN
INTRAMUSCULAR | Status: AC
  Filled 2024-01-20: qty 20

## 2024-01-20 MED ORDER — LIDOCAINE HCL (PF) 2 % IJ SOLN
10.0000 mL | Freq: Once | INTRAMUSCULAR | Status: AC
Start: 2024-01-20 — End: 2024-01-20
  Administered 2024-01-20: 10 mL

## 2024-01-20 NOTE — Progress Notes (Signed)
 PT tolerated double thyroid biopsy procedure well today. Labs and afirma obtained and sent for pathology by Richard from ultrasound. PT left via wheelchair at discharge with no acute distress noted and verbalized understanding of discharge instructions.

## 2024-01-23 DIAGNOSIS — D509 Iron deficiency anemia, unspecified: Secondary | ICD-10-CM | POA: Diagnosis not present

## 2024-01-23 DIAGNOSIS — N2581 Secondary hyperparathyroidism of renal origin: Secondary | ICD-10-CM | POA: Diagnosis not present

## 2024-01-23 DIAGNOSIS — N186 End stage renal disease: Secondary | ICD-10-CM | POA: Diagnosis not present

## 2024-01-23 DIAGNOSIS — Z992 Dependence on renal dialysis: Secondary | ICD-10-CM | POA: Diagnosis not present

## 2024-01-23 DIAGNOSIS — D631 Anemia in chronic kidney disease: Secondary | ICD-10-CM | POA: Diagnosis not present

## 2024-01-24 DIAGNOSIS — E1140 Type 2 diabetes mellitus with diabetic neuropathy, unspecified: Secondary | ICD-10-CM | POA: Diagnosis not present

## 2024-01-24 DIAGNOSIS — E1151 Type 2 diabetes mellitus with diabetic peripheral angiopathy without gangrene: Secondary | ICD-10-CM | POA: Diagnosis not present

## 2024-01-24 DIAGNOSIS — B351 Tinea unguium: Secondary | ICD-10-CM | POA: Diagnosis not present

## 2024-01-24 DIAGNOSIS — E042 Nontoxic multinodular goiter: Secondary | ICD-10-CM | POA: Diagnosis not present

## 2024-01-24 DIAGNOSIS — L110 Acquired keratosis follicularis: Secondary | ICD-10-CM | POA: Diagnosis not present

## 2024-01-24 LAB — CYTOLOGY - NON PAP

## 2024-01-25 ENCOUNTER — Encounter: Payer: Self-pay | Admitting: Nurse Practitioner

## 2024-01-25 DIAGNOSIS — D631 Anemia in chronic kidney disease: Secondary | ICD-10-CM | POA: Diagnosis not present

## 2024-01-25 DIAGNOSIS — Z992 Dependence on renal dialysis: Secondary | ICD-10-CM | POA: Diagnosis not present

## 2024-01-25 DIAGNOSIS — D509 Iron deficiency anemia, unspecified: Secondary | ICD-10-CM | POA: Diagnosis not present

## 2024-01-25 DIAGNOSIS — N2581 Secondary hyperparathyroidism of renal origin: Secondary | ICD-10-CM | POA: Diagnosis not present

## 2024-01-25 DIAGNOSIS — N186 End stage renal disease: Secondary | ICD-10-CM | POA: Diagnosis not present

## 2024-01-25 LAB — T4, FREE: Free T4: 1.08 ng/dL (ref 0.82–1.77)

## 2024-01-25 LAB — THYROGLOBULIN ANTIBODY: Thyroglobulin Antibody: <1 [IU]/mL (ref 0.0–0.9)

## 2024-01-25 LAB — T3, FREE: T3, Free: 2.7 pg/mL (ref 2.0–4.4)

## 2024-01-25 LAB — TSH: TSH: 0.212 u[IU]/mL — ABNORMAL LOW (ref 0.450–4.500)

## 2024-01-25 LAB — THYROID PEROXIDASE ANTIBODY: Thyroperoxidase Ab SerPl-aCnc: 17 [IU]/mL (ref 0–34)

## 2024-01-25 NOTE — Progress Notes (Signed)
 FYI: I sent mychart message going over thyroid biopsy results.

## 2024-01-26 ENCOUNTER — Telehealth: Payer: Self-pay | Admitting: *Deleted

## 2024-01-26 NOTE — Progress Notes (Signed)
 Patient was called and I spoke with his wife. She was given Whitney's response and she is going to share with him when he gets home.

## 2024-01-26 NOTE — Telephone Encounter (Signed)
 Patient was called and I spoke with his wife. He was not available. She is going to share Whitney's  recommendation with him

## 2024-01-26 NOTE — Telephone Encounter (Signed)
-----   Message from Dani Gobble sent at 01/25/2024  6:48 AM EDT ----- Lorain Childes: I sent mychart message going over thyroid biopsy results.

## 2024-01-27 ENCOUNTER — Ambulatory Visit: Payer: Medicare Other | Admitting: Urology

## 2024-01-27 DIAGNOSIS — N2581 Secondary hyperparathyroidism of renal origin: Secondary | ICD-10-CM | POA: Diagnosis not present

## 2024-01-27 DIAGNOSIS — D631 Anemia in chronic kidney disease: Secondary | ICD-10-CM | POA: Diagnosis not present

## 2024-01-27 DIAGNOSIS — D509 Iron deficiency anemia, unspecified: Secondary | ICD-10-CM | POA: Diagnosis not present

## 2024-01-27 DIAGNOSIS — Z992 Dependence on renal dialysis: Secondary | ICD-10-CM | POA: Diagnosis not present

## 2024-01-27 DIAGNOSIS — N186 End stage renal disease: Secondary | ICD-10-CM | POA: Diagnosis not present

## 2024-01-28 DIAGNOSIS — N2581 Secondary hyperparathyroidism of renal origin: Secondary | ICD-10-CM | POA: Diagnosis not present

## 2024-01-28 DIAGNOSIS — D509 Iron deficiency anemia, unspecified: Secondary | ICD-10-CM | POA: Diagnosis not present

## 2024-01-28 DIAGNOSIS — D631 Anemia in chronic kidney disease: Secondary | ICD-10-CM | POA: Diagnosis not present

## 2024-01-28 DIAGNOSIS — N186 End stage renal disease: Secondary | ICD-10-CM | POA: Diagnosis not present

## 2024-01-28 DIAGNOSIS — Z992 Dependence on renal dialysis: Secondary | ICD-10-CM | POA: Diagnosis not present

## 2024-01-30 DIAGNOSIS — D631 Anemia in chronic kidney disease: Secondary | ICD-10-CM | POA: Diagnosis not present

## 2024-01-30 DIAGNOSIS — E785 Hyperlipidemia, unspecified: Secondary | ICD-10-CM | POA: Diagnosis not present

## 2024-01-30 DIAGNOSIS — N2581 Secondary hyperparathyroidism of renal origin: Secondary | ICD-10-CM | POA: Diagnosis not present

## 2024-01-30 DIAGNOSIS — E119 Type 2 diabetes mellitus without complications: Secondary | ICD-10-CM | POA: Diagnosis not present

## 2024-01-30 DIAGNOSIS — Z992 Dependence on renal dialysis: Secondary | ICD-10-CM | POA: Diagnosis not present

## 2024-01-30 DIAGNOSIS — D509 Iron deficiency anemia, unspecified: Secondary | ICD-10-CM | POA: Diagnosis not present

## 2024-01-30 DIAGNOSIS — Z794 Long term (current) use of insulin: Secondary | ICD-10-CM | POA: Diagnosis not present

## 2024-01-30 DIAGNOSIS — N186 End stage renal disease: Secondary | ICD-10-CM | POA: Diagnosis not present

## 2024-02-01 DIAGNOSIS — D631 Anemia in chronic kidney disease: Secondary | ICD-10-CM | POA: Diagnosis not present

## 2024-02-01 DIAGNOSIS — N186 End stage renal disease: Secondary | ICD-10-CM | POA: Diagnosis not present

## 2024-02-01 DIAGNOSIS — Z992 Dependence on renal dialysis: Secondary | ICD-10-CM | POA: Diagnosis not present

## 2024-02-01 DIAGNOSIS — N2581 Secondary hyperparathyroidism of renal origin: Secondary | ICD-10-CM | POA: Diagnosis not present

## 2024-02-01 DIAGNOSIS — D509 Iron deficiency anemia, unspecified: Secondary | ICD-10-CM | POA: Diagnosis not present

## 2024-02-03 DIAGNOSIS — Z992 Dependence on renal dialysis: Secondary | ICD-10-CM | POA: Diagnosis not present

## 2024-02-03 DIAGNOSIS — N2581 Secondary hyperparathyroidism of renal origin: Secondary | ICD-10-CM | POA: Diagnosis not present

## 2024-02-03 DIAGNOSIS — D509 Iron deficiency anemia, unspecified: Secondary | ICD-10-CM | POA: Diagnosis not present

## 2024-02-03 DIAGNOSIS — D631 Anemia in chronic kidney disease: Secondary | ICD-10-CM | POA: Diagnosis not present

## 2024-02-03 DIAGNOSIS — N186 End stage renal disease: Secondary | ICD-10-CM | POA: Diagnosis not present

## 2024-02-06 DIAGNOSIS — D509 Iron deficiency anemia, unspecified: Secondary | ICD-10-CM | POA: Diagnosis not present

## 2024-02-06 DIAGNOSIS — N2581 Secondary hyperparathyroidism of renal origin: Secondary | ICD-10-CM | POA: Diagnosis not present

## 2024-02-06 DIAGNOSIS — Z992 Dependence on renal dialysis: Secondary | ICD-10-CM | POA: Diagnosis not present

## 2024-02-06 DIAGNOSIS — N186 End stage renal disease: Secondary | ICD-10-CM | POA: Diagnosis not present

## 2024-02-06 DIAGNOSIS — D631 Anemia in chronic kidney disease: Secondary | ICD-10-CM | POA: Diagnosis not present

## 2024-02-07 DIAGNOSIS — Z992 Dependence on renal dialysis: Secondary | ICD-10-CM | POA: Diagnosis not present

## 2024-02-07 DIAGNOSIS — D631 Anemia in chronic kidney disease: Secondary | ICD-10-CM | POA: Diagnosis not present

## 2024-02-07 DIAGNOSIS — N186 End stage renal disease: Secondary | ICD-10-CM | POA: Diagnosis not present

## 2024-02-07 DIAGNOSIS — N2581 Secondary hyperparathyroidism of renal origin: Secondary | ICD-10-CM | POA: Diagnosis not present

## 2024-02-07 DIAGNOSIS — H35033 Hypertensive retinopathy, bilateral: Secondary | ICD-10-CM | POA: Diagnosis not present

## 2024-02-08 ENCOUNTER — Ambulatory Visit: Payer: Medicare Other | Admitting: Nurse Practitioner

## 2024-02-08 DIAGNOSIS — N186 End stage renal disease: Secondary | ICD-10-CM | POA: Diagnosis not present

## 2024-02-08 DIAGNOSIS — D631 Anemia in chronic kidney disease: Secondary | ICD-10-CM | POA: Diagnosis not present

## 2024-02-08 DIAGNOSIS — Z992 Dependence on renal dialysis: Secondary | ICD-10-CM | POA: Diagnosis not present

## 2024-02-08 DIAGNOSIS — N2581 Secondary hyperparathyroidism of renal origin: Secondary | ICD-10-CM | POA: Diagnosis not present

## 2024-02-10 DIAGNOSIS — N186 End stage renal disease: Secondary | ICD-10-CM | POA: Diagnosis not present

## 2024-02-10 DIAGNOSIS — Z992 Dependence on renal dialysis: Secondary | ICD-10-CM | POA: Diagnosis not present

## 2024-02-10 DIAGNOSIS — N2581 Secondary hyperparathyroidism of renal origin: Secondary | ICD-10-CM | POA: Diagnosis not present

## 2024-02-10 DIAGNOSIS — D631 Anemia in chronic kidney disease: Secondary | ICD-10-CM | POA: Diagnosis not present

## 2024-02-13 DIAGNOSIS — D631 Anemia in chronic kidney disease: Secondary | ICD-10-CM | POA: Diagnosis not present

## 2024-02-13 DIAGNOSIS — N186 End stage renal disease: Secondary | ICD-10-CM | POA: Diagnosis not present

## 2024-02-13 DIAGNOSIS — N2581 Secondary hyperparathyroidism of renal origin: Secondary | ICD-10-CM | POA: Diagnosis not present

## 2024-02-13 DIAGNOSIS — Z992 Dependence on renal dialysis: Secondary | ICD-10-CM | POA: Diagnosis not present

## 2024-02-14 ENCOUNTER — Encounter: Payer: Self-pay | Admitting: Nurse Practitioner

## 2024-02-14 ENCOUNTER — Ambulatory Visit (INDEPENDENT_AMBULATORY_CARE_PROVIDER_SITE_OTHER): Payer: Medicare Other | Admitting: Nurse Practitioner

## 2024-02-14 VITALS — BP 102/60 | HR 68 | Ht 74.0 in | Wt 250.8 lb

## 2024-02-14 DIAGNOSIS — E042 Nontoxic multinodular goiter: Secondary | ICD-10-CM

## 2024-02-14 DIAGNOSIS — E0590 Thyrotoxicosis, unspecified without thyrotoxic crisis or storm: Secondary | ICD-10-CM | POA: Diagnosis not present

## 2024-02-14 NOTE — Progress Notes (Signed)
 Endocrinology Follow Up Note 02/14/24    ---------------------------------------------------------------------------------------------------------------------- Subjective    Past Medical History:  Diagnosis Date   Arthritis    Cancer (HCC)    prostate   Cardiomyopathy    secondary   CKD (chronic kidney disease)    DM2 (diabetes mellitus, type 2) (HCC)    Gout    HTN (hypertension)    unspec   Hypercholesterolemia    Nonischemic cardiomyopathy (HCC)    a. EF 40-50% by most recent 2-D echo b. EF 25% by cardiac cath in 2004   Overweight(278.02)     Past Surgical History:  Procedure Laterality Date   A/V FISTULAGRAM Left 01/04/2024   Procedure: A/V Fistulagram;  Surgeon: Annice Needy, MD;  Location: [facility];  Service: Cardiovascular;  Laterality: Left;   AV FISTULA PLACEMENT Left 06/06/2020   Procedure: LEFT ARM ARTERIOVENOUS (AV) GRAFT USING 45CM GORTEX;  Surgeon: Larina Earthly, MD;  Location: [facility];  Service: Vascular;  Laterality: Left;   AV FISTULA PLACEMENT Left 06/08/2022   Procedure: INSERTION OF ARTERIOVENOUS GORE-TEX GRAFT UPPER ARM;  Surgeon: Larina Earthly, MD;  Location: [facility];  Service: Vascular;  Laterality: Left;   AVGG REMOVAL Left 07/27/2022   Procedure: EXCISION OF INFECTED PORTION OF LEFT FOREARM ARTERIOVENOUS GORETEX GRAFT (AVGG);  Surgeon: Larina Earthly, MD;  Location: [facility];  Service: Vascular;  Laterality: Left;   COLONOSCOPY     DIALYSIS/PERMA CATHETER INSERTION Right 01/04/2024   Procedure: DIALYSIS/PERMA CATHETER INSERTION;  Surgeon: Annice Needy, MD;  Location: [facility];  Service: Cardiovascular;  Laterality: Right;   HERNIA REPAIR     INSERTION OF DIALYSIS CATHETER Right 06/06/2020   Procedure: INSERTION OF DIALYSIS CATHETER USING 23CM DOUBLE LUMEN CATHETER;  Surgeon: Larina Earthly, MD;  [facility]: [facility];  Service: Vascular;  Laterality: Right;   IR ANGIOGRAM EXTREMITY LEFT  10/25/2022   IR FLUORO GUIDE CV LINE RIGHT  05/20/2020    IR THROMBECTOMY AV FISTULA W/THROMBOLYSIS/PTA INC/SHUNT/IMG LEFT Left 10/25/2022   IR THROMBECTOMY AV FISTULA W/THROMBOLYSIS/PTA INC/SHUNT/IMG LEFT Left 08/05/2023   IR US GUIDE VASC ACCESS LEFT  10/25/2022   IR US GUIDE VASC ACCESS LEFT  08/09/2023   IR US GUIDE VASC ACCESS RIGHT  05/20/2020   KNEE ARTHROPLASTY Left 08/08/2017   Procedure: LEFT TOTAL KNEE ARTHROPLASTY WITH COMPUTER NAVIGATION;  Surgeon: Samson Frederic, MD;  [facility]: [facility];  Service: Orthopedics;  Laterality: Left;  Needs RNFA   KNEE ARTHROPLASTY Right 11/17/2017   Procedure: RIGHT TOTAL KNEE ARTHROPLASTY WITH COMPUTER NAVIGATION;  Surgeon: Samson Frederic, MD;  Location: [facility];  Service: Orthopedics;  Laterality: Right;  NEEDS RNFA   PERIPHERAL VASCULAR BALLOON ANGIOPLASTY Left 01/10/2024   Procedure: PERIPHERAL VASCULAR BALLOON ANGIOPLASTY;  Surgeon: Ethelene Hal, MD;  [facility];  Service: Cardiovascular;  Laterality: Left;  VA, intragraft, AA   PERIPHERAL VASCULAR THROMBECTOMY Left 08/22/2023   Procedure: PERIPHERAL VASCULAR THROMBECTOMY;  Surgeon: Annice Needy, MD;  Location: [facility];  Service: Cardiovascular;  Laterality: Left;   PERIPHERAL VASCULAR THROMBECTOMY N/A 01/10/2024   Procedure: PERIPHERAL VASCULAR THROMBECTOMY;  Surgeon: Ethelene Hal, MD;  [facility];  Service: Cardiovascular;  Laterality: N/A;   REMOVAL OF A DIALYSIS CATHETER Right 09/07/2022   Procedure: MINOR REMOVAL OF A TUNNELED DIALYSIS CATHETER;  Surgeon: Larina Earthly, MD;  Location: [facility];  Service: Vascular;  Laterality: Right;   ROBOT ASSISTED LAPAROSCOPIC NEPHRECTOMY Right 05/16/2020  Procedure: XI ROBOTIC ASSISTED LAPAROSCOPIC NEPHRECTOMY;  Surgeon: Malen Gauze, MD;  Location: [facility];  Service: Urology;  Laterality: Right;  2.5 hrs    Social History   Socioeconomic History   Marital status: Married    Spouse name: Not on file   Number of children: 2   Years of education: Not on file   Highest  education level: Not on file  Occupational History   Occupation: retired  Tobacco Use   Smoking status: Former    Current packs/day: 0.00    Average packs/day: 0.5 packs/day for 20.0 years (10.0 ttl pk-yrs)    Types: Cigarettes    Start date: 02/12/1966    Quit date: 11/08/1984    Years since quitting: 39.2   Smokeless tobacco: Never  Vaping Use   Vaping status: Never Used  Substance and Sexual Activity   Alcohol use: No    Alcohol/week: 0.0 standard drinks of alcohol   Drug use: No   Sexual activity: Yes    Birth control/protection: None  Other Topics Concern   Not on file  Social History Narrative   Full time.    Social Drivers of Corporate investment banker Strain: Not on file  Food Insecurity: No Food Insecurity (01/03/2024)   Hunger Vital Sign    Worried About Running Out of Food in the Last Year: Never true    Ran Out of Food in the Last Year: Never true  Transportation Needs: No Transportation Needs (01/03/2024)   PRAPARE - Administrator, Civil Service (Medical): No    Lack of Transportation (Non-Medical): No  Physical Activity: Not on file  Stress: No Stress Concern Present (01/27/2021)   Received from [facility] - Occupational Stress Questionnaire    Feeling of Stress : Only a little  Social Connections: Socially Integrated (01/03/2024)   Social Connection and Isolation Panel [NHANES]    Frequency of Communication with Friends and Family: More than three times a week    Frequency of Social Gatherings with Friends and Family: More than three times a week    Attends Religious Services: More than 4 times per year    Active Member of [employer] or Organizations: Yes    Attends Engineer, structural: More than 4 times per year    Marital Status: Married  Catering manager Violence: Not At Risk (01/03/2024)   Humiliation, Afraid, Rape, and Kick questionnaire    Fear of Current or Ex-Partner: No     Emotionally Abused: No    Physically Abused: No    Sexually Abused: No    Current Outpatient Medications on File Prior to Visit  Medication Sig Dispense Refill   acetaminophen (TYLENOL) 500 MG tablet Take 500-1,000 mg by mouth every 6 (six) hours as needed for moderate pain.     albuterol (VENTOLIN HFA) 108 (90 Base) MCG/ACT inhaler Inhale 1-2 puffs into the lungs every 6 (six) hours as needed for wheezing or shortness of breath. (Patient taking differently: Inhale 1-2 puffs into the lungs 2 (two) times daily.) 6.7 g 1   allopurinol (ZYLOPRIM) 300 MG tablet Take 300 mg by mouth daily.     atorvastatin (LIPITOR) 40 MG tablet Take 1 tablet (40 mg total) by mouth daily. (Patient taking differently: Take 40 mg by mouth every Monday, Wednesday, and Friday.) 90 tablet 3   b complex-vitamin c-folic acid (NEPHRO-VITE) 0.8 MG TABS tablet Take 1 tablet by mouth at bedtime.  BREO ELLIPTA 100-25 MCG/ACT AEPB Inhale 1 puff into the lungs daily.     calcium acetate (PHOSLO) 667 MG capsule Take 3 capsules (2,001 mg total) by mouth 3 (three) times daily with meals. 180 capsule 1   cetirizine (ZYRTEC) 10 MG tablet Take 10 mg by mouth daily.     Cholecalciferol (VITAMIN D) 50 MCG (2000 UT) tablet Take 1 tablet (2,000 Units total) by mouth daily. 30 tablet 0   Colchicine 0.6 MG CAPS Take 1 capsule (0.6 mg total) by mouth 2 (two) times daily as needed. Home med.     methocarbamol (ROBAXIN) 500 MG tablet Take 500 mg by mouth 3 (three) times a week. Take Sun, Tue, and Thurs (day before dialysis)     midodrine (PROAMATINE) 10 MG tablet TAKE 1 TABLET BY MOUTH THREE TIMES DAILY WITH MEALS 270 tablet 3   Multiple Vitamins-Minerals (CENTRUM SILVER 50+MEN) TABS Take 1 tablet by mouth daily.     Naphazoline HCl (CLEAR EYES OP) Place 1 drop into both eyes daily.     Polyethylene Glycol 3350 (MIRALAX PO) Take 17 g by mouth daily as needed (constipation).     pramipexole (MIRAPEX) 0.5 MG tablet Take 0.5 mg by mouth at  bedtime.     senna (SENOKOT) 8.6 MG tablet Take 1-2 tablets by mouth daily as needed for constipation.     tamsulosin (FLOMAX) 0.4 MG CAPS capsule Take 1 capsule (0.4 mg total) by mouth daily. 90 capsule 3   trolamine salicylate (ASPERCREME) 10 % cream Apply 1 Application topically as needed for muscle pain.     No current facility-administered medications on file prior to visit.      HPI   Todd Robertson is a 78 y.o.-year-old male, referred by his PCP, Dr. Sherryll Burger, for follow up after initial evaluation for multinodular goiter.  He is accompanied by his wife today.  Thyroid U/S: 07/07/23 Nodules 1 and 2 located within the right thyroid lobe, meet criteria for FNA.  The above is in keeping with the ACR TI-RADS recommendations - J Am Coll Radiol 2017;14:587-595.   Electronically Signed   By: Acquanetta Belling M.D.   On: 07/07/2023 12:18 Narrative  CLINICAL DATA:  Thyromegaly  EXAM: THYROID ULTRASOUND  TECHNIQUE: Ultrasound examination of the thyroid gland and adjacent soft tissues was performed.  COMPARISON:  None available.  FINDINGS: Parenchymal Echotexture: Mildly heterogenous  Isthmus: 1.0 cm  Right lobe: 6.4 x 4.2 x 5.5 cm  Left lobe: 5.7 x 1.8 x 2.7 cm  _________________________________________________________  Estimated total number of nodules >/= 1 cm: 2  Number of spongiform nodules >/=  2 cm not described below (TR1): 0  Number of mixed cystic and solid nodules >/= 1.5 cm not described below (TR2): 0  _________________________________________________________  Nodule # 1:  Location: Right; superior  Maximum size: 3.1 cm; Other 2 dimensions: 1.2 x 2.3 cm  Composition: solid/almost completely solid (2)  Echogenicity: isoechoic (1)  Shape: not taller-than-wide (0)  Margins: ill-defined (0)  Echogenic foci: none (0)  ACR TI-RADS total points: 3.  ACR TI-RADS risk category: TR3 (3 points).  ACR TI-RADS recommendations:  **Given size (>/= 2.5  cm) and appearance, fine needle aspiration of this mildly suspicious nodule should be considered based on TI-RADS criteria.  _________________________________________________________  Nodule # 2:  Location: Right; mid  Maximum size: 4.7 cm; Other 2 dimensions: 2.6 x 4.0 cm  Composition: solid/almost completely solid (2)  Echogenicity: isoechoic (1)  Shape: not taller-than-wide (0)  Margins: ill-defined (  0)  Echogenic foci: none (0)  ACR TI-RADS total points: 3.  ACR TI-RADS risk category: TR3 (3 points).  ACR TI-RADS recommendations:  **Given size (>/= 2.5 cm) and appearance, fine needle aspiration of this mildly suspicious nodule should be considered based on TI-RADS criteria.  _________________________________________________________  Nodule 3: 0.9 x 0.7 x 0.9 cm predominantly cystic left superior thyroid nodule does not meet criteria for imaging surveillance or FNA. Procedure Note  Mir, Jeannine Kitten, MD - 07/07/2023 Formatting of this note might be different from the original. CLINICAL DATA:  Thyromegaly  EXAM: THYROID ULTRASOUND  TECHNIQUE: Ultrasound examination of the thyroid gland and adjacent soft tissues was performed.  COMPARISON:  None available.  FINDINGS: Parenchymal Echotexture: Mildly heterogenous  Isthmus: 1.0 cm  Right lobe: 6.4 x 4.2 x 5.5 cm  Left lobe: 5.7 x 1.8 x 2.7 cm  _________________________________________________________  Estimated total number of nodules >/= 1 cm: 2  Number of spongiform nodules >/=  2 cm not described below (TR1): 0  Number of mixed cystic and solid nodules >/= 1.5 cm not described below (TR2): 0  _________________________________________________________  Nodule # 1:  Location: Right; superior  Maximum size: 3.1 cm; Other 2 dimensions: 1.2 x 2.3 cm  Composition: solid/almost completely solid (2)  Echogenicity: isoechoic (1)  Shape: not taller-than-wide (0)  Margins: ill-defined  (0)  Echogenic foci: none (0)  ACR TI-RADS total points: 3.  ACR TI-RADS risk category: TR3 (3 points).  ACR TI-RADS recommendations:  **Given size (>/= 2.5 cm) and appearance, fine needle aspiration of this mildly suspicious nodule should be considered based on TI-RADS criteria.  _________________________________________________________  Nodule # 2:  Location: Right; mid  Maximum size: 4.7 cm; Other 2 dimensions: 2.6 x 4.0 cm  Composition: solid/almost completely solid (2)  Echogenicity: isoechoic (1)  Shape: not taller-than-wide (0)  Margins: ill-defined (0)  Echogenic foci: none (0)  ACR TI-RADS total points: 3.  ACR TI-RADS risk category: TR3 (3 points).  ACR TI-RADS recommendations:  **Given size (>/= 2.5 cm) and appearance, fine needle aspiration of this mildly suspicious nodule should be considered based on TI-RADS criteria.  _________________________________________________________  Nodule 3: 0.9 x 0.7 x 0.9 cm predominantly cystic left superior thyroid nodule does not meet criteria for imaging surveillance or FNA.  IMPRESSION: Nodules 1 and 2 located within the right thyroid lobe, meet criteria for FNA.  The above is in keeping with the ACR TI-RADS recommendations - J Am Coll Radiol 2017;14:587-595.   Electronically Signed   By: Acquanetta Belling M.D.   On: 07/07/2023 12:18 Exam End: 07/07/23 11:57   Specimen Collected: 07/07/23 12:13 Last Resulted: 07/07/23 12:18  Received From: [facility]  Result Received: 07/12/23 07:20    I reviewed pt's thyroid tests: Lab Results  Component Value Date   TSH 0.212 (L) 01/24/2024   TSH 0.53 10/27/2023   FREET4 1.08 01/24/2024     Pt c/o: - fatigue - SOB with exertion - hoarseness  He does have FH of thyroid disease in his sister (hypothyroidism). No FH of thyroid cancer. No h/o radiation tx to head or neck.  He did have radiation treatment for prostate cancer in the past.  No seaweed or kelp.  No recent contrast studies. No steroid use. No herbal supplements. No Biotin supplements or Hair, Skin and Nails vitamins.  Pt also has a history of cardiomyopathy, Afib, DM2, OA, ESRD, renal cell carcinoma, anemia, COPD.  Review of systems  Constitutional: + Minimally fluctuating body weight,  current  Body mass index is 32.2 kg/m. , + fatigue, no subjective hyperthermia, no subjective hypothermia Eyes: no blurry vision, no xerophthalmia ENT: no sore throat, no nodules palpated in throat, no dysphagia/odynophagia, + hoarseness Cardiovascular: no chest pain, + shortness of breath-Hx cardiomyopathy, no palpitations, no leg swelling, has HD access in left arm Respiratory: no cough, + shortness of breath (hx COPD), intermittent wheezes Gastrointestinal: no nausea/vomiting/diarrhea Musculoskeletal: no muscle/joint aches, walks with cane Skin: no rashes, no hyperemia Neurological: no tremors, no numbness, no tingling, no dizziness Psychiatric: no depression, no anxiety  ---------------------------------------------------------------------------------------------------------------------- Objective    BP 102/60 (BP Location: Right Arm, Patient Position: Sitting, Cuff Size: Large)   Pulse 68   Ht 6\' 2"  (1.88 m)   Wt 250 lb 12.8 oz (113.8 kg)   BMI 32.20 kg/m    BP Readings from Last 3 Encounters:  02/14/24 102/60  01/10/24 131/78  01/04/24 115/68    Wt Readings from Last 3 Encounters:  02/14/24 250 lb 12.8 oz (113.8 kg)  01/10/24 255 lb (115.7 kg)  01/04/24 249 lb 12.5 oz (113.3 kg)    Physical Exam- Limited  Constitutional:  Body mass index is 32.2 kg/m. , not in acute distress, normal state of mind Eyes:  EOMI, no exophthalmos Musculoskeletal: no gross deformities, strength intact in all four extremities, no gross restriction of joint movements Skin:  no rashes, no hyperemia Neurological: no tremor with outstretched hands    Latest Reference Range & Units 10/27/23 00:00  01/24/24 14:57  TSH 0.450 - 4.500 uIU/mL 0.53 (E) 0.212 (L)  Triiodothyronine,Free,Serum 2.0 - 4.4 pg/mL  2.7  T4,Free(Direct) 0.82 - 1.77 ng/dL  4.09  Thyroperoxidase Ab SerPl-aCnc 0 - 34 IU/mL  17  Thyroglobulin Antibody 0.0 - 0.9 IU/mL  <1.0  (L): Data is abnormally low (E): External lab result ----------------------------------------------------------------------------------------------------------------------  ASSESSMENT / PLAN:  1. Thyroid Nodule 2. Subclinical hyperthyroidism  He did have FNA of suspicious thyroid nodules in RML and RLL both of which pathology confirms to be benign follicular nodule.  Will plan to repeat thyroid ultrasound in 1 year for surveillance.  His thyroid antibody testing was negative, ruling out autoimmune thyroid dysfunction.  His TSH was slightly low but FT4 and FT3 levels were normal, consistent with subclinical hyperthyroidism.  He does NOT need antithyroid treatment or thyroid hormone replacement therapy.  Will monitor TFTs in 4 months to reassess.   Follow Up Plan: Return in about 4 months (around 06/15/2024) for Thyroid follow up, Previsit labs.     I spent  38  minutes in the care of the patient today including review of labs from Thyroid Function, CMP, and other relevant labs ; imaging/biopsy records (current and previous including abstractions from other facilities); face-to-face time discussing  his lab results and symptoms, medications doses, his options of short and long term treatment based on the latest standards of care / guidelines;   and documenting the encounter.  Salley Scarlet  participated in the discussions, expressed understanding, and voiced agreement with the above plans.  All questions were answered to his satisfaction. he is encouraged to contact clinic should he have any questions or concerns prior to his return visit.    Ronny Bacon, [facility] [address] [place], [place]  [address] Phone: [phone] Fax: [phone]

## 2024-02-15 DIAGNOSIS — Z992 Dependence on renal dialysis: Secondary | ICD-10-CM | POA: Diagnosis not present

## 2024-02-15 DIAGNOSIS — N2581 Secondary hyperparathyroidism of renal origin: Secondary | ICD-10-CM | POA: Diagnosis not present

## 2024-02-15 DIAGNOSIS — D631 Anemia in chronic kidney disease: Secondary | ICD-10-CM | POA: Diagnosis not present

## 2024-02-15 DIAGNOSIS — N186 End stage renal disease: Secondary | ICD-10-CM | POA: Diagnosis not present

## 2024-02-17 DIAGNOSIS — N186 End stage renal disease: Secondary | ICD-10-CM | POA: Diagnosis not present

## 2024-02-17 DIAGNOSIS — N2581 Secondary hyperparathyroidism of renal origin: Secondary | ICD-10-CM | POA: Diagnosis not present

## 2024-02-17 DIAGNOSIS — Z992 Dependence on renal dialysis: Secondary | ICD-10-CM | POA: Diagnosis not present

## 2024-02-17 DIAGNOSIS — D631 Anemia in chronic kidney disease: Secondary | ICD-10-CM | POA: Diagnosis not present

## 2024-02-20 DIAGNOSIS — N2581 Secondary hyperparathyroidism of renal origin: Secondary | ICD-10-CM | POA: Diagnosis not present

## 2024-02-20 DIAGNOSIS — N186 End stage renal disease: Secondary | ICD-10-CM | POA: Diagnosis not present

## 2024-02-20 DIAGNOSIS — D631 Anemia in chronic kidney disease: Secondary | ICD-10-CM | POA: Diagnosis not present

## 2024-02-20 DIAGNOSIS — Z992 Dependence on renal dialysis: Secondary | ICD-10-CM | POA: Diagnosis not present

## 2024-02-22 DIAGNOSIS — N2581 Secondary hyperparathyroidism of renal origin: Secondary | ICD-10-CM | POA: Diagnosis not present

## 2024-02-22 DIAGNOSIS — D631 Anemia in chronic kidney disease: Secondary | ICD-10-CM | POA: Diagnosis not present

## 2024-02-22 DIAGNOSIS — N186 End stage renal disease: Secondary | ICD-10-CM | POA: Diagnosis not present

## 2024-02-22 DIAGNOSIS — Z992 Dependence on renal dialysis: Secondary | ICD-10-CM | POA: Diagnosis not present

## 2024-02-24 DIAGNOSIS — Z992 Dependence on renal dialysis: Secondary | ICD-10-CM | POA: Diagnosis not present

## 2024-02-24 DIAGNOSIS — D631 Anemia in chronic kidney disease: Secondary | ICD-10-CM | POA: Diagnosis not present

## 2024-02-24 DIAGNOSIS — N186 End stage renal disease: Secondary | ICD-10-CM | POA: Diagnosis not present

## 2024-02-24 DIAGNOSIS — N2581 Secondary hyperparathyroidism of renal origin: Secondary | ICD-10-CM | POA: Diagnosis not present

## 2024-02-25 DIAGNOSIS — E8770 Fluid overload, unspecified: Secondary | ICD-10-CM | POA: Diagnosis not present

## 2024-02-25 DIAGNOSIS — N186 End stage renal disease: Secondary | ICD-10-CM | POA: Diagnosis not present

## 2024-02-25 DIAGNOSIS — Z992 Dependence on renal dialysis: Secondary | ICD-10-CM | POA: Diagnosis not present

## 2024-02-27 DIAGNOSIS — Z992 Dependence on renal dialysis: Secondary | ICD-10-CM | POA: Diagnosis not present

## 2024-02-27 DIAGNOSIS — D631 Anemia in chronic kidney disease: Secondary | ICD-10-CM | POA: Diagnosis not present

## 2024-02-27 DIAGNOSIS — N2581 Secondary hyperparathyroidism of renal origin: Secondary | ICD-10-CM | POA: Diagnosis not present

## 2024-02-27 DIAGNOSIS — N186 End stage renal disease: Secondary | ICD-10-CM | POA: Diagnosis not present

## 2024-02-28 DIAGNOSIS — N186 End stage renal disease: Secondary | ICD-10-CM | POA: Diagnosis not present

## 2024-02-28 DIAGNOSIS — E875 Hyperkalemia: Secondary | ICD-10-CM | POA: Diagnosis not present

## 2024-02-29 DIAGNOSIS — D631 Anemia in chronic kidney disease: Secondary | ICD-10-CM | POA: Diagnosis not present

## 2024-02-29 DIAGNOSIS — N2581 Secondary hyperparathyroidism of renal origin: Secondary | ICD-10-CM | POA: Diagnosis not present

## 2024-02-29 DIAGNOSIS — N186 End stage renal disease: Secondary | ICD-10-CM | POA: Diagnosis not present

## 2024-02-29 DIAGNOSIS — Z992 Dependence on renal dialysis: Secondary | ICD-10-CM | POA: Diagnosis not present

## 2024-03-02 DIAGNOSIS — Z992 Dependence on renal dialysis: Secondary | ICD-10-CM | POA: Diagnosis not present

## 2024-03-02 DIAGNOSIS — N186 End stage renal disease: Secondary | ICD-10-CM | POA: Diagnosis not present

## 2024-03-02 DIAGNOSIS — N2581 Secondary hyperparathyroidism of renal origin: Secondary | ICD-10-CM | POA: Diagnosis not present

## 2024-03-02 DIAGNOSIS — D631 Anemia in chronic kidney disease: Secondary | ICD-10-CM | POA: Diagnosis not present

## 2024-03-05 DIAGNOSIS — D631 Anemia in chronic kidney disease: Secondary | ICD-10-CM | POA: Diagnosis not present

## 2024-03-05 DIAGNOSIS — Z992 Dependence on renal dialysis: Secondary | ICD-10-CM | POA: Diagnosis not present

## 2024-03-05 DIAGNOSIS — N2581 Secondary hyperparathyroidism of renal origin: Secondary | ICD-10-CM | POA: Diagnosis not present

## 2024-03-05 DIAGNOSIS — N186 End stage renal disease: Secondary | ICD-10-CM | POA: Diagnosis not present

## 2024-03-07 DIAGNOSIS — D631 Anemia in chronic kidney disease: Secondary | ICD-10-CM | POA: Diagnosis not present

## 2024-03-07 DIAGNOSIS — N2581 Secondary hyperparathyroidism of renal origin: Secondary | ICD-10-CM | POA: Diagnosis not present

## 2024-03-07 DIAGNOSIS — Z992 Dependence on renal dialysis: Secondary | ICD-10-CM | POA: Diagnosis not present

## 2024-03-07 DIAGNOSIS — N186 End stage renal disease: Secondary | ICD-10-CM | POA: Diagnosis not present

## 2024-03-08 DIAGNOSIS — Z299 Encounter for prophylactic measures, unspecified: Secondary | ICD-10-CM | POA: Diagnosis not present

## 2024-03-08 DIAGNOSIS — E1169 Type 2 diabetes mellitus with other specified complication: Secondary | ICD-10-CM | POA: Diagnosis not present

## 2024-03-08 DIAGNOSIS — I10 Essential (primary) hypertension: Secondary | ICD-10-CM | POA: Diagnosis not present

## 2024-03-08 DIAGNOSIS — N186 End stage renal disease: Secondary | ICD-10-CM | POA: Diagnosis not present

## 2024-03-08 DIAGNOSIS — L309 Dermatitis, unspecified: Secondary | ICD-10-CM | POA: Diagnosis not present

## 2024-03-08 DIAGNOSIS — J449 Chronic obstructive pulmonary disease, unspecified: Secondary | ICD-10-CM | POA: Diagnosis not present

## 2024-03-09 DIAGNOSIS — D509 Iron deficiency anemia, unspecified: Secondary | ICD-10-CM | POA: Diagnosis not present

## 2024-03-09 DIAGNOSIS — N186 End stage renal disease: Secondary | ICD-10-CM | POA: Diagnosis not present

## 2024-03-09 DIAGNOSIS — N250 Renal osteodystrophy: Secondary | ICD-10-CM | POA: Diagnosis not present

## 2024-03-09 DIAGNOSIS — Z992 Dependence on renal dialysis: Secondary | ICD-10-CM | POA: Diagnosis not present

## 2024-03-09 DIAGNOSIS — D631 Anemia in chronic kidney disease: Secondary | ICD-10-CM | POA: Diagnosis not present

## 2024-03-09 DIAGNOSIS — N2581 Secondary hyperparathyroidism of renal origin: Secondary | ICD-10-CM | POA: Diagnosis not present

## 2024-03-12 DIAGNOSIS — D509 Iron deficiency anemia, unspecified: Secondary | ICD-10-CM | POA: Diagnosis not present

## 2024-03-12 DIAGNOSIS — N186 End stage renal disease: Secondary | ICD-10-CM | POA: Diagnosis not present

## 2024-03-12 DIAGNOSIS — N250 Renal osteodystrophy: Secondary | ICD-10-CM | POA: Diagnosis not present

## 2024-03-12 DIAGNOSIS — Z992 Dependence on renal dialysis: Secondary | ICD-10-CM | POA: Diagnosis not present

## 2024-03-12 DIAGNOSIS — D631 Anemia in chronic kidney disease: Secondary | ICD-10-CM | POA: Diagnosis not present

## 2024-03-12 DIAGNOSIS — N2581 Secondary hyperparathyroidism of renal origin: Secondary | ICD-10-CM | POA: Diagnosis not present

## 2024-03-14 DIAGNOSIS — D509 Iron deficiency anemia, unspecified: Secondary | ICD-10-CM | POA: Diagnosis not present

## 2024-03-14 DIAGNOSIS — D631 Anemia in chronic kidney disease: Secondary | ICD-10-CM | POA: Diagnosis not present

## 2024-03-14 DIAGNOSIS — Z992 Dependence on renal dialysis: Secondary | ICD-10-CM | POA: Diagnosis not present

## 2024-03-14 DIAGNOSIS — N186 End stage renal disease: Secondary | ICD-10-CM | POA: Diagnosis not present

## 2024-03-14 DIAGNOSIS — N2581 Secondary hyperparathyroidism of renal origin: Secondary | ICD-10-CM | POA: Diagnosis not present

## 2024-03-14 DIAGNOSIS — N250 Renal osteodystrophy: Secondary | ICD-10-CM | POA: Diagnosis not present

## 2024-03-16 DIAGNOSIS — N250 Renal osteodystrophy: Secondary | ICD-10-CM | POA: Diagnosis not present

## 2024-03-16 DIAGNOSIS — D631 Anemia in chronic kidney disease: Secondary | ICD-10-CM | POA: Diagnosis not present

## 2024-03-16 DIAGNOSIS — D509 Iron deficiency anemia, unspecified: Secondary | ICD-10-CM | POA: Diagnosis not present

## 2024-03-16 DIAGNOSIS — N186 End stage renal disease: Secondary | ICD-10-CM | POA: Diagnosis not present

## 2024-03-16 DIAGNOSIS — N2581 Secondary hyperparathyroidism of renal origin: Secondary | ICD-10-CM | POA: Diagnosis not present

## 2024-03-16 DIAGNOSIS — Z992 Dependence on renal dialysis: Secondary | ICD-10-CM | POA: Diagnosis not present

## 2024-03-17 DIAGNOSIS — Z992 Dependence on renal dialysis: Secondary | ICD-10-CM | POA: Diagnosis not present

## 2024-03-17 DIAGNOSIS — N250 Renal osteodystrophy: Secondary | ICD-10-CM | POA: Diagnosis not present

## 2024-03-17 DIAGNOSIS — N2581 Secondary hyperparathyroidism of renal origin: Secondary | ICD-10-CM | POA: Diagnosis not present

## 2024-03-17 DIAGNOSIS — D509 Iron deficiency anemia, unspecified: Secondary | ICD-10-CM | POA: Diagnosis not present

## 2024-03-17 DIAGNOSIS — N186 End stage renal disease: Secondary | ICD-10-CM | POA: Diagnosis not present

## 2024-03-17 DIAGNOSIS — D631 Anemia in chronic kidney disease: Secondary | ICD-10-CM | POA: Diagnosis not present

## 2024-03-19 DIAGNOSIS — Z992 Dependence on renal dialysis: Secondary | ICD-10-CM | POA: Diagnosis not present

## 2024-03-19 DIAGNOSIS — N186 End stage renal disease: Secondary | ICD-10-CM | POA: Diagnosis not present

## 2024-03-19 DIAGNOSIS — D631 Anemia in chronic kidney disease: Secondary | ICD-10-CM | POA: Diagnosis not present

## 2024-03-19 DIAGNOSIS — N250 Renal osteodystrophy: Secondary | ICD-10-CM | POA: Diagnosis not present

## 2024-03-19 DIAGNOSIS — D509 Iron deficiency anemia, unspecified: Secondary | ICD-10-CM | POA: Diagnosis not present

## 2024-03-19 DIAGNOSIS — N2581 Secondary hyperparathyroidism of renal origin: Secondary | ICD-10-CM | POA: Diagnosis not present

## 2024-03-21 DIAGNOSIS — Z992 Dependence on renal dialysis: Secondary | ICD-10-CM | POA: Diagnosis not present

## 2024-03-21 DIAGNOSIS — D631 Anemia in chronic kidney disease: Secondary | ICD-10-CM | POA: Diagnosis not present

## 2024-03-21 DIAGNOSIS — N250 Renal osteodystrophy: Secondary | ICD-10-CM | POA: Diagnosis not present

## 2024-03-21 DIAGNOSIS — D509 Iron deficiency anemia, unspecified: Secondary | ICD-10-CM | POA: Diagnosis not present

## 2024-03-21 DIAGNOSIS — N2581 Secondary hyperparathyroidism of renal origin: Secondary | ICD-10-CM | POA: Diagnosis not present

## 2024-03-21 DIAGNOSIS — N186 End stage renal disease: Secondary | ICD-10-CM | POA: Diagnosis not present

## 2024-03-23 DIAGNOSIS — D509 Iron deficiency anemia, unspecified: Secondary | ICD-10-CM | POA: Diagnosis not present

## 2024-03-23 DIAGNOSIS — D631 Anemia in chronic kidney disease: Secondary | ICD-10-CM | POA: Diagnosis not present

## 2024-03-23 DIAGNOSIS — N186 End stage renal disease: Secondary | ICD-10-CM | POA: Diagnosis not present

## 2024-03-23 DIAGNOSIS — Z992 Dependence on renal dialysis: Secondary | ICD-10-CM | POA: Diagnosis not present

## 2024-03-23 DIAGNOSIS — N2581 Secondary hyperparathyroidism of renal origin: Secondary | ICD-10-CM | POA: Diagnosis not present

## 2024-03-23 DIAGNOSIS — N250 Renal osteodystrophy: Secondary | ICD-10-CM | POA: Diagnosis not present

## 2024-03-26 DIAGNOSIS — N2581 Secondary hyperparathyroidism of renal origin: Secondary | ICD-10-CM | POA: Diagnosis not present

## 2024-03-26 DIAGNOSIS — D631 Anemia in chronic kidney disease: Secondary | ICD-10-CM | POA: Diagnosis not present

## 2024-03-26 DIAGNOSIS — N186 End stage renal disease: Secondary | ICD-10-CM | POA: Diagnosis not present

## 2024-03-26 DIAGNOSIS — Z992 Dependence on renal dialysis: Secondary | ICD-10-CM | POA: Diagnosis not present

## 2024-03-26 DIAGNOSIS — D509 Iron deficiency anemia, unspecified: Secondary | ICD-10-CM | POA: Diagnosis not present

## 2024-03-26 DIAGNOSIS — N250 Renal osteodystrophy: Secondary | ICD-10-CM | POA: Diagnosis not present

## 2024-03-27 ENCOUNTER — Encounter (INDEPENDENT_AMBULATORY_CARE_PROVIDER_SITE_OTHER): Payer: Self-pay

## 2024-03-28 DIAGNOSIS — D631 Anemia in chronic kidney disease: Secondary | ICD-10-CM | POA: Diagnosis not present

## 2024-03-28 DIAGNOSIS — D509 Iron deficiency anemia, unspecified: Secondary | ICD-10-CM | POA: Diagnosis not present

## 2024-03-28 DIAGNOSIS — N186 End stage renal disease: Secondary | ICD-10-CM | POA: Diagnosis not present

## 2024-03-28 DIAGNOSIS — N250 Renal osteodystrophy: Secondary | ICD-10-CM | POA: Diagnosis not present

## 2024-03-28 DIAGNOSIS — Z992 Dependence on renal dialysis: Secondary | ICD-10-CM | POA: Diagnosis not present

## 2024-03-28 DIAGNOSIS — N2581 Secondary hyperparathyroidism of renal origin: Secondary | ICD-10-CM | POA: Diagnosis not present

## 2024-03-30 DIAGNOSIS — D631 Anemia in chronic kidney disease: Secondary | ICD-10-CM | POA: Diagnosis not present

## 2024-03-30 DIAGNOSIS — N2581 Secondary hyperparathyroidism of renal origin: Secondary | ICD-10-CM | POA: Diagnosis not present

## 2024-03-30 DIAGNOSIS — Z992 Dependence on renal dialysis: Secondary | ICD-10-CM | POA: Diagnosis not present

## 2024-03-30 DIAGNOSIS — D509 Iron deficiency anemia, unspecified: Secondary | ICD-10-CM | POA: Diagnosis not present

## 2024-03-30 DIAGNOSIS — N250 Renal osteodystrophy: Secondary | ICD-10-CM | POA: Diagnosis not present

## 2024-03-30 DIAGNOSIS — N186 End stage renal disease: Secondary | ICD-10-CM | POA: Diagnosis not present

## 2024-04-02 DIAGNOSIS — N186 End stage renal disease: Secondary | ICD-10-CM | POA: Diagnosis not present

## 2024-04-02 DIAGNOSIS — Z992 Dependence on renal dialysis: Secondary | ICD-10-CM | POA: Diagnosis not present

## 2024-04-02 DIAGNOSIS — N2581 Secondary hyperparathyroidism of renal origin: Secondary | ICD-10-CM | POA: Diagnosis not present

## 2024-04-02 DIAGNOSIS — D509 Iron deficiency anemia, unspecified: Secondary | ICD-10-CM | POA: Diagnosis not present

## 2024-04-02 DIAGNOSIS — N250 Renal osteodystrophy: Secondary | ICD-10-CM | POA: Diagnosis not present

## 2024-04-02 DIAGNOSIS — D631 Anemia in chronic kidney disease: Secondary | ICD-10-CM | POA: Diagnosis not present

## 2024-04-04 DIAGNOSIS — D509 Iron deficiency anemia, unspecified: Secondary | ICD-10-CM | POA: Diagnosis not present

## 2024-04-04 DIAGNOSIS — N2581 Secondary hyperparathyroidism of renal origin: Secondary | ICD-10-CM | POA: Diagnosis not present

## 2024-04-04 DIAGNOSIS — D631 Anemia in chronic kidney disease: Secondary | ICD-10-CM | POA: Diagnosis not present

## 2024-04-04 DIAGNOSIS — N250 Renal osteodystrophy: Secondary | ICD-10-CM | POA: Diagnosis not present

## 2024-04-04 DIAGNOSIS — Z992 Dependence on renal dialysis: Secondary | ICD-10-CM | POA: Diagnosis not present

## 2024-04-04 DIAGNOSIS — N186 End stage renal disease: Secondary | ICD-10-CM | POA: Diagnosis not present

## 2024-04-05 DIAGNOSIS — B351 Tinea unguium: Secondary | ICD-10-CM | POA: Diagnosis not present

## 2024-04-05 DIAGNOSIS — L110 Acquired keratosis follicularis: Secondary | ICD-10-CM | POA: Diagnosis not present

## 2024-04-05 DIAGNOSIS — E1140 Type 2 diabetes mellitus with diabetic neuropathy, unspecified: Secondary | ICD-10-CM | POA: Diagnosis not present

## 2024-04-05 DIAGNOSIS — E1151 Type 2 diabetes mellitus with diabetic peripheral angiopathy without gangrene: Secondary | ICD-10-CM | POA: Diagnosis not present

## 2024-04-06 DIAGNOSIS — N250 Renal osteodystrophy: Secondary | ICD-10-CM | POA: Diagnosis not present

## 2024-04-06 DIAGNOSIS — D631 Anemia in chronic kidney disease: Secondary | ICD-10-CM | POA: Diagnosis not present

## 2024-04-06 DIAGNOSIS — Z992 Dependence on renal dialysis: Secondary | ICD-10-CM | POA: Diagnosis not present

## 2024-04-06 DIAGNOSIS — D509 Iron deficiency anemia, unspecified: Secondary | ICD-10-CM | POA: Diagnosis not present

## 2024-04-06 DIAGNOSIS — N186 End stage renal disease: Secondary | ICD-10-CM | POA: Diagnosis not present

## 2024-04-06 DIAGNOSIS — N2581 Secondary hyperparathyroidism of renal origin: Secondary | ICD-10-CM | POA: Diagnosis not present

## 2024-04-07 DIAGNOSIS — N186 End stage renal disease: Secondary | ICD-10-CM | POA: Diagnosis not present

## 2024-04-07 DIAGNOSIS — Z992 Dependence on renal dialysis: Secondary | ICD-10-CM | POA: Diagnosis not present

## 2024-04-08 DIAGNOSIS — D631 Anemia in chronic kidney disease: Secondary | ICD-10-CM | POA: Diagnosis not present

## 2024-04-08 DIAGNOSIS — N250 Renal osteodystrophy: Secondary | ICD-10-CM | POA: Diagnosis not present

## 2024-04-08 DIAGNOSIS — Z23 Encounter for immunization: Secondary | ICD-10-CM | POA: Diagnosis not present

## 2024-04-08 DIAGNOSIS — N2581 Secondary hyperparathyroidism of renal origin: Secondary | ICD-10-CM | POA: Diagnosis not present

## 2024-04-08 DIAGNOSIS — N186 End stage renal disease: Secondary | ICD-10-CM | POA: Diagnosis not present

## 2024-04-08 DIAGNOSIS — Z992 Dependence on renal dialysis: Secondary | ICD-10-CM | POA: Diagnosis not present

## 2024-04-08 DIAGNOSIS — D509 Iron deficiency anemia, unspecified: Secondary | ICD-10-CM | POA: Diagnosis not present

## 2024-04-09 DIAGNOSIS — Z23 Encounter for immunization: Secondary | ICD-10-CM | POA: Diagnosis not present

## 2024-04-09 DIAGNOSIS — D631 Anemia in chronic kidney disease: Secondary | ICD-10-CM | POA: Diagnosis not present

## 2024-04-09 DIAGNOSIS — Z992 Dependence on renal dialysis: Secondary | ICD-10-CM | POA: Diagnosis not present

## 2024-04-09 DIAGNOSIS — D509 Iron deficiency anemia, unspecified: Secondary | ICD-10-CM | POA: Diagnosis not present

## 2024-04-09 DIAGNOSIS — N186 End stage renal disease: Secondary | ICD-10-CM | POA: Diagnosis not present

## 2024-04-11 DIAGNOSIS — D509 Iron deficiency anemia, unspecified: Secondary | ICD-10-CM | POA: Diagnosis not present

## 2024-04-11 DIAGNOSIS — N186 End stage renal disease: Secondary | ICD-10-CM | POA: Diagnosis not present

## 2024-04-11 DIAGNOSIS — D631 Anemia in chronic kidney disease: Secondary | ICD-10-CM | POA: Diagnosis not present

## 2024-04-11 DIAGNOSIS — Z23 Encounter for immunization: Secondary | ICD-10-CM | POA: Diagnosis not present

## 2024-04-11 DIAGNOSIS — Z992 Dependence on renal dialysis: Secondary | ICD-10-CM | POA: Diagnosis not present

## 2024-04-12 DIAGNOSIS — E8779 Other fluid overload: Secondary | ICD-10-CM | POA: Diagnosis not present

## 2024-04-12 DIAGNOSIS — Z992 Dependence on renal dialysis: Secondary | ICD-10-CM | POA: Diagnosis not present

## 2024-04-12 DIAGNOSIS — N186 End stage renal disease: Secondary | ICD-10-CM | POA: Diagnosis not present

## 2024-04-13 DIAGNOSIS — N186 End stage renal disease: Secondary | ICD-10-CM | POA: Diagnosis not present

## 2024-04-13 DIAGNOSIS — Z23 Encounter for immunization: Secondary | ICD-10-CM | POA: Diagnosis not present

## 2024-04-13 DIAGNOSIS — D631 Anemia in chronic kidney disease: Secondary | ICD-10-CM | POA: Diagnosis not present

## 2024-04-13 DIAGNOSIS — D509 Iron deficiency anemia, unspecified: Secondary | ICD-10-CM | POA: Diagnosis not present

## 2024-04-13 DIAGNOSIS — Z992 Dependence on renal dialysis: Secondary | ICD-10-CM | POA: Diagnosis not present

## 2024-04-16 DIAGNOSIS — Z992 Dependence on renal dialysis: Secondary | ICD-10-CM | POA: Diagnosis not present

## 2024-04-16 DIAGNOSIS — D631 Anemia in chronic kidney disease: Secondary | ICD-10-CM | POA: Diagnosis not present

## 2024-04-16 DIAGNOSIS — Z23 Encounter for immunization: Secondary | ICD-10-CM | POA: Diagnosis not present

## 2024-04-16 DIAGNOSIS — N186 End stage renal disease: Secondary | ICD-10-CM | POA: Diagnosis not present

## 2024-04-16 DIAGNOSIS — D509 Iron deficiency anemia, unspecified: Secondary | ICD-10-CM | POA: Diagnosis not present

## 2024-04-18 DIAGNOSIS — Z23 Encounter for immunization: Secondary | ICD-10-CM | POA: Diagnosis not present

## 2024-04-18 DIAGNOSIS — Z299 Encounter for prophylactic measures, unspecified: Secondary | ICD-10-CM | POA: Diagnosis not present

## 2024-04-18 DIAGNOSIS — D509 Iron deficiency anemia, unspecified: Secondary | ICD-10-CM | POA: Diagnosis not present

## 2024-04-18 DIAGNOSIS — N186 End stage renal disease: Secondary | ICD-10-CM | POA: Diagnosis not present

## 2024-04-18 DIAGNOSIS — Z992 Dependence on renal dialysis: Secondary | ICD-10-CM | POA: Diagnosis not present

## 2024-04-18 DIAGNOSIS — I10 Essential (primary) hypertension: Secondary | ICD-10-CM | POA: Diagnosis not present

## 2024-04-18 DIAGNOSIS — D631 Anemia in chronic kidney disease: Secondary | ICD-10-CM | POA: Diagnosis not present

## 2024-04-18 DIAGNOSIS — J309 Allergic rhinitis, unspecified: Secondary | ICD-10-CM | POA: Diagnosis not present

## 2024-04-18 DIAGNOSIS — E1169 Type 2 diabetes mellitus with other specified complication: Secondary | ICD-10-CM | POA: Diagnosis not present

## 2024-04-20 DIAGNOSIS — D509 Iron deficiency anemia, unspecified: Secondary | ICD-10-CM | POA: Diagnosis not present

## 2024-04-20 DIAGNOSIS — Z23 Encounter for immunization: Secondary | ICD-10-CM | POA: Diagnosis not present

## 2024-04-20 DIAGNOSIS — N186 End stage renal disease: Secondary | ICD-10-CM | POA: Diagnosis not present

## 2024-04-20 DIAGNOSIS — D631 Anemia in chronic kidney disease: Secondary | ICD-10-CM | POA: Diagnosis not present

## 2024-04-20 DIAGNOSIS — Z992 Dependence on renal dialysis: Secondary | ICD-10-CM | POA: Diagnosis not present

## 2024-04-23 DIAGNOSIS — Z992 Dependence on renal dialysis: Secondary | ICD-10-CM | POA: Diagnosis not present

## 2024-04-23 DIAGNOSIS — N186 End stage renal disease: Secondary | ICD-10-CM | POA: Diagnosis not present

## 2024-04-23 DIAGNOSIS — D631 Anemia in chronic kidney disease: Secondary | ICD-10-CM | POA: Diagnosis not present

## 2024-04-23 DIAGNOSIS — Z23 Encounter for immunization: Secondary | ICD-10-CM | POA: Diagnosis not present

## 2024-04-23 DIAGNOSIS — D509 Iron deficiency anemia, unspecified: Secondary | ICD-10-CM | POA: Diagnosis not present

## 2024-04-25 DIAGNOSIS — N186 End stage renal disease: Secondary | ICD-10-CM | POA: Diagnosis not present

## 2024-04-25 DIAGNOSIS — Z992 Dependence on renal dialysis: Secondary | ICD-10-CM | POA: Diagnosis not present

## 2024-04-25 DIAGNOSIS — D509 Iron deficiency anemia, unspecified: Secondary | ICD-10-CM | POA: Diagnosis not present

## 2024-04-25 DIAGNOSIS — Z23 Encounter for immunization: Secondary | ICD-10-CM | POA: Diagnosis not present

## 2024-04-25 DIAGNOSIS — D631 Anemia in chronic kidney disease: Secondary | ICD-10-CM | POA: Diagnosis not present

## 2024-04-27 ENCOUNTER — Telehealth: Payer: Self-pay | Admitting: Cardiology

## 2024-04-27 DIAGNOSIS — R0602 Shortness of breath: Secondary | ICD-10-CM

## 2024-04-27 DIAGNOSIS — Z23 Encounter for immunization: Secondary | ICD-10-CM | POA: Diagnosis not present

## 2024-04-27 DIAGNOSIS — Z992 Dependence on renal dialysis: Secondary | ICD-10-CM | POA: Diagnosis not present

## 2024-04-27 DIAGNOSIS — N186 End stage renal disease: Secondary | ICD-10-CM | POA: Diagnosis not present

## 2024-04-27 DIAGNOSIS — D509 Iron deficiency anemia, unspecified: Secondary | ICD-10-CM | POA: Diagnosis not present

## 2024-04-27 DIAGNOSIS — D631 Anemia in chronic kidney disease: Secondary | ICD-10-CM | POA: Diagnosis not present

## 2024-04-27 NOTE — Telephone Encounter (Signed)
 Can we order an echo for SOB and get him a PA appt please  Letta Raw MD

## 2024-04-27 NOTE — Telephone Encounter (Signed)
 Spoke with wife and informed that pt needs an appt and echo at next available appt. Wife agreed with plan of care.

## 2024-04-27 NOTE — Telephone Encounter (Signed)
 Spoke with wife who states that pt has been experiencing  SOB for the last 3 weeks. Pt is fine when sitting but when moving he gets SOB. Denies chest pain and syncope. She denies weight gain, but c/o swelling to feet and legs. Pt gets dizzy when he is up walking. Wife denies weight gain and states that fluid is managed by dialysis. He goes to dialysis on Monday, Wednesday, Friday. Please advise.

## 2024-04-27 NOTE — Telephone Encounter (Signed)
 Pt c/o Shortness Of Breath: STAT if SOB developed within the last 24 hours or pt is noticeably SOB on the phone  1. Are you currently SOB (can you hear that pt is SOB on the phone)? no  2. How long have you been experiencing SOB? 3 weeks  3. Are you SOB when sitting or when up moving around? Moving around  4. Are you currently experiencing any other symptoms? Dizzy, balance is off,fatigue

## 2024-04-30 DIAGNOSIS — E119 Type 2 diabetes mellitus without complications: Secondary | ICD-10-CM | POA: Diagnosis not present

## 2024-04-30 DIAGNOSIS — Z992 Dependence on renal dialysis: Secondary | ICD-10-CM | POA: Diagnosis not present

## 2024-04-30 DIAGNOSIS — N186 End stage renal disease: Secondary | ICD-10-CM | POA: Diagnosis not present

## 2024-04-30 DIAGNOSIS — D509 Iron deficiency anemia, unspecified: Secondary | ICD-10-CM | POA: Diagnosis not present

## 2024-04-30 DIAGNOSIS — Z23 Encounter for immunization: Secondary | ICD-10-CM | POA: Diagnosis not present

## 2024-04-30 DIAGNOSIS — D631 Anemia in chronic kidney disease: Secondary | ICD-10-CM | POA: Diagnosis not present

## 2024-04-30 DIAGNOSIS — Z794 Long term (current) use of insulin: Secondary | ICD-10-CM | POA: Diagnosis not present

## 2024-05-02 DIAGNOSIS — D631 Anemia in chronic kidney disease: Secondary | ICD-10-CM | POA: Diagnosis not present

## 2024-05-02 DIAGNOSIS — Z23 Encounter for immunization: Secondary | ICD-10-CM | POA: Diagnosis not present

## 2024-05-02 DIAGNOSIS — N186 End stage renal disease: Secondary | ICD-10-CM | POA: Diagnosis not present

## 2024-05-02 DIAGNOSIS — D509 Iron deficiency anemia, unspecified: Secondary | ICD-10-CM | POA: Diagnosis not present

## 2024-05-02 DIAGNOSIS — Z992 Dependence on renal dialysis: Secondary | ICD-10-CM | POA: Diagnosis not present

## 2024-05-03 DIAGNOSIS — Z992 Dependence on renal dialysis: Secondary | ICD-10-CM | POA: Diagnosis not present

## 2024-05-03 DIAGNOSIS — E8779 Other fluid overload: Secondary | ICD-10-CM | POA: Diagnosis not present

## 2024-05-03 DIAGNOSIS — N186 End stage renal disease: Secondary | ICD-10-CM | POA: Diagnosis not present

## 2024-05-04 DIAGNOSIS — Z992 Dependence on renal dialysis: Secondary | ICD-10-CM | POA: Diagnosis not present

## 2024-05-04 DIAGNOSIS — D509 Iron deficiency anemia, unspecified: Secondary | ICD-10-CM | POA: Diagnosis not present

## 2024-05-04 DIAGNOSIS — D631 Anemia in chronic kidney disease: Secondary | ICD-10-CM | POA: Diagnosis not present

## 2024-05-04 DIAGNOSIS — N186 End stage renal disease: Secondary | ICD-10-CM | POA: Diagnosis not present

## 2024-05-04 DIAGNOSIS — Z23 Encounter for immunization: Secondary | ICD-10-CM | POA: Diagnosis not present

## 2024-05-07 DIAGNOSIS — N186 End stage renal disease: Secondary | ICD-10-CM | POA: Diagnosis not present

## 2024-05-07 DIAGNOSIS — D509 Iron deficiency anemia, unspecified: Secondary | ICD-10-CM | POA: Diagnosis not present

## 2024-05-07 DIAGNOSIS — Z23 Encounter for immunization: Secondary | ICD-10-CM | POA: Diagnosis not present

## 2024-05-07 DIAGNOSIS — D631 Anemia in chronic kidney disease: Secondary | ICD-10-CM | POA: Diagnosis not present

## 2024-05-07 DIAGNOSIS — Z992 Dependence on renal dialysis: Secondary | ICD-10-CM | POA: Diagnosis not present

## 2024-05-08 DIAGNOSIS — N2581 Secondary hyperparathyroidism of renal origin: Secondary | ICD-10-CM | POA: Diagnosis not present

## 2024-05-08 DIAGNOSIS — N250 Renal osteodystrophy: Secondary | ICD-10-CM | POA: Diagnosis not present

## 2024-05-08 DIAGNOSIS — D631 Anemia in chronic kidney disease: Secondary | ICD-10-CM | POA: Diagnosis not present

## 2024-05-08 DIAGNOSIS — Z992 Dependence on renal dialysis: Secondary | ICD-10-CM | POA: Diagnosis not present

## 2024-05-08 DIAGNOSIS — D509 Iron deficiency anemia, unspecified: Secondary | ICD-10-CM | POA: Diagnosis not present

## 2024-05-08 DIAGNOSIS — N186 End stage renal disease: Secondary | ICD-10-CM | POA: Diagnosis not present

## 2024-05-09 DIAGNOSIS — D631 Anemia in chronic kidney disease: Secondary | ICD-10-CM | POA: Diagnosis not present

## 2024-05-09 DIAGNOSIS — N186 End stage renal disease: Secondary | ICD-10-CM | POA: Diagnosis not present

## 2024-05-09 DIAGNOSIS — N2581 Secondary hyperparathyroidism of renal origin: Secondary | ICD-10-CM | POA: Diagnosis not present

## 2024-05-09 DIAGNOSIS — N250 Renal osteodystrophy: Secondary | ICD-10-CM | POA: Diagnosis not present

## 2024-05-09 DIAGNOSIS — D509 Iron deficiency anemia, unspecified: Secondary | ICD-10-CM | POA: Diagnosis not present

## 2024-05-09 DIAGNOSIS — Z992 Dependence on renal dialysis: Secondary | ICD-10-CM | POA: Diagnosis not present

## 2024-05-11 DIAGNOSIS — Z992 Dependence on renal dialysis: Secondary | ICD-10-CM | POA: Diagnosis not present

## 2024-05-11 DIAGNOSIS — N2581 Secondary hyperparathyroidism of renal origin: Secondary | ICD-10-CM | POA: Diagnosis not present

## 2024-05-11 DIAGNOSIS — D509 Iron deficiency anemia, unspecified: Secondary | ICD-10-CM | POA: Diagnosis not present

## 2024-05-11 DIAGNOSIS — N250 Renal osteodystrophy: Secondary | ICD-10-CM | POA: Diagnosis not present

## 2024-05-11 DIAGNOSIS — D631 Anemia in chronic kidney disease: Secondary | ICD-10-CM | POA: Diagnosis not present

## 2024-05-11 DIAGNOSIS — N186 End stage renal disease: Secondary | ICD-10-CM | POA: Diagnosis not present

## 2024-05-14 DIAGNOSIS — Z992 Dependence on renal dialysis: Secondary | ICD-10-CM | POA: Diagnosis not present

## 2024-05-14 DIAGNOSIS — N186 End stage renal disease: Secondary | ICD-10-CM | POA: Diagnosis not present

## 2024-05-14 DIAGNOSIS — D509 Iron deficiency anemia, unspecified: Secondary | ICD-10-CM | POA: Diagnosis not present

## 2024-05-14 DIAGNOSIS — N250 Renal osteodystrophy: Secondary | ICD-10-CM | POA: Diagnosis not present

## 2024-05-14 DIAGNOSIS — D631 Anemia in chronic kidney disease: Secondary | ICD-10-CM | POA: Diagnosis not present

## 2024-05-14 DIAGNOSIS — N2581 Secondary hyperparathyroidism of renal origin: Secondary | ICD-10-CM | POA: Diagnosis not present

## 2024-05-16 DIAGNOSIS — D631 Anemia in chronic kidney disease: Secondary | ICD-10-CM | POA: Diagnosis not present

## 2024-05-16 DIAGNOSIS — N250 Renal osteodystrophy: Secondary | ICD-10-CM | POA: Diagnosis not present

## 2024-05-16 DIAGNOSIS — N186 End stage renal disease: Secondary | ICD-10-CM | POA: Diagnosis not present

## 2024-05-16 DIAGNOSIS — D509 Iron deficiency anemia, unspecified: Secondary | ICD-10-CM | POA: Diagnosis not present

## 2024-05-16 DIAGNOSIS — Z992 Dependence on renal dialysis: Secondary | ICD-10-CM | POA: Diagnosis not present

## 2024-05-16 DIAGNOSIS — N2581 Secondary hyperparathyroidism of renal origin: Secondary | ICD-10-CM | POA: Diagnosis not present

## 2024-05-18 DIAGNOSIS — D509 Iron deficiency anemia, unspecified: Secondary | ICD-10-CM | POA: Diagnosis not present

## 2024-05-18 DIAGNOSIS — N2581 Secondary hyperparathyroidism of renal origin: Secondary | ICD-10-CM | POA: Diagnosis not present

## 2024-05-18 DIAGNOSIS — N250 Renal osteodystrophy: Secondary | ICD-10-CM | POA: Diagnosis not present

## 2024-05-18 DIAGNOSIS — N186 End stage renal disease: Secondary | ICD-10-CM | POA: Diagnosis not present

## 2024-05-18 DIAGNOSIS — Z992 Dependence on renal dialysis: Secondary | ICD-10-CM | POA: Diagnosis not present

## 2024-05-18 DIAGNOSIS — D631 Anemia in chronic kidney disease: Secondary | ICD-10-CM | POA: Diagnosis not present

## 2024-05-21 DIAGNOSIS — N250 Renal osteodystrophy: Secondary | ICD-10-CM | POA: Diagnosis not present

## 2024-05-21 DIAGNOSIS — D509 Iron deficiency anemia, unspecified: Secondary | ICD-10-CM | POA: Diagnosis not present

## 2024-05-21 DIAGNOSIS — Z992 Dependence on renal dialysis: Secondary | ICD-10-CM | POA: Diagnosis not present

## 2024-05-21 DIAGNOSIS — N2581 Secondary hyperparathyroidism of renal origin: Secondary | ICD-10-CM | POA: Diagnosis not present

## 2024-05-21 DIAGNOSIS — N186 End stage renal disease: Secondary | ICD-10-CM | POA: Diagnosis not present

## 2024-05-21 DIAGNOSIS — D631 Anemia in chronic kidney disease: Secondary | ICD-10-CM | POA: Diagnosis not present

## 2024-05-23 ENCOUNTER — Other Ambulatory Visit (HOSPITAL_COMMUNITY)

## 2024-05-23 ENCOUNTER — Ambulatory Visit: Attending: Cardiology

## 2024-05-23 ENCOUNTER — Telehealth: Payer: Self-pay | Admitting: Cardiology

## 2024-05-23 DIAGNOSIS — N250 Renal osteodystrophy: Secondary | ICD-10-CM | POA: Diagnosis not present

## 2024-05-23 DIAGNOSIS — N2581 Secondary hyperparathyroidism of renal origin: Secondary | ICD-10-CM | POA: Diagnosis not present

## 2024-05-23 DIAGNOSIS — D509 Iron deficiency anemia, unspecified: Secondary | ICD-10-CM | POA: Diagnosis not present

## 2024-05-23 DIAGNOSIS — Z992 Dependence on renal dialysis: Secondary | ICD-10-CM | POA: Diagnosis not present

## 2024-05-23 DIAGNOSIS — R0602 Shortness of breath: Secondary | ICD-10-CM | POA: Diagnosis not present

## 2024-05-23 DIAGNOSIS — D631 Anemia in chronic kidney disease: Secondary | ICD-10-CM | POA: Diagnosis not present

## 2024-05-23 DIAGNOSIS — N186 End stage renal disease: Secondary | ICD-10-CM | POA: Diagnosis not present

## 2024-05-23 LAB — ECHOCARDIOGRAM COMPLETE
AR max vel: 3.14 cm2
AV Area VTI: 3.38 cm2
AV Area mean vel: 2.85 cm2
AV Mean grad: 7 mmHg
AV Peak grad: 10.8 mmHg
AV Vena cont: 0.8 cm
Ao pk vel: 1.64 m/s
Calc EF: 37.7 %
MV VTI: 4.01 cm2
P 1/2 time: 510 ms
S' Lateral: 5.5 cm
Single Plane A2C EF: 32.1 %
Single Plane A4C EF: 39.5 %

## 2024-05-23 NOTE — Telephone Encounter (Signed)
 For the last couple weeks pt can not walk more than a couple steps without struggling to breathe. Wife says its very scary when it happens. Pt also says it bothers him at night when he turns over at night.  Best number [phone] if no one answers please call [phone]

## 2024-05-25 ENCOUNTER — Ambulatory Visit

## 2024-05-25 ENCOUNTER — Encounter: Payer: Self-pay | Admitting: Emergency Medicine

## 2024-05-25 ENCOUNTER — Ambulatory Visit: Admitting: Emergency Medicine

## 2024-05-25 VITALS — BP 104/67 | HR 86 | Temp 98.9°F | Ht 74.0 in | Wt 245.0 lb

## 2024-05-25 DIAGNOSIS — J449 Chronic obstructive pulmonary disease, unspecified: Secondary | ICD-10-CM

## 2024-05-25 DIAGNOSIS — Z87891 Personal history of nicotine dependence: Secondary | ICD-10-CM | POA: Diagnosis not present

## 2024-05-25 DIAGNOSIS — R0600 Dyspnea, unspecified: Secondary | ICD-10-CM | POA: Diagnosis not present

## 2024-05-25 DIAGNOSIS — R0602 Shortness of breath: Secondary | ICD-10-CM

## 2024-05-25 DIAGNOSIS — Z992 Dependence on renal dialysis: Secondary | ICD-10-CM

## 2024-05-25 DIAGNOSIS — J984 Other disorders of lung: Secondary | ICD-10-CM | POA: Diagnosis not present

## 2024-05-25 DIAGNOSIS — N2581 Secondary hyperparathyroidism of renal origin: Secondary | ICD-10-CM | POA: Diagnosis not present

## 2024-05-25 DIAGNOSIS — R0609 Other forms of dyspnea: Secondary | ICD-10-CM

## 2024-05-25 DIAGNOSIS — N186 End stage renal disease: Secondary | ICD-10-CM

## 2024-05-25 DIAGNOSIS — D509 Iron deficiency anemia, unspecified: Secondary | ICD-10-CM | POA: Diagnosis not present

## 2024-05-25 DIAGNOSIS — D631 Anemia in chronic kidney disease: Secondary | ICD-10-CM | POA: Diagnosis not present

## 2024-05-25 DIAGNOSIS — I429 Cardiomyopathy, unspecified: Secondary | ICD-10-CM

## 2024-05-25 DIAGNOSIS — N250 Renal osteodystrophy: Secondary | ICD-10-CM | POA: Diagnosis not present

## 2024-05-25 DIAGNOSIS — R918 Other nonspecific abnormal finding of lung field: Secondary | ICD-10-CM | POA: Diagnosis not present

## 2024-05-25 NOTE — Telephone Encounter (Signed)
 Per wife, patient's difficulty breathing seems to have gotten worse over the past couple of weeks. Advised if he has worsening SOB he needed to be evaluated in the ED. Says he saw pulmonology today and was advised that his symptoms could be a combination of issues with his kidneys and fluid overload. Advised that message would be sent to provider for echo result and recommendations. ER precautions discussed. Verbalized understanding.

## 2024-05-25 NOTE — Assessment & Plan Note (Signed)
 COPD Managed with Breo and albuterol  inhaler. No improvement with inhalers, suggesting COPD may not be primary cause of dyspnea. Smoking history of half a pack per day for 30 years. - Order formal pulmonary function tests to assess COPD severity. - Continue Breo, consider changing inhaler based on test results.

## 2024-05-25 NOTE — Assessment & Plan Note (Signed)
 Dyspnea Dyspnea on exertion, worsening over six weeks. Possible contributors include fluid overload, non-ischemic cardiomyopathy, and potential COPD. Reports nocturnal breathing attacks and leg swelling, indicating fluid retention. - Order chest x-ray to evaluate for fluid overload. - Check oxygen levels during exertion. - Arrange formal pulmonary function tests.

## 2024-05-25 NOTE — Assessment & Plan Note (Signed)
 End stage renal disease on dialysis Increased fluid retention potentially contributing to dyspnea.  His target dry weight has been changed over last 6 weeks which does correlate with some increased shortness of breath.  History of fluid overload episodes correlating with shortness of breath. - Optimize fluid status to balance cramping and dyspnea. - Coordinate with dialysis team to assess and adjust dry weight.

## 2024-05-25 NOTE — Patient Instructions (Addendum)
 We will arrange for pulmonary function testing Chest x-ray today Stay on Breo for now.  Rinse and gargle after using.  Depending on your pulmonary function testing we may decide to change this to an alternative Keep your albuterol  available to use 2 puffs when needed for shortness of breath, chest tightness, wheezing. Follow-up with cardiology as planned We may need to review your dry weight with nephrology as it seems that some of your symptoms have corresponded with fluid retention and increased dry weight over the last 6 weeks.  We we will have to balance this with your cramping and other side effects. Follow with Dr. Shelah in about 2 months so we can review your pulmonary function testing

## 2024-05-25 NOTE — Assessment & Plan Note (Signed)
 Non-ischemic cardiomyopathy Recent echocardiogram shows left ventricular function at 35-40%, decreased from 50-55%. Right heart pressures elevated. Unclear if decline is primary or secondary to fluid overload. - Coordinate with cardiologist Dr. Alvan for further evaluation and management. - Review recent echocardiogram findings with cardiology.

## 2024-05-25 NOTE — Progress Notes (Signed)
 Subjective:    Patient ID: Todd Robertson, male    DOB: 01/09/1946, 78 y.o.   MRN: [id number]  HPI Todd Robertson is a 78 year old gentleman, former smoker (15 pk-yrs), with COPD and end stage renal disease who presents with dyspnea. He was referred for evaluation of dyspnea.  He has been experiencing shortness of breath for approximately six weeks, described as his breath 'cutting off' during standing or exertion, significantly limiting his mobility. He primarily remains seated due to weakness and uses a cane for walking.  There is a history of fluid retention issues, previously linked to episodes of shortness of breath. In 2002, similar symptoms resolved after fluid removal in the emergency room. Recently, his dialysis regimen was adjusted to allow more fluid retention due to cramping concerns, coinciding with the onset of current symptoms. His dry weight has increased by approximately 3 kilograms over the past six months.  He has non-ischemic cardiomyopathy and recently underwent an echocardiogram; the patient recalls previous measurements of left ventricular function being between 50-55%.  He has COPD and is managed on Breo and albuterol  inhalers, using albuterol  four to five times a day without relief. He experiences coughing with brown mucus in the morning, which clears after brushing his teeth. No improvement with Breo, which he has been using for at least two years.  He experiences nocturnal breathing attacks when turning in bed, particularly when moving from his back to his left side, resolving after a couple of minutes of rest. He also reports swelling in his legs and feet, which has improved slightly but remains present.  He has a significant smoking history, having smoked half a pack per day for 30 years before quitting. He has not had formal breathing tests recently, though he recalls having them in the past.   Echocardiogram: Left ventricular ejection fraction 35-40%, right heart  normal size with elevated pressures (05/23/2024)  Review of Systems As per HPI  Past Medical History:  Diagnosis Date   Arthritis    Cancer (HCC)    prostate   Cardiomyopathy    secondary   CKD (chronic kidney disease)    DM2 (diabetes mellitus, type 2) (HCC)    Gout    HTN (hypertension)    unspec   Hypercholesterolemia    Nonischemic cardiomyopathy (HCC)    a. EF 40-50% by most recent 2-D echo b. EF 25% by cardiac cath in 2004   Overweight(278.02)      Family History  Problem Relation Age of Onset   Hypertension Mother    Kidney disease Mother    Heart disease Father    Kidney disease Other      Social History   Socioeconomic History   Marital status: Married    Spouse name: Not on file   Number of children: 2   Years of education: Not on file   Highest education level: Not on file  Occupational History   Occupation: retired  Tobacco Use   Smoking status: Former    Current packs/day: 0.00    Average packs/day: 0.5 packs/day for 20.0 years (10.0 ttl pk-yrs)    Types: Cigarettes    Start date: 02/12/1966    Quit date: 11/08/1984    Years since quitting: 39.5   Smokeless tobacco: Never  Vaping Use   Vaping status: Never Used  Substance and Sexual Activity   Alcohol  use: No    Alcohol /week: 0.0 standard drinks of alcohol    Drug use: No   Sexual activity: Yes  Birth control/protection: None  Other Topics Concern   Not on file  Social History Narrative   Full time.    Social Drivers of Corporate investment banker Strain: Not on file  Food Insecurity: No Food Insecurity (01/03/2024)   Hunger Vital Sign    Worried About Running Out of Food in the Last Year: Never true    Ran Out of Food in the Last Year: Never true  Transportation Needs: No Transportation Needs (01/03/2024)   PRAPARE - Administrator, Civil Service (Medical): No    Lack of Transportation (Non-Medical): No  Physical Activity: Not on file  Stress: No Stress Concern Present  (01/27/2021)   Received from [facility] - Occupational Stress Questionnaire    Feeling of Stress : Only a little  Social Connections: Socially Integrated (01/03/2024)   Social Connection and Isolation Panel    Frequency of Communication with Friends and Family: More than three times a week    Frequency of Social Gatherings with Friends and Family: More than three times a week    Attends Religious Services: More than 4 times per year    Active Member of [employer] or Organizations: Yes    Attends Banker Meetings: More than 4 times per year    Marital Status: Married  Catering manager Violence: Not At Risk (01/03/2024)   Humiliation, Afraid, Rape, and Kick questionnaire    Fear of Current or Ex-Partner: No    Emotionally Abused: No    Physically Abused: No    Sexually Abused: No    - Tobacco: Former smoker, 0.5 ppd x 30 years (15 pack-years) - Employment: Research scientist (medical) - Exposures: Chemical exposures in the lab  No Known Allergies   Outpatient Medications Prior to Visit  Medication Sig Dispense Refill   acetaminophen  (TYLENOL ) 500 MG tablet Take 500-1,000 mg by mouth every 6 (six) hours as needed for moderate pain.     albuterol  (VENTOLIN  HFA) 108 (90 Base) MCG/ACT inhaler Inhale 1-2 puffs into the lungs every 6 (six) hours as needed for wheezing or shortness of breath. 6.7 g 1   allopurinol  (ZYLOPRIM ) 300 MG tablet Take 300 mg by mouth daily.     atorvastatin  (LIPITOR) 40 MG tablet Take 1 tablet (40 mg total) by mouth daily. 90 tablet 3   b complex-vitamin c-folic acid  (NEPHRO-VITE) 0.8 MG TABS tablet Take 1 tablet by mouth at bedtime.     BREO ELLIPTA  100-25 MCG/ACT AEPB Inhale 1 puff into the lungs daily.     calcium  acetate (PHOSLO ) 667 MG capsule Take 3 capsules (2,001 mg total) by mouth 3 (three) times daily with meals. 180 capsule 1   cetirizine (ZYRTEC) 10 MG tablet Take 10 mg by mouth daily.     Cholecalciferol (VITAMIN D )  50 MCG (2000 UT) tablet Take 1 tablet (2,000 Units total) by mouth daily. 30 tablet 0   Colchicine  0.6 MG CAPS Take 1 capsule (0.6 mg total) by mouth 2 (two) times daily as needed. Home med.     hydrOXYzine (ATARAX) 25 MG tablet Take 25 mg by mouth every 6 (six) hours as needed.     methocarbamol  (ROBAXIN ) 500 MG tablet Take 500 mg by mouth 3 (three) times a week. Take Sun, Tue, and Thurs (day before dialysis)     midodrine  (PROAMATINE ) 10 MG tablet TAKE 1 TABLET BY MOUTH THREE TIMES DAILY WITH MEALS 270 tablet 3   Multiple Vitamins-Minerals (CENTRUM SILVER  50+MEN) TABS Take 1 tablet by mouth daily.     Naphazoline HCl (CLEAR EYES OP) Place 1 drop into both eyes daily.     Polyethylene Glycol 3350  (MIRALAX  PO) Take 17 g by mouth daily as needed (constipation).     pramipexole (MIRAPEX) 0.5 MG tablet Take 0.5 mg by mouth at bedtime.     senna (SENOKOT) 8.6 MG tablet Take 1-2 tablets by mouth daily as needed for constipation.     tamsulosin  (FLOMAX ) 0.4 MG CAPS capsule Take 1 capsule (0.4 mg total) by mouth daily. 90 capsule 3   trolamine salicylate (ASPERCREME) 10 % cream Apply 1 Application topically as needed for muscle pain.     No facility-administered medications prior to visit.        Objective:   Physical Exam Vitals:   05/25/24 1017  BP: 104/67  Pulse: 86  Temp: 98.9 F (37.2 C)  TempSrc: Temporal  SpO2: 98%  Weight: 245 lb (111.1 kg)  Height: 6' 2 (1.88 m)   Gen: Pleasant, elderly gentleman in a wheelchair, in no distress,  normal affect  ENT: No lesions,  mouth clear,  oropharynx clear, no postnasal drip  Neck: No JVD, no stridor  Lungs: No use of accessory muscles, somewhat distant, some decreased breath sounds in both bases without crackles or wheezes  Cardiovascular: RRR, heart sounds normal, no murmur or gallops, 2+ pretibial and pedal edema  Musculoskeletal: No deformities, no cyanosis or clubbing  Neuro: alert, awake, non focal  Skin: Warm, no lesions or  rash       Assessment & Plan:  Dyspnea on exertion Dyspnea Dyspnea on exertion, worsening over six weeks. Possible contributors include fluid overload, non-ischemic cardiomyopathy, and potential COPD. Reports nocturnal breathing attacks and leg swelling, indicating fluid retention. - Order chest x-ray to evaluate for fluid overload. - Check oxygen levels during exertion. - Arrange formal pulmonary function tests.  ESRD on dialysis (HCC) End stage renal disease on dialysis Increased fluid retention potentially contributing to dyspnea.  His target dry weight has been changed over last 6 weeks which does correlate with some increased shortness of breath.  History of fluid overload episodes correlating with shortness of breath. - Optimize fluid status to balance cramping and dyspnea. - Coordinate with dialysis team to assess and adjust dry weight.  Secondary cardiomyopathy (HCC) Non-ischemic cardiomyopathy Recent echocardiogram shows left ventricular function at 35-40%, decreased from 50-55%. Right heart pressures elevated. Unclear if decline is primary or secondary to fluid overload. - Coordinate with cardiologist Dr. Alvan for further evaluation and management. - Review recent echocardiogram findings with cardiology.   COPD (chronic obstructive pulmonary disease) (HCC) COPD Managed with Breo and albuterol  inhaler. No improvement with inhalers, suggesting COPD may not be primary cause of dyspnea. Smoking history of half a pack per day for 30 years. - Order formal pulmonary function tests to assess COPD severity. - Continue Breo, consider changing inhaler based on test results.  Lamar Chris, MD, PhD 05/25/2024, 12:23 PM [facility] [phone] or if no answer before 7:00PM call [phone] For any issues after 7:00PM please call eLink [phone]

## 2024-05-25 NOTE — Telephone Encounter (Signed)
 Wife Alveta) returned RN's call.

## 2024-05-28 ENCOUNTER — Ambulatory Visit: Payer: Self-pay | Admitting: Cardiology

## 2024-05-28 DIAGNOSIS — Z992 Dependence on renal dialysis: Secondary | ICD-10-CM | POA: Diagnosis not present

## 2024-05-28 DIAGNOSIS — N186 End stage renal disease: Secondary | ICD-10-CM | POA: Diagnosis not present

## 2024-05-28 DIAGNOSIS — D509 Iron deficiency anemia, unspecified: Secondary | ICD-10-CM | POA: Diagnosis not present

## 2024-05-28 DIAGNOSIS — N250 Renal osteodystrophy: Secondary | ICD-10-CM | POA: Diagnosis not present

## 2024-05-28 DIAGNOSIS — N2581 Secondary hyperparathyroidism of renal origin: Secondary | ICD-10-CM | POA: Diagnosis not present

## 2024-05-28 DIAGNOSIS — D631 Anemia in chronic kidney disease: Secondary | ICD-10-CM | POA: Diagnosis not present

## 2024-05-30 ENCOUNTER — Other Ambulatory Visit (HOSPITAL_COMMUNITY)
Admission: RE | Admit: 2024-05-30 | Discharge: 2024-05-30 | Disposition: A | Discharge status: Home / Self Care | Source: Ambulatory Visit | Attending: Student | Admitting: Student

## 2024-05-30 ENCOUNTER — Ambulatory Visit: Admitting: Student

## 2024-05-30 ENCOUNTER — Ambulatory Visit: Payer: Self-pay | Admitting: Student

## 2024-05-30 ENCOUNTER — Encounter: Payer: Self-pay | Admitting: Student

## 2024-05-30 ENCOUNTER — Other Ambulatory Visit: Payer: Self-pay

## 2024-05-30 VITALS — BP 104/60 | HR 70 | Ht 74.0 in | Wt 245.8 lb

## 2024-05-30 DIAGNOSIS — D631 Anemia in chronic kidney disease: Secondary | ICD-10-CM | POA: Diagnosis not present

## 2024-05-30 DIAGNOSIS — I493 Ventricular premature depolarization: Secondary | ICD-10-CM | POA: Diagnosis not present

## 2024-05-30 DIAGNOSIS — N186 End stage renal disease: Secondary | ICD-10-CM

## 2024-05-30 DIAGNOSIS — I959 Hypotension, unspecified: Secondary | ICD-10-CM

## 2024-05-30 DIAGNOSIS — I340 Nonrheumatic mitral (valve) insufficiency: Secondary | ICD-10-CM | POA: Diagnosis not present

## 2024-05-30 DIAGNOSIS — E782 Mixed hyperlipidemia: Secondary | ICD-10-CM | POA: Diagnosis not present

## 2024-05-30 DIAGNOSIS — D509 Iron deficiency anemia, unspecified: Secondary | ICD-10-CM | POA: Diagnosis not present

## 2024-05-30 DIAGNOSIS — I5020 Unspecified systolic (congestive) heart failure: Secondary | ICD-10-CM

## 2024-05-30 DIAGNOSIS — N250 Renal osteodystrophy: Secondary | ICD-10-CM | POA: Diagnosis not present

## 2024-05-30 DIAGNOSIS — Z01818 Encounter for other preprocedural examination: Secondary | ICD-10-CM | POA: Diagnosis not present

## 2024-05-30 DIAGNOSIS — Z79899 Other long term (current) drug therapy: Secondary | ICD-10-CM

## 2024-05-30 DIAGNOSIS — Z8546 Personal history of malignant neoplasm of prostate: Secondary | ICD-10-CM

## 2024-05-30 DIAGNOSIS — N2581 Secondary hyperparathyroidism of renal origin: Secondary | ICD-10-CM | POA: Diagnosis not present

## 2024-05-30 DIAGNOSIS — Z992 Dependence on renal dialysis: Secondary | ICD-10-CM | POA: Diagnosis not present

## 2024-05-30 LAB — CBC
HCT: 35.2 % — ABNORMAL LOW (ref 39.0–52.0)
Hemoglobin: 11.2 g/dL — ABNORMAL LOW (ref 13.0–17.0)
MCH: 32.1 pg (ref 26.0–34.0)
MCHC: 31.8 g/dL (ref 30.0–36.0)
MCV: 100.9 fL — ABNORMAL HIGH (ref 80.0–100.0)
Platelets: 192 K/uL (ref 150–400)
RBC: 3.49 MIL/uL — ABNORMAL LOW (ref 4.22–5.81)
RDW: 15.9 % — ABNORMAL HIGH (ref 11.5–15.5)
WBC: 7 K/uL (ref 4.0–10.5)
nRBC: 0 % (ref 0.0–0.2)

## 2024-05-30 LAB — BASIC METABOLIC PANEL WITH GFR
Anion gap: 15 (ref 5–15)
BUN: 27 mg/dL — ABNORMAL HIGH (ref 8–23)
CO2: 29 mmol/L (ref 22–32)
Calcium: 9.4 mg/dL (ref 8.9–10.3)
Chloride: 95 mmol/L — ABNORMAL LOW (ref 98–111)
Creatinine, Ser: 7.21 mg/dL — ABNORMAL HIGH (ref 0.61–1.24)
GFR, Estimated: 7 mL/min — ABNORMAL LOW (ref >60)
Glucose, Bld: 89 mg/dL (ref 70–99)
Potassium: 3.9 mmol/L (ref 3.5–5.1)
Sodium: 139 mmol/L (ref 135–145)

## 2024-05-30 NOTE — H&P (View-Only) (Signed)
 Cardiology Office Note    Date:  05/30/2024  ID:  SIDDARTH HSIUNG, DOB 1946-08-13, MRN [id number] Cardiologist: Alvan Carrier, MD Cardiology APP:  Johnson Laymon HERO, PA-C { :  History of Present Illness:    ABRAHIM SARGENT is a 78 y.o. 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  He was examined by myself in 09/2023 and reported having issues with hypotension during dialysis and was taking Midodrine  on a daily basis and holding Lopressor  the days of dialysis. GDMT for his cardiomyopathy had been limited and he was continued on Lopressor  12.5 mg twice daily on nondialysis days. Was not on an ACE-I/ARB/ARNI/MRA/SGLT2 inhibitor and BP had not allowed for hydralazine/nitrates. 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 Also noted to have hypotension during that timeframe and Lopressor  was discontinued.  The patient's wife contacted the office in 04/2024 reporting he was having worsening shortness of breath over the past [redacted] weeks along with swelling and dizziness. Dr. Alvan did recommend a follow-up echocardiogram. This was obtained on 05/23/2024 and showed his EF was reduced at 35 to 40% and he did have wall motion abnormalities with the posterior wall, apical septal segment, basal inferior segment and apex being akinetic.  Was also noted to have mildly reduced RV function, mildly elevated PASP at 45 mmHg, moderate MR and mild AI.   Did have mild dilatation of the ascending aorta at 41 mm. He also had follow-up with Pulmonology on 05/25/2024 and dyspnea was felt to be possibly due to fluid overload, NICM or COPD. PFT's and CXR were ordered for further assessment.  In talking with the patient, his wife and daughter (his other daughter joined by phone as well), he reports having worsening dyspnea on exertion for the past 2 to 3 months. Notices this with walking around his home. Denies any specific orthopnea or PND. Has noticed abdominal distention and lower extremity edema during this timeframe. Says that his dry weight has been increased from 101 kg to 111 kg over the past few months due to issues with hypotension during sessions. He does take Midodrine  daily and has been instructed to take extra tablets prior to his sessions this week. Says they are possibly planning on a fourth session this week as well for additional fluid removal. He denies any specific exertional chest pain or palpitations.  Studies Reviewed:   EKG: EKG is ordered today and demonstrates:   EKG Interpretation Date/Time:  Wednesday May 30 2024 14:21:02 EDT Ventricular Rate:  88 PR Interval:    QRS Duration:  156 QT Interval:  440 QTC Calculation: 532 R Axis:   269  Text Interpretation: Sinus rhythm with PAC's and 1st degree AV Block. Right bundle branch block Inferior infarct pattern Reconfirmed by Johnson Laymon ([id number]) on 05/30/2024 3:06:04 PM       Echocardiogram: 05/2024 MPRESSIONS     1. Left ventricular ejection fraction, by estimation, is 35 to 40%. The  left ventricle has moderately decreased function. The left ventricle  demonstrates regional wall motion abnormalities (see scoring  diagram/findings for description). The left  ventricular internal cavity size was moderately dilated. There is mild  concentric left ventricular hypertrophy. Left ventricular diastolic  parameters are indeterminate.   2. Right ventricular systolic  function is mildly reduced. The right  ventricular size is normal. There is mildly elevated pulmonary artery  systolic pressure. The estimated right ventricular systolic pressure is  45.0 mmHg.   3. Left atrial size was moderately dilated.   4. Right atrial size was mildly dilated.   5. The mitral valve is degenerative. Moderate mitral valve regurgitation.   6. The aortic valve is tricuspid. Aortic valve regurgitation is mild. No  aortic stenosis is present. Aortic valve mean gradient measures 7.0 mmHg.   7. Aortic dilatation noted. There is mild dilatation of the ascending  aorta, measuring 41 mm.   8. The inferior vena cava is dilated in size with >50% respiratory  variability, suggesting right atrial pressure of 8 mmHg.   Comparison(s): A prior study was performed on 11/13/2020. Prior images  unable to be directly viewed.   LV Wall Scoring:  The posterior wall, apical septal segment, basal inferior segment, and  apex  are akinetic. The entire anterior wall, antero-lateral wall, anterior  septum,  mid and distal inferior wall, apical lateral segment, and mid inferoseptal  segment are hypokinetic. The basal inferoseptal segment is normal.   Physical Exam:   VS:  BP 104/60 (BP Location: Right Arm, Cuff Size: Large)   Pulse 70   Ht 6' 2 (1.88 m)   Wt 245 lb 12.8 oz (111.5 kg)   SpO2 95%   BMI 31.56 kg/m    Wt Readings from Last 3 Encounters:  05/30/24 245 lb 12.8 oz (111.5 kg)  05/25/24 245 lb (111.1 kg)  02/14/24 250 lb 12.8 oz (113.8 kg)     GEN: Well nourished, well developed male appearing in no acute distress NECK: No JVD; No carotid bruits CARDIAC: RRR, no murmurs, rubs, gallops RESPIRATORY:  Clear to auscultation without rales, wheezing or rhonchi  ABDOMEN: Appears non-distended. No obvious abdominal masses. EXTREMITIES: No clubbing or cyanosis. 1+ pitting edema bilaterally.  Distal pedal pulses are 2+ bilaterally.   Assessment and Plan:   1. HFrEF (heart  failure with reduced ejection fraction) (HCC) - He has a history of NICM with EF at 25 to 30% in 2004 and cardiac catheterization at that time showed no significant CAD. His EF had improved to 50 to 55% in 2022 but recent imaging showed his EF was again reduced at 35 to 40% and he did have multiple wall motion abnormalities.  - Previously reviewed with Dr. Alvan who recommended a [facility] for further evaluation given his recurrent cardiomyopathy. Reviewed with the patient and his family today and he is he is in agreement to proceed. Based off cath findings, may need to titrate Atorvastatin  as outlined below. He was previously on Lopressor  12.5 mg twice daily but discontinued during prior admission. Would not add back at this time given recurrent hypotension requiring Midodrine . BP still does not allow for the addition of Hydralazine or nitrates and he has not been on an ACE-I/ARB/ARNI/MRA/SGLT2 inhibitor given his ESRD. Fluid status managed by HD.   Informed Consent   Shared Decision Making/Informed Consent{ The risks [stroke (1 in 1000), death (1 in 1000), kidney failure [usually temporary] (1 in 500), bleeding (1 in 200), allergic reaction [possibly serious] (1 in 200)], benefits (diagnostic support and management of coronary artery disease)  and alternatives of a cardiac catheterization were discussed in detail with Mr. Eskew and he is willing to proceed.     2. Hypotension, unspecified hypotension type - BP is stable at 104/60 during today's visit but he does report frequent hypotension during dialysis and is taking Midodrine  10 mg 3 times daily and additional doses at times during dialysis sessions as needed.  3. Mixed hyperlipidemia - LDL was at 147 in 10/2023. He reports compliance with Atorvastatin  40 mg daily. We will request a copy of most recent labs in the interim. If LDL remains above goal, would plan to titrate Atorvastatin  to 80 mg daily.  4. PVC's (premature ventricular  contractions) - Noted on prior EKG tracings. He denies any recent palpitations and is no longer on beta-blocker therapy given issues with hypotension as discussed above.  5. End stage renal disease (HCC) - On HD - M/W/F schedule. Will request most recent labs from [facility].   6. Mitral Valve Regurgitation - Recent echo showed moderate MR, mild AI and mild dilatation of the ascending aorta at 41 mm. Would anticipate repeat imaging within the next year for reassessment.    Signed, Laymon CHRISTELLA Qua, PA-C

## 2024-05-30 NOTE — Progress Notes (Signed)
 Cardiology Office Note    Date:  05/30/2024  ID:  Todd Robertson, DOB 1946-08-13, MRN [id number] Cardiologist: Alvan Carrier, MD Cardiology APP:  Johnson Laymon HERO, PA-C { :  History of Present Illness:    Todd Robertson is a 78 y.o. 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  He was examined by myself in 09/2023 and reported having issues with hypotension during dialysis and was taking Midodrine  on a daily basis and holding Lopressor  the days of dialysis. GDMT for his cardiomyopathy had been limited and he was continued on Lopressor  12.5 mg twice daily on nondialysis days. Was not on an ACE-I/ARB/ARNI/MRA/SGLT2 inhibitor and BP had not allowed for hydralazine/nitrates. 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 Also noted to have hypotension during that timeframe and Lopressor  was discontinued.  The patient's wife contacted the office in 04/2024 reporting he was having worsening shortness of breath over the past [redacted] weeks along with swelling and dizziness. Dr. Alvan did recommend a follow-up echocardiogram. This was obtained on 05/23/2024 and showed his EF was reduced at 35 to 40% and he did have wall motion abnormalities with the posterior wall, apical septal segment, basal inferior segment and apex being akinetic.  Was also noted to have mildly reduced RV function, mildly elevated PASP at 45 mmHg, moderate MR and mild AI.   Did have mild dilatation of the ascending aorta at 41 mm. He also had follow-up with Pulmonology on 05/25/2024 and dyspnea was felt to be possibly due to fluid overload, NICM or COPD. PFT's and CXR were ordered for further assessment.  In talking with the patient, his wife and daughter (his other daughter joined by phone as well), he reports having worsening dyspnea on exertion for the past 2 to 3 months. Notices this with walking around his home. Denies any specific orthopnea or PND. Has noticed abdominal distention and lower extremity edema during this timeframe. Says that his dry weight has been increased from 101 kg to 111 kg over the past few months due to issues with hypotension during sessions. He does take Midodrine  daily and has been instructed to take extra tablets prior to his sessions this week. Says they are possibly planning on a fourth session this week as well for additional fluid removal. He denies any specific exertional chest pain or palpitations.  Studies Reviewed:   EKG: EKG is ordered today and demonstrates:   EKG Interpretation Date/Time:  Wednesday May 30 2024 14:21:02 EDT Ventricular Rate:  88 PR Interval:    QRS Duration:  156 QT Interval:  440 QTC Calculation: 532 R Axis:   269  Text Interpretation: Sinus rhythm with PAC's and 1st degree AV Block. Right bundle branch block Inferior infarct pattern Reconfirmed by Johnson Laymon ([id number]) on 05/30/2024 3:06:04 PM       Echocardiogram: 05/2024 MPRESSIONS     1. Left ventricular ejection fraction, by estimation, is 35 to 40%. The  left ventricle has moderately decreased function. The left ventricle  demonstrates regional wall motion abnormalities (see scoring  diagram/findings for description). The left  ventricular internal cavity size was moderately dilated. There is mild  concentric left ventricular hypertrophy. Left ventricular diastolic  parameters are indeterminate.   2. Right ventricular systolic  function is mildly reduced. The right  ventricular size is normal. There is mildly elevated pulmonary artery  systolic pressure. The estimated right ventricular systolic pressure is  45.0 mmHg.   3. Left atrial size was moderately dilated.   4. Right atrial size was mildly dilated.   5. The mitral valve is degenerative. Moderate mitral valve regurgitation.   6. The aortic valve is tricuspid. Aortic valve regurgitation is mild. No  aortic stenosis is present. Aortic valve mean gradient measures 7.0 mmHg.   7. Aortic dilatation noted. There is mild dilatation of the ascending  aorta, measuring 41 mm.   8. The inferior vena cava is dilated in size with >50% respiratory  variability, suggesting right atrial pressure of 8 mmHg.   Comparison(s): A prior study was performed on 11/13/2020. Prior images  unable to be directly viewed.   LV Wall Scoring:  The posterior wall, apical septal segment, basal inferior segment, and  apex  are akinetic. The entire anterior wall, antero-lateral wall, anterior  septum,  mid and distal inferior wall, apical lateral segment, and mid inferoseptal  segment are hypokinetic. The basal inferoseptal segment is normal.   Physical Exam:   VS:  BP 104/60 (BP Location: Right Arm, Cuff Size: Large)   Pulse 70   Ht 6' 2 (1.88 m)   Wt 245 lb 12.8 oz (111.5 kg)   SpO2 95%   BMI 31.56 kg/m    Wt Readings from Last 3 Encounters:  05/30/24 245 lb 12.8 oz (111.5 kg)  05/25/24 245 lb (111.1 kg)  02/14/24 250 lb 12.8 oz (113.8 kg)     GEN: Well nourished, well developed male appearing in no acute distress NECK: No JVD; No carotid bruits CARDIAC: RRR, no murmurs, rubs, gallops RESPIRATORY:  Clear to auscultation without rales, wheezing or rhonchi  ABDOMEN: Appears non-distended. No obvious abdominal masses. EXTREMITIES: No clubbing or cyanosis. 1+ pitting edema bilaterally.  Distal pedal pulses are 2+ bilaterally.   Assessment and Plan:   1. HFrEF (heart  failure with reduced ejection fraction) (HCC) - He has a history of NICM with EF at 25 to 30% in 2004 and cardiac catheterization at that time showed no significant CAD. His EF had improved to 50 to 55% in 2022 but recent imaging showed his EF was again reduced at 35 to 40% and he did have multiple wall motion abnormalities.  - Previously reviewed with Dr. Alvan who recommended a [facility] for further evaluation given his recurrent cardiomyopathy. Reviewed with the patient and his family today and he is he is in agreement to proceed. Based off cath findings, may need to titrate Atorvastatin  as outlined below. He was previously on Lopressor  12.5 mg twice daily but discontinued during prior admission. Would not add back at this time given recurrent hypotension requiring Midodrine . BP still does not allow for the addition of Hydralazine or nitrates and he has not been on an ACE-I/ARB/ARNI/MRA/SGLT2 inhibitor given his ESRD. Fluid status managed by HD.   Informed Consent   Shared Decision Making/Informed Consent{ The risks [stroke (1 in 1000), death (1 in 1000), kidney failure [usually temporary] (1 in 500), bleeding (1 in 200), allergic reaction [possibly serious] (1 in 200)], benefits (diagnostic support and management of coronary artery disease)  and alternatives of a cardiac catheterization were discussed in detail with Todd Robertson and he is willing to proceed.     2. Hypotension, unspecified hypotension type - BP is stable at 104/60 during today's visit but he does report frequent hypotension during dialysis and is taking Midodrine  10 mg 3 times daily and additional doses at times during dialysis sessions as needed.  3. Mixed hyperlipidemia - LDL was at 147 in 10/2023. He reports compliance with Atorvastatin  40 mg daily. We will request a copy of most recent labs in the interim. If LDL remains above goal, would plan to titrate Atorvastatin  to 80 mg daily.  4. PVC's (premature ventricular  contractions) - Noted on prior EKG tracings. He denies any recent palpitations and is no longer on beta-blocker therapy given issues with hypotension as discussed above.  5. End stage renal disease (HCC) - On HD - M/W/F schedule. Will request most recent labs from [facility].   6. Mitral Valve Regurgitation - Recent echo showed moderate MR, mild AI and mild dilatation of the ascending aorta at 41 mm. Would anticipate repeat imaging within the next year for reassessment.    Signed, Laymon CHRISTELLA Qua, PA-C

## 2024-05-30 NOTE — Patient Instructions (Signed)
 Medication Instructions:  Your physician recommends that you continue on your current medications as directed. Please refer to the Current Medication list given to you today.  Please take Aspirin  81 mg The morning of your procedure.   *If you need a refill on your cardiac medications before your next appointment, please call your pharmacy*  Lab Work: Your physician recommends that you return for lab work in: Today ( CBC, BMET)   If you have labs (blood work) drawn today and your tests are completely normal, you will receive your results only by: MyChart Message (if you have MyChart) OR A paper copy in the mail If you have any lab test that is abnormal or we need to change your treatment, we will call you to review the results.  Testing/Procedures: Your physician has requested that you have a cardiac catheterization. Cardiac catheterization is used to diagnose and/or treat various heart conditions. Doctors may recommend this procedure for a number of different reasons. The most common reason is to evaluate chest pain. Chest pain can be a symptom of coronary artery disease (CAD), and cardiac catheterization can show whether plaque is narrowing or blocking your heart's arteries. This procedure is also used to evaluate the valves, as well as measure the blood flow and oxygen levels in different parts of your heart. For further information please visit [email or web address] Please follow instruction sheet, as given.   Follow-Up: At [facility], you and your health needs are our priority.  As part of our continuing mission to provide you with exceptional heart care, our providers are all part of one team.  This team includes your primary Cardiologist (physician) and Advanced Practice Providers or APPs (Physician Assistants and Nurse Practitioners) who all work together to provide you with the care you need, when you need it.  Your next appointment:   1 month(s)  Provider:   You may see  Alvan Carrier, MD or one of the following Advanced Practice Providers on your designated Care Team:   Laymon Qua, PA-C  Scotesia Aspinwall, [place] Olivia Pavy, [place]     We recommend signing up for the patient portal called MyChart.  Sign up information is provided on this After Visit Summary.  MyChart is used to connect with patients for Virtual Visits (Telemedicine).  Patients are able to view lab/test results, encounter notes, upcoming appointments, etc.  Non-urgent messages can be sent to your provider as well.   To learn more about what you can do with MyChart, go to [email or web address].   Other Instructions Thank you for choosing [facility]!     [facility] A DE[facility] OF [facility] [address] [place] [address] Dept: [phone] Loc: [phone]  Todd Robertson  05/30/2024  You are scheduled for a Cardiac Catheterization on Thursday, July 31 with Dr. Lonni Cash.  1. Please arrive at the [facility] (Main Entrance A) at [facility]: [address] [place], [place] [address] at 7:30 AM (This time is 2 hour(s) before your procedure to ensure your preparation).   Free valet parking service is available. You will check in at ADMITTING. The support person will be asked to wait in the waiting room.  It is OK to have someone drop you off and come back when you are ready to be discharged.    Special note: Every effort is made to have your procedure done on time. Please understand that emergencies sometimes delay  scheduled procedures.  2. Diet: Do not eat solid foods after midnight.  The patient may have clear liquids until 5am upon the day of the procedure.  3. Labs: You will need to have blood drawn on Wednesday, July 23 at [facility]. You do not need to be fasting.  4. Medication instructions in preparation for your procedure:   Contrast Allergy: No  On the  morning of your procedure, take your Aspirin  81 mg and any morning medicines NOT listed above.  You may use sips of water .  5. Plan to go home the same day, you will only stay overnight if medically necessary. 6. Bring a current list of your medications and current insurance cards. 7. You MUST have a responsible person to drive you home. 8. Someone MUST be with you the first 24 hours after you arrive home or your discharge will be delayed. 9. Please wear clothes that are easy to get on and off and wear slip-on shoes.  Thank you for allowing us  to care for you!   -- [facility]

## 2024-05-31 DIAGNOSIS — E8779 Other fluid overload: Secondary | ICD-10-CM | POA: Diagnosis not present

## 2024-05-31 DIAGNOSIS — Z992 Dependence on renal dialysis: Secondary | ICD-10-CM | POA: Diagnosis not present

## 2024-05-31 DIAGNOSIS — N186 End stage renal disease: Secondary | ICD-10-CM | POA: Diagnosis not present

## 2024-06-01 DIAGNOSIS — D631 Anemia in chronic kidney disease: Secondary | ICD-10-CM | POA: Diagnosis not present

## 2024-06-01 DIAGNOSIS — N2581 Secondary hyperparathyroidism of renal origin: Secondary | ICD-10-CM | POA: Diagnosis not present

## 2024-06-01 DIAGNOSIS — D509 Iron deficiency anemia, unspecified: Secondary | ICD-10-CM | POA: Diagnosis not present

## 2024-06-01 DIAGNOSIS — Z992 Dependence on renal dialysis: Secondary | ICD-10-CM | POA: Diagnosis not present

## 2024-06-01 DIAGNOSIS — N186 End stage renal disease: Secondary | ICD-10-CM | POA: Diagnosis not present

## 2024-06-01 DIAGNOSIS — N250 Renal osteodystrophy: Secondary | ICD-10-CM | POA: Diagnosis not present

## 2024-06-04 DIAGNOSIS — N2581 Secondary hyperparathyroidism of renal origin: Secondary | ICD-10-CM | POA: Diagnosis not present

## 2024-06-04 DIAGNOSIS — D509 Iron deficiency anemia, unspecified: Secondary | ICD-10-CM | POA: Diagnosis not present

## 2024-06-04 DIAGNOSIS — N186 End stage renal disease: Secondary | ICD-10-CM | POA: Diagnosis not present

## 2024-06-04 DIAGNOSIS — N250 Renal osteodystrophy: Secondary | ICD-10-CM | POA: Diagnosis not present

## 2024-06-04 DIAGNOSIS — D631 Anemia in chronic kidney disease: Secondary | ICD-10-CM | POA: Diagnosis not present

## 2024-06-04 DIAGNOSIS — Z992 Dependence on renal dialysis: Secondary | ICD-10-CM | POA: Diagnosis not present

## 2024-06-05 ENCOUNTER — Telehealth: Payer: Self-pay | Admitting: *Deleted

## 2024-06-05 NOTE — Telephone Encounter (Signed)
 Cardiac Catheterization scheduled at [facility] for: Thursday June 07, 2024 9 AM Arrival time [facility] Main Entrance A at: 7 AM  Nothing to eat after midnight prior to procedure, may drink clear liquids until leaving for hospital.  Medication instructions: -Usual morning medications can be taken with sips of water  including aspirin  81 mg.  Plan to go home the same day, you will only stay overnight if medically necessary.  You must have responsible adult to drive you home.  Someone must be with you the first 24 hours after you arrive home.  Reviewed procedure instructions with patient's wife (DPR), confirmed dialysis MWF.

## 2024-06-06 DIAGNOSIS — N186 End stage renal disease: Secondary | ICD-10-CM | POA: Diagnosis not present

## 2024-06-06 DIAGNOSIS — D631 Anemia in chronic kidney disease: Secondary | ICD-10-CM | POA: Diagnosis not present

## 2024-06-06 DIAGNOSIS — Z992 Dependence on renal dialysis: Secondary | ICD-10-CM | POA: Diagnosis not present

## 2024-06-06 DIAGNOSIS — N2581 Secondary hyperparathyroidism of renal origin: Secondary | ICD-10-CM | POA: Diagnosis not present

## 2024-06-06 DIAGNOSIS — D509 Iron deficiency anemia, unspecified: Secondary | ICD-10-CM | POA: Diagnosis not present

## 2024-06-06 DIAGNOSIS — N250 Renal osteodystrophy: Secondary | ICD-10-CM | POA: Diagnosis not present

## 2024-06-07 ENCOUNTER — Other Ambulatory Visit: Payer: Self-pay

## 2024-06-07 ENCOUNTER — Encounter (HOSPITAL_COMMUNITY): Payer: Self-pay | Admitting: Cardiovascular Disease

## 2024-06-07 ENCOUNTER — Ambulatory Visit (HOSPITAL_COMMUNITY)
Admission: RE | Admit: 2024-06-07 | Discharge: 2024-06-07 | Disposition: A | Discharge status: Home / Self Care | Attending: Cardiovascular Disease | Admitting: Cardiovascular Disease

## 2024-06-07 ENCOUNTER — Encounter (HOSPITAL_COMMUNITY)
Admission: RE | Disposition: A | Discharge status: Home / Self Care | Payer: Self-pay | Source: Home / Self Care | Attending: Cardiovascular Disease

## 2024-06-07 DIAGNOSIS — N186 End stage renal disease: Secondary | ICD-10-CM | POA: Insufficient documentation

## 2024-06-07 DIAGNOSIS — Z79899 Other long term (current) drug therapy: Secondary | ICD-10-CM | POA: Diagnosis not present

## 2024-06-07 DIAGNOSIS — I5022 Chronic systolic (congestive) heart failure: Secondary | ICD-10-CM | POA: Diagnosis not present

## 2024-06-07 DIAGNOSIS — Z905 Acquired absence of kidney: Secondary | ICD-10-CM | POA: Diagnosis not present

## 2024-06-07 DIAGNOSIS — I2510 Atherosclerotic heart disease of native coronary artery without angina pectoris: Secondary | ICD-10-CM | POA: Diagnosis not present

## 2024-06-07 DIAGNOSIS — I953 Hypotension of hemodialysis: Secondary | ICD-10-CM | POA: Insufficient documentation

## 2024-06-07 DIAGNOSIS — Z992 Dependence on renal dialysis: Secondary | ICD-10-CM | POA: Insufficient documentation

## 2024-06-07 DIAGNOSIS — I493 Ventricular premature depolarization: Secondary | ICD-10-CM | POA: Insufficient documentation

## 2024-06-07 DIAGNOSIS — I428 Other cardiomyopathies: Secondary | ICD-10-CM | POA: Insufficient documentation

## 2024-06-07 DIAGNOSIS — I340 Nonrheumatic mitral (valve) insufficiency: Secondary | ICD-10-CM | POA: Diagnosis not present

## 2024-06-07 HISTORY — PX: RIGHT/LEFT HEART CATH AND CORONARY ANGIOGRAPHY: CATH118266

## 2024-06-07 LAB — POCT I-STAT 7, (LYTES, BLD GAS, ICA,H+H)
Acid-Base Excess: 2 mmol/L (ref 0.0–2.0)
Bicarbonate: 26.8 mmol/L (ref 20.0–28.0)
Calcium, Ion: 1.03 mmol/L — ABNORMAL LOW (ref 1.15–1.40)
HCT: 29 % — ABNORMAL LOW (ref 39.0–52.0)
Hemoglobin: 9.9 g/dL — ABNORMAL LOW (ref 13.0–17.0)
O2 Saturation: 96 %
Potassium: 3.5 mmol/L (ref 3.5–5.1)
Sodium: 143 mmol/L (ref 135–145)
TCO2: 28 mmol/L (ref 22–32)
pCO2 arterial: 41.4 mmHg (ref 32–48)
pH, Arterial: 7.419 (ref 7.35–7.45)
pO2, Arterial: 79 mmHg — ABNORMAL LOW (ref 83–108)

## 2024-06-07 LAB — POCT I-STAT EG7
Acid-base deficit: 5 mmol/L — ABNORMAL HIGH (ref 0.0–2.0)
Bicarbonate: 20.8 mmol/L (ref 20.0–28.0)
Calcium, Ion: 0.78 mmol/L — CL (ref 1.15–1.40)
HCT: 30 % — ABNORMAL LOW (ref 39.0–52.0)
Hemoglobin: 10.2 g/dL — ABNORMAL LOW (ref 13.0–17.0)
O2 Saturation: 60 %
Potassium: 2.7 mmol/L — CL (ref 3.5–5.1)
Sodium: 131 mmol/L — ABNORMAL LOW (ref 135–145)
TCO2: 22 mmol/L (ref 22–32)
pCO2, Ven: 42.2 mmHg — ABNORMAL LOW (ref 44–60)
pH, Ven: 7.301 (ref 7.25–7.43)
pO2, Ven: 34 mmHg (ref 32–45)

## 2024-06-07 LAB — GLUCOSE, CAPILLARY
Glucose-Capillary: 86 mg/dL (ref 70–99)
Glucose-Capillary: 80 mg/dL (ref 70–99)

## 2024-06-07 MED ORDER — ACETAMINOPHEN 325 MG PO TABS: 650.0000 mg | ORAL_TABLET | ORAL | Status: DC | PRN

## 2024-06-07 MED ORDER — FENTANYL CITRATE (PF) 100 MCG/2ML IJ SOLN
INTRAMUSCULAR | Status: DC | PRN
  Administered 2024-06-07: 25 ug via INTRAVENOUS

## 2024-06-07 MED ORDER — LIDOCAINE HCL (PF) 1 % IJ SOLN
INTRAMUSCULAR | Status: DC | PRN
Start: 2024-06-07 — End: 2024-06-07
  Administered 2024-06-07: 10 mL

## 2024-06-07 MED ORDER — FENTANYL CITRATE (PF) 100 MCG/2ML IJ SOLN
INTRAMUSCULAR | Status: AC
  Filled 2024-06-07: qty 2

## 2024-06-07 MED ORDER — MIDAZOLAM HCL 2 MG/2ML IJ SOLN
INTRAMUSCULAR | Status: AC
  Filled 2024-06-07: qty 2

## 2024-06-07 MED ORDER — HEPARIN (PORCINE) IN NACL 1000-0.9 UT/500ML-% IV SOLN
INTRAVENOUS | Status: DC | PRN
  Administered 2024-06-07: 1000 mL via SURGICAL_CAVITY

## 2024-06-07 MED ORDER — ONDANSETRON HCL 4 MG/2ML IJ SOLN: 4.0000 mg | Freq: Four times a day (QID) | INTRAMUSCULAR | Status: DC | PRN

## 2024-06-07 MED ORDER — LABETALOL HCL 5 MG/ML IV SOLN
10.0000 mg | INTRAVENOUS | Status: DC | PRN
Start: 2024-06-07 — End: 2024-06-07

## 2024-06-07 MED ORDER — FREE WATER: 500.0000 mL | Freq: Once | Status: DC

## 2024-06-07 MED ORDER — SODIUM CHLORIDE 0.9 % IV SOLN: INTRAVENOUS | Status: DC

## 2024-06-07 MED ORDER — LIDOCAINE HCL (PF) 1 % IJ SOLN
INTRAMUSCULAR | Status: AC
  Filled 2024-06-07: qty 30

## 2024-06-07 MED ORDER — IOHEXOL 350 MG/ML SOLN
INTRAVENOUS | Status: DC | PRN
  Administered 2024-06-07: 55 mL

## 2024-06-07 MED ORDER — SODIUM CHLORIDE 0.9 % IV SOLN: 250.0000 mL | INTRAVENOUS | Status: DC | PRN

## 2024-06-07 MED ORDER — MIDAZOLAM HCL 2 MG/2ML IJ SOLN
INTRAMUSCULAR | Status: DC | PRN
  Administered 2024-06-07: 1 mg via INTRAVENOUS

## 2024-06-07 MED ORDER — ASPIRIN 81 MG PO CHEW: 81.0000 mg | CHEWABLE_TABLET | ORAL | Status: DC

## 2024-06-07 MED ORDER — SODIUM CHLORIDE 0.9% FLUSH: 3.0000 mL | INTRAVENOUS | Status: DC | PRN

## 2024-06-07 MED ORDER — HYDRALAZINE HCL 20 MG/ML IJ SOLN
10.0000 mg | INTRAMUSCULAR | Status: DC | PRN
Start: 2024-06-07 — End: 2024-06-07

## 2024-06-07 MED ORDER — SODIUM CHLORIDE 0.9% FLUSH: 3.0000 mL | Freq: Two times a day (BID) | INTRAVENOUS | Status: DC

## 2024-06-07 NOTE — Interval H&P Note (Signed)
 History and Physical Interval Note:  06/07/2024 8:43 AM  Lamar KANDICE Gearing  has presented today for surgery, with the diagnosis of cardiomyopathy.  The various methods of treatment have been discussed with the patient and family. After consideration of risks, benefits and other options for treatment, the patient has consented to  Procedure(s): RIGHT/LEFT HEART CATH AND CORONARY ANGIOGRAPHY (N/A) as a surgical intervention.  The patient's history has been reviewed, patient examined, no change in status, stable for surgery.  I have reviewed the patient's chart and labs.  Questions were answered to the patient's satisfaction.    Cath Lab Visit (complete for each Cath Lab visit)  Clinical Evaluation Leading to the Procedure:   ACS: No.  Non-ACS:    Anginal Classification: No Symptoms  Anti-ischemic medical therapy: No Therapy  Non-Invasive Test Results: No non-invasive testing performed  Prior CABG: No previous CABG        Todd Robertson

## 2024-06-07 NOTE — Progress Notes (Signed)
 Patient and wife was given discharge instructions. Both verbalized understanding.

## 2024-06-08 DIAGNOSIS — D631 Anemia in chronic kidney disease: Secondary | ICD-10-CM | POA: Diagnosis not present

## 2024-06-08 DIAGNOSIS — Z992 Dependence on renal dialysis: Secondary | ICD-10-CM | POA: Diagnosis not present

## 2024-06-08 DIAGNOSIS — D509 Iron deficiency anemia, unspecified: Secondary | ICD-10-CM | POA: Diagnosis not present

## 2024-06-08 DIAGNOSIS — N186 End stage renal disease: Secondary | ICD-10-CM | POA: Diagnosis not present

## 2024-06-08 DIAGNOSIS — N2581 Secondary hyperparathyroidism of renal origin: Secondary | ICD-10-CM | POA: Diagnosis not present

## 2024-06-08 DIAGNOSIS — N250 Renal osteodystrophy: Secondary | ICD-10-CM | POA: Diagnosis not present

## 2024-06-11 DIAGNOSIS — D509 Iron deficiency anemia, unspecified: Secondary | ICD-10-CM | POA: Diagnosis not present

## 2024-06-11 DIAGNOSIS — N250 Renal osteodystrophy: Secondary | ICD-10-CM | POA: Diagnosis not present

## 2024-06-11 DIAGNOSIS — Z992 Dependence on renal dialysis: Secondary | ICD-10-CM | POA: Diagnosis not present

## 2024-06-11 DIAGNOSIS — N2581 Secondary hyperparathyroidism of renal origin: Secondary | ICD-10-CM | POA: Diagnosis not present

## 2024-06-11 DIAGNOSIS — N186 End stage renal disease: Secondary | ICD-10-CM | POA: Diagnosis not present

## 2024-06-11 DIAGNOSIS — D631 Anemia in chronic kidney disease: Secondary | ICD-10-CM | POA: Diagnosis not present

## 2024-06-13 DIAGNOSIS — D509 Iron deficiency anemia, unspecified: Secondary | ICD-10-CM | POA: Diagnosis not present

## 2024-06-13 DIAGNOSIS — N250 Renal osteodystrophy: Secondary | ICD-10-CM | POA: Diagnosis not present

## 2024-06-13 DIAGNOSIS — N2581 Secondary hyperparathyroidism of renal origin: Secondary | ICD-10-CM | POA: Diagnosis not present

## 2024-06-13 DIAGNOSIS — D631 Anemia in chronic kidney disease: Secondary | ICD-10-CM | POA: Diagnosis not present

## 2024-06-13 DIAGNOSIS — N186 End stage renal disease: Secondary | ICD-10-CM | POA: Diagnosis not present

## 2024-06-13 DIAGNOSIS — Z992 Dependence on renal dialysis: Secondary | ICD-10-CM | POA: Diagnosis not present

## 2024-06-14 ENCOUNTER — Telehealth: Payer: Self-pay | Admitting: Cardiovascular Disease

## 2024-06-14 NOTE — Telephone Encounter (Signed)
 Called pt's wife. I read the instructions from pt's AVS. She verbalized understanding and they will remove the bandage today.

## 2024-06-14 NOTE — Telephone Encounter (Signed)
 Wife would like to know when to change dressing from 7/31 procedure. Please advise.

## 2024-06-15 DIAGNOSIS — N250 Renal osteodystrophy: Secondary | ICD-10-CM | POA: Diagnosis not present

## 2024-06-15 DIAGNOSIS — N2581 Secondary hyperparathyroidism of renal origin: Secondary | ICD-10-CM | POA: Diagnosis not present

## 2024-06-15 DIAGNOSIS — D631 Anemia in chronic kidney disease: Secondary | ICD-10-CM | POA: Diagnosis not present

## 2024-06-15 DIAGNOSIS — Z992 Dependence on renal dialysis: Secondary | ICD-10-CM | POA: Diagnosis not present

## 2024-06-15 DIAGNOSIS — D509 Iron deficiency anemia, unspecified: Secondary | ICD-10-CM | POA: Diagnosis not present

## 2024-06-15 DIAGNOSIS — N186 End stage renal disease: Secondary | ICD-10-CM | POA: Diagnosis not present

## 2024-06-16 DIAGNOSIS — Z992 Dependence on renal dialysis: Secondary | ICD-10-CM | POA: Diagnosis not present

## 2024-06-16 DIAGNOSIS — N186 End stage renal disease: Secondary | ICD-10-CM | POA: Diagnosis not present

## 2024-06-16 DIAGNOSIS — E8779 Other fluid overload: Secondary | ICD-10-CM | POA: Diagnosis not present

## 2024-06-18 DIAGNOSIS — N2581 Secondary hyperparathyroidism of renal origin: Secondary | ICD-10-CM | POA: Diagnosis not present

## 2024-06-18 DIAGNOSIS — Z992 Dependence on renal dialysis: Secondary | ICD-10-CM | POA: Diagnosis not present

## 2024-06-18 DIAGNOSIS — N250 Renal osteodystrophy: Secondary | ICD-10-CM | POA: Diagnosis not present

## 2024-06-18 DIAGNOSIS — D631 Anemia in chronic kidney disease: Secondary | ICD-10-CM | POA: Diagnosis not present

## 2024-06-18 DIAGNOSIS — D509 Iron deficiency anemia, unspecified: Secondary | ICD-10-CM | POA: Diagnosis not present

## 2024-06-18 DIAGNOSIS — Z8546 Personal history of malignant neoplasm of prostate: Secondary | ICD-10-CM | POA: Diagnosis not present

## 2024-06-18 DIAGNOSIS — N186 End stage renal disease: Secondary | ICD-10-CM | POA: Diagnosis not present

## 2024-06-19 ENCOUNTER — Ambulatory Visit: Payer: Self-pay

## 2024-06-19 LAB — PSA: Prostate Specific Ag, Serum: 0.8 ng/mL (ref 0.0–4.0)

## 2024-06-20 ENCOUNTER — Other Ambulatory Visit: Payer: Self-pay

## 2024-06-20 ENCOUNTER — Encounter (HOSPITAL_COMMUNITY): Payer: Self-pay | Admitting: Vascular Surgery

## 2024-06-20 ENCOUNTER — Ambulatory Visit (HOSPITAL_COMMUNITY)
Admission: RE | Admit: 2024-06-20 | Discharge: 2024-06-20 | Disposition: A | Discharge status: Home / Self Care | Attending: Vascular Surgery | Admitting: Vascular Surgery

## 2024-06-20 ENCOUNTER — Encounter (HOSPITAL_COMMUNITY)
Admission: RE | Disposition: A | Discharge status: Home / Self Care | Payer: Self-pay | Source: Home / Self Care | Attending: Vascular Surgery

## 2024-06-20 ENCOUNTER — Other Ambulatory Visit: Payer: Medicare Other

## 2024-06-20 DIAGNOSIS — E1122 Type 2 diabetes mellitus with diabetic chronic kidney disease: Secondary | ICD-10-CM | POA: Insufficient documentation

## 2024-06-20 DIAGNOSIS — I120 Hypertensive chronic kidney disease with stage 5 chronic kidney disease or end stage renal disease: Secondary | ICD-10-CM | POA: Insufficient documentation

## 2024-06-20 DIAGNOSIS — D509 Iron deficiency anemia, unspecified: Secondary | ICD-10-CM | POA: Diagnosis not present

## 2024-06-20 DIAGNOSIS — Y832 Surgical operation with anastomosis, bypass or graft as the cause of abnormal reaction of the patient, or of later complication, without mention of misadventure at the time of the procedure: Secondary | ICD-10-CM | POA: Insufficient documentation

## 2024-06-20 DIAGNOSIS — T82858A Stenosis of vascular prosthetic devices, implants and grafts, initial encounter: Secondary | ICD-10-CM | POA: Diagnosis not present

## 2024-06-20 DIAGNOSIS — N2581 Secondary hyperparathyroidism of renal origin: Secondary | ICD-10-CM | POA: Diagnosis not present

## 2024-06-20 DIAGNOSIS — N186 End stage renal disease: Secondary | ICD-10-CM | POA: Diagnosis not present

## 2024-06-20 DIAGNOSIS — Z87891 Personal history of nicotine dependence: Secondary | ICD-10-CM | POA: Diagnosis not present

## 2024-06-20 DIAGNOSIS — Z992 Dependence on renal dialysis: Secondary | ICD-10-CM | POA: Diagnosis not present

## 2024-06-20 DIAGNOSIS — E0590 Thyrotoxicosis, unspecified without thyrotoxic crisis or storm: Secondary | ICD-10-CM | POA: Diagnosis not present

## 2024-06-20 DIAGNOSIS — D631 Anemia in chronic kidney disease: Secondary | ICD-10-CM | POA: Diagnosis not present

## 2024-06-20 DIAGNOSIS — E042 Nontoxic multinodular goiter: Secondary | ICD-10-CM | POA: Diagnosis not present

## 2024-06-20 DIAGNOSIS — N250 Renal osteodystrophy: Secondary | ICD-10-CM | POA: Diagnosis not present

## 2024-06-20 HISTORY — PX: VENOUS ANGIOPLASTY: CATH118376

## 2024-06-20 HISTORY — PX: A/V SHUNT INTERVENTION: CATH118220

## 2024-06-20 SURGERY — A/V SHUNT INTERVENTION
Anesthesia: LOCAL | Site: Arm Upper | Laterality: Left

## 2024-06-20 MED ORDER — LIDOCAINE HCL (PF) 1 % IJ SOLN
INTRAMUSCULAR | Status: AC
  Filled 2024-06-20: qty 30

## 2024-06-20 MED ORDER — LIDOCAINE HCL (PF) 1 % IJ SOLN
INTRAMUSCULAR | Status: DC | PRN
  Administered 2024-06-20 (×2): 2 mL via SUBCUTANEOUS

## 2024-06-20 MED ORDER — IODIXANOL 320 MG/ML IV SOLN
INTRAVENOUS | Status: DC | PRN
  Administered 2024-06-20 (×2): 20 mL via INTRAVENOUS

## 2024-06-20 MED ORDER — HEPARIN (PORCINE) IN NACL 1000-0.9 UT/500ML-% IV SOLN
INTRAVENOUS | Status: DC | PRN
  Administered 2024-06-20 (×2): 500 mL

## 2024-06-20 SURGICAL SUPPLY — 9 items
BALLOON MUSTANG 7.0X40 75 (BALLOONS) IMPLANT
KIT ENCORE 26 ADVANTAGE (KITS) IMPLANT
KIT MICROPUNCTURE NIT STIFF (SHEATH) IMPLANT
SHEATH PINNACLE R/O II 6F 4CM (SHEATH) IMPLANT
SHEATH PROBE COVER 6X72 (BAG) IMPLANT
STOPCOCK MORSE 400PSI 3WAY (MISCELLANEOUS) IMPLANT
TRAY PV CATH (CUSTOM PROCEDURE TRAY) ×2 IMPLANT
TUBING CIL FLEX 10 FLL-RA (TUBING) IMPLANT
WIRE BENTSON .035X145CM (WIRE) IMPLANT

## 2024-06-20 NOTE — Op Note (Signed)
    Patient name: Todd Robertson MRN: [id number] DOB: January 31, 1946 Sex: male  06/20/2024 Pre-operative Diagnosis: ESRD on HD Post-operative diagnosis:  Same Surgeon:  Norman GORMAN Serve, MD Procedure Performed:  Ultrasound-guided access of left arm AVG Fistulogram and central venogram Balloon angioplasty of mid graft stenosis, 7 mm x 40 mm Mustang balloon  Indications: Todd Robertson is a 78 year old male with ESRD on HD presenting to the HD access center for fistulogram today.  At his last dialysis session they noticed a change in his bruit.  His last session was on Monday.  We reviewed the risks and benefits of fistulogram with intervention and he elected to proceed.  Findings:  Widely patent central venous system with a large axillary outflow vein.  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  Widely patent arterial anastomosis.   Procedure:  The patient was identified in the holding area and taken to the cath lab  The patient was then placed supine on the table and prepped and draped in the usual sterile fashion.  A time out was called.  Ultrasound was used to evaluate the left arm AV access. This was accessed under u/s guidance. An 018 wire was advanced without resistance, a micropuncture sheath was placed and fistulagram obtained which demonstrated the above findings.  This access was then upsized to a 59F short sheath over a Bentson.  The lesion was crossed and then treated with a 7 mm x 40 mm Mustang balloon with a minimal residual stenosis.  The sheath and wire were removed and a 4-0 Monocryl figure-of-eight suture was placed for hemostasis.  Contrast: 20 cc Sedation: None  Impression: Successful balloon angioplasty of the a 70% mid graft stenosis down to 0%.   Norman GORMAN Serve MD [facility] Office: [phone]

## 2024-06-20 NOTE — H&P (Signed)
 HD ACCESS CENTER H&P   Patient ID: MASEN LUALLEN, male   DOB: Feb 17, 1946, 78 y.o.   MRN: [id number]  Subjective:     HPI DREYDEN ROHRMAN is a 78 y.o. male with ESRD presenting to the HD access center for intervention.  Past Medical History:  Diagnosis Date   Arthritis    Cancer (HCC)    prostate   Cardiomyopathy    secondary   CKD (chronic kidney disease)    DM2 (diabetes mellitus, type 2) (HCC)    Gout    HTN (hypertension)    unspec   Hypercholesterolemia    Nonischemic cardiomyopathy (HCC)    a. EF 40-50% by most recent 2-D echo b. EF 25% by cardiac cath in 2004   Overweight(278.02)    Family History  Problem Relation Age of Onset   Hypertension Mother    Kidney disease Mother    Heart disease Father    Kidney disease Other    Past Surgical History:  Procedure Laterality Date   A/V FISTULAGRAM Left 01/04/2024   Procedure: A/V Fistulagram;  Surgeon: Marea Selinda RAMAN, MD;  Location: [facility] INVASIVE CV LAB;  Service: Cardiovascular;  Laterality: Left;   AV FISTULA PLACEMENT Left 06/06/2020   Procedure: LEFT ARM ARTERIOVENOUS (AV) GRAFT USING 45CM GORTEX;  Surgeon: Oris Krystal FALCON, MD;  Location: [facility];  Service: Vascular;  Laterality: Left;   AV FISTULA PLACEMENT Left 06/08/2022   Procedure: INSERTION OF ARTERIOVENOUS GORE-TEX GRAFT UPPER ARM;  Surgeon: Oris Krystal FALCON, MD;  Location: [facility];  Service: Vascular;  Laterality: Left;   AVGG REMOVAL Left 07/27/2022   Procedure: EXCISION OF INFECTED PORTION OF LEFT FOREARM ARTERIOVENOUS GORETEX GRAFT (AVGG);  Surgeon: Oris Krystal FALCON, MD;  Location: [facility];  Service: Vascular;  Laterality: Left;   COLONOSCOPY     DIALYSIS/PERMA CATHETER INSERTION Right 01/04/2024   Procedure: DIALYSIS/PERMA CATHETER INSERTION;  Surgeon: Marea Selinda RAMAN, MD;  Location: [facility] INVAS[facility] CV LAB;  Service: Cardiovascular;  Laterality: Right;   HERNIA REPAIR     INSERTION OF DIALYSIS CATHETER Right 06/06/2020   Procedure: INSERTION OF DIALYSIS CATHETER USING 23CM  DOUBLE LUMEN CATHETER;  Surgeon: Oris Krystal FALCON, MD;  Location: [facility];  Service: Vascular;  Laterality: Right;   IR ANGIOGRAM EXTREMITY LEFT  10/25/2022   IR FLUORO GUIDE CV LINE RIGHT  05/20/2020   IR THROMBECTOMY AV FISTULA W/THROMBOLYSIS/PTA INC/SHUNT/IMG LEFT Left 10/25/2022   IR THROMBECTOMY AV FISTULA W/THROMBOLYSIS/PTA INC/SHUNT/IMG LEFT Left 08/05/2023   IR US  GUIDE VASC ACCESS LEFT  10/25/2022   IR US  GUIDE VASC ACCESS LEFT  08/09/2023   IR US  GUIDE VASC ACCESS RIGHT  05/20/2020   KNEE ARTHROPLASTY Left 08/08/2017   Procedure: LEFT TOTAL KNEE ARTHROPLASTY WITH COMPUTER NAVIGATION;  Surgeon: Fidel Rogue, MD;  Location: [facility];  Service: Orthopedics;  Laterality: Left;  Needs RNFA   KNEE ARTHROPLASTY Right 11/17/2017   Procedure: RIGHT TOTAL KNEE ARTHROPLASTY WITH COMPUTER NAVIGATION;  Surgeon: Fidel Rogue, MD;  Location: [facility];  Service: Orthopedics;  Laterality: Right;  NEEDS RNFA   PERIPHERAL VASCULAR BALLOON ANGIOPLASTY Left 01/10/2024   Procedure: PERIPHERAL VASCULAR BALLOON ANGIOPLASTY;  Surgeon: Melia Lynwood ORN, MD;  Location: [facility];  Service: Cardiovascular;  Laterality: Left;  VA, intragraft, AA   PERIPHERAL VASCULAR THROMBECTOMY Left 08/22/2023   Procedure: PERIPHERAL VASCULAR THROMBECTOMY;  Surgeon: Marea Selinda RAMAN, MD;  Location: [facility];  Service: Cardiovascular;  Laterality: Left;   PERIPHERAL VASCULAR THROMBECTOMY N/A 01/10/2024  Procedure: PERIPHERAL VASCULAR THROMBECTOMY;  Surgeon: Melia Lynwood ORN, MD;  [facility] INV[facility] CV [facility];  Service: Cardiovascular;  Laterality: N/A;   REMOVAL OF A DIALYSIS CATHETER Right 09/07/2022   Procedure: MINOR REMOVAL OF A TUNNELED DIALYSIS CATHETER;  Surgeon: Oris Krystal FALCON, MD;  Location: [facility];  Service: Vascular;  Laterality: Right;   RIGHT/LEFT HEART CATH AND CORONARY ANGIOGRAPHY N/A 06/07/2024   Procedure: RIGHT/LEFT HEART CATH AND CORONARY ANGIOGRAPHY;  Surgeon: Verlin Lonni BIRCH, MD;  Location: [facility]  [facility];  Service: Cardiovascular;  Laterality: N/A;   ROBOT ASSISTED LAPAROSCOPIC NEPHRECTOMY Right 05/16/2020   Procedure: XI ROBOTIC ASSISTED LAPAROSCOPIC NEPHRECTOMY;  Surgeon: Sherrilee Belvie CROME, MD;  Location: [facility];  Service: Urology;  Laterality: Right;  2.5 hrs    Short Social History:  Social History   Tobacco Use   Smoking status: Former    Current packs/day: 0.00    Average packs/day: 0.5 packs/day for 20.0 years (10.0 ttl pk-yrs)    Types: Cigarettes    Start date: 02/12/1966    Quit date: 11/08/1984    Years since quitting: 39.6   Smokeless tobacco: Never  Substance Use Topics   Alcohol  use: No    Alcohol /week: 0.0 standard drinks of alcohol     No Known Allergies  No current facility-administered medications for this encounter.    REVIEW OF SYSTEMS All other systems were reviewed and are negative     Objective:   Objective   Vitals:   06/20/24 0717  BP: 128/76  Pulse: (!) 107  Resp: 14  SpO2: 99%   There is no height or weight on file to calculate BMI.  Physical Exam General: no acute distress Cardiac: hemodynamically stable Extremities: palpable thrill in left arm AVG  Data: Reviewed declot from February.  Venous anastomosis lesion treated with an 8 mm balloon after successful declot     Assessment/Plan:   JUNIOR HUEZO is a 78 y.o. male with ESRD presenting for fistulogram.  Having issues with change in bruit. Last HD session Monday. Reviewed risks and benefits of fistulogram with intervention and patient agreed to proceed.   Norman Serve, MD [facility]

## 2024-06-21 ENCOUNTER — Ambulatory Visit (INDEPENDENT_AMBULATORY_CARE_PROVIDER_SITE_OTHER): Admitting: Nurse Practitioner

## 2024-06-21 ENCOUNTER — Encounter: Payer: Self-pay | Admitting: Nurse Practitioner

## 2024-06-21 VITALS — BP 108/62 | Wt 240.8 lb

## 2024-06-21 DIAGNOSIS — E8779 Other fluid overload: Secondary | ICD-10-CM | POA: Diagnosis not present

## 2024-06-21 DIAGNOSIS — E0590 Thyrotoxicosis, unspecified without thyrotoxic crisis or storm: Secondary | ICD-10-CM

## 2024-06-21 DIAGNOSIS — N186 End stage renal disease: Secondary | ICD-10-CM | POA: Diagnosis not present

## 2024-06-21 DIAGNOSIS — E042 Nontoxic multinodular goiter: Secondary | ICD-10-CM | POA: Diagnosis not present

## 2024-06-21 DIAGNOSIS — Z992 Dependence on renal dialysis: Secondary | ICD-10-CM | POA: Diagnosis not present

## 2024-06-21 LAB — T4, FREE: Free T4: 1.07 ng/dL (ref 0.82–1.77)

## 2024-06-21 LAB — T3, FREE: T3, Free: 2.6 pg/mL (ref 2.0–4.4)

## 2024-06-21 LAB — TSH: TSH: 1.16 u[IU]/mL (ref 0.450–4.500)

## 2024-06-21 NOTE — Progress Notes (Signed)
 Endocrinology Follow Up Note 06/21/24    ---------------------------------------------------------------------------------------------------------------------- Subjective    Past Medical History:  Diagnosis Date   Arthritis    Cancer (HCC)    prostate   Cardiomyopathy    secondary   CKD (chronic kidney disease)    DM2 (diabetes mellitus, type 2) (HCC)    Gout    HTN (hypertension)    unspec   Hypercholesterolemia    Nonischemic cardiomyopathy (HCC)    a. EF 40-50% by most recent 2-D echo b. EF 25% by cardiac cath in 2004   Overweight(278.02)     Past Surgical History:  Procedure Laterality Date   A/V FISTULAGRAM Left 01/04/2024   Procedure: A/V Fistulagram;  Surgeon: Marea Selinda RAMAN, MD;  Location: [facility];  Service: Cardiovascular;  Laterality: Left;   A/V SHUNT INTERVENTION Left 06/20/2024   Procedure: A/V SHUNT INTERVENTION;  Surgeon: Pearline Norman RAMAN, MD;  Location: [facility];  Service: Cardiovascular;  Laterality: Left;   AV FISTULA PLACEMENT Left 06/06/2020   Procedure: LEFT ARM ARTERIOVENOUS (AV) GRAFT USING 45CM GORTEX;  Surgeon: Oris Krystal FALCON, MD;  [facility];  Service: Vascular;  Laterality: Left;   AV FISTULA PLACEMENT Left 06/08/2022   Procedure: INSERTION OF ARTERIOVENOUS GORE-TEX GRAFT UPPER ARM;  Surgeon: Oris Krystal FALCON, MD;  Location: [facility];  Service: Vascular;  Laterality: Left;   AVGG REMOVAL Left 07/27/2022   Procedure: EXCISION OF INFECTED PORTION OF LEFT FOREARM ARTERIOVENOUS GORETEX GRAFT (AVGG);  Surgeon: Oris Krystal FALCON, MD;  Location: [facility];  Service: Vascular;  Laterality: Left;   COLONOSCOPY     DIALYSIS/PERMA CATHETER INSERTION Right 01/04/2024   Procedure: DIALYSIS/PERMA CATHETER INSERTION;  Surgeon: Marea Selinda RAMAN, MD;  Location: [facility];  Service: Cardiovascular;  Laterality: Right;   HERNIA REPAIR     INSERTION OF DIALYSIS CATHETER Right 06/06/2020   Procedure: INSERTION OF DIALYSIS CATHETER USING 23CM DOUBLE LUMEN CATHETER;   Surgeon: Oris Krystal FALCON, MD;  [facility];  Service: Vascular;  Laterality: Right;   IR ANGIOGRAM EXTREMITY LEFT  10/25/2022   IR FLUORO GUIDE CV LINE RIGHT  05/20/2020   IR THROMBECTOMY AV FISTULA W/THROMBOLYSIS/PTA INC/SHUNT/IMG LEFT Left 10/25/2022   IR THROMBECTOMY AV FISTULA W/THROMBOLYSIS/PTA INC/SHUNT/IMG LEFT Left 08/05/2023   IR US  GUIDE VASC ACCESS LEFT  10/25/2022   IR US  GUIDE VASC ACCESS LEFT  08/09/2023   IR US  GUIDE VASC ACCESS RIGHT  05/20/2020   KNEE ARTHROPLASTY Left 08/08/2017   Procedure: LEFT TOTAL KNEE ARTHROPLASTY WITH COMPUTER NAVIGATION;  Surgeon: Fidel Rogue, MD;  [facility];  Service: Orthopedics;  Laterality: Left;  Needs RNFA   KNEE ARTHROPLASTY Right 11/17/2017   Procedure: RIGHT TOTAL KNEE ARTHROPLASTY WITH COMPUTER NAVIGATION;  Surgeon: Fidel Rogue, MD;  Location: [facility];  Service: Orthopedics;  Laterality: Right;  NEEDS RNFA   PERIPHERAL VASCULAR BALLOON ANGIOPLASTY Left 01/10/2024   Procedure: PERIPHERAL VASCULAR BALLOON ANGIOPLASTY;  Surgeon: Melia Lynwood ORN, MD;  [facility];  Service: Cardiovascular;  Laterality: Left;  VA, intragraft, AA   PERIPHERAL VASCULAR THROMBECTOMY Left 08/22/2023   Procedure: PERIPHERAL VASCULAR THROMBECTOMY;  Surgeon: Marea Selinda RAMAN, MD;  Location: [facility];  Service: Cardiovascular;  Laterality: Left;   PERIPHERAL VASCULAR THROMBECTOMY N/A 01/10/2024   Procedure: PERIPHERAL VASCULAR THROMBECTOMY;  Surgeon: Melia Lynwood ORN, MD;  [facility];  Service: Cardiovascular;  Laterality: N/A;   REMOVAL OF A DIALYSIS CATHETER Right 09/07/2022   Procedure: MINOR REMOVAL  OF A TUNNELED DIALYSIS CATHETER;  Surgeon: Oris Krystal FALCON, MD;  Location: [facility];  Service: Vascular;  Laterality: Right;   RIGHT/LEFT HEART CATH AND CORONARY ANGIOGRAPHY N/A 06/07/2024   Procedure: RIGHT/LEFT HEART CATH AND CORONARY ANGIOGRAPHY;  Surgeon: Verlin Lonni BIRCH, MD;  [facility];  Service:  Cardiovascular;  Laterality: N/A;   ROBOT ASSISTED LAPAROSCOPIC NEPHRECTOMY Right 05/16/2020   Procedure: XI ROBOTIC ASSISTED LAPAROSCOPIC NEPHRECTOMY;  Surgeon: Sherrilee Belvie CROME, MD;  Location: [facility];  Service: Urology;  Laterality: Right;  2.5 hrs   VENOUS ANGIOPLASTY  06/20/2024   Procedure: VENOUS ANGIOPLASTY;  Surgeon: Pearline Norman RAMAN, MD;  Location: [facility];  Service: Cardiovascular;;  Mid-Intragraft    Social History   Socioeconomic History   Marital status: Married    Spouse name: Not on file   Number of children: 2   Years of education: Not on file   Highest education level: Not on file  Occupational History   Occupation: retired  Tobacco Use   Smoking status: Former    Current packs/day: 0.00    Average packs/day: 0.5 packs/day for 20.0 years (10.0 ttl pk-yrs)    Types: Cigarettes    Start date: 02/12/1966    Quit date: 11/08/1984    Years since quitting: 39.6   Smokeless tobacco: Never  Vaping Use   Vaping status: Never Used  Substance and Sexual Activity   Alcohol  use: No    Alcohol /week: 0.0 standard drinks of alcohol    Drug use: No   Sexual activity: Yes    Birth control/protection: None  Other Topics Concern   Not on file  Social History Narrative   Full time.    Social Drivers of Corporate investment banker Strain: Not on file  Food Insecurity: No Food Insecurity (01/03/2024)   Hunger Vital Sign    Worried About Running Out of Food in the Last Year: Never true    Ran Out of Food in the Last Year: Never true  Transportation Needs: No Transportation Needs (01/03/2024)   PRAPARE - Administrator, Civil Service (Medical): No    Lack of Transportation (Non-Medical): No  Physical Activity: Not on file  Stress: No Stress Concern Present (01/27/2021)   Received from [facility] - Occupational Stress Questionnaire    Feeling of Stress : Only a little  Social Connections: Socially Integrated (01/03/2024)    Social Connection and Isolation Panel    Frequency of Communication with Friends and Family: More than three times a week    Frequency of Social Gatherings with Friends and Family: More than three times a week    Attends Religious Services: More than 4 times per year    Active Member of [employer] or Organizations: Yes    Attends Engineer, structural: More than 4 times per year    Marital Status: Married  Catering manager Violence: Not At Risk (01/03/2024)   Humiliation, Afraid, Rape, and Kick questionnaire    Fear of Current or Ex-Partner: No    Emotionally Abused: No    Physically Abused: No    Sexually Abused: No    Current Outpatient Medications on File Prior to Visit  Medication Sig Dispense Refill   acetaminophen  (TYLENOL ) 500 MG tablet Take 500-1,000 mg by mouth every 6 (six) hours as needed for moderate pain.     albuterol  (VENTOLIN  HFA) 108 (90 Base) MCG/ACT inhaler Inhale 1-2 puffs into the lungs every  6 (six) hours as needed for wheezing or shortness of breath. 6.7 g 1   allopurinol  (ZYLOPRIM ) 300 MG tablet Take 300 mg by mouth daily.     atorvastatin  (LIPITOR) 40 MG tablet Take 1 tablet (40 mg total) by mouth daily. (Patient taking differently: Take 40 mg by mouth at bedtime.) 90 tablet 3   Azelastine HCl 137 MCG/SPRAY SOLN Place 1 spray into both nostrils daily.     B Complex-C-Folic Acid  (RENA-VITE RX) 1 MG TABS Take 1 tablet by mouth daily.     BREO ELLIPTA  100-25 MCG/ACT AEPB Inhale 1 puff into the lungs daily.     calcium  acetate (PHOSLO ) 667 MG capsule Take 3 capsules (2,001 mg total) by mouth 3 (three) times daily with meals. (Patient taking differently: Take 1,334 mg by mouth 3 (three) times daily with meals.) 180 capsule 1   cetirizine (ZYRTEC) 10 MG tablet Take 10 mg by mouth daily.     Cholecalciferol (VITAMIN D ) 50 MCG (2000 UT) tablet Take 1 tablet (2,000 Units total) by mouth daily. 30 tablet 0   clotrimazole-betamethasone  (LOTRISONE) cream Apply 1  Application topically daily as needed (Rash).     Colchicine  0.6 MG CAPS Take 1 capsule (0.6 mg total) by mouth 2 (two) times daily as needed. Home med. (Patient taking differently: Take 1 capsule by mouth 2 (two) times daily as needed (gout). Home med.)     hydrOXYzine (ATARAX) 25 MG tablet Take 25 mg by mouth every 6 (six) hours as needed for itching.     methocarbamol  (ROBAXIN ) 500 MG tablet Take 500 mg by mouth 3 (three) times a week. Take Sun, Tue, and Thurs (day before dialysis)     midodrine  (PROAMATINE ) 10 MG tablet TAKE 1 TABLET BY MOUTH THREE TIMES DAILY WITH MEALS 270 tablet 3   Multiple Vitamins-Minerals (CENTRUM SILVER 50+MEN) TABS Take 1 tablet by mouth every 30 (thirty) days.     Naphazoline HCl (CLEAR EYES OP) Place 1 drop into both eyes daily.     Polyethylene Glycol 3350  (MIRALAX  PO) Take 17 g by mouth daily as needed (constipation).     pramipexole (MIRAPEX) 0.5 MG tablet Take 0.5 mg by mouth at bedtime.     senna (SENOKOT) 8.6 MG tablet Take 1-2 tablets by mouth daily as needed for constipation.     tamsulosin  (FLOMAX ) 0.4 MG CAPS capsule Take 1 capsule (0.4 mg total) by mouth daily. 90 capsule 3   trolamine salicylate (ASPERCREME) 10 % cream Apply 1 Application topically as needed for muscle pain.     No current facility-administered medications on file prior to visit.      HPI   Todd Robertson is a 78 y.o.-year-old male, referred by his PCP, Dr. Maree, for follow up after initial evaluation for multinodular goiter.  He is accompanied by his wife today.  Thyroid  U/S: 07/07/23 Nodules 1 and 2 located within the right thyroid  lobe, meet criteria for FNA.  The above is in keeping with the ACR TI-RADS recommendations - J Am Coll Radiol 2017;14:587-595.   Electronically Signed   By: Aliene Lloyd M.D.   On: 07/07/2023 12:18 Narrative  CLINICAL DATA:  Thyromegaly  EXAM: THYROID  ULTRASOUND  TECHNIQUE: Ultrasound examination of the thyroid  gland and adjacent  soft tissues was performed.  COMPARISON:  None available.  FINDINGS: Parenchymal Echotexture: Mildly heterogenous  Isthmus: 1.0 cm  Right lobe: 6.4 x 4.2 x 5.5 cm  Left lobe: 5.7 x 1.8 x 2.7 cm  _________________________________________________________  Estimated  total number of nodules >/= 1 cm: 2  Number of spongiform nodules >/=  2 cm not described below (TR1): 0  Number of mixed cystic and solid nodules >/= 1.5 cm not described below (TR2): 0  _________________________________________________________  Nodule # 1:  Location: Right; superior  Maximum size: 3.1 cm; Other 2 dimensions: 1.2 x 2.3 cm  Composition: solid/almost completely solid (2)  Echogenicity: isoechoic (1)  Shape: not taller-than-wide (0)  Margins: ill-defined (0)  Echogenic foci: none (0)  ACR TI-RADS total points: 3.  ACR TI-RADS risk category: TR3 (3 points).  ACR TI-RADS recommendations:  **Given size (>/= 2.5 cm) and appearance, fine needle aspiration of this mildly suspicious nodule should be considered based on TI-RADS criteria.  _________________________________________________________  Nodule # 2:  Location: Right; mid  Maximum size: 4.7 cm; Other 2 dimensions: 2.6 x 4.0 cm  Composition: solid/almost completely solid (2)  Echogenicity: isoechoic (1)  Shape: not taller-than-wide (0)  Margins: ill-defined (0)  Echogenic foci: none (0)  ACR TI-RADS total points: 3.  ACR TI-RADS risk category: TR3 (3 points).  ACR TI-RADS recommendations:  **Given size (>/= 2.5 cm) and appearance, fine needle aspiration of this mildly suspicious nodule should be considered based on TI-RADS criteria.  _________________________________________________________  Nodule 3: 0.9 x 0.7 x 0.9 cm predominantly cystic left superior thyroid  nodule does not meet criteria for imaging surveillance or FNA. Procedure Note  Mir, Aliene Senior, MD - 07/07/2023 Formatting of this note might  be different from the original. CLINICAL DATA:  Thyromegaly  EXAM: THYROID  ULTRASOUND  TECHNIQUE: Ultrasound examination of the thyroid  gland and adjacent soft tissues was performed.  COMPARISON:  None available.  FINDINGS: Parenchymal Echotexture: Mildly heterogenous  Isthmus: 1.0 cm  Right lobe: 6.4 x 4.2 x 5.5 cm  Left lobe: 5.7 x 1.8 x 2.7 cm  _________________________________________________________  Estimated total number of nodules >/= 1 cm: 2  Number of spongiform nodules >/=  2 cm not described below (TR1): 0  Number of mixed cystic and solid nodules >/= 1.5 cm not described below (TR2): 0  _________________________________________________________  Nodule # 1:  Location: Right; superior  Maximum size: 3.1 cm; Other 2 dimensions: 1.2 x 2.3 cm  Composition: solid/almost completely solid (2)  Echogenicity: isoechoic (1)  Shape: not taller-than-wide (0)  Margins: ill-defined (0)  Echogenic foci: none (0)  ACR TI-RADS total points: 3.  ACR TI-RADS risk category: TR3 (3 points).  ACR TI-RADS recommendations:  **Given size (>/= 2.5 cm) and appearance, fine needle aspiration of this mildly suspicious nodule should be considered based on TI-RADS criteria.  _________________________________________________________  Nodule # 2:  Location: Right; mid  Maximum size: 4.7 cm; Other 2 dimensions: 2.6 x 4.0 cm  Composition: solid/almost completely solid (2)  Echogenicity: isoechoic (1)  Shape: not taller-than-wide (0)  Margins: ill-defined (0)  Echogenic foci: none (0)  ACR TI-RADS total points: 3.  ACR TI-RADS risk category: TR3 (3 points).  ACR TI-RADS recommendations:  **Given size (>/= 2.5 cm) and appearance, fine needle aspiration of this mildly suspicious nodule should be considered based on TI-RADS criteria.  _________________________________________________________  Nodule 3: 0.9 x 0.7 x 0.9 cm predominantly cystic left  superior thyroid  nodule does not meet criteria for imaging surveillance or FNA.  IMPRESSION: Nodules 1 and 2 located within the right thyroid  lobe, meet criteria for FNA.  The above is in keeping with the ACR TI-RADS recommendations - J Am Coll Radiol 2017;14:587-595.   Electronically Signed   By: Aliene Katha HERO.D.  On: 07/07/2023 12:18 Exam End: 07/07/23 11:57   Specimen Collected: 07/07/23 12:13 Last Resulted: 07/07/23 12:18  Received From: [facility]  Result Received: 07/12/23 07:20    I reviewed pt's thyroid  tests: Lab Results  Component Value Date   TSH 1.160 06/20/2024   TSH 0.212 (L) 01/24/2024   TSH 0.53 10/27/2023   FREET4 1.07 06/20/2024   FREET4 1.08 01/24/2024     Pt c/o: - fatigue - SOB with exertion - hoarseness  He does have FH of thyroid  disease in his sister (hypothyroidism). No FH of thyroid  cancer. No h/o radiation tx to head or neck.  He did have radiation treatment for prostate cancer in the past.  No seaweed or kelp. No recent contrast studies. No steroid use. No herbal supplements. No Biotin supplements or Hair, Skin and Nails vitamins.  Pt also has a history of cardiomyopathy, Afib, DM2, OA, ESRD, renal cell carcinoma, anemia, COPD.  Review of systems  Constitutional: + Minimally fluctuating body weight,  current Body mass index is 30.92 kg/m. , + fatigue, no subjective hyperthermia, no subjective hypothermia Eyes: no blurry vision, no xerophthalmia ENT: no sore throat, no nodules palpated in throat, no dysphagia/odynophagia, + hoarseness Cardiovascular: no chest pain, + shortness of breath-Hx cardiomyopathy, no palpitations, no leg swelling, has HD access in left arm Respiratory: no cough, + shortness of breath (hx COPD), intermittent wheezes Gastrointestinal: no nausea/vomiting/diarrhea Musculoskeletal: no muscle/joint aches, walks with cane Skin: no rashes, no hyperemia Neurological: no tremors, no numbness, no tingling, no  dizziness Psychiatric: no depression, no anxiety  ---------------------------------------------------------------------------------------------------------------------- Objective    BP 108/62 (BP Location: Left Arm, Patient Position: Sitting)   Wt 240 lb 12.8 oz (109.2 kg)   BMI 30.92 kg/m    BP Readings from Last 3 Encounters:  06/21/24 108/62  06/20/24 103/65  06/07/24 90/63    Wt Readings from Last 3 Encounters:  06/21/24 240 lb 12.8 oz (109.2 kg)  06/07/24 245 lb (111.1 kg)  05/30/24 245 lb 12.8 oz (111.5 kg)    Physical Exam- Limited  Constitutional:  Body mass index is 30.92 kg/m. , not in acute distress, normal state of mind Eyes:  EOMI, no exophthalmos Musculoskeletal: no gross deformities, strength intact in all four extremities, no gross restriction of joint movements Skin:  no rashes, no hyperemia Neurological: no tremor with outstretched hands   Thyroid  US  from 01/02/24 CLINICAL DATA:  Multinodular goiter   EXAM: THYROID  ULTRASOUND   TECHNIQUE: Ultrasound examination of the thyroid  gland and adjacent soft tissues was performed.   COMPARISON:  07/07/2023   FINDINGS: Parenchymal Echotexture: Markedly heterogenous   Isthmus: 1.2 cm thickness, previously 1   Right lobe: 6.6 x 4.3 x 4.5 cm, previously 6.4 x 4.2 x 5.5   Left lobe: 4.6 x 1.8 x 2.2 cm, previously 5.7 x 1.8 x 2.7   _________________________________________________________   Estimated total number of nodules >/= 1 cm: 2   Number of spongiform nodules >/=  2 cm not described below (TR1): 0   Number of mixed cystic and solid nodules >/= 1.5 cm not described below (TR2): 0   _________________________________________________________   Nodule # 1:   Prior biopsy: No   Location: Right; mid   Maximum size: 4.2 cm; Other 2 dimensions: 3 x 4 cm, previously, 4.7 x 2.6 x 4 cm   Composition: solid/almost completely solid (2)   Echogenicity: isoechoic (1)   Shape: not taller-than-wide  (0)   Margins: ill-defined (0)   Echogenic foci: none (0)  ACR TI-RADS total points: 3.   ACR TI-RADS risk category:  TR 3.   Significant change in size (>/= 20% in two dimensions and minimal increase of 2 mm): No   Change in features: No   Change in ACR TI-RADS risk category: No   ACR TI-RADS recommendations:   **Given size (>/= 2.5 cm) and appearance, fine needle aspiration of this mildly suspicious nodule should be considered based on TI-RADS criteria.   _________________________________________________________   Nodule # 2:   Location: Right; inferior   Maximum size: 3.2 cm; Other 2 dimensions: 1.9 x 3 cm   Composition: solid/almost completely solid (2)   Echogenicity: isoechoic (1)   Shape: not taller-than-wide (0)   Margins: ill-defined (0)   Echogenic foci: none (0)   ACR TI-RADS total points: 3.   ACR TI-RADS risk category: TR 3.   ACR TI-RADS recommendations:   **Given size (>/= 2.5 cm) and appearance, fine needle aspiration of this mildly suspicious nodule should be considered based on TI-RADS criteria.   _________________________________________________________   Nodule # 3: 0.9 cm complex cyst, superior left, stable; This nodule does NOT meet TI-RADS criteria for biopsy or dedicated follow-up.   No regional cervical adenopathy identified.   IMPRESSION: 1. Stable multinodular goiter. 2. Nodules 1 and 2 meet criteria for FNA.  For   The above is in keeping with the ACR TI-RADS recommendations - J Am Coll Radiol 2017;14:587-595.     Electronically Signed   By: JONETTA Faes M.D.   On: 01/08/2024 09:55      Latest Reference Range & Units 10/27/23 00:00 01/24/24 14:57 06/20/24 14:33  TSH 0.450 - 4.500 uIU/mL 0.53 (E) 0.212 (L) 1.160  Triiodothyronine,Free,Serum 2.0 - 4.4 pg/mL  2.7 2.6  T4,Free(Direct) 0.82 - 1.77 ng/dL  8.91 8.92  Thyroperoxidase Ab SerPl-aCnc 0 - 34 IU/mL  17   Thyroglobulin Antibody 0.0 - 0.9 IU/mL  <1.0   (L):  Data is abnormally low (E): External lab result ----------------------------------------------------------------------------------------------------------------------  ASSESSMENT / PLAN:  1. Thyroid  Nodule 2. Subclinical hyperthyroidism-resolved  He did have FNA of suspicious thyroid  nodules in RML and RLL both of which pathology confirms to be benign follicular nodule.  Will plan to repeat thyroid  ultrasound in 1 year for surveillance, due around February 2026.  Will order for this to be done prior to next visit.  His thyroid  antibody testing was negative, ruling out autoimmune thyroid  dysfunction.    His repeat thyroid  function tests were normal.    He does NOT need antithyroid treatment or thyroid  hormone replacement therapy.  Will recheck TFTs in 6 months for surveillance.   Follow Up Plan: Return in about 6 months (around 12/22/2024) for Thyroid  follow up, Previsit labs, thyroid  ultrasound.      I spent  23  minutes in the care of the patient today including review of labs from Thyroid  Function, CMP, and other relevant labs ; imaging/biopsy records (current and previous including abstractions from other facilities); face-to-face time discussing  his lab results and symptoms, medications doses, his options of short and long term treatment based on the latest standards of care / guidelines;   and documenting the encounter.  Todd Robertson  participated in the discussions, expressed understanding, and voiced agreement with the above plans.  All questions were answered to his satisfaction. he is encouraged to contact clinic should he have any questions or concerns prior to his return visit.   Todd Rio, FNP-BC [facility] [address] [place],  [place] [address] Phone: [phone] Fax: [phone]

## 2024-06-22 ENCOUNTER — Telehealth: Payer: Self-pay | Admitting: Student

## 2024-06-22 DIAGNOSIS — D631 Anemia in chronic kidney disease: Secondary | ICD-10-CM | POA: Diagnosis not present

## 2024-06-22 DIAGNOSIS — Z992 Dependence on renal dialysis: Secondary | ICD-10-CM | POA: Diagnosis not present

## 2024-06-22 DIAGNOSIS — N250 Renal osteodystrophy: Secondary | ICD-10-CM | POA: Diagnosis not present

## 2024-06-22 DIAGNOSIS — N186 End stage renal disease: Secondary | ICD-10-CM | POA: Diagnosis not present

## 2024-06-22 DIAGNOSIS — N2581 Secondary hyperparathyroidism of renal origin: Secondary | ICD-10-CM | POA: Diagnosis not present

## 2024-06-22 DIAGNOSIS — D509 Iron deficiency anemia, unspecified: Secondary | ICD-10-CM | POA: Diagnosis not present

## 2024-06-22 DIAGNOSIS — I5020 Unspecified systolic (congestive) heart failure: Secondary | ICD-10-CM

## 2024-06-22 NOTE — Telephone Encounter (Signed)
 Patient's wife is returning call.

## 2024-06-22 NOTE — Telephone Encounter (Signed)
   Please let the patient know that I reviewed with Dr. Alvan and he did recommend referral to our Advanced Heart Failure team to see if they recommend any medication adjustments since this is limited given his hypotension and kidney disease. If he is in agreement with this, please enter a referral to the AHF Clinic.  Signed, Laymon CHRISTELLA Qua, PA-C 06/22/2024, 1:46 PM Pager: [phone]

## 2024-06-22 NOTE — Telephone Encounter (Signed)
 Called pt. No answer. Left msg to call back.

## 2024-06-23 DIAGNOSIS — D509 Iron deficiency anemia, unspecified: Secondary | ICD-10-CM | POA: Diagnosis not present

## 2024-06-23 DIAGNOSIS — N2581 Secondary hyperparathyroidism of renal origin: Secondary | ICD-10-CM | POA: Diagnosis not present

## 2024-06-23 DIAGNOSIS — D631 Anemia in chronic kidney disease: Secondary | ICD-10-CM | POA: Diagnosis not present

## 2024-06-23 DIAGNOSIS — N186 End stage renal disease: Secondary | ICD-10-CM | POA: Diagnosis not present

## 2024-06-23 DIAGNOSIS — N250 Renal osteodystrophy: Secondary | ICD-10-CM | POA: Diagnosis not present

## 2024-06-23 DIAGNOSIS — Z992 Dependence on renal dialysis: Secondary | ICD-10-CM | POA: Diagnosis not present

## 2024-06-25 DIAGNOSIS — D509 Iron deficiency anemia, unspecified: Secondary | ICD-10-CM | POA: Diagnosis not present

## 2024-06-25 DIAGNOSIS — N2581 Secondary hyperparathyroidism of renal origin: Secondary | ICD-10-CM | POA: Diagnosis not present

## 2024-06-25 DIAGNOSIS — N186 End stage renal disease: Secondary | ICD-10-CM | POA: Diagnosis not present

## 2024-06-25 DIAGNOSIS — N250 Renal osteodystrophy: Secondary | ICD-10-CM | POA: Diagnosis not present

## 2024-06-25 DIAGNOSIS — Z992 Dependence on renal dialysis: Secondary | ICD-10-CM | POA: Diagnosis not present

## 2024-06-25 DIAGNOSIS — D631 Anemia in chronic kidney disease: Secondary | ICD-10-CM | POA: Diagnosis not present

## 2024-06-25 NOTE — Telephone Encounter (Signed)
 Wife Alveta) returned RN's regarding referral to AHF Clinic.

## 2024-06-25 NOTE — Telephone Encounter (Signed)
 I spoke with wife and she is agreeable to consult with Advanced heart failure team,referral placed.

## 2024-06-25 NOTE — Addendum Note (Signed)
 Addended by: Shabana Armentrout A on: 06/25/2024 04:39 PM   Modules accepted: Orders

## 2024-06-26 DIAGNOSIS — E1140 Type 2 diabetes mellitus with diabetic neuropathy, unspecified: Secondary | ICD-10-CM | POA: Diagnosis not present

## 2024-06-26 DIAGNOSIS — B351 Tinea unguium: Secondary | ICD-10-CM | POA: Diagnosis not present

## 2024-06-26 DIAGNOSIS — L110 Acquired keratosis follicularis: Secondary | ICD-10-CM | POA: Diagnosis not present

## 2024-06-26 DIAGNOSIS — E1151 Type 2 diabetes mellitus with diabetic peripheral angiopathy without gangrene: Secondary | ICD-10-CM | POA: Diagnosis not present

## 2024-06-27 ENCOUNTER — Encounter: Payer: Self-pay | Admitting: Urology

## 2024-06-27 ENCOUNTER — Ambulatory Visit (INDEPENDENT_AMBULATORY_CARE_PROVIDER_SITE_OTHER): Payer: Medicare Other | Admitting: Urology

## 2024-06-27 VITALS — BP 105/64 | HR 99

## 2024-06-27 DIAGNOSIS — N401 Enlarged prostate with lower urinary tract symptoms: Secondary | ICD-10-CM

## 2024-06-27 DIAGNOSIS — Z992 Dependence on renal dialysis: Secondary | ICD-10-CM | POA: Diagnosis not present

## 2024-06-27 DIAGNOSIS — N186 End stage renal disease: Secondary | ICD-10-CM | POA: Diagnosis not present

## 2024-06-27 DIAGNOSIS — D631 Anemia in chronic kidney disease: Secondary | ICD-10-CM | POA: Diagnosis not present

## 2024-06-27 DIAGNOSIS — Z85528 Personal history of other malignant neoplasm of kidney: Secondary | ICD-10-CM

## 2024-06-27 DIAGNOSIS — C61 Malignant neoplasm of prostate: Secondary | ICD-10-CM | POA: Diagnosis not present

## 2024-06-27 DIAGNOSIS — N250 Renal osteodystrophy: Secondary | ICD-10-CM | POA: Diagnosis not present

## 2024-06-27 DIAGNOSIS — R3912 Poor urinary stream: Secondary | ICD-10-CM

## 2024-06-27 DIAGNOSIS — N138 Other obstructive and reflux uropathy: Secondary | ICD-10-CM

## 2024-06-27 DIAGNOSIS — N2581 Secondary hyperparathyroidism of renal origin: Secondary | ICD-10-CM | POA: Diagnosis not present

## 2024-06-27 DIAGNOSIS — D509 Iron deficiency anemia, unspecified: Secondary | ICD-10-CM | POA: Diagnosis not present

## 2024-06-27 MED ORDER — TAMSULOSIN HCL 0.4 MG PO CAPS
0.4000 mg | ORAL_CAPSULE | Freq: Every day | ORAL | 3 refills | Status: DC
Start: 2024-06-27 — End: 2024-08-15

## 2024-06-27 NOTE — Patient Instructions (Signed)

## 2024-06-27 NOTE — Progress Notes (Signed)
 06/27/2024 3:11 PM   Todd Robertson 09-Feb-1946 [id number]  Referring provider: Maree Isles, MD [address] [place],  [place] [address]  Prostate cancer and BPH   HPI: Todd Robertson is a 78yo here for followup for BPH and prostate cancer. PSA stable at 0.8. IPSS 2 QOL 0 on flomax . He is on dialysis and urinates once a day.  He had IMRT in 2022.   PMH: Past Medical History:  Diagnosis Date   Arthritis    Cancer (HCC)    prostate   Cardiomyopathy    secondary   CKD (chronic kidney disease)    DM2 (diabetes mellitus, type 2) (HCC)    Gout    HTN (hypertension)    unspec   Hypercholesterolemia    Nonischemic cardiomyopathy (HCC)    a. EF 40-50% by most recent 2-D echo b. EF 25% by cardiac cath in 2004   Overweight(278.02)     Surgical History: Past Surgical History:  Procedure Laterality Date   A/V FISTULAGRAM Left 01/04/2024   Procedure: A/V Fistulagram;  Surgeon: Marea Selinda RAMAN, MD;  Location: [facility];  Service: Cardiovascular;  Laterality: Left;   A/V SHUNT INTERVENTION Left 06/20/2024   Procedure: A/V SHUNT INTERVENTION;  Surgeon: Pearline Norman RAMAN, MD;  Location: [facility];  Service: Cardiovascular;  Laterality: Left;   AV FISTULA PLACEMENT Left 06/06/2020   Procedure: LEFT ARM ARTERIOVENOUS (AV) GRAFT USING 45CM GORTEX;  Surgeon: Oris Krystal FALCON, MD;  [facility];  Service: Vascular;  Laterality: Left;   AV FISTULA PLACEMENT Left 06/08/2022   Procedure: INSERTION OF ARTERIOVENOUS GORE-TEX GRAFT UPPER ARM;  Surgeon: Oris Krystal FALCON, MD;  Location: [facility];  Service: Vascular;  Laterality: Left;   AVGG REMOVAL Left 07/27/2022   Procedure: EXCISION OF INFECTED PORTION OF LEFT FOREARM ARTERIOVENOUS GORETEX GRAFT (AVGG);  Surgeon: Oris Krystal FALCON, MD;  Location: [facility];  Service: Vascular;  Laterality: Left;   COLONOSCOPY     DIALYSIS/PERMA CATHETER INSERTION Right 01/04/2024   Procedure: DIALYSIS/PERMA CATHETER INSERTION;  Surgeon: Marea Selinda RAMAN, MD;  Location: [facility]  [facility];  Service: Cardiovascular;  Laterality: Right;   HERNIA REPAIR     INSERTION OF DIALYSIS CATHETER Right 06/06/2020   Procedure: INSERTION OF DIALYSIS CATHETER USING 23CM DOUBLE LUMEN CATHETER;  Surgeon: Oris Krystal FALCON, MD;  [facility];  Service: Vascular;  Laterality: Right;   IR ANGIOGRAM EXTREMITY LEFT  10/25/2022   IR FLUORO GUIDE CV LINE RIGHT  05/20/2020   IR THROMBECTOMY AV FISTULA W/THROMBOLYSIS/PTA INC/SHUNT/IMG LEFT Left 10/25/2022   IR THROMBECTOMY AV FISTULA W/THROMBOLYSIS/PTA INC/SHUNT/IMG LEFT Left 08/05/2023   IR US  GUIDE VASC ACCESS LEFT  10/25/2022   IR US  GUIDE VASC ACCESS LEFT  08/09/2023   IR US  GUIDE VASC ACCESS RIGHT  05/20/2020   KNEE ARTHROPLASTY Left 08/08/2017   Procedure: LEFT TOTAL KNEE ARTHROPLASTY WITH COMPUTER NAVIGATION;  Surgeon: Fidel Rogue, MD;  [facility];  Service: Orthopedics;  Laterality: Left;  Needs RNFA   KNEE ARTHROPLASTY Right 11/17/2017   Procedure: RIGHT TOTAL KNEE ARTHROPLASTY WITH COMPUTER NAVIGATION;  Surgeon: Fidel Rogue, MD;  Location: [facility];  Service: Orthopedics;  Laterality: Right;  NEEDS RNFA   PERIPHERAL VASCULAR BALLOON ANGIOPLASTY Left 01/10/2024   Procedure: PERIPHERAL VASCULAR BALLOON ANGIOPLASTY;  Surgeon: Melia Lynwood ORN, MD;  [facility];  Service: Cardiovascular;  Laterality: Left;  VA, intragraft, AA   PERIPHERAL VASCULAR THROMBECTOMY Left 08/22/2023   Procedure: PERIPHERAL VASCULAR THROMBECTOMY;  Surgeon: Marea Selinda RAMAN, MD;  Location: [facility];  Service: Cardiovascular;  Laterality: Left;   PERIPHERAL VASCULAR THROMBECTOMY N/A 01/10/2024   Procedure: PERIPHERAL VASCULAR THROMBECTOMY;  Surgeon: Melia Lynwood ORN, MD;  [facility];  Service: Cardiovascular;  Laterality: N/A;   REMOVAL OF A DIALYSIS CATHETER Right 09/07/2022   Procedure: MINOR REMOVAL OF A TUNNELED DIALYSIS CATHETER;  Surgeon: Oris Krystal FALCON, MD;  Location: [facility];  Service: Vascular;  Laterality: Right;    RIGHT/LEFT HEART CATH AND CORONARY ANGIOGRAPHY N/A 06/07/2024   Procedure: RIGHT/LEFT HEART CATH AND CORONARY ANGIOGRAPHY;  Surgeon: Verlin Lonni BIRCH, MD;  [facility];  Service: Cardiovascular;  Laterality: N/A;   ROBOT ASSISTED LAPAROSCOPIC NEPHRECTOMY Right 05/16/2020   Procedure: XI ROBOTIC ASSISTED LAPAROSCOPIC NEPHRECTOMY;  Surgeon: Sherrilee Todd CROME, MD;  Location: [facility];  Service: Urology;  Laterality: Right;  2.5 hrs   VENOUS ANGIOPLASTY  06/20/2024   Procedure: VENOUS ANGIOPLASTY;  Surgeon: Pearline Norman RAMAN, MD;  Location: [facility];  Service: Cardiovascular;;  Mid-Intragraft    Home Medications:  Allergies as of 06/27/2024   No Known Allergies      Medication List        Accurate as of June 27, 2024  3:11 PM. If you have any questions, ask your nurse or doctor.          acetaminophen  500 MG tablet Commonly known as: TYLENOL  Take 500-1,000 mg by mouth every 6 (six) hours as needed for moderate pain.   albuterol  108 (90 Base) MCG/ACT inhaler Commonly known as: VENTOLIN  HFA Inhale 1-2 puffs into the lungs every 6 (six) hours as needed for wheezing or shortness of breath.   allopurinol  300 MG tablet Commonly known as: ZYLOPRIM  Take 300 mg by mouth daily.   atorvastatin  40 MG tablet Commonly known as: LIPITOR Take 1 tablet (40 mg total) by mouth daily. What changed: when to take this   Azelastine HCl 137 MCG/SPRAY Soln Place 1 spray into both nostrils daily.   Breo Ellipta  100-25 MCG/ACT Aepb Generic drug: fluticasone  furoate-vilanterol Inhale 1 puff into the lungs daily.   calcium  acetate 667 MG capsule Commonly known as: PHOSLO  Take 3 capsules (2,001 mg total) by mouth 3 (three) times daily with meals. What changed: how much to take   Centrum Silver 50+Men Tabs Take 1 tablet by mouth every 30 (thirty) days.   cetirizine 10 MG tablet Commonly known as: ZYRTEC Take 10 mg by mouth daily.   CLEAR EYES OP Place 1 drop into both  eyes daily.   clotrimazole-betamethasone  cream Commonly known as: LOTRISONE Apply 1 Application topically daily as needed (Rash).   Colchicine  0.6 MG Caps Take 1 capsule (0.6 mg total) by mouth 2 (two) times daily as needed. Home med. What changed: reasons to take this   hydrOXYzine 25 MG tablet Commonly known as: ATARAX Take 25 mg by mouth every 6 (six) hours as needed for itching.   methocarbamol  500 MG tablet Commonly known as: ROBAXIN  Take 500 mg by mouth 3 (three) times a week. Take Sun, Tue, and Thurs (day before dialysis)   midodrine  10 MG tablet Commonly known as: PROAMATINE  TAKE 1 TABLET BY MOUTH THREE TIMES DAILY WITH MEALS   MIRALAX  PO Take 17 g by mouth daily as needed (constipation).   pramipexole 0.5 MG tablet Commonly known as: MIRAPEX Take 0.5 mg by mouth at bedtime.   Rena-Vite Rx 1 MG Tabs Take 1 tablet by mouth daily.  senna 8.6 MG tablet Commonly known as: SENOKOT Take 1-2 tablets by mouth daily as needed for constipation.   tamsulosin  0.4 MG Caps capsule Commonly known as: FLOMAX  Take 1 capsule (0.4 mg total) by mouth daily.   trolamine salicylate 10 % cream Commonly known as: ASPERCREME Apply 1 Application topically as needed for muscle pain.   Vitamin D  50 MCG (2000 UT) tablet Take 1 tablet (2,000 Units total) by mouth daily.        Allergies: No Known Allergies  Family History: Family History  Problem Relation Age of Onset   Hypertension Mother    Kidney disease Mother    Heart disease Father    Kidney disease Other     Social History:  reports that he quit smoking about 39 years ago. His smoking use included cigarettes. He started smoking about 58 years ago. He has a 10 pack-year smoking history. He has never used smokeless tobacco. He reports that he does not drink alcohol  and does not use drugs.  ROS: All other review of systems were reviewed and are negative except what is noted above in HPI  Physical Exam: BP 105/64    Pulse 99   Constitutional:  Alert and oriented, No acute distress. HEENT: [place] AT, moist mucus membranes.  Trachea midline, no masses. Cardiovascular: No clubbing, cyanosis, or edema. Respiratory: Normal respiratory effort, no increased work of breathing. GI: Abdomen is soft, nontender, nondistended, no abdominal masses GU: No CVA tenderness.  Lymph: No cervical or inguinal lymphadenopathy. Skin: No rashes, bruises or suspicious lesions. Neurologic: Grossly intact, no focal deficits, moving all 4 extremities. Psychiatric: Normal mood and affect.  Laboratory Data: Lab Results  Component Value Date   WBC 7.0 05/30/2024   HGB 10.2 (L) 06/07/2024   HCT 30.0 (L) 06/07/2024   MCV 100.9 (H) 05/30/2024   PLT 192 05/30/2024    Lab Results  Component Value Date   CREATININE 7.21 (H) 05/30/2024    No results found for: PSA  No results found for: TESTOSTERONE  Lab Results  Component Value Date   HGBA1C 6.2 (H) 05/13/2020    Urinalysis    Component Value Date/Time   APPEARANCEUR Clear 12/30/2022 1104   GLUCOSEU Negative 12/30/2022 1104   BILIRUBINUR Negative 12/30/2022 1104   PROTEINUR 2+ (A) 12/30/2022 1104   UROBILINOGEN 0.2 02/13/2020 1122   NITRITE Negative 12/30/2022 1104   LEUKOCYTESUR Negative 12/30/2022 1104    Lab Results  Component Value Date   LABMICR See below: 12/30/2022   WBCUA 6-10 (A) 12/30/2022   LABEPIT 0-10 12/30/2022   MUCUS Present (A) 12/30/2022   BACTERIA None seen 12/30/2022    Pertinent Imaging:  Results for orders placed during the hospital encounter of 06/10/20  DG Abd 1 View  Narrative CLINICAL DATA:  Abdominal distention.  EXAM: ABDOMEN - 1 VIEW  COMPARISON:  05/30/2020  FINDINGS: Nasogastric tube has been removed. Bowel gas pattern is nonobstructed. No evidence for free intraperitoneal air or organomegaly.  IMPRESSION: Nonobstructive bowel gas pattern.   Electronically Signed By: Almarie Daring M.D. On: 06/14/2020  10:14  No results found for this or any previous visit.  No results found for this or any previous visit.  No results found for this or any previous visit.  Results for orders placed during the hospital encounter of 12/27/23  US  RENAL  Narrative : PROCEDURE: US  RENAL  HISTORY: Patient is a 78 y/o M with history of right nephrectomy 05/16/2020 due to cancer. On dialysis. Hypertension. Diabetes. Chronic  kidney disease.  COMPARISON: CT renal 08/19/2021, U/S renal 02/02/2021.  TECHNIQUE: Two-dimensional grayscale and color Doppler ultrasound of the kidneys was performed.  FINDINGS: The urinary bladder is partially distended.  The right kidney is surgically absent. The right renal fossa appears unremarkable.  The left kidney measures 10.0 x 5.4 x 4.6 cm. Renal cortical echotexture is mildly increased. Calcifications are noted within the cortex. There is no hydronephrosis. There are no stones. There are multiple simple cysts with the largest measuring 2.4 cm at the medial inferior pole.  IMPRESSION: 1.  Surgically absent right kidney.  2. Multiple left renal cysts. Mild increase in echogenicity of the left kidney, consistent with medical renal disease.  Thank you for allowing us  to assist in the care of this patient.   Electronically Signed By: Lynwood Mains M.D. On: 12/28/2023 12:27  No results found for this or any previous visit.  No results found for this or any previous visit.  Results for orders placed during the hospital encounter of 08/19/21  CT RENAL STONE STUDY  Narrative CLINICAL DATA:  Follow-up right-sided renal cell carcinoma, status post right nephrectomy, additional history of prostate cancer  EXAM: CT ABDOMEN AND PELVIS WITHOUT CONTRAST  TECHNIQUE: Multidetector CT imaging of the abdomen and pelvis was performed following the standard protocol without IV contrast.  COMPARISON:  05/28/2020  FINDINGS: Lower chest: No acute abnormality.   Coronary artery calcifications.  Hepatobiliary: No solid liver abnormality is seen. No gallstones, gallbladder wall thickening, or biliary dilatation.  Pancreas: Unremarkable. No pancreatic ductal dilatation or surrounding inflammatory changes.  Spleen: Normal in size without significant abnormality.  Adrenals/Urinary Tract: Adrenal glands are unremarkable. Redemonstrated postoperative findings right nephrectomy. No evidence of recurrent soft tissue. Partially exophytic benign simple cyst of the left kidney, which is otherwise normal. No hydronephrosis. Thickening of the decompressed urinary bladder, likely related to chronic outlet obstruction.  Stomach/Bowel: Stomach is within normal limits. Appendix appears normal. No evidence of bowel wall thickening, distention, or inflammatory changes.  Vascular/Lymphatic: Aortic atherosclerosis. No enlarged abdominal or pelvic lymph nodes.  Reproductive: Biopsy marking clips in the prostate.  Other: No abdominal wall hernia or abnormality. No abdominopelvic ascites.  Musculoskeletal: No acute or significant osseous findings.  IMPRESSION: 1. Redemonstrated postoperative findings of right nephrectomy. No noncontrast evidence of recurrent soft tissue or metastatic disease in the abdomen or pelvis. 2. Thickening of the decompressed urinary bladder, likely related to chronic outlet obstruction. 3. Coronary artery disease.  Aortic Atherosclerosis (ICD10-I70.0).   Electronically Signed By: Marolyn JONETTA Jaksch M.D. On: 08/21/2021 09:00   Assessment & Plan:    1. Prostate cancer (HCC) (Primary) -followup 1 year with a PSA - Urinalysis, Routine w reflex microscopic  2. Weak urinary stream -continue flomax  0.4mg  daily  3. BPH with urinary obstruction -continue flomax  0.4mg  daily   No follow-ups on file.  Todd Clara, MD  [facility] [place]

## 2024-06-29 DIAGNOSIS — D509 Iron deficiency anemia, unspecified: Secondary | ICD-10-CM | POA: Diagnosis not present

## 2024-06-29 DIAGNOSIS — Z992 Dependence on renal dialysis: Secondary | ICD-10-CM | POA: Diagnosis not present

## 2024-06-29 DIAGNOSIS — N250 Renal osteodystrophy: Secondary | ICD-10-CM | POA: Diagnosis not present

## 2024-06-29 DIAGNOSIS — N2581 Secondary hyperparathyroidism of renal origin: Secondary | ICD-10-CM | POA: Diagnosis not present

## 2024-06-29 DIAGNOSIS — N186 End stage renal disease: Secondary | ICD-10-CM | POA: Diagnosis not present

## 2024-06-29 DIAGNOSIS — D631 Anemia in chronic kidney disease: Secondary | ICD-10-CM | POA: Diagnosis not present

## 2024-06-30 DIAGNOSIS — Z992 Dependence on renal dialysis: Secondary | ICD-10-CM | POA: Diagnosis not present

## 2024-06-30 DIAGNOSIS — E8779 Other fluid overload: Secondary | ICD-10-CM | POA: Diagnosis not present

## 2024-06-30 DIAGNOSIS — N186 End stage renal disease: Secondary | ICD-10-CM | POA: Diagnosis not present

## 2024-07-02 DIAGNOSIS — D509 Iron deficiency anemia, unspecified: Secondary | ICD-10-CM | POA: Diagnosis not present

## 2024-07-02 DIAGNOSIS — D631 Anemia in chronic kidney disease: Secondary | ICD-10-CM | POA: Diagnosis not present

## 2024-07-02 DIAGNOSIS — Z992 Dependence on renal dialysis: Secondary | ICD-10-CM | POA: Diagnosis not present

## 2024-07-02 DIAGNOSIS — N250 Renal osteodystrophy: Secondary | ICD-10-CM | POA: Diagnosis not present

## 2024-07-02 DIAGNOSIS — N2581 Secondary hyperparathyroidism of renal origin: Secondary | ICD-10-CM | POA: Diagnosis not present

## 2024-07-02 DIAGNOSIS — N186 End stage renal disease: Secondary | ICD-10-CM | POA: Diagnosis not present

## 2024-07-04 DIAGNOSIS — Z992 Dependence on renal dialysis: Secondary | ICD-10-CM | POA: Diagnosis not present

## 2024-07-04 DIAGNOSIS — D631 Anemia in chronic kidney disease: Secondary | ICD-10-CM | POA: Diagnosis not present

## 2024-07-04 DIAGNOSIS — N250 Renal osteodystrophy: Secondary | ICD-10-CM | POA: Diagnosis not present

## 2024-07-04 DIAGNOSIS — N186 End stage renal disease: Secondary | ICD-10-CM | POA: Diagnosis not present

## 2024-07-04 DIAGNOSIS — D509 Iron deficiency anemia, unspecified: Secondary | ICD-10-CM | POA: Diagnosis not present

## 2024-07-04 DIAGNOSIS — N2581 Secondary hyperparathyroidism of renal origin: Secondary | ICD-10-CM | POA: Diagnosis not present

## 2024-07-05 ENCOUNTER — Ambulatory Visit: Admitting: Emergency Medicine

## 2024-07-06 ENCOUNTER — Ambulatory Visit (HOSPITAL_COMMUNITY)
Admission: RE | Admit: 2024-07-06 | Discharge: 2024-07-06 | Disposition: A | Discharge status: Home / Self Care | Source: Ambulatory Visit | Attending: Cardiology | Admitting: Cardiology

## 2024-07-06 DIAGNOSIS — N250 Renal osteodystrophy: Secondary | ICD-10-CM | POA: Diagnosis not present

## 2024-07-06 DIAGNOSIS — Z85528 Personal history of other malignant neoplasm of kidney: Secondary | ICD-10-CM | POA: Insufficient documentation

## 2024-07-06 DIAGNOSIS — I132 Hypertensive heart and chronic kidney disease with heart failure and with stage 5 chronic kidney disease, or end stage renal disease: Secondary | ICD-10-CM | POA: Diagnosis not present

## 2024-07-06 DIAGNOSIS — E785 Hyperlipidemia, unspecified: Secondary | ICD-10-CM | POA: Insufficient documentation

## 2024-07-06 DIAGNOSIS — N186 End stage renal disease: Secondary | ICD-10-CM | POA: Diagnosis not present

## 2024-07-06 DIAGNOSIS — I493 Ventricular premature depolarization: Secondary | ICD-10-CM | POA: Diagnosis not present

## 2024-07-06 DIAGNOSIS — I5020 Unspecified systolic (congestive) heart failure: Secondary | ICD-10-CM | POA: Insufficient documentation

## 2024-07-06 DIAGNOSIS — Z992 Dependence on renal dialysis: Secondary | ICD-10-CM | POA: Diagnosis not present

## 2024-07-06 DIAGNOSIS — I452 Bifascicular block: Secondary | ICD-10-CM | POA: Diagnosis not present

## 2024-07-06 DIAGNOSIS — I959 Hypotension, unspecified: Secondary | ICD-10-CM | POA: Diagnosis not present

## 2024-07-06 DIAGNOSIS — D631 Anemia in chronic kidney disease: Secondary | ICD-10-CM | POA: Diagnosis not present

## 2024-07-06 DIAGNOSIS — N2581 Secondary hyperparathyroidism of renal origin: Secondary | ICD-10-CM | POA: Diagnosis not present

## 2024-07-06 DIAGNOSIS — D509 Iron deficiency anemia, unspecified: Secondary | ICD-10-CM | POA: Diagnosis not present

## 2024-07-06 MED ORDER — TORSEMIDE 20 MG PO TABS: 80.0000 mg | ORAL_TABLET | ORAL | 3 refills | Status: DC

## 2024-07-06 NOTE — Progress Notes (Signed)
   ADVANCED HEART FAILURE NEW PATIENT CLINIC NOTE  Referring Physician: Maree Isles, MD  Primary Care: Maree Isles, MD Primary Cardiologist:  HPI: Todd Robertson is a 78 y.o. male with a PMH of HFmrEF, ESRD on iHD, HTN, HLD, RCC who presents for initial visit for further evaluation and treatment of heart failure/cardiomyopathy.      Patient with prior history of RCC and eventual transition to iHD. He has known cardiomyopathy and unfortunately has had progressive worsening of his BP, functional status, and has had multiple issues with inability to remove adequate amount of volume during dialysis.     SUBJECTIVE:  Patient reports he continues to have worsening dyspnea on exertion that has really progressed over the past few months.  He recently underwent right and left heart catheterization that showed elevated biventricular filling pressures with wedge of 24 and RA of 15.  He has had to increase the frequency of his dialysis sessions and notes that he has had a change in his symptoms since a higher dry weight has been targeted.  He takes multiple pills of midodrine  in order to tolerate dialysis and his blood pressure still remains low during his session.  PMH, current medications, allergies, social history, and family history reviewed in epic.  PHYSICAL EXAM: Vitals:   07/06/24 1501  BP: (!) 100/56  Pulse: 93  SpO2: 96%   GENERAL: Chronically ill-appearing PULM:  Normal work of breathing, clear to auscultation bilaterally. Respirations are unlabored.  CARDIAC:  JVP: Mildly elevated         Normal rate with regular rhythm.  Systolic murmur.  1+ edema. Warm and well perfused extremities. ABDOMEN: Soft, non-tender, non-distended. NEUROLOGIC: Patient is oriented x3 with no focal or lateralizing neurologic deficits.   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  Based on his recent right heart catheterization and description of his symptoms, volume overload is likely the main driver of his dyspnea as his cardiac output is relatively well preserved.  - Any HF therapy is likely to limit his ability to tolerate iHD - Would not start any new HF medication at this time - Would continue to challenge dry weight, using midodrine  as needed - Given that he still makes some urine would start torsemide  80mg  on non-iHD days to assist with volume removal - Nothing to offer from a HF standpoint unfortunately, can continue follow up with general cardiology  ERSD:  - Follows with nephrology - Would recommend potentially increased frequency of sessions given clear volume overload and difficulty tolerating additional volume removal  Hypotension:  - Limits therapy  Follow up as needed  Morene Brownie, MD Advanced Heart Failure Mechanical Circulatory Support 07/10/24

## 2024-07-06 NOTE — Patient Instructions (Signed)
 START Torsemide  80 mg daily on non dialysis days.  Your physician recommends that you schedule a follow-up appointment in: as needed.  If you have any questions or concerns before your next appointment please send us  a message through [place] or call our office at [phone].    TO LEAVE A MESSAGE FOR THE NURSE SELECT OPTION 2, PLEASE LEAVE A MESSAGE INCLUDING: YOUR NAME DATE OF BIRTH CALL BACK NUMBER REASON FOR CALL**this is important as we prioritize the call backs  YOU WILL RECEIVE A CALL BACK THE SAME DAY AS LONG AS YOU CALL BEFORE 4:00 PM  At the [facility], you and your health needs are our priority. As part of our continuing mission to provide you with exceptional heart care, we have created designated Provider Care Teams. These Care Teams include your primary Cardiologist (physician) and Advanced Practice Providers (APPs- Physician Assistants and Nurse Practitioners) who all work together to provide you with the care you need, when you need it.   You may see any of the following providers on your designated Care Team at your next follow up: Dr Toribio Fuel Dr Ezra Shuck Dr. Ria Commander Dr. Morene Brownie Amy Lenetta, NP Caffie Shed, [place] [facility] [place], [place] Beckey Coe, NP [place] Lee, NP Ellouise Class, NP Tinnie Redman, PharmD Jaun Bash, PharmD   Please be sure to bring in all your medications bottles to every appointment.    Thank you for choosing [facility]

## 2024-07-08 DIAGNOSIS — Z992 Dependence on renal dialysis: Secondary | ICD-10-CM | POA: Diagnosis not present

## 2024-07-08 DIAGNOSIS — N186 End stage renal disease: Secondary | ICD-10-CM | POA: Diagnosis not present

## 2024-07-09 DIAGNOSIS — D631 Anemia in chronic kidney disease: Secondary | ICD-10-CM | POA: Diagnosis not present

## 2024-07-09 DIAGNOSIS — N250 Renal osteodystrophy: Secondary | ICD-10-CM | POA: Diagnosis not present

## 2024-07-09 DIAGNOSIS — D509 Iron deficiency anemia, unspecified: Secondary | ICD-10-CM | POA: Diagnosis not present

## 2024-07-09 DIAGNOSIS — N186 End stage renal disease: Secondary | ICD-10-CM | POA: Diagnosis not present

## 2024-07-09 DIAGNOSIS — N2581 Secondary hyperparathyroidism of renal origin: Secondary | ICD-10-CM | POA: Diagnosis not present

## 2024-07-09 DIAGNOSIS — Z992 Dependence on renal dialysis: Secondary | ICD-10-CM | POA: Diagnosis not present

## 2024-07-11 DIAGNOSIS — D631 Anemia in chronic kidney disease: Secondary | ICD-10-CM | POA: Diagnosis not present

## 2024-07-11 DIAGNOSIS — Z992 Dependence on renal dialysis: Secondary | ICD-10-CM | POA: Diagnosis not present

## 2024-07-11 DIAGNOSIS — D509 Iron deficiency anemia, unspecified: Secondary | ICD-10-CM | POA: Diagnosis not present

## 2024-07-11 DIAGNOSIS — N2581 Secondary hyperparathyroidism of renal origin: Secondary | ICD-10-CM | POA: Diagnosis not present

## 2024-07-11 DIAGNOSIS — N186 End stage renal disease: Secondary | ICD-10-CM | POA: Diagnosis not present

## 2024-07-12 ENCOUNTER — Ambulatory Visit: Admitting: Student

## 2024-07-13 DIAGNOSIS — N186 End stage renal disease: Secondary | ICD-10-CM | POA: Diagnosis not present

## 2024-07-13 DIAGNOSIS — D631 Anemia in chronic kidney disease: Secondary | ICD-10-CM | POA: Diagnosis not present

## 2024-07-13 DIAGNOSIS — N2581 Secondary hyperparathyroidism of renal origin: Secondary | ICD-10-CM | POA: Diagnosis not present

## 2024-07-13 DIAGNOSIS — D509 Iron deficiency anemia, unspecified: Secondary | ICD-10-CM | POA: Diagnosis not present

## 2024-07-13 DIAGNOSIS — Z992 Dependence on renal dialysis: Secondary | ICD-10-CM | POA: Diagnosis not present

## 2024-07-16 DIAGNOSIS — D631 Anemia in chronic kidney disease: Secondary | ICD-10-CM | POA: Diagnosis not present

## 2024-07-16 DIAGNOSIS — N2581 Secondary hyperparathyroidism of renal origin: Secondary | ICD-10-CM | POA: Diagnosis not present

## 2024-07-16 DIAGNOSIS — Z992 Dependence on renal dialysis: Secondary | ICD-10-CM | POA: Diagnosis not present

## 2024-07-16 DIAGNOSIS — N186 End stage renal disease: Secondary | ICD-10-CM | POA: Diagnosis not present

## 2024-07-16 DIAGNOSIS — D509 Iron deficiency anemia, unspecified: Secondary | ICD-10-CM | POA: Diagnosis not present

## 2024-07-18 DIAGNOSIS — N2581 Secondary hyperparathyroidism of renal origin: Secondary | ICD-10-CM | POA: Diagnosis not present

## 2024-07-18 DIAGNOSIS — N186 End stage renal disease: Secondary | ICD-10-CM | POA: Diagnosis not present

## 2024-07-18 DIAGNOSIS — D509 Iron deficiency anemia, unspecified: Secondary | ICD-10-CM | POA: Diagnosis not present

## 2024-07-18 DIAGNOSIS — Z992 Dependence on renal dialysis: Secondary | ICD-10-CM | POA: Diagnosis not present

## 2024-07-18 DIAGNOSIS — D631 Anemia in chronic kidney disease: Secondary | ICD-10-CM | POA: Diagnosis not present

## 2024-07-20 DIAGNOSIS — N186 End stage renal disease: Secondary | ICD-10-CM | POA: Diagnosis not present

## 2024-07-20 DIAGNOSIS — D631 Anemia in chronic kidney disease: Secondary | ICD-10-CM | POA: Diagnosis not present

## 2024-07-20 DIAGNOSIS — Z992 Dependence on renal dialysis: Secondary | ICD-10-CM | POA: Diagnosis not present

## 2024-07-20 DIAGNOSIS — N2581 Secondary hyperparathyroidism of renal origin: Secondary | ICD-10-CM | POA: Diagnosis not present

## 2024-07-20 DIAGNOSIS — D509 Iron deficiency anemia, unspecified: Secondary | ICD-10-CM | POA: Diagnosis not present

## 2024-07-22 DIAGNOSIS — Z992 Dependence on renal dialysis: Secondary | ICD-10-CM | POA: Diagnosis not present

## 2024-07-22 DIAGNOSIS — D631 Anemia in chronic kidney disease: Secondary | ICD-10-CM | POA: Diagnosis not present

## 2024-07-22 DIAGNOSIS — N186 End stage renal disease: Secondary | ICD-10-CM | POA: Diagnosis not present

## 2024-07-22 DIAGNOSIS — N2581 Secondary hyperparathyroidism of renal origin: Secondary | ICD-10-CM | POA: Diagnosis not present

## 2024-07-22 DIAGNOSIS — D509 Iron deficiency anemia, unspecified: Secondary | ICD-10-CM | POA: Diagnosis not present

## 2024-07-23 DIAGNOSIS — N186 End stage renal disease: Secondary | ICD-10-CM | POA: Diagnosis not present

## 2024-07-23 DIAGNOSIS — Z992 Dependence on renal dialysis: Secondary | ICD-10-CM | POA: Diagnosis not present

## 2024-07-23 DIAGNOSIS — D509 Iron deficiency anemia, unspecified: Secondary | ICD-10-CM | POA: Diagnosis not present

## 2024-07-23 DIAGNOSIS — N2581 Secondary hyperparathyroidism of renal origin: Secondary | ICD-10-CM | POA: Diagnosis not present

## 2024-07-23 DIAGNOSIS — D631 Anemia in chronic kidney disease: Secondary | ICD-10-CM | POA: Diagnosis not present

## 2024-07-24 DIAGNOSIS — Z1331 Encounter for screening for depression: Secondary | ICD-10-CM | POA: Diagnosis not present

## 2024-07-24 DIAGNOSIS — Z23 Encounter for immunization: Secondary | ICD-10-CM | POA: Diagnosis not present

## 2024-07-24 DIAGNOSIS — Z1339 Encounter for screening examination for other mental health and behavioral disorders: Secondary | ICD-10-CM | POA: Diagnosis not present

## 2024-07-24 DIAGNOSIS — Z299 Encounter for prophylactic measures, unspecified: Secondary | ICD-10-CM | POA: Diagnosis not present

## 2024-07-24 DIAGNOSIS — Z0000 Encounter for general adult medical examination without abnormal findings: Secondary | ICD-10-CM | POA: Diagnosis not present

## 2024-07-24 DIAGNOSIS — J449 Chronic obstructive pulmonary disease, unspecified: Secondary | ICD-10-CM | POA: Diagnosis not present

## 2024-07-24 DIAGNOSIS — Z7189 Other specified counseling: Secondary | ICD-10-CM | POA: Diagnosis not present

## 2024-07-24 DIAGNOSIS — I429 Cardiomyopathy, unspecified: Secondary | ICD-10-CM | POA: Diagnosis not present

## 2024-07-24 DIAGNOSIS — I10 Essential (primary) hypertension: Secondary | ICD-10-CM | POA: Diagnosis not present

## 2024-07-24 DIAGNOSIS — I959 Hypotension, unspecified: Secondary | ICD-10-CM | POA: Diagnosis not present

## 2024-07-25 DIAGNOSIS — D509 Iron deficiency anemia, unspecified: Secondary | ICD-10-CM | POA: Diagnosis not present

## 2024-07-25 DIAGNOSIS — N186 End stage renal disease: Secondary | ICD-10-CM | POA: Diagnosis not present

## 2024-07-25 DIAGNOSIS — Z992 Dependence on renal dialysis: Secondary | ICD-10-CM | POA: Diagnosis not present

## 2024-07-25 DIAGNOSIS — N2581 Secondary hyperparathyroidism of renal origin: Secondary | ICD-10-CM | POA: Diagnosis not present

## 2024-07-25 DIAGNOSIS — D631 Anemia in chronic kidney disease: Secondary | ICD-10-CM | POA: Diagnosis not present

## 2024-07-26 DIAGNOSIS — E8779 Other fluid overload: Secondary | ICD-10-CM | POA: Diagnosis not present

## 2024-07-26 DIAGNOSIS — Z992 Dependence on renal dialysis: Secondary | ICD-10-CM | POA: Diagnosis not present

## 2024-07-26 DIAGNOSIS — N186 End stage renal disease: Secondary | ICD-10-CM | POA: Diagnosis not present

## 2024-07-27 DIAGNOSIS — D509 Iron deficiency anemia, unspecified: Secondary | ICD-10-CM | POA: Diagnosis not present

## 2024-07-27 DIAGNOSIS — D631 Anemia in chronic kidney disease: Secondary | ICD-10-CM | POA: Diagnosis not present

## 2024-07-27 DIAGNOSIS — N2581 Secondary hyperparathyroidism of renal origin: Secondary | ICD-10-CM | POA: Diagnosis not present

## 2024-07-27 DIAGNOSIS — N186 End stage renal disease: Secondary | ICD-10-CM | POA: Diagnosis not present

## 2024-07-27 DIAGNOSIS — Z992 Dependence on renal dialysis: Secondary | ICD-10-CM | POA: Diagnosis not present

## 2024-07-30 DIAGNOSIS — N186 End stage renal disease: Secondary | ICD-10-CM | POA: Diagnosis not present

## 2024-07-30 DIAGNOSIS — D509 Iron deficiency anemia, unspecified: Secondary | ICD-10-CM | POA: Diagnosis not present

## 2024-07-30 DIAGNOSIS — E119 Type 2 diabetes mellitus without complications: Secondary | ICD-10-CM | POA: Diagnosis not present

## 2024-07-30 DIAGNOSIS — N2581 Secondary hyperparathyroidism of renal origin: Secondary | ICD-10-CM | POA: Diagnosis not present

## 2024-07-30 DIAGNOSIS — Z794 Long term (current) use of insulin: Secondary | ICD-10-CM | POA: Diagnosis not present

## 2024-07-30 DIAGNOSIS — Z992 Dependence on renal dialysis: Secondary | ICD-10-CM | POA: Diagnosis not present

## 2024-07-30 DIAGNOSIS — D631 Anemia in chronic kidney disease: Secondary | ICD-10-CM | POA: Diagnosis not present

## 2024-08-01 ENCOUNTER — Ambulatory Visit: Admitting: Emergency Medicine

## 2024-08-01 ENCOUNTER — Encounter: Payer: Self-pay | Admitting: Emergency Medicine

## 2024-08-01 VITALS — BP 116/62 | HR 91 | Ht 74.0 in | Wt 241.2 lb

## 2024-08-01 DIAGNOSIS — R0609 Other forms of dyspnea: Secondary | ICD-10-CM | POA: Diagnosis not present

## 2024-08-01 DIAGNOSIS — J449 Chronic obstructive pulmonary disease, unspecified: Secondary | ICD-10-CM

## 2024-08-01 DIAGNOSIS — Z992 Dependence on renal dialysis: Secondary | ICD-10-CM | POA: Diagnosis not present

## 2024-08-01 DIAGNOSIS — I429 Cardiomyopathy, unspecified: Secondary | ICD-10-CM | POA: Diagnosis not present

## 2024-08-01 DIAGNOSIS — D509 Iron deficiency anemia, unspecified: Secondary | ICD-10-CM | POA: Diagnosis not present

## 2024-08-01 DIAGNOSIS — N186 End stage renal disease: Secondary | ICD-10-CM

## 2024-08-01 DIAGNOSIS — N2581 Secondary hyperparathyroidism of renal origin: Secondary | ICD-10-CM | POA: Diagnosis not present

## 2024-08-01 DIAGNOSIS — R0602 Shortness of breath: Secondary | ICD-10-CM

## 2024-08-01 DIAGNOSIS — D631 Anemia in chronic kidney disease: Secondary | ICD-10-CM | POA: Diagnosis not present

## 2024-08-01 LAB — PULMONARY FUNCTION TEST
DL/VA % pred: 117 %
DL/VA: 4.58 ml/min/mmHg/L
DLCO unc % pred: 84 %
DLCO unc: 23.9 ml/min/mmHg
FEF 25-75 Post: 0.75 L/s
FEF 25-75 Pre: 0.49 L/s
FEF2575-%Change-Post: 53 %
FEF2575-%Pred-Post: 29 %
FEF2575-%Pred-Pre: 19 %
FEV1-%Change-Post: 18 %
FEV1-%Pred-Post: 39 %
FEV1-%Pred-Pre: 33 %
FEV1-Post: 1.4 L
FEV1-Pre: 1.19 L
FEV1FVC-%Change-Post: 7 %
FEV1FVC-%Pred-Pre: 76 %
FEV6-%Change-Post: 8 %
FEV6-%Pred-Post: 49 %
FEV6-%Pred-Pre: 45 %
FEV6-Post: 2.27 L
FEV6-Pre: 2.08 L
FEV6FVC-%Change-Post: -1 %
FEV6FVC-%Pred-Post: 101 %
FEV6FVC-%Pred-Pre: 103 %
FVC-%Change-Post: 10 %
FVC-%Pred-Post: 48 %
FVC-%Pred-Pre: 44 %
FVC-Post: 2.37 L
FVC-Pre: 2.15 L
Post FEV1/FVC ratio: 59 %
Post FEV6/FVC ratio: 96 %
Pre FEV1/FVC ratio: 55 %
Pre FEV6/FVC Ratio: 98 %
RV % pred: 151 %
RV: 4.24 L
TLC % pred: 85 %
TLC: 6.74 L

## 2024-08-01 MED ORDER — BREZTRI AEROSPHERE 160-9-4.8 MCG/ACT IN AERO: 2.0000 | INHALATION_SPRAY | Freq: Two times a day (BID) | RESPIRATORY_TRACT | 6 refills | Status: DC

## 2024-08-01 MED ORDER — BREZTRI AEROSPHERE 160-9-4.8 MCG/ACT IN AERO
2.0000 | INHALATION_SPRAY | Freq: Two times a day (BID) | RESPIRATORY_TRACT | Status: DC
Start: 2024-08-01 — End: 2024-08-15

## 2024-08-01 NOTE — Assessment & Plan Note (Signed)
 Volume status contributing to his dyspnea.  He has reviewed with nephrology and they are trying to optimize volume removal.  He is sometimes doing an extra day of dialysis for UF.

## 2024-08-01 NOTE — Progress Notes (Signed)
 Subjective:    Patient ID: Todd Robertson, male    DOB: 11/17/1945, 78 y.o.   MRN: [id number]  Shortness of Breath   ROV 08/01/2024 --78 year old male with end-stage renal disease, nonischemic cardiomyopathy, prostate CA, former tobacco use who carries a diagnosis of COPD.  I saw him in July for progressive shortness of breath and decreased functional capacity.  In part possibly related to his volume status and fluid retention (his dialysis dry weight had been adjusted).  He underwent pulmonary function testing today.  I continued him on Breo. Today he reports that he was recently seen in the advanced heart failure clinic, was started on torsemide  since he still makes a small amount of urine.  There was some discussion about possibly increasing his HD frequency since hypotension has made volume removal challenging.  At our original visit he was able to walk 1 lap, did not desaturate. He has good days and bad days. He is now doing some extra HD days from time to time > a few times a month.   Pulmonary function testing performed today and reviewed by me shows severe obstruction with a positive bronchodilator response, hyperinflated lung volumes, normal diffusion capacity   Echocardiogram: Left ventricular ejection fraction 35-40%, right heart normal size with elevated pressures (05/23/2024)  Review of Systems  Respiratory:  Positive for shortness of breath.    As per HPI  Past Medical History:  Diagnosis Date   Arthritis    Cancer (HCC)    prostate   Cardiomyopathy    secondary   CKD (chronic kidney disease)    DM2 (diabetes mellitus, type 2) (HCC)    Gout    HTN (hypertension)    unspec   Hypercholesterolemia    Nonischemic cardiomyopathy (HCC)    a. EF 40-50% by most recent 2-D echo b. EF 25% by cardiac cath in 2004   Overweight(278.02)      Family History  Problem Relation Age of Onset   Hypertension Mother    Kidney disease Mother    Heart disease Father    Kidney  disease Other      Social History   Socioeconomic History   Marital status: Married    Spouse name: Not on file   Number of children: 2   Years of education: Not on file   Highest education level: Not on file  Occupational History   Occupation: retired  Tobacco Use   Smoking status: Former    Current packs/day: 0.00    Average packs/day: 0.5 packs/day for 20.0 years (10.0 ttl pk-yrs)    Types: Cigarettes    Start date: 02/12/1966    Quit date: 11/08/1984    Years since quitting: 39.7   Smokeless tobacco: Never  Vaping Use   Vaping status: Never Used  Substance and Sexual Activity   Alcohol  use: No    Alcohol /week: 0.0 standard drinks of alcohol    Drug use: No   Sexual activity: Yes    Birth control/protection: None  Other Topics Concern   Not on file  Social History Narrative   Full time.    Social Drivers of Corporate investment banker Strain: Not on file  Food Insecurity: No Food Insecurity (01/03/2024)   Hunger Vital Sign    Worried About Running Out of Food in the Last Year: Never true    Ran Out of Food in the Last Year: Never true  Transportation Needs: No Transportation Needs (01/03/2024)   PRAPARE - Transportation  Lack of Transportation (Medical): No    Lack of Transportation (Non-Medical): No  Physical Activity: Not on file  Stress: No Stress Concern Present (01/27/2021)   Received from [facility] - Occupational Stress Questionnaire    Feeling of Stress : Only a little  Social Connections: Socially Integrated (01/03/2024)   Social Connection and Isolation Panel    Frequency of Communication with Friends and Family: More than three times a week    Frequency of Social Gatherings with Friends and Family: More than three times a week    Attends Religious Services: More than 4 times per year    Active Member of [employer] or Organizations: Yes    Attends Banker Meetings: More than 4 times per year     Marital Status: Married  Catering manager Violence: Not At Risk (01/03/2024)   Humiliation, Afraid, Rape, and Kick questionnaire    Fear of Current or Ex-Partner: No    Emotionally Abused: No    Physically Abused: No    Sexually Abused: No    - Tobacco: Former smoker, 0.5 ppd x 30 years (15 pack-years) - Employment: Research scientist (medical) - Exposures: Chemical exposures in the lab  No Known Allergies   Outpatient Medications Prior to Visit  Medication Sig Dispense Refill   acetaminophen  (TYLENOL ) 500 MG tablet Take 500-1,000 mg by mouth every 6 (six) hours as needed for moderate pain.     albuterol  (VENTOLIN  HFA) 108 (90 Base) MCG/ACT inhaler Inhale 1-2 puffs into the lungs every 6 (six) hours as needed for wheezing or shortness of breath. 6.7 g 1   allopurinol  (ZYLOPRIM ) 300 MG tablet Take 300 mg by mouth daily.     atorvastatin  (LIPITOR) 40 MG tablet Take 1 tablet (40 mg total) by mouth daily. 90 tablet 3   Azelastine HCl 137 MCG/SPRAY SOLN Place 1 spray into both nostrils daily.     B Complex-C-Folic Acid  (RENA-VITE RX) 1 MG TABS Take 1 tablet by mouth daily.     BREO ELLIPTA  100-25 MCG/ACT AEPB Inhale 1 puff into the lungs daily.     calcium  acetate (PHOSLO ) 667 MG capsule Take 3 capsules (2,001 mg total) by mouth 3 (three) times daily with meals. 180 capsule 1   cetirizine (ZYRTEC) 10 MG tablet Take 10 mg by mouth daily.     Cholecalciferol (VITAMIN D ) 50 MCG (2000 UT) tablet Take 1 tablet (2,000 Units total) by mouth daily. 30 tablet 0   clotrimazole-betamethasone  (LOTRISONE) cream Apply 1 Application topically daily as needed (Rash).     Colchicine  0.6 MG CAPS Take 1 capsule (0.6 mg total) by mouth 2 (two) times daily as needed. Home med.     hydrOXYzine (ATARAX) 25 MG tablet Take 25 mg by mouth every 6 (six) hours as needed for itching.     methocarbamol  (ROBAXIN ) 500 MG tablet Take 500 mg by mouth 3 (three) times a week. Take Sun, Tue, and Thurs (day before dialysis)     midodrine   (PROAMATINE ) 10 MG tablet TAKE 1 TABLET BY MOUTH THREE TIMES DAILY WITH MEALS 270 tablet 3   Multiple Vitamins-Minerals (CENTRUM SILVER 50+MEN) TABS Take 1 tablet by mouth every 30 (thirty) days.     Naphazoline HCl (CLEAR EYES OP) Place 1 drop into both eyes daily.     Polyethylene Glycol 3350  (MIRALAX  PO) Take 17 g by mouth daily as needed (constipation).     pramipexole (MIRAPEX) 0.5 MG tablet Take 0.5 mg by mouth  at bedtime.     senna (SENOKOT) 8.6 MG tablet Take 1-2 tablets by mouth daily as needed for constipation.     tamsulosin  (FLOMAX ) 0.4 MG CAPS capsule Take 1 capsule (0.4 mg total) by mouth daily. 90 capsule 3   torsemide  (DEMADEX ) 20 MG tablet Take 4 tablets (80 mg total) by mouth 3 (three) times a week. ON NON DIALYSIS DAYS 100 tablet 3   trolamine salicylate (ASPERCREME) 10 % cream Apply 1 Application topically as needed for muscle pain.     No facility-administered medications prior to visit.        Objective:   Physical Exam Vitals:   08/01/24 1510  BP: 116/62  Pulse: 91  SpO2: 96%  Weight: 241 lb 3.2 oz (109.4 kg)  Height: 6' 2 (1.88 m)   Gen: Pleasant, elderly gentleman in a wheelchair, in no distress,  normal affect  ENT: No lesions,  mouth clear,  oropharynx clear, no postnasal drip  Neck: No JVD, no stridor  Lungs: No use of accessory muscles, somewhat distant, some decreased breath sounds in both bases without crackles or wheezes  Cardiovascular: RRR, heart sounds normal, no murmur or gallops, 2+ pretibial and pedal edema  Musculoskeletal: No deformities, no cyanosis or clubbing  Neuro: alert, awake, non focal  Skin: Warm, no lesions or rash       Assessment & Plan:  Dyspnea on exertion Factorial dyspnea, clearly impacted by his renal function, volume status and interplay with his cardiac function.  He has done better since they have been more aggressive with volume removal but hypertension often limits this.  His pulmonary function testing today  would suggest that there is significant obstructive lung disease, COPD and asthma pattern and I think optimizing his therapy could be beneficial, help with his dyspnea.  COPD (chronic obstructive pulmonary disease) (HCC) We reviewed your pulmonary function testing today. We will try stopping Breo.  Start Breztri  2 puffs twice a day on a schedule.  Rinse and gargle after taking. Keep your albuterol  available to use 2 puffs if needed for shortness of breath, chest tightness, wheezing. Follow in our office in 3 months so we can review how your breathing is doing on the Breztri .  ESRD on dialysis (HCC) Volume status contributing to his dyspnea.  He has reviewed with nephrology and they are trying to optimize volume removal.  He is sometimes doing an extra day of dialysis for UF.  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  He does still make a little bit of urine torsemide  has been added.  Continue current cardiac meds.  Time spent 41 minutes reviewing his PFT, medications, dialysis plans, differential diagnosis and plans for therapy including changing his bronchodilators.  Todd Chris, MD, PhD 08/01/2024, 4:34 PM [facility] [phone] or if no answer before 7:00PM call [phone] For any issues after 7:00PM please call eLink [phone]

## 2024-08-01 NOTE — Assessment & Plan Note (Signed)
 We reviewed your pulmonary function testing today. We will try stopping Breo.  Start Breztri  2 puffs twice a day on a schedule.  Rinse and gargle after taking. Keep your albuterol  available to use 2 puffs if needed for shortness of breath, chest tightness, wheezing. Follow in our office in 3 months so we can review how your breathing is doing on the Breztri .

## 2024-08-01 NOTE — Assessment & Plan Note (Signed)
 Factorial dyspnea, clearly impacted by his renal function, volume status and interplay with his cardiac function.  He has done better since they have been more aggressive with volume removal but hypertension often limits this.  His pulmonary function testing today would suggest that there is significant obstructive lung disease, COPD and asthma pattern and I think optimizing his therapy could be beneficial, help with his dyspnea.

## 2024-08-01 NOTE — Patient Instructions (Addendum)
 We reviewed your pulmonary function testing today. We will try stopping Breo.  Start Breztri  2 puffs twice a day on a schedule.  Rinse and gargle after taking. Keep your albuterol  available to use 2 puffs if needed for shortness of breath, chest tightness, wheezing. We reviewed your dialysis routine and your fluid removal routine.  I am glad that they have adjusted your dry weight. Agree with following with cardiology and with your torsemide  as ordered Follow in our office in 3 months so we can review how your breathing is doing on the Breztri .

## 2024-08-01 NOTE — Progress Notes (Signed)
 Full PFT performed today.

## 2024-08-01 NOTE — Patient Instructions (Signed)
 Full PFT performed today.

## 2024-08-01 NOTE — Assessment & Plan Note (Signed)
 He has been seen by cardiology as requested.  He does still make a little bit of urine torsemide  has been added.  Continue current cardiac meds.

## 2024-08-03 DIAGNOSIS — Z992 Dependence on renal dialysis: Secondary | ICD-10-CM | POA: Diagnosis not present

## 2024-08-03 DIAGNOSIS — D631 Anemia in chronic kidney disease: Secondary | ICD-10-CM | POA: Diagnosis not present

## 2024-08-03 DIAGNOSIS — D509 Iron deficiency anemia, unspecified: Secondary | ICD-10-CM | POA: Diagnosis not present

## 2024-08-03 DIAGNOSIS — N2581 Secondary hyperparathyroidism of renal origin: Secondary | ICD-10-CM | POA: Diagnosis not present

## 2024-08-03 DIAGNOSIS — N186 End stage renal disease: Secondary | ICD-10-CM | POA: Diagnosis not present

## 2024-08-06 DIAGNOSIS — D631 Anemia in chronic kidney disease: Secondary | ICD-10-CM | POA: Diagnosis not present

## 2024-08-06 DIAGNOSIS — N186 End stage renal disease: Secondary | ICD-10-CM | POA: Diagnosis not present

## 2024-08-06 DIAGNOSIS — N2581 Secondary hyperparathyroidism of renal origin: Secondary | ICD-10-CM | POA: Diagnosis not present

## 2024-08-06 DIAGNOSIS — Z992 Dependence on renal dialysis: Secondary | ICD-10-CM | POA: Diagnosis not present

## 2024-08-06 DIAGNOSIS — D509 Iron deficiency anemia, unspecified: Secondary | ICD-10-CM | POA: Diagnosis not present

## 2024-08-07 DIAGNOSIS — N186 End stage renal disease: Secondary | ICD-10-CM | POA: Diagnosis not present

## 2024-08-07 DIAGNOSIS — Z992 Dependence on renal dialysis: Secondary | ICD-10-CM | POA: Diagnosis not present

## 2024-08-08 DIAGNOSIS — N2581 Secondary hyperparathyroidism of renal origin: Secondary | ICD-10-CM | POA: Diagnosis not present

## 2024-08-08 DIAGNOSIS — Z950 Presence of cardiac pacemaker: Secondary | ICD-10-CM | POA: Diagnosis not present

## 2024-08-08 DIAGNOSIS — Z992 Dependence on renal dialysis: Secondary | ICD-10-CM | POA: Diagnosis not present

## 2024-08-08 DIAGNOSIS — N186 End stage renal disease: Secondary | ICD-10-CM | POA: Diagnosis not present

## 2024-08-08 DIAGNOSIS — I469 Cardiac arrest, cause unspecified: Secondary | ICD-10-CM | POA: Diagnosis not present

## 2024-09-08 DEATH — deceased

## 2024-10-30 ENCOUNTER — Ambulatory Visit: Admitting: Adult Health

## 2025-01-01 ENCOUNTER — Ambulatory Visit: Admitting: Nurse Practitioner

## 2025-06-18 ENCOUNTER — Other Ambulatory Visit

## 2025-07-03 ENCOUNTER — Ambulatory Visit: Admitting: Urology
